# Patient Record
Sex: Female | Born: 1950
Health system: Southern US, Community
[De-identification: ages and names within clinical notes are randomized; demographics above are authoritative.]

## PROBLEM LIST (undated history)

## (undated) DIAGNOSIS — C73 Malignant neoplasm of thyroid gland: Secondary | ICD-10-CM

## (undated) DIAGNOSIS — I82409 Acute embolism and thrombosis of unspecified deep veins of unspecified lower extremity: Secondary | ICD-10-CM

## (undated) DIAGNOSIS — E890 Postprocedural hypothyroidism: Secondary | ICD-10-CM

## (undated) DIAGNOSIS — T4145XA Adverse effect of unspecified anesthetic, initial encounter: Secondary | ICD-10-CM

## (undated) DIAGNOSIS — I10 Essential (primary) hypertension: Secondary | ICD-10-CM

## (undated) DIAGNOSIS — M6281 Muscle weakness (generalized): Secondary | ICD-10-CM

## (undated) DIAGNOSIS — J1282 Pneumonia due to coronavirus disease 2019: Secondary | ICD-10-CM

## (undated) DIAGNOSIS — R001 Bradycardia, unspecified: Secondary | ICD-10-CM

## (undated) DIAGNOSIS — I2699 Other pulmonary embolism without acute cor pulmonale: Secondary | ICD-10-CM

## (undated) DIAGNOSIS — R609 Edema, unspecified: Secondary | ICD-10-CM

## (undated) DIAGNOSIS — R202 Paresthesia of skin: Secondary | ICD-10-CM

## (undated) DIAGNOSIS — M79609 Pain in unspecified limb: Secondary | ICD-10-CM

## (undated) DIAGNOSIS — G4733 Obstructive sleep apnea (adult) (pediatric): Principal | ICD-10-CM

## (undated) DIAGNOSIS — K5790 Diverticulosis of intestine, part unspecified, without perforation or abscess without bleeding: Secondary | ICD-10-CM

## (undated) DIAGNOSIS — U071 COVID-19: Secondary | ICD-10-CM

## (undated) DIAGNOSIS — K635 Polyp of colon: Secondary | ICD-10-CM

## (undated) DIAGNOSIS — T8859XA Other complications of anesthesia, initial encounter: Secondary | ICD-10-CM

## (undated) DIAGNOSIS — R200 Anesthesia of skin: Secondary | ICD-10-CM

## (undated) HISTORY — DX: Muscle weakness (generalized): M62.81

## (undated) HISTORY — DX: Postprocedural hypothyroidism: E89.0

## (undated) HISTORY — DX: Polyp of colon: K63.5

## (undated) HISTORY — PX: ANKLE SURGERY: SHX546

## (undated) HISTORY — DX: Pain in unspecified limb: M79.609

## (undated) HISTORY — DX: Acute embolism and thrombosis of unspecified deep veins of unspecified lower extremity: I82.409

## (undated) HISTORY — DX: Obstructive sleep apnea (adult) (pediatric): G47.33

## (undated) HISTORY — DX: Pneumonia due to coronavirus disease 2019: J12.82

## (undated) HISTORY — DX: COVID-19: U07.1

## (undated) HISTORY — DX: Anesthesia of skin: R20.0

## (undated) HISTORY — DX: Paresthesia of skin: R20.2

## (undated) HISTORY — DX: Malignant neoplasm of thyroid gland: C73

## (undated) HISTORY — PX: TOTAL THYROIDECTOMY: SHX2547

## (undated) HISTORY — DX: Essential (primary) hypertension: I10

## (undated) HISTORY — DX: Edema, unspecified: R60.9

## (undated) HISTORY — PX: KNEE SURGERY: SHX244

## (undated) HISTORY — DX: Other pulmonary embolism without acute cor pulmonale: I26.99

## (undated) HISTORY — PX: ABDOMINAL HYSTERECTOMY: SHX81

## (undated) HISTORY — PX: CERVICAL FUSION: SHX112

## (undated) HISTORY — DX: Diverticulosis of intestine, part unspecified, without perforation or abscess without bleeding: K57.90

---

## 1998-04-04 ENCOUNTER — Ambulatory Visit (HOSPITAL_COMMUNITY): Admission: RE | Admit: 1998-04-04 | Discharge: 1998-04-04 | Payer: Self-pay | Admitting: Family Medicine

## 1998-11-09 ENCOUNTER — Other Ambulatory Visit: Admission: RE | Admit: 1998-11-09 | Discharge: 1998-11-09 | Payer: Self-pay | Admitting: Family Medicine

## 1999-11-29 ENCOUNTER — Ambulatory Visit (HOSPITAL_COMMUNITY): Admission: RE | Admit: 1999-11-29 | Discharge: 1999-11-29 | Payer: Self-pay | Admitting: Family Medicine

## 1999-11-29 ENCOUNTER — Encounter: Payer: Self-pay | Admitting: Family Medicine

## 2000-06-29 ENCOUNTER — Ambulatory Visit (HOSPITAL_COMMUNITY): Admission: RE | Admit: 2000-06-29 | Discharge: 2000-06-29 | Payer: Self-pay | Admitting: Family Medicine

## 2000-06-29 ENCOUNTER — Encounter: Payer: Self-pay | Admitting: Family Medicine

## 2001-02-10 ENCOUNTER — Inpatient Hospital Stay (HOSPITAL_COMMUNITY): Admission: EM | Admit: 2001-02-10 | Discharge: 2001-02-11 | Payer: Self-pay | Admitting: Emergency Medicine

## 2001-02-10 ENCOUNTER — Encounter: Payer: Self-pay | Admitting: Emergency Medicine

## 2001-02-10 ENCOUNTER — Encounter: Payer: Self-pay | Admitting: Cardiology

## 2002-02-13 ENCOUNTER — Encounter: Payer: Self-pay | Admitting: Emergency Medicine

## 2002-02-13 ENCOUNTER — Emergency Department (HOSPITAL_COMMUNITY): Admission: EM | Admit: 2002-02-13 | Discharge: 2002-02-13 | Payer: Self-pay | Admitting: Emergency Medicine

## 2002-04-27 ENCOUNTER — Encounter (HOSPITAL_BASED_OUTPATIENT_CLINIC_OR_DEPARTMENT_OTHER): Payer: Self-pay | Admitting: General Surgery

## 2002-04-27 ENCOUNTER — Encounter: Admission: RE | Admit: 2002-04-27 | Discharge: 2002-04-27 | Payer: Self-pay | Admitting: General Surgery

## 2004-03-20 ENCOUNTER — Encounter: Admission: RE | Admit: 2004-03-20 | Discharge: 2004-03-20 | Payer: Self-pay | Admitting: Family Medicine

## 2004-05-21 ENCOUNTER — Encounter: Admission: RE | Admit: 2004-05-21 | Discharge: 2004-05-21 | Payer: Self-pay | Admitting: Orthopedic Surgery

## 2004-05-24 ENCOUNTER — Inpatient Hospital Stay (HOSPITAL_COMMUNITY): Admission: EM | Admit: 2004-05-24 | Discharge: 2004-05-25 | Payer: Self-pay | Admitting: Emergency Medicine

## 2004-07-06 ENCOUNTER — Ambulatory Visit: Payer: Self-pay

## 2004-12-31 LAB — HM COLONOSCOPY: HM Colonoscopy: NORMAL

## 2005-03-04 ENCOUNTER — Encounter: Admission: RE | Admit: 2005-03-04 | Discharge: 2005-03-04 | Payer: Self-pay | Admitting: Family Medicine

## 2005-04-23 ENCOUNTER — Encounter: Admission: RE | Admit: 2005-04-23 | Discharge: 2005-04-23 | Payer: Self-pay | Admitting: Neurosurgery

## 2005-05-08 ENCOUNTER — Encounter: Admission: RE | Admit: 2005-05-08 | Discharge: 2005-08-06 | Payer: Self-pay | Admitting: Neurosurgery

## 2005-08-13 ENCOUNTER — Emergency Department (HOSPITAL_COMMUNITY): Admission: EM | Admit: 2005-08-13 | Discharge: 2005-08-14 | Payer: Self-pay | Admitting: Emergency Medicine

## 2006-01-02 ENCOUNTER — Encounter: Admission: RE | Admit: 2006-01-02 | Discharge: 2006-01-02 | Payer: Self-pay | Admitting: Family Medicine

## 2006-04-10 ENCOUNTER — Encounter: Admission: RE | Admit: 2006-04-10 | Discharge: 2006-04-10 | Payer: Self-pay | Admitting: Orthopedic Surgery

## 2006-05-13 ENCOUNTER — Ambulatory Visit: Admission: RE | Admit: 2006-05-13 | Discharge: 2006-05-13 | Payer: Self-pay | Admitting: Orthopedic Surgery

## 2006-05-13 ENCOUNTER — Encounter: Payer: Self-pay | Admitting: Vascular Surgery

## 2007-01-19 ENCOUNTER — Encounter: Admission: RE | Admit: 2007-01-19 | Discharge: 2007-01-19 | Payer: Self-pay | Admitting: Family Medicine

## 2007-11-07 ENCOUNTER — Emergency Department (HOSPITAL_COMMUNITY): Admission: EM | Admit: 2007-11-07 | Discharge: 2007-11-08 | Payer: Self-pay | Admitting: Emergency Medicine

## 2007-11-12 ENCOUNTER — Encounter: Admission: RE | Admit: 2007-11-12 | Discharge: 2007-11-12 | Payer: Self-pay | Admitting: Family Medicine

## 2007-11-23 ENCOUNTER — Encounter: Payer: Self-pay | Admitting: Pulmonary Disease

## 2007-12-25 ENCOUNTER — Ambulatory Visit: Payer: Self-pay | Admitting: Pulmonary Disease

## 2007-12-25 DIAGNOSIS — R059 Cough, unspecified: Secondary | ICD-10-CM | POA: Insufficient documentation

## 2007-12-25 DIAGNOSIS — R05 Cough: Secondary | ICD-10-CM

## 2007-12-25 DIAGNOSIS — R0600 Dyspnea, unspecified: Secondary | ICD-10-CM | POA: Insufficient documentation

## 2007-12-25 DIAGNOSIS — R0602 Shortness of breath: Secondary | ICD-10-CM

## 2007-12-25 DIAGNOSIS — I10 Essential (primary) hypertension: Secondary | ICD-10-CM

## 2007-12-25 HISTORY — DX: Essential (primary) hypertension: I10

## 2007-12-25 HISTORY — DX: Cough, unspecified: R05.9

## 2007-12-28 ENCOUNTER — Ambulatory Visit: Payer: Self-pay | Admitting: Cardiovascular Disease

## 2007-12-28 ENCOUNTER — Ambulatory Visit: Payer: Self-pay | Admitting: Pulmonary Disease

## 2007-12-28 ENCOUNTER — Telehealth (INDEPENDENT_AMBULATORY_CARE_PROVIDER_SITE_OTHER): Payer: Self-pay | Admitting: *Deleted

## 2007-12-28 LAB — CONVERTED CEMR LAB
GFR calc Af Amer: 83 mL/min
GFR calc non Af Amer: 69 mL/min
Potassium: 4.2 meq/L (ref 3.5–5.1)
Sodium: 143 meq/L (ref 135–145)

## 2007-12-31 ENCOUNTER — Ambulatory Visit: Payer: Self-pay | Admitting: Pulmonary Disease

## 2008-01-01 ENCOUNTER — Ambulatory Visit (HOSPITAL_COMMUNITY): Admission: RE | Admit: 2008-01-01 | Discharge: 2008-01-01 | Payer: Self-pay | Admitting: Pulmonary Disease

## 2008-01-12 ENCOUNTER — Encounter: Payer: Self-pay | Admitting: Pulmonary Disease

## 2008-01-21 ENCOUNTER — Encounter: Payer: Self-pay | Admitting: Pulmonary Disease

## 2008-01-21 ENCOUNTER — Encounter: Admission: RE | Admit: 2008-01-21 | Discharge: 2008-01-21 | Payer: Self-pay | Admitting: Pulmonary Disease

## 2008-01-21 ENCOUNTER — Encounter (INDEPENDENT_AMBULATORY_CARE_PROVIDER_SITE_OTHER): Payer: Self-pay | Admitting: Interventional Radiology

## 2008-01-21 ENCOUNTER — Other Ambulatory Visit: Admission: RE | Admit: 2008-01-21 | Discharge: 2008-01-21 | Payer: Self-pay | Admitting: Interventional Radiology

## 2008-01-22 ENCOUNTER — Ambulatory Visit: Payer: Self-pay | Admitting: Cardiovascular Disease

## 2008-01-29 ENCOUNTER — Telehealth: Payer: Self-pay | Admitting: Pulmonary Disease

## 2008-02-02 ENCOUNTER — Ambulatory Visit: Payer: Self-pay

## 2008-02-02 ENCOUNTER — Encounter: Payer: Self-pay | Admitting: Cardiovascular Disease

## 2008-04-12 ENCOUNTER — Ambulatory Visit (HOSPITAL_COMMUNITY): Admission: RE | Admit: 2008-04-12 | Discharge: 2008-04-13 | Payer: Self-pay | Admitting: Surgery

## 2008-04-12 ENCOUNTER — Encounter (INDEPENDENT_AMBULATORY_CARE_PROVIDER_SITE_OTHER): Payer: Self-pay | Admitting: Surgery

## 2008-04-25 ENCOUNTER — Encounter: Payer: Self-pay | Admitting: Endocrinology

## 2008-05-04 ENCOUNTER — Ambulatory Visit: Payer: Self-pay | Admitting: Endocrinology

## 2008-05-04 DIAGNOSIS — C73 Malignant neoplasm of thyroid gland: Secondary | ICD-10-CM

## 2008-05-04 DIAGNOSIS — E890 Postprocedural hypothyroidism: Secondary | ICD-10-CM | POA: Insufficient documentation

## 2008-05-04 DIAGNOSIS — Z85850 Personal history of malignant neoplasm of thyroid: Secondary | ICD-10-CM | POA: Insufficient documentation

## 2008-05-04 HISTORY — DX: Postprocedural hypothyroidism: E89.0

## 2008-05-04 HISTORY — DX: Malignant neoplasm of thyroid gland: C73

## 2008-05-04 LAB — CONVERTED CEMR LAB: TSH: 35.38 microintl units/mL — ABNORMAL HIGH (ref 0.35–5.50)

## 2008-05-12 ENCOUNTER — Telehealth: Payer: Self-pay | Admitting: Endocrinology

## 2008-05-13 ENCOUNTER — Encounter (HOSPITAL_COMMUNITY): Admission: RE | Admit: 2008-05-13 | Discharge: 2008-05-13 | Payer: Self-pay | Admitting: Endocrinology

## 2008-05-16 ENCOUNTER — Telehealth: Payer: Self-pay | Admitting: Endocrinology

## 2008-05-23 ENCOUNTER — Ambulatory Visit: Payer: Self-pay | Admitting: Endocrinology

## 2008-05-23 ENCOUNTER — Telehealth (INDEPENDENT_AMBULATORY_CARE_PROVIDER_SITE_OTHER): Payer: Self-pay | Admitting: *Deleted

## 2008-05-23 DIAGNOSIS — R112 Nausea with vomiting, unspecified: Secondary | ICD-10-CM

## 2008-05-23 HISTORY — DX: Nausea with vomiting, unspecified: R11.2

## 2008-05-25 LAB — CONVERTED CEMR LAB
ALT: 25 units/L (ref 0–35)
Albumin: 4.1 g/dL (ref 3.5–5.2)
Amylase: 137 units/L — ABNORMAL HIGH (ref 27–131)
BUN: 15 mg/dL (ref 6–23)
Basophils Absolute: 0 10*3/uL (ref 0.0–0.1)
Calcium: 9.2 mg/dL (ref 8.4–10.5)
Chloride: 104 meq/L (ref 96–112)
Eosinophils Absolute: 0.2 10*3/uL (ref 0.0–0.7)
Eosinophils Relative: 3.1 % (ref 0.0–5.0)
HCT: 39.2 % (ref 36.0–46.0)
Lymphocytes Relative: 32.6 % (ref 12.0–46.0)
MCHC: 34.7 g/dL (ref 30.0–36.0)
MCV: 88.2 fL (ref 78.0–100.0)
Neutro Abs: 3.2 10*3/uL (ref 1.4–7.7)
Neutrophils Relative %: 54.9 % (ref 43.0–77.0)
Platelets: 237 10*3/uL (ref 150–400)
RDW: 14.6 % (ref 11.5–14.6)
Total Bilirubin: 0.8 mg/dL (ref 0.3–1.2)
Total Protein: 7.3 g/dL (ref 6.0–8.3)
WBC: 5.8 10*3/uL (ref 4.5–10.5)

## 2008-05-27 ENCOUNTER — Ambulatory Visit: Payer: Self-pay | Admitting: Pulmonary Disease

## 2008-06-07 ENCOUNTER — Telehealth (INDEPENDENT_AMBULATORY_CARE_PROVIDER_SITE_OTHER): Payer: Self-pay | Admitting: *Deleted

## 2008-06-15 ENCOUNTER — Ambulatory Visit: Payer: Self-pay | Admitting: Endocrinology

## 2008-07-15 ENCOUNTER — Encounter: Payer: Self-pay | Admitting: Endocrinology

## 2008-11-22 ENCOUNTER — Ambulatory Visit: Payer: Self-pay | Admitting: Endocrinology

## 2008-11-22 LAB — CONVERTED CEMR LAB: TSH: 0.05 microintl units/mL — ABNORMAL LOW (ref 0.35–5.50)

## 2008-11-25 ENCOUNTER — Telehealth: Payer: Self-pay | Admitting: Endocrinology

## 2008-12-05 DIAGNOSIS — R209 Unspecified disturbances of skin sensation: Secondary | ICD-10-CM | POA: Insufficient documentation

## 2008-12-05 HISTORY — DX: Unspecified disturbances of skin sensation: R20.9

## 2008-12-08 ENCOUNTER — Telehealth (INDEPENDENT_AMBULATORY_CARE_PROVIDER_SITE_OTHER): Payer: Self-pay | Admitting: *Deleted

## 2008-12-09 ENCOUNTER — Ambulatory Visit: Payer: Self-pay

## 2008-12-09 ENCOUNTER — Ambulatory Visit: Payer: Self-pay | Admitting: Internal Medicine

## 2008-12-09 DIAGNOSIS — M79609 Pain in unspecified limb: Secondary | ICD-10-CM

## 2008-12-09 HISTORY — DX: Pain in unspecified limb: M79.609

## 2008-12-14 ENCOUNTER — Ambulatory Visit: Payer: Self-pay | Admitting: Endocrinology

## 2008-12-22 DIAGNOSIS — R609 Edema, unspecified: Secondary | ICD-10-CM

## 2008-12-22 DIAGNOSIS — R600 Localized edema: Secondary | ICD-10-CM

## 2008-12-22 HISTORY — DX: Edema, unspecified: R60.9

## 2008-12-22 HISTORY — DX: Localized edema: R60.0

## 2009-01-06 ENCOUNTER — Ambulatory Visit: Payer: Self-pay | Admitting: Endocrinology

## 2009-01-07 LAB — CONVERTED CEMR LAB: TSH: 38.29 microintl units/mL — ABNORMAL HIGH (ref 0.35–5.50)

## 2009-01-09 ENCOUNTER — Encounter (HOSPITAL_COMMUNITY): Admission: RE | Admit: 2009-01-09 | Discharge: 2009-03-01 | Payer: Self-pay | Admitting: Endocrinology

## 2009-01-20 ENCOUNTER — Telehealth (INDEPENDENT_AMBULATORY_CARE_PROVIDER_SITE_OTHER): Payer: Self-pay | Admitting: *Deleted

## 2009-01-21 ENCOUNTER — Ambulatory Visit: Payer: Self-pay | Admitting: Family Medicine

## 2009-01-21 DIAGNOSIS — J309 Allergic rhinitis, unspecified: Secondary | ICD-10-CM | POA: Insufficient documentation

## 2009-02-07 ENCOUNTER — Telehealth (INDEPENDENT_AMBULATORY_CARE_PROVIDER_SITE_OTHER): Payer: Self-pay | Admitting: *Deleted

## 2009-02-07 ENCOUNTER — Ambulatory Visit: Payer: Self-pay | Admitting: Endocrinology

## 2009-03-16 ENCOUNTER — Telehealth: Payer: Self-pay | Admitting: Endocrinology

## 2009-12-14 ENCOUNTER — Ambulatory Visit: Payer: Self-pay | Admitting: Endocrinology

## 2009-12-14 LAB — CONVERTED CEMR LAB: TSH: 0.48 microintl units/mL (ref 0.35–5.50)

## 2010-01-17 ENCOUNTER — Encounter: Payer: Self-pay | Admitting: Endocrinology

## 2010-02-16 ENCOUNTER — Telehealth: Payer: Self-pay | Admitting: Endocrinology

## 2010-02-16 ENCOUNTER — Ambulatory Visit: Payer: Self-pay | Admitting: Endocrinology

## 2010-02-19 ENCOUNTER — Telehealth: Payer: Self-pay | Admitting: Endocrinology

## 2010-02-20 ENCOUNTER — Ambulatory Visit: Payer: Self-pay | Admitting: Endocrinology

## 2010-02-20 LAB — CONVERTED CEMR LAB: TSH: 0.13 microintl units/mL — ABNORMAL LOW (ref 0.35–5.50)

## 2010-05-18 ENCOUNTER — Encounter: Admission: RE | Admit: 2010-05-18 | Discharge: 2010-05-18 | Payer: Self-pay | Admitting: Internal Medicine

## 2010-09-23 ENCOUNTER — Encounter: Payer: Self-pay | Admitting: Orthopedic Surgery

## 2010-10-02 NOTE — Assessment & Plan Note (Signed)
Summary: FU D/T--- STC   Vital Signs:  Patient profile:   60 year old female Height:      69 inches (175.26 cm) Weight:      274.38 pounds (124.72 kg) BMI:     40.67 O2 Sat:      94 % on Room air Temp:     97.0 degrees F (36.11 degrees C) oral Pulse rate:   74 / minute BP sitting:   122 / 62  (left arm) Cuff size:   large  Vitals Entered By: Josph Macho RMA (December 14, 2009 11:36 AM)  O2 Flow:  Room air CC: follow-up visit/ CF   Referring Provider:  Thayer Headings Primary Provider:  Pecola Leisure  CC:  follow-up visit/ CF.  History of Present Illness: pt says she never misses her synthroid 137 micrograms/day.  she has fatigue. pt says she has few weeks of a slight "sensation" in the anterior neck.  no associated pain.  Current Medications (verified): 1)  Cartia Xt 180 Mg  Cp24 (Diltiazem Hcl Coated Beads) .... Take 1 Tablet By Mouth Once A Day 2)  Omeprazole 40 Mg  Cpdr (Omeprazole) .... One Am and Pm. 3)  Benazepril-Hydrochlorothiazide 20-25 Mg Tabs (Benazepril-Hydrochlorothiazide) .... Take 1 By Mouth Qd 4)  Menopause Trio 40-500-100 Mg Cr-Tabs (Black Cohosh-Flaxseed-Soy) .... Take 2 By Mouth Qd 5)  The Very Finest Fish Oil  Liqd (Omega-3 Fatty Acids) .... Take Qd 6)  Zofran Odt 4 Mg Tbdp (Ondansetron) .Marland Kitchen.. 1 Q4h As Needed Nausea 7)  Levothyroxine Sodium 137 Mcg Tabs (Levothyroxine Sodium) .Marland Kitchen.. 1 Qd  Allergies (verified): 1)  ! Codeine  Past History:  Past Medical History: Last updated: 02/07/2009 DISTURBANCE OF SKIN SENSATION (ICD-782.0) LEG PAIN, LEFT (ICD-729.5) NAUSEA AND VOMITING (ICD-787.01) HYPOTHYROIDISM, POSTSURGICAL (ICD-244.0) CARCINOMA, THYROID GLAND, PAPILLARY (ICD-193, stage 2) 8/09: thyroidectomy for 2.7 cm papillary adenocarcinoma (t2 n0 mo) 9/09: i-131 rx, [facility] 5/10: tg is neg (ab neg) 5/10: total body scan is neg DYSPNEA (ICD-786.05) COUGH (ICD-786.2) HYPERTENSION (ICD-401.9)  Review of Systems       denies dysphagia and sob  Physical  Exam  General:  morbidly obese.   Neck:  several healed surgical scars are present.  i do not appreciate a nodule in the thyroid or elsewhere in the neck  Additional Exam:  Thyroglobulin Antibody           36.1 U/mL                   0.0-60.0 Thyroglobulin             <0.2 ng/mL  FastTSH                   0.48 uIU/mL    Impression & Recommendations:  Problem # 1:  HYPOTHYROIDISM, POSTSURGICAL (ICD-244.0) needs increased rx, in view of #3  Problem # 2:  DISTURBANCE OF SKIN SENSATION (ICD-782.0) uncertain etiology.  it is probably mild residual numbness from her surgery  Problem # 3:  CARCINOMA, THYROID GLAND, PAPILLARY (ICD-193) stage 2, no evidence of recurrence  Medications Added to Medication List This Visit: 1)  Levothyroxine Sodium 150 Mcg Tabs (Levothyroxine sodium) .Marland Kitchen.. 1 once daily  Other Orders: T-Thyroglobulin Panel (09811, [phone]) TLB-TSH (Thyroid Stimulating Hormone) (84443-TSH) Est. Patient Level IV (13086)  Patient Instructions: 1)  blood tests today. 2)  tests are being ordered for you today.  a few days after the test(s), please call [phone] to hear your test results. 3)  pending the test  results, please continue the same medications for now. 4)  return 1 year if blood tests are good. 5)  (update: i left message on phone-tree:  increase synthroid to 150/day). Prescriptions: LEVOTHYROXINE SODIUM 150 MCG TABS (LEVOTHYROXINE SODIUM) 1 once daily  #90 x 3   Entered and Authorized by:   Minus Breeding MD   Signed by:   Minus Breeding MD on 12/15/2009   Method used:   Electronically to        [facility]  [address] [phone]* (retail)       [address]       [place], [place]  [id number]       Ph: [phone] or [phone]       Fax: [phone]   RxID:   [id number]

## 2010-10-02 NOTE — Progress Notes (Signed)
Summary: RESULTS  Phone Note Call from Patient   Summary of Call: Patient is requesting results of labs as soon as complete she feels that she needs change in dose.  Initial call taken by: Lamar Sprinkles, CMA,  February 16, 2010 3:08 PM  Follow-up for Phone Call        i'll leave result on phone-tree Follow-up by: Minus Breeding MD,  February 16, 2010 3:14 PM  Additional Follow-up for Phone Call Additional follow up Details #1::        Pt informed, she is very anxious to get results and new rx if indicated. Additional Follow-up by: Lamar Sprinkles, CMA,  February 16, 2010 5:18 PM    Additional Follow-up for Phone Call Additional follow up Details #2::    i left message on phone tree please continue same synthroid Follow-up by: Minus Breeding MD,  February 17, 2010 10:22 AM

## 2010-10-02 NOTE — Progress Notes (Signed)
Summary: Lab?  Phone Note Call from Patient Call back at Home Phone [phone]   Caller: Patient Summary of Call: pt informed to continue same thyroid medication but she states that she feels very tired and sluggish. Pt is requesting MD look recheck lab results for any possible indication that adjustment is needed. Initial call taken by: Margaret Pyle, CMA,  February 19, 2010 3:53 PM  Follow-up for Phone Call        ok tsh 244.0 Follow-up by: Minus Breeding MD,  February 19, 2010 3:54 PM  Additional Follow-up for Phone Call Additional follow up Details #1::        pt informed and labs scheduled for 06.21. Additional Follow-up by: Margaret Pyle, CMA,  February 19, 2010 4:34 PM

## 2010-10-02 NOTE — Progress Notes (Signed)
Summary: TSH?  Phone Note Call from Patient Call back at Home Phone [phone]   Caller: Patient Summary of Call: Pt called stating that she has been feeling tired and sluggish since thyroid medication dosage was changed. Pt is requesting another blood test if appropriate or an adjustment or medication? Please advise, OV, Labs? Initial call taken by: Margaret Pyle, CMA,  February 16, 2010 12:09 PM  Follow-up for Phone Call        ok 244.0 Follow-up by: Minus Breeding MD,  February 16, 2010 12:43 PM  Additional Follow-up for Phone Call Additional follow up Details #1::        Pt informed, please put lab in idx for today, thanks a bunch Additional Follow-up by: Lamar Sprinkles, CMA,  February 16, 2010 2:22 PM    Additional Follow-up for Phone Call Additional follow up Details #2::    I put order for TSH in ID[facility], is that all? Follow-up by: Verdell Face,  February 16, 2010 2:37 PM  Additional Follow-up for Phone Call Additional follow up Details #3:: Details for Additional Follow-up Action Taken: Yup thanks Additional Follow-up by: Lamar Sprinkles, CMA,  February 16, 2010 3:07 PM

## 2010-10-05 NOTE — Letter (Signed)
Summary: [facility]   Imported By: Lester Newville 01/31/2010 08:23:53  _____________________________________________________________________  External Attachment:    Type:   Image     Comment:   External Document

## 2010-12-26 ENCOUNTER — Other Ambulatory Visit: Payer: Self-pay | Admitting: Endocrinology

## 2011-01-02 DIAGNOSIS — R209 Unspecified disturbances of skin sensation: Secondary | ICD-10-CM

## 2011-01-03 ENCOUNTER — Ambulatory Visit (INDEPENDENT_AMBULATORY_CARE_PROVIDER_SITE_OTHER): Payer: BC Managed Care – PPO | Admitting: Endocrinology

## 2011-01-03 ENCOUNTER — Other Ambulatory Visit (INDEPENDENT_AMBULATORY_CARE_PROVIDER_SITE_OTHER): Payer: BC Managed Care – PPO

## 2011-01-03 ENCOUNTER — Encounter: Payer: Self-pay | Admitting: Endocrinology

## 2011-01-03 DIAGNOSIS — E890 Postprocedural hypothyroidism: Secondary | ICD-10-CM

## 2011-01-03 DIAGNOSIS — C73 Malignant neoplasm of thyroid gland: Secondary | ICD-10-CM

## 2011-01-03 MED ORDER — LEVOTHYROXINE SODIUM 137 MCG PO TABS: 137.0000 ug | ORAL_TABLET | Freq: Every day | ORAL | Status: DC

## 2011-01-03 NOTE — Progress Notes (Signed)
  Subjective:    Patient ID: Natasha [name], female    DOB: 02-09-1951, 60 y.o.   MRN: [id number]  HPI Pt is here to f/u stage-2 papillary adenocarcinoma of the thyroid. 8/09: thyroidectomy for 2.7 cm papillary adenocarcinoma (T2 N0 M0) 9/09: i-131 rx, [facility] 5/10: tg is neg (ab neg) 5/10: total body scan is neg 4/11: tg neg (ab nl) She takes synthroid as rx'ed.  pt states she feels well in general, except for weight gain.   Past Medical History  Diagnosis Date  . HYPOTHYROIDISM, POSTSURGICAL 05/04/2008  . 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 HYPERTENSION 12/25/2007  . Edema 12/22/2008  . LEG PAIN, LEFT 12/09/2008    Past Surgical History  Procedure Date  . Cervical fusion     x 2  . Abdominal hysterectomy   . Knee surgery   . Ankle surgery   . Total thyroidectomy     History   Social History  . Marital Status: Married    Spouse Name: N/A    Number of Children: N/A  . Years of Education: N/A   Occupational History  . Teacher    Social History Main Topics  . Smoking status: Never Smoker   . Smokeless tobacco: Not on file  . Alcohol Use:   . Drug Use:   . Sexually Active:    Other Topics Concern  . Not on file   Social History Narrative   Pt has children    Current Outpatient Prescriptions on File Prior to Visit  Medication Sig Dispense Refill  . benazepril-hydrochlorthiazide (LOTENSIN HCT) 20-25 MG per tablet Take 1 tablet by mouth daily.        Vedia Coffer Cohosh-Flaxseed-Soy (MENOPAUSE TRIO) 40-500-100 MG TBCR Take 2 tablets by mouth daily.        Marland Kitchen diltiazem (CARDIZEM CD) 180 MG 24 hr capsule Take 180 mg by mouth daily.        . Omega-3 Fatty Acids (THE VERY FINEST FISH OIL) LIQD Take by mouth daily.          Allergies  Allergen Reactions  . Codeine     No family history on file.  BP 108/80   Pulse 71  Temp(Src) 98.4 F (36.9 C) (Oral)  Ht 5\' 7"  (1.702 m)  Wt 288 lb 1.9 oz (130.69 kg)  BMI 45.13 kg/m2  SpO2 95%   Review of Systems Denies doe.      Objective:   Physical Exam GENERAL: no distress.  Obese. Neck: a healed scar is present.  i do not appreciate a nodule in the thyroid or elsewhere in the neck.    Lab Results  Component Value Date   TSH 0.88 01/03/2011   Tg: undetectable   Assessment & Plan:  Papillary adenocarcinoma of the thyroid, no evidence of recurrence.

## 2011-01-03 NOTE — Patient Instructions (Addendum)
Cc dr shelton blood tests are being ordered for you today.  please call [phone] to hear your test results.  You will be prompted to enter the 9-digit "MRN" number that appears at the top left of this page, followed by #.  Then you will hear the message. Please return in 1 year.   (update: i left message on phone-tree:  Increase synthroid to 137/d)

## 2011-01-04 LAB — THYROGLOBULIN LEVEL: Thyroglobulin Ab: <20 U/mL (ref <40.0)

## 2011-01-15 NOTE — Op Note (Signed)
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   POSTOPERATIVE DIAGNOSIS:  Right thyroid nodule with Hurthle cell  features and nuclear atypia.   PROCEDURE:  1. Total thyroidectomy.  2. Central compartment lymph node dissection (zone 6).   SURGEON:  Velora Heckler, MD, FACS   ASSISTANT:  Almond Lint, MD   ANESTHESIA:  General endotracheal per Dr. Sharee Holster.   ESTIMATED BLOOD LOSS:  Minimal.   PREPARATION:  Betadine.   COMPLICATIONS:  None.   INDICATIONS:  The patient is a 60 year old black female from [place],  [place].  She had undergone workup of chronic cough by Dr. Marcelyn Bruins.  This included a CT scan of the chest.  Chest CT demonstrated a  large mass occupying the right thyroid lobe.  Subsequent ultrasound-  guided fine needle aspiration biopsy of a 5-cm right thyroid mass was  performed.  This showed Hurthle cell lesion with nuclear grooves and an  oncocytic variant of papillary carcinoma was within the differential  diagnosis.  The patient now comes to Surgery for resection for  definitive diagnosis.   BODY OF REPORT:  Procedure was done in OR #16 at the [facility].  The patient was brought to the operating room and  placed in a supine position on the operating room table.  Following  administration of general anesthesia, the patient was positioned and  then prepped and draped in the usual strict aseptic fashion.  After  ascertaining that an adequate level of anesthesia had been achieved, a  Kocher incision was made with a #15 blade.   Dissection was carried  through subcutaneous tissues and hemostasis was obtained with  electrocautery.  Skin flaps were elevated cephalad and caudad from the  thyroid notch to the sternal notch.  A Mahorner self-retaining retractor  was placed for exposure.  Strap muscles were incised in the midline and  dissection was carried down to the thyroid.  Dissection was begun  initially on the left side.  Strap muscles were reflected laterally.  The left thyroid lobe was exposed.  It was gently dissected out.  Venous  tributaries were divided between medium Ligaclips with the harmonic  scalpel.  Superior pole vessels were dissected out, ligated in  continuity with 2-0 silk ties, and divided with the harmonic scalpel.  Superior parathyroid on the left was identified and preserved on its  vascular pedicle.  Gland was rolled anteriorly.  Branches of the  inferior thyroid artery were divided between small Ligaclips.  Gland was  rolled further anteriorly and the recurrent nerve was identified and  preserved.  Ligament of Allyson Sabal was transected with the  electrocautery and  the gland was rolled onto the anterior trachea.  There was a hard, firm,  fixed mass in the upper portion of the isthmus overlying the thyroid  cartilage.  This appears to be a papillary thyroid carcinoma.  It is  adherent to the underlying thyroid cartilage and to the cricothyroideus  muscles.  It is gently mobilized using the electrocautery for  hemostasis.  The medial portion of bilateral cricothyroideus muscles are  sacrificed in order to mobilize the tumor.  Tumor was mobilized off the  underlying trachea.   Next, we turned our attention to the right thyroid lobe.  Again, strap  muscles were reflected laterally.  Right lobe was markedly larger than  the left.  There was also extensive scarring between the carotid sheath  and the thyroid capsule consistent with prior surgery.  Gland was gently  mobilized and venous tributaries  were ligated in continuity with 2-0  silk ties and divided with the harmonic scalpel.  Superior pole vessels  were dissected out and divided between medium Ligaclips with the  harmonic scalpel.  Gland was rolled anteriorly.  Parathyroid tissue was  identified and preserved.  Inferior venous tributaries were ligated with  2-0 silk ties in continuity and divided with the harmonic scalpel.  Gland was rolled anteriorly and branches of the inferior thyroid artery  were divided between small and medium Ligaclips with the harmonic  scalpel.  Recurrent nerve was identified and preserved.  Ligament of  Allyson Sabal was transected with the electrocautery and the gland was mobilized  up and onto the trachea.  Remainder of the tumor in the isthmus was then  mobilized off the medial aspect of the right cricothyroideus muscle and  excised off the anterior trachea in its entirety.  The entire thyroid  specimen was then submitted to pathology with a suture marking the right  superior pole.   Good hemostasis was noted throughout the operative field.  Central  compartment zone 6 lymph nodes were then mobilized using the  electrocautery for dissection and hemostasis.  Venous tributaries were  divided between medium Ligaclips.  The entire central compartment lymph  node bearing tissue ranging from the right carotid to the left carotid  overlying the airway to the innominate inferiorly was excised.  It was  submitted as a separate specimen to pathology for review.  Neck was  irrigated with warm saline.  Good hemostasis was obtained with the  electrocautery.  Surgicel was placed in the operative field bilaterally  and in the anterior neck.  Strap muscles were closed in the midline with  interrupted 3-0 Vicryl sutures.  Platysma was closed with interrupted 3-  0 Vicryl sutures.  Skin was closed with running 4-0 Monocryl  subcuticular suture.  Wound was washed and dried and Benzoin and Steri-  Strips were applied.   Sterile dressings were applied.  The patient was  awakened from anesthesia and brought to the recovery room in stable  condition.  The patient tolerated the procedure well.      Velora Heckler, MD  Electronically Signed     TMG/MEDQ  D:  04/12/2008  T:  04/13/2008  Job:  [id number]   cc:   Barbaraann Share, MD,FCCP  Betti D. Pecola Leisure, M.D.  Noralyn Pick. Eden Emms, MD, [facility]  Suzanna Obey, M.D.

## 2011-01-15 NOTE — Assessment & Plan Note (Signed)
[facility]                                 ON-CALL NOTE   NAME:[name], [name]                         MRN:          [id number]  DATE:05/21/2008                            DOB:          09/09/1956    On May 21, 2008, at 8:10 p.m.   PHONE NUMBER:  [phone].   Dr. Everardo All is regular doctor and I am Dr. Milinda Antis on call.   CHIEF COMPLAINT:  Feeling bad.   The patient said she had a total thyroidectomy on April 12, 2008.  The  last Friday, she started radioactive iodine treatment.  She has had  nausea and weakness and general malaise ever since then.  Dr. Everardo All  had called her in on Phenergan for nausea which helps a little bit.  Now, she just feels like getting the shakes and the sweats.  She checked  her temperature.  She had no fever.  She denies cough, urinary symptoms,  abdominal pain or vomiting.  She said she is just feeling worse and  worse.  She notes that when she has her scan in the morning at [facility], that he will probably start her on thyroid medicine, and she  wanted to know if anything else could be done tonight.  I told her that  I was not sure if her symptoms were coming from the thyroid problem or  something else, but it is reassuring that she does not have a fever but  if her symptoms worsen today, she needs to go into the emergency room  for an evaluation, otherwise she will call the office first thing in the  morning to be evaluated.  I also urged her to call back if she has any  other questions or concerns.     Natasha A. Tower, MD  Electronically Signed    MAT/MedQ  DD: 05/22/2008  DT: 05/23/2008  Job #: [id number]

## 2011-01-15 NOTE — Assessment & Plan Note (Signed)
[facility]                            CARDIOLOGY OFFICE NOTE   NAME:Kofman, SARAJEAN DESSERT                       MRN:          [id number]  DATE:01/22/2008                            DOB:          May 13, 1951    PLAN:  Ms. Ange is a 60-year patient referred by Dr. Shelle Iron.  The  patient has been having an illness since February of 2009.  She has been  coughing.  She has had dyspnea.  She has been treated with 4 courses of  antibiotics.  There is apparently some thyromegaly with a recent thyroid  biopsy; the results were not available.   The patient had a CT scan which apparently showed dilated pulmonary  arteries.  She was supposed to have an echocardiogram before she saw me,  however, she did not.   In regard to the patient's dyspnea, I do not think it is cardiac  related.  She had a normal echo in 2005.  She had a heart cath by Dr.  Elsie Lincoln in 2002 which showed normal LV function with no elevation in  pressures and no coronary artery disease.  She has had minimal lower  extremity edema.  She does not any significant chest pain, palpitations  or syncope.   A lot of her shortness breath clearly is surrounding her neck area.  She  had a previous accident with 2 cervical spine procedures.  She has a  visible goiter and clearly has some issues with her vocal cords right  now.   As far as I can tell, she has not had any initiation of therapy with  steroids.   The patient has not had a history of murmur or congenital heart disease.  There has been no previous history of shunts.  Her echocardiogram back  in 2005 showed no evidence of an ASD, VSD or anomalous pulmonary veins.   PAST MEDICAL HISTORY:  Remarkable for dyspnea, coughing, hypertension, 2  previous cervical spine surgeries.  She has had previous ankle surgery  and previous hysterectomy.   The patient's review of systems is otherwise negative.   ALLERGIES:  SHE IS ALLERGIC TO CODEINE.   She is  on Micardis question dose, DynaCirc question previously  discontinued, omeprazole 40 a day, Cartia 180 a day.   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  She is married.  She has 3  children.  She is otherwise fairly sedentary and has not been able to do  very much lately due to her dyspnea.  She does not smoke and does not  drink.   FAMILY HISTORY:  Remarkable for kidney problems in the mother who died  at age 25.  Father is alive at age 49.   EXAM:  Remarkable for an overweight black female in no distress.  Weight  is 273, blood pressure is 140/92, pulse 70 and regular, afebrile,  respiratory rate 14.  She appears to have a bit of a goiter, right greater than left.  She has  had 2 previous anterior cervical surgeries.  No JVP elevation or carotid  bruits.  No lymphadenopathy.  Lungs are clear with good diaphragmatic motion.  No wheezing.  She was  coughing most of the time that I saw her.  Her voice is hoarse.  She  does appear to have possible silent aspiration or vocal cord problems.  There is an S1, S2.  Normal heart sounds.  PMI not palpable.  ABDOMEN:  Protuberant.  Bowel sounds positive.  No AAA.  No tenderness.  No hepatosplenomegaly or hepatojugular reflux.  No tenderness.  Distal pulses are intact, with trace edema.  NEURO:  Nonfocal.  SKIN:  Warm and dry.  No muscular weakness.   EKG has sinus rhythm with somewhat poor R-wave progression, no evidence  of cor pulmonale or LVH.   IMPRESSION:  1. The patient has dyspnea.  I doubt it is related to her heart.  I      will have to review her CT scan but I would find it hard to believe      that her pulmonary arteries represent pulmonary hypertension or an      atrial septal defect.  She clearly developed a viral illness in      February and has not recovered from this.  I will refer her to ENT      to Dr. Link Snuffer to further assess her airway and vocal      cords.  She will follow with Dr. Shelle Iron for further  workup.  2. Dyspnea.  Check 2D echocardiogram.  Rule out pulmonary      hypertension, atrial septal defect.  We will do this with a bubble      study, reassess left ventricular function.  3. Hypertension appears to be somewhat suboptimally controlled.  The      Micardis should not be causing a cough.  Consider adding      hydrochlorothiazide to the Micardis given her lower extremity      edema.  4. Lower extremity edema, dependent, secondary to central obesity.      Consider adding diuretic.   So long as the patient's echo with bubble study is normal, I do not  think she needs further workup.     Noralyn Pick. Eden Emms, MD, [facility]  Electronically Signed    PCN/MedQ  DD: 01/22/2008  DT: 01/22/2008  Job #: [id number]   cc:   Barbaraann Share, MD,FCCP

## 2011-01-18 NOTE — Cardiovascular Report (Signed)
[place]  Patient:    Natasha [name], Natasha [name]                       MRN: [id number] Proc. Date: 02/11/01 Adm. Date:  [id number] Attending:  Ophelia Shoulder CC:         Betti D. Pecola Leisure, M.D.             Cath Lab                        Cardiac Catheterization  PROCEDURE:  Selective coronary angiography by Judkins technique.  Retrograde left heart catheterization.  Left ventricular angiography.  COMPLICATIONS:  None.  ENTRY SITE:  Right femoral artery.  DYE USED:  Omnipaque.  INDICATIONS:  The patient is a 60 year old African-American married female with a history of chest pain for the last several months that was worse today. It is a pressure-like sensation that sounds anginal.  The patients risk factors include a history of hypertension and obesity.  Todays cardiac catheterization was performed on an inpatient basis after EKGs had failed to show any evidence of ischemia and cardiac enzymes were negative.  RESULTS:  The left ventricular pressure was 135/20, central aortic pressure 135/85, mean of 105.  No aortic valve gradient by pullback technique.  ANGIOGRAPHIC RESULTS:  The left main coronary artery was normal.  The left anterior descending coronary artery courses through the cardiac apex and gives rise to one diagonal branch, no lesions were seen.  The left circumflex is nondominant.  The distal runoff of the circumflex is very tortuous, but no lesions were seen.  The right coronary artery is large and dominant with no lesions.  The left ventricle shows normal contractility with no evidence of mitral regurgitation, mitral prolapse, or LV thrombus.  FINAL DIAGNOSES: 1. Angiographic patent coronary arteries. 2. Normal left ventricular systolic function.  PLAN:  Reassurance. DD:  02/11/01 TD:  02/11/01 Job: [id number]
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
## 2011-05-08 ENCOUNTER — Other Ambulatory Visit: Payer: Self-pay | Admitting: Internal Medicine

## 2011-05-08 DIAGNOSIS — R1310 Dysphagia, unspecified: Secondary | ICD-10-CM

## 2011-05-10 ENCOUNTER — Ambulatory Visit
Admission: RE | Admit: 2011-05-10 | Discharge: 2011-05-10 | Disposition: A | Discharge status: Home / Self Care | Payer: BC Managed Care – PPO | Source: Ambulatory Visit | Attending: Internal Medicine | Admitting: Internal Medicine

## 2011-05-10 DIAGNOSIS — R1310 Dysphagia, unspecified: Secondary | ICD-10-CM

## 2011-05-16 ENCOUNTER — Other Ambulatory Visit: Payer: Self-pay | Admitting: Internal Medicine

## 2011-05-16 DIAGNOSIS — R221 Localized swelling, mass and lump, neck: Secondary | ICD-10-CM

## 2011-05-31 LAB — CBC
Hemoglobin: 13.4
MCHC: 33.1
MCV: 89.1
RBC: 4.55

## 2011-05-31 LAB — COMPREHENSIVE METABOLIC PANEL
CO2: 27
Calcium: 9.7
Creatinine, Ser: 0.92
GFR calc Af Amer: >60
GFR calc non Af Amer: >60
Glucose, Bld: 92

## 2011-05-31 LAB — DIFFERENTIAL
Lymphocytes Relative: 31
Lymphs Abs: 2.1
Neutro Abs: 3.7
Neutrophils Relative %: 54

## 2011-05-31 LAB — URINE MICROSCOPIC-ADD ON

## 2011-05-31 LAB — URINALYSIS, ROUTINE W REFLEX MICROSCOPIC
Bilirubin Urine: NEGATIVE
Nitrite: NEGATIVE
Specific Gravity, Urine: 1.009
Urobilinogen, UA: 0.2

## 2011-05-31 LAB — PROTIME-INR
INR: 0.9
Prothrombin Time: 12.6

## 2011-11-19 DIAGNOSIS — G959 Disease of spinal cord, unspecified: Secondary | ICD-10-CM | POA: Insufficient documentation

## 2011-12-04 DIAGNOSIS — M5432 Sciatica, left side: Secondary | ICD-10-CM

## 2011-12-04 HISTORY — DX: Sciatica, left side: M54.32

## 2012-11-13 ENCOUNTER — Other Ambulatory Visit: Payer: Self-pay | Admitting: Internal Medicine

## 2013-02-01 ENCOUNTER — Encounter (INDEPENDENT_AMBULATORY_CARE_PROVIDER_SITE_OTHER): Payer: Self-pay

## 2013-02-17 ENCOUNTER — Ambulatory Visit (INDEPENDENT_AMBULATORY_CARE_PROVIDER_SITE_OTHER): Payer: BC Managed Care – PPO | Admitting: Surgery

## 2013-02-17 ENCOUNTER — Encounter (INDEPENDENT_AMBULATORY_CARE_PROVIDER_SITE_OTHER): Payer: Self-pay | Admitting: Surgery

## 2013-02-17 ENCOUNTER — Other Ambulatory Visit (INDEPENDENT_AMBULATORY_CARE_PROVIDER_SITE_OTHER): Payer: Self-pay | Admitting: Surgery

## 2013-02-17 VITALS — BP 144/88 | HR 68 | Temp 97.6°F | Resp 16 | Ht 68.5 in | Wt 289.2 lb

## 2013-02-17 DIAGNOSIS — C73 Malignant neoplasm of thyroid gland: Secondary | ICD-10-CM

## 2013-02-17 NOTE — Progress Notes (Signed)
General Surgery [facility]  Chief Complaint  Patient presents with  . New Evaluation    thyroidectomy 2009 for follicular variant of papillary thyroid carcinoma - referral from Dr. Andi Devon    HISTORY: Patient is a 62 year old female known to my practice from previous total thyroidectomy with limited lymph node dissection for follicular variant of papillary thyroid carcinoma. This was performed in August of 2009. She subsequently received adjuvant radioactive iodine treatment. She has been on Synthroid since surgery. She is currently taking Synthroid 125 mcg daily under the direction of her primary care physician. Patient was under the care of and endocrinologist until 2012. Follow-up studies included a total body iodine scan which showed no evidence of metastatic disease. Patient had an ultrasound of the neck and 2012 which showed no residual or recurrent disease. She was then lost to follow-up.  The patient has developed some minor soreness in the neck. She notes an occasional wheeze. She has noted some loss in her voice strength, particularly with prolonged speaking. She is referred at this time for evaluation.  Past Medical History  Diagnosis Date  . HYPOTHYROIDISM, POSTSURGICAL 05/04/2008  . 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 HYPERTENSION 12/25/2007  . Edema 12/22/2008  . LEG PAIN, LEFT 12/09/2008     Current Outpatient Prescriptions  Medication Sig Dispense Refill  . benazepril-hydrochlorthiazide (LOTENSIN HCT) 20-25 MG per tablet Take 1 tablet by mouth daily.        Marland Kitchen diltiazem (CARDIZEM CD) 180 MG 24 hr capsule Take 180 mg by mouth daily.        Marland Kitchen ibuprofen (ADVIL,MOTRIN) 800 MG tablet       . Omega-3 Fatty Acids (THE VERY FINEST FISH OIL) LIQD Take by mouth daily.        Marland Kitchen levothyroxine  (SYNTHROID, LEVOTHROID) 137 MCG tablet Take 137 mcg by mouth daily.  30 tablet  11   No current facility-administered medications for this visit.     Allergies  Allergen Reactions  . Codeine      No family history on file.   History   Social History  . Marital Status: Married    Spouse Name: N/A    Number of Children: N/A  . Years of Education: N/A   Occupational History  . Teacher    Social History Main Topics  . Smoking status: Never Smoker   . Smokeless tobacco: None  . Alcohol Use: No  . Drug Use: No  . Sexually Active: None   Other Topics Concern  . None   Social History Narrative   Pt has children     REVIEW OF SYSTEMS - PERTINENT POSITIVES ONLY: Voice fatigue, occasional soreness in the anterior neck. No tremor. No palpitation. No palpable masses. No compressive symptoms.  EXAM: Filed Vitals:   02/17/13 1002  BP: 144/88  Pulse: 68  Temp: 97.6 F (36.4 C)  Resp: 16    HEENT: normocephalic; pupils equal and reactive; sclerae clear; dentition good; mucous membranes moist NECK:  Well-healed surgical incisions in the anterior neck; palpation in the thyroid bed bilaterally shows no palpable mass or other abnormality; mild tenderness to palpation in the left neck; symmetric on extension; no palpable anterior or posterior cervical lymphadenopathy; no supraclavicular masses; no tenderness CHEST: clear to auscultation bilaterally without rales, rhonchi, or  wheezes CARDIAC: regular rate and rhythm without significant murmur; peripheral pulses are full EXT:  non-tender; no deformity; Moderate edema at the ankles bilaterally NEURO: no gross focal deficits; no sign of tremor   LABORATORY RESULTS: See [facility] (CHL-Epic) for most recent results   RADIOLOGY RESULTS: See [facility] (CHL-Epic) for most recent results   IMPRESSION: Personal history of follicular variant of papillary thyroid carcinoma, no clinical evidence of recurrent  disease  PLAN: Patient and I discussed the above findings. We discussed the need for ongoing followup. I am going to order a thyroglobulin level. We will contact her with this result. Hopefully it is undetectable. If there has been any rise in her thyroglobulin level, we schedule a ultrasound examination of the neck to see if there is any residual or recurrent tissue.  Patient's other somatic complaints should be addressed with her primary care physician.  The patient speaking voice during our interview today is normal. If she has other voice concerns or concerns about voice fatigue with prolonged speaking, she may need evaluated at a more specialized clinic. I would recommend the voice disorders clinic at [facility].  Velora Heckler, MD, FACS General & Endocrine Surgery [facility]   Visit Diagnoses: 1. CARCINOMA, THYROID GLAND, PAPILLARY     Primary Care Physician: Romero Belling, MD

## 2013-02-17 NOTE — Patient Instructions (Signed)
Thyroglobulin (Tg) This is a test used to monitor treatment of some types of thyroid cancer and to detect recurrence. Less commonly, may be used to help determine the cause of hyperthyroidism. It is used once treatment for thyroid cancer has been completed, before and after radioactive iodine therapy, and at regular intervals to monitor for recurrence.  PREPARATION FOR TEST A blood sample is obtained by inserting a needle into a vein in the arm. NORMAL FINDINGS  Age: 0-11 months  Female: 0.6-5.5 ng/mL  Female: 0.5-5.5 ng/mL  Age: 1-62 years  Female: 0.6-50.1 ng/mL  Female: 0.5-52.1 ng/mL  Age: 12 years and older  Female: 0.5-53.0 ng/mL  Female: 0.5-43.0 ng/mL Ranges for normal findings may vary among different laboratories and hospitals. You should always check with your doctor after having lab work or other tests done to discuss the meaning of your test results and whether your values are considered within normal limits. MEANING OF TEST  Your caregiver will go over the test results with you and discuss the importance and meaning of your results, as well as treatment options and the need for additional tests if necessary. OBTAINING THE TEST RESULTS  It is your responsibility to obtain your test results. Ask the lab or department performing the test when and how you will get your results. Document Released: 09/21/2004 Document Revised: 11/11/2011 Document Reviewed: 07/31/2008 ExitCare Patient Information 2014 [employer].  

## 2013-02-18 LAB — THYROGLOBULIN ANTIBODY PANEL: Thyroperoxidase Ab SerPl-aCnc: 10.7 IU/mL (ref <35.0)

## 2013-02-19 ENCOUNTER — Telehealth (INDEPENDENT_AMBULATORY_CARE_PROVIDER_SITE_OTHER): Payer: Self-pay

## 2013-02-19 NOTE — Telephone Encounter (Signed)
Thyroglobulin result in epic and msg to Dr Gerrit Friends to review and advise pt f/u.

## 2013-02-23 ENCOUNTER — Telehealth (INDEPENDENT_AMBULATORY_CARE_PROVIDER_SITE_OTHER): Payer: Self-pay

## 2013-02-23 NOTE — Telephone Encounter (Signed)
Pt notified of lab result and f/u prn.

## 2013-02-23 NOTE — Telephone Encounter (Signed)
Message copied by Joanette Gula on Tue Feb 23, 2013  8:59 AM ------      Message from: Velora Heckler      Created: Mon Feb 22, 2013  5:15 PM       Thyroglobulin level is undectable.  No clinical evidence of recurrence of her thyroid cancer.            Please notify patient of result.            Return for surgical care as needed.            Velora Heckler, MD, [facility]      Office: [phone]             ------

## 2013-06-15 ENCOUNTER — Encounter: Payer: Self-pay | Admitting: Pulmonary Disease

## 2013-06-15 ENCOUNTER — Ambulatory Visit (INDEPENDENT_AMBULATORY_CARE_PROVIDER_SITE_OTHER): Payer: BC Managed Care – PPO | Admitting: Pulmonary Disease

## 2013-06-15 VITALS — BP 126/86 | HR 72 | Ht 68.0 in | Wt 293.0 lb

## 2013-06-15 DIAGNOSIS — R0683 Snoring: Secondary | ICD-10-CM

## 2013-06-15 DIAGNOSIS — R0609 Other forms of dyspnea: Secondary | ICD-10-CM

## 2013-06-15 DIAGNOSIS — G4733 Obstructive sleep apnea (adult) (pediatric): Secondary | ICD-10-CM | POA: Insufficient documentation

## 2013-06-15 HISTORY — DX: Obstructive sleep apnea (adult) (pediatric): G47.33

## 2013-06-15 NOTE — Patient Instructions (Signed)
Will arrange for home sleep study Will call to arrange for follow up after sleep study reviewed  

## 2013-06-15 NOTE — Progress Notes (Signed)
Chief Complaint  Patient presents with  . Sleep Consult    referred by [facility] for OSA. Epworth Score: 5.    History of Present Illness: Natasha [name] is a 62 y.o. female for evaluation of sleep problems.  She is followed by Dr. Jacinto Halim with cardiology for chest pain.  There was concern she could have sleep apnea.  She was referred for further assessment.  She does snore occasionally.  She has been told she stops breathing, and wakes up feeling choked sometimes.  She has trouble sleeping on her back, and her mouth gets dry at night.  She feels tired during the day, but has trouble taking naps.  She goes to sleep at 2 am.  She falls asleep after about 30 minutes.  She sleeps through the night usually.  She gets out of bed at 8 am.  She feels okay in the morning.  She denies morning headache.  She does not use anything to help her fall sleep or stay awake.  She denies sleep walking, sleep talking, bruxism, or nightmares.  There is no history of restless legs.  She denies sleep hallucinations, sleep paralysis, or cataplexy.  The Epworth score is 5 out of 24.   Natasha [name]  has a past medical history of HYPOTHYROIDISM, POSTSURGICAL (05/04/2008); CARCINOMA, THYROID GLAND, PAPILLARY (05/04/2008); HYPERTENSION (12/25/2007); Edema (12/22/2008); and LEG PAIN, LEFT (12/09/2008).  Natasha [name]  has past surgical history that includes Cervical fusion; Abdominal hysterectomy; Knee surgery; Ankle surgery; and Total thyroidectomy.  Prior to Admission medications   Medication Sig Start Date End Date Taking? Authorizing Provider  benazepril-hydrochlorthiazide (LOTENSIN HCT) 20-25 MG per tablet Take 1 tablet by mouth daily.     Yes Historical Provider, MD  diltiazem (CARDIZEM CD) 180 MG 24 hr capsule Take 180 mg by mouth daily.     Yes Historical Provider, MD  ibuprofen (ADVIL,MOTRIN) 800 MG tablet  12/17/12  Yes Historical Provider, MD  levothyroxine (SYNTHROID, LEVOTHROID) 125 MCG tablet Take 125 mcg by mouth  daily before breakfast.   Yes Historical Provider, MD  Omega-3 Fatty Acids (THE VERY FINEST FISH OIL) LIQD Take by mouth daily.     Yes Historical Provider, MD    Allergies  Allergen Reactions  . Codeine     Her family history includes Heart disease in her mother; Kidney disease in her mother.  She  reports that she has never smoked. She does not have any smokeless tobacco history on file. She reports that she does not drink alcohol or use illicit drugs.  Review of Systems  Constitutional: Positive for activity change. Negative for fever, chills, diaphoresis, appetite change, fatigue and unexpected weight change.  HENT: Negative for congestion, dental problem, ear discharge, ear pain, facial swelling, hearing loss, mouth sores, nosebleeds, postnasal drip, rhinorrhea, sinus pressure, sneezing, sore throat, tinnitus, trouble swallowing and voice change.   Eyes: Negative for photophobia, discharge, itching and visual disturbance.  Respiratory: Positive for chest tightness and shortness of breath. Negative for apnea, cough, choking, wheezing and stridor.   Cardiovascular: Positive for chest pain and palpitations. Negative for leg swelling.  Gastrointestinal: Negative for nausea, vomiting, abdominal pain, constipation, blood in stool and abdominal distention.  Genitourinary: Negative for dysuria, urgency, frequency, hematuria, flank pain, decreased urine volume and difficulty urinating.  Musculoskeletal: Negative for arthralgias, back pain, gait problem, joint swelling, myalgias, neck pain and neck stiffness.  Skin: Negative for color change, pallor and rash.  Neurological: Negative for dizziness, tremors, seizures, syncope, speech difficulty,  weakness, light-headedness, numbness and headaches.  Hematological: Negative for adenopathy. Does not bruise/bleed easily.  Psychiatric/Behavioral: Positive for sleep disturbance. Negative for confusion and agitation. The patient is nervous/anxious.     Physical Exam:  General - No distress ENT - No sinus tenderness, no oral exudate, no LAN, no thyromegaly, TM clear, pupils equal/reactive, MP 3, enlarged tongue Cardiac - s1s2 regular, no murmur, pulses symmetric Chest - No wheeze/rales/dullness, good air entry, normal respiratory excursion Back - No focal tenderness Abd - Soft, non-tender, no organomegaly, + bowel sounds Ext - 1+ ankle edema Neuro - Normal strength, cranial nerves intact Skin - No rashes Psych - Normal mood, and behavior

## 2013-06-15 NOTE — Progress Notes (Deleted)
  Subjective:    Patient ID: Natasha [name], female    DOB: Oct 01, 1950, 62 y.o.   MRN: [id number]  HPI    Review of Systems  Constitutional: Positive for activity change. Negative for fever, chills, diaphoresis, appetite change, fatigue and unexpected weight change.  HENT: Negative for congestion, dental problem, ear discharge, ear pain, facial swelling, hearing loss, mouth sores, nosebleeds, postnasal drip, rhinorrhea, sinus pressure, sneezing, sore throat, tinnitus, trouble swallowing and voice change.   Eyes: Negative for photophobia, discharge, itching and visual disturbance.  Respiratory: Positive for chest tightness and shortness of breath. Negative for apnea, cough, choking, wheezing and stridor.   Cardiovascular: Positive for chest pain and palpitations. Negative for leg swelling.  Gastrointestinal: Negative for nausea, vomiting, abdominal pain, constipation, blood in stool and abdominal distention.  Genitourinary: Negative for dysuria, urgency, frequency, hematuria, flank pain, decreased urine volume and difficulty urinating.  Musculoskeletal: Negative for arthralgias, back pain, gait problem, joint swelling, myalgias, neck pain and neck stiffness.  Skin: Negative for color change, pallor and rash.  Neurological: Negative for dizziness, tremors, seizures, syncope, speech difficulty, weakness, light-headedness, numbness and headaches.  Hematological: Negative for adenopathy. Does not bruise/bleed easily.  Psychiatric/Behavioral: Positive for sleep disturbance. Negative for confusion and agitation. The patient is nervous/anxious.        Objective:   Physical Exam        Assessment & Plan:

## 2013-06-15 NOTE — Assessment & Plan Note (Signed)
She has snoring, witnessed apnea, sleep disruption and daytime sleepiness. She has history of refractory hypertension.  She has BMI > 35.  I am concerned she could have sleep apnea.  We discussed how sleep apnea can affect various health problems including risks for hypertension, cardiovascular disease, and diabetes.  We also discussed how sleep disruption can increase risks for accident, such as while driving.  Weight loss as a means of improving sleep apnea was also reviewed.  Additional treatment options discussed were CPAP therapy, oral appliance, and surgical intervention.  To further assess will arrange for home sleep study, pending insurance approval.

## 2013-06-24 ENCOUNTER — Telehealth: Payer: Self-pay | Admitting: Pulmonary Disease

## 2013-06-24 NOTE — Telephone Encounter (Signed)
I spoke with pt. She is wanting to know if her home sleep study has been approved yet. According to notes it states waiting on VS to fill out insurance form. She reports she was in [place] and wanted to make sure she did not miss our call. Will forward to PCC's to ensure this is correct. Please advise thanks

## 2013-06-25 NOTE — Telephone Encounter (Signed)
Authorization Request faxed to [facility] on 06/15/13. Still haven't received approval or denial. Called [employer] and spoke with a Violeta Gelinas J at [employer] who stated that home study had been approved. Ref # [id number].  Contacted patient and advised her that the study was approved and scheduled patient to p/u the device on Thurs. 07/01/13. Pt is aware. Nothing else needed at this time. Rhonda J Cobb

## 2013-07-02 DIAGNOSIS — R0989 Other specified symptoms and signs involving the circulatory and respiratory systems: Secondary | ICD-10-CM

## 2013-07-02 DIAGNOSIS — R0609 Other forms of dyspnea: Secondary | ICD-10-CM

## 2013-07-23 ENCOUNTER — Telehealth: Payer: Self-pay | Admitting: Pulmonary Disease

## 2013-07-23 ENCOUNTER — Encounter: Payer: Self-pay | Admitting: Pulmonary Disease

## 2013-07-23 DIAGNOSIS — G4733 Obstructive sleep apnea (adult) (pediatric): Secondary | ICD-10-CM

## 2013-07-23 NOTE — Telephone Encounter (Signed)
lmtcb x1 

## 2013-07-23 NOTE — Telephone Encounter (Signed)
Spoke with the pt and notified of recs per VS She verbalized understanding  She agrees to set up CPAP  Order was sent to [facility]

## 2013-07-23 NOTE — Telephone Encounter (Signed)
HST 07/02/13 >> AHI 14.6, SaO2 low 80%.  Will have my nurse inform her that she has mild to moderate sleep apnea.   Options at this time are: 1) proceed with auto CPAP set up at home, and then schedule follow up several weeks after this, or 2) arrange for follow up first to discuss in more details.  If she is agreeable to CPAP set up, then send order for auto CPAP range 5 to 15 cm H2O with heated humidity and mask of choice.  Have download sent after two weeks of use, and schedule ROV in 8 weeks after CPAP set up.

## 2013-07-27 ENCOUNTER — Encounter: Payer: Self-pay | Admitting: Pulmonary Disease

## 2013-08-05 ENCOUNTER — Other Ambulatory Visit: Payer: Self-pay | Admitting: Gastroenterology

## 2013-08-05 DIAGNOSIS — R1310 Dysphagia, unspecified: Secondary | ICD-10-CM

## 2013-08-09 ENCOUNTER — Telehealth: Payer: Self-pay | Admitting: Endocrinology

## 2013-08-09 ENCOUNTER — Ambulatory Visit
Admission: RE | Admit: 2013-08-09 | Discharge: 2013-08-09 | Disposition: A | Discharge status: Home / Self Care | Payer: BC Managed Care – PPO | Source: Ambulatory Visit | Attending: Gastroenterology | Admitting: Gastroenterology

## 2013-08-09 DIAGNOSIS — R1310 Dysphagia, unspecified: Secondary | ICD-10-CM

## 2013-08-09 NOTE — Telephone Encounter (Signed)
Rec'd from [facility] forward 5 pages to Dr. Everardo All

## 2013-08-12 ENCOUNTER — Telehealth: Payer: Self-pay | Admitting: Endocrinology

## 2013-08-12 NOTE — Telephone Encounter (Signed)
Rec'd from [facility] forward 3 pages to Dr.Ellison °

## 2013-09-08 ENCOUNTER — Ambulatory Visit: Payer: BC Managed Care – PPO | Admitting: Endocrinology

## 2013-09-08 DIAGNOSIS — Z0289 Encounter for other administrative examinations: Secondary | ICD-10-CM

## 2013-09-15 ENCOUNTER — Ambulatory Visit: Payer: BC Managed Care – PPO | Admitting: Pulmonary Disease

## 2013-09-16 ENCOUNTER — Telehealth: Payer: Self-pay | Admitting: Pulmonary Disease

## 2013-09-16 NOTE — Telephone Encounter (Signed)
Order was sent [facility] 07/23/13.  Spoke with pt and she reports no one ever contacted her regarding getting set up on CPAP. I advised will call [facility] and see what is going on.  I called and spoke with Kim-from [facility].  She reports they spoke with pt and wanted to wait until after the holidays to get set up and would call them back when she was ready.  Maudie Mercury will give pt a call  I called and made pt aware as well. Nothing further needed

## 2013-11-08 ENCOUNTER — Telehealth: Payer: Self-pay | Admitting: Pulmonary Disease

## 2013-11-08 NOTE — Telephone Encounter (Signed)
Spoke with Lelon Frohlich at [facility] and she took message for Natasha [name] to call us back .  Need to clarify that they still have order for CPAP from 07/1013 and they can go ahead and get pt started.

## 2013-11-09 NOTE — Telephone Encounter (Signed)
I spoke with Maudie Mercury at [facility]-they have already got information for CPAP order and have attempted to contact the patient. They were unable to reach the patient at the home number-I gave Kim at [place] the patients cell number. They will contact patient to get her set up today.

## 2014-03-09 ENCOUNTER — Other Ambulatory Visit: Payer: Self-pay | Admitting: Physical Medicine and Rehabilitation

## 2014-03-09 DIAGNOSIS — M545 Low back pain, unspecified: Secondary | ICD-10-CM

## 2014-03-18 ENCOUNTER — Ambulatory Visit
Admission: RE | Admit: 2014-03-18 | Discharge: 2014-03-18 | Disposition: A | Discharge status: Home / Self Care | Payer: BC Managed Care – PPO | Source: Ambulatory Visit | Attending: Physical Medicine and Rehabilitation | Admitting: Physical Medicine and Rehabilitation

## 2014-03-18 DIAGNOSIS — M545 Low back pain, unspecified: Secondary | ICD-10-CM

## 2014-05-25 ENCOUNTER — Other Ambulatory Visit (HOSPITAL_COMMUNITY)
Admission: RE | Admit: 2014-05-25 | Discharge: 2014-05-25 | Disposition: A | Discharge status: Home / Self Care | Payer: BC Managed Care – PPO | Source: Ambulatory Visit | Attending: Family Medicine | Admitting: Family Medicine

## 2014-05-25 ENCOUNTER — Other Ambulatory Visit: Payer: BC Managed Care – PPO | Admitting: Family Medicine

## 2014-05-25 DIAGNOSIS — Z113 Encounter for screening for infections with a predominantly sexual mode of transmission: Secondary | ICD-10-CM | POA: Diagnosis not present

## 2014-05-25 DIAGNOSIS — N760 Acute vaginitis: Secondary | ICD-10-CM | POA: Diagnosis present

## 2014-06-02 LAB — URINE CYTOLOGY ANCILLARY ONLY
Bacterial vaginitis: POSITIVE — AB
CHLAMYDIA, DNA PROBE: NEGATIVE
Candida vaginitis: NEGATIVE
NEISSERIA GONORRHEA: NEGATIVE
Trichomonas: NEGATIVE

## 2015-06-16 ENCOUNTER — Other Ambulatory Visit: Payer: Self-pay | Admitting: Gastroenterology

## 2015-08-03 NOTE — Anesthesia Preprocedure Evaluation (Addendum)
Anesthesia Evaluation  Patient identified by MRN, date of birth, ID band Patient awake    Reviewed: Allergy & Precautions, NPO status , Patient's Chart, lab work & pertinent test results  Airway Mallampati: II   Neck ROM: Full    Dental  (+) Dental Advisory Given, Teeth Intact   Pulmonary neg pulmonary ROS, shortness of breath, sleep apnea ,    breath sounds clear to auscultation       Cardiovascular hypertension, negative cardio ROS   Rhythm:Regular  ECHO 2009 EF 60%   Neuro/Psych negative neurological ROS  negative psych ROS   GI/Hepatic negative GI ROS, Neg liver ROS,   Endo/Other  negative endocrine ROSHypothyroidism Morbid obesity  Renal/GU negative Renal ROS  negative genitourinary   Musculoskeletal negative musculoskeletal ROS (+)   Abdominal (+)  Abdomen: soft.    Peds negative pediatric ROS (+)  Hematology negative hematology ROS (+)   Anesthesia Other Findings   Reproductive/Obstetrics negative OB ROS                            Anesthesia Physical Anesthesia Plan  ASA: III  Anesthesia Plan: MAC   Post-op Pain Management:    Induction:   Airway Management Planned: Nasal Cannula  Additional Equipment:   Intra-op Plan:   Post-operative Plan:   Informed Consent: I have reviewed the patients History and Physical, chart, labs and discussed the procedure including the risks, benefits and alternatives for the proposed anesthesia with the patient or authorized representative who has indicated his/her understanding and acceptance.     Plan Discussed with:   Anesthesia Plan Comments:         Anesthesia Quick Evaluation

## 2015-08-08 ENCOUNTER — Encounter (HOSPITAL_COMMUNITY)
Admission: RE | Disposition: A | Discharge status: Home / Self Care | Payer: Self-pay | Source: Ambulatory Visit | Attending: Gastroenterology

## 2015-08-08 ENCOUNTER — Encounter (HOSPITAL_COMMUNITY): Payer: Self-pay | Admitting: *Deleted

## 2015-08-08 ENCOUNTER — Ambulatory Visit (HOSPITAL_COMMUNITY): Payer: BC Managed Care – PPO | Admitting: Anesthesiology

## 2015-08-08 ENCOUNTER — Ambulatory Visit (HOSPITAL_COMMUNITY)
Admission: RE | Admit: 2015-08-08 | Discharge: 2015-08-08 | Disposition: A | Discharge status: Home / Self Care | Payer: BC Managed Care – PPO | Source: Ambulatory Visit | Attending: Gastroenterology | Admitting: Gastroenterology

## 2015-08-08 DIAGNOSIS — Z1211 Encounter for screening for malignant neoplasm of colon: Secondary | ICD-10-CM | POA: Insufficient documentation

## 2015-08-08 DIAGNOSIS — Z6841 Body Mass Index (BMI) 40.0 and over, adult: Secondary | ICD-10-CM | POA: Diagnosis not present

## 2015-08-08 DIAGNOSIS — K648 Other hemorrhoids: Secondary | ICD-10-CM | POA: Insufficient documentation

## 2015-08-08 DIAGNOSIS — Z791 Long term (current) use of non-steroidal anti-inflammatories (NSAID): Secondary | ICD-10-CM | POA: Insufficient documentation

## 2015-08-08 DIAGNOSIS — K5730 Diverticulosis of large intestine without perforation or abscess without bleeding: Secondary | ICD-10-CM | POA: Diagnosis not present

## 2015-08-08 DIAGNOSIS — G4733 Obstructive sleep apnea (adult) (pediatric): Secondary | ICD-10-CM | POA: Insufficient documentation

## 2015-08-08 DIAGNOSIS — Z85850 Personal history of malignant neoplasm of thyroid: Secondary | ICD-10-CM | POA: Insufficient documentation

## 2015-08-08 DIAGNOSIS — D120 Benign neoplasm of cecum: Secondary | ICD-10-CM | POA: Diagnosis not present

## 2015-08-08 DIAGNOSIS — Z79899 Other long term (current) drug therapy: Secondary | ICD-10-CM | POA: Insufficient documentation

## 2015-08-08 DIAGNOSIS — I10 Essential (primary) hypertension: Secondary | ICD-10-CM | POA: Insufficient documentation

## 2015-08-08 DIAGNOSIS — G4730 Sleep apnea, unspecified: Secondary | ICD-10-CM | POA: Diagnosis present

## 2015-08-08 DIAGNOSIS — E039 Hypothyroidism, unspecified: Secondary | ICD-10-CM | POA: Insufficient documentation

## 2015-08-08 HISTORY — PX: COLONOSCOPY WITH PROPOFOL: SHX5780

## 2015-08-08 HISTORY — DX: Other complications of anesthesia, initial encounter: T88.59XA

## 2015-08-08 HISTORY — DX: Adverse effect of unspecified anesthetic, initial encounter: T41.45XA

## 2015-08-08 SURGERY — COLONOSCOPY WITH PROPOFOL
Anesthesia: Monitor Anesthesia Care

## 2015-08-08 MED ORDER — PROPOFOL 10 MG/ML IV BOLUS
INTRAVENOUS | Status: AC
  Filled 2015-08-08: qty 60

## 2015-08-08 MED ORDER — LACTATED RINGERS IV SOLN
INTRAVENOUS | Status: DC
  Administered 2015-08-08: 1000 mL via INTRAVENOUS

## 2015-08-08 MED ORDER — PROPOFOL 500 MG/50ML IV EMUL
INTRAVENOUS | Status: DC | PRN
  Administered 2015-08-08: 125 ug/kg/min via INTRAVENOUS

## 2015-08-08 MED ORDER — LIDOCAINE HCL (CARDIAC) 20 MG/ML IV SOLN
INTRAVENOUS | Status: DC | PRN
  Administered 2015-08-08: 50 mg via INTRAVENOUS

## 2015-08-08 MED ORDER — SODIUM CHLORIDE 0.9 % IV SOLN: INTRAVENOUS | Status: DC

## 2015-08-08 MED ORDER — LIDOCAINE HCL 1 % IJ SOLN
INTRAMUSCULAR | Status: AC
  Filled 2015-08-08: qty 20

## 2015-08-08 SURGICAL SUPPLY — 22 items

## 2015-08-08 NOTE — Op Note (Signed)
[facility] [place], [id number]   OPERATIVE PROCEDURE REPORT  PATIENT: Natasha [name], Natasha [name]  MR#: [id number] BIRTHDATE: 1951-06-13 GENDER: female ENDOSCOPIST: Edmonia James, MD ASSISTANT:   Marta Lamas, technician & Tory Emerald, RN. PROCEDURE DATE: 2015-08-21 PRE-PROCEDURE PREPARATION: Patient fasted for 4 hours prior to procedure. The patient was prepped with a gallon of Golytely the night prior to the procedure. PRE-PROCEDURE PHYSICAL: Patient has stable vital signs.  Neck is supple.  There is no JVD, thyromegaly or LAD.  Chest clear to auscultation.  S1 and S2 regular.  Abdomen soft, morbidly obese, non-distended, non-tender with NABS. PROCEDURE:     Colonoscopy with biopsy ASA CLASS:     Class III INDICATIONS:     1.  Colorectal cancer screening-average risk patient for colon cancer. MEDICATIONS:     Monitored anesthesia care.  DESCRIPTION OF PROCEDURE: After the risks, benefits, and alternatives of the procedure were thoroughly explained [including a 10% missed rate of cancer and polyps], informed consent was obtained.  Digital rectal exam was performed.  The Pentax Colonoscope [id number]  was introduced through the anus  and advanced to the cecum, which was identified by both the appendix and ileocecal valve , limited by No adverse events experienced.  The quality of the prep was adequate . Multiple washes were done. Small lesions could be missed. The instrument was then slowly withdrawn as the colon was fully examined. Estimated blood loss is zero unless otherwise noted in this procedure report.     COLON FINDINGS: Two dimunitive polyps were found in the mid-ascending colon and these were removed by cold biopsies x 3. There was mild diverticulosis noted in the sigmoid colon.  The rest of the colonic mucosa appeared healthy with a normal vascular pattern. No masses or AVMs were noted.  The appendiceal orifice and the ICV were identified and  photographed. Retroflexed views revealed small internal hemorrhoids. The patient tolerated the procedure without immediate complications. The scope was then withdrawn from the patient and the procedure terminated.   TIME TO CECUM:    03 minutes 00 seconds WITHDRAW TIME:  18 minutes 00 seconds  IMPRESSION:     1) Two dimunitive polyps were found in the mid-ascending colon-these were removed by cold biopsies x 3. 2) Few scattered diverticula in the sigmoid colon. 3) Small internal hemorrhoids.  RECOMMENDATIONS:     1.  Hold Aspirin and all other NSAIDS for 2 weeks. 2.  Await pathology results. 3.  Continue current medications. 4.  Continue surveillance. 5.  High fiber diet with liberal fluid intake. 6.  Office will call with the results.  REPEAT EXAM:      In 5-10 years  for a repeat colonoscopy, depending on the pathology results.  If the patient has any abnormal GI symptoms in the interim, she have been advised to contact the office as soon as possible for further recommendations.   REFERRED [id number] Criss Rosales, MD eSigned:  Edmonia James, MD 08/21/2015 8:10 AM  CPT CODES:     [phone] Colonoscopy, flexible, proximal to splenic flexure; with biopsy, single or multiple ICD CODES:     Z12.11 Encounter for screening for malignant neoplasm of colon K63.5 Colonic polyp K57.30 Diverticulosis  The ICD and CPT codes recommended by this software are interpretations from the data that the clinical staff has captured with the software.  The verification of the translation of this report to the ICD and CPT codes and modifiers is the sole responsibility of the  health care institution and practicing physician where this report was generated.  [employer] will not be held responsible for the validity of the ICD and CPT codes included on this report.  AMA assumes no liability for data contained or not contained herein. CPT is a Designer, television/film set of the [employer].  PATIENT NAME:  Natasha [name], Natasha [name] MR#: [id number]

## 2015-08-08 NOTE — H&P (Addendum)
Natasha [name] is an 64 y.o. female.   Chief Complaint: Colorectal cancer screening.  HPI: Patient is here for a screening colonoscopy. See office notes for details. She has a history of being a difficult intubation and has sleep apnea.  Past Medical History  Diagnosis Date  . HYPOTHYROIDISM, POSTSURGICAL 05/04/2008  . 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 HYPERTENSION 12/25/2007  . Edema 12/22/2008  . LEG PAIN, LEFT 12/09/2008  . OSA (obstructive sleep apnea) 06/15/2013  . Complication of anesthesia     trouble with airway   Past Surgical History  Procedure Laterality Date  . Cervical fusion      x 2  . Abdominal hysterectomy    . Knee surgery    . Ankle surgery    . Total thyroidectomy     Family History  Problem Relation Age of Onset  . Heart disease Mother   . Kidney disease Mother    Social History:  reports that she has never smoked. She does not have any smokeless tobacco history on file. She reports that she does not drink alcohol or use illicit drugs.  Allergies:  Allergies  Allergen Reactions  . Codeine Shortness Of Breath and Palpitations    Medications Prior to Admission  Medication Sig Dispense Refill  . benazepril-hydrochlorthiazide (LOTENSIN HCT) 20-25 MG per tablet Take 1 tablet by mouth daily.      Marland Kitchen diltiazem (CARDIZEM CD) 180 MG 24 hr capsule Take 180 mg by mouth daily.      Marland Kitchen ibuprofen (ADVIL,MOTRIN) 200 MG tablet Take 800 mg by mouth daily as needed (pain).    . nitrofurantoin, macrocrystal-monohydrate, (MACROBID) 100 MG capsule Take 100 mg by mouth daily.    . Omega-3 Fatty Acids (FISH OIL PO) Take 1 capsule by mouth daily.    Marland Kitchen OVER THE COUNTER MEDICATION Take 1 tablet by mouth daily. [employer] Cal-Mag-Vit.D    . SYNTHROID 112 MCG tablet Take 112 mcg by mouth daily.  3  . Multiple Vitamin  (MULTIVITAMIN WITH MINERALS) TABS tablet Take 1 tablet by mouth daily.     Review of Systems  Constitutional: Negative.   HENT: Negative.   Eyes: Negative.   Respiratory: Negative.   Cardiovascular: Negative.   Gastrointestinal: Positive for constipation.  Skin: Negative.   Neurological: Negative.   Endo/Heme/Allergies: Negative.   Psychiatric/Behavioral: Negative.    Blood pressure 116/70, pulse 63, temperature 98.8 F (37.1 C), temperature source Oral, resp. rate 10, height 5\' 8"  (1.727 m), weight 126.1 kg (278 lb), SpO2 96 %. Physical Exam  Constitutional: She is oriented to person, place, and time. She appears well-developed and well-nourished.  HENT:  Head: Normocephalic and atraumatic.  Eyes: Conjunctivae are normal. Pupils are equal, round, and reactive to light.  Neck: Normal range of motion. Neck supple.  Cardiovascular: Normal rate and regular rhythm.   Respiratory: Effort normal and breath sounds normal.  GI: Soft. Bowel sounds are normal.  Neurological: She is alert and oriented to person, place, and time.  Skin: Skin is warm and dry.  Psychiatric: She has a normal mood and affect. Her behavior is normal. Judgment and thought content normal.    Assessment/Plan Colorectal cancer screening-history of difficult intubation and has sleep apnea  g: proceed with a colonoscopy at this time.   Monae Topping 08/08/2015, 7:03 AM

## 2015-08-08 NOTE — Anesthesia Postprocedure Evaluation (Signed)
Anesthesia Post Note  Patient: Natasha [name]  Procedure(s) Performed: Procedure(s) (LRB): COLONOSCOPY WITH PROPOFOL (N/A)  Patient location during evaluation: Endoscopy Anesthesia Type: MAC Level of consciousness: awake and alert Pain management: pain level controlled Vital Signs Assessment: post-procedure vital signs reviewed and stable Respiratory status: spontaneous breathing, nonlabored ventilation, respiratory function stable and patient connected to nasal cannula oxygen Cardiovascular status: blood pressure returned to baseline and stable Postop Assessment: no signs of nausea or vomiting Anesthetic complications: no    Last Vitals:  Filed Vitals:   08/08/15 0756 08/08/15 0800  BP:  109/60  Pulse: 57 53  Temp:    Resp: 20 26    Last Pain: There were no vitals filed for this visit.               Adel Burch

## 2015-08-08 NOTE — Discharge Instructions (Signed)

## 2015-08-08 NOTE — Transfer of Care (Signed)
Immediate Anesthesia Transfer of Care Note  Patient: Natasha [name]  Procedure(s) Performed: Procedure(s): COLONOSCOPY WITH PROPOFOL (N/A)  Patient Location: PACU  Anesthesia Type:MAC  Level of Consciousness: awake and oriented  Airway & Oxygen Therapy: Patient Spontanous Breathing and Patient connected to face mask oxygen  Post-op Assessment: Report given to RN and Post -op Vital signs reviewed and stable  Post vital signs: Reviewed and stable  Last Vitals:  Filed Vitals:   08/08/15 0634 08/08/15 0756  BP: 116/70   Pulse: 63 57  Temp: 37.1 C   Resp: 10 20    Complications: No apparent anesthesia complications

## 2015-08-09 ENCOUNTER — Encounter (HOSPITAL_COMMUNITY): Payer: Self-pay | Admitting: Gastroenterology

## 2015-10-13 ENCOUNTER — Emergency Department (HOSPITAL_COMMUNITY)
Admission: EM | Admit: 2015-10-13 | Discharge: 2015-10-13 | Disposition: A | Discharge status: Home / Self Care | Payer: Medicare Other | Attending: Emergency Medicine | Admitting: Emergency Medicine

## 2015-10-13 ENCOUNTER — Encounter (HOSPITAL_COMMUNITY): Payer: Self-pay | Admitting: Emergency Medicine

## 2015-10-13 DIAGNOSIS — R1031 Right lower quadrant pain: Secondary | ICD-10-CM | POA: Insufficient documentation

## 2015-10-13 DIAGNOSIS — M79606 Pain in leg, unspecified: Secondary | ICD-10-CM | POA: Diagnosis not present

## 2015-10-13 LAB — COMPREHENSIVE METABOLIC PANEL
ALK PHOS: 103 U/L (ref 38–126)
ALT: 17 U/L (ref 14–54)
AST: 18 U/L (ref 15–41)
Albumin: 4.2 g/dL (ref 3.5–5.0)
Anion gap: 9 (ref 5–15)
BILIRUBIN TOTAL: 0.4 mg/dL (ref 0.3–1.2)
BUN: 16 mg/dL (ref 6–20)
CALCIUM: 9.1 mg/dL (ref 8.9–10.3)
CO2: 24 mmol/L (ref 22–32)
Chloride: 107 mmol/L (ref 101–111)
Creatinine, Ser: 0.76 mg/dL (ref 0.44–1.00)
GFR calc Af Amer: >60 mL/min (ref >60)
GFR calc non Af Amer: >60 mL/min (ref >60)
GLUCOSE: 95 mg/dL (ref 65–99)
Potassium: 3.8 mmol/L (ref 3.5–5.1)
Sodium: 140 mmol/L (ref 135–145)
TOTAL PROTEIN: 7.4 g/dL (ref 6.5–8.1)

## 2015-10-13 LAB — URINALYSIS, ROUTINE W REFLEX MICROSCOPIC
BILIRUBIN URINE: NEGATIVE
Glucose, UA: NEGATIVE mg/dL
HGB URINE DIPSTICK: NEGATIVE
KETONES UR: NEGATIVE mg/dL
Leukocytes, UA: NEGATIVE
NITRITE: NEGATIVE
PH: 6.5 (ref 5.0–8.0)
Protein, ur: NEGATIVE mg/dL
Specific Gravity, Urine: 1.009 (ref 1.005–1.030)

## 2015-10-13 LAB — CBC
HEMATOCRIT: 40.1 % (ref 36.0–46.0)
Hemoglobin: 13.3 g/dL (ref 12.0–15.0)
MCH: 30.2 pg (ref 26.0–34.0)
MCHC: 33.2 g/dL (ref 30.0–36.0)
MCV: 90.9 fL (ref 78.0–100.0)
Platelets: 259 10*3/uL (ref 150–400)
RBC: 4.41 MIL/uL (ref 3.87–5.11)
RDW: 13.9 % (ref 11.5–15.5)
WBC: 7.6 10*3/uL (ref 4.0–10.5)

## 2015-10-13 LAB — LIPASE, BLOOD: LIPASE: 20 U/L (ref 11–51)

## 2015-10-13 NOTE — ED Notes (Addendum)
Pain to RLQ, on-going x 2 months. Had negative colonoscopy in January. Woke up this morning with worsening RLQ pain, denies fever/nausea/vomiting. Also c/o pain radiating down into right thigh. Pulse movement sensation intact in bilateral LE. Pt states she's "ruled out everything except something gynocological." Denies any new vaginal drainage, states last normal BM was this a.m.

## 2015-10-13 NOTE — ED Notes (Signed)
Registration reports pt left.

## 2015-10-16 ENCOUNTER — Other Ambulatory Visit: Payer: Self-pay | Admitting: Chiropractic Medicine

## 2015-10-16 DIAGNOSIS — R1031 Right lower quadrant pain: Secondary | ICD-10-CM

## 2015-10-17 ENCOUNTER — Other Ambulatory Visit: Payer: Self-pay | Admitting: Family Medicine

## 2015-10-17 DIAGNOSIS — N83202 Unspecified ovarian cyst, left side: Principal | ICD-10-CM

## 2015-10-17 DIAGNOSIS — N83201 Unspecified ovarian cyst, right side: Secondary | ICD-10-CM

## 2015-10-17 DIAGNOSIS — N8302 Follicular cyst of left ovary: Secondary | ICD-10-CM

## 2015-10-18 ENCOUNTER — Other Ambulatory Visit: Payer: BC Managed Care – PPO

## 2015-10-20 ENCOUNTER — Ambulatory Visit
Admission: RE | Admit: 2015-10-20 | Discharge: 2015-10-20 | Disposition: A | Discharge status: Home / Self Care | Payer: Medicare Other | Source: Ambulatory Visit | Attending: Chiropractic Medicine | Admitting: Chiropractic Medicine

## 2015-10-20 DIAGNOSIS — R1031 Right lower quadrant pain: Secondary | ICD-10-CM

## 2015-10-26 ENCOUNTER — Other Ambulatory Visit: Payer: Self-pay | Admitting: Obstetrics and Gynecology

## 2015-10-27 ENCOUNTER — Other Ambulatory Visit: Payer: Self-pay | Admitting: Obstetrics and Gynecology

## 2015-10-27 DIAGNOSIS — R1031 Right lower quadrant pain: Secondary | ICD-10-CM

## 2015-11-01 ENCOUNTER — Encounter: Payer: Self-pay | Admitting: Gynecologic Oncology

## 2015-11-01 ENCOUNTER — Ambulatory Visit: Payer: Medicare Other | Attending: Gynecologic Oncology | Admitting: Gynecologic Oncology

## 2015-11-01 VITALS — BP 123/70 | HR 79 | Temp 97.7°F | Resp 18 | Ht 68.0 in | Wt 286.8 lb

## 2015-11-01 DIAGNOSIS — Z90710 Acquired absence of both cervix and uterus: Secondary | ICD-10-CM | POA: Diagnosis not present

## 2015-11-01 DIAGNOSIS — E890 Postprocedural hypothyroidism: Secondary | ICD-10-CM | POA: Diagnosis not present

## 2015-11-01 DIAGNOSIS — N83201 Unspecified ovarian cyst, right side: Secondary | ICD-10-CM

## 2015-11-01 DIAGNOSIS — R102 Pelvic and perineal pain: Secondary | ICD-10-CM | POA: Diagnosis not present

## 2015-11-01 DIAGNOSIS — G4733 Obstructive sleep apnea (adult) (pediatric): Secondary | ICD-10-CM | POA: Diagnosis not present

## 2015-11-01 DIAGNOSIS — N83202 Unspecified ovarian cyst, left side: Secondary | ICD-10-CM | POA: Diagnosis not present

## 2015-11-01 DIAGNOSIS — I10 Essential (primary) hypertension: Secondary | ICD-10-CM | POA: Diagnosis not present

## 2015-11-01 DIAGNOSIS — N83209 Unspecified ovarian cyst, unspecified side: Secondary | ICD-10-CM

## 2015-11-01 DIAGNOSIS — Z85850 Personal history of malignant neoplasm of thyroid: Secondary | ICD-10-CM | POA: Diagnosis not present

## 2015-11-01 DIAGNOSIS — Z6841 Body Mass Index (BMI) 40.0 and over, adult: Secondary | ICD-10-CM | POA: Insufficient documentation

## 2015-11-01 MED ORDER — IBUPROFEN 600 MG PO TABS: 600.0000 mg | ORAL_TABLET | Freq: Four times a day (QID) | ORAL | Status: DC | PRN

## 2015-11-01 NOTE — Progress Notes (Signed)
Consult Note: Gyn-Onc  Consult was requested by Dr. Radene Knee for the evaluation of Natasha [name] 65 y.o. female  CC:  Chief Complaint  Patient presents with  . Ovarian Cyst    New Consultation    Assessment/Plan:  Natasha [name]  is a 65 y.o.  year old with bilateral 2+cm ovarian cysts and a normal CA 125 in the setting of 6 months of right pelvic pain.  I personally viewed the [place] images with the patient. Her MRI has not yet been peformed. I do not believe that these cysts are malignant. They appear very benign on imaging.  Given their small size and bilateral location, I do not believe that they are the cause of her pain symptoms. Therefore, I do not recommend surgery at this time to remove the ovaries. She is morbidly obese (BMI 44kg/m2) and surgical risk outweighs the benefits from oophorectomy for this indication.  I encourage ongoing workup of her pain symptoms and hopefully a source will be uncovered from her MRI.   I have prescribed her ibuprofen to take for her pain symptoms.   HPI: Natasha [name] is a very pleasant 65 year old morbidly obese woman who Is seen in consultation at the request of Dr. Radene Knee for chronic right lower quadrant pelvic pain and bilateral ovarian cysts.  Patient has a history of developing right lower quadrant and pelvic pain approximate 6 months ago. It is somewhat constant but intermittently worse. She takes Tylenol for the pain with limited improvement. It is not associated with any other features such as change in bowel habits. Is not associated with eating. She had a colonoscopy as part of the workup for this and it was negative with the exception of some benign polyps. She had workup with Dr. McDiarmid from urology that was unremarkable. She received injection of what sounds like it might have been an anti-inflammatory and a prescription for meloxicam. The injection did improve her pain until it wore off. She did not take the meloxicam because she is  worried about side effects of the medication with respect to stroke risk.  As part of the workup for her pelvic pain symptoms she underwent a transvaginal ultrasound on therapy 17th 2017. This revealed a surgically absent uterus. The right ovary contained a follicular cyst measuring 2.6 x 1.8 x 1.7 cm containing a avascular mural nodule. The left ovary contained follicular cyst measuring 2.1 x 1.3 x 1.4 cm. There were no other concerning findings. A CA-125 tumor marker was drawn on 10/24/2015 and was normal at 4 units per milliliter.  She is otherwise fairly healthy with morbid obesity (BMI 44 kg meters squared) and hypertension. She is a past surgical history significant for a total abdominal hysterectomy for symptomatic uterine fibroids. The ovaries remained at that surgery.   Current Meds:  Outpatient Encounter Prescriptions as of 11/01/2015  Medication Sig  . benazepril-hydrochlorthiazide (LOTENSIN HCT) 20-25 MG per tablet Take 1 tablet by mouth daily.    Marland Kitchen diltiazem (CARDIZEM CD) 180 MG 24 hr capsule Take 180 mg by mouth daily.    . Multiple Vitamin (MULTIVITAMIN WITH MINERALS) TABS tablet Take 1 tablet by mouth daily.  . Omega-3 Fatty Acids (FISH OIL PO) Take 1 capsule by mouth daily.  Marland Kitchen OVER THE COUNTER MEDICATION Take 1 tablet by mouth daily. [employer] Cal-Mag-Vit.D  . SYNTHROID 112 MCG tablet Take 112 mcg by mouth daily.  Marland Kitchen ibuprofen (ADVIL,MOTRIN) 600 MG tablet Take 1 tablet (600 mg total) by mouth  every 6 (six) hours as needed.  . [DISCONTINUED] nitrofurantoin, macrocrystal-monohydrate, (MACROBID) 100 MG capsule Take 100 mg by mouth daily.   No facility-administered encounter medications on file as of 11/01/2015.    Allergy:  Allergies  Allergen Reactions  . Codeine Shortness Of Breath and Palpitations    Social Hx:   Social History   Social History  . Marital Status: Married    Spouse Name: N/A  . Number of Children: N/A  . Years of Education: N/A   Occupational History   . Teacher    Social History Main Topics  . Smoking status: Never Smoker   . Smokeless tobacco: Not on file  . Alcohol Use: No  . Drug Use: No  . Sexual Activity: Not on file   Other Topics Concern  . Not on file   Social History Narrative   Pt has children    Past Surgical Hx:  Past Surgical History  Procedure Laterality Date  . Cervical fusion      x 2  . Abdominal hysterectomy    . Knee surgery    . Ankle surgery    . Total thyroidectomy    . Colonoscopy with propofol N/A 08/08/2015    Procedure: COLONOSCOPY WITH PROPOFOL;  Surgeon: Juanita Craver, MD;  Location: [facility];  Service: Endoscopy;  Laterality: N/A;    Past Medical Hx:  Past Medical History  Diagnosis Date  . HYPOTHYROIDISM, POSTSURGICAL 05/04/2008  . 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 HYPERTENSION 12/25/2007  . Edema 12/22/2008  . LEG PAIN, LEFT 12/09/2008  . OSA (obstructive sleep apnea) 06/15/2013  . Complication of anesthesia     trouble with airway    Past Gynecological History:   No LMP recorded. Patient has had a hysterectomy.  Family Hx:  Family History  Problem Relation Age of Onset  . Heart disease Mother   . Kidney disease Mother     Review of Systems:  Constitutional  Feels well,    ENT Normal appearing ears and nares bilaterally Skin/Breast  No rash, sores, jaundice, itching, dryness Cardiovascular  No chest pain, shortness of breath, or edema  Pulmonary  No cough or wheeze.  Gastro Intestinal  + abdominal pain Genito Urinary  No frequency, urgency, dysuria,  Musculo Skeletal  No myalgia, arthralgia, joint swelling or pain  Neurologic  No weakness, numbness, change in gait,  Psychology  No depression, anxiety, insomnia.   Vitals:  Blood pressure 123/70, pulse 79, temperature 97.7 F (36.5 C), temperature  source Oral, resp. rate 18, height 5\' 8"  (1.727 m), weight 286 lb 12.8 oz (130.092 kg), SpO2 99 %.  Physical Exam: WD in NAD Neck  Supple NROM, without any enlargements.  Lymph Node Survey No cervical supraclavicular or inguinal adenopathy Cardiovascular  Pulse normal rate, regularity and rhythm. S1 and S2 normal.  Lungs  Clear to auscultation bilateraly, without wheezes/crackles/rhonchi. Good air movement.  Skin  No rash/lesions/breakdown  Psychiatry  Alert and oriented to person, place, and time  Abdomen  Normoactive bowel sounds, abdomen soft, non-tender and obese without evidence of hernia.  Back No CVA tenderness Genito Urinary  Vulva/vagina: Normal external female genitalia.   No lesions. No discharge or bleeding.  Bladder/urethra:  No lesions or masses, well supported bladder  Vagina: normal  Cervix: surgically absent  Uterus: surgically  absent  Adnexa: no palpable masses. No tenderness on pelvic hand with palpation. Rectal  Good tone, no masses no cul de sac nodularity.  Extremities  No bilateral cyanosis, clubbing or edema.   Donaciano Eva, MD  11/01/2015, 11:02 AM

## 2015-11-01 NOTE — Patient Instructions (Signed)
Plan to follow up with Dr. McComb.  Please call for any questions or concerns. 

## 2015-11-07 ENCOUNTER — Ambulatory Visit
Admission: RE | Admit: 2015-11-07 | Discharge: 2015-11-07 | Disposition: A | Discharge status: Home / Self Care | Payer: Medicare Other | Source: Ambulatory Visit | Attending: Obstetrics and Gynecology | Admitting: Obstetrics and Gynecology

## 2015-11-07 ENCOUNTER — Ambulatory Visit
Admission: RE | Admit: 2015-11-07 | Discharge: 2015-11-07 | Disposition: A | Discharge status: Home / Self Care | Payer: Self-pay | Source: Ambulatory Visit | Attending: Obstetrics and Gynecology | Admitting: Obstetrics and Gynecology

## 2015-11-07 DIAGNOSIS — R1031 Right lower quadrant pain: Secondary | ICD-10-CM

## 2015-11-17 ENCOUNTER — Other Ambulatory Visit: Payer: Self-pay

## 2015-11-29 ENCOUNTER — Ambulatory Visit
Admission: RE | Admit: 2015-11-29 | Discharge: 2015-11-29 | Disposition: A | Discharge status: Home / Self Care | Payer: Medicare Other | Source: Ambulatory Visit | Attending: Obstetrics and Gynecology | Admitting: Obstetrics and Gynecology

## 2015-11-29 MED ORDER — GADOBENATE DIMEGLUMINE 529 MG/ML IV SOLN
20.0000 mL | Freq: Once | INTRAVENOUS | Status: AC | PRN
  Administered 2015-11-29: 20 mL via INTRAVENOUS

## 2015-12-04 DIAGNOSIS — B309 Viral conjunctivitis, unspecified: Secondary | ICD-10-CM | POA: Diagnosis not present

## 2015-12-15 ENCOUNTER — Ambulatory Visit (INDEPENDENT_AMBULATORY_CARE_PROVIDER_SITE_OTHER): Payer: Commercial Managed Care - HMO | Admitting: Family Medicine

## 2015-12-15 VITALS — BP 126/84 | HR 68 | Temp 98.0°F | Resp 18 | Wt 286.0 lb

## 2015-12-15 DIAGNOSIS — M5416 Radiculopathy, lumbar region: Secondary | ICD-10-CM | POA: Diagnosis not present

## 2015-12-15 DIAGNOSIS — B370 Candidal stomatitis: Secondary | ICD-10-CM

## 2015-12-15 DIAGNOSIS — R209 Unspecified disturbances of skin sensation: Secondary | ICD-10-CM

## 2015-12-15 DIAGNOSIS — H6502 Acute serous otitis media, left ear: Secondary | ICD-10-CM | POA: Diagnosis not present

## 2015-12-15 DIAGNOSIS — M79652 Pain in left thigh: Secondary | ICD-10-CM | POA: Diagnosis not present

## 2015-12-15 DIAGNOSIS — N390 Urinary tract infection, site not specified: Secondary | ICD-10-CM

## 2015-12-15 LAB — POC MICROSCOPIC URINALYSIS (UMFC): Mucus: ABSENT

## 2015-12-15 LAB — POCT URINALYSIS DIP (MANUAL ENTRY)
BILIRUBIN UA: NEGATIVE
BILIRUBIN UA: NEGATIVE
GLUCOSE UA: NEGATIVE
Leukocytes, UA: NEGATIVE
NITRITE UA: NEGATIVE
Protein Ur, POC: 30 — AB
SPEC GRAV UA: 1.015
Urobilinogen, UA: 0.2
pH, UA: 6.5

## 2015-12-15 MED ORDER — MUCINEX DM MAXIMUM STRENGTH 60-1200 MG PO TB12: 1.0000 | ORAL_TABLET | Freq: Two times a day (BID) | ORAL | Status: DC

## 2015-12-15 MED ORDER — NYSTATIN 100000 UNIT/ML MT SUSP
5.0000 mL | Freq: Four times a day (QID) | OROMUCOSAL | Status: DC
Start: 2015-12-15 — End: 2015-12-31

## 2015-12-15 MED ORDER — PREDNISONE 10 MG PO TABS: ORAL_TABLET | ORAL | Status: DC

## 2015-12-15 MED ORDER — AMOXICILLIN 875 MG PO TABS: 875.0000 mg | ORAL_TABLET | Freq: Two times a day (BID) | ORAL | Status: DC

## 2015-12-15 NOTE — Patient Instructions (Addendum)
IF you received an x-ray today, you will receive an invoice from [facility]. Please contact [facility] at [phone] with questions or concerns regarding your invoice.   IF you received labwork today, you will receive an invoice from [employer]. Please contact [facility] at [phone] with questions or concerns regarding your invoice.   Our billing staff will not be able to assist you with questions regarding bills from these companies.  You will be contacted with the lab results as soon as they are available. The fastest way to get your results is to activate your My Chart account. Instructions are located on the last page of this paperwork. If you have not heard from [place] regarding the results in 2 weeks, please contact this office.    Serous Otitis Media Serous otitis media is fluid in the middle ear space. This space contains the bones for hearing and air. Air in the middle ear space helps to transmit sound.  The air gets there through the eustachian tube. This tube goes from the back of the nose (nasopharynx) to the middle ear space. It keeps the pressure in the middle ear the same as the outside world. It also helps to drain fluid from the middle ear space. CAUSES  Serous otitis media occurs when the eustachian tube gets blocked. Blockage can come from:  Ear infections.  Colds and other upper respiratory infections.  Allergies.  Irritants such as cigarette smoke.  Sudden changes in air pressure (such as descending in an airplane).  Enlarged adenoids.  A mass in the nasopharynx. During colds and upper respiratory infections, the middle ear space can become temporarily filled with fluid. This can happen after an ear infection also. Once the infection clears, the fluid will generally drain out of the ear through the eustachian tube. If it does not, then serous otitis media occurs. SIGNS AND SYMPTOMS   Hearing loss.  A feeling  of fullness in the ear, without pain.  Young children may not show any symptoms but may show slight behavioral changes, such as agitation, ear pulling, or crying. DIAGNOSIS  Serous otitis media is diagnosed by an ear exam. Tests may be done to check on the movement of the eardrum. Hearing exams may also be done. TREATMENT  The fluid most often goes away without treatment. If allergy is the cause, allergy treatment may be helpful. Fluid that persists for several months may require minor surgery. A small tube is placed in the eardrum to:  Drain the fluid.  Restore the air in the middle ear space. In certain situations, antibiotic medicines are used to avoid surgery. Surgery may be done to remove enlarged adenoids (if this is the cause). HOME CARE INSTRUCTIONS   Keep children away from tobacco smoke.  Keep all follow-up visits as directed by your health care provider. SEEK MEDICAL CARE IF:   Your hearing is not better in 3 months.  Your hearing is worse.  You have ear pain.  You have drainage from the ear.  You have dizziness.  You have serous otitis media only in one ear or have any bleeding from your nose (epistaxis).  You notice a lump on your neck. MAKE SURE YOU:  Understand these instructions.   Will watch your condition.   Will get help right away if you are not doing well or get worse.    This information is not intended to replace advice given to you by your health care provider. Make sure  you discuss any questions you have with your health care provider.   Document Released: 11/09/2003 Document Revised: 09/09/2014 Document Reviewed: 03/16/2013 Elsevier Interactive Patient Education 2016 [place], Adult Ritta Slot, also called oral candidiasis, is a fungal infection that develops in the mouth and throat and on the tongue. It causes white patches to form on the mouth and tongue. Ritta Slot is most common in older adults, but it can occur at any age.  Many  cases of thrush are mild, but this infection can also be more serious. Ritta Slot can be a recurring problem for people who have chronic illnesses or who take medicines that limit the body's ability to fight infection. Because these people have difficulty fighting infections, the fungus that causes thrush can spread throughout the body. This can cause life-threatening blood or organ infections. CAUSES  Ritta Slot is usually caused by a yeast called Candida albicans. This fungus is normally present in small amounts in the mouth and on other mucous membranes. It usually causes no harm. However, when conditions are present that allow the fungus to grow uncontrolled, it invades surrounding tissues and becomes an infection. Less often, other Candida species can also lead to thrush.  RISK FACTORS Ritta Slot is more likely to develop in the following people:  People with an impaired ability to fight infection (weakened immune system).   Older adults.   People with HIV.   People with diabetes.   People with dry mouth (xerostomia).   Pregnant women.   People with poor dental care, especially those who have false teeth.   People who use antibiotic medicines.  SIGNS AND SYMPTOMS  Ritta Slot can be a mild infection that causes no symptoms. If symptoms develop, they may include:   A burning feeling in the mouth and throat. This can occur at the start of a thrush infection.   White patches that adhere to the mouth and tongue. The tissue around the patches may be red, raw, and painful. If rubbed (during tooth brushing, for example), the patches and the tissue of the mouth may bleed easily.   A bad taste in the mouth or difficulty tasting foods.   Cottony feeling in the mouth.   Pain during eating and swallowing. DIAGNOSIS  Your health care provider can usually diagnose thrush by looking in your mouth and asking you questions about your health.  TREATMENT  Medicines that help prevent the growth of  fungi (antifungals) are the standard treatment for thrush. These medicines are either applied directly to the affected area (topical) or swallowed (oral). The treatment will depend on the severity of the condition.  Mild Thrush Mild cases of thrush may clear up with the use of an antifungal mouth rinse or lozenges. Treatment usually lasts about 14 days.  Moderate to Severe Thrush  More severe thrush infections that have spread to the esophagus are treated with an oral antifungal medicine. A topical antifungal medicine may also be used.   For some severe infections, a treatment period longer than 14 days may be needed.   Oral antifungal medicines are almost never used during pregnancy because the fetus may be harmed. However, if a pregnant woman has a rare, severe thrush infection that has spread to her blood, oral antifungal medicines may be used. In this case, the risk of harm to the mother and fetus from the severe thrush infection may be greater than the risk posed by the use of antifungal medicines.  Persistent or Recurrent Thrush For cases of thrush that do  not go away or keep coming back, treatment may involve the following:   Treatment may be needed twice as long as the symptoms last.   Treatment will include both oral and topical antifungal medicines.   People with weakened immune systems can take an antifungal medicine on a continuous basis to prevent thrush infections.  It is important to treat conditions that make you more likely to get thrush, such as diabetes or HIV.  HOME CARE INSTRUCTIONS   Only take over-the-counter or prescription medicine as directed by your health care provider. Talk to your health care provider about an over-the-counter medicine called gentian violet, which kills bacteria and fungi.   Eat plain, unflavored yogurt as directed by your health care provider. Check the label to make sure the yogurt contains live cultures. This yogurt can help healthy  bacteria grow in the mouth that can stop the growth of the fungus that causes thrush.   Try these measures to help reduce the discomfort of thrush:   Drink cold liquids such as water or iced tea.   Try flavored ice treats or frozen juices.   Eat foods that are easy to swallow, such as gelatin, ice cream, or custard.   If the patches in your mouth are painful, try drinking from a straw.   Rinse your mouth several times a day with a warm saltwater rinse. You can make the saltwater mixture with 1 tsp (6 g) of salt in 8 fl oz (0.2 L) of warm water.   If you wear dentures, remove the dentures before going to bed, brush them vigorously, and soak them in a cleaning solution as directed by your health care provider.   Women who are breastfeeding should clean their nipples with an antifungal medicine as directed by their health care provider. Dry the nipples after breastfeeding. Applying lanolin-containing body lotion may help relieve nipple soreness.  SEEK MEDICAL CARE IF:  Your symptoms are getting worse or are not improving within 7 days of starting treatment.   You have symptoms of spreading infection, such as white patches on the skin outside of the mouth.   You are nursing and you have redness, burning, or pain in the nipples that is not relieved with treatment.  MAKE SURE YOU:  Understand these instructions.  Will watch your condition.  Will get help right away if you are not doing well or get worse.   This information is not intended to replace advice given to you by your health care provider. Make sure you discuss any questions you have with your health care provider.   Document Released: 05/14/2004 Document Revised: 09/09/2014 Document Reviewed: 03/22/2013 Elsevier Interactive Patient Education [employer].

## 2015-12-15 NOTE — Progress Notes (Signed)
By signing my name below I, [name], attest that this documentation has been prepared under the direction and in the presence of Delman Cheadle, MD. Electonically Signed. [name], Scribe 12/15/2015 at 1:56 PM  Subjective:    Patient ID: Natasha [name], female    DOB: 1950/12/03, 65 y.o.   MRN: [id number] Chief Complaint  Patient presents with  . URI  . Sore Throat  . Leg Pain    Left thigh    HPI Natasha [name] is a 65 y.o. female who presents to the [facility] complaining of sore throat. Pt states that she had a sick contact with the flu 3 weeks ago and then started having her sore throat which started to improve then got worse and has been constant for the past week.  Pt also reports having a cough with her sore throat.  Pt states that she has a breo inhaler that she uses once a day at night for the past month.  Pt also reports having swollen glands in her neck.  Pt also c/o chills and fevers.  Pt also reports having blisters on the roof of her mouth. Pt denies any rash.  Pt states that she has had a recurrent UTI for the past 6 months and has had a ovarian cyst that was causing her pain that resolved on its own. Pt states that her cyst was evaluated and it was found to be benign.  Pt also c/o left thigh pain and tingling. Pt reports that her tingling is improved when she stands.  Pt has history of knee and ankle surgery.  Pt had a L-spine MRI done 2 years ago that showed multi level severe impingement of L2-S1.    Past Medical History  Diagnosis Date  . HYPOTHYROIDISM, POSTSURGICAL 05/04/2008  . 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 HYPERTENSION 12/25/2007  . Edema 12/22/2008  . LEG PAIN, LEFT 12/09/2008  . OSA (obstructive sleep apnea) 06/15/2013  . Complication of anesthesia     trouble  with airway    Current outpatient prescriptions:  .  benazepril-hydrochlorthiazide (LOTENSIN HCT) 20-25 MG per tablet, Take 1 tablet by mouth daily.  , Disp: , Rfl:  .  diltiazem (CARDIZEM CD) 180 MG 24 hr capsule, Take 180 mg by mouth daily.  , Disp: , Rfl:  .  ibuprofen (ADVIL,MOTRIN) 600 MG tablet, Take 1 tablet (600 mg total) by mouth every 6 (six) hours as needed., Disp: 30 tablet, Rfl: 0 .  Multiple Vitamin (MULTIVITAMIN WITH MINERALS) TABS tablet, Take 1 tablet by mouth daily., Disp: , Rfl:  .  Omega-3 Fatty Acids (FISH OIL PO), Take 1 capsule by mouth daily., Disp: , Rfl:  .  OVER THE COUNTER MEDICATION, Take 1 tablet by mouth daily. [employer] Cal-Mag-Vit.D, Disp: , Rfl:  .  SYNTHROID 112 MCG tablet, Take 112 mcg by mouth daily., Disp: , Rfl: 3  Allergies  Allergen Reactions  . Codeine Shortness Of Breath and Palpitations   Depression screen [facility] 2/9 12/15/2015  Decreased Interest 0  Down, Depressed, Hopeless 0  PHQ - 2 Score 0      Review of Systems  Constitutional: Positive for fever and chills.  HENT: Positive for sore throat.   Eyes: Negative for pain.  Respiratory: Positive for cough.   Cardiovascular:  Negative for chest pain.  Gastrointestinal: Negative for abdominal pain.  Musculoskeletal:       Pt Positive for left thigh pain.  Skin: Negative for rash.  Neurological: Negative for headaches.  Hematological: Positive for adenopathy.  Psychiatric/Behavioral: Negative for agitation.       Objective:  BP 126/84 mmHg  Pulse 68  Temp(Src) 98 F (36.7 C) (Oral)  Resp 18  Wt 286 lb (129.729 kg)  SpO2 98%  Physical Exam  Constitutional: She is oriented to person, place, and time. She appears well-developed and well-nourished. No distress.  HENT:  Head: Normocephalic and atraumatic.  Right Ear: Tympanic membrane normal.  Left Ear: Tympanic membrane is injected (with thickened yellowish appearance.).  Nose: Nose normal.  Pinpoint vesicles over soft palate of  oropharynx   Eyes: Conjunctivae are normal. Pupils are equal, round, and reactive to light.  Neck: Neck supple.  Cardiovascular: Normal rate, regular rhythm and normal heart sounds.  Exam reveals no gallop.   No murmur heard. Pulmonary/Chest: Effort normal.  Pt has bronchial breath sounds throughout bilat lung fields.   Musculoskeletal: Normal range of motion.  Lymphadenopathy:    She has no cervical adenopathy.  Neurological: She is alert and oriented to person, place, and time. Gait normal.  Skin: Skin is warm and dry.  Psychiatric: She has a normal mood and affect. Her behavior is normal.  Nursing note and vitals reviewed.     Results for orders placed or performed in visit on 12/15/15  POCT urinalysis dipstick  Result Value Ref Range   Color, UA yellow yellow   Clarity, UA clear clear   Glucose, UA negative negative   Bilirubin, UA negative negative   Ketones, POC UA negative negative   Spec Grav, UA 1.015    Blood, UA trace-intact (A) negative   pH, UA 6.5    Protein Ur, POC =30 (A) negative   Urobilinogen, UA 0.2    Nitrite, UA Negative Negative   Leukocytes, UA Negative Negative  POCT Microscopic Urinalysis ([facility])  Result Value Ref Range   WBC,UR,HPF,POC Few (A) None WBC/hpf   RBC,UR,HPF,POC None None RBC/hpf   Bacteria Few (A) None, Too numerous to count   Mucus Absent Absent   Epithelial Cells, UR Per Microscopy Few (A) None, Too numerous to count cells/hpf    Assessment & Plan:   1. Recurrent UTI   2. Pain of left thigh   3. Disturbance of skin sensation   4. Thrush   5. Acute serous otitis media of left ear, recurrence not specified   6. Lumbar radiculopathy, acute     Orders Placed This Encounter  Procedures  . POCT urinalysis dipstick  . POCT Microscopic Urinalysis ([facility])    Meds ordered this encounter  Medications  . nystatin (MYCOSTATIN) 100000 UNIT/ML suspension    Sig: Take 5 mLs (500,000 Units total) by mouth 4 (four) times daily.     Dispense:  180 mL    Refill:  0  . predniSONE (DELTASONE) 10 MG tablet    Sig: 6-5-4-3-2-1 tabs po qd    Dispense:  21 tablet    Refill:  0  . Dextromethorphan-Guaifenesin (MUCINEX DM MAXIMUM STRENGTH) 60-1200 MG TB12    Sig: Take 1 tablet by mouth every 12 (twelve) hours.    Dispense:  14 each    Refill:  0  . amoxicillin (AMOXIL) 875 MG tablet    Sig: Take 1 tablet (875 mg total) by mouth 2 (two) times daily.  Dispense:  20 tablet    Refill:  0    I personally performed the services described in this documentation, which was scribed in my presence. The recorded information has been reviewed and considered, and addended by me as needed.  Delman Cheadle, MD MPH

## 2015-12-31 ENCOUNTER — Ambulatory Visit (INDEPENDENT_AMBULATORY_CARE_PROVIDER_SITE_OTHER): Payer: Commercial Managed Care - HMO

## 2015-12-31 ENCOUNTER — Ambulatory Visit (INDEPENDENT_AMBULATORY_CARE_PROVIDER_SITE_OTHER): Payer: Commercial Managed Care - HMO | Admitting: Family Medicine

## 2015-12-31 ENCOUNTER — Ambulatory Visit: Payer: Commercial Managed Care - HMO

## 2015-12-31 VITALS — BP 136/74 | HR 69 | Temp 98.2°F | Resp 14 | Ht 68.0 in | Wt 292.0 lb

## 2015-12-31 DIAGNOSIS — R509 Fever, unspecified: Secondary | ICD-10-CM | POA: Diagnosis not present

## 2015-12-31 DIAGNOSIS — R0602 Shortness of breath: Secondary | ICD-10-CM

## 2015-12-31 DIAGNOSIS — E890 Postprocedural hypothyroidism: Secondary | ICD-10-CM

## 2015-12-31 DIAGNOSIS — I10 Essential (primary) hypertension: Secondary | ICD-10-CM

## 2015-12-31 DIAGNOSIS — R6883 Chills (without fever): Secondary | ICD-10-CM | POA: Diagnosis not present

## 2015-12-31 LAB — POCT SEDIMENTATION RATE: POCT SED RATE: 21 mm/hr (ref 0–22)

## 2015-12-31 LAB — TSH: TSH: 3.27 mIU/L

## 2015-12-31 LAB — POC MICROSCOPIC URINALYSIS (UMFC): MUCUS RE: ABSENT

## 2015-12-31 LAB — POCT URINALYSIS DIP (MANUAL ENTRY)
BILIRUBIN UA: NEGATIVE
Bilirubin, UA: NEGATIVE
Blood, UA: NEGATIVE
GLUCOSE UA: NEGATIVE
Leukocytes, UA: NEGATIVE
Nitrite, UA: NEGATIVE
Protein Ur, POC: NEGATIVE
SPEC GRAV UA: 1.01
UROBILINOGEN UA: 0.2
pH, UA: 5.5

## 2015-12-31 LAB — POCT CBC
GRANULOCYTE PERCENT: 46.2 % (ref 37–80)
HCT, POC: 38.5 % (ref 37.7–47.9)
Hemoglobin: 13.6 g/dL (ref 12.2–16.2)
Lymph, poc: 2.7 (ref 0.6–3.4)
MCH: 30.6 pg (ref 27–31.2)
MCHC: 35.4 g/dL (ref 31.8–35.4)
MCV: 86.5 fL (ref 80–97)
MID (CBC): 0.6 (ref 0–0.9)
MPV: 7.2 fL (ref 0–99.8)
POC GRANULOCYTE: 2.9 (ref 2–6.9)
POC LYMPH PERCENT: 43.5 %L (ref 10–50)
POC MID %: 10.3 % (ref 0–12)
Platelet Count, POC: 233 10*3/uL (ref 142–424)
RBC: 4.44 M/uL (ref 4.04–5.48)
RDW, POC: 14.9 %
WBC: 6.3 10*3/uL (ref 4.6–10.2)

## 2015-12-31 LAB — COMPREHENSIVE METABOLIC PANEL
ALBUMIN: 4.2 g/dL (ref 3.6–5.1)
ALT: 15 U/L (ref 6–29)
AST: 14 U/L (ref 10–35)
Alkaline Phosphatase: 100 U/L (ref 33–130)
BILIRUBIN TOTAL: 0.5 mg/dL (ref 0.2–1.2)
BUN: 14 mg/dL (ref 7–25)
CALCIUM: 9.3 mg/dL (ref 8.6–10.4)
CO2: 28 mmol/L (ref 20–31)
CREATININE: 0.74 mg/dL (ref 0.50–0.99)
Chloride: 106 mmol/L (ref 98–110)
GLUCOSE: 88 mg/dL (ref 65–99)
Potassium: 4 mmol/L (ref 3.5–5.3)
Sodium: 141 mmol/L (ref 135–146)
Total Protein: 7.2 g/dL (ref 6.1–8.1)

## 2015-12-31 MED ORDER — IPRATROPIUM BROMIDE 0.03 % NA SOLN: 2.0000 | Freq: Four times a day (QID) | NASAL | Status: DC

## 2015-12-31 MED ORDER — MUCINEX DM MAXIMUM STRENGTH 60-1200 MG PO TB12: 1.0000 | ORAL_TABLET | Freq: Two times a day (BID) | ORAL | Status: DC

## 2015-12-31 MED ORDER — BENAZEPRIL-HYDROCHLOROTHIAZIDE 20-25 MG PO TABS: 1.0000 | ORAL_TABLET | Freq: Every day | ORAL | Status: DC

## 2015-12-31 NOTE — Patient Instructions (Addendum)
IF you received an x-ray today, you will receive an invoice from [facility]. Please contact [facility] at [phone] with questions or concerns regarding your invoice.   IF you received labwork today, you will receive an invoice from [employer]. Please contact [facility] at [phone] with questions or concerns regarding your invoice.   Our billing staff will not be able to assist you with questions regarding bills from these companies.  You will be contacted with the lab results as soon as they are available. The fastest way to get your results is to activate your My Chart account. Instructions are located on the last page of this paperwork. If you have not heard from [place] regarding the results in 2 weeks, please contact this office.     I do not know why you have been feeling hot.  I am hoping that this is a virus that passes.  You do have some congestion so start the atrovent nasal spray four times a day and keep on Mucinex DM for the next week or so as well.   Restart your benazepril-hctz - I sent a refill of this to your pharmacy.  If you keep on feeling fatigued and we can't find a cause in the blood work that was sent out, you may want to check with your PCP to see if you may need to decrease your diltiazem dose and see a cardiologist as a heart rate at 50 today may make you feel fatigued and dragging.  Keep on using tylenol as needed to keep the sweats away.  First-Degree Atrioventricular Block First-degree atrioventricular (AV) block is a type of heart block. About Heart Block Heart block is a problem with the system that controls how often the heart beats (heart rate). If you have heart block, the electrical signals that regulate your heart rate are slowed or interrupted. Normal Heart Action The heart has two upper chambers (atria) and two lower chambers (ventricles). They work together to pump blood to the body. The heartbeat  starts in an upper area of the right atrium (sinoatrial node, or SA node). This is the heart's natural pacemaker. The SA node sends electrical signals that pass through another node (atrioventricular node, or AV node), which is located between the atria and ventricles. Next, the signals travel through the ventricles on conduction pathways. As the signals pass down these pathways, the ventricles contract and send blood out to the body. About First-Degree AV Block First-degree heart block is the least serious type of AV block. If you have first-degree AV block, the signals travel more slowly through your heart. This can cause your heart to beat more slowly than normal, but your heart does not miss any beats. Treatment is usually not needed, but the condition may increase your risk of developing an irregular heart rhythm (atrial fibrillation). CAUSES  In some cases, a person is born with heart block. More often, the condition develops over time. First-degree heart block may be caused by:  Any condition that damages the heart's conducting system.  Overstimulation of a nerve that slows down the heart (vagus nerve). This is common in well-conditioned athletes.  Some medicines that slow down the heart rate. RISK FACTORS The risk for this condition increases with age. It is also more likely to develop in people who have any of the following conditions:  A heart attack in the past.  Heart failure.  Coronary heart disease.  Inflammation of heart muscle (myocarditis).  Disease  of heart muscle (cardiomyopathy).  Infection of the heart valves (endocarditis).  Infections or diseases that affect the heart. These include:  Lyme disease.  Sarcoidosis.  Hemochromatosis.  Rheumatic fever. SYMPTOMS This condition usually does not cause any symptoms. DIAGNOSIS This condition may be diagnosed during a routine physical exam. Your health care provider may suspect a heart block if you have a slow pulse  or heartbeat. You may have tests to confirm the diagnosis and to rule out other conditions. These tests may include:  An electrocardiogram (ECG) to check for problems with the electrical activity in your heart.  Wearing a portable ECG device for a few days (Holter monitor) or a few weeks (event monitor). This is done to monitor your heart rate over time. TREATMENT Usually, treatment is not needed for this condition. You may get treatment for another condition that is causing heart block. You may also need to change or stop taking any heart medicines that could cause heart block. HOME CARE INSTRUCTIONS  Take over-the-counter and prescription medicines only as told by your health care provider.  Work with your health care providers to control all of your risks for heart disease. This may include following these instructions:  Get regular exercise. Ask your health care provider what type of exercise is safe for you.  Eat a heart-healthy diet. Your health care provider or dietitian can help you make healthy choices.  Maintain a healthy weight.  Do not use any tobacco products, including cigarettes, chewing tobacco, or e-cigarettes. If you need help quitting, ask your health care provider.  Limit alcohol intake to no more than 1 drink per day for nonpregnant women and 2 drinks per day for men. One drink equals 12 oz of beer, 5 oz of wine, or 1 oz of hard liquor.  Keep all follow-up visits as told by your health care provider. This is important. SEEK MEDICAL CARE IF:  You feel as though your heart is skipping beats.  You feel more tired than normal. SEEK IMMEDIATE MEDICAL CARE IF:  You have chest pain, especially if the pain:  Feels like crushing or pressure.  Spreads to your arms, back, neck, or jaw.  You feel short of breath.  You feel light-headed or weak.  You faint.   This information is not intended to replace advice given to you by your health care provider. Make sure you  discuss any questions you have with your health care provider.   Document Released: 08/01/2008 Document Revised: 01/03/2015 Document Reviewed: 07/20/2014 Elsevier Interactive Patient Education 2016 Elsevier Inc. Bradycardia Bradycardia is a slower-than-normal heart rate. A normal resting heart rate for an adult ranges from 60 to 100 beats per minute. With bradycardia, the resting heart rate is less than 60 beats per minute. Bradycardia is a problem if your heart cannot pump enough oxygen-rich blood through your body. Bradycardia is not a problem for everyone. For some healthy adults, a slow resting heart rate is normal.  CAUSES  Bradycardia may be caused by:  A problem with the heart's electrical system, such as heart block.  A problem with the heart's natural pacemaker (sinus node).  Heart disease, damage, or infection.  Certain medicines that treat heart conditions.  Certain conditions, such as hypothyroidism and obstructive sleep apnea. RISK FACTORS  Risk factors include:  Being 33 or older.  Having high blood pressure (hypertension), high cholesterol (hyperlipidemia), or diabetes.  Drinking heavily, using tobacco products, or using drugs.  Being stressed. SIGNS AND SYMPTOMS  Signs and  symptoms include:  Light-headedness.  Faintingor near fainting.  Fatigue and weakness.  Shortness of breath.  Chest pain (angina).  Drowsiness.  Confusion.  Dizziness. DIAGNOSIS  Diagnosis of bradycardia may include:  A physical exam.  An electrocardiogram (ECG).  Blood tests. TREATMENT  Treatment for bradycardia may include:  Treatment of an underlying condition.  Pacemaker placement. A pacemaker is a small, battery-powered device that is placed under the skin and is programmed to sense your heartbeats. If your heart rate is lower than the programmed rate, the pacemaker will pace your heart.  Changing your medicines or dosages. HOME CARE INSTRUCTIONS  Take  medicines only as directed by your health care provider.  Manage any health conditions that contribute to bradycardia as directed by your health care provider.  Follow a heart-healthy diet. A dietitian can help educate you on healthy food options and changes.  Follow an exercise program approved by your health care provider.  Maintain a healthy weight. Lose weight as approved by your health care provider.  Do not use tobacco products, including cigarettes, chewing tobacco, or electronic cigarettes. If you need help quitting, ask your health care provider.  Do not use illegal drugs.  Limit alcohol intake to no more than 1 drink per day for nonpregnant women and 2 drinks per day for men. One drink equals 12 ounces of beer, 5 ounces of wine, or 1 ounces of hard liquor.  Keep all follow-up visits as directed by your health care provider. This is important. SEEK MEDICAL CARE IF:  You feel light-headed or dizzy.  You almost faint.  You feel weak or are easily fatigued during physical activity.  You experience confusion or have memory problems. SEEK IMMEDIATE MEDICAL CARE IF:   You faint.  You have an irregular heartbeat.  You have chest pain.  You have trouble breathing. MAKE SURE YOU:   Understand these instructions.  Will watch your condition.  Will get help right away if you are not doing well or get worse.   This information is not intended to replace advice given to you by your health care provider. Make sure you discuss any questions you have with your health care provider.   Document Released: 05/11/2002 Document Revised: 09/09/2014 Document Reviewed: 11/24/2013 Elsevier Interactive Patient Education [employer].

## 2015-12-31 NOTE — Progress Notes (Signed)
Subjective:    Patient ID: Natasha [name], female    DOB: [birth year], 65 y.o.   MRN: [id number] By signing my name below, I, [name], attest that this documentation has been prepared under the direction and in the presence of Delman Cheadle, MD. Electronically Signed: [name], ER Scribe. 12/31/2015. 11:02 AM.  Chief Complaint  Patient presents with  . Fever    4 days  . Sore Throat  . Ear Fullness    HPI HPI Comments: Natasha [name] is a 65 y.o. female who presents to [facility] complaining of continued illness sx. Her previous sickness sx resolved by the end of her course of abx, but since she stopped taking amoxicillin four days ago, and she has felt hot all the time. She started having chills yesterday. She has had two events of night sweats. She has been taking tylenol to reduce the fever-like symptom, which does reduce the feeling of heat temporarily. She endorses some mild SOB. She has gained 10 lbs in the last two months. She has had difficulty sleeping due to the overheating feeling. She is not using her inhaler at night anymore.   I saw Ms Eustache two weeks ago, and at that time she had sore throat for three weeks with cough, adenopathy, subjective fevers and chills. She was using a steroid inhaler at night. She was treated with amoxicillin BID ten days and prednisone six day taper.    Past Medical History  Diagnosis Date  . HYPOTHYROIDISM, POSTSURGICAL 05/04/2008  . 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 HYPERTENSION 12/25/2007  . Edema 12/22/2008  . LEG PAIN, LEFT 12/09/2008  . OSA (obstructive sleep apnea) 06/15/2013  . Complication of anesthesia     trouble with airway   Allergies  Allergen Reactions  . Codeine Shortness Of Breath and Palpitations   Current Outpatient Prescriptions on File Prior to Visit    Medication Sig Dispense Refill  . amoxicillin (AMOXIL) 875 MG tablet Take 1 tablet (875 mg total) by mouth 2 (two) times daily. 20 tablet 0  . benazepril-hydrochlorthiazide (LOTENSIN HCT) 20-25 MG per tablet Take 1 tablet by mouth daily.      Marland Kitchen Dextromethorphan-Guaifenesin (MUCINEX DM MAXIMUM STRENGTH) 60-1200 MG TB12 Take 1 tablet by mouth every 12 (twelve) hours. 14 each 0  . diltiazem (CARDIZEM CD) 180 MG 24 hr capsule Take 180 mg by mouth daily.      Marland Kitchen ibuprofen (ADVIL,MOTRIN) 600 MG tablet Take 1 tablet (600 mg total) by mouth every 6 (six) hours as needed. 30 tablet 0  . Multiple Vitamin (MULTIVITAMIN WITH MINERALS) TABS tablet Take 1 tablet by mouth daily.    Marland Kitchen nystatin (MYCOSTATIN) 100000 UNIT/ML suspension Take 5 mLs (500,000 Units total) by mouth 4 (four) times daily. 180 mL 0  . Omega-3 Fatty Acids (FISH OIL PO) Take 1 capsule by mouth daily.    Marland Kitchen OVER THE COUNTER MEDICATION Take 1 tablet by mouth daily. [employer] Cal-Mag-Vit.D    . SYNTHROID 112 MCG tablet Take 112 mcg by mouth daily.  3  . predniSONE (DELTASONE) 10 MG tablet 6-5-4-3-2-1 tabs po qd (Patient not taking: Reported on 12/31/2015) 21 tablet 0   No current facility-administered medications on file prior to visit.   Depression screen [facility] 2/9 12/31/2015 12/15/2015  Decreased Interest 0  0  Down, Depressed, Hopeless 0 0  PHQ - 2 Score 0 0     Review of Systems  Constitutional: Positive for fever, chills, diaphoresis, activity change, fatigue and unexpected weight change. Negative for appetite change.  HENT: Positive for congestion, postnasal drip and rhinorrhea.   Respiratory: Positive for cough and shortness of breath. Negative for chest tightness and wheezing.   Cardiovascular: Negative for chest pain and palpitations.  Endocrine: Positive for heat intolerance. Negative for cold intolerance.  Musculoskeletal: Positive for myalgias, back pain, joint swelling, arthralgias and gait problem.  Allergic/Immunologic:  Negative for environmental allergies and immunocompromised state.  Neurological: Positive for weakness and light-headedness. Negative for headaches.  Hematological: Negative for adenopathy.  Psychiatric/Behavioral: Positive for sleep disturbance. Negative for dysphoric mood.      Objective:  BP 136/74 mmHg  Pulse 69  Temp(Src) 98.2 F (36.8 C) (Oral)  Resp 14  Ht 5' 8"  (1.727 m)  Wt 292 lb (132.45 kg)  BMI 44.41 kg/m2  SpO2 96%  Physical Exam  Constitutional: She is oriented to person, place, and time. She appears well-developed and well-nourished. No distress.  HENT:  Head: Normocephalic and atraumatic.  TMs normal. Nares pale and boggy. oropharynx normal.   Eyes: Pupils are equal, round, and reactive to light.  Neck: Neck supple.  Thyroid normal, no lymphadenopathy.   Cardiovascular: Normal rate, regular rhythm and normal heart sounds.   Bowel sounds in chest.  Pulmonary/Chest: Effort normal. No respiratory distress.  Good air movement, lungs clear.   Musculoskeletal: Normal range of motion.  Neurological: She is alert and oriented to person, place, and time. Coordination normal.  Skin: Skin is warm and dry. She is not diaphoretic.  Psychiatric: She has a normal mood and affect. Her behavior is normal.  Nursing note and vitals reviewed.  Dg Chest 2 View  12/31/2015  CLINICAL DATA:  Chills. EXAM: CHEST  2 VIEW COMPARISON:  8/7/9 FINDINGS: The heart size and mediastinal contours are within normal limits. No airspace consolidation.Both lungs are clear. Spondylosis is noted within the thoracic spine. IMPRESSION: 1. Normal heart size. 2. No acute cardiopulmonary abnormalities. Electronically Signed   By: Kerby Moors M.D.   On: 12/31/2015 12:20    EKG read by Dr. Brigitte Pulse: normal sinus rhythm. Low voltage.  First deg av block with PR interval 222 ms.  Flipped Ts in lead 3 and in chest leads V1, V4-6     Results for orders placed or performed in visit on 12/31/15  POCT CBC    Result Value Ref Range   WBC 6.3 4.6 - 10.2 K/uL   Lymph, poc 2.7 0.6 - 3.4   POC LYMPH PERCENT 43.5 10 - 50 %L   MID (cbc) 0.6 0 - 0.9   POC MID % 10.3 0 - 12 %M   POC Granulocyte 2.9 2 - 6.9   Granulocyte percent 46.2 37 - 80 %G   RBC 4.44 4.04 - 5.48 M/uL   Hemoglobin 13.6 12.2 - 16.2 g/dL   HCT, POC 38.5 37.7 - 47.9 %   MCV 86.5 80 - 97 fL   MCH, POC 30.6 27 - 31.2 pg   MCHC 35.4 31.8 - 35.4 g/dL   RDW, POC 14.9 %   Platelet Count, POC 233 142 - 424 K/uL   MPV 7.2 0 - 99.8 fL  POCT urinalysis dipstick  Result Value Ref Range   Color, UA yellow yellow   Clarity, UA clear clear   Glucose, UA negative negative  Bilirubin, UA negative negative   Ketones, POC UA negative negative   Spec Grav, UA 1.010    Blood, UA negative negative   pH, UA 5.5    Protein Ur, POC negative negative   Urobilinogen, UA 0.2    Nitrite, UA Negative Negative   Leukocytes, UA Negative Negative  POCT Microscopic Urinalysis ([facility])  Result Value Ref Range   WBC,UR,HPF,POC None None WBC/hpf   RBC,UR,HPF,POC None None RBC/hpf   Bacteria None None, Too numerous to count   Mucus Absent Absent   Epithelial Cells, UR Per Microscopy Few (A) None, Too numerous to count cells/hpf    Assessment & Plan:   1. Fever, unspecified   2. Shortness of breath   3. HYPOTHYROIDISM, POSTSURGICAL   Unknown etiology of her heat intolerance developed over the last several days - workup here relatively benign.  May be viral URI so cont watchful waiting with symptomatic care of tylenol, atrovent, and mucinex.  TSH, CMP, ESR still P.  If she cont to feel fatigued, recheck w/ PCP as may need to decrease diltiazem due to bradycardia and 1st deg AV block.  Restart benazepril-hctz (was not intentionally stopped - just ran out). Copies and EKG, labs, CXR given to pt so she can follow-up with her PCP.  Orders Placed This Encounter  Procedures  . DG Chest 2 View    Standing Status: Future     Number of Occurrences: 1      Standing Expiration Date: 03/01/2017    Order Specific Question:  Reason for Exam (SYMPTOM  OR DIAGNOSIS REQUIRED)    Answer:  fever short of breath    Order Specific Question:  Preferred imaging location?    Answer:  External  . Comprehensive metabolic panel  . TSH  . POCT CBC  . POCT SEDIMENTATION RATE  . POCT urinalysis dipstick  . POCT Microscopic Urinalysis ([facility])  . EKG 12-Lead    Meds ordered this encounter  Medications  . Dextromethorphan-Guaifenesin (MUCINEX DM MAXIMUM STRENGTH) 60-1200 MG TB12    Sig: Take 1 tablet by mouth every 12 (twelve) hours.    Dispense:  14 each    Refill:  0  . ipratropium (ATROVENT) 0.03 % nasal spray    Sig: Place 2 sprays into the nose 4 (four) times daily.    Dispense:  30 mL    Refill:  0  . benazepril-hydrochlorthiazide (LOTENSIN HCT) 20-25 MG tablet    Sig: Take 1 tablet by mouth daily.    Dispense:  30 tablet    Refill:  1    I personally performed the services described in this documentation, which was scribed in my presence. The recorded information has been reviewed and considered, and addended by me as needed.  Delman Cheadle, MD MPH

## 2016-01-08 ENCOUNTER — Encounter: Payer: Self-pay | Admitting: Family Medicine

## 2016-01-25 DIAGNOSIS — M25562 Pain in left knee: Secondary | ICD-10-CM | POA: Diagnosis not present

## 2016-01-25 DIAGNOSIS — M170 Bilateral primary osteoarthritis of knee: Secondary | ICD-10-CM | POA: Diagnosis not present

## 2016-01-25 DIAGNOSIS — M25561 Pain in right knee: Secondary | ICD-10-CM | POA: Diagnosis not present

## 2016-02-06 DIAGNOSIS — M170 Bilateral primary osteoarthritis of knee: Secondary | ICD-10-CM | POA: Diagnosis not present

## 2016-02-06 DIAGNOSIS — R2689 Other abnormalities of gait and mobility: Secondary | ICD-10-CM | POA: Diagnosis not present

## 2016-02-06 DIAGNOSIS — R262 Difficulty in walking, not elsewhere classified: Secondary | ICD-10-CM | POA: Diagnosis not present

## 2016-02-06 DIAGNOSIS — M25562 Pain in left knee: Secondary | ICD-10-CM | POA: Diagnosis not present

## 2016-02-06 DIAGNOSIS — M25561 Pain in right knee: Secondary | ICD-10-CM | POA: Diagnosis not present

## 2016-02-13 DIAGNOSIS — M170 Bilateral primary osteoarthritis of knee: Secondary | ICD-10-CM | POA: Diagnosis not present

## 2016-02-13 DIAGNOSIS — M1712 Unilateral primary osteoarthritis, left knee: Secondary | ICD-10-CM | POA: Diagnosis not present

## 2016-02-13 DIAGNOSIS — M25561 Pain in right knee: Secondary | ICD-10-CM | POA: Diagnosis not present

## 2016-02-13 DIAGNOSIS — R2689 Other abnormalities of gait and mobility: Secondary | ICD-10-CM | POA: Diagnosis not present

## 2016-02-13 DIAGNOSIS — M25562 Pain in left knee: Secondary | ICD-10-CM | POA: Diagnosis not present

## 2016-02-23 ENCOUNTER — Other Ambulatory Visit: Payer: Self-pay | Admitting: Family Medicine

## 2016-02-26 DIAGNOSIS — R2689 Other abnormalities of gait and mobility: Secondary | ICD-10-CM | POA: Diagnosis not present

## 2016-02-26 DIAGNOSIS — M25561 Pain in right knee: Secondary | ICD-10-CM | POA: Diagnosis not present

## 2016-02-26 DIAGNOSIS — M170 Bilateral primary osteoarthritis of knee: Secondary | ICD-10-CM | POA: Diagnosis not present

## 2016-02-26 DIAGNOSIS — M25562 Pain in left knee: Secondary | ICD-10-CM | POA: Diagnosis not present

## 2016-02-26 DIAGNOSIS — M1711 Unilateral primary osteoarthritis, right knee: Secondary | ICD-10-CM | POA: Diagnosis not present

## 2016-03-12 DIAGNOSIS — M170 Bilateral primary osteoarthritis of knee: Secondary | ICD-10-CM | POA: Diagnosis not present

## 2016-03-12 DIAGNOSIS — M25562 Pain in left knee: Secondary | ICD-10-CM | POA: Diagnosis not present

## 2016-03-12 DIAGNOSIS — R2689 Other abnormalities of gait and mobility: Secondary | ICD-10-CM | POA: Diagnosis not present

## 2016-03-12 DIAGNOSIS — M1712 Unilateral primary osteoarthritis, left knee: Secondary | ICD-10-CM | POA: Diagnosis not present

## 2016-03-12 DIAGNOSIS — M25561 Pain in right knee: Secondary | ICD-10-CM | POA: Diagnosis not present

## 2016-05-10 DIAGNOSIS — Z6841 Body Mass Index (BMI) 40.0 and over, adult: Secondary | ICD-10-CM | POA: Diagnosis not present

## 2016-05-10 DIAGNOSIS — I10 Essential (primary) hypertension: Secondary | ICD-10-CM | POA: Diagnosis not present

## 2016-05-10 DIAGNOSIS — F330 Major depressive disorder, recurrent, mild: Secondary | ICD-10-CM | POA: Diagnosis not present

## 2016-05-10 DIAGNOSIS — E039 Hypothyroidism, unspecified: Secondary | ICD-10-CM | POA: Diagnosis not present

## 2016-06-27 DIAGNOSIS — I10 Essential (primary) hypertension: Secondary | ICD-10-CM | POA: Diagnosis not present

## 2016-06-27 DIAGNOSIS — N3281 Overactive bladder: Secondary | ICD-10-CM | POA: Diagnosis not present

## 2016-06-27 DIAGNOSIS — F330 Major depressive disorder, recurrent, mild: Secondary | ICD-10-CM | POA: Diagnosis not present

## 2016-06-27 DIAGNOSIS — E039 Hypothyroidism, unspecified: Secondary | ICD-10-CM | POA: Diagnosis not present

## 2016-07-31 ENCOUNTER — Other Ambulatory Visit: Payer: Self-pay | Admitting: Surgery

## 2016-07-31 DIAGNOSIS — Z85850 Personal history of malignant neoplasm of thyroid: Secondary | ICD-10-CM

## 2016-08-03 DIAGNOSIS — J029 Acute pharyngitis, unspecified: Secondary | ICD-10-CM | POA: Diagnosis not present

## 2016-08-06 ENCOUNTER — Other Ambulatory Visit: Payer: Medicare Other

## 2017-03-10 ENCOUNTER — Telehealth: Payer: Self-pay

## 2017-03-10 NOTE — Telephone Encounter (Signed)
Pt requesting a follow up visit with Dr. Denman George for new abdominal pain. Pt feels that she may have recurrence of her ovarian cysts. She saw Dr. Denman George a year ago for this problem. Told Ms Cisar that Dr. Denman George is a specialist in gyn oncology.  She would need to be evaluated by her PCP or ob/gyn and referred to Dr. Denman George if needed.

## 2017-04-30 ENCOUNTER — Other Ambulatory Visit: Payer: Self-pay | Admitting: Family Medicine

## 2017-04-30 DIAGNOSIS — R109 Unspecified abdominal pain: Secondary | ICD-10-CM

## 2017-05-02 ENCOUNTER — Ambulatory Visit
Admission: RE | Admit: 2017-05-02 | Discharge: 2017-05-02 | Disposition: A | Discharge status: Home / Self Care | Payer: Medicare Other | Source: Ambulatory Visit | Attending: Family Medicine | Admitting: Family Medicine

## 2017-05-02 DIAGNOSIS — R109 Unspecified abdominal pain: Secondary | ICD-10-CM

## 2017-05-02 MED ORDER — IOPAMIDOL (ISOVUE-300) INJECTION 61%
125.0000 mL | Freq: Once | INTRAVENOUS | Status: AC | PRN
  Administered 2017-05-02: 125 mL via INTRAVENOUS

## 2017-07-10 ENCOUNTER — Other Ambulatory Visit (HOSPITAL_BASED_OUTPATIENT_CLINIC_OR_DEPARTMENT_OTHER): Payer: Self-pay

## 2017-07-10 DIAGNOSIS — G4730 Sleep apnea, unspecified: Secondary | ICD-10-CM

## 2017-07-10 DIAGNOSIS — J441 Chronic obstructive pulmonary disease with (acute) exacerbation: Secondary | ICD-10-CM

## 2017-07-30 ENCOUNTER — Other Ambulatory Visit: Payer: Self-pay | Admitting: Sports Medicine

## 2017-07-30 DIAGNOSIS — M545 Low back pain: Secondary | ICD-10-CM

## 2017-08-08 ENCOUNTER — Ambulatory Visit
Admission: RE | Admit: 2017-08-08 | Discharge: 2017-08-08 | Disposition: A | Discharge status: Home / Self Care | Payer: Medicare Other | Source: Ambulatory Visit | Attending: Sports Medicine | Admitting: Sports Medicine

## 2017-08-08 DIAGNOSIS — M545 Low back pain: Secondary | ICD-10-CM

## 2017-08-21 ENCOUNTER — Encounter (HOSPITAL_BASED_OUTPATIENT_CLINIC_OR_DEPARTMENT_OTHER): Payer: Medicare Other

## 2017-09-24 ENCOUNTER — Ambulatory Visit (HOSPITAL_BASED_OUTPATIENT_CLINIC_OR_DEPARTMENT_OTHER): Payer: Medicare Other | Attending: Family Medicine | Admitting: Internal Medicine

## 2017-09-24 VITALS — Ht 67.0 in | Wt 290.0 lb

## 2017-09-24 DIAGNOSIS — G4730 Sleep apnea, unspecified: Secondary | ICD-10-CM

## 2017-09-24 DIAGNOSIS — J449 Chronic obstructive pulmonary disease, unspecified: Secondary | ICD-10-CM | POA: Insufficient documentation

## 2017-09-24 DIAGNOSIS — R0681 Apnea, not elsewhere classified: Secondary | ICD-10-CM | POA: Diagnosis present

## 2017-09-24 DIAGNOSIS — G4733 Obstructive sleep apnea (adult) (pediatric): Secondary | ICD-10-CM | POA: Diagnosis not present

## 2017-09-24 DIAGNOSIS — J441 Chronic obstructive pulmonary disease with (acute) exacerbation: Secondary | ICD-10-CM

## 2017-10-04 DIAGNOSIS — G4730 Sleep apnea, unspecified: Secondary | ICD-10-CM | POA: Diagnosis not present

## 2017-10-04 NOTE — Procedures (Signed)
Patient Name: Natasha [name], Natasha [name] Date: 09/24/2017 Gender: Female D.O.B: 08-Apr-1951 Age (years): 67 Referring Provider: Lucianne Lei Height (inches): 77 Interpreting Physician: Baird Lyons MD, ABSM Weight (lbs): 290 RPSGT: Jorge Ny BMI: 45 MRN: [id number] Neck Size: 16.50 <br> <br> CLINICAL INFORMATION Sleep Study Type: Split Night CPAP Indication for sleep study: Hypertension, Obesity, OSA  Epworth Sleepiness Score: 11  SLEEP STUDY TECHNIQUE As per the AASM Manual for the Scoring of Sleep and Associated Events v2.3 (April 2016) with a hypopnea requiring 4% desaturations.  The channels recorded and monitored were frontal, central and occipital EEG, electrooculogram (EOG), submentalis EMG (chin), nasal and oral airflow, thoracic and abdominal wall motion, anterior tibialis EMG, snore microphone, electrocardiogram, and pulse oximetry. Continuous positive airway pressure (CPAP) was initiated when the patient met split night criteria and was titrated according to treat sleep-disordered breathing.  MEDICATIONS Medications self-administered by patient taken the night of the study : VALSARTAN, MYRBETRIQ, NOREL PLUS, INSTAFLEX  RESPIRATORY PARAMETERS Diagnostic  Total AHI (/hr): 68.5 RDI (/hr): 77.7 OA Index (/hr): 20.1 CA Index (/hr): 0.0 REM AHI (/hr): N/A NREM AHI (/hr): 68.5 Supine AHI (/hr): 155.8 Non-supine AHI (/hr): 64.92 Min O2 Sat (%): 85.00 Mean O2 (%): 90.65 Time below 88% (min): 14.1   Titration  Optimal Pressure (cm): 15 AHI at Optimal Pressure (/hr): 4.6 Min O2 at Optimal Pressure (%): 89.0 Supine % at Optimal (%): 100 Sleep % at Optimal (%): 95   SLEEP ARCHITECTURE The recording time for the entire night was 415.5 minutes.  During a baseline period of 126.5 minutes, the patient slept for 110.4 minutes in REM and nonREM, yielding a sleep efficiency of 87.3%. Sleep onset after lights out was 6.1 minutes with a REM latency of N/A minutes. The patient  spent 3.62% of the night in stage N1 sleep, 96.38% in stage N2 sleep, 0.00% in stage N3 and 0.00% in REM.  During the titration period of 272.3 minutes, the patient slept for 243.5 minutes in REM and nonREM, yielding a sleep efficiency of 89.4%. Sleep onset after CPAP initiation was 11.4 minutes with a REM latency of 86.0 minutes. The patient spent 4.31% of the night in stage N1 sleep, 70.43% in stage N2 sleep, 0.00% in stage N3 and 25.26% in REM.  CARDIAC DATA The 2 lead EKG demonstrated sinus rhythm. The mean heart rate was 61.54 beats per minute. Other EKG findings include: PVCs.  LEG MOVEMENT DATA The total Periodic Limb Movements of Sleep (PLMS) were 44. The PLMS index was 7.46 .  IMPRESSIONS - Severe obstructive sleep apnea occurred during the diagnostic portion of the study (AHI = 68.5/hour). An optimal PAP pressure was selected for this patient ( 15 cm of water) - No significant central sleep apnea occurred during the diagnostic portion of the study (CAI = 0.0/hour). - Severe oxygen desaturation was noted during the diagnostic portion of the study (Min O2 = 85.00%). - The patient snored with moderate snoring volume during the diagnostic portion of the study. - EKG findings include PVCs. - Clinically significant periodic limb movements did not occur during sleep.  DIAGNOSIS - Obstructive Sleep Apnea (327.23 [G47.33 ICD-10])  RECOMMENDATIONS - Trial of CPAP therapy on 15 cm H2O with a Medium size Resmed Full Face Mask AirFit F20 mask and heated humidification. - Be careful with alcohol, sedatives and other CNS depressants that may worsen sleep apnea and disrupt normal sleep architecture. - Sleep hygiene should be reviewed to assess factors that may improve sleep quality. - Weight  management and regular exercise should be initiated or continued.  [Electronically signed] 10/04/2017 11:48 AM  Baird Lyons MD, ABSM Diplomate, American Board of Sleep Medicine   NPI: [id number]                           [place] [place] [facility]  ELECTRONICALLY SIGNED ON:  10/04/2017, 11:47 AM [place] PH: [phone]   FX: [phone] [place]

## 2018-01-28 ENCOUNTER — Other Ambulatory Visit: Payer: Self-pay | Admitting: Chiropractic Medicine

## 2018-01-28 DIAGNOSIS — M25569 Pain in unspecified knee: Secondary | ICD-10-CM

## 2018-02-03 ENCOUNTER — Emergency Department (HOSPITAL_COMMUNITY)
Admission: EM | Admit: 2018-02-03 | Discharge: 2018-02-04 | Disposition: A | Discharge status: Home / Self Care | Payer: Medicare Other | Attending: Emergency Medicine | Admitting: Emergency Medicine

## 2018-02-03 ENCOUNTER — Emergency Department (HOSPITAL_COMMUNITY): Payer: Medicare Other

## 2018-02-03 ENCOUNTER — Other Ambulatory Visit: Payer: Self-pay

## 2018-02-03 ENCOUNTER — Ambulatory Visit
Admission: RE | Admit: 2018-02-03 | Discharge: 2018-02-03 | Disposition: A | Discharge status: Home / Self Care | Payer: Medicare Other | Source: Ambulatory Visit | Attending: Chiropractic Medicine | Admitting: Chiropractic Medicine

## 2018-02-03 DIAGNOSIS — R600 Localized edema: Secondary | ICD-10-CM | POA: Insufficient documentation

## 2018-02-03 DIAGNOSIS — Z85850 Personal history of malignant neoplasm of thyroid: Secondary | ICD-10-CM | POA: Insufficient documentation

## 2018-02-03 DIAGNOSIS — M79605 Pain in left leg: Secondary | ICD-10-CM | POA: Insufficient documentation

## 2018-02-03 DIAGNOSIS — R0602 Shortness of breath: Secondary | ICD-10-CM | POA: Diagnosis not present

## 2018-02-03 DIAGNOSIS — E039 Hypothyroidism, unspecified: Secondary | ICD-10-CM | POA: Insufficient documentation

## 2018-02-03 DIAGNOSIS — I825Y2 Chronic embolism and thrombosis of unspecified deep veins of left proximal lower extremity: Secondary | ICD-10-CM | POA: Diagnosis not present

## 2018-02-03 DIAGNOSIS — I10 Essential (primary) hypertension: Secondary | ICD-10-CM | POA: Diagnosis not present

## 2018-02-03 DIAGNOSIS — Z79899 Other long term (current) drug therapy: Secondary | ICD-10-CM | POA: Diagnosis not present

## 2018-02-03 DIAGNOSIS — M25569 Pain in unspecified knee: Secondary | ICD-10-CM

## 2018-02-03 DIAGNOSIS — M79604 Pain in right leg: Secondary | ICD-10-CM | POA: Diagnosis present

## 2018-02-03 LAB — CBC WITH DIFFERENTIAL/PLATELET
ABS IMMATURE GRANULOCYTES: 0 10*3/uL (ref 0.0–0.1)
Basophils Absolute: 0.1 10*3/uL (ref 0.0–0.1)
Basophils Relative: 1 %
EOS PCT: 3 %
Eosinophils Absolute: 0.2 10*3/uL (ref 0.0–0.7)
HEMATOCRIT: 41.1 % (ref 36.0–46.0)
HEMOGLOBIN: 13.2 g/dL (ref 12.0–15.0)
Immature Granulocytes: 0 %
LYMPHS ABS: 2.6 10*3/uL (ref 0.7–4.0)
LYMPHS PCT: 36 %
MCH: 29.3 pg (ref 26.0–34.0)
MCHC: 32.1 g/dL (ref 30.0–36.0)
MCV: 91.3 fL (ref 78.0–100.0)
MONO ABS: 0.6 10*3/uL (ref 0.1–1.0)
Monocytes Relative: 9 %
Neutro Abs: 3.6 10*3/uL (ref 1.7–7.7)
Neutrophils Relative %: 51 %
Platelets: 277 10*3/uL (ref 150–400)
RBC: 4.5 MIL/uL (ref 3.87–5.11)
RDW: 14.3 % (ref 11.5–15.5)
WBC: 7.2 10*3/uL (ref 4.0–10.5)

## 2018-02-03 LAB — BASIC METABOLIC PANEL
Anion gap: 9 (ref 5–15)
BUN: 14 mg/dL (ref 6–20)
CHLORIDE: 104 mmol/L (ref 101–111)
CO2: 26 mmol/L (ref 22–32)
CREATININE: 0.91 mg/dL (ref 0.44–1.00)
Calcium: 9.6 mg/dL (ref 8.9–10.3)
GFR calc Af Amer: >60 mL/min (ref >60)
GFR calc non Af Amer: >60 mL/min (ref >60)
Glucose, Bld: 99 mg/dL (ref 65–99)
POTASSIUM: 3.5 mmol/L (ref 3.5–5.1)
Sodium: 139 mmol/L (ref 135–145)

## 2018-02-03 MED ORDER — IOPAMIDOL (ISOVUE-370) INJECTION 76%
INTRAVENOUS | Status: AC
  Filled 2018-02-03: qty 100

## 2018-02-03 MED ORDER — IOPAMIDOL (ISOVUE-370) INJECTION 76%
100.0000 mL | Freq: Once | INTRAVENOUS | Status: AC | PRN
  Administered 2018-02-03: 100 mL via INTRAVENOUS

## 2018-02-03 NOTE — ED Provider Notes (Addendum)
Patient placed in Quick Look pathway, seen and evaluated   Chief Complaint: left leg pain  HPI:   Natasha [name] is a 67 y.o. female who presents to the ED with left leg pain. Patient reports that she has been having pain for a week now but thought it was coming from her knee. The pain got worse and she went to her doctor who did a DVT study and called and told her to come to the ED.   ROS: M/S left leg pain  Physical Exam:  BP 137/87 (BP Location: Right Arm)   Pulse 77   Temp 97.9 F (36.6 C) (Oral)   Resp 16   SpO2 100%    Gen: No distress  Neuro: Awake and Alert  Skin: Warm and dry  M/S: left calf tenderness that radiates to the popliteal area. Pedal pulse palpated.    US Venous Img Lower Bilateral  Addendum Date: 02/03/2018   ADDENDUM REPORT: 02/03/2018 19:35 ADDENDUM: Initial attempts to contact the referring provider were unsuccessful. Therefore, the findings were discussed with the patient herself at 7:15 p.m. on 02/03/2018. The patient stated that her primary care physician was Dr. Lucianne Lei. The on-call physician for Dr. Fransico Setters office was contacted and the above findings discussed with Dr. Iona Beard at 7:25 p.m. on 02/03/2018. Shortly thereafter, the initial referring provider, Dr. Steffanie Dunn, called and the findings were subsequently discussed with him as well at 7:32 p.m. on 02/03/2018. Electronically Signed   By: Titus Dubin M.D.   On: 02/03/2018 19:35   Result Date: 02/03/2018 CLINICAL DATA:  67 year old female with bilateral lower extremity pain and swelling EXAM: BILATERAL LOWER EXTREMITY VENOUS DOPPLER ULTRASOUND TECHNIQUE: Gray-scale sonography with graded compression, as well as color Doppler and duplex ultrasound were performed to evaluate the lower extremity deep venous systems from the level of the common femoral vein and including the common femoral, femoral, profunda femoral, popliteal and calf veins including the posterior tibial, peroneal and gastrocnemius  veins when visible. The superficial great saphenous vein was also interrogated. Spectral Doppler was utilized to evaluate flow at rest and with distal augmentation maneuvers in the common femoral, femoral and popliteal veins. COMPARISON:  None. FINDINGS: RIGHT LOWER EXTREMITY Common Femoral Vein: No evidence of thrombus. Normal compressibility, respiratory phasicity and response to augmentation. Saphenofemoral Junction: No evidence of thrombus. Normal compressibility and flow on color Doppler imaging. Profunda Femoral Vein: No evidence of thrombus. Normal compressibility and flow on color Doppler imaging. Femoral Vein: No evidence of thrombus. Normal compressibility, respiratory phasicity and response to augmentation. Popliteal Vein: No evidence of thrombus. Normal compressibility, respiratory phasicity and response to augmentation. Calf Veins: No evidence of thrombus. Normal compressibility and flow on color Doppler imaging. Superficial Great Saphenous Vein: No evidence of thrombus. Normal compressibility. Venous Reflux:  None. Other Findings:  None. LEFT LOWER EXTREMITY Common Femoral Vein: No evidence of thrombus. Normal compressibility, respiratory phasicity and response to augmentation. Saphenofemoral Junction: No evidence of thrombus. Normal compressibility and flow on color Doppler imaging. Profunda Femoral Vein: No evidence of thrombus. Normal compressibility and flow on color Doppler imaging. Femoral Vein: No evidence of thrombus. Normal compressibility, respiratory phasicity and response to augmentation. Popliteal Vein: The popliteal vein is not compressible. The lumen is filled with heterogeneous echogenicity. There is evidence of color flow on color Doppler imaging. Calf Veins: No evidence of thrombus. Normal compressibility and flow on color Doppler imaging. Superficial Great Saphenous Vein: No evidence of thrombus. Normal compressibility. Venous Reflux:  None. Other  Findings:  None. IMPRESSION: 1.  Positive for either chronic and partially recanalized DVT, or acute nonocclusive DVT in the left popliteal vein. Given the sonographic appearance, chronic and partially recanalized DVT is favored. 2. No evidence of acute or chronic DVT on the right. These results will be called to the ordering clinician or representative by the Radiologist Assistant, and communication documented in the PACS or zVision Dashboard. Electronically Signed: By: Jacqulynn Cadet M.D. On: 02/03/2018 16:28      Initiation of care has begun. The patient has been counseled on the process, plan, and necessity for staying for the completion/evaluation, and the remainder of the medical screening examination    Ashley Murrain, NP 02/03/18 2013  Patient now reporting that since the DVT study she has started to feel short of breath.   Debroah Baller [place], [place] 02/03/18 2016    Margette Fast, MD 02/04/18 [phone]

## 2018-02-03 NOTE — ED Triage Notes (Signed)
Patient here for for bilateral leg pain; went to have a vascular study today and dx with a blood clot in left leg. Not placed on prophylactic anticoagulant yet. Also c/o SOB.

## 2018-02-04 MED ORDER — FUROSEMIDE 20 MG PO TABS: ORAL_TABLET | ORAL | 0 refills | Status: DC

## 2018-02-04 MED ORDER — RIVAROXABAN (XARELTO) VTE STARTER PACK (15 & 20 MG): ORAL_TABLET | ORAL | 0 refills | Status: DC

## 2018-02-04 MED ORDER — RIVAROXABAN 15 MG PO TABS
15.0000 mg | ORAL_TABLET | Freq: Once | ORAL | Status: AC
Start: 2018-02-04 — End: 2018-02-04
  Administered 2018-02-04: 15 mg via ORAL
  Filled 2018-02-04: qty 1

## 2018-02-04 NOTE — ED Provider Notes (Signed)
[facility] EMERGENCY DEPARTMENT Provider Note   CSN: [id number] Arrival date & time: 02/03/18  1943     History   Chief Complaint Chief Complaint  Patient presents with  . Leg Pain    bilateral    HPI Natasha [name] is a 67 y.o. female.  Patient presents to the emergency department with a chief complaint of chronic bilateral lower extremity pain.  She states that she was seen by her regular doctor and was referred for outpatient DVT study, which was positive.  She was encouraged to come to the emergency department because she has had intermittent episodes of chest pain and shortness of breath over the past couple weeks.  She is not anticoagulated.  She has never had a PE or DVT before.  She does travel frequently, and flies often.  She denies any fever, chills, or trauma.  She denies any other associated symptoms.  Her symptoms are worsened when she sits.  The history is provided by the patient. No language interpreter was used.    Past Medical History:  Diagnosis Date  . 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 Complication of anesthesia    trouble with airway  . Edema 12/22/2008  . HYPERTENSION 12/25/2007  . HYPOTHYROIDISM, POSTSURGICAL 05/04/2008  . LEG PAIN, LEFT 12/09/2008  . OSA (obstructive sleep apnea) 06/15/2013    Patient Active Problem List   Diagnosis Date Noted  . OSA (obstructive sleep apnea) 06/15/2013  . ALLERGIC RHINITIS 01/21/2009  . EDEMA 12/22/2008  . LEG PAIN, LEFT 12/09/2008  . DISTURBANCE OF SKIN SENSATION 12/05/2008  . NAUSEA AND VOMITING 05/23/2008  . CARCINOMA, THYROID GLAND, PAPILLARY 05/04/2008  . HYPOTHYROIDISM, POSTSURGICAL 05/04/2008  . HYPERTENSION 12/25/2007  . DYSPNEA 12/25/2007  . COUGH 12/25/2007    Past Surgical History:  Procedure Laterality Date  .  ABDOMINAL HYSTERECTOMY    . ANKLE SURGERY    . CERVICAL FUSION     x 2  . COLONOSCOPY WITH PROPOFOL N/A 08/08/2015   Procedure: COLONOSCOPY WITH PROPOFOL;  Surgeon: Juanita Craver, MD;  Location: [facility];  Service: Endoscopy;  Laterality: N/A;  . KNEE SURGERY    . TOTAL THYROIDECTOMY       OB History   None      Home Medications    Prior to Admission medications   Medication Sig Start Date End Date Taking? Authorizing Provider  Dextromethorphan-Guaifenesin (MUCINEX DM MAXIMUM STRENGTH) 60-1200 MG TB12 Take 1 tablet by mouth every 12 (twelve) hours. 12/31/15  Yes Shawnee Knapp, MD  diltiazem (CARDIZEM CD) 180 MG 24 hr capsule Take 180 mg by mouth daily.     Yes [provider]  ibuprofen (ADVIL,MOTRIN) 600 MG tablet Take 1 tablet (600 mg total) by mouth every 6 (six) hours as needed. 11/01/15  Yes Everitt Amber, MD  ipratropium (ATROVENT) 0.03 % nasal spray Place 2 sprays into the nose 4 (four) times daily. 12/31/15  Yes Shawnee Knapp, MD  Multiple Vitamin (MULTIVITAMIN WITH MINERALS) TABS tablet Take 1 tablet by mouth daily.   Yes [provider]  MYRBETRIQ 25 MG TB24 tablet Take 25 mg by mouth daily. 12/14/17  Yes [provider]  Omega-3 Fatty Acids (FISH OIL PO) Take 1 capsule by mouth daily.   Yes [provider]  OVER THE COUNTER MEDICATION  Take 1 tablet by mouth daily. [employer] Cal-Mag-Vit.D   Yes [provider]  SYNTHROID 112 MCG tablet Take 112 mcg by mouth daily. 06/27/15  Yes [provider]  valsartan-hydrochlorothiazide (DIOVAN-HCT) 160-12.5 MG tablet Take 1 tablet by mouth daily. 01/20/18  Yes [provider]  benazepril-hydrochlorthiazide (LOTENSIN HCT) 20-25 MG tablet TAKE 1 TABLET BY MOUTH DAILY. Patient not taking: Reported on 02/03/2018 02/25/16   Shawnee Knapp, MD    Family History Family History  Problem Relation Age of Onset  . Heart disease Mother   . Kidney disease Mother     Social History Social  History   Tobacco Use  . Smoking status: Never Smoker  Substance Use Topics  . Alcohol use: No  . Drug use: No     Allergies   Codeine   Review of Systems Review of Systems  All other systems reviewed and are negative.    Physical Exam Updated Vital Signs BP (!) 152/83 (BP Location: Left Arm)   Pulse (!) 55   Temp 98.1 F (36.7 C) (Oral)   Resp 20   Ht 5\' 7"  (1.702 m)   Wt 131.5 kg (290 lb)   SpO2 100%   BMI 45.42 kg/m   Physical Exam  Constitutional: She is oriented to person, place, and time. She appears well-developed and well-nourished.  HENT:  Head: Normocephalic and atraumatic.  Eyes: Pupils are equal, round, and reactive to light. Conjunctivae and EOM are normal.  Neck: Normal range of motion. Neck supple.  Cardiovascular: Normal rate and regular rhythm. Exam reveals no gallop and no friction rub.  No murmur heard. Pulmonary/Chest: Effort normal and breath sounds normal. No respiratory distress. She has no wheezes. She has no rales. She exhibits no tenderness.  Abdominal: Soft. Bowel sounds are normal. She exhibits no distension and no mass. There is no tenderness. There is no rebound and no guarding.  Musculoskeletal: Normal range of motion. She exhibits edema. She exhibits no tenderness.  Trace bilateral pitting edema.  Neurological: She is alert and oriented to person, place, and time.  Skin: Skin is warm and dry.  Psychiatric: She has a normal mood and affect. Her behavior is normal. Judgment and thought content normal.  Nursing note and vitals reviewed.    ED Treatments / Results  Labs (all labs ordered are listed, but only abnormal results are displayed) Labs Reviewed  CBC WITH DIFFERENTIAL/PLATELET  BASIC METABOLIC PANEL    EKG EKG Interpretation  Date/Time:  Tuesday February 03 2018 19:59:12 EDT Ventricular Rate:  77 PR Interval:  200 QRS Duration: 98 QT Interval:  362 QTC Calculation: 409 R Axis:   -20 Text Interpretation:  Normal  sinus rhythm Anterior infarct , age undetermined Abnormal ECG When compared with ECG of 04/08/2008, Fusion complexes are no longer present Confirmed by Delora Fuel ([id number]) on 02/03/2018 10:58:17 PM   Radiology Ct Angio Chest Pe W And/or Wo Contrast  Result Date: 02/03/2018 CLINICAL DATA:  Shortness of breath.  Left lower extremity DVT. EXAM: CT ANGIOGRAPHY CHEST WITH CONTRAST TECHNIQUE: Multidetector CT imaging of the chest was performed using the standard protocol during bolus administration of intravenous contrast. Multiplanar CT image reconstructions and MIPs were obtained to evaluate the vascular anatomy. CONTRAST:  148mL ISOVUE-370 IOPAMIDOL (ISOVUE-370) INJECTION 76% COMPARISON:  CTA chest dated December 28, 2007. FINDINGS: Cardiovascular: Satisfactory opacification of the pulmonary arteries to the segmental level. No evidence of pulmonary embolism. Mild cardiomegaly, unchanged. No pericardial effusion. Normal caliber thoracic aorta. Mediastinum/Nodes: No  enlarged mediastinal, hilar, or axillary lymph nodes. Prior total thyroidectomy. The trachea and esophagus demonstrate no significant findings. Lungs/Pleura: Mild bibasilar atelectasis. No focal consolidation, pleural effusion, or pneumothorax. New 9 x 8 mm pulmonary nodule in the right middle lobe along the minor fissure (series 7, image 30). New 4 mm pulmonary nodule in the left upper lobe (series 6, image 29). Upper Abdomen: No acute abnormality. Musculoskeletal: No chest wall abnormality. No acute or significant osseous findings. Degenerative changes of the thoracic spine. Review of the MIP images confirms the above findings. IMPRESSION: 1. No evidence of pulmonary embolism. 2. New 9 x 8 mm pulmonary nodule in the right middle lobe. Consider one of the following in 3 months for both low-risk and high-risk individuals: (a) repeat chest CT, (b) follow-up PET-CT, or (c) tissue sampling. This recommendation follows the consensus statement: Guidelines for  Management of Incidental Pulmonary Nodules Detected on CT Images: From the Fleischner Society 2017; Radiology 2017; 284:228-243. Electronically Signed   By: Titus Dubin M.D.   On: 02/03/2018 21:54   US Venous Img Lower Bilateral  Addendum Date: 02/03/2018   ADDENDUM REPORT: 02/03/2018 19:35 ADDENDUM: Initial attempts to contact the referring provider were unsuccessful. Therefore, the findings were discussed with the patient herself at 7:15 p.m. on 02/03/2018. The patient stated that her primary care physician was Dr. Lucianne Lei. The on-call physician for Dr. Fransico Setters office was contacted and the above findings discussed with Dr. Iona Beard at 7:25 p.m. on 02/03/2018. Shortly thereafter, the initial referring provider, Dr. Steffanie Dunn, called and the findings were subsequently discussed with him as well at 7:32 p.m. on 02/03/2018. Electronically Signed   By: Titus Dubin M.D.   On: 02/03/2018 19:35   Result Date: 02/03/2018 CLINICAL DATA:  67 year old female with bilateral lower extremity pain and swelling EXAM: BILATERAL LOWER EXTREMITY VENOUS DOPPLER ULTRASOUND TECHNIQUE: Gray-scale sonography with graded compression, as well as color Doppler and duplex ultrasound were performed to evaluate the lower extremity deep venous systems from the level of the common femoral vein and including the common femoral, femoral, profunda femoral, popliteal and calf veins including the posterior tibial, peroneal and gastrocnemius veins when visible. The superficial great saphenous vein was also interrogated. Spectral Doppler was utilized to evaluate flow at rest and with distal augmentation maneuvers in the common femoral, femoral and popliteal veins. COMPARISON:  None. FINDINGS: RIGHT LOWER EXTREMITY Common Femoral Vein: No evidence of thrombus. Normal compressibility, respiratory phasicity and response to augmentation. Saphenofemoral Junction: No evidence of thrombus. Normal compressibility and flow on color  Doppler imaging. Profunda Femoral Vein: No evidence of thrombus. Normal compressibility and flow on color Doppler imaging. Femoral Vein: No evidence of thrombus. Normal compressibility, respiratory phasicity and response to augmentation. Popliteal Vein: No evidence of thrombus. Normal compressibility, respiratory phasicity and response to augmentation. Calf Veins: No evidence of thrombus. Normal compressibility and flow on color Doppler imaging. Superficial Great Saphenous Vein: No evidence of thrombus. Normal compressibility. Venous Reflux:  None. Other Findings:  None. LEFT LOWER EXTREMITY Common Femoral Vein: No evidence of thrombus. Normal compressibility, respiratory phasicity and response to augmentation. Saphenofemoral Junction: No evidence of thrombus. Normal compressibility and flow on color Doppler imaging. Profunda Femoral Vein: No evidence of thrombus. Normal compressibility and flow on color Doppler imaging. Femoral Vein: No evidence of thrombus. Normal compressibility, respiratory phasicity and response to augmentation. Popliteal Vein: The popliteal vein is not compressible. The lumen is filled with heterogeneous echogenicity. There is evidence of color flow on color Doppler imaging.  Calf Veins: No evidence of thrombus. Normal compressibility and flow on color Doppler imaging. Superficial Great Saphenous Vein: No evidence of thrombus. Normal compressibility. Venous Reflux:  None. Other Findings:  None. IMPRESSION: 1. Positive for either chronic and partially recanalized DVT, or acute nonocclusive DVT in the left popliteal vein. Given the sonographic appearance, chronic and partially recanalized DVT is favored. 2. No evidence of acute or chronic DVT on the right. These results will be called to the ordering clinician or representative by the Radiologist Assistant, and communication documented in the PACS or zVision Dashboard. Electronically Signed: By: Jacqulynn Cadet M.D. On: 02/03/2018 16:28     Procedures Procedures (including critical care time)  Medications Ordered in ED Medications  iopamidol (ISOVUE-370) 76 % injection (has no administration in time range)  Rivaroxaban (XARELTO) tablet 15 mg (has no administration in time range)  iopamidol (ISOVUE-370) 76 % injection 100 mL (100 mLs Intravenous Contrast Given 02/03/18 2120)     Initial Impression / Assessment and Plan / ED Course  I have reviewed the triage vital signs and the nursing notes.  Pertinent labs & imaging results that were available during my care of the patient were reviewed by me and considered in my medical decision making (see chart for details).     Patient here after recent diagnosis of left lower extremity DVT.  DVT is chronic, and possibly has been recanalized.  She has had intermittent chest pains and shortness of breath, but has a negative PE study today.  She is not hypoxic nor tachycardic.  Plan to start patient on Xarelto.   Patient seen by discussed with Dr. Roxanne Mins, who agrees with treatment plan and discharge.  Final Clinical Impressions(s) / ED Diagnoses   Final diagnoses:  Chronic deep vein thrombosis (DVT) of proximal vein of left lower extremity ([place])  Lower extremity edema    ED Discharge Orders        Ordered    Rivaroxaban 15 & 20 MG TBPK     02/04/18 0110    furosemide (LASIX) 20 MG tablet     02/04/18 0110       Montine Circle, PA-C 73/42/87 [id number]    Delora Fuel, MD 57/26/20 [phone]

## 2018-02-14 ENCOUNTER — Emergency Department (HOSPITAL_COMMUNITY)
Admission: EM | Admit: 2018-02-14 | Discharge: 2018-02-14 | Disposition: A | Discharge status: Home / Self Care | Payer: Medicare Other | Attending: Emergency Medicine | Admitting: Emergency Medicine

## 2018-02-14 ENCOUNTER — Other Ambulatory Visit: Payer: Self-pay

## 2018-02-14 ENCOUNTER — Emergency Department (HOSPITAL_COMMUNITY): Payer: Medicare Other

## 2018-02-14 ENCOUNTER — Encounter (HOSPITAL_COMMUNITY): Payer: Self-pay | Admitting: Emergency Medicine

## 2018-02-14 DIAGNOSIS — R202 Paresthesia of skin: Secondary | ICD-10-CM

## 2018-02-14 DIAGNOSIS — R0602 Shortness of breath: Secondary | ICD-10-CM | POA: Diagnosis present

## 2018-02-14 DIAGNOSIS — Z79899 Other long term (current) drug therapy: Secondary | ICD-10-CM | POA: Insufficient documentation

## 2018-02-14 DIAGNOSIS — I10 Essential (primary) hypertension: Secondary | ICD-10-CM | POA: Insufficient documentation

## 2018-02-14 DIAGNOSIS — E039 Hypothyroidism, unspecified: Secondary | ICD-10-CM | POA: Diagnosis not present

## 2018-02-14 DIAGNOSIS — R0609 Other forms of dyspnea: Secondary | ICD-10-CM | POA: Insufficient documentation

## 2018-02-14 LAB — CBC
HEMATOCRIT: 38.7 % (ref 36.0–46.0)
Hemoglobin: 12.3 g/dL (ref 12.0–15.0)
MCH: 28.9 pg (ref 26.0–34.0)
MCHC: 31.8 g/dL (ref 30.0–36.0)
MCV: 91.1 fL (ref 78.0–100.0)
Platelets: 281 10*3/uL (ref 150–400)
RBC: 4.25 MIL/uL (ref 3.87–5.11)
RDW: 14.2 % (ref 11.5–15.5)
WBC: 6.9 10*3/uL (ref 4.0–10.5)

## 2018-02-14 LAB — BASIC METABOLIC PANEL
Anion gap: 9 (ref 5–15)
BUN: 9 mg/dL (ref 6–20)
CHLORIDE: 106 mmol/L (ref 101–111)
CO2: 26 mmol/L (ref 22–32)
Calcium: 9.2 mg/dL (ref 8.9–10.3)
Creatinine, Ser: 0.84 mg/dL (ref 0.44–1.00)
GFR calc Af Amer: >60 mL/min (ref >60)
GFR calc non Af Amer: >60 mL/min (ref >60)
GLUCOSE: 107 mg/dL — AB (ref 65–99)
POTASSIUM: 3.5 mmol/L (ref 3.5–5.1)
Sodium: 141 mmol/L (ref 135–145)

## 2018-02-14 LAB — I-STAT TROPONIN, ED: Troponin i, poc: 0 ng/mL (ref 0.00–0.08)

## 2018-02-14 MED ORDER — TRAMADOL HCL 50 MG PO TABS: 50.0000 mg | ORAL_TABLET | Freq: Four times a day (QID) | ORAL | 0 refills | Status: DC | PRN

## 2018-02-14 MED ORDER — IOPAMIDOL (ISOVUE-370) INJECTION 76%
100.0000 mL | Freq: Once | INTRAVENOUS | Status: AC | PRN
  Administered 2018-02-14: 100 mL via INTRAVENOUS

## 2018-02-14 MED ORDER — IOPAMIDOL (ISOVUE-370) INJECTION 76%
INTRAVENOUS | Status: AC
  Filled 2018-02-14: qty 100

## 2018-02-14 MED ORDER — SODIUM CHLORIDE 0.9 % IV BOLUS
1000.0000 mL | Freq: Once | INTRAVENOUS | Status: AC
  Administered 2018-02-14: 1000 mL via INTRAVENOUS

## 2018-02-14 MED ORDER — ALBUTEROL SULFATE HFA 108 (90 BASE) MCG/ACT IN AERS
2.0000 | INHALATION_SPRAY | RESPIRATORY_TRACT | Status: DC | PRN
  Administered 2018-02-14: 2 via RESPIRATORY_TRACT
  Filled 2018-02-14: qty 6.7

## 2018-02-14 NOTE — ED Notes (Signed)
Patient transported to X-ray 

## 2018-02-14 NOTE — ED Notes (Signed)
Patient transported to CT 

## 2018-02-14 NOTE — ED Notes (Signed)
Pt verbalizes understanding of d/c instructions. Pt received prescriptions. Pt taken to lobby in wheelchair at d/c with all belongings and with family.   

## 2018-02-14 NOTE — ED Provider Notes (Signed)
Patient signed out to me at shift change.  Patient pending CT PE study.  Patient has recent diagnosis of left lower extremity DVT and is complaining of some persistent left lower extremity pain.  She also reports having had some slight shortness of breath.  She is anticoagulated on Xarelto, and has been taking her medication as directed.  CT scan is negative for PE.  I again discussed the pulmonary nodule with the patient, and she agrees with the appropriate follow-up plan.  Her oxygen level has been doing 91 and 96% while speaking with her at the bedside.  She has no wheezing or other adventitious lung sounds.  Patient states that she does have sleep apnea, and has to sleep with a CPAP.  At signout, the plan was to discharge with an order for a lower extremity DVT study in the morning if the CT scan was negative.  I discussed this with the patient.  She is agreeable with this plan.  Additionally, some of the pain that she describes in her left lower extremity sounds to be more radicular in nature, she describes it as a burning and stinging sensation.  I did evaluate the skin, there is no evidence of shingles or other rash or infection.  She does have a history of sciatica, and this could be lumbar radiculopathy.  I have prescribed Ultram to see if this will help with the symptoms.  Primary care recommended for follow-up.     Montine Circle, PA-C 02/14/18 2328    Sherwood Gambler, MD 02/15/18 [phone]

## 2018-02-14 NOTE — ED Notes (Signed)
Pt returned from X Ray.

## 2018-02-14 NOTE — ED Provider Notes (Signed)
[facility] Provider Note   CSN: [id number] Arrival date & time: 02/14/18  1930     History   Chief Complaint Chief Complaint  Patient presents with  . Shortness of Breath    HPI Natasha [name] is a 67 y.o. female.  Patient with a history of thyroid CA, HTN, OSA presents with complaint of "pins and needles" sensation in her left LE. She was recently diagnosed with left DVT on 02/04/18, currently compliant with Xarelto regimen, and reports these are similar but worsening symptoms she had at the time of diagnosis. She states now the abnormal sensation is affecting her lateral thigh. No swelling or redness of the extremity. She is also concerned about worsening DOE. No chest pain, lightheadedness or dizziness. No fever or cough. She does feel that she has a postnasal drip but no there URI symptoms.   The history is provided by the patient. No language interpreter was used.    Past Medical History:  Diagnosis Date  . 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 Complication of anesthesia    trouble with airway  . Edema 12/22/2008  . HYPERTENSION 12/25/2007  . HYPOTHYROIDISM, POSTSURGICAL 05/04/2008  . LEG PAIN, LEFT 12/09/2008  . OSA (obstructive sleep apnea) 06/15/2013    Patient Active Problem List   Diagnosis Date Noted  . OSA (obstructive sleep apnea) 06/15/2013  . ALLERGIC RHINITIS 01/21/2009  . EDEMA 12/22/2008  . LEG PAIN, LEFT 12/09/2008  . DISTURBANCE OF SKIN SENSATION 12/05/2008  . NAUSEA AND VOMITING 05/23/2008  . CARCINOMA, THYROID GLAND, PAPILLARY 05/04/2008  . HYPOTHYROIDISM, POSTSURGICAL 05/04/2008  . HYPERTENSION 12/25/2007  . DYSPNEA 12/25/2007  . COUGH 12/25/2007    Past Surgical History:  Procedure Laterality Date  . ABDOMINAL HYSTERECTOMY    . ANKLE SURGERY    .  CERVICAL FUSION     x 2  . COLONOSCOPY WITH PROPOFOL N/A 08/08/2015   Procedure: COLONOSCOPY WITH PROPOFOL;  Surgeon: Juanita Craver, MD;  Location: [facility];  Service: Endoscopy;  Laterality: N/A;  . KNEE SURGERY    . TOTAL THYROIDECTOMY       OB History   None      Home Medications    Prior to Admission medications   Medication Sig Start Date End Date Taking? Authorizing Provider  benazepril-hydrochlorthiazide (LOTENSIN HCT) 20-25 MG tablet TAKE 1 TABLET BY MOUTH DAILY. Patient not taking: Reported on 02/03/2018 02/25/16   Shawnee Knapp, MD  Dextromethorphan-Guaifenesin [facility] DM MAXIMUM STRENGTH) 60-1200 MG TB12 Take 1 tablet by mouth every 12 (twelve) hours. 12/31/15   Shawnee Knapp, MD  diltiazem (CARDIZEM CD) 180 MG 24 hr capsule Take 180 mg by mouth daily.      [provider]  furosemide (LASIX) 20 MG tablet Start by taking 1 tablet every other day. 02/04/18   Montine Circle, PA-C  ibuprofen (ADVIL,MOTRIN) 600 MG tablet Take 1 tablet (600 mg total) by mouth every 6 (six) hours as needed. 11/01/15   Everitt Amber, MD  ipratropium (ATROVENT) 0.03 % nasal spray Place 2 sprays into the nose 4 (four) times daily. 12/31/15   Shawnee Knapp, MD  Multiple Vitamin (MULTIVITAMIN WITH MINERALS) TABS tablet Take 1 tablet by mouth daily.    [provider]  MYRBETRIQ 25 MG TB24 tablet Take 25 mg by  mouth daily. 12/14/17   [provider]  Omega-3 Fatty Acids (FISH OIL PO) Take 1 capsule by mouth daily.    [provider]  OVER THE COUNTER MEDICATION Take 1 tablet by mouth daily. [employer] Cal-Mag-Vit.D    [provider]  Rivaroxaban 15 & 20 MG TBPK Take as directed on package: Start with one 15mg  tablet by mouth twice a day with food. On Day 22, switch to one 20mg  tablet once a day with food. 02/04/18   Montine Circle, PA-C  SYNTHROID 112 MCG tablet Take 112 mcg by mouth daily. 06/27/15   [provider]  valsartan-hydrochlorothiazide (DIOVAN-HCT)  160-12.5 MG tablet Take 1 tablet by mouth daily. 01/20/18   [provider]    Family History Family History  Problem Relation Age of Onset  . Heart disease Mother   . Kidney disease Mother     Social History Social History   Tobacco Use  . Smoking status: Never Smoker  . Smokeless tobacco: Never Used  Substance Use Topics  . Alcohol use: No  . Drug use: No     Allergies   Codeine   Review of Systems Review of Systems  Constitutional: Negative for chills and fever.  HENT: Negative.   Respiratory: Positive for shortness of breath.   Cardiovascular: Negative.   Gastrointestinal: Negative.   Musculoskeletal:       See HPI.  Skin: Negative.   Neurological:       See HPI.     Physical Exam Updated Vital Signs BP (!) 141/79   Pulse 60   Temp 98.2 F (36.8 C) (Oral)   Resp 15   SpO2 96%   Physical Exam  Constitutional: She is oriented to person, place, and time. She appears well-developed and well-nourished.  HENT:  Head: Normocephalic.  Neck: Normal range of motion. Neck supple.  Cardiovascular: Normal rate and regular rhythm.  No tachycardia.  Pulmonary/Chest: Effort normal and breath sounds normal. She has no wheezes. She has no rhonchi. She has no rales. She exhibits no tenderness.  Speaking in full sentences with some evidence dyspnea. No distress.   Abdominal: Soft. Bowel sounds are normal. There is no tenderness. There is no rebound and no guarding.  Musculoskeletal: Normal range of motion.  LE's are unremarkable in appearance. No swelling or redness. No reproducible tenderness. Distal pulses present. FROM.  Neurological: She is alert and oriented to person, place, and time.  Skin: Skin is warm and dry. No rash noted. No erythema.  Psychiatric: She has a normal mood and affect.     ED Treatments / Results  Labs (all labs ordered are listed, but only abnormal results are displayed) Labs Reviewed  BASIC METABOLIC PANEL - Abnormal; Notable  for the following components:      Result Value   Glucose, Bld 107 (*)    All other components within normal limits  CBC  I-STAT TROPONIN, ED    EKG EKG Interpretation  Date/Time:  Saturday February 14 2018 19:35:29 EDT Ventricular Rate:  68 PR Interval:  198 QRS Duration: 102 QT Interval:  396 QTC Calculation: 421 R Axis:   -20 Text Interpretation:  Normal sinus rhythm no significant change compared to February 03 2018 Confirmed by Sherwood Gambler [phone]) on 02/14/2018 8:01:11 PM   Radiology Dg Chest 2 View  Result Date: 02/14/2018 CLINICAL DATA:  Dyspnea EXAM: CHEST - 2 VIEW COMPARISON:  Chest CT 01/31/2018 and CXR 12/31/2015 FINDINGS: The heart size and mediastinal contours are within  normal limits. Nodular density in the right middle lobe is redemonstrated measuring approximately 16 mm in diameter. Discrepancy in size likely related to the AP projection as this measured 9 x 8 mm on prior recent CT. Mild thoracic spondylosis is noted. IMPRESSION: No active cardiopulmonary disease. Redemonstration of right middle lobe nodule. Electronically Signed   By: Ashley Royalty M.D.   On: 02/14/2018 20:13    Procedures Procedures (including critical care time)  Medications Ordered in ED Medications  sodium chloride 0.9 % bolus 1,000 mL (1,000 mLs Intravenous New Bag/Given 02/14/18 2129)  iopamidol (ISOVUE-370) 76 % injection (has no administration in time range)  iopamidol (ISOVUE-370) 76 % injection 100 mL (100 mLs Intravenous Contrast Given 02/14/18 2157)     Initial Impression / Assessment and Plan / ED Course  I have reviewed the triage vital signs and the nursing notes.  Pertinent labs & imaging results that were available during my care of the patient were reviewed by me and considered in my medical decision making (see chart for details).     Patient with recent diagnosis DVT in left LE returns with concern for worsening clot in left leg based on radiation of the symptoms she had at the  time of diagnosis into the upper leg laterally. She is also have worsening SOB and DOE. No fever or URI symptoms.   She is seen by Dr. Regenia Skeeter who feels that a repeat CTA r/o PE is reasonable. The patient is given reassurance that her LE symptoms are not consistent with worsening DVT but an A.M. Doppler will be offered if she is discharged home tonight.   Patient care signed out to Lorre Munroe, PA-C, pending CTA results and recheck.   Final Clinical Impressions(s) / ED Diagnoses   Final diagnoses:  None   1. DOE 2. LE paresthesia  ED Discharge Orders    None       Charlann Lange, Hershal Coria 02/14/18 2223    Sherwood Gambler, MD 02/15/18 [phone]

## 2018-02-14 NOTE — ED Triage Notes (Signed)
Pt states she was treated recently for DVT and placed on xarelto.  In the past 2-3 days began having "pins and needles" and pain in her left hip and thigh and numbness in hands and feet.  Pt endorses shortness of breath.

## 2018-02-15 ENCOUNTER — Ambulatory Visit (HOSPITAL_COMMUNITY)
Admission: RE | Admit: 2018-02-15 | Discharge: 2018-02-15 | Disposition: A | Discharge status: Home / Self Care | Payer: Medicare Other | Source: Ambulatory Visit | Attending: Emergency Medicine | Admitting: Emergency Medicine

## 2018-02-15 DIAGNOSIS — Z86718 Personal history of other venous thrombosis and embolism: Secondary | ICD-10-CM | POA: Insufficient documentation

## 2018-02-15 NOTE — Progress Notes (Signed)
VASCULAR LAB PRELIMINARY  PRELIMINARY  PRELIMINARY  PRELIMINARY  Left lower extremity venous duplex completed.    Preliminary report:  There is no DVT or SVT noted in the left lower extremity. DVT found in the popliteal vein 02/03/18 appears to be resolved.  Cisco Kindt, RVT 02/15/2018, 8:56 AM

## 2018-02-27 ENCOUNTER — Ambulatory Visit: Payer: Medicare Other | Admitting: Vascular Surgery

## 2018-02-27 ENCOUNTER — Encounter: Payer: Self-pay | Admitting: Vascular Surgery

## 2018-02-27 ENCOUNTER — Encounter

## 2018-02-27 VITALS — BP 142/99 | HR 58 | Resp 18 | Ht 67.0 in | Wt 297.0 lb

## 2018-02-27 DIAGNOSIS — I739 Peripheral vascular disease, unspecified: Secondary | ICD-10-CM

## 2018-02-27 NOTE — Progress Notes (Signed)
Patient name: Natasha [name] MRN: [id number] DOB: November 14, 1950 Sex: female   REASON FOR CONSULT:    Leg pain.  The consult is requested by Dr. Lucianne Lei.  HPI:   Natasha [name] is a pleasant 67 y.o. female, who was referred with left leg pain.  The patient had a venous duplex scan which suggested most likely chronic clot in the left popliteal vein with some recanalization versus acute nonocclusive thrombus.  Patient was started on Xarelto.  She has been on this for 22 days.  The patient did a lot of traveling back in 2017 including many trips overseas.  It was around this time she began noticing some pain behind her left knee and also her medial left calf.  This went on for months.  Given that she had persistent pain she had a duplex scan in June which showed what was most likely chronic clot in the left popliteal vein with recanalization.  The patient is unaware of any previous history of DVT or phlebitis.  She does have a family history of vein issues.  Multiple aunts had vein issues and her paternal grandfather.  Also.  She was experiencing pain behind the left knee in the medial left calf.  The symptoms were aggravated by sitting and standing.  Her symptoms were alleviated with elevation.  She has been wearing knee-high compression stockings and currently is not having any pain.  She also noted some associated swelling when she was having pain.  Past Medical History:  Diagnosis Date  . 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 Complication of anesthesia    trouble with airway  . Edema 12/22/2008  . HYPERTENSION 12/25/2007  . HYPOTHYROIDISM, POSTSURGICAL 05/04/2008  . LEG PAIN, LEFT 12/09/2008  . OSA (obstructive sleep apnea) 06/15/2013    Family History  Problem Relation Age of Onset  . Heart disease Mother   . Kidney disease  Mother     SOCIAL HISTORY: Social History   Socioeconomic History  . Marital status: Widowed    Spouse name: Not on file  . Number of children: Not on file  . Years of education: Not on file  . Highest education level: Not on file  Occupational History  . Occupation: Pharmacist, hospital  Social Needs  . Financial resource strain: Not on file  . Food insecurity:    Worry: Not on file    Inability: Not on file  . Transportation needs:    Medical: Not on file    Non-medical: Not on file  Tobacco Use  . Smoking status: Never Smoker  . Smokeless tobacco: Never Used  Substance and Sexual Activity  . Alcohol use: No  . Drug use: No  . Sexual activity: Not on file  Lifestyle  . Physical activity:    Days per week: Not on file    Minutes per session: Not on file  . Stress: Not on file  Relationships  . Social connections:    Talks on phone: Not on file    Gets together: Not on file    Attends religious service: Not on file    Active member of club or organization: Not on file    Attends meetings of clubs or organizations: Not on file    Relationship status: Not on file  . Intimate partner violence:  Fear of current or ex partner: Not on file    Emotionally abused: Not on file    Physically abused: Not on file    Forced sexual activity: Not on file  Other Topics Concern  . Not on file  Social History Narrative   Pt has children    Allergies  Allergen Reactions  . Codeine Shortness Of Breath and Palpitations    Current Outpatient Medications  Medication Sig Dispense Refill  . furosemide (LASIX) 20 MG tablet Start by taking 1 tablet every other day. 7 tablet 0  . ibuprofen (ADVIL,MOTRIN) 600 MG tablet Take 1 tablet (600 mg total) by mouth every 6 (six) hours as needed. 30 tablet 0  . Multiple Vitamin (MULTIVITAMIN WITH MINERALS) TABS tablet Take 1 tablet by mouth daily.    Marland Kitchen MYRBETRIQ 25 MG TB24 tablet Take 25 mg by mouth daily.  4  . Rivaroxaban 15 & 20 MG TBPK Take as  directed on package: Start with one 15mg  tablet by mouth twice a day with food. On Day 22, switch to one 20mg  tablet once a day with food. 51 each 0  . SYNTHROID 112 MCG tablet Take 112 mcg by mouth daily.  3  . traMADol (ULTRAM) 50 MG tablet Take 1 tablet (50 mg total) by mouth every 6 (six) hours as needed. 10 tablet 0  . valsartan-hydrochlorothiazide (DIOVAN-HCT) 160-12.5 MG tablet Take 1 tablet by mouth daily.  0  . benazepril-hydrochlorthiazide (LOTENSIN HCT) 20-25 MG tablet TAKE 1 TABLET BY MOUTH DAILY. (Patient not taking: Reported on 02/03/2018) 30 tablet 0  . Dextromethorphan-Guaifenesin (MUCINEX DM MAXIMUM STRENGTH) 60-1200 MG TB12 Take 1 tablet by mouth every 12 (twelve) hours. (Patient not taking: Reported on 02/14/2018) 14 each 0  . ipratropium (ATROVENT) 0.03 % nasal spray Place 2 sprays into the nose 4 (four) times daily. (Patient not taking: Reported on 02/14/2018) 30 mL 0   No current facility-administered medications for this visit.     REVIEW OF SYSTEMS:  [X]  denotes positive finding, [ ]  denotes negative finding Cardiac  Comments:  Chest pain or chest pressure:    Shortness of breath upon exertion:    Short of breath when lying flat: x   Irregular heart rhythm:        Vascular    Pain in calf, thigh, or hip brought on by ambulation: x   Pain in feet at night that wakes you up from your sleep:     Blood clot in your veins: x   Leg swelling:  x       Pulmonary    Oxygen at home:    Productive cough:     Wheezing:         Neurologic    Sudden weakness in arms or legs:     Sudden numbness in arms or legs:     Sudden onset of difficulty speaking or slurred speech:    Temporary loss of vision in one eye:     Problems with dizziness:         Gastrointestinal    Blood in stool:     Vomited blood:         Genitourinary    Burning when urinating:     Blood in urine:        Psychiatric    Major depression:         Hematologic    Bleeding problems:    Problems  with blood clotting too easily:  Skin    Rashes or ulcers:        Constitutional    Fever or chills:     PHYSICAL EXAM:   Vitals:   02/27/18 0838  BP: (!) 144/104  Pulse: (!) 58  Resp: 18  SpO2: 93%  Weight: 297 lb (134.7 kg)  Height: 5\' 7"  (1.702 m)    GENERAL: The patient is a well-nourished female, in no acute distress. The vital signs are documented above. CARDIAC: There is a regular rate and rhythm.  VASCULAR: I do not detect carotid bruits. She has palpable dorsalis pedis pulses bilaterally. She has some reticular veins in both legs. She has mild bilateral lower extremity swelling. PULMONARY: There is good air exchange bilaterally without wheezing or rales. ABDOMEN: Soft and non-tender with normal pitched bowel sounds.  MUSCULOSKELETAL: There are no major deformities or cyanosis. NEUROLOGIC: No focal weakness or paresthesias are detected. SKIN: There are no ulcers or rashes noted. PSYCHIATRIC: The patient has a normal affect.  DATA:    VENOUS DUPLEX: I reviewed the venous duplex scan that was done on 02/03/2018.  This showed DVT in the left popliteal vein.  It was difficult to determine if this was   Chronic with partial recanalization versus an acute nonocclusive DVT.  Given the appearance by duplex it was felt that it was more likely chronic.  There was no evidence of DVT in the right lower extremity.  MEDICAL ISSUES:   LEFT POPLITEAL DVT: I think the most likely based on her history and duplex findings that the DVT in the left popliteal vein is chronic.  However, it is certainly possible that she had some acute clot on top of this and I think it would be perfectly reasonable, and probably the safest option to continue her Xarelto for a total of 3 months.  That she needs a little over 2 more months of therapy.  After that I think it would be safe to discontinue this.  We have discussed the importance of daily leg elevation and the proper positioning for this.  I  have encouraged her to wear her compression stockings when she is on her feet a lot.  We have discussed the importance of trying to avoid prolonged sitting and standing.  I have encouraged her to exercise.  We also discussed the importance of healthy weight management given that obesity can increase venous pressure.  I will be happy to see her back at any time if any new vascular issues arise.  Deitra Mayo [facility] [phone]

## 2018-04-22 ENCOUNTER — Ambulatory Visit
Admission: RE | Admit: 2018-04-22 | Discharge: 2018-04-22 | Disposition: A | Discharge status: Home / Self Care | Payer: Medicare Other | Source: Ambulatory Visit | Attending: Family Medicine | Admitting: Family Medicine

## 2018-04-22 ENCOUNTER — Other Ambulatory Visit: Payer: Self-pay | Admitting: Family Medicine

## 2018-04-22 DIAGNOSIS — R296 Repeated falls: Secondary | ICD-10-CM

## 2018-05-14 ENCOUNTER — Other Ambulatory Visit: Payer: Self-pay | Admitting: Orthopaedic Surgery

## 2018-05-14 DIAGNOSIS — M545 Low back pain: Secondary | ICD-10-CM

## 2018-05-14 DIAGNOSIS — M542 Cervicalgia: Secondary | ICD-10-CM

## 2018-05-28 ENCOUNTER — Ambulatory Visit
Admission: RE | Admit: 2018-05-28 | Discharge: 2018-05-28 | Disposition: A | Discharge status: Home / Self Care | Payer: Medicare Other | Source: Ambulatory Visit | Attending: Orthopaedic Surgery | Admitting: Orthopaedic Surgery

## 2018-05-28 DIAGNOSIS — M545 Low back pain: Secondary | ICD-10-CM

## 2018-05-28 DIAGNOSIS — M542 Cervicalgia: Secondary | ICD-10-CM

## 2018-07-13 ENCOUNTER — Other Ambulatory Visit (HOSPITAL_COMMUNITY): Payer: Self-pay | Admitting: Obstetrics and Gynecology

## 2018-07-13 DIAGNOSIS — R52 Pain, unspecified: Secondary | ICD-10-CM

## 2018-07-14 ENCOUNTER — Ambulatory Visit (HOSPITAL_COMMUNITY): Payer: Medicare Other

## 2018-07-15 ENCOUNTER — Ambulatory Visit (HOSPITAL_COMMUNITY)
Admission: RE | Admit: 2018-07-15 | Discharge: 2018-07-15 | Disposition: A | Discharge status: Home / Self Care | Payer: Medicare Other | Source: Ambulatory Visit | Attending: Obstetrics and Gynecology | Admitting: Obstetrics and Gynecology

## 2018-07-15 DIAGNOSIS — R52 Pain, unspecified: Secondary | ICD-10-CM | POA: Diagnosis not present

## 2018-07-15 NOTE — Progress Notes (Signed)
*  Preliminary Results* Bilateral lower extremity venous duplex completed. Bilateral lower extremities are negative for deep vein thrombosis. There is no evidence of Baker's cyst bilaterally.  07/15/2018 4:44 PM Deidre Carino Dawna Part

## 2018-07-28 ENCOUNTER — Other Ambulatory Visit: Payer: Self-pay

## 2018-07-28 NOTE — Patient Outreach (Signed)
[facility]) Care Management  07/28/2018  Natasha [name] May 22, 1951 [id number]   Medication Adherence call to Natasha [name] she ask if we can call back patient is due on Valsartan / HCTZ 160/12.5 mg patient is not feeling well at this time. Natasha [name] is showing past due under [place].   [facility] Management Direct Dial [phone]  Fax [phone] [email or web address]

## 2018-09-16 DIAGNOSIS — M2550 Pain in unspecified joint: Secondary | ICD-10-CM | POA: Diagnosis not present

## 2018-10-17 DIAGNOSIS — M2550 Pain in unspecified joint: Secondary | ICD-10-CM | POA: Diagnosis not present

## 2018-10-29 DIAGNOSIS — M542 Cervicalgia: Secondary | ICD-10-CM | POA: Diagnosis not present

## 2018-10-29 DIAGNOSIS — M5416 Radiculopathy, lumbar region: Secondary | ICD-10-CM | POA: Diagnosis not present

## 2018-10-29 DIAGNOSIS — M4322 Fusion of spine, cervical region: Secondary | ICD-10-CM | POA: Diagnosis not present

## 2018-10-29 DIAGNOSIS — Z6841 Body Mass Index (BMI) 40.0 and over, adult: Secondary | ICD-10-CM | POA: Diagnosis not present

## 2018-10-29 DIAGNOSIS — R1310 Dysphagia, unspecified: Secondary | ICD-10-CM | POA: Diagnosis not present

## 2018-10-31 DIAGNOSIS — H2513 Age-related nuclear cataract, bilateral: Secondary | ICD-10-CM | POA: Diagnosis not present

## 2018-10-31 DIAGNOSIS — H40033 Anatomical narrow angle, bilateral: Secondary | ICD-10-CM | POA: Diagnosis not present

## 2018-11-15 DIAGNOSIS — M2550 Pain in unspecified joint: Secondary | ICD-10-CM | POA: Diagnosis not present

## 2018-11-18 ENCOUNTER — Ambulatory Visit: Payer: Self-pay | Admitting: Physical Therapy

## 2018-12-16 DIAGNOSIS — M2550 Pain in unspecified joint: Secondary | ICD-10-CM | POA: Diagnosis not present

## 2018-12-17 DIAGNOSIS — R799 Abnormal finding of blood chemistry, unspecified: Secondary | ICD-10-CM | POA: Diagnosis not present

## 2018-12-17 DIAGNOSIS — R635 Abnormal weight gain: Secondary | ICD-10-CM | POA: Diagnosis not present

## 2018-12-17 DIAGNOSIS — E039 Hypothyroidism, unspecified: Secondary | ICD-10-CM | POA: Diagnosis not present

## 2018-12-17 DIAGNOSIS — R7309 Other abnormal glucose: Secondary | ICD-10-CM | POA: Diagnosis not present

## 2018-12-17 DIAGNOSIS — E782 Mixed hyperlipidemia: Secondary | ICD-10-CM | POA: Diagnosis not present

## 2018-12-17 DIAGNOSIS — I10 Essential (primary) hypertension: Secondary | ICD-10-CM | POA: Diagnosis not present

## 2018-12-17 DIAGNOSIS — E785 Hyperlipidemia, unspecified: Secondary | ICD-10-CM | POA: Diagnosis not present

## 2018-12-17 DIAGNOSIS — F330 Major depressive disorder, recurrent, mild: Secondary | ICD-10-CM | POA: Diagnosis not present

## 2019-01-15 DIAGNOSIS — M2550 Pain in unspecified joint: Secondary | ICD-10-CM | POA: Diagnosis not present

## 2019-02-15 DIAGNOSIS — M2550 Pain in unspecified joint: Secondary | ICD-10-CM | POA: Diagnosis not present

## 2019-03-17 ENCOUNTER — Other Ambulatory Visit: Payer: Self-pay | Admitting: Family Medicine

## 2019-03-17 DIAGNOSIS — E1169 Type 2 diabetes mellitus with other specified complication: Secondary | ICD-10-CM | POA: Diagnosis not present

## 2019-03-17 DIAGNOSIS — R109 Unspecified abdominal pain: Secondary | ICD-10-CM

## 2019-03-17 DIAGNOSIS — E031 Congenital hypothyroidism without goiter: Secondary | ICD-10-CM | POA: Diagnosis not present

## 2019-03-17 DIAGNOSIS — I10 Essential (primary) hypertension: Secondary | ICD-10-CM | POA: Diagnosis not present

## 2019-03-17 DIAGNOSIS — E039 Hypothyroidism, unspecified: Secondary | ICD-10-CM | POA: Diagnosis not present

## 2019-03-24 ENCOUNTER — Inpatient Hospital Stay: Admission: RE | Admit: 2019-03-24 | Payer: Self-pay | Source: Ambulatory Visit

## 2019-05-04 DIAGNOSIS — I10 Essential (primary) hypertension: Secondary | ICD-10-CM | POA: Diagnosis not present

## 2019-05-04 DIAGNOSIS — R635 Abnormal weight gain: Secondary | ICD-10-CM | POA: Diagnosis not present

## 2019-05-04 DIAGNOSIS — F330 Major depressive disorder, recurrent, mild: Secondary | ICD-10-CM | POA: Diagnosis not present

## 2019-05-06 DIAGNOSIS — M545 Low back pain: Secondary | ICD-10-CM | POA: Diagnosis not present

## 2019-05-06 DIAGNOSIS — Z803 Family history of malignant neoplasm of breast: Secondary | ICD-10-CM | POA: Diagnosis not present

## 2019-05-06 DIAGNOSIS — M25569 Pain in unspecified knee: Secondary | ICD-10-CM | POA: Diagnosis not present

## 2019-05-06 DIAGNOSIS — M170 Bilateral primary osteoarthritis of knee: Secondary | ICD-10-CM | POA: Diagnosis not present

## 2019-05-06 DIAGNOSIS — Z1231 Encounter for screening mammogram for malignant neoplasm of breast: Secondary | ICD-10-CM | POA: Diagnosis not present

## 2019-06-03 DIAGNOSIS — I10 Essential (primary) hypertension: Secondary | ICD-10-CM | POA: Diagnosis not present

## 2019-07-27 DIAGNOSIS — M25561 Pain in right knee: Secondary | ICD-10-CM | POA: Diagnosis not present

## 2019-07-27 DIAGNOSIS — E039 Hypothyroidism, unspecified: Secondary | ICD-10-CM | POA: Diagnosis not present

## 2019-07-27 DIAGNOSIS — N951 Menopausal and female climacteric states: Secondary | ICD-10-CM | POA: Diagnosis not present

## 2019-07-27 DIAGNOSIS — I10 Essential (primary) hypertension: Secondary | ICD-10-CM | POA: Diagnosis not present

## 2019-08-09 DIAGNOSIS — Z20828 Contact with and (suspected) exposure to other viral communicable diseases: Secondary | ICD-10-CM | POA: Diagnosis not present

## 2019-08-11 DIAGNOSIS — J09X2 Influenza due to identified novel influenza A virus with other respiratory manifestations: Secondary | ICD-10-CM | POA: Diagnosis not present

## 2019-08-18 ENCOUNTER — Encounter (HOSPITAL_COMMUNITY): Payer: Self-pay

## 2019-08-18 ENCOUNTER — Ambulatory Visit (INDEPENDENT_AMBULATORY_CARE_PROVIDER_SITE_OTHER): Payer: Medicare HMO

## 2019-08-18 ENCOUNTER — Ambulatory Visit (HOSPITAL_COMMUNITY)
Admission: EM | Admit: 2019-08-18 | Discharge: 2019-08-18 | Disposition: A | Discharge status: Home / Self Care | Payer: Medicare HMO | Attending: Family Medicine | Admitting: Family Medicine

## 2019-08-18 DIAGNOSIS — U071 COVID-19: Secondary | ICD-10-CM | POA: Diagnosis not present

## 2019-08-18 DIAGNOSIS — R300 Dysuria: Secondary | ICD-10-CM | POA: Diagnosis not present

## 2019-08-18 DIAGNOSIS — J399 Disease of upper respiratory tract, unspecified: Secondary | ICD-10-CM | POA: Diagnosis not present

## 2019-08-18 DIAGNOSIS — R0602 Shortness of breath: Secondary | ICD-10-CM | POA: Diagnosis not present

## 2019-08-18 DIAGNOSIS — N3942 Incontinence without sensory awareness: Secondary | ICD-10-CM | POA: Diagnosis not present

## 2019-08-18 DIAGNOSIS — R05 Cough: Secondary | ICD-10-CM | POA: Diagnosis not present

## 2019-08-18 DIAGNOSIS — R509 Fever, unspecified: Secondary | ICD-10-CM | POA: Diagnosis not present

## 2019-08-18 LAB — POCT URINALYSIS DIP (DEVICE)
Bilirubin Urine: NEGATIVE
Glucose, UA: NEGATIVE mg/dL
Ketones, ur: NEGATIVE mg/dL
Nitrite: NEGATIVE
Protein, ur: NEGATIVE mg/dL
Specific Gravity, Urine: 1.02 (ref 1.005–1.030)
Urobilinogen, UA: 0.2 mg/dL (ref 0.0–1.0)
pH: 7 (ref 5.0–8.0)

## 2019-08-18 LAB — POC SARS CORONAVIRUS 2 AG -  ED: SARS Coronavirus 2 Ag: POSITIVE — AB

## 2019-08-18 LAB — POC SARS CORONAVIRUS 2 AG: SARS Coronavirus 2 Ag: POSITIVE — AB

## 2019-08-18 MED ORDER — BENZONATATE 200 MG PO CAPS: 200.0000 mg | ORAL_CAPSULE | Freq: Three times a day (TID) | ORAL | 0 refills | Status: DC | PRN

## 2019-08-18 MED ORDER — DEXAMETHASONE SODIUM PHOSPHATE 10 MG/ML IJ SOLN
INTRAMUSCULAR | Status: AC
  Filled 2019-08-18: qty 1

## 2019-08-18 MED ORDER — ALBUTEROL SULFATE HFA 108 (90 BASE) MCG/ACT IN AERS: 1.0000 | INHALATION_SPRAY | Freq: Four times a day (QID) | RESPIRATORY_TRACT | 0 refills | Status: DC | PRN

## 2019-08-18 MED ORDER — DEXAMETHASONE SODIUM PHOSPHATE 10 MG/ML IJ SOLN: 10.0000 mg | Freq: Once | INTRAMUSCULAR | Status: DC

## 2019-08-18 MED ORDER — DOXYCYCLINE HYCLATE 100 MG PO CAPS: 100.0000 mg | ORAL_CAPSULE | Freq: Two times a day (BID) | ORAL | 0 refills | Status: DC

## 2019-08-18 NOTE — ED Triage Notes (Signed)
Pt presents to the UC with SOB started today; ear pain x 3 days ; chills, fever, body aches x 12 days. Pt states having burning sensation when urinating x 3 days.

## 2019-08-18 NOTE — Discharge Instructions (Addendum)
Urine did not show significant signs of UTI, I will send for a culture to confirm- this returns in 2 days  COVID positive- Xray without pneumonia We gave you a shot of Dexamethasone to help with breathing/lung inflammation Please use albuterol inhaler as needed Begin doxycycline twice daily for the next 10 days  If you develop increased shortness of breath, dizziness, lightheadedness, please follow-up in the emergency room

## 2019-08-19 DIAGNOSIS — U071 COVID-19: Secondary | ICD-10-CM

## 2019-08-19 HISTORY — DX: COVID-19: U07.1

## 2019-08-19 LAB — URINE CULTURE: Culture: <10000 — AB

## 2019-08-19 NOTE — ED Provider Notes (Signed)
[place]    CSN: [id number] Arrival date & time: 08/18/19  1558      History   Chief Complaint Chief Complaint  Patient presents with  . Shortness of Breath  . Fever  . Cough  . Otalgia  . Dysuria    HPI Natasha [name] is a 68 y.o. female history of hypertension, hypothyroidism, OSA, presenting today for evaluation of shortness of breath and dysuria.  Patient states that over the past 12 days she has had fevers chills and body aches.  In the past 24 hours she has developed increased shortness of breath.  She has had increased fatigue and shortness of breath with daily activities.  Denies chest pain.  Is also had some ear pain for the past 3 days.  She also has developed some dysuria for 3 days as well.  Denies burning or irritation at rest/without urination.  Denies any rashes or lesions.  She has had some urgency and some incontinence related to this.  She denies any known exposure to Covid.  Denies any known sick contacts.  Fevers have been up to 101.  This morning temperature was noted to be 99.5.  HPI  Past Medical History:  Diagnosis Date  . 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 Complication of anesthesia    trouble with airway  . Edema 12/22/2008  . HYPERTENSION 12/25/2007  . HYPOTHYROIDISM, POSTSURGICAL 05/04/2008  . LEG PAIN, LEFT 12/09/2008  . OSA (obstructive sleep apnea) 06/15/2013    Patient Active Problem List   Diagnosis Date Noted  . OSA (obstructive sleep apnea) 06/15/2013  . ALLERGIC RHINITIS 01/21/2009  . EDEMA 12/22/2008  . LEG PAIN, LEFT 12/09/2008  . DISTURBANCE OF SKIN SENSATION 12/05/2008  . NAUSEA AND VOMITING 05/23/2008  . CARCINOMA, THYROID GLAND, PAPILLARY 05/04/2008  . HYPOTHYROIDISM, POSTSURGICAL 05/04/2008  . HYPERTENSION 12/25/2007  . DYSPNEA 12/25/2007  . COUGH  12/25/2007    Past Surgical History:  Procedure Laterality Date  . ABDOMINAL HYSTERECTOMY    . ANKLE SURGERY    . CERVICAL FUSION     x 2  . COLONOSCOPY WITH PROPOFOL N/A 08/08/2015   Procedure: COLONOSCOPY WITH PROPOFOL;  Surgeon: Juanita Craver, MD;  Location: [facility];  Service: Endoscopy;  Laterality: N/A;  . KNEE SURGERY    . TOTAL THYROIDECTOMY      OB History   No obstetric history on file.      Home Medications    Prior to Admission medications   Medication Sig Start Date End Date Taking? Authorizing Provider  albuterol (VENTOLIN HFA) 108 (90 Base) MCG/ACT inhaler Inhale 1-2 puffs into the lungs every 6 (six) hours as needed for wheezing or shortness of breath. 08/18/19   Branda Chaudhary C, PA-C  benazepril-hydrochlorthiazide (LOTENSIN HCT) 20-25 MG tablet TAKE 1 TABLET BY MOUTH DAILY. Patient not taking: Reported on 02/03/2018 02/25/16   Shawnee Knapp, MD  benzonatate (TESSALON) 200 MG capsule Take 1 capsule (200 mg total) by mouth 3 (three) times daily as needed for up to 7 days for cough. 08/18/19 08/25/19  Cylee Dattilo C, PA-C  Dextromethorphan-Guaifenesin (MUCINEX DM MAXIMUM STRENGTH) 60-1200 MG TB12 Take 1 tablet by mouth every 12 (twelve) hours. Patient not taking: Reported on 02/14/2018 12/31/15   Shawnee Knapp, MD  doxycycline (VIBRAMYCIN) 100 MG capsule Take 1 capsule (  100 mg total) by mouth 2 (two) times daily for 10 days. 08/18/19 08/28/19  Hildegarde Dunaway C, PA-C  furosemide (LASIX) 20 MG tablet Start by taking 1 tablet every other day. 02/04/18   Montine Circle, PA-C  ibuprofen (ADVIL,MOTRIN) 600 MG tablet Take 1 tablet (600 mg total) by mouth every 6 (six) hours as needed. 11/01/15   Everitt Amber, MD  ipratropium (ATROVENT) 0.03 % nasal spray Place 2 sprays into the nose 4 (four) times daily. Patient not taking: Reported on 02/14/2018 12/31/15   Shawnee Knapp, MD  Multiple Vitamin (MULTIVITAMIN WITH MINERALS) TABS tablet Take 1 tablet by mouth daily.    [provider]  MYRBETRIQ 25 MG TB24 tablet Take 25 mg by mouth daily. 12/14/17   [provider]  Rivaroxaban 15 & 20 MG TBPK Take as directed on package: Start with one 15mg  tablet by mouth twice a day with food. On Day 22, switch to one 20mg  tablet once a day with food. 02/04/18   Montine Circle, PA-C  SYNTHROID 112 MCG tablet Take 112 mcg by mouth daily. 06/27/15   [provider]  traMADol (ULTRAM) 50 MG tablet Take 1 tablet (50 mg total) by mouth every 6 (six) hours as needed. 02/14/18   Montine Circle, PA-C  valsartan-hydrochlorothiazide (DIOVAN-HCT) 160-12.5 MG tablet Take 1 tablet by mouth daily. 01/20/18   [provider]    Family History Family History  Problem Relation Age of Onset  . Heart disease Mother   . Kidney disease Mother     Social History Social History   Tobacco Use  . Smoking status: Never Smoker  . Smokeless tobacco: Never Used  Substance Use Topics  . Alcohol use: No  . Drug use: No     Allergies   Codeine and Eggs or egg-derived products   Review of Systems Review of Systems  Constitutional: Positive for chills and fever. Negative for activity change, appetite change and fatigue.  HENT: Positive for rhinorrhea. Negative for congestion, ear pain, sinus pressure, sore throat and trouble swallowing.   Eyes: Negative for discharge and redness.  Respiratory: Positive for cough and shortness of breath. Negative for chest tightness.   Cardiovascular: Negative for chest pain.  Gastrointestinal: Negative for abdominal pain, diarrhea, nausea and vomiting.  Genitourinary: Positive for dysuria and urgency. Negative for decreased urine volume, difficulty urinating and genital sores.  Musculoskeletal: Negative for myalgias.  Skin: Negative for rash.  Neurological: Negative for dizziness, light-headedness and headaches.     Physical Exam Triage Vital Signs ED Triage Vitals  Enc Vitals Group     BP 08/18/19 1623 128/71      Pulse Rate 08/18/19 1623 77     Resp 08/18/19 1623 20     Temp 08/18/19 1623 99.1 F (37.3 C)     Temp Source 08/18/19 1623 Oral     SpO2 08/18/19 1623 94 %     Weight --      Height --      Head Circumference --      Peak Flow --      Pain Score 08/18/19 1620 0     Pain Loc --      Pain Edu? --      Excl. in [place]? --    No data found.  Updated Vital Signs BP 128/71 (BP Location: Right Arm)   Pulse 77   Temp 99.1 F (37.3 C) (Oral)   Resp 20   SpO2 94%  Oxygen rechecked and  was averaging 95/96% Visual Acuity Right Eye Distance:   Left Eye Distance:   Bilateral Distance:    Right Eye Near:   Left Eye Near:    Bilateral Near:     Physical Exam Vitals and nursing note reviewed.  Constitutional:      General: She is not in acute distress.    Appearance: She is well-developed.  HENT:     Head: Normocephalic and atraumatic.     Ears:     Comments: Bilateral ears without tenderness to palpation of external auricle, tragus and mastoid, EAC's without erythema or swelling, TM's with good bony landmarks and cone of light. Non erythematous.    Mouth/Throat:     Comments: Oral mucosa pink and moist, no tonsillar enlargement or exudate. Posterior pharynx patent and nonerythematous, no uvula deviation or swelling. Normal phonation. Eyes:     Conjunctiva/sclera: Conjunctivae normal.  Cardiovascular:     Rate and Rhythm: Normal rate and regular rhythm.     Heart sounds: No murmur.  Pulmonary:     Effort: Pulmonary effort is normal. No respiratory distress.     Breath sounds: Normal breath sounds.     Comments: Breathing comfortably at rest, CTABL, no wheezing, rales or other adventitious sounds auscultated Abdominal:     Palpations: Abdomen is soft.     Tenderness: There is no abdominal tenderness.  Musculoskeletal:     Cervical back: Neck supple.  Skin:    General: Skin is warm and dry.  Neurological:     Mental Status: She is alert.      UC Treatments / Results    Labs (all labs ordered are listed, but only abnormal results are displayed) Labs Reviewed  POCT URINALYSIS DIP (DEVICE) - Abnormal; Notable for the following components:      Result Value   Hgb urine dipstick TRACE (*)    Leukocytes,Ua TRACE (*)    All other components within normal limits  POC SARS CORONAVIRUS 2 AG -  ED - Abnormal; Notable for the following components:   SARS Coronavirus 2 Ag POSITIVE (*)    All other components within normal limits  POC SARS CORONAVIRUS 2 AG - Abnormal; Notable for the following components:   SARS Coronavirus 2 Ag POSITIVE (*)    All other components within normal limits  URINE CULTURE    EKG   Radiology DG Chest 2 View  Result Date: 08/18/2019 CLINICAL DATA:  Shortness of breath, wheezing, cough EXAM: CHEST - 2 VIEW COMPARISON:  02/14/2018 FINDINGS: Cardiomegaly. Tortuosity of the thoracic aorta. Increased markings in the lung bases. No effusions or overt edema. No acute bony abnormality. IMPRESSION: Cardiomegaly. Increased markings in the lung bases could reflect atelectasis. Electronically Signed   By: Rolm Baptise M.D.   On: 08/18/2019 17:51    Procedures Procedures (including critical care time)  Medications Ordered in UC Medications - No data to display  Initial Impression / Assessment and Plan / UC Course  I have reviewed the triage vital signs and the nursing notes.  Pertinent labs & imaging results that were available during my care of the patient were reviewed by me and considered in my medical decision making (see chart for details).     Trace leuks on UA, will send for culture, but will defer empiric treatment for UTI today.  Covid swab positive, x-ray does appear to have some haziness to bilateral bases, no pneumonia noted by radiologist, but given Covid positive and worsening shortness of breath, feel this is  concerning for early Covid pneumonia.  Providing Decadron IM prior to discharge, inhaler at home as well as  doxycycline given length of symptoms to cover atypicals.  O2 stable at this time, but discussed with patient my concern over symptoms to continue to decline, in this case patient needs to be seen and evaluated in the emergency room.  Advised to rest and drink plenty fluids in the meantime.  Discussed strict return precautions. Patient verbalized understanding and is agreeable with plan.  Final Clinical Impressions(s) / UC Diagnoses   Final diagnoses:  [id number] virus infection  Dysuria     Discharge Instructions     Urine did not show significant signs of UTI, I will send for a culture to confirm- this returns in 2 days  COVID positive- Xray without pneumonia We gave you a shot of Dexamethasone to help with breathing/lung inflammation Please use albuterol inhaler as needed Begin doxycycline twice daily for the next 10 days  If you develop increased shortness of breath, dizziness, lightheadedness, please follow-up in the emergency room    ED Prescriptions    Medication Sig Dispense Auth. Provider   albuterol (VENTOLIN HFA) 108 (90 Base) MCG/ACT inhaler Inhale 1-2 puffs into the lungs every 6 (six) hours as needed for wheezing or shortness of breath. 8 g Izaiha Lo C, PA-C   benzonatate (TESSALON) 200 MG capsule Take 1 capsule (200 mg total) by mouth 3 (three) times daily as needed for up to 7 days for cough. 28 capsule Mckena Chern C, PA-C   doxycycline (VIBRAMYCIN) 100 MG capsule Take 1 capsule (100 mg total) by mouth 2 (two) times daily for 10 days. 20 capsule Cloie Wooden, [place] C, PA-C     PDMP not reviewed this encounter.   Janith Lima, PA-C 08/19/19 0945

## 2019-08-21 ENCOUNTER — Inpatient Hospital Stay (HOSPITAL_COMMUNITY)
Admission: EM | Admit: 2019-08-21 | Discharge: 2019-09-06 | DRG: 177 | Disposition: A | Discharge status: Skilled Nursing Facility | Payer: Medicare HMO | Attending: Family Medicine | Admitting: Family Medicine

## 2019-08-21 ENCOUNTER — Emergency Department (HOSPITAL_COMMUNITY): Payer: Medicare HMO

## 2019-08-21 ENCOUNTER — Other Ambulatory Visit: Payer: Self-pay

## 2019-08-21 DIAGNOSIS — Z86718 Personal history of other venous thrombosis and embolism: Secondary | ICD-10-CM

## 2019-08-21 DIAGNOSIS — Z79899 Other long term (current) drug therapy: Secondary | ICD-10-CM

## 2019-08-21 DIAGNOSIS — U071 COVID-19: Secondary | ICD-10-CM | POA: Diagnosis not present

## 2019-08-21 DIAGNOSIS — I10 Essential (primary) hypertension: Secondary | ICD-10-CM | POA: Diagnosis present

## 2019-08-21 DIAGNOSIS — Z885 Allergy status to narcotic agent status: Secondary | ICD-10-CM

## 2019-08-21 DIAGNOSIS — R918 Other nonspecific abnormal finding of lung field: Secondary | ICD-10-CM | POA: Diagnosis not present

## 2019-08-21 DIAGNOSIS — G4733 Obstructive sleep apnea (adult) (pediatric): Secondary | ICD-10-CM | POA: Diagnosis present

## 2019-08-21 DIAGNOSIS — I2699 Other pulmonary embolism without acute cor pulmonale: Secondary | ICD-10-CM

## 2019-08-21 DIAGNOSIS — M6281 Muscle weakness (generalized): Secondary | ICD-10-CM | POA: Diagnosis not present

## 2019-08-21 DIAGNOSIS — Z9089 Acquired absence of other organs: Secondary | ICD-10-CM | POA: Diagnosis not present

## 2019-08-21 DIAGNOSIS — M47812 Spondylosis without myelopathy or radiculopathy, cervical region: Secondary | ICD-10-CM | POA: Diagnosis not present

## 2019-08-21 DIAGNOSIS — Z6841 Body Mass Index (BMI) 40.0 and over, adult: Secondary | ICD-10-CM

## 2019-08-21 DIAGNOSIS — M79662 Pain in left lower leg: Secondary | ICD-10-CM | POA: Diagnosis present

## 2019-08-21 DIAGNOSIS — Z90710 Acquired absence of both cervix and uterus: Secondary | ICD-10-CM | POA: Diagnosis not present

## 2019-08-21 DIAGNOSIS — R0602 Shortness of breath: Secondary | ICD-10-CM | POA: Diagnosis not present

## 2019-08-21 DIAGNOSIS — Z8249 Family history of ischemic heart disease and other diseases of the circulatory system: Secondary | ICD-10-CM

## 2019-08-21 DIAGNOSIS — Z85850 Personal history of malignant neoplasm of thyroid: Secondary | ICD-10-CM

## 2019-08-21 DIAGNOSIS — Z79890 Hormone replacement therapy (postmenopausal): Secondary | ICD-10-CM | POA: Diagnosis not present

## 2019-08-21 DIAGNOSIS — R001 Bradycardia, unspecified: Secondary | ICD-10-CM | POA: Diagnosis not present

## 2019-08-21 DIAGNOSIS — K219 Gastro-esophageal reflux disease without esophagitis: Secondary | ICD-10-CM | POA: Diagnosis not present

## 2019-08-21 DIAGNOSIS — E890 Postprocedural hypothyroidism: Secondary | ICD-10-CM | POA: Diagnosis present

## 2019-08-21 DIAGNOSIS — E039 Hypothyroidism, unspecified: Secondary | ICD-10-CM | POA: Diagnosis not present

## 2019-08-21 DIAGNOSIS — M2550 Pain in unspecified joint: Secondary | ICD-10-CM | POA: Diagnosis not present

## 2019-08-21 DIAGNOSIS — J1289 Other viral pneumonia: Secondary | ICD-10-CM

## 2019-08-21 DIAGNOSIS — R200 Anesthesia of skin: Secondary | ICD-10-CM | POA: Diagnosis not present

## 2019-08-21 DIAGNOSIS — J1282 Pneumonia due to coronavirus disease 2019: Secondary | ICD-10-CM | POA: Diagnosis present

## 2019-08-21 DIAGNOSIS — Z79891 Long term (current) use of opiate analgesic: Secondary | ICD-10-CM | POA: Diagnosis not present

## 2019-08-21 DIAGNOSIS — Z981 Arthrodesis status: Secondary | ICD-10-CM | POA: Diagnosis not present

## 2019-08-21 DIAGNOSIS — J9601 Acute respiratory failure with hypoxia: Secondary | ICD-10-CM | POA: Diagnosis not present

## 2019-08-21 DIAGNOSIS — R29818 Other symptoms and signs involving the nervous system: Secondary | ICD-10-CM | POA: Diagnosis not present

## 2019-08-21 DIAGNOSIS — Z7401 Bed confinement status: Secondary | ICD-10-CM | POA: Diagnosis not present

## 2019-08-21 DIAGNOSIS — R531 Weakness: Secondary | ICD-10-CM | POA: Diagnosis not present

## 2019-08-21 DIAGNOSIS — Z841 Family history of disorders of kidney and ureter: Secondary | ICD-10-CM | POA: Diagnosis not present

## 2019-08-21 DIAGNOSIS — I951 Orthostatic hypotension: Secondary | ICD-10-CM | POA: Diagnosis not present

## 2019-08-21 DIAGNOSIS — R5381 Other malaise: Secondary | ICD-10-CM | POA: Diagnosis not present

## 2019-08-21 DIAGNOSIS — R791 Abnormal coagulation profile: Secondary | ICD-10-CM | POA: Diagnosis not present

## 2019-08-21 DIAGNOSIS — G5621 Lesion of ulnar nerve, right upper limb: Secondary | ICD-10-CM | POA: Diagnosis not present

## 2019-08-21 HISTORY — DX: Other pulmonary embolism without acute cor pulmonale: I26.99

## 2019-08-21 LAB — CBC WITH DIFFERENTIAL/PLATELET
Abs Immature Granulocytes: 0.07 10*3/uL (ref 0.00–0.07)
Basophils Absolute: 0 10*3/uL (ref 0.0–0.1)
Basophils Relative: 1 %
Eosinophils Absolute: 0.1 10*3/uL (ref 0.0–0.5)
Eosinophils Relative: 1 %
HCT: 39.4 % (ref 36.0–46.0)
Hemoglobin: 12.4 g/dL (ref 12.0–15.0)
Immature Granulocytes: 1 %
Lymphocytes Relative: 25 %
Lymphs Abs: 1.9 10*3/uL (ref 0.7–4.0)
MCH: 28.4 pg (ref 26.0–34.0)
MCHC: 31.5 g/dL (ref 30.0–36.0)
MCV: 90.4 fL (ref 80.0–100.0)
Monocytes Absolute: 0.9 10*3/uL (ref 0.1–1.0)
Monocytes Relative: 13 %
Neutro Abs: 4.4 10*3/uL (ref 1.7–7.7)
Neutrophils Relative %: 59 %
Platelets: 223 10*3/uL (ref 150–400)
RBC: 4.36 MIL/uL (ref 3.87–5.11)
RDW: 14.6 % (ref 11.5–15.5)
WBC: 7.3 10*3/uL (ref 4.0–10.5)
nRBC: 0 % (ref 0.0–0.2)

## 2019-08-21 LAB — COMPREHENSIVE METABOLIC PANEL
ALT: 19 U/L (ref 0–44)
AST: 32 U/L (ref 15–41)
Albumin: 3.3 g/dL — ABNORMAL LOW (ref 3.5–5.0)
Alkaline Phosphatase: 86 U/L (ref 38–126)
Anion gap: 11 (ref 5–15)
BUN: 15 mg/dL (ref 8–23)
CO2: 20 mmol/L — ABNORMAL LOW (ref 22–32)
Calcium: 8.6 mg/dL — ABNORMAL LOW (ref 8.9–10.3)
Chloride: 109 mmol/L (ref 98–111)
Creatinine, Ser: 0.77 mg/dL (ref 0.44–1.00)
GFR calc Af Amer: >60 mL/min (ref >60)
GFR calc non Af Amer: >60 mL/min (ref >60)
Glucose, Bld: 98 mg/dL (ref 70–99)
Potassium: 4.1 mmol/L (ref 3.5–5.1)
Sodium: 140 mmol/L (ref 135–145)
Total Bilirubin: 1 mg/dL (ref 0.3–1.2)
Total Protein: 6.5 g/dL (ref 6.5–8.1)

## 2019-08-21 LAB — FIBRINOGEN: Fibrinogen: 388 mg/dL (ref 210–475)

## 2019-08-21 LAB — LACTATE DEHYDROGENASE: LDH: 373 U/L — ABNORMAL HIGH (ref 98–192)

## 2019-08-21 LAB — D-DIMER, QUANTITATIVE: D-Dimer, Quant: 12.54 ug/mL-FEU — ABNORMAL HIGH (ref 0.00–0.50)

## 2019-08-21 LAB — FERRITIN: Ferritin: 124 ng/mL (ref 11–307)

## 2019-08-21 LAB — LACTIC ACID, PLASMA
Lactic Acid, Venous: 1.4 mmol/L (ref 0.5–1.9)
Lactic Acid, Venous: 1.3 mmol/L (ref 0.5–1.9)

## 2019-08-21 LAB — PROCALCITONIN: Procalcitonin: <0.1 ng/mL

## 2019-08-21 LAB — TRIGLYCERIDES: Triglycerides: 231 mg/dL — ABNORMAL HIGH (ref <150)

## 2019-08-21 LAB — C-REACTIVE PROTEIN: CRP: 0.8 mg/dL (ref <1.0)

## 2019-08-21 MED ORDER — HEPARIN (PORCINE) 25000 UT/250ML-% IV SOLN
1400.0000 [IU]/h | INTRAVENOUS | Status: DC
  Administered 2019-08-21 – 2019-08-24 (×4): 1400 [IU]/h via INTRAVENOUS
  Filled 2019-08-21 (×6): qty 250

## 2019-08-21 MED ORDER — ALBUTEROL SULFATE HFA 108 (90 BASE) MCG/ACT IN AERS
4.0000 | INHALATION_SPRAY | RESPIRATORY_TRACT | Status: DC | PRN
  Filled 2019-08-21: qty 6.7

## 2019-08-21 MED ORDER — SODIUM CHLORIDE 0.9% FLUSH
3.0000 mL | Freq: Two times a day (BID) | INTRAVENOUS | Status: DC
  Administered 2019-08-21 – 2019-08-31 (×15): 3 mL via INTRAVENOUS

## 2019-08-21 MED ORDER — SODIUM CHLORIDE 0.9 % IV SOLN
200.0000 mg | Freq: Once | INTRAVENOUS | Status: AC
  Administered 2019-08-22: 200 mg via INTRAVENOUS
  Filled 2019-08-21: qty 40

## 2019-08-21 MED ORDER — DEXAMETHASONE SODIUM PHOSPHATE 10 MG/ML IJ SOLN
6.0000 mg | Freq: Every day | INTRAMUSCULAR | Status: DC
  Administered 2019-08-21 – 2019-08-25 (×5): 6 mg via INTRAVENOUS
  Filled 2019-08-21 (×5): qty 1

## 2019-08-21 MED ORDER — SODIUM CHLORIDE 0.9 % IV SOLN
100.0000 mg | Freq: Every day | INTRAVENOUS | Status: AC
  Administered 2019-08-22 – 2019-08-25 (×4): 100 mg via INTRAVENOUS
  Filled 2019-08-21 (×2): qty 20
  Filled 2019-08-21: qty 100
  Filled 2019-08-21: qty 20

## 2019-08-21 MED ORDER — ZOLPIDEM TARTRATE 5 MG PO TABS
5.0000 mg | ORAL_TABLET | Freq: Every evening | ORAL | Status: DC | PRN
  Administered 2019-09-05: 5 mg via ORAL
  Filled 2019-08-21: qty 1

## 2019-08-21 MED ORDER — HEPARIN BOLUS VIA INFUSION
6000.0000 [IU] | Freq: Once | INTRAVENOUS | Status: AC
  Administered 2019-08-21: 23:00:00 6000 [IU] via INTRAVENOUS
  Filled 2019-08-21: qty 6000

## 2019-08-21 MED ORDER — GUAIFENESIN-DM 100-10 MG/5ML PO SYRP
10.0000 mL | ORAL_SOLUTION | ORAL | Status: DC | PRN
  Administered 2019-08-30 – 2019-09-05 (×6): 10 mL via ORAL
  Filled 2019-08-21 (×8): qty 10

## 2019-08-21 MED ORDER — IOHEXOL 350 MG/ML SOLN
100.0000 mL | Freq: Once | INTRAVENOUS | Status: AC | PRN
  Administered 2019-08-21: 100 mL via INTRAVENOUS

## 2019-08-21 NOTE — ED Triage Notes (Signed)
Patient arrives via POV due to having worsening COVID symptoms. Per patient, she tested positive for COVID 4 days ago at urgent care and she's been monitoring her symptoms at home. Starting today she had worsening SOB, cough, and difficulty catching her breath. Patient has also has been monitoring her oxygen saturations were 89%-93% at home.

## 2019-08-21 NOTE — H&P (Signed)
History and Physical    Natasha [name] [id number] DOB: 1951/03/06 DOA: 08/21/2019  PCP: Lucianne Lei, MD   Patient coming from: Home   Chief Complaint: SOB, cough   HPI: Natasha [name] is a 68 y.o. female with medical history significant for hypertension, hypothyroidism, OSA, and history of left lower extremity DVT previously on Xarelto until 3-4 months ago per patient report, now presenting to the emergency department with fever, cough, and shortness of breath.  Patient reports that she developed fevers and malaise approximately 2 weeks ago, was also having some loose stools, and was seen in urgent care 3 days ago with new onset shortness of breath.  Since that time, she has also developed some central chest pain.  She was found to be positive for COVID-19 at the urgent care, was treated with Decadron IM and sent home with doxycycline.  She monitored her oxygen saturations at home, found them to be dipping into the 80s, shortness of breath was worsening, and so she comes into the ED.  She had been traveling a lot leading up to her prior DVT and clot was suspected secondary to immobilization from that.  She tolerated Xarelto well and denies any history of significant bleeding.  She reports no more fevers over the past couple days and the loose stools have slowed. She recently developed left calf pain similar to when she had a DVT there.   ED Course: Upon arrival to the ED, patient is found to be afebrile, saturating mid to upper 80s on room air while at rest, tachypneic, and with stable blood pressure.  EKG features a sinus rhythm.  CTA chest is concerning for acute pulmonary embolism with moderate clot burden primarily in the right lung and involving the distal main pulmonary artery with RV/LV elevation that is similar to a prior study before the PE, and also notable for patchy groundglass opacities bilaterally.  Chemistry panel and CBC are unremarkable, lactic acid is reassuringly normal,  procalcitonin is undetectable, and D-dimer was 12.54.  Patient was started on IV heparin infusion in the ED and hospitalist asked to admit.  Review of Systems:  All other systems reviewed and apart from HPI, are negative.  Past Medical History:  Diagnosis Date  . 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 Complication of anesthesia    trouble with airway  . Edema 12/22/2008  . HYPERTENSION 12/25/2007  . HYPOTHYROIDISM, POSTSURGICAL 05/04/2008  . LEG PAIN, LEFT 12/09/2008  . OSA (obstructive sleep apnea) 06/15/2013    Past Surgical History:  Procedure Laterality Date  . ABDOMINAL HYSTERECTOMY    . ANKLE SURGERY    . CERVICAL FUSION     x 2  . COLONOSCOPY WITH PROPOFOL N/A 08/08/2015   Procedure: COLONOSCOPY WITH PROPOFOL;  Surgeon: Juanita Craver, MD;  Location: [facility];  Service: Endoscopy;  Laterality: N/A;  . KNEE SURGERY    . TOTAL THYROIDECTOMY       reports that she has never smoked. She has never used smokeless tobacco. She reports that she does not drink alcohol or use drugs.  Allergies  Allergen Reactions  . Codeine Shortness Of Breath and Palpitations  . Eggs Or Egg-Derived Products Nausea And Vomiting    PROJECTILE VOMITING    Family History  Problem Relation Age of Onset  . Heart disease Mother   .  Kidney disease Mother      Prior to Admission medications   Medication Sig Start Date End Date Taking? Authorizing Provider  albuterol (VENTOLIN HFA) 108 (90 Base) MCG/ACT inhaler Inhale 1-2 puffs into the lungs every 6 (six) hours as needed for wheezing or shortness of breath. 08/18/19   Wieters, Hallie C, PA-C  benazepril-hydrochlorthiazide (LOTENSIN HCT) 20-25 MG tablet TAKE 1 TABLET BY MOUTH DAILY. Patient not taking: Reported on 02/03/2018 02/25/16   Shawnee Knapp, MD  benzonatate (TESSALON) 200 MG capsule Take  1 capsule (200 mg total) by mouth 3 (three) times daily as needed for up to 7 days for cough. 08/18/19 08/25/19  Wieters, Hallie C, PA-C  Dextromethorphan-Guaifenesin (MUCINEX DM MAXIMUM STRENGTH) 60-1200 MG TB12 Take 1 tablet by mouth every 12 (twelve) hours. Patient not taking: Reported on 02/14/2018 12/31/15   Shawnee Knapp, MD  doxycycline (VIBRAMYCIN) 100 MG capsule Take 1 capsule (100 mg total) by mouth 2 (two) times daily for 10 days. 08/18/19 08/28/19  Wieters, Hallie C, PA-C  furosemide (LASIX) 20 MG tablet Start by taking 1 tablet every other day. 02/04/18   Montine Circle, PA-C  ibuprofen (ADVIL,MOTRIN) 600 MG tablet Take 1 tablet (600 mg total) by mouth every 6 (six) hours as needed. 11/01/15   Everitt Amber, MD  ipratropium (ATROVENT) 0.03 % nasal spray Place 2 sprays into the nose 4 (four) times daily. Patient not taking: Reported on 02/14/2018 12/31/15   Shawnee Knapp, MD  Multiple Vitamin (MULTIVITAMIN WITH MINERALS) TABS tablet Take 1 tablet by mouth daily.    [provider]  MYRBETRIQ 25 MG TB24 tablet Take 25 mg by mouth daily. 12/14/17   [provider]  Rivaroxaban 15 & 20 MG TBPK Take as directed on package: Start with one 15mg  tablet by mouth twice a day with food. On Day 22, switch to one 20mg  tablet once a day with food. 02/04/18   Montine Circle, PA-C  SYNTHROID 112 MCG tablet Take 112 mcg by mouth daily. 06/27/15   [provider]  traMADol (ULTRAM) 50 MG tablet Take 1 tablet (50 mg total) by mouth every 6 (six) hours as needed. 02/14/18   Montine Circle, PA-C  valsartan-hydrochlorothiazide (DIOVAN-HCT) 160-12.5 MG tablet Take 1 tablet by mouth daily. 01/20/18   [provider]    Physical Exam: Vitals:   08/21/19 1815 08/21/19 1826 08/21/19 1827 08/21/19 2216  BP: (!) 145/89   (!) 154/104  Pulse: 66   60  Resp: (!) 30   15  Temp:      TempSrc:      SpO2: 93% 92%  93%  Weight:   136.1 kg   Height:   5\' 7"  (1.702 m)     Constitutional:  NAD, calm  Eyes: PERTLA, lids and conjunctivae normal ENMT: Mucous membranes are moist. Posterior pharynx clear of any exudate or lesions.   Neck: normal, supple, no masses, no thyromegaly Respiratory: no wheezing, no crackles. No accessory muscle use.  Cardiovascular: S1 & S2 heard, regular rate and rhythm. Left calf tender. Abdomen: No distension, soft, no rebound pain or guarding. Bowel sounds active.  Musculoskeletal: no clubbing / cyanosis. No joint deformity upper and lower extremities.   Skin: no significant rashes, lesions, ulcers. Warm, dry, well-perfused. Neurologic: No facial asymmetry. Sensation intact. Moving all extremities.  Psychiatric: Alert and oriented to person, place, and situation. Pleasant, cooperative.     Labs on Admission: I have personally reviewed following labs and imaging studies  CBC: Recent  Labs  Lab 08/21/19 1842  WBC 7.3  NEUTROABS 4.4  HGB 12.4  HCT 39.4  MCV 90.4  PLT [id number]   Basic Metabolic Panel: Recent Labs  Lab 08/21/19 1842  NA 140  K 4.1  CL 109  CO2 20*  GLUCOSE 98  BUN 15  CREATININE 0.77  CALCIUM 8.6*   GFR: Estimated Creatinine Clearance: 97.1 mL/min (by C-G formula based on SCr of 0.77 mg/dL). Liver Function Tests: Recent Labs  Lab 08/21/19 1842  AST 32  ALT 19  ALKPHOS 86  BILITOT 1.0  PROT 6.5  ALBUMIN 3.3*   No results for input(s): LIPASE, AMYLASE in the last 168 hours. No results for input(s): AMMONIA in the last 168 hours. Coagulation Profile: No results for input(s): INR, PROTIME in the last 168 hours. Cardiac Enzymes: No results for input(s): CKTOTAL, CKMB, CKMBINDEX, TROPONINI in the last 168 hours. BNP (last 3 results) No results for input(s): PROBNP in the last 8760 hours. HbA1C: No results for input(s): HGBA1C in the last 72 hours. CBG: No results for input(s): GLUCAP in the last 168 hours. Lipid Profile: Recent Labs    08/21/19 1842  TRIG 231*   Thyroid Function Tests: No results for  input(s): TSH, T4TOTAL, FREET4, T3FREE, THYROIDAB in the last 72 hours. Anemia Panel: Recent Labs    08/21/19 2002  FERRITIN 124   Urine analysis:    Component Value Date/Time   COLORURINE YELLOW 10/13/2015 2002   APPEARANCEUR CLEAR 10/13/2015 2002   LABSPEC 1.020 08/18/2019 1646   PHURINE 7.0 08/18/2019 1646   GLUCOSEU NEGATIVE 08/18/2019 1646   HGBUR TRACE (A) 08/18/2019 1646   BILIRUBINUR NEGATIVE 08/18/2019 1646   BILIRUBINUR negative 12/31/2015 1153   KETONESUR NEGATIVE 08/18/2019 1646   PROTEINUR NEGATIVE 08/18/2019 1646   UROBILINOGEN 0.2 08/18/2019 1646   NITRITE NEGATIVE 08/18/2019 1646   LEUKOCYTESUR TRACE (A) 08/18/2019 1646   Sepsis Labs: @LABRCNTIP (procalcitonin:4,lacticidven:4) ) Recent Results (from the past 240 hour(s))  Urine culture     Status: Abnormal   Collection Time: 08/18/19  4:53 PM   Specimen: Urine, Random  Result Value Ref Range Status   Specimen Description URINE, RANDOM  Final   Special Requests NONE  Final   Culture (A)  Final    <10,000 COLONIES/mL INSIGNIFICANT GROWTH Performed at [facility], [place] [address], [place], [place] [address]    Report Status 08/19/2019 FINAL  Final     Radiological Exams on Admission: CT Angio Chest PE W and/or Wo Contrast  Result Date: 08/21/2019 CLINICAL DATA:  PE suspected, low/intermediate prob, positive D-dimer covid+ COVID-19 positive 4 days ago, worsening shortness of breath, cough, and difficulty breathing. EXAM: CT ANGIOGRAPHY CHEST WITH CONTRAST TECHNIQUE: Multidetector CT imaging of the chest was performed using the standard protocol during bolus administration of intravenous contrast. Multiplanar CT image reconstructions and MIPs were obtained to evaluate the vascular anatomy. CONTRAST:  71mL OMNIPAQUE IOHEXOL 350 MG/ML SOLN COMPARISON:  Chest radiograph earlier this day. Chest CTA 02/15/2018, additional priors. FINDINGS: Cardiovascular: Positive for acute pulmonary embolus. There are filling  defects in the distal right main pulmonary artery extending into upper, middle, and lower lobar branches. Segmental involvement in the upper and lower lobes. Segmental pulmonary emboli in the left lower lobe. Thromboembolic burden is moderate. While the RV to LV ratio is 1.15, patient had a similar RV to LV ratio on prior imaging prior to pulmonary embolus on CT last year. No aortic dissection. Upper normal heart size. No pericardial effusion. Mediastinum/Nodes:  No enlarged mediastinal or hilar lymph thyroidectomy. No esophageal wall thickening. Lungs/Pleura: Breathing motion artifact partially obscures evaluation. There are patchy ground-glass opacities throughout both lungs, mild in degree. Perifissural nodule in the right lung anteriorly, series 7, image 48, has slightly decreased in size from prior exam currently 7 mm, previously 9 mm, this is consistent with benign etiology. No pleural fluid. Trachea and mainstem bronchi are grossly patent, not well assessed given expiratory phase. Upper Abdomen: No acute abnormality. Musculoskeletal: Multilevel degenerative change throughout the spine. There are no acute or suspicious osseous abnormalities. Review of the MIP images confirms the above findings. IMPRESSION: 1. Positive for acute pulmonary embolus with moderate clot burden, primarily in the right lung. Thrombus involves the distal main pulmonary artery and extends into the lobar and segmental branches. While the RV to LV ratio is elevated, similar RV to LV ratio is seen on CT 18 months ago prior to pulmonary embolus suggesting this is chronic. 2. Mild patchy ground-glass opacities in both lungs consistent with COVID-19, mild parenchymal involvement. 3. Right pulmonary nodule has diminished in size from CT 18 months ago, currently 7 mm, previously 9 mm, consistent with benign etiology. Critical Value/emergent results were called by telephone at the time of interpretation on 08/21/2019 at 10:17 pm to [place]  , who verbally acknowledged these results. Electronically Signed   By: Keith Rake M.D.   On: 08/21/2019 22:18   DG Chest Port 1 View  Result Date: 08/21/2019 CLINICAL DATA:  Shortness of breath. EXAM: PORTABLE CHEST 1 VIEW COMPARISON:  08/18/2019 FINDINGS: Mild bibasilar opacities are again noted and stable to minimally improved. The cardiomediastinal silhouette is unremarkable. No pleural effusion or pneumothorax. No new pulmonary opacities are present. IMPRESSION: Stable to slight improvement in bibasilar opacities. No other significant change. Electronically Signed   By: Margarette Canada M.D.   On: 08/21/2019 18:37    EKG: Independently reviewed. Sinus rhythm.   Assessment/Plan   1. Acute pulmonary embolism; acute hypoxic respiratory failure  - Presents with 3 days of SOB after 2 wks of fevers and malaise, tested positive for COVID-19 on 12/16, and while the fever and malaise has started to improve, the SOB is worsening and chest pain developed  - She is found to have d-dimer 12.5 and CTA chest with acute PE  - RV/LV is elevated but similar to a prior CTA before the PE  - She has left calf tenderness but no signs of phlegmasia  - She has been started on IV heparin in the ED  - Continue IV heparin, check troponin, BNP, and echocardiogram, continue supplemental O2 as needed   2. COVID-19 pneumonia  - Reports ~2 wks of fevers, but none since 12/17, and her other constitutional sxs had been improving, so acute SOB and hypoxia likely related to the PE and hopefully the viral pna is now resolving  - There is mild patchy ground-glass opacities bilaterally on CTA chest in ED, procalcitonin <0.10, CRP 0.8  - Started on Decadron and remdesivir in ED, will continue   - Continue isolation, supportive care, and trend inflammatory markers    3. Hypertension  - Continue valsartan-HCTZ    4. Hypothyroidism  - Continue Synthroid     DVT prophylaxis: IV heparin  Code Status: Full   Family  Communication: Multiple family members including daughter and son updated on video conference call from ED Consults called: None  Admission status: Inpatient    Vianne Bulls, MD [facility] Pager [phone]  If 7PM-7AM, please contact night-coverage [email or web address] Password [email or web address]  08/21/2019, 10:56 PM

## 2019-08-21 NOTE — ED Provider Notes (Signed)
[facility] EMERGENCY DEPARTMENT Provider Note   CSN: [id number] Arrival date & time: 08/21/19  1741     History Chief Complaint  Patient presents with  . COVID+  . Shortness of Breath    Natasha [name] is a 68 y.o. female.  The history is provided by the patient and medical records. No language interpreter was used.       68 year old female with history of hypertension, obstructive sleep apnea, thyroid cancer recently diagnosed with COVID-19 presenting complaint of shortness of breath.  Patient report for the past 15 days she has been feeling bad.  She endorsed fever, chills, body aches, generalized fatigue, nausea, occasional vomiting, decrease in appetite.  Fever as high as 101.  She was seen several times for her symptoms and most recently 3 days ago she test positive for COVID-19.  She was given steroid shot as well as antibiotic and breathing inhaler.  She mention her fever has since improved as well as body aches however today she noticed increased shortness of breath.  Her home O2 oximeter were noted to be in the 90s.  She decided to come to ER for further management.  She denies tobacco abuse.  She does not complain of any significant chest pain or abdominal pain.  No hemoptysis.    Past Medical History:  Diagnosis Date  . 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 Complication of anesthesia    trouble with airway  . Edema 12/22/2008  . HYPERTENSION 12/25/2007  . HYPOTHYROIDISM, POSTSURGICAL 05/04/2008  . LEG PAIN, LEFT 12/09/2008  . OSA (obstructive sleep apnea) 06/15/2013    Patient Active Problem List   Diagnosis Date Noted  . OSA (obstructive sleep apnea) 06/15/2013  . ALLERGIC RHINITIS 01/21/2009  . EDEMA 12/22/2008  . LEG PAIN, LEFT 12/09/2008  . DISTURBANCE OF SKIN SENSATION 12/05/2008  .  NAUSEA AND VOMITING 05/23/2008  . CARCINOMA, THYROID GLAND, PAPILLARY 05/04/2008  . HYPOTHYROIDISM, POSTSURGICAL 05/04/2008  . HYPERTENSION 12/25/2007  . DYSPNEA 12/25/2007  . COUGH 12/25/2007    Past Surgical History:  Procedure Laterality Date  . ABDOMINAL HYSTERECTOMY    . ANKLE SURGERY    . CERVICAL FUSION     x 2  . COLONOSCOPY WITH PROPOFOL N/A 08/08/2015   Procedure: COLONOSCOPY WITH PROPOFOL;  Surgeon: Juanita Craver, MD;  Location: [facility];  Service: Endoscopy;  Laterality: N/A;  . KNEE SURGERY    . TOTAL THYROIDECTOMY       OB History   No obstetric history on file.     Family History  Problem Relation Age of Onset  . Heart disease Mother   . Kidney disease Mother     Social History   Tobacco Use  . Smoking status: Never Smoker  . Smokeless tobacco: Never Used  Substance Use Topics  . Alcohol use: No  . Drug use: No    Home Medications Prior to Admission medications   Medication Sig Start Date End Date Taking? Authorizing Provider  albuterol (VENTOLIN HFA) 108 (90 Base) MCG/ACT inhaler Inhale 1-2 puffs into the lungs every 6 (six) hours as needed for wheezing or shortness of breath. 08/18/19   Wieters, Hallie C, PA-C  benazepril-hydrochlorthiazide (LOTENSIN HCT) 20-25 MG tablet TAKE 1 TABLET BY MOUTH DAILY. Patient not taking: Reported on 02/03/2018 02/25/16   Delman Cheadle  N, MD  benzonatate (TESSALON) 200 MG capsule Take 1 capsule (200 mg total) by mouth 3 (three) times daily as needed for up to 7 days for cough. 08/18/19 08/25/19  Wieters, Hallie C, PA-C  Dextromethorphan-Guaifenesin (MUCINEX DM MAXIMUM STRENGTH) 60-1200 MG TB12 Take 1 tablet by mouth every 12 (twelve) hours. Patient not taking: Reported on 02/14/2018 12/31/15   Shawnee Knapp, MD  doxycycline (VIBRAMYCIN) 100 MG capsule Take 1 capsule (100 mg total) by mouth 2 (two) times daily for 10 days. 08/18/19 08/28/19  Wieters, Hallie C, PA-C  furosemide (LASIX) 20 MG tablet Start by taking 1 tablet every  other day. 02/04/18   Montine Circle, PA-C  ibuprofen (ADVIL,MOTRIN) 600 MG tablet Take 1 tablet (600 mg total) by mouth every 6 (six) hours as needed. 11/01/15   Everitt Amber, MD  ipratropium (ATROVENT) 0.03 % nasal spray Place 2 sprays into the nose 4 (four) times daily. Patient not taking: Reported on 02/14/2018 12/31/15   Shawnee Knapp, MD  Multiple Vitamin (MULTIVITAMIN WITH MINERALS) TABS tablet Take 1 tablet by mouth daily.    [provider]  MYRBETRIQ 25 MG TB24 tablet Take 25 mg by mouth daily. 12/14/17   [provider]  Rivaroxaban 15 & 20 MG TBPK Take as directed on package: Start with one 15mg  tablet by mouth twice a day with food. On Day 22, switch to one 20mg  tablet once a day with food. 02/04/18   Montine Circle, PA-C  SYNTHROID 112 MCG tablet Take 112 mcg by mouth daily. 06/27/15   [provider]  traMADol (ULTRAM) 50 MG tablet Take 1 tablet (50 mg total) by mouth every 6 (six) hours as needed. 02/14/18   Montine Circle, PA-C  valsartan-hydrochlorothiazide (DIOVAN-HCT) 160-12.5 MG tablet Take 1 tablet by mouth daily. 01/20/18   [provider]    Allergies    Codeine and Eggs or egg-derived products  Review of Systems   Review of Systems  All other systems reviewed and are negative.   Physical Exam Updated Vital Signs BP (!) 153/103 (BP Location: Right Arm)   Pulse 72   Temp 98.7 F (37.1 C) (Oral)   Resp (!) 21   SpO2 95%   Physical Exam Vitals and nursing note reviewed.  Constitutional:      General: She is not in acute distress.    Appearance: She is well-developed. She is obese.  HENT:     Head: Atraumatic.  Eyes:     Conjunctiva/sclera: Conjunctivae normal.  Cardiovascular:     Rate and Rhythm: Tachycardia present.  Pulmonary:     Breath sounds: No wheezing, rhonchi or rales.     Comments: tachypneic Abdominal:     Palpations: Abdomen is soft.     Tenderness: There is no abdominal tenderness.  Musculoskeletal:         General: No swelling.     Cervical back: Neck supple.  Skin:    Findings: No rash.  Neurological:     Mental Status: She is alert and oriented to person, place, and time.  Psychiatric:        Mood and Affect: Mood normal.     ED Results / Procedures / Treatments   Labs (all labs ordered are listed, but only abnormal results are displayed) Labs Reviewed  COMPREHENSIVE METABOLIC PANEL - Abnormal; Notable for the following components:      Result Value   CO2 20 (*)    Calcium 8.6 (*)    Albumin 3.3 (*)  All other components within normal limits  D-DIMER, QUANTITATIVE (NOT AT [facility]) - Abnormal; Notable for the following components:   D-Dimer, Quant 12.54 (*)    All other components within normal limits  LACTATE DEHYDROGENASE - Abnormal; Notable for the following components:   LDH 373 (*)    All other components within normal limits  TRIGLYCERIDES - Abnormal; Notable for the following components:   Triglycerides 231 (*)    All other components within normal limits  CULTURE, BLOOD (ROUTINE X 2)  CULTURE, BLOOD (ROUTINE X 2)  LACTIC ACID, PLASMA  LACTIC ACID, PLASMA  CBC WITH DIFFERENTIAL/PLATELET  PROCALCITONIN  FIBRINOGEN  C-REACTIVE PROTEIN  FERRITIN  HEPARIN LEVEL (UNFRACTIONATED)    EKG EKG Interpretation  Date/Time:  Saturday August 21 2019 18:01:49 EST Ventricular Rate:  70 PR Interval:    QRS Duration: 103 QT Interval:  400 QTC Calculation: 432 R Axis:   -10 Text Interpretation: Sinus rhythm Confirmed by Lacretia Leigh ([id number]) on 08/21/2019 9:29:12 PM   Radiology CT Angio Chest PE W and/or Wo Contrast  Result Date: 08/21/2019 CLINICAL DATA:  PE suspected, low/intermediate prob, positive D-dimer covid+ COVID-19 positive 4 days ago, worsening shortness of breath, cough, and difficulty breathing. EXAM: CT ANGIOGRAPHY CHEST WITH CONTRAST TECHNIQUE: Multidetector CT imaging of the chest was performed using the standard protocol during bolus administration of  intravenous contrast. Multiplanar CT image reconstructions and MIPs were obtained to evaluate the vascular anatomy. CONTRAST:  7mL OMNIPAQUE IOHEXOL 350 MG/ML SOLN COMPARISON:  Chest radiograph earlier this day. Chest CTA 02/15/2018, additional priors. FINDINGS: Cardiovascular: Positive for acute pulmonary embolus. There are filling defects in the distal right main pulmonary artery extending into upper, middle, and lower lobar branches. Segmental involvement in the upper and lower lobes. Segmental pulmonary emboli in the left lower lobe. Thromboembolic burden is moderate. While the RV to LV ratio is 1.15, patient had a similar RV to LV ratio on prior imaging prior to pulmonary embolus on CT last year. No aortic dissection. Upper normal heart size. No pericardial effusion. Mediastinum/Nodes: No enlarged mediastinal or hilar lymph thyroidectomy. No esophageal wall thickening. Lungs/Pleura: Breathing motion artifact partially obscures evaluation. There are patchy ground-glass opacities throughout both lungs, mild in degree. Perifissural nodule in the right lung anteriorly, series 7, image 48, has slightly decreased in size from prior exam currently 7 mm, previously 9 mm, this is consistent with benign etiology. No pleural fluid. Trachea and mainstem bronchi are grossly patent, not well assessed given expiratory phase. Upper Abdomen: No acute abnormality. Musculoskeletal: Multilevel degenerative change throughout the spine. There are no acute or suspicious osseous abnormalities. Review of the MIP images confirms the above findings. IMPRESSION: 1. Positive for acute pulmonary embolus with moderate clot burden, primarily in the right lung. Thrombus involves the distal main pulmonary artery and extends into the lobar and segmental branches. While the RV to LV ratio is elevated, similar RV to LV ratio is seen on CT 18 months ago prior to pulmonary embolus suggesting this is chronic. 2. Mild patchy ground-glass opacities in  both lungs consistent with COVID-19, mild parenchymal involvement. 3. Right pulmonary nodule has diminished in size from CT 18 months ago, currently 7 mm, previously 9 mm, consistent with benign etiology. Critical Value/emergent results were called by telephone at the time of interpretation on 08/21/2019 at 10:17 pm to [place] , who verbally acknowledged these results. Electronically Signed   By: Keith Rake M.D.   On: 08/21/2019 22:18   DG Chest Port 1  View  Result Date: 08/21/2019 CLINICAL DATA:  Shortness of breath. EXAM: PORTABLE CHEST 1 VIEW COMPARISON:  08/18/2019 FINDINGS: Mild bibasilar opacities are again noted and stable to minimally improved. The cardiomediastinal silhouette is unremarkable. No pleural effusion or pneumothorax. No new pulmonary opacities are present. IMPRESSION: Stable to slight improvement in bibasilar opacities. No other significant change. Electronically Signed   By: Margarette Canada M.D.   On: 08/21/2019 18:37    Procedures .Critical Care Performed by: Domenic Moras, PA-C Authorized by: Domenic Moras, PA-C   Critical care provider statement:    Critical care time (minutes):  45   Critical care was time spent personally by me on the following activities:  Discussions with consultants, evaluation of patient's response to treatment, examination of patient, ordering and performing treatments and interventions, ordering and review of laboratory studies, ordering and review of radiographic studies, pulse oximetry, re-evaluation of patient's condition, obtaining history from patient or surrogate and review of old charts   (including critical care time)  Medications Ordered in ED Medications  albuterol (VENTOLIN HFA) 108 (90 Base) MCG/ACT inhaler 4 puff (has no administration in time range)  heparin bolus via infusion 6,000 Units (has no administration in time range)  heparin ADULT infusion 100 units/mL (25000 units/281mL sodium chloride 0.45%) (has no administration  in time range)  iohexol (OMNIPAQUE) 350 MG/ML injection 100 mL (100 mLs Intravenous Contrast Given 08/21/19 2145)    ED Course  I have reviewed the triage vital signs and the nursing notes.  Pertinent labs & imaging results that were available during my care of the patient were reviewed by me and considered in my medical decision making (see chart for details).    MDM Rules/Calculators/A&P                      BP (!) 154/104 (BP Location: Left Arm)   Pulse 60   Temp 98.7 F (37.1 C) (Oral)   Resp 15   Ht 5\' 7"  (1.702 m)   Wt 136.1 kg   SpO2 93%   BMI 46.99 kg/m   Final Clinical Impression(s) / ED Diagnoses Final diagnoses:  COVID-19 virus infection  Acute pulmonary embolism without acute cor pulmonale, unspecified pulmonary embolism type ([place])    Rx / DC Orders ED Discharge Orders    None     6:07 PM Patient with Covid symptoms for over 2 weeks here with worsening shortness of breath.  She sats at 88% on room air improved to 95% on 2 L of supplemental oxygen.  She feels she needs supplemental oxygen.  Anticipate hospitalization for further management of her worsening shortness of breath secondary to COVID-19 infection  Natasha [name] was evaluated in Emergency Department on 08/21/2019 for the symptoms described in the history of present illness. She was evaluated in the context of the global COVID-19 pandemic, which necessitated consideration that the patient might be at risk for infection with the SARS-CoV-2 virus that causes COVID-19. Institutional protocols and algorithms that pertain to the evaluation of patients at risk for COVID-19 are in a state of rapid change based on information released by regulatory bodies including the CDC and federal and state organizations. These policies and algorithms were followed during the patient's care in the ED.  10:49 PM Given new onset of shortness of breath, a chest CT angiogram was obtained to rule out PE.  CT angiogram shows no  evidence of acute PE with moderate clot burden primarily in the right lung.  Patient was heparinized.  Appreciate consultation from [facility] hospitalist, Dr. Oris Drone who agrees to see and admit patient.  Patient placed on supplemental oxygen as well.  Patient is aware of plan and agrees with plan.   Domenic Moras, PA-C 08/21/19 2253    Lacretia Leigh, MD 08/22/19 [phone]

## 2019-08-21 NOTE — ED Notes (Signed)
Pts O2 saturation was at 88%. Pt placed on 2L [place] and O2 saturation is now at 94-95%.

## 2019-08-21 NOTE — Progress Notes (Signed)
ANTICOAGULATION CONSULT NOTE - Initial Consult  Pharmacy Consult for Heparin  Indication: pulmonary embolus  Allergies  Allergen Reactions  . Codeine Shortness Of Breath and Palpitations  . Eggs Or Egg-Derived Products Nausea And Vomiting    PROJECTILE VOMITING    Patient Measurements: Height: 5\' 7"  (170.2 cm) Weight: 300 lb (136.1 kg) IBW/kg (Calculated) : 61.6 Heparin Dosing Weight: ~95 kg  Vital Signs: Temp: 98.7 F (37.1 C) (12/19 1759) Temp Source: Oral (12/19 1759) BP: 154/104 (12/19 2216) Pulse Rate: 60 (12/19 2216)  Labs: Recent Labs    08/21/19 1842  HGB 12.4  HCT 39.4  PLT 223  CREATININE 0.77    Estimated Creatinine Clearance: 97.1 mL/min (by C-G formula based on SCr of 0.77 mg/dL).   Medical History: Past Medical History:  Diagnosis Date  . 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 Complication of anesthesia    trouble with airway  . Edema 12/22/2008  . HYPERTENSION 12/25/2007  . HYPOTHYROIDISM, POSTSURGICAL 05/04/2008  . LEG PAIN, LEFT 12/09/2008  . OSA (obstructive sleep apnea) 06/15/2013   Assessment: 69 y/o COVID+ female here with worsening cough and shortness of breath. CT Angio is positive for acute PE. Pt completed Xarelto therapy a few months ago for DVT in the leg. CBC/renal function good.   Goal of Therapy:  Heparin level 0.3-0.7 units/ml Monitor platelets by anticoagulation protocol: Yes   Plan:  Heparin 6000 units BOLUS Start heparin drip at 1400 units/hr 0800 HL Daily CBC/HL Monitor for bleeding  Narda Bonds, PharmD, BCPS Clinical Pharmacist Phone: [phone]

## 2019-08-22 ENCOUNTER — Inpatient Hospital Stay (HOSPITAL_COMMUNITY): Payer: Medicare HMO

## 2019-08-22 DIAGNOSIS — J9601 Acute respiratory failure with hypoxia: Secondary | ICD-10-CM

## 2019-08-22 DIAGNOSIS — I2699 Other pulmonary embolism without acute cor pulmonale: Secondary | ICD-10-CM

## 2019-08-22 LAB — CBC WITH DIFFERENTIAL/PLATELET
Abs Immature Granulocytes: 0.15 10*3/uL — ABNORMAL HIGH (ref 0.00–0.07)
Basophils Absolute: 0 10*3/uL (ref 0.0–0.1)
Basophils Relative: 0 %
Eosinophils Absolute: 0 10*3/uL (ref 0.0–0.5)
Eosinophils Relative: 0 %
HCT: 39.6 % (ref 36.0–46.0)
Hemoglobin: 12.7 g/dL (ref 12.0–15.0)
Immature Granulocytes: 2 %
Lymphocytes Relative: 15 %
Lymphs Abs: 1 10*3/uL (ref 0.7–4.0)
MCH: 28.5 pg (ref 26.0–34.0)
MCHC: 32.1 g/dL (ref 30.0–36.0)
MCV: 88.8 fL (ref 80.0–100.0)
Monocytes Absolute: 0.4 10*3/uL (ref 0.1–1.0)
Monocytes Relative: 5 %
Neutro Abs: 5.1 10*3/uL (ref 1.7–7.7)
Neutrophils Relative %: 78 %
Platelets: 220 10*3/uL (ref 150–400)
RBC: 4.46 MIL/uL (ref 3.87–5.11)
RDW: 14.3 % (ref 11.5–15.5)
WBC: 6.7 10*3/uL (ref 4.0–10.5)
nRBC: 0 % (ref 0.0–0.2)

## 2019-08-22 LAB — COMPREHENSIVE METABOLIC PANEL
ALT: 17 U/L (ref 0–44)
AST: 20 U/L (ref 15–41)
Albumin: 3.2 g/dL — ABNORMAL LOW (ref 3.5–5.0)
Alkaline Phosphatase: 85 U/L (ref 38–126)
Anion gap: 8 (ref 5–15)
BUN: 11 mg/dL (ref 8–23)
CO2: 21 mmol/L — ABNORMAL LOW (ref 22–32)
Calcium: 8.7 mg/dL — ABNORMAL LOW (ref 8.9–10.3)
Chloride: 110 mmol/L (ref 98–111)
Creatinine, Ser: 0.75 mg/dL (ref 0.44–1.00)
GFR calc Af Amer: >60 mL/min (ref >60)
GFR calc non Af Amer: >60 mL/min (ref >60)
Glucose, Bld: 138 mg/dL — ABNORMAL HIGH (ref 70–99)
Potassium: 3.7 mmol/L (ref 3.5–5.1)
Sodium: 139 mmol/L (ref 135–145)
Total Bilirubin: 0.5 mg/dL (ref 0.3–1.2)
Total Protein: 7 g/dL (ref 6.5–8.1)

## 2019-08-22 LAB — TROPONIN I (HIGH SENSITIVITY): Troponin I (High Sensitivity): 4 ng/L (ref <18)

## 2019-08-22 LAB — ECHOCARDIOGRAM LIMITED
Height: 67 in
Weight: 4800 oz

## 2019-08-22 LAB — HEPARIN LEVEL (UNFRACTIONATED)
Heparin Unfractionated: 0.6 IU/mL (ref 0.30–0.70)
Heparin Unfractionated: 0.68 IU/mL (ref 0.30–0.70)

## 2019-08-22 LAB — D-DIMER, QUANTITATIVE: D-Dimer, Quant: 13.55 ug/mL-FEU — ABNORMAL HIGH (ref 0.00–0.50)

## 2019-08-22 LAB — HIV ANTIBODY (ROUTINE TESTING W REFLEX): HIV Screen 4th Generation wRfx: NONREACTIVE

## 2019-08-22 LAB — ABO/RH: ABO/RH(D): O POS

## 2019-08-22 LAB — FERRITIN: Ferritin: 152 ng/mL (ref 11–307)

## 2019-08-22 LAB — C-REACTIVE PROTEIN: CRP: 0.8 mg/dL (ref <1.0)

## 2019-08-22 LAB — MAGNESIUM: Magnesium: 1.9 mg/dL (ref 1.7–2.4)

## 2019-08-22 MED ORDER — ONDANSETRON HCL 4 MG PO TABS
4.0000 mg | ORAL_TABLET | Freq: Four times a day (QID) | ORAL | Status: DC | PRN
  Administered 2019-09-06: 4 mg via ORAL
  Filled 2019-08-22: qty 1

## 2019-08-22 MED ORDER — VALSARTAN-HYDROCHLOROTHIAZIDE 160-12.5 MG PO TABS: 1.0000 | ORAL_TABLET | Freq: Every day | ORAL | Status: DC

## 2019-08-22 MED ORDER — HYDROCHLOROTHIAZIDE 12.5 MG PO CAPS: 12.5000 mg | ORAL_CAPSULE | Freq: Every day | ORAL | Status: DC

## 2019-08-22 MED ORDER — ACETAMINOPHEN 325 MG PO TABS
650.0000 mg | ORAL_TABLET | Freq: Four times a day (QID) | ORAL | Status: DC | PRN
  Administered 2019-08-24 – 2019-09-03 (×3): 650 mg via ORAL
  Filled 2019-08-22 (×3): qty 2

## 2019-08-22 MED ORDER — SODIUM CHLORIDE 0.9 % IV SOLN: 250.0000 mL | INTRAVENOUS | Status: DC | PRN

## 2019-08-22 MED ORDER — IRBESARTAN 150 MG PO TABS
150.0000 mg | ORAL_TABLET | Freq: Every day | ORAL | Status: DC
  Administered 2019-08-22 – 2019-08-27 (×6): 150 mg via ORAL
  Filled 2019-08-22 (×7): qty 1

## 2019-08-22 MED ORDER — FENTANYL CITRATE (PF) 100 MCG/2ML IJ SOLN: 25.0000 ug | INTRAMUSCULAR | Status: DC | PRN

## 2019-08-22 MED ORDER — LEVOTHYROXINE SODIUM 112 MCG PO TABS
112.0000 ug | ORAL_TABLET | Freq: Every day | ORAL | Status: DC
  Administered 2019-08-22: 112 ug via ORAL
  Filled 2019-08-22: qty 1

## 2019-08-22 MED ORDER — SODIUM CHLORIDE 0.9% FLUSH
3.0000 mL | Freq: Two times a day (BID) | INTRAVENOUS | Status: DC
  Administered 2019-08-22 – 2019-09-06 (×26): 3 mL via INTRAVENOUS

## 2019-08-22 MED ORDER — ACETAMINOPHEN 650 MG RE SUPP
650.0000 mg | Freq: Four times a day (QID) | RECTAL | Status: DC | PRN
  Filled 2019-08-22: qty 1

## 2019-08-22 MED ORDER — LEVOTHYROXINE SODIUM 75 MCG PO TABS: 150.0000 ug | ORAL_TABLET | Freq: Every day | ORAL | Status: DC

## 2019-08-22 MED ORDER — ONDANSETRON HCL 4 MG/2ML IJ SOLN: 4.0000 mg | Freq: Four times a day (QID) | INTRAMUSCULAR | Status: DC | PRN

## 2019-08-22 MED ORDER — LEVOTHYROXINE SODIUM 75 MCG PO TABS
150.0000 ug | ORAL_TABLET | Freq: Every day | ORAL | Status: DC
  Administered 2019-08-23 – 2019-09-06 (×15): 150 ug via ORAL
  Filled 2019-08-22 (×15): qty 2

## 2019-08-22 MED ORDER — SENNOSIDES-DOCUSATE SODIUM 8.6-50 MG PO TABS: 1.0000 | ORAL_TABLET | Freq: Every evening | ORAL | Status: DC | PRN

## 2019-08-22 MED ORDER — SODIUM CHLORIDE 0.9% FLUSH: 3.0000 mL | INTRAVENOUS | Status: DC | PRN

## 2019-08-22 NOTE — ED Notes (Signed)
Hospital bed ordered for patient.

## 2019-08-22 NOTE — Progress Notes (Signed)
  Echocardiogram 2D Echocardiogram has been performed.  Johny Chess 08/22/2019, 10:25 AM

## 2019-08-22 NOTE — ED Notes (Signed)
Dinner Tray Ordered @ 1842. 

## 2019-08-22 NOTE — ED Notes (Signed)
Lunch Tray Ordered@ 1148. 

## 2019-08-22 NOTE — ED Notes (Signed)
Breakfast ordered 

## 2019-08-22 NOTE — Progress Notes (Signed)
PROGRESS NOTE    Natasha [name]  [id number] DOB: April 11, 1951 DOA: 08/21/2019 PCP: Lucianne Lei, MD   Brief Narrative: 68 year old with past medical history significant for hypertension, hypothyroidism, OSA, history of left lower extremity DVT treated with Xarelto completed treatment 3 to 4 months prior to admission who presents complaining of fever cough and shortness of breath.  Patient developed symptoms 2 weeks prior to admission.  Patient was evaluated at urgent care 3 days ago for shortness of breath, she was found to be positive for Covid treated with intramuscular Decadron and sent home on doxycycline.  Patient presents with worsening shortness of breath, oxygen saturation at home in the 80s.  Evaluation in the ED patient was found to be tachypneic, afebrile, oxygen saturation 80 on room air.  CT angio concerning with acute pulmonary embolism with moderate clot burden, groundglass opacity bilaterally.  D-dimer 12.  Patient was a started on IV heparin infusion, remdesivir and steroids.    Assessment & Plan:   Principal Problem:   Acute pulmonary embolism (HCC) Active Problems:   HYPOTHYROIDISM, POSTSURGICAL   Hypertension  1-Acute Pulmonary Embolism; acute hypoxic respiratory Failure; -Continue with Heparin Gtt for at least 48 hours.  -Follow ECHO.  -Blood pressure stable, oxygen saturation 91 on 2 L. -Check Doppler lower extremity  2-Covid 19 PNA;  Symptoms for 2 weeks.  Continue with Decadron and Remdesivir.  Follow inflammatory markers.  CRP not significantly elevated.  [place]    08/21/19 1842 08/21/19 2002 08/22/19 1054  DDIMER 12.54*  --  13.55*  FERRITIN  --  124 152  LDH 373*  --   --   CRP  --  0.8 0.8    No results found for: SARSCOV2NAA  3-HTN;  Continue with Valsartan.  Hold diuretics for now.   4-Hypothyroidism;  Continue with Synthroid.      Estimated body mass index is 46.99 kg/m as calculated from the following:  Height as of this encounter: 5\' 7"  (1.702 m).   Weight as of this encounter: 136.1 kg.   DVT prophylaxis: Heparin  Code Status: full code Family Communication: Care discussed with patient Disposition Plan: Remain in the hospital for treatment of covid PNA, PE. Needs atleast 48 hours of heparin Gtt prior transition to oral anticoagulation. Awaiting ECHO result. Consultants:   None  Procedures:   ECHO  Doppler   Antimicrobials/ Others:  Remdesivir  Subjective: Patient is alert and oriented, she is breathing okay, she reports intermittent or sporadic chest pain. Denies abdominal pain  Objective: Vitals:   08/22/19 0200 08/22/19 0400 08/22/19 0425 08/22/19 0515  BP: (!) 154/89 (!) 163/101    Pulse: 68 62 62 63  Resp: (!) 28 (!) 31 (!) 28 (!) 22  Temp:      TempSrc:      SpO2: 93% (!) 88% 91% 91%  Weight:      Height:       No intake or output data in the 24 hours ending 08/22/19 0728 Filed Weights   08/21/19 1827  Weight: 136.1 kg    Examination:  General exam: Appears calm and comfortable  Respiratory system: Clear to auscultation.  Mild tachypnea Cardiovascular system: S1 & S2 heard, RRR. No JVD, murmurs, rubs, gallops or clicks. No pedal edema. Gastrointestinal system: Abdomen is nondistended, soft and nontender. No organomegaly or masses felt. Normal bowel sounds heard. Central nervous system: Alert and oriented. No focal neurological deficits. Extremities: Symmetric 5 x 5 power. Skin: No rashes, lesions  or ulcers Psychiatry: Judgement and insight appear normal. Mood & affect appropriate.     Data Reviewed: I have personally reviewed following labs and imaging studies  CBC: Recent Labs  Lab 08/21/19 1842  WBC 7.3  NEUTROABS 4.4  HGB 12.4  HCT 39.4  MCV 90.4  PLT [id number]   Basic Metabolic Panel: Recent Labs  Lab 08/21/19 1842  NA 140  K 4.1  CL 109  CO2 20*  GLUCOSE 98  BUN 15  CREATININE 0.77  CALCIUM 8.6*   GFR: Estimated Creatinine  Clearance: 97.1 mL/min (by C-G formula based on SCr of 0.77 mg/dL). Liver Function Tests: Recent Labs  Lab 08/21/19 1842  AST 32  ALT 19  ALKPHOS 86  BILITOT 1.0  PROT 6.5  ALBUMIN 3.3*   No results for input(s): LIPASE, AMYLASE in the last 168 hours. No results for input(s): AMMONIA in the last 168 hours. Coagulation Profile: No results for input(s): INR, PROTIME in the last 168 hours. Cardiac Enzymes: No results for input(s): CKTOTAL, CKMB, CKMBINDEX, TROPONINI in the last 168 hours. BNP (last 3 results) No results for input(s): PROBNP in the last 8760 hours. HbA1C: No results for input(s): HGBA1C in the last 72 hours. CBG: No results for input(s): GLUCAP in the last 168 hours. Lipid Profile: Recent Labs    08/21/19 1842  TRIG 231*   Thyroid Function Tests: No results for input(s): TSH, T4TOTAL, FREET4, T3FREE, THYROIDAB in the last 72 hours. Anemia Panel: Recent Labs    08/21/19 2002  FERRITIN 124   Sepsis Labs: Recent Labs  Lab 08/21/19 1842 08/21/19 1936 08/21/19 2000  PROCALCITON <0.10  --   --   LATICACIDVEN  --  1.4 1.3    Recent Results (from the past 240 hour(s))  Urine culture     Status: Abnormal   Collection Time: 08/18/19  4:53 PM   Specimen: Urine, Random  Result Value Ref Range Status   Specimen Description URINE, RANDOM  Final   Special Requests NONE  Final   Culture (A)  Final    <10,000 COLONIES/mL INSIGNIFICANT GROWTH Performed at [facility], [place] [address], [place], [place] [address]    Report Status 08/19/2019 FINAL  Final         Radiology Studies: CT Angio Chest PE W and/or Wo Contrast  Result Date: 08/21/2019 CLINICAL DATA:  PE suspected, low/intermediate prob, positive D-dimer covid+ COVID-19 positive 4 days ago, worsening shortness of breath, cough, and difficulty breathing. EXAM: CT ANGIOGRAPHY CHEST WITH CONTRAST TECHNIQUE: Multidetector CT imaging of the chest was performed using the standard protocol during  bolus administration of intravenous contrast. Multiplanar CT image reconstructions and MIPs were obtained to evaluate the vascular anatomy. CONTRAST:  78mL OMNIPAQUE IOHEXOL 350 MG/ML SOLN COMPARISON:  Chest radiograph earlier this day. Chest CTA 02/15/2018, additional priors. FINDINGS: Cardiovascular: Positive for acute pulmonary embolus. There are filling defects in the distal right main pulmonary artery extending into upper, middle, and lower lobar branches. Segmental involvement in the upper and lower lobes. Segmental pulmonary emboli in the left lower lobe. Thromboembolic burden is moderate. While the RV to LV ratio is 1.15, patient had a similar RV to LV ratio on prior imaging prior to pulmonary embolus on CT last year. No aortic dissection. Upper normal heart size. No pericardial effusion. Mediastinum/Nodes: No enlarged mediastinal or hilar lymph thyroidectomy. No esophageal wall thickening. Lungs/Pleura: Breathing motion artifact partially obscures evaluation. There are patchy ground-glass opacities throughout both lungs, mild in degree. Perifissural nodule  in the right lung anteriorly, series 7, image 48, has slightly decreased in size from prior exam currently 7 mm, previously 9 mm, this is consistent with benign etiology. No pleural fluid. Trachea and mainstem bronchi are grossly patent, not well assessed given expiratory phase. Upper Abdomen: No acute abnormality. Musculoskeletal: Multilevel degenerative change throughout the spine. There are no acute or suspicious osseous abnormalities. Review of the MIP images confirms the above findings. IMPRESSION: 1. Positive for acute pulmonary embolus with moderate clot burden, primarily in the right lung. Thrombus involves the distal main pulmonary artery and extends into the lobar and segmental branches. While the RV to LV ratio is elevated, similar RV to LV ratio is seen on CT 18 months ago prior to pulmonary embolus suggesting this is chronic. 2. Mild patchy  ground-glass opacities in both lungs consistent with COVID-19, mild parenchymal involvement. 3. Right pulmonary nodule has diminished in size from CT 18 months ago, currently 7 mm, previously 9 mm, consistent with benign etiology. Critical Value/emergent results were called by telephone at the time of interpretation on 08/21/2019 at 10:17 pm to [place] , who verbally acknowledged these results. Electronically Signed   By: Keith Rake M.D.   On: 08/21/2019 22:18   DG Chest Port 1 View  Result Date: 08/21/2019 CLINICAL DATA:  Shortness of breath. EXAM: PORTABLE CHEST 1 VIEW COMPARISON:  08/18/2019 FINDINGS: Mild bibasilar opacities are again noted and stable to minimally improved. The cardiomediastinal silhouette is unremarkable. No pleural effusion or pneumothorax. No new pulmonary opacities are present. IMPRESSION: Stable to slight improvement in bibasilar opacities. No other significant change. Electronically Signed   By: Margarette Canada M.D.   On: 08/21/2019 18:37        Scheduled Meds: . dexamethasone (DECADRON) injection  6 mg Intravenous Daily  . irbesartan  150 mg Oral Daily  . levothyroxine  112 mcg Oral Daily  . sodium chloride flush  3 mL Intravenous Q12H  . sodium chloride flush  3 mL Intravenous Q12H   Continuous Infusions: . sodium chloride    . heparin 1,400 Units/hr (08/21/19 2326)  . remdesivir 100 mg in NS 100 mL       LOS: 1 day    Time spent: 35 minutes.     Elmarie Shiley, MD [facility]   If 7PM-7AM, please contact night-coverage [email or web address] Password [facility] 08/22/2019, 7:28 AM

## 2019-08-22 NOTE — Progress Notes (Signed)
[place] for Heparin  Indication: pulmonary embolus  Allergies  Allergen Reactions  . Codeine Shortness Of Breath and Palpitations  . Eggs Or Egg-Derived Products Nausea And Vomiting    PROJECTILE VOMITING    Patient Measurements: Height: 5\' 7"  (170.2 cm) Weight: 300 lb (136.1 kg) IBW/kg (Calculated) : 61.6 Heparin Dosing Weight: ~95 kg  Vital Signs: BP: 150/100 (12/20 1717) Pulse Rate: 61 (12/20 1717)  Labs: Recent Labs    08/21/19 1842 08/21/19 2327 08/22/19 1054 08/22/19 1715  HGB 12.4  --  12.7  --   HCT 39.4  --  39.6  --   PLT 223  --  220  --   HEPARINUNFRC  --   --  0.60 0.68  CREATININE 0.77  --  0.75  --   TROPONINIHS  --  4  --   --     Estimated Creatinine Clearance: 97.1 mL/min (by C-G formula based on SCr of 0.75 mg/dL).   Medical History: Past Medical History:  Diagnosis Date  . 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 Complication of anesthesia    trouble with airway  . Edema 12/22/2008  . HYPERTENSION 12/25/2007  . HYPOTHYROIDISM, POSTSURGICAL 05/04/2008  . LEG PAIN, LEFT 12/09/2008  . OSA (obstructive sleep apnea) 06/15/2013   Assessment: 68 y/o COVID+ female here with worsening cough and shortness of breath. CT Angio is positive for acute PE. Pt completed Xarelto therapy a few months ago for DVT in the leg. CBC/renal function WNL. Pharmacy consulted to dose heparin. Initial heparin level therapeutic. No active bleed or issues with the infusion reported.  Repeat heparin level remains at goal.  Goal of Therapy:  Heparin level 0.3-0.7 units/ml Monitor platelets by anticoagulation protocol: Yes   Plan:  Continue heparin drip at 1400 units/hr Daily heparin level and CBC   Arrie Senate, PharmD, BCPS Clinical Pharmacist [phone] Please check  AMION for all [place] numbers 08/22/2019

## 2019-08-22 NOTE — Progress Notes (Signed)
[place] for Heparin  Indication: pulmonary embolus  Allergies  Allergen Reactions  . Codeine Shortness Of Breath and Palpitations  . Eggs Or Egg-Derived Products Nausea And Vomiting    PROJECTILE VOMITING    Patient Measurements: Height: 5\' 7"  (170.2 cm) Weight: 300 lb (136.1 kg) IBW/kg (Calculated) : 61.6 Heparin Dosing Weight: ~95 kg  Vital Signs: BP: 163/101 (12/20 0400) Pulse Rate: 63 (12/20 0515)  Labs: Recent Labs    08/21/19 1842 08/21/19 2327 08/22/19 1054  HGB 12.4  --  12.7  HCT 39.4  --  39.6  PLT 223  --  220  HEPARINUNFRC  --   --  0.60  CREATININE 0.77  --  0.75  TROPONINIHS  --  4  --     Estimated Creatinine Clearance: 97.1 mL/min (by C-G formula based on SCr of 0.75 mg/dL).   Medical History: Past Medical History:  Diagnosis Date  . 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 Complication of anesthesia    trouble with airway  . Edema 12/22/2008  . HYPERTENSION 12/25/2007  . HYPOTHYROIDISM, POSTSURGICAL 05/04/2008  . LEG PAIN, LEFT 12/09/2008  . OSA (obstructive sleep apnea) 06/15/2013   Assessment: 68 y/o COVID+ female here with worsening cough and shortness of breath. CT Angio is positive for acute PE. Pt completed Xarelto therapy a few months ago for DVT in the leg. CBC/renal function WNL. Pharmacy consulted to dose heparin. Initial heparin level therapeutic. No active bleed or issues with the infusion reported.  Goal of Therapy:  Heparin level 0.3-0.7 units/ml Monitor platelets by anticoagulation protocol: Yes   Plan:  Continue heparin drip at 1400 units/hr 6h heparin level to confirm Monitor daily heparin level and CBC, s/sx bleeding   Elicia Lamp, PharmD, BCPS Please check AMION for all [place] contact numbers Clinical  Pharmacist 08/22/2019 11:42 AM

## 2019-08-22 NOTE — ED Notes (Signed)
Placed bsc in patient room for patient comfort and set up meal tray for pt.

## 2019-08-23 ENCOUNTER — Inpatient Hospital Stay (HOSPITAL_COMMUNITY): Payer: Medicare HMO

## 2019-08-23 DIAGNOSIS — Z6841 Body Mass Index (BMI) 40.0 and over, adult: Secondary | ICD-10-CM | POA: Diagnosis not present

## 2019-08-23 DIAGNOSIS — E890 Postprocedural hypothyroidism: Secondary | ICD-10-CM | POA: Diagnosis not present

## 2019-08-23 DIAGNOSIS — I2699 Other pulmonary embolism without acute cor pulmonale: Secondary | ICD-10-CM | POA: Diagnosis not present

## 2019-08-23 DIAGNOSIS — U071 COVID-19: Secondary | ICD-10-CM | POA: Diagnosis not present

## 2019-08-23 DIAGNOSIS — J9601 Acute respiratory failure with hypoxia: Secondary | ICD-10-CM | POA: Diagnosis not present

## 2019-08-23 DIAGNOSIS — K219 Gastro-esophageal reflux disease without esophagitis: Secondary | ICD-10-CM | POA: Diagnosis not present

## 2019-08-23 DIAGNOSIS — I10 Essential (primary) hypertension: Secondary | ICD-10-CM | POA: Diagnosis not present

## 2019-08-23 DIAGNOSIS — I951 Orthostatic hypotension: Secondary | ICD-10-CM | POA: Diagnosis not present

## 2019-08-23 DIAGNOSIS — J1282 Pneumonia due to Coronavirus disease 2019: Secondary | ICD-10-CM | POA: Diagnosis not present

## 2019-08-23 DIAGNOSIS — R791 Abnormal coagulation profile: Secondary | ICD-10-CM

## 2019-08-23 DIAGNOSIS — R001 Bradycardia, unspecified: Secondary | ICD-10-CM | POA: Insufficient documentation

## 2019-08-23 HISTORY — DX: Other pulmonary embolism without acute cor pulmonale: I26.99

## 2019-08-23 LAB — COMPREHENSIVE METABOLIC PANEL
ALT: 19 U/L (ref 0–44)
AST: 13 U/L — ABNORMAL LOW (ref 15–41)
Albumin: 3.1 g/dL — ABNORMAL LOW (ref 3.5–5.0)
Alkaline Phosphatase: 78 U/L (ref 38–126)
Anion gap: 10 (ref 5–15)
BUN: 16 mg/dL (ref 8–23)
CO2: 23 mmol/L (ref 22–32)
Calcium: 9 mg/dL (ref 8.9–10.3)
Chloride: 107 mmol/L (ref 98–111)
Creatinine, Ser: 0.99 mg/dL (ref 0.44–1.00)
GFR calc Af Amer: >60 mL/min (ref >60)
GFR calc non Af Amer: 59 mL/min — ABNORMAL LOW (ref >60)
Glucose, Bld: 129 mg/dL — ABNORMAL HIGH (ref 70–99)
Potassium: 3.4 mmol/L — ABNORMAL LOW (ref 3.5–5.1)
Sodium: 140 mmol/L (ref 135–145)
Total Bilirubin: 0.3 mg/dL (ref 0.3–1.2)
Total Protein: 6.3 g/dL — ABNORMAL LOW (ref 6.5–8.1)

## 2019-08-23 LAB — CBC WITH DIFFERENTIAL/PLATELET
Abs Immature Granulocytes: 0.25 10*3/uL — ABNORMAL HIGH (ref 0.00–0.07)
Basophils Absolute: 0 10*3/uL (ref 0.0–0.1)
Basophils Relative: 0 %
Eosinophils Absolute: 0 10*3/uL (ref 0.0–0.5)
Eosinophils Relative: 0 %
HCT: 37.9 % (ref 36.0–46.0)
Hemoglobin: 12.1 g/dL (ref 12.0–15.0)
Immature Granulocytes: 2 %
Lymphocytes Relative: 14 %
Lymphs Abs: 1.7 10*3/uL (ref 0.7–4.0)
MCH: 28.3 pg (ref 26.0–34.0)
MCHC: 31.9 g/dL (ref 30.0–36.0)
MCV: 88.6 fL (ref 80.0–100.0)
Monocytes Absolute: 1.2 10*3/uL — ABNORMAL HIGH (ref 0.1–1.0)
Monocytes Relative: 10 %
Neutro Abs: 9.3 10*3/uL — ABNORMAL HIGH (ref 1.7–7.7)
Neutrophils Relative %: 74 %
Platelets: 230 10*3/uL (ref 150–400)
RBC: 4.28 MIL/uL (ref 3.87–5.11)
RDW: 14.5 % (ref 11.5–15.5)
WBC: 12.5 10*3/uL — ABNORMAL HIGH (ref 4.0–10.5)
nRBC: 0 % (ref 0.0–0.2)

## 2019-08-23 LAB — TSH: TSH: 0.08 u[IU]/mL — ABNORMAL LOW (ref 0.350–4.500)

## 2019-08-23 LAB — HEPARIN LEVEL (UNFRACTIONATED): Heparin Unfractionated: 0.44 IU/mL (ref 0.30–0.70)

## 2019-08-23 LAB — TROPONIN I (HIGH SENSITIVITY)
Troponin I (High Sensitivity): 4 ng/L (ref <18)
Troponin I (High Sensitivity): 5 ng/L (ref <18)

## 2019-08-23 LAB — MAGNESIUM: Magnesium: 2.1 mg/dL (ref 1.7–2.4)

## 2019-08-23 LAB — D-DIMER, QUANTITATIVE: D-Dimer, Quant: 11.09 ug/mL-FEU — ABNORMAL HIGH (ref 0.00–0.50)

## 2019-08-23 LAB — C-REACTIVE PROTEIN: CRP: 0.6 mg/dL (ref <1.0)

## 2019-08-23 LAB — FERRITIN: Ferritin: 155 ng/mL (ref 11–307)

## 2019-08-23 MED ORDER — POTASSIUM CHLORIDE CRYS ER 20 MEQ PO TBCR
40.0000 meq | EXTENDED_RELEASE_TABLET | Freq: Once | ORAL | Status: AC
  Administered 2019-08-23: 40 meq via ORAL
  Filled 2019-08-23: qty 2

## 2019-08-23 NOTE — Progress Notes (Signed)
Cardiology Consultation:   Patient ID: SHAWNTAL CONGO MRN: [id number]; DOB: 12-13-1950  Admit date: 08/21/2019 Date of Consult: 08/23/2019  Primary Care Provider: Lucianne Lei, MD Primary Cardiologist: No primary care provider on file.  Primary Electrophysiologist:  None    Patient Profile:   Natasha [name] is a 68 y.o. female with a hx of HTN, OSA, thyroid cancer, h/o of DVT 2019 previously on Xarelto, and recently diagnosed with COVID who is being seen today for the evaluation of bradycardia at the request of Dr. Tyrell [name].  History of Present Illness:   Natasha [name] reported she saw a cardiologist over 10 years ago for chest pain. Because her mother had a heart attack at 29 yo a Myoview test was ordered and  it came back normal. Denies history of a heart attack, heart failure, HLD, or stroke. She did say this summer she had a sudden black out and fell right on her face. She had no pre-drome symptoms. She went to see her doctor the next day and nothing was found. Denies tobacco/drug use. She drinks about once a month. She works part-time as an Lobbyist. Patient has a history of HTN for which she takes Lotensin, Diovan, and lasix 20 mg daily for HTN.   Patient presented to the ED 08/21/19 for shortness of breath, cough and fever for the last 2 weeks. She said she had fevers for 15 days straight. She has felt general weakness of has felt very tired as well. The patient went to an Urgent care 5 days ago and tested positive for COVID. She was treated with a steroid and anitbiotics. Since then she experienced chest soreness and worsening shortness of breath. She was noting O2 levels into the 80s.    In the ED BP 153/103, pulse 72, RR 21, 95% O2. Labs showed CO2 20, calcium 8.6, albumin 3.3, D-dimer 12.54, LDH 373. Patient was noted to have oxygen levels into the 80s and was placed on supplemental O2.  EKG showed NSR with no acute changes.CXR showed stable to slight improvement in bibasilar  opacities. Blood cultures were drawn. She was given a steroid shot as well as antibiotic and breathing inhaler. CTA chest showed PE with moderate clot burden, primarily in the right lung, ground glass opacities consistent with COVID-19. Patient was started on IV heparin and admitted.  EKG today showed sinus bradycardia, 44 bpm and cardiology was consulted. Telemetry shows sinus brady with rates in the 40s to 50s. The patient says over the last 2 weeks she has been noting low heart rates. She does feel mildly dizzy walking around.   Heart Pathway Score:     Past Medical History:  Diagnosis Date  . 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 Complication of anesthesia    trouble with airway  . Edema 12/22/2008  . HYPERTENSION 12/25/2007  . HYPOTHYROIDISM, POSTSURGICAL 05/04/2008  . LEG PAIN, LEFT 12/09/2008  . OSA (obstructive sleep apnea) 06/15/2013    Past Surgical History:  Procedure Laterality Date  . ABDOMINAL HYSTERECTOMY    . ANKLE SURGERY    . CERVICAL FUSION     x 2  . COLONOSCOPY WITH PROPOFOL N/A 08/08/2015   Procedure: COLONOSCOPY WITH PROPOFOL;  Surgeon: Natasha Craver, MD;  Location: [facility];  Service: Endoscopy;  Laterality: N/A;  . KNEE SURGERY    .  TOTAL THYROIDECTOMY       Home Medications:  Prior to Admission medications   Medication Sig Start Date End Date Taking? Authorizing Provider  albuterol (VENTOLIN HFA) 108 (90 Base) MCG/ACT inhaler Inhale 1-2 puffs into the lungs every 6 (six) hours as needed for wheezing or shortness of breath. 08/18/19  Yes [name], Natasha C, PA-[name]  amLODipine (NORVASC) 5 MG tablet Take 5 mg by mouth at bedtime. 06/22/19  Yes [provider]  benzonatate (TESSALON) 200 MG capsule Take 1 capsule (200 mg total) by mouth 3 (three) times daily as needed for up to 7 days for cough.  08/18/19 08/25/19 Yes [name], Natasha C, PA-[name]  Dextromethorphan-Guaifenesin (MUCINEX DM MAXIMUM STRENGTH) 60-1200 MG TB12 Take 1 tablet by mouth every 12 (twelve) hours. 12/31/15  Yes Natasha Knapp, MD  doxycycline (VIBRAMYCIN) 100 MG capsule Take 1 capsule (100 mg total) by mouth 2 (two) times daily for 10 days. 08/18/19 08/28/19 Yes [name], Natasha C, PA-[name]  fexofenadine (ALLEGRA) 180 MG tablet Take 180 mg by mouth daily. 06/15/19  Yes [provider]  furosemide (LASIX) 20 MG tablet Start by taking 1 tablet every other day. Patient taking differently: Take 20 mg by mouth daily as needed for fluid.  02/04/18  Yes Natasha Circle, PA-[name]  levothyroxine (SYNTHROID) 150 MCG tablet Take 150 mcg by mouth daily. 06/08/19  Yes [provider]  meloxicam (MOBIC) 15 MG tablet Take 15 mg by mouth daily. 07/27/19  Yes [provider]  Multiple Vitamin (MULTIVITAMIN WITH MINERALS) TABS tablet Take 1 tablet by mouth daily.   Yes [provider]  MYRBETRIQ 25 MG TB24 tablet Take 25 mg by mouth daily. 12/14/17  Yes [provider]  valsartan-hydrochlorothiazide (DIOVAN-HCT) 160-12.5 MG tablet Take 1 tablet by mouth daily. 01/20/18  Yes [provider]  benazepril-hydrochlorthiazide (LOTENSIN HCT) 20-25 MG tablet TAKE 1 TABLET BY MOUTH DAILY. Patient not taking: No sig reported 02/25/16   Natasha Knapp, MD  ibuprofen (ADVIL,MOTRIN) 600 MG tablet Take 1 tablet (600 mg total) by mouth every 6 (six) hours as needed. Patient not taking: Reported on 08/22/2019 11/01/15   Natasha Amber, MD  ipratropium (ATROVENT) 0.03 % nasal spray Place 2 sprays into the nose 4 (four) times daily. Patient not taking: Reported on 08/22/2019 12/31/15   Natasha Knapp, MD  Rivaroxaban 15 & 20 MG TBPK Take as directed on package: Start with one 15mg  tablet by mouth twice a day with food. On Day 22, switch to one 20mg  tablet once a day with food. Patient not taking: Reported on 08/22/2019 02/04/18    Natasha Circle, PA-[name]  traMADol (ULTRAM) 50 MG tablet Take 1 tablet (50 mg total) by mouth every 6 (six) hours as needed. Patient not taking: Reported on 08/22/2019 02/14/18   Natasha Circle, PA-[name]    Inpatient Medications: Scheduled Meds: . dexamethasone (DECADRON) injection  6 mg Intravenous Daily  . irbesartan  150 mg Oral Daily  . levothyroxine  150 mcg Oral QAC breakfast  . potassium chloride  40 mEq Oral Once  . sodium chloride flush  3 mL Intravenous Q12H  . sodium chloride flush  3 mL Intravenous Q12H   Continuous Infusions: . sodium chloride    . heparin 1,400 Units/hr (08/23/19 1031)  . remdesivir 100 mg in NS 100 mL Stopped (08/23/19 1009)   PRN Meds: sodium chloride, acetaminophen **OR** acetaminophen, albuterol, fentaNYL (SUBLIMAZE) injection, guaiFENesin-dextromethorphan, ondansetron **OR** ondansetron (ZOFRAN) IV, senna-docusate, sodium chloride flush, zolpidem  Allergies:  Allergies  Allergen Reactions  . Codeine Shortness Of Breath and Palpitations  . Eggs Or Egg-Derived Products Nausea And Vomiting    PROJECTILE VOMITING    Social History:   Social History   Socioeconomic History  . Marital status: Widowed    Spouse name: Not on file  . Number of children: Not on file  . Years of education: Not on file  . Highest education level: Not on file  Occupational History  . Occupation: Pharmacist, hospital  Tobacco Use  . Smoking status: Never Smoker  . Smokeless tobacco: Never Used  Substance and Sexual Activity  . Alcohol use: No  . Drug use: No  . Sexual activity: Not on file  Other Topics Concern  . Not on file  Social History Narrative   Pt has children   Social Determinants of Health   Financial Resource Strain:   . Difficulty of Paying Living Expenses: Not on file  Food Insecurity:   . Worried About Charity fundraiser in the Last Year: Not on file  . Ran Out of Food in the Last Year: Not on file  Transportation Needs:   . Lack of Transportation  (Medical): Not on file  . Lack of Transportation (Non-Medical): Not on file  Physical Activity:   . Days of Exercise per Week: Not on file  . Minutes of Exercise per Session: Not on file  Stress:   . Feeling of Stress : Not on file  Social Connections:   . Frequency of Communication with Friends and Family: Not on file  . Frequency of Social Gatherings with Friends and Family: Not on file  . Attends Religious Services: Not on file  . Active Member of Clubs or Organizations: Not on file  . Attends Archivist Meetings: Not on file  . Marital Status: Not on file  Intimate Partner Violence:   . Fear of Current or Ex-Partner: Not on file  . Emotionally Abused: Not on file  . Physically Abused: Not on file  . Sexually Abused: Not on file    Family History:   Family History  Problem Relation Age of Onset  . Heart disease Mother   . Kidney disease Mother      ROS:  Please see the history of present illness.  All other ROS reviewed and negative.     Physical Exam/Data:   Vitals:   08/23/19 0800 08/23/19 0900 08/23/19 0930 08/23/19 0945  BP: 135/89 (!) 171/88    Pulse: (!) 44 64 (!) 48 (!) 51  Resp: 17 17 (!) 23 (!) 28  Temp:      TempSrc:      SpO2: 95% 91% 97% 96%  Weight:      Height:        Intake/Output Summary (Last 24 hours) at 08/23/2019 1036 Last data filed at 08/23/2019 1035 Gross per 24 hour  Intake 200 ml  Output 900 ml  Net -700 ml   Last 3 Weights 08/21/2019 02/27/2018 02/03/2018  Weight (lbs) 300 lb 297 lb 290 lb  Weight (kg) 136.079 kg 134.718 kg 131.543 kg     Body mass index is 46.99 kg/m.   Physical Exam not performed due to COVID positive status   EKG:  The EKG was personally reviewed and demonstrates:  Sinus bradycardia, 44 bpm Telemetry:  Telemetry was personally reviewed and demonstrates:  NSR   Relevant CV Studies:  Limited Echo 08/22/19 . Left ventricular ejection fraction, by visual estimation, is 65 to 70%. The  left ventricle  has hyperdynamic function. There is no left ventricular hypertrophy.  2. The left ventricle has no regional wall motion abnormalities.  3. Global right ventricle has low normal systolic function.The right ventricular size is mildly enlarged. No increase in right ventricular wall thickness.  4. Presence of pericardial fat pad.  5. The mitral valve is grossly normal. Trivial mitral valve regurgitation.  6. The tricuspid valve is grossly normal. Tricuspid valve regurgitation is not demonstrated.  7. The tricuspid valve was normal in structure. Tricuspid valve regurgitation is not demonstrated.  8. The aortic valve is grossly normal. Aortic valve regurgitation is not visualized. No evidence of aortic valve sclerosis or stenosis.  9. The inferior vena cava is dilated in size with <50% respiratory variability, suggesting right atrial pressure of 15 mmHg. 10. Technically challenging study. Normal LV function. RV appears to be mildly enlarged, with borderline normal function. Unable to visualize PA well. 11. The interatrial septum was not assessed.  Laboratory Data:  High Sensitivity Troponin:   Recent Labs  Lab 08/21/19 2327  TROPONINIHS 4     Chemistry Recent Labs  Lab 08/21/19 1842 08/22/19 1054 08/23/19 0428  NA 140 139 140  K 4.1 3.7 3.4*  CL 109 110 107  CO2 20* 21* 23  GLUCOSE 98 138* 129*  BUN 15 11 16   CREATININE 0.77 0.75 0.99  CALCIUM 8.6* 8.7* 9.0  GFRNONAA >60 >60 59*  GFRAA >60 >60 >60  ANIONGAP 11 8 10     Recent Labs  Lab 08/21/19 1842 08/22/19 1054 08/23/19 0428  PROT 6.5 7.0 6.3*  ALBUMIN 3.3* 3.2* 3.1*  AST 32 20 13*  ALT 19 17 19   ALKPHOS 86 85 78  BILITOT 1.0 0.5 0.3   Hematology Recent Labs  Lab 08/21/19 1842 08/22/19 1054 08/23/19 0428  WBC 7.3 6.7 12.5*  RBC 4.36 4.46 4.28  HGB 12.4 12.7 12.1  HCT 39.4 39.6 37.9  MCV 90.4 88.8 88.6  MCH 28.4 28.5 28.3  MCHC 31.5 32.1 31.9  RDW 14.6 14.3 14.5  PLT 223 220 230   BNPNo results for  input(s): BNP, PROBNP in the last 168 hours.  DDimer  Recent Labs  Lab 08/21/19 1842 08/22/19 1054 08/23/19 0428  DDIMER 12.54* 13.55* 11.09*     Radiology/Studies:  CT Angio Chest PE W and/or Wo Contrast  Result Date: 08/21/2019 CLINICAL DATA:  PE suspected, low/intermediate prob, positive D-dimer covid+ COVID-19 positive 4 days ago, worsening shortness of breath, cough, and difficulty breathing. EXAM: CT ANGIOGRAPHY CHEST WITH CONTRAST TECHNIQUE: Multidetector CT imaging of the chest was performed using the standard protocol during bolus administration of intravenous contrast. Multiplanar CT image reconstructions and MIPs were obtained to evaluate the vascular anatomy. CONTRAST:  90mL OMNIPAQUE IOHEXOL 350 MG/ML SOLN COMPARISON:  Chest radiograph earlier this day. Chest CTA 02/15/2018, additional priors. FINDINGS: Cardiovascular: Positive for acute pulmonary embolus. There are filling defects in the distal right main pulmonary artery extending into upper, middle, and lower lobar branches. Segmental involvement in the upper and lower lobes. Segmental pulmonary emboli in the left lower lobe. Thromboembolic burden is moderate. While the RV to LV ratio is 1.15, patient had a similar RV to LV ratio on prior imaging prior to pulmonary embolus on CT last year. No aortic dissection. Upper normal heart size. No pericardial effusion. Mediastinum/Nodes: No enlarged mediastinal or hilar lymph thyroidectomy. No esophageal wall thickening. Lungs/Pleura: Breathing motion artifact partially obscures evaluation. There are patchy ground-glass opacities throughout both lungs, mild in degree. Perifissural  nodule in the right lung anteriorly, series 7, image 48, has slightly decreased in size from prior exam currently 7 mm, previously 9 mm, this is consistent with benign etiology. No pleural fluid. Trachea and mainstem bronchi are grossly patent, not well assessed given expiratory phase. Upper Abdomen: No acute  abnormality. Musculoskeletal: Multilevel degenerative change throughout the spine. There are no acute or suspicious osseous abnormalities. Review of the MIP images confirms the above findings. IMPRESSION: 1. Positive for acute pulmonary embolus with moderate clot burden, primarily in the right lung. Thrombus involves the distal main pulmonary artery and extends into the lobar and segmental branches. While the RV to LV ratio is elevated, similar RV to LV ratio is seen on CT 18 months ago prior to pulmonary embolus suggesting this is chronic. 2. Mild patchy ground-glass opacities in both lungs consistent with COVID-19, mild parenchymal involvement. 3. Right pulmonary nodule has diminished in size from CT 18 months ago, currently 7 mm, previously 9 mm, consistent with benign etiology. Critical Value/emergent results were called by telephone at the time of interpretation on 08/21/2019 at 10:17 pm to [place] , who verbally acknowledged these results. Electronically Signed   By: Keith Rake M.D.   On: 08/21/2019 22:18   DG Chest Port 1 View  Result Date: 08/21/2019 CLINICAL DATA:  Shortness of breath. EXAM: PORTABLE CHEST 1 VIEW COMPARISON:  08/18/2019 FINDINGS: Mild bibasilar opacities are again noted and stable to minimally improved. The cardiomediastinal silhouette is unremarkable. No pleural effusion or pneumothorax. No new pulmonary opacities are present. IMPRESSION: Stable to slight improvement in bibasilar opacities. No other significant change. Electronically Signed   By: Margarette Canada M.D.   On: 08/21/2019 18:37   ECHOCARDIOGRAM LIMITED  Result Date: 08/22/2019   ECHOCARDIOGRAM LIMITED REPORT   Patient Name:   GARI VEITH Menn Date of Exam: 08/22/2019 Medical Rec #:  [id number]      Height:       67.0 in Accession #:    [id number]     Weight:       300.0 lb Date of Birth:  1950/09/27       BSA:          2.40 m Patient Age:    89 years       BP:           163/101 mmHg Patient Gender: F               HR:           70 bpm. Exam Location:  Inpatient  Procedure: Limited Echo, Limited Color Doppler and Cardiac Doppler Indications:    pulmonary embolus 415.19  History:        Patient has prior history of Echocardiogram examinations, most                 recent 02/02/2008.  Sonographer:    Johny Chess Referring PhysLW:[id number] TIMOTHY S OPYD  Sonographer Comments: Image acquisition challenging due to respiratory motion. IMPRESSIONS  1. Left ventricular ejection fraction, by visual estimation, is 65 to 70%. The left ventricle has hyperdynamic function. There is no left ventricular hypertrophy.  2. The left ventricle has no regional wall motion abnormalities.  3. Global right ventricle has low normal systolic function.The right ventricular size is mildly enlarged. No increase in right ventricular wall thickness.  4. Presence of pericardial fat pad.  5. The mitral valve is grossly normal. Trivial mitral valve regurgitation.  6. The tricuspid valve is grossly  normal. Tricuspid valve regurgitation is not demonstrated.  7. The tricuspid valve was normal in structure. Tricuspid valve regurgitation is not demonstrated.  8. The aortic valve is grossly normal. Aortic valve regurgitation is not visualized. No evidence of aortic valve sclerosis or stenosis.  9. The inferior vena cava is dilated in size with <50% respiratory variability, suggesting right atrial pressure of 15 mmHg. 10. Technically challenging study. Normal LV function. RV appears to be mildly enlarged, with borderline normal function. Unable to visualize PA well. 11. The interatrial septum was not assessed. FINDINGS  Left Ventricle: Left ventricular ejection fraction, by visual estimation, is 65 to 70%. The left ventricle has hyperdynamic function. The left ventricle has no regional wall motion abnormalities. There is no increased left ventricular wall thickness. Left ventricular diastolic parameters were normal. Right Ventricle: The right ventricular size is  mildly enlarged. Right vetricular wall thickness was not assessed. Global RV systolic function is has low normal systolic function. Left Atrium: Left atrial size was normal in size. Right Atrium: Right atrial size was normal in size. Right atrial pressure is estimated at 15 mmHg. Pericardium: Trivial pericardial effusion is present is seen. There is no evidence of pericardial effusion. Presence of pericardial fat pad. Mitral Valve: The mitral valve is grossly normal. MV Area by PHT, 2.69 cm. MV PHT, 81.78 msec. Trivial mitral valve regurgitation. Tricuspid Valve: The tricuspid valve is normal in structure. Tricuspid valve regurgitation is not demonstrated. Aortic Valve: The aortic valve is grossly normal. Aortic valve regurgitation is not visualized. The aortic valve is structurally normal, with no evidence of sclerosis or stenosis. Pulmonic Valve: The pulmonic valve was not well visualized. Pulmonic valve regurgitation is not visualized by color flow Doppler. Pulmonic regurgitation is not visualized by color flow Doppler. Aorta: The aortic root and ascending aorta are structurally normal, with no evidence of dilitation. Pulmonary Artery: The pulmonary artery is not well seen. Venous: The inferior vena cava is dilated in size with less than 50% respiratory variability, suggesting right atrial pressure of 15 mmHg. Shunts: The interatrial septum was not assessed.  LEFT VENTRICLE          Normals PLAX 2D LVIDd:         4.40 cm  3.6 cm   Diastology                  Normals LVIDs:         2.40 cm  1.7 cm   LV e' lateral:   13.30 cm/s 6.42 cm/s LV PW:         1.10 cm  1.4 cm   LV E/e' lateral: 3.6        15.4 LV IVS:        1.00 cm  1.3 cm   LV e' medial:    9.79 cm/s  6.96 cm/s LVOT diam:     2.20 cm  2.0 cm   LV E/e' medial:  4.9        6.96 LV SV:         68 ml    79 ml LV SV Index:   25.86    45 ml/m2 LVOT Area:     3.80 cm 3.14 cm2  RIGHT VENTRICLE RV S prime:     17.70 cm/s TAPSE (M-mode): 3.2 cm LEFT ATRIUM          Index LA diam:    4.40 cm 1.83 cm/m  AORTIC VALVE  Normals LVOT Vmax:   86.80 cm/s LVOT Vmean:  65.400 cm/s 75 cm/s LVOT VTI:    0.207 m     25.3 cm  AORTA                 Normals Ao Root diam: 3.40 cm 31 mm Ao Asc diam:  3.50 cm 31 mm MITRAL VALVE              Normals MV Area (PHT): 2.69 cm             SHUNTS MV PHT:        81.78 msec 55 ms     Systemic VTI:  0.21 m MV Decel Time: 282 msec   187 ms    Systemic Diam: 2.20 cm MV E velocity: 48.00 cm/s 103 cm/s MV A velocity: 69.00 cm/s 70.3 cm/s MV E/A ratio:  0.70       1.5  Buford Dresser MD Electronically signed by Buford Dresser MD Signature Date/Time: 08/22/2019/2:59:16 PMThe mitral valve is grossly normal.    Final        [id number]     Assessment and Plan:   Pulmonary Embolism Patient has h/o of DVT previously on Xarelto. She had a COVID positive test 4 days ago, presented to the ED with worsening shortness of breath. CXR was stable. D-dimer was elevated. CTA showed PE, mainly in the right lung with moderate clot burden. Started on IV heparin.  - Echo showed EF 65-70%, no LVH, mildly enlarged RV, trivial MR, low normal RV function - Bps reasonable.  - Continues to be on supplemental O2 - continue IV heparin - Plan for Lower extremity dopplers   COVID positive - Tested positive at an urgent care a couple days prior - Imaging significant for ground glass opacities consistent with COVID - WBC normal. Procal/CRP/Lactic acid wnl - Blood cultures negative so far - On remedisivir. management per IM - Plan [facility]  Bradycardia On admission rates were in the 70s. Today EKG showed sinus brady with rates in the 40s. Telemetry shows rates from 40-60. Patient reported her rates have been low over the last 2 weeks. Her baseline around 60-70s. In 2017 EKG reported HR on 50s with first degree AV block. Patient reported this summer she had a complete black out experience falling on her face with no  prodrome symptoms.  - Patient is not on a BB at baseline - Echo appears stable - TSH 0.80 - HS troponin 4 - potassium 3.4 . Goal > 4 - Bradycardia in the setting of PE and COVID+. Unsure if dizziness is from bradycardia vs COVID. However, given patient's story of sudden LOC would recommend a heart monitor to monitor arrhythmias. Continue telemetry for now. Further work-up would be outpatient once patient has recovered from acute illness.   Hypothyroidism - Synthroid  HTN - home meds Norvasc and diuretic held - Continue with Valsartan - pressures reasonable   For questions or updates, please contact [place] Please consult [email or web address] for contact info under     Signed, Yishai Rehfeld Ninfa Meeker, PA-[name]  08/23/2019 10:36 AM

## 2019-08-23 NOTE — Progress Notes (Addendum)
PROGRESS NOTE    Natasha [name]  [id number] DOB: 11-03-1950 DOA: 08/21/2019 PCP: Lucianne Lei, MD   Brief Narrative: 68 year old with past medical history significant for hypertension, hypothyroidism, OSA, history of left lower extremity DVT treated with Xarelto completed treatment 3 to 4 months prior to admission who presents complaining of fever cough and shortness of breath.  Patient developed symptoms 2 weeks prior to admission.  Patient was evaluated at urgent care 3 days ago for shortness of breath, she was found to be positive for Covid treated with intramuscular Decadron and sent home on doxycycline.  Patient presents with worsening shortness of breath, oxygen saturation at home in the 80s.  Evaluation in the ED patient was found to be tachypneic, afebrile, oxygen saturation 80 on room air.  CT angio concerning with acute pulmonary embolism with moderate clot burden, groundglass opacity bilaterally.  D-dimer 12.  Patient was a started on IV heparin infusion, remdesivir and steroids.    Assessment & Plan:   Principal Problem:   Acute pulmonary embolism (HCC) Active Problems:   HYPOTHYROIDISM, POSTSURGICAL   Hypertension  1-Acute Pulmonary Embolism; Acute Hypoxic Respiratory Failure; -Continue with Heparin Gtt for at least 48 hours.  -ECHO: low normal right ventricular function, no increase right ventricular wall thickening. Discussed with CCM , BP stable, plan to continue with heparin Gtt.  -Blood pressure stable, oxygen saturation 91 on 2 L. -Check Doppler lower extremity.   2-Covid 19 PNA;  Symptoms for 2 weeks.  Continue with Decadron and Remdesivir.  Follow inflammatory markers.  CRP not significantly elevated.  COVID-19 Labs  Recent Labs    08/21/19 1842 08/21/19 2002 08/22/19 1054 08/23/19 0428  DDIMER 12.54*  --  13.55* 11.09*  FERRITIN  --  124 152 155  LDH 373*  --   --   --   CRP  --  0.8 0.8 0.6    No results found for: SARSCOV2NAA    3-Bradycardia;  Will replete Potassium.  Will check TSH. She denies chest pain or dyspnea. She does report dizziness when she uses bedside commode.  Cardiology has been consulted.  Check troponin.  BP stable 140 range.  Discussed with cardiology, ok to transfer to [facility].   HTN;  Continue with Valsartan.  Hold diuretics and Norvasc for now.  4-Hypothyroidism;  Continue with Synthroid.   5-right thigh tingling and calf; replete potassium, awaiting doppler. She denies weakness.    Estimated body mass index is 46.99 kg/m as calculated from the following:   Height as of this encounter: 5\' 7"  (1.702 m).   Weight as of this encounter: 136.1 kg.   DVT prophylaxis: Heparin  Code Status: full code Family Communication: Care discussed with patient and multifactorial  Disposition Plan: Remain in the hospital for treatment of covid PNA, PE. Needs atleast 48 hours of heparin Gtt prior transition to oral anticoagulation. Awaiting ECHO result. Consultants:   None  Procedures:   ECHO normal Systolic function, low normal right ventricular function.   Doppler; pending  Antimicrobials/ Others:  Remdesivir  Subjective: Patient denies dyspnea or chest pain. She had chest pain yesterday when they were doing ECHO.  She report tingling in her right thigh and calf area only, localized. No LE weakness.  She report mild dizziness when she is up to use bedside commode and gets disoriented.   Objective: Vitals:   08/23/19 0400 08/23/19 0445 08/23/19 0500 08/23/19 0600  BP: (!) 149/96  139/86 (!) 134/91  Pulse: (!) 43 (!) 40  Marland Kitchen)  49  Resp: 20 20  (!) 24  Temp:      TempSrc:      SpO2: 96% 98%  94%  Weight:      Height:        Intake/Output Summary (Last 24 hours) at 08/23/2019 0751 Last data filed at 08/22/2019 1049 Gross per 24 hour  Intake 100 ml  Output --  Net 100 ml   Filed Weights   08/21/19 1827  Weight: 136.1 kg    Examination:  General exam: NAD Respiratory  system: Normal respiratory effort Cardiovascular system: S 1, S 2 RRR Gastrointestinal system: BS present, soft, nt Central nervous system: non focal.  Extremities: Symmetric powwer Skin: no rashes   Data Reviewed: I have personally reviewed following labs and imaging studies  CBC: Recent Labs  Lab 08/21/19 1842 08/22/19 1054 08/23/19 0428  WBC 7.3 6.7 12.5*  NEUTROABS 4.4 5.1 9.3*  HGB 12.4 12.7 12.1  HCT 39.4 39.6 37.9  MCV 90.4 88.8 88.6  PLT 223 220 [id number]   Basic Metabolic Panel: Recent Labs  Lab 08/21/19 1842 08/22/19 1054 08/23/19 0428  NA 140 139 140  K 4.1 3.7 3.4*  CL 109 110 107  CO2 20* 21* 23  GLUCOSE 98 138* 129*  BUN 15 11 16   CREATININE 0.77 0.75 0.99  CALCIUM 8.6* 8.7* 9.0  MG  --  1.9 2.1   GFR: Estimated Creatinine Clearance: 78.5 mL/min (by C-G formula based on SCr of 0.99 mg/dL). Liver Function Tests: Recent Labs  Lab 08/21/19 1842 08/22/19 1054 08/23/19 0428  AST 32 20 13*  ALT 19 17 19   ALKPHOS 86 85 78  BILITOT 1.0 0.5 0.3  PROT 6.5 7.0 6.3*  ALBUMIN 3.3* 3.2* 3.1*   No results for input(s): LIPASE, AMYLASE in the last 168 hours. No results for input(s): AMMONIA in the last 168 hours. Coagulation Profile: No results for input(s): INR, PROTIME in the last 168 hours. Cardiac Enzymes: No results for input(s): CKTOTAL, CKMB, CKMBINDEX, TROPONINI in the last 168 hours. BNP (last 3 results) No results for input(s): PROBNP in the last 8760 hours. HbA1C: No results for input(s): HGBA1C in the last 72 hours. CBG: No results for input(s): GLUCAP in the last 168 hours. Lipid Profile: Recent Labs    08/21/19 1842  TRIG 231*   Thyroid Function Tests: No results for input(s): TSH, T4TOTAL, FREET4, T3FREE, THYROIDAB in the last 72 hours. Anemia Panel: Recent Labs    08/22/19 1054 08/23/19 0428  FERRITIN 152 155   Sepsis Labs: Recent Labs  Lab 08/21/19 1842 08/21/19 1936 08/21/19 2000  PROCALCITON <0.10  --   --    LATICACIDVEN  --  1.4 1.3    Recent Results (from the past 240 hour(s))  Urine culture     Status: Abnormal   Collection Time: 08/18/19  4:53 PM   Specimen: Urine, Random  Result Value Ref Range Status   Specimen Description URINE, RANDOM  Final   Special Requests NONE  Final   Culture (A)  Final    <10,000 COLONIES/mL INSIGNIFICANT GROWTH Performed at [facility], [place] [address], [place], [place] [address]    Report Status 08/19/2019 FINAL  Final  Blood Culture (routine x 2)     Status: None (Preliminary result)   Collection Time: 08/21/19  8:00 PM   Specimen: BLOOD RIGHT HAND  Result Value Ref Range Status   Specimen Description BLOOD RIGHT HAND  Final   Special Requests   Final  BOTTLES DRAWN AEROBIC AND ANAEROBIC Blood Culture adequate volume   Culture   Final    NO GROWTH 2 DAYS Performed at [facility], [place] [address], [place], [place] [address]    Report Status PENDING  Incomplete  Blood Culture (routine x 2)     Status: None (Preliminary result)   Collection Time: 08/21/19  8:00 PM   Specimen: BLOOD LEFT HAND  Result Value Ref Range Status   Specimen Description BLOOD LEFT HAND  Final   Special Requests   Final    BOTTLES DRAWN AEROBIC AND ANAEROBIC Blood Culture results may not be optimal due to an inadequate volume of blood received in culture bottles   Culture   Final    NO GROWTH 2 DAYS Performed at [facility], [place] [address], [place],  [address]    Report Status PENDING  Incomplete         Radiology Studies: CT Angio Chest PE W and/or Wo Contrast  Result Date: 08/21/2019 CLINICAL DATA:  PE suspected, low/intermediate prob, positive D-dimer covid+ COVID-19 positive 4 days ago, worsening shortness of breath, cough, and difficulty breathing. EXAM: CT ANGIOGRAPHY CHEST WITH CONTRAST TECHNIQUE: Multidetector CT imaging of the chest was performed using the standard protocol during bolus administration of intravenous contrast.  Multiplanar CT image reconstructions and MIPs were obtained to evaluate the vascular anatomy. CONTRAST:  26mL OMNIPAQUE IOHEXOL 350 MG/ML SOLN COMPARISON:  Chest radiograph earlier this day. Chest CTA 02/15/2018, additional priors. FINDINGS: Cardiovascular: Positive for acute pulmonary embolus. There are filling defects in the distal right main pulmonary artery extending into upper, middle, and lower lobar branches. Segmental involvement in the upper and lower lobes. Segmental pulmonary emboli in the left lower lobe. Thromboembolic burden is moderate. While the RV to LV ratio is 1.15, patient had a similar RV to LV ratio on prior imaging prior to pulmonary embolus on CT last year. No aortic dissection. Upper normal heart size. No pericardial effusion. Mediastinum/Nodes: No enlarged mediastinal or hilar lymph thyroidectomy. No esophageal wall thickening. Lungs/Pleura: Breathing motion artifact partially obscures evaluation. There are patchy ground-glass opacities throughout both lungs, mild in degree. Perifissural nodule in the right lung anteriorly, series 7, image 48, has slightly decreased in size from prior exam currently 7 mm, previously 9 mm, this is consistent with benign etiology. No pleural fluid. Trachea and mainstem bronchi are grossly patent, not well assessed given expiratory phase. Upper Abdomen: No acute abnormality. Musculoskeletal: Multilevel degenerative change throughout the spine. There are no acute or suspicious osseous abnormalities. Review of the MIP images confirms the above findings. IMPRESSION: 1. Positive for acute pulmonary embolus with moderate clot burden, primarily in the right lung. Thrombus involves the distal main pulmonary artery and extends into the lobar and segmental branches. While the RV to LV ratio is elevated, similar RV to LV ratio is seen on CT 18 months ago prior to pulmonary embolus suggesting this is chronic. 2. Mild patchy ground-glass opacities in both lungs consistent  with COVID-19, mild parenchymal involvement. 3. Right pulmonary nodule has diminished in size from CT 18 months ago, currently 7 mm, previously 9 mm, consistent with benign etiology. Critical Value/emergent results were called by telephone at the time of interpretation on 08/21/2019 at 10:17 pm to [place] , who verbally acknowledged these results. Electronically Signed   By: Keith Rake M.D.   On: 08/21/2019 22:18   DG Chest Port 1 View  Result Date: 08/21/2019 CLINICAL DATA:  Shortness of  breath. EXAM: PORTABLE CHEST 1 VIEW COMPARISON:  08/18/2019 FINDINGS: Mild bibasilar opacities are again noted and stable to minimally improved. The cardiomediastinal silhouette is unremarkable. No pleural effusion or pneumothorax. No new pulmonary opacities are present. IMPRESSION: Stable to slight improvement in bibasilar opacities. No other significant change. Electronically Signed   By: Margarette Canada M.D.   On: 08/21/2019 18:37   ECHOCARDIOGRAM LIMITED  Result Date: 08/22/2019   ECHOCARDIOGRAM LIMITED REPORT   Patient Name:   Natasha [name] Date of Exam: 08/22/2019 Medical Rec #:  [id number]      Height:       67.0 in Accession #:    [id number]     Weight:       300.0 lb Date of Birth:  Apr 24, 1951       BSA:          2.40 m Patient Age:    79 years       BP:           163/101 mmHg Patient Gender: F              HR:           70 bpm. Exam Location:  Inpatient  Procedure: Limited Echo, Limited Color Doppler and Cardiac Doppler Indications:    pulmonary embolus 415.19  History:        Patient has prior history of Echocardiogram examinations, most                 recent 02/02/2008.  Sonographer:    Johny Chess Referring PhysLW:[id number] TIMOTHY S OPYD  Sonographer Comments: Image acquisition challenging due to respiratory motion. IMPRESSIONS  1. Left ventricular ejection fraction, by visual estimation, is 65 to 70%. The left ventricle has hyperdynamic function. There is no left ventricular hypertrophy.  2. The  left ventricle has no regional wall motion abnormalities.  3. Global right ventricle has low normal systolic function.The right ventricular size is mildly enlarged. No increase in right ventricular wall thickness.  4. Presence of pericardial fat pad.  5. The mitral valve is grossly normal. Trivial mitral valve regurgitation.  6. The tricuspid valve is grossly normal. Tricuspid valve regurgitation is not demonstrated.  7. The tricuspid valve was normal in structure. Tricuspid valve regurgitation is not demonstrated.  8. The aortic valve is grossly normal. Aortic valve regurgitation is not visualized. No evidence of aortic valve sclerosis or stenosis.  9. The inferior vena cava is dilated in size with <50% respiratory variability, suggesting right atrial pressure of 15 mmHg. 10. Technically challenging study. Normal LV function. RV appears to be mildly enlarged, with borderline normal function. Unable to visualize PA well. 11. The interatrial septum was not assessed. FINDINGS  Left Ventricle: Left ventricular ejection fraction, by visual estimation, is 65 to 70%. The left ventricle has hyperdynamic function. The left ventricle has no regional wall motion abnormalities. There is no increased left ventricular wall thickness. Left ventricular diastolic parameters were normal. Right Ventricle: The right ventricular size is mildly enlarged. Right vetricular wall thickness was not assessed. Global RV systolic function is has low normal systolic function. Left Atrium: Left atrial size was normal in size. Right Atrium: Right atrial size was normal in size. Right atrial pressure is estimated at 15 mmHg. Pericardium: Trivial pericardial effusion is present is seen. There is no evidence of pericardial effusion. Presence of pericardial fat pad. Mitral Valve: The mitral valve is grossly normal. MV Area by PHT, 2.69 cm. MV PHT, 81.78 msec. Trivial  mitral valve regurgitation. Tricuspid Valve: The tricuspid valve is normal in  structure. Tricuspid valve regurgitation is not demonstrated. Aortic Valve: The aortic valve is grossly normal. Aortic valve regurgitation is not visualized. The aortic valve is structurally normal, with no evidence of sclerosis or stenosis. Pulmonic Valve: The pulmonic valve was not well visualized. Pulmonic valve regurgitation is not visualized by color flow Doppler. Pulmonic regurgitation is not visualized by color flow Doppler. Aorta: The aortic root and ascending aorta are structurally normal, with no evidence of dilitation. Pulmonary Artery: The pulmonary artery is not well seen. Venous: The inferior vena cava is dilated in size with less than 50% respiratory variability, suggesting right atrial pressure of 15 mmHg. Shunts: The interatrial septum was not assessed.  LEFT VENTRICLE          Normals PLAX 2D LVIDd:         4.40 cm  3.6 cm   Diastology                  Normals LVIDs:         2.40 cm  1.7 cm   LV e' lateral:   13.30 cm/s 6.42 cm/s LV PW:         1.10 cm  1.4 cm   LV E/e' lateral: 3.6        15.4 LV IVS:        1.00 cm  1.3 cm   LV e' medial:    9.79 cm/s  6.96 cm/s LVOT diam:     2.20 cm  2.0 cm   LV E/e' medial:  4.9        6.96 LV SV:         68 ml    79 ml LV SV Index:   25.86    45 ml/m2 LVOT Area:     3.80 cm 3.14 cm2  RIGHT VENTRICLE RV S prime:     17.70 cm/s TAPSE (M-mode): 3.2 cm LEFT ATRIUM         Index LA diam:    4.40 cm 1.83 cm/m  AORTIC VALVE             Normals LVOT Vmax:   86.80 cm/s LVOT Vmean:  65.400 cm/s 75 cm/s LVOT VTI:    0.207 m     25.3 cm  AORTA                 Normals Ao Root diam: 3.40 cm 31 mm Ao Asc diam:  3.50 cm 31 mm MITRAL VALVE              Normals MV Area (PHT): 2.69 cm             SHUNTS MV PHT:        81.78 msec 55 ms     Systemic VTI:  0.21 m MV Decel Time: 282 msec   187 ms    Systemic Diam: 2.20 cm MV E velocity: 48.00 cm/s 103 cm/s MV A velocity: 69.00 cm/s 70.3 cm/s MV E/A ratio:  0.70       1.5  Buford Dresser MD Electronically signed by  Buford Dresser MD Signature Date/Time: 08/22/2019/2:59:16 PMThe mitral valve is grossly normal.    Final         Scheduled Meds: . dexamethasone (DECADRON) injection  6 mg Intravenous Daily  . irbesartan  150 mg Oral Daily  . levothyroxine  150 mcg Oral QAC breakfast  . potassium chloride  40 mEq Oral  Once  . potassium chloride  40 mEq Oral Once  . sodium chloride flush  3 mL Intravenous Q12H  . sodium chloride flush  3 mL Intravenous Q12H   Continuous Infusions: . sodium chloride    . heparin 1,400 Units/hr (08/21/19 2326)  . remdesivir 100 mg in NS 100 mL Stopped (08/22/19 1049)     LOS: 2 days    Time spent: 35 minutes.     Elmarie Shiley, MD [facility]   If 7PM-7AM, please contact night-coverage [email or web address] Password [facility] 08/23/2019, 7:51 AM

## 2019-08-23 NOTE — Progress Notes (Signed)
Lower extremity venous has been completed.   Preliminary results in CV Proc.   Abram Sander 08/23/2019 11:20 AM

## 2019-08-23 NOTE — Progress Notes (Signed)
[place] for Heparin  Indication: pulmonary embolus  Allergies  Allergen Reactions  . Codeine Shortness Of Breath and Palpitations  . Eggs Or Egg-Derived Products Nausea And Vomiting    PROJECTILE VOMITING    Patient Measurements: Height: 5\' 7"  (170.2 cm) Weight: 300 lb (136.1 kg) IBW/kg (Calculated) : 61.6 Heparin Dosing Weight: ~95 kg  Vital Signs: BP: 134/91 (12/21 0600) Pulse Rate: 49 (12/21 0600)  Labs: Recent Labs    08/21/19 1842 08/21/19 2327 08/22/19 1054 08/22/19 1715 08/23/19 0428  HGB 12.4  --  12.7  --  12.1  HCT 39.4  --  39.6  --  37.9  PLT 223  --  220  --  230  HEPARINUNFRC  --   --  0.60 0.68 0.44  CREATININE 0.77  --  0.75  --  0.99  TROPONINIHS  --  4  --   --   --     Estimated Creatinine Clearance: 78.5 mL/min (by C-G formula based on SCr of 0.99 mg/dL).   Medical History: Past Medical History:  Diagnosis Date  . 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 Complication of anesthesia    trouble with airway  . Edema 12/22/2008  . HYPERTENSION 12/25/2007  . HYPOTHYROIDISM, POSTSURGICAL 05/04/2008  . LEG PAIN, LEFT 12/09/2008  . OSA (obstructive sleep apnea) 06/15/2013   Assessment: 68 y/o COVID+ female here with worsening cough and shortness of breath. CT Angio is positive for acute pulmonary embolism. Patient completed Xarelto therapy a few months ago for DVT in the leg. Pharmacy consulted to dose IV heparin.   Heparin level remains at goal on 1400 units/hr.  CBC is stable.  No issues or bleeding reported.   Goal of Therapy:  Heparin level 0.3-0.7 units/ml Monitor platelets by anticoagulation protocol: Yes   Plan:  Continue heparin drip at 1400 units/hr Daily heparin level and CBC   Sloan Leiter, PharmD, BCPS, BCCCP Clinical  Pharmacist Please refer to [facility] for [facility] numbers 08/23/2019

## 2019-08-23 NOTE — Progress Notes (Signed)
Pt admitted from [facility]. Tele connected. Pt bradycardic, other V/S stable. Pt able to walk to bed from stretcher with cane. IV connected to hep gtt at 14 ml/hr. Pt A&Ox4. No skin breakdown. Oriented to room and staff. Educated on use of call bell.

## 2019-08-24 DIAGNOSIS — J9601 Acute respiratory failure with hypoxia: Secondary | ICD-10-CM

## 2019-08-24 HISTORY — DX: Morbid (severe) obesity due to excess calories: E66.01

## 2019-08-24 LAB — CBC WITH DIFFERENTIAL/PLATELET
Abs Immature Granulocytes: 0.48 10*3/uL — ABNORMAL HIGH (ref 0.00–0.07)
Basophils Absolute: 0 10*3/uL (ref 0.0–0.1)
Basophils Relative: 0 %
Eosinophils Absolute: 0 10*3/uL (ref 0.0–0.5)
Eosinophils Relative: 0 %
HCT: 37.4 % (ref 36.0–46.0)
Hemoglobin: 12.1 g/dL (ref 12.0–15.0)
Immature Granulocytes: 3 %
Lymphocytes Relative: 13 %
Lymphs Abs: 1.9 10*3/uL (ref 0.7–4.0)
MCH: 28.4 pg (ref 26.0–34.0)
MCHC: 32.4 g/dL (ref 30.0–36.0)
MCV: 87.8 fL (ref 80.0–100.0)
Monocytes Absolute: 1.4 10*3/uL — ABNORMAL HIGH (ref 0.1–1.0)
Monocytes Relative: 9 %
Neutro Abs: 10.9 10*3/uL — ABNORMAL HIGH (ref 1.7–7.7)
Neutrophils Relative %: 75 %
Platelets: 248 10*3/uL (ref 150–400)
RBC: 4.26 MIL/uL (ref 3.87–5.11)
RDW: 14.6 % (ref 11.5–15.5)
WBC: 14.6 10*3/uL — ABNORMAL HIGH (ref 4.0–10.5)
nRBC: 0.1 % (ref 0.0–0.2)

## 2019-08-24 LAB — D-DIMER, QUANTITATIVE: D-Dimer, Quant: 3.98 ug/mL-FEU — ABNORMAL HIGH (ref 0.00–0.50)

## 2019-08-24 LAB — HEPARIN LEVEL (UNFRACTIONATED): Heparin Unfractionated: 0.56 IU/mL (ref 0.30–0.70)

## 2019-08-24 LAB — SAMPLE TO BLOOD BANK

## 2019-08-24 LAB — C-REACTIVE PROTEIN: CRP: <0.5 mg/dL (ref <1.0)

## 2019-08-24 MED ORDER — TRAMADOL HCL 50 MG PO TABS
100.0000 mg | ORAL_TABLET | Freq: Four times a day (QID) | ORAL | Status: DC | PRN
  Administered 2019-08-24 – 2019-09-05 (×11): 100 mg via ORAL
  Filled 2019-08-24 (×12): qty 2

## 2019-08-24 NOTE — Progress Notes (Signed)
HRs remain in the 50's and sometimes in 40's during sleep.  No new recs at this time.  Please call us when patient is ready for discharge and we will set up outpt event monitor.

## 2019-08-24 NOTE — Progress Notes (Signed)
[place] for Heparin  Indication: pulmonary embolus  Allergies  Allergen Reactions  . Codeine Shortness Of Breath and Palpitations  . Eggs Or Egg-Derived Products Nausea And Vomiting    PROJECTILE VOMITING    Patient Measurements: Height: 5\' 7"  (170.2 cm) Weight: 300 lb (136.1 kg) IBW/kg (Calculated) : 61.6 Heparin Dosing Weight: ~95 kg  Vital Signs: Temp: 97.8 F (36.6 C) (12/22 1236) Temp Source: Oral (12/22 1236) BP: 130/70 (12/22 1236) Pulse Rate: 69 (12/22 1236)  Labs: Recent Labs    08/21/19 1842 08/21/19 1842 08/21/19 2327 08/22/19 1054 08/22/19 1715 08/23/19 0428 08/23/19 1015 08/23/19 1621 08/24/19 0800 08/24/19 1147  HGB 12.4  --   --  12.7  --  12.1  --   --  12.1  --   HCT 39.4  --   --  39.6  --  37.9  --   --  37.4  --   PLT 223  --   --  220  --  230  --   --  248  --   HEPARINUNFRC  --    < >  --  0.60 0.68 0.44  --   --   --  0.56  CREATININE 0.77  --   --  0.75  --  0.99  --   --   --   --   TROPONINIHS  --   --  4  --   --   --  4 5  --   --    < > = values in this interval not displayed.    Estimated Creatinine Clearance: 78.5 mL/min (by C-G formula based on SCr of 0.99 mg/dL).   Medical History: Past Medical History:  Diagnosis Date  . 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 Complication of anesthesia    trouble with airway  . Edema 12/22/2008  . HYPERTENSION 12/25/2007  . HYPOTHYROIDISM, POSTSURGICAL 05/04/2008  . LEG PAIN, LEFT 12/09/2008  . OSA (obstructive sleep apnea) 06/15/2013   Assessment: 68 y/o COVID+ female here with worsening cough and shortness of breath. CT Angio is positive for acute pulmonary embolism. Patient completed Xarelto therapy a few months ago for DVT in the leg. Pharmacy consulted to dose IV heparin.   Heparin level  remains at goal on 1400 units/hr. CBC is stable and WNL. No overt bleeding or infusion issues noted.  Goal of Therapy:  Heparin level 0.3-0.7 units/ml Monitor platelets by anticoagulation protocol: Yes   Plan:  Continue heparin drip at 1400 units/hr Daily heparin level and CBC Monitor for signs of bleeding  Richardine Service, PharmD PGY1 Pharmacy Resident 08/24/2019  1:04 PM

## 2019-08-24 NOTE — Progress Notes (Addendum)
[facility] PROGRESS NOTE    Progress Note  Natasha [name]  [id number] DOB: [birth year] DOA: 08/21/2019 PCP: Lucianne Lei, MD     Brief Narrative:   Natasha [name] is an 67 y.o. female past medical history significant for hypertension, hypothyroidism history of left lower extremity DVT for which she has completed 4 months course of Xarelto as an outpatient.  Presented complaining of fever cough and shortness of breath, this started 2 weeks prior to admission.  She tested positive for SARS-CoV-2 on 08/17/2019 at urgent care, she was treated empirically with Decadron as an outpatient as she was not hypoxic.  Comes back to the ED satting in the 80s, tachypneic afebrile, CT angio of the chest showed acute pulmonary embolism with moderate clot burden and bilateral multifocal infiltrates.  Started on IV heparin and transferred to [facility] on 08/23/2019  Assessment/Plan:   Acute respiratory failure with hypoxia multifactorial in the setting of pneumonia secondarily to COVID-19 and acute pulmonary embolism: CT angio of the chest on 08/21/2019 showed acute pulmonary embolism with moderate clot burden primarily in the right lung which extends to the distal segment branches with possible RV dilation by CT, mild multifocal infiltrates. 2D echo done on 08/22/2019 showed her systolic function on the left, with a low normal systolic function and mildly dilated right ventricle. Lower extremity Doppler on 08/23/2019 showed no evidence of DVT. She is requiring 2 to 3 L of oxygen nasal cannula to keep saturations greater than 95%. Her inflammatory markers are unremarkable, except for her D-dimer. Due to her significant clot burden and right ventricle showed mild dilation on echo, will continue IV heparin for at least 72 hours.  Then she can be changed to a NOAC, she has not failed anticoagulation as she was not on anticoagulation on admission.  COVID-19 pneumonia: She is symptomatic about  2 weeks prior to admission, her SARS-CoV-2 test tested positive on 08/17/2019. Continue IV remdesivir and steroids, her CRP is unremarkable. I think she is over her infection, still will finish her IV remdesivir and steroid treatment for 5 days.  Mild bradycardia: Cardiology was consulted cardiac markers were negative, episodes of bradycardia were mainly during the night, there is no evidence of AV block. They recommended an outpatient monitor event. She is on no AV nodal blocking agents.  Essential hypertension: Blood pressure borderline low she is continue on her ARB. Continue to hold diuretic therapy and calcium channel blockers.  Hypothyroidism: Continue Synthroid, her TSH was mildly low, will need to be rechecked in [redacted] weeks along with a T4 as an outpatient.  Morbid obesity with a BMI greater than 40: She has been counseled.   DVT prophylaxis: heparin Family Communication:son Dominiquie Disposition Plan/Barrier to D/C: unable to determine Code Status:     Code Status Orders  (From admission, onward)           Start     Ordered   08/22/19 0022  Full code  Continuous     08/22/19 0021           Code Status History     This patient has a current code status but no historical code status.   Advance Care Planning Activity         IV Access:   Peripheral IV   Procedures and diagnostic studies:   VAS [place] LOWER EXTREMITY VENOUS (DVT)  Result Date: 08/23/2019  Lower Venous Study Indications: D dimer.  Limitations: Body habitus, poor ultrasound/tissue interface and patient pain  tolerance. Comparison Study: 07/15/18 previous Performing Technologist: Abram Sander RVS  Examination Guidelines: A complete evaluation includes B-mode imaging, spectral Doppler, color Doppler, and power Doppler as needed of all accessible portions of each vessel. Bilateral testing is considered an integral part of a complete examination. Limited examinations for reoccurring indications  may be performed as noted.  +---------+---------------+---------+-----------+----------+--------------+ RIGHT    CompressibilityPhasicitySpontaneityPropertiesThrombus Aging +---------+---------------+---------+-----------+----------+--------------+ CFV      Full           Yes      Yes                                 +---------+---------------+---------+-----------+----------+--------------+ SFJ      Full                                                        +---------+---------------+---------+-----------+----------+--------------+ FV Prox  Full                                                        +---------+---------------+---------+-----------+----------+--------------+ FV Mid   Full                                                        +---------+---------------+---------+-----------+----------+--------------+ FV DistalFull                                                        +---------+---------------+---------+-----------+----------+--------------+ PFV      Full                                                        +---------+---------------+---------+-----------+----------+--------------+ POP      Full           Yes      Yes                                 +---------+---------------+---------+-----------+----------+--------------+ PTV      Full                                                        +---------+---------------+---------+-----------+----------+--------------+ PERO                                                  Not visualized +---------+---------------+---------+-----------+----------+--------------+   +---------+---------------+---------+-----------+----------+--------------+  LEFT     CompressibilityPhasicitySpontaneityPropertiesThrombus Aging +---------+---------------+---------+-----------+----------+--------------+ CFV      Full           Yes      Yes                                  +---------+---------------+---------+-----------+----------+--------------+ SFJ      Full                                                        +---------+---------------+---------+-----------+----------+--------------+ FV Prox  Full                                                        +---------+---------------+---------+-----------+----------+--------------+ FV Mid                  Yes      Yes                                 +---------+---------------+---------+-----------+----------+--------------+ FV Distal               Yes      Yes                                 +---------+---------------+---------+-----------+----------+--------------+ PFV      Full                                                        +---------+---------------+---------+-----------+----------+--------------+ POP      Full           Yes      Yes                                 +---------+---------------+---------+-----------+----------+--------------+ PTV      Full                                                        +---------+---------------+---------+-----------+----------+--------------+ PERO                                                  Not visualized +---------+---------------+---------+-----------+----------+--------------+     Summary: Right: There is no evidence of deep vein thrombosis in the lower extremity. No cystic structure found in the popliteal fossa. Left: There is no evidence of deep vein thrombosis in the lower extremity. No cystic structure found in the popliteal fossa.  *See table(s) above for measurements and observations. Electronically signed by Curt Jews  MD on 08/23/2019 at 5:38:22 PM.    Final    ECHOCARDIOGRAM LIMITED  Result Date: 08/22/2019   ECHOCARDIOGRAM LIMITED REPORT   Patient Name:   Natasha [name] Date of Exam: 08/22/2019 Medical Rec #:  [id number]      Height:       67.0 in Accession #:    [id number]     Weight:       300.0 lb Date of  Birth:  Nov 24, 1950       BSA:          2.40 m Patient Age:    50 years       BP:           163/101 mmHg Patient Gender: F              HR:           70 bpm. Exam Location:  Inpatient  Procedure: Limited Echo, Limited Color Doppler and Cardiac Doppler Indications:    pulmonary embolus 415.19  History:        Patient has prior history of Echocardiogram examinations, most                 recent 02/02/2008.  Sonographer:    Johny Chess Referring PhysIL:[id number] TIMOTHY S OPYD  Sonographer Comments: Image acquisition challenging due to respiratory motion. IMPRESSIONS  1. Left ventricular ejection fraction, by visual estimation, is 65 to 70%. The left ventricle has hyperdynamic function. There is no left ventricular hypertrophy.  2. The left ventricle has no regional wall motion abnormalities.  3. Global right ventricle has low normal systolic function.The right ventricular size is mildly enlarged. No increase in right ventricular wall thickness.  4. Presence of pericardial fat pad.  5. The mitral valve is grossly normal. Trivial mitral valve regurgitation.  6. The tricuspid valve is grossly normal. Tricuspid valve regurgitation is not demonstrated.  7. The tricuspid valve was normal in structure. Tricuspid valve regurgitation is not demonstrated.  8. The aortic valve is grossly normal. Aortic valve regurgitation is not visualized. No evidence of aortic valve sclerosis or stenosis.  9. The inferior vena cava is dilated in size with <50% respiratory variability, suggesting right atrial pressure of 15 mmHg. 10. Technically challenging study. Normal LV function. RV appears to be mildly enlarged, with borderline normal function. Unable to visualize PA well. 11. The interatrial septum was not assessed. FINDINGS  Left Ventricle: Left ventricular ejection fraction, by visual estimation, is 65 to 70%. The left ventricle has hyperdynamic function. The left ventricle has no regional wall motion abnormalities. There is no increased  left ventricular wall thickness. Left ventricular diastolic parameters were normal. Right Ventricle: The right ventricular size is mildly enlarged. Right vetricular wall thickness was not assessed. Global RV systolic function is has low normal systolic function. Left Atrium: Left atrial size was normal in size. Right Atrium: Right atrial size was normal in size. Right atrial pressure is estimated at 15 mmHg. Pericardium: Trivial pericardial effusion is present is seen. There is no evidence of pericardial effusion. Presence of pericardial fat pad. Mitral Valve: The mitral valve is grossly normal. MV Area by PHT, 2.69 cm. MV PHT, 81.78 msec. Trivial mitral valve regurgitation. Tricuspid Valve: The tricuspid valve is normal in structure. Tricuspid valve regurgitation is not demonstrated. Aortic Valve: The aortic valve is grossly normal. Aortic valve regurgitation is not visualized. The aortic valve is structurally normal, with no evidence of sclerosis or stenosis. Pulmonic Valve: The pulmonic valve was  not well visualized. Pulmonic valve regurgitation is not visualized by color flow Doppler. Pulmonic regurgitation is not visualized by color flow Doppler. Aorta: The aortic root and ascending aorta are structurally normal, with no evidence of dilitation. Pulmonary Artery: The pulmonary artery is not well seen. Venous: The inferior vena cava is dilated in size with less than 50% respiratory variability, suggesting right atrial pressure of 15 mmHg. Shunts: The interatrial septum was not assessed.  LEFT VENTRICLE          Normals PLAX 2D LVIDd:         4.40 cm  3.6 cm   Diastology                  Normals LVIDs:         2.40 cm  1.7 cm   LV e' lateral:   13.30 cm/s 6.42 cm/s LV PW:         1.10 cm  1.4 cm   LV E/e' lateral: 3.6        15.4 LV IVS:        1.00 cm  1.3 cm   LV e' medial:    9.79 cm/s  6.96 cm/s LVOT diam:     2.20 cm  2.0 cm   LV E/e' medial:  4.9        6.96 LV SV:         68 ml    79 ml LV SV Index:    25.86    45 ml/m2 LVOT Area:     3.80 cm 3.14 cm2  RIGHT VENTRICLE RV S prime:     17.70 cm/s TAPSE (M-mode): 3.2 cm LEFT ATRIUM         Index LA diam:    4.40 cm 1.83 cm/m  AORTIC VALVE             Normals LVOT Vmax:   86.80 cm/s LVOT Vmean:  65.400 cm/s 75 cm/s LVOT VTI:    0.207 m     25.3 cm  AORTA                 Normals Ao Root diam: 3.40 cm 31 mm Ao Asc diam:  3.50 cm 31 mm MITRAL VALVE              Normals MV Area (PHT): 2.69 cm             SHUNTS MV PHT:        81.78 msec 55 ms     Systemic VTI:  0.21 m MV Decel Time: 282 msec   187 ms    Systemic Diam: 2.20 cm MV E velocity: 48.00 cm/s 103 cm/s MV A velocity: 69.00 cm/s 70.3 cm/s MV E/A ratio:  0.70       1.5  Buford Dresser MD Electronically signed by Buford Dresser MD Signature Date/Time: 08/22/2019/2:59:16 PMThe mitral valve is grossly normal.    Final      Medical Consultants:   None.  Anti-Infectives:   IV remdesivir  Subjective:    Natasha [name] she relates her breathing is unchanged compared to yesterday.  Objective:    Vitals:   08/23/19 1851 08/23/19 2010 08/23/19 2338 08/24/19 0400  BP: (!) 123/93 (!) 135/91 136/77 (!) 131/95  Pulse: (!) 52 (!) 57 (!) 44 (!) 46  Resp: 19 (!) 23 18 19   Temp: 98.4 F (36.9 C) 98.3 F (36.8 C) 97.9 F (36.6 C) (!) 97.4 F (36.3 C)  TempSrc:  Oral Oral Oral Oral  SpO2: 96% 94% 97% 97%  Weight:      Height:       SpO2: 97 % O2 Flow Rate (L/min): 2.5 L/min   Intake/Output Summary (Last 24 hours) at 08/24/2019 0807 Last data filed at 08/24/2019 0400 Gross per 24 hour  Intake 580 ml  Output 900 ml  Net -320 ml   Filed Weights   08/21/19 1827  Weight: 136.1 kg    Exam: General exam: In no acute distress. Respiratory system: Good air movement and clear to auscultation. Cardiovascular system: S1 & S2 heard, RRR. No JVD. Gastrointestinal system: Abdomen is nondistended, soft and nontender.  Central nervous system: Alert and oriented. No focal  neurological deficits. Extremities: No pedal edema. Skin: No rashes, lesions or ulcers Psychiatry: Judgement and insight appear normal. Mood & affect appropriate.   Data Reviewed:    Labs: Basic Metabolic Panel: Recent Labs  Lab 08/21/19 1842 08/22/19 1054 08/23/19 0428  NA 140 139 140  K 4.1 3.7 3.4*  CL 109 110 107  CO2 20* 21* 23  GLUCOSE 98 138* 129*  BUN 15 11 16   CREATININE 0.77 0.75 0.99  CALCIUM 8.6* 8.7* 9.0  MG  --  1.9 2.1   GFR Estimated Creatinine Clearance: 78.5 mL/min (by C-G formula based on SCr of 0.99 mg/dL). Liver Function Tests: Recent Labs  Lab 08/21/19 1842 08/22/19 1054 08/23/19 0428  AST 32 20 13*  ALT 19 17 19   ALKPHOS 86 85 78  BILITOT 1.0 0.5 0.3  PROT 6.5 7.0 6.3*  ALBUMIN 3.3* 3.2* 3.1*   No results for input(s): LIPASE, AMYLASE in the last 168 hours. No results for input(s): AMMONIA in the last 168 hours. Coagulation profile No results for input(s): INR, PROTIME in the last 168 hours. COVID-19 Labs  Recent Labs    08/21/19 1842 08/21/19 2002 08/22/19 1054 08/23/19 0428  DDIMER 12.54*  --  13.55* 11.09*  FERRITIN  --  124 152 155  LDH 373*  --   --   --   CRP  --  0.8 0.8 0.6    No results found for: SARSCOV2NAA  CBC: Recent Labs  Lab 08/21/19 1842 08/22/19 1054 08/23/19 0428  WBC 7.3 6.7 12.5*  NEUTROABS 4.4 5.1 9.3*  HGB 12.4 12.7 12.1  HCT 39.4 39.6 37.9  MCV 90.4 88.8 88.6  PLT 223 220 230   Cardiac Enzymes: No results for input(s): CKTOTAL, CKMB, CKMBINDEX, TROPONINI in the last 168 hours. BNP (last 3 results) No results for input(s): PROBNP in the last 8760 hours. CBG: No results for input(s): GLUCAP in the last 168 hours. D-Dimer: Recent Labs    08/22/19 1054 08/23/19 0428  DDIMER 13.55* 11.09*   Hgb A1c: No results for input(s): HGBA1C in the last 72 hours. Lipid Profile: Recent Labs    08/21/19 1842  TRIG 231*   Thyroid function studies: Recent Labs    08/23/19 0904  TSH 0.080*    Anemia work up: Recent Labs    08/22/19 1054 08/23/19 0428  FERRITIN 152 155   Sepsis Labs: Recent Labs  Lab 08/21/19 1842 08/21/19 1936 08/21/19 2000 08/22/19 1054 08/23/19 0428  PROCALCITON <0.10  --   --   --   --   WBC 7.3  --   --  6.7 12.5*  LATICACIDVEN  --  1.4 1.3  --   --    Microbiology Recent Results (from the past 240 hour(s))  Urine culture  Status: Abnormal   Collection Time: 08/18/19  4:53 PM   Specimen: Urine, Random  Result Value Ref Range Status   Specimen Description URINE, RANDOM  Final   Special Requests NONE  Final   Culture (A)  Final    <10,000 COLONIES/mL INSIGNIFICANT GROWTH Performed at [facility], [address] [place], [place] [address]    Report Status 08/19/2019 FINAL  Final  Blood Culture (routine x 2)     Status: None (Preliminary result)   Collection Time: 08/21/19  8:00 PM   Specimen: BLOOD RIGHT HAND  Result Value Ref Range Status   Specimen Description BLOOD RIGHT HAND  Final   Special Requests   Final    BOTTLES DRAWN AEROBIC AND ANAEROBIC Blood Culture adequate volume   Culture   Final    NO GROWTH 3 DAYS Performed at [facility], [place] [address] [place], [place] [address]    Report Status PENDING  Incomplete  Blood Culture (routine x 2)     Status: None (Preliminary result)   Collection Time: 08/21/19  8:00 PM   Specimen: BLOOD LEFT HAND  Result Value Ref Range Status   Specimen Description BLOOD LEFT HAND  Final   Special Requests   Final    BOTTLES DRAWN AEROBIC AND ANAEROBIC Blood Culture results may not be optimal due to an inadequate volume of blood received in culture bottles   Culture   Final    NO GROWTH 3 DAYS Performed at [facility], [address] [place], [place] [address]    Report Status PENDING  Incomplete     Medications:    dexamethasone (DECADRON) injection  6 mg Intravenous Daily   irbesartan  150 mg Oral Daily   levothyroxine  150 mcg Oral QAC breakfast    sodium chloride flush  3 mL Intravenous Q12H   sodium chloride flush  3 mL Intravenous Q12H   Continuous Infusions:  sodium chloride     heparin 1,400 Units/hr (08/24/19 0355)   remdesivir 100 mg in NS 100 mL Stopped (08/23/19 1009)     LOS: 3 days   Charlynne Cousins  [facility]  08/24/2019, 8:07 AM

## 2019-08-24 NOTE — Plan of Care (Signed)
  Problem: Education: Goal: Knowledge of risk factors and measures for prevention of condition will improve Outcome: Progressing   Problem: Coping: Goal: Psychosocial and spiritual needs will be supported Outcome: Progressing   Problem: Respiratory: Goal: Will maintain a patent airway Outcome: Progressing Goal: Complications related to the disease process, condition or treatment will be avoided or minimized Outcome: Progressing   

## 2019-08-25 ENCOUNTER — Encounter (HOSPITAL_COMMUNITY): Payer: Self-pay | Admitting: Family Medicine

## 2019-08-25 DIAGNOSIS — J9601 Acute respiratory failure with hypoxia: Secondary | ICD-10-CM

## 2019-08-25 DIAGNOSIS — I10 Essential (primary) hypertension: Secondary | ICD-10-CM

## 2019-08-25 LAB — COMPREHENSIVE METABOLIC PANEL
ALT: 17 U/L (ref 0–44)
AST: 15 U/L (ref 15–41)
Albumin: 3.2 g/dL — ABNORMAL LOW (ref 3.5–5.0)
Alkaline Phosphatase: 80 U/L (ref 38–126)
Anion gap: 7 (ref 5–15)
BUN: 24 mg/dL — ABNORMAL HIGH (ref 8–23)
CO2: 23 mmol/L (ref 22–32)
Calcium: 9 mg/dL (ref 8.9–10.3)
Chloride: 108 mmol/L (ref 98–111)
Creatinine, Ser: 0.65 mg/dL (ref 0.44–1.00)
GFR calc Af Amer: >60 mL/min (ref >60)
GFR calc non Af Amer: >60 mL/min (ref >60)
Glucose, Bld: 110 mg/dL — ABNORMAL HIGH (ref 70–99)
Potassium: 4.2 mmol/L (ref 3.5–5.1)
Sodium: 138 mmol/L (ref 135–145)
Total Bilirubin: 0.6 mg/dL (ref 0.3–1.2)
Total Protein: 6.3 g/dL — ABNORMAL LOW (ref 6.5–8.1)

## 2019-08-25 LAB — CBC WITH DIFFERENTIAL/PLATELET
Abs Immature Granulocytes: 0.78 10*3/uL — ABNORMAL HIGH (ref 0.00–0.07)
Basophils Absolute: 0.1 10*3/uL (ref 0.0–0.1)
Basophils Relative: 0 %
Eosinophils Absolute: 0 10*3/uL (ref 0.0–0.5)
Eosinophils Relative: 0 %
HCT: 37.3 % (ref 36.0–46.0)
Hemoglobin: 12 g/dL (ref 12.0–15.0)
Immature Granulocytes: 5 %
Lymphocytes Relative: 13 %
Lymphs Abs: 2 10*3/uL (ref 0.7–4.0)
MCH: 28.5 pg (ref 26.0–34.0)
MCHC: 32.2 g/dL (ref 30.0–36.0)
MCV: 88.6 fL (ref 80.0–100.0)
Monocytes Absolute: 1.4 10*3/uL — ABNORMAL HIGH (ref 0.1–1.0)
Monocytes Relative: 9 %
Neutro Abs: 11.4 10*3/uL — ABNORMAL HIGH (ref 1.7–7.7)
Neutrophils Relative %: 73 %
Platelets: 280 10*3/uL (ref 150–400)
RBC: 4.21 MIL/uL (ref 3.87–5.11)
RDW: 14.7 % (ref 11.5–15.5)
WBC: 15.6 10*3/uL — ABNORMAL HIGH (ref 4.0–10.5)
nRBC: 0 % (ref 0.0–0.2)

## 2019-08-25 LAB — FERRITIN: Ferritin: 145 ng/mL (ref 11–307)

## 2019-08-25 LAB — D-DIMER, QUANTITATIVE: D-Dimer, Quant: 3.4 ug/mL-FEU — ABNORMAL HIGH (ref 0.00–0.50)

## 2019-08-25 LAB — C-REACTIVE PROTEIN: CRP: 0.6 mg/dL (ref <1.0)

## 2019-08-25 LAB — HEPARIN LEVEL (UNFRACTIONATED): Heparin Unfractionated: 0.41 IU/mL (ref 0.30–0.70)

## 2019-08-25 LAB — MAGNESIUM: Magnesium: 2.2 mg/dL (ref 1.7–2.4)

## 2019-08-25 MED ORDER — RIVAROXABAN 20 MG PO TABS: 20.0000 mg | ORAL_TABLET | Freq: Every day | ORAL | Status: DC

## 2019-08-25 MED ORDER — DEXAMETHASONE 6 MG PO TABS
6.0000 mg | ORAL_TABLET | Freq: Every day | ORAL | Status: AC
  Administered 2019-08-26 – 2019-08-30 (×5): 6 mg via ORAL
  Filled 2019-08-25 (×5): qty 1

## 2019-08-25 MED ORDER — RIVAROXABAN 15 MG PO TABS
15.0000 mg | ORAL_TABLET | Freq: Two times a day (BID) | ORAL | Status: DC
  Administered 2019-08-25 – 2019-09-06 (×26): 15 mg via ORAL
  Filled 2019-08-25 (×29): qty 1

## 2019-08-25 NOTE — Progress Notes (Signed)
[facility] for Heparin >> Xarelto Indication: pulmonary embolus  Allergies  Allergen Reactions  . Codeine Shortness Of Breath and Palpitations  . Eggs Or Egg-Derived Products Nausea And Vomiting    PROJECTILE VOMITING    Patient Measurements: Height: 5\' 7"  (170.2 cm) Weight: 300 lb (136.1 kg) IBW/kg (Calculated) : 61.6 Heparin Dosing Weight: ~95 kg  Vital Signs: Temp: 98 F (36.7 C) (12/23 0746) Temp Source: Oral (12/23 0746) BP: 150/95 (12/23 0746) Pulse Rate: 43 (12/23 0746)  Labs: Recent Labs    08/22/19 1054 08/23/19 0428 08/23/19 1015 08/23/19 1621 08/24/19 0800 08/24/19 1147 08/25/19 0410 08/25/19 0412  HGB 12.7 12.1  --   --  12.1  --   --  12.0  HCT 39.6 37.9  --   --  37.4  --   --  37.3  PLT 220 230  --   --  248  --   --  280  HEPARINUNFRC 0.60 0.44  --   --   --  0.56 0.41  --   CREATININE 0.75 0.99  --   --   --   --   --  0.65  TROPONINIHS  --   --  4 5  --   --   --   --     Estimated Creatinine Clearance: 97.1 mL/min (by C-G formula based on SCr of 0.65 mg/dL).   Medical History: Past Medical History:  Diagnosis Date  . 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 Complication of anesthesia    trouble with airway  . Edema 12/22/2008  . HYPERTENSION 12/25/2007  . HYPOTHYROIDISM, POSTSURGICAL 05/04/2008  . LEG PAIN, LEFT 12/09/2008  . OSA (obstructive sleep apnea) 06/15/2013   Assessment: 68 y/o COVID+ female here with worsening cough and shortness of breath. CT Angio is positive for acute pulmonary embolism. Patient completed Xarelto therapy a few months ago for DVT in the leg. Pharmacy consulted to transition IV Heparin to Xarelto today.  Heparin level this morning was therapeutic. Will plan to transition to Xarelto today. The patient was educated via phone  and discharge paperwork with instructions was placed in the AVS.   Goal of Therapy:  Appropriate anticoagulation for indication and hepatic/renal function  Monitor platelets by anticoagulation protocol: Yes   Plan:  - Discontinue Heparin - Start Xarelto 15 mg bid with food for 21 days (thru 1/12) followed by 20 mg once daily with supper (starting 1/13) - Pt educated, DC paperwork placed in AVS - Pharmacy will sign off and monitor peripherally  Thank you for allowing pharmacy to be a part of this patient's care.  Alycia Rossetti, PharmD, BCPS Clinical Pharmacist Clinical phone for 08/25/2019: [phone] 08/25/2019 9:20 AM   **Pharmacist phone directory can now be found on [email or web address] ([email or web address]).  Listed under [place].

## 2019-08-25 NOTE — Discharge Instructions (Addendum)
Your quarantine should continue for 21 days after your symptoms started.  Your quarantine can be discontinued on 09/08/19 as long as your symptoms have resolved without the use of fever reducing medications for 24 hours.      Person Under Monitoring Name: TIMOLIN LININGER  Location: [place] [address]   Infection Prevention Recommendations for Individuals Confirmed to have, or Being Evaluated for, 2019 Novel Coronavirus (COVID-19) Infection Who Receive Care at Home  Individuals who are confirmed to have, or are being evaluated for, COVID-19 should follow the prevention steps below until Robertson Colclough healthcare provider or local or state health department says they can return to normal activities.  Stay home except to get medical care You should restrict activities outside your home, except for getting medical care. Do not go to work, school, or public areas, and do not use public transportation or taxis.  Call ahead before visiting your doctor Before your medical appointment, call the healthcare provider and tell them that you have, or are being evaluated for, COVID-19 infection. This will help the healthcare provider's office take steps to keep other people from getting infected. Ask your healthcare provider to call the local or state health department.  Monitor your symptoms Seek prompt medical attention if your illness is worsening (e.g., difficulty breathing). Before going to your medical appointment, call the healthcare provider and tell them that you have, or are being evaluated for, COVID-19 infection. Ask your healthcare provider to call the local or state health department.  Wear Rhyan Wolters facemask You should wear Leander Tout facemask that covers your nose and mouth when you are in the same room with other people and when you visit Alben Jepsen healthcare provider. People who live with or visit you should also wear Granger Chui facemask while they are in the same room with you.  Separate yourself  from other people in your home As much as possible, you should stay in Kimberely Mccannon different room from other people in your home. Also, you should use Lajune Perine separate bathroom, if available.  Avoid sharing household items You should not share dishes, drinking glasses, cups, eating utensils, towels, bedding, or other items with other people in your home. After using these items, you should wash them thoroughly with soap and water.  Cover your coughs and sneezes Cover your mouth and nose with Donnamarie Shankles tissue when you cough or sneeze, or you can cough or sneeze into your sleeve. Throw used tissues in Vanita Cannell lined trash can, and immediately wash your hands with soap and water for at least 20 seconds or use an alcohol-based hand rub.  Wash your [facility] your hands often and thoroughly with soap and water for at least 20 seconds. You can use an alcohol-based hand sanitizer if soap and water are not available and if your hands are not visibly dirty. Avoid touching your eyes, nose, and mouth with unwashed hands.   Prevention Steps for Caregivers and Household Members of Individuals Confirmed to have, or Being Evaluated for, COVID-19 Infection Being Cared for in the Home  If you live with, or provide care at home for, Christophor Eick person confirmed to have, or being evaluated for, COVID-19 infection please follow these guidelines to prevent infection:  Follow healthcare provider's instructions Make sure that you understand and can help the patient follow any healthcare provider instructions for all care.  Provide for the patient's basic needs You should help the patient with basic needs in the home and provide support for getting groceries, prescriptions, and  other personal needs.  Monitor the patient's symptoms If they are getting sicker, call his or her medical provider and tell them that the patient has, or is being evaluated for, COVID-19 infection. This will help the healthcare provider's office take steps to keep other  people from getting infected. Ask the healthcare provider to call the local or state health department.  Limit the number of people who have contact with the patient  If possible, have only one caregiver for the patient.  Other household members should stay in another home or place of residence. If this is not possible, they should stay  in another room, or be separated from the patient as much as possible. Use Chavie Kolinski separate bathroom, if available.  Restrict visitors who do not have an essential need to be in the home.  Keep older adults, very young children, and other sick people away from the patient Keep older adults, very young children, and those who have compromised immune systems or chronic health conditions away from the patient. This includes people with chronic heart, lung, or kidney conditions, diabetes, and cancer.  Ensure good ventilation Make sure that shared spaces in the home have good air flow, such as from an air conditioner or an opened window, weather permitting.  Wash your hands often  Wash your hands often and thoroughly with soap and water for at least 20 seconds. You can use an alcohol based hand sanitizer if soap and water are not available and if your hands are not visibly dirty.  Avoid touching your eyes, nose, and mouth with unwashed hands.  Use disposable paper towels to dry your hands. If not available, use dedicated cloth towels and replace them when they become wet.  Wear Averlee Swartz facemask and gloves  Wear Laylamarie Meuser disposable facemask at all times in the room and gloves when you touch or have contact with the patient's blood, body fluids, and/or secretions or excretions, such as sweat, saliva, sputum, nasal mucus, vomit, urine, or feces.  Ensure the mask fits over your nose and mouth tightly, and do not touch it during use.  Throw out disposable facemasks and gloves after using them. Do not reuse.  Wash your hands immediately after removing your facemask and  gloves.  If your personal clothing becomes contaminated, carefully remove clothing and launder. Wash your hands after handling contaminated clothing.  Place all used disposable facemasks, gloves, and other waste in Briasia Flinders lined container before disposing them with other household waste.  Remove gloves and wash your hands immediately after handling these items.  Do not share dishes, glasses, or other household items with the patient  Avoid sharing household items. You should not share dishes, drinking glasses, cups, eating utensils, towels, bedding, or other items with Rehana Uncapher patient who is confirmed to have, or being evaluated for, COVID-19 infection.  After the person uses these items, you should wash them thoroughly with soap and water.  Wash laundry thoroughly  Immediately remove and wash clothes or bedding that have blood, body fluids, and/or secretions or excretions, such as sweat, saliva, sputum, nasal mucus, vomit, urine, or feces, on them.  Wear gloves when handling laundry from the patient.  Read and follow directions on labels of laundry or clothing items and detergent. In general, wash and dry with the warmest temperatures recommended on the label.  Clean all areas the individual has used often  Clean all touchable surfaces, such as counters, tabletops, doorknobs, bathroom fixtures, toilets, phones, keyboards, tablets, and bedside tables, every day. Also,  clean any surfaces that may have blood, body fluids, and/or secretions or excretions on them.  Wear gloves when cleaning surfaces the patient has come in contact with.  Use Braden Cimo diluted bleach solution (e.g., dilute bleach with 1 part bleach and 10 parts water) or Rey Dansby household disinfectant with Jomarion Mish label that says EPA-registered for coronaviruses. To make Meshia Rau bleach solution at home, add 1 tablespoon of bleach to 1 quart (4 cups) of water. For Nkechi Linehan larger supply, add  cup of bleach to 1 gallon (16 cups) of water.  Read labels of cleaning  products and follow recommendations provided on product labels. Labels contain instructions for safe and effective use of the cleaning product including precautions you should take when applying the product, such as wearing gloves or eye protection and making sure you have good ventilation during use of the product.  Remove gloves and wash hands immediately after cleaning.  Monitor yourself for signs and symptoms of illness Caregivers and household members are considered close contacts, should monitor their health, and will be asked to limit movement outside of the home to the extent possible. Follow the monitoring steps for close contacts listed on the symptom monitoring form.   ? If you have additional questions, contact your local health department or call the epidemiologist on call at [phone] (available 24/7). ? This guidance is subject to change. For the most up-to-date guidance from [facility], please refer to their website: YouBlogs.pl Information on my medicine - XARELTO (rivaroxaban)  This medication education was reviewed with me or my healthcare representative as part of my discharge preparation.  The pharmacist that spoke with me during my hospital stay was:  Alycia Rossetti, PharmD  WHY WAS [place]? Xarelto was prescribed to treat blood clots that may have been found in the veins of your legs (deep vein thrombosis) or in your lungs (pulmonary embolism) and to reduce the risk of them occurring again.  What do you need to know about Xarelto? The starting dose is one 15 mg tablet taken TWICE daily with food for the FIRST 21 DAYS then on January 13th, 2020 the dose is changed to one 20 mg tablet taken ONCE Evlyn Amason DAY with your evening meal.  DO NOT stop taking Xarelto without talking to the health care provider who prescribed the medication.  Refill your prescription for 20 mg tablets before you run  out.  After discharge, you should have regular check-up appointments with your healthcare provider that is prescribing your Xarelto.  In the future your dose may need to be changed if your kidney function changes by Thiago Ragsdale significant amount.  What do you do if you miss Staci Dack dose? If you are taking Xarelto TWICE DAILY and you miss Nayeliz Hipp dose, take it as soon as you remember. You may take two 15 mg tablets (total 30 mg) at the same time then resume your regularly scheduled 15 mg twice daily the next day.  If you are taking Xarelto ONCE DAILY and you miss Syana Degraffenreid dose, take it as soon as you remember on the same day then continue your regularly scheduled once daily regimen the next day. Do not take two doses of Xarelto at the same time.   Important Safety Information Xarelto is Merrell Rettinger blood thinner medicine that can cause bleeding. You should call your healthcare provider right away if you experience any of the following: ? Bleeding from an injury or your nose that does not stop. ? Unusual colored urine (red or dark brown) or  unusual colored stools (red or black). ? Unusual bruising for unknown reasons. ? Melessia Kaus serious fall or if you hit your head (even if there is no bleeding).  Some medicines may interact with Xarelto and might increase your risk of bleeding while on Xarelto. To help avoid this, consult your healthcare provider or pharmacist prior to using any new prescription or non-prescription medications, including herbals, vitamins, non-steroidal anti-inflammatory drugs (NSAIDs) and supplements.  This website has more information on Xarelto: [email or web address]

## 2019-08-25 NOTE — Progress Notes (Signed)
PROGRESS NOTE    Natasha [name]  [id number] DOB: [birth year] DOA: 08/21/2019 PCP: Lucianne Lei, MD    Brief Narrative:  Patient is 68 year old female with history of hypertension, hypothyroidism, history of left lower extremity DVT for which she was treated with Xarelto, decreased mobility with multiple back problems and a spinal disease who presented to the emergency room with fever, cough and shortness of breath for about 2 weeks.  She was originally diagnosed with COVID-19 on 08/17/2019 at urgent care and treated empirically with dexamethasone as outpatient as she was not hypoxic.  Patient came to the emergency room with saturation of 80%, tachypneic, afebrile.  CT angios showed acute pulmonary embolism with moderate clot burden and bilateral multifocal infiltrates.  Started on IV heparin and admitted to [place].   Assessment & Plan:   Principal Problem:   Acute pulmonary embolism (HCC) Active Problems:   HYPOTHYROIDISM, POSTSURGICAL   Hypertension   Pulmonary embolism (HCC)   Acute respiratory failure with hypoxia (HCC)   Obesity, Class III, BMI 40-49.9 (morbid obesity) ([place])  Acute respiratory failure with hypoxia, multifactorial in the setting of COVID-19 pneumonia and pulmonary embolism: Respiratory symptoms continue to improve.  Continue with chest physiotherapy, incentive spirometry, deep breathing exercises, sputum induction, mucolytic's and bronchodilators. Supplemental oxygen to keep saturations more than 90%.  Mobilize. Covid directed therapy with , steroids, dexamethasone day 5/10 remdesivir, day 5/5  Acute pulmonary embolism: With history of thromboembolism.  Probably aggravated by acute viral infection, however given second episode of thromboembolism and moderate clot burden, patient will likely benefit with lifelong anticoagulation.  She has been on heparin infusion for last 3 days and has remained fairly stable. Changed to Xarelto, 15 mg twice a day for  21 days and then 20 mg daily to continue.  Patient was given different choices and she agreed to stay on Xarelto.  Sinus bradycardia: Mostly at sleep.  She has sleep apnea.  Unable to use CPAP in the hospital.  Not on any AV nodal blockers.  Seen by cardiology.  They recommended outpatient follow-up and ambulatory monitor.  Cardiology to schedule a follow-up.  Hypertension: Blood pressures are stable on ARB.  Hypothyroidism: Continue Synthroid.  Recheck TSH as outpatient.  Sleep apnea: Obstructive sleep apnea.  Will use CPAP after discharge home.  Debility: Patient has multiple physical debility and aggravated by current infection.  She needs to work with PT OT today.  Will benefit with rehab depends upon PT OT evaluation.   DVT prophylaxis: Heparin, converting to Xarelto Code Status: Full code Family Communication: Patient's son, Dr. Steffanie Dunn on the phone Disposition Plan: Pending mobility with PT OT   Consultants:   None  Procedures:   None  Antimicrobials:  Antibiotics Given (last 72 hours)    Date/Time Action Medication Dose Rate   08/23/19 0858 New Bag/Given   remdesivir 100 mg in sodium chloride 0.9 % 100 mL IVPB 100 mg 200 mL/hr   08/24/19 1028 New Bag/Given   remdesivir 100 mg in sodium chloride 0.9 % 100 mL IVPB 100 mg 200 mL/hr   08/25/19 0935 New Bag/Given   remdesivir 100 mg in sodium chloride 0.9 % 100 mL IVPB 100 mg 200 mL/hr         Subjective: Patient seen and examined.  She denied any symptoms at rest.  She was able to ambulate minimum.  Denies any fever.  Trying to not use oxygen and mostly remaining more than 90%.  She has not work with  PT OT.  She is agreeable to start on oral anticoagulation. Patient and her son requesting to be seen for rehab assessment.  Objective: Vitals:   08/25/19 0400 08/25/19 0500 08/25/19 0746 08/25/19 1250  BP: (!) 149/90  (!) 150/95 128/79  Pulse: (!) 42 (!) 36 (!) 43 (!) 53  Resp: (!) 22 20 18 20   Temp: 98.2 F  (36.8 C)  98 F (36.7 C) 98 F (36.7 C)  TempSrc: Oral  Oral Oral  SpO2: 90% 93% 90% (!) 86%  Weight:      Height:        Intake/Output Summary (Last 24 hours) at 08/25/2019 1447 Last data filed at 08/25/2019 0900 Gross per 24 hour  Intake 2811.93 ml  Output 1350 ml  Net 1461.93 ml   Filed Weights   08/21/19 1827  Weight: 136.1 kg    Examination:  General exam: Appears calm and comfortable, on room air. Respiratory system: Clear to auscultation. Respiratory effort normal.  No added sounds. Cardiovascular system: S1 & S2 heard, RRR. No JVD, murmurs, rubs, gallops or clicks. No pedal edema. Gastrointestinal system: Abdomen is nondistended, soft and nontender. No organomegaly or masses felt. Normal bowel sounds heard.  Obese and pendulous. Central nervous system: Alert and oriented. No focal neurological deficits. Extremities: Symmetric 5 x 5 power. Skin: No rashes, lesions or ulcers Psychiatry: Judgement and insight appear normal. Mood & affect appropriate.     Data Reviewed: I have personally reviewed following labs and imaging studies  CBC: Recent Labs  Lab 08/21/19 1842 08/22/19 1054 08/23/19 0428 08/24/19 0800 08/25/19 0412  WBC 7.3 6.7 12.5* 14.6* 15.6*  NEUTROABS 4.4 5.1 9.3* 10.9* 11.4*  HGB 12.4 12.7 12.1 12.1 12.0  HCT 39.4 39.6 37.9 37.4 37.3  MCV 90.4 88.8 88.6 87.8 88.6  PLT 223 220 230 248 [id number]   Basic Metabolic Panel: Recent Labs  Lab 08/21/19 1842 08/22/19 1054 08/23/19 0428 08/25/19 0412  NA 140 139 140 138  K 4.1 3.7 3.4* 4.2  CL 109 110 107 108  CO2 20* 21* 23 23  GLUCOSE 98 138* 129* 110*  BUN 15 11 16  24*  CREATININE 0.77 0.75 0.99 0.65  CALCIUM 8.6* 8.7* 9.0 9.0  MG  --  1.9 2.1 2.2   GFR: Estimated Creatinine Clearance: 97.1 mL/min (by C-G formula based on SCr of 0.65 mg/dL). Liver Function Tests: Recent Labs  Lab 08/21/19 1842 08/22/19 1054 08/23/19 0428 08/25/19 0412  AST 32 20 13* 15  ALT 19 17 19 17   ALKPHOS 86 85  78 80  BILITOT 1.0 0.5 0.3 0.6  PROT 6.5 7.0 6.3* 6.3*  ALBUMIN 3.3* 3.2* 3.1* 3.2*   No results for input(s): LIPASE, AMYLASE in the last 168 hours. No results for input(s): AMMONIA in the last 168 hours. Coagulation Profile: No results for input(s): INR, PROTIME in the last 168 hours. Cardiac Enzymes: No results for input(s): CKTOTAL, CKMB, CKMBINDEX, TROPONINI in the last 168 hours. BNP (last 3 results) No results for input(s): PROBNP in the last 8760 hours. HbA1C: No results for input(s): HGBA1C in the last 72 hours. CBG: No results for input(s): GLUCAP in the last 168 hours. Lipid Profile: No results for input(s): CHOL, HDL, LDLCALC, TRIG, CHOLHDL, LDLDIRECT in the last 72 hours. Thyroid Function Tests: Recent Labs    08/23/19 0904  TSH 0.080*   Anemia Panel: Recent Labs    08/23/19 0428 08/25/19 0410  FERRITIN 155 145   Sepsis Labs: Recent Labs  Lab 08/21/19  1842 08/21/19 1936 08/21/19 2000  PROCALCITON <0.10  --   --   LATICACIDVEN  --  1.4 1.3    Recent Results (from the past 240 hour(s))  Urine culture     Status: Abnormal   Collection Time: 08/18/19  4:53 PM   Specimen: Urine, Random  Result Value Ref Range Status   Specimen Description URINE, RANDOM  Final   Special Requests NONE  Final   Culture (A)  Final    <10,000 COLONIES/mL INSIGNIFICANT GROWTH Performed at [facility] [address] [place], [place] [address]    Report Status 08/19/2019 FINAL  Final  Blood Culture (routine x 2)     Status: None (Preliminary result)   Collection Time: 08/21/19  8:00 PM   Specimen: BLOOD RIGHT HAND  Result Value Ref Range Status   Specimen Description BLOOD RIGHT HAND  Final   Special Requests   Final    BOTTLES DRAWN AEROBIC AND ANAEROBIC Blood Culture adequate volume   Culture   Final    NO GROWTH 4 DAYS Performed at [facility], [place] [address] [place], [place] [address]    Report Status PENDING  Incomplete  Blood Culture (routine x  2)     Status: None (Preliminary result)   Collection Time: 08/21/19  8:00 PM   Specimen: BLOOD LEFT HAND  Result Value Ref Range Status   Specimen Description BLOOD LEFT HAND  Final   Special Requests   Final    BOTTLES DRAWN AEROBIC AND ANAEROBIC Blood Culture results may not be optimal due to an inadequate volume of blood received in culture bottles   Culture   Final    NO GROWTH 4 DAYS Performed at [facility], [place] [address], [place], [place] [address]    Report Status PENDING  Incomplete         Radiology Studies: No results found.      Scheduled Meds: . dexamethasone (DECADRON) injection  6 mg Intravenous Daily  . irbesartan  150 mg Oral Daily  . levothyroxine  150 mcg Oral QAC breakfast  . rivaroxaban  15 mg Oral BID WC   Followed by  . [START ON 09/15/2019] rivaroxaban  20 mg Oral Q supper  . sodium chloride flush  3 mL Intravenous Q12H  . sodium chloride flush  3 mL Intravenous Q12H   Continuous Infusions: . sodium chloride       LOS: 4 days    Time spent: 25 minutes    Barb Merino, MD [facility] Pager [phone]

## 2019-08-25 NOTE — Plan of Care (Signed)
POC discussed with pt. A&O, c/o leg pain managed with prn meds. NSR/SB 40's-60's. Afebrile. On RA tolerating well. No BM this shift, Purewick in place, adequate output. Able to move self in bed. Remains on heparin gtt, therapeutic level with daily labs drawn. Will titrate per order. No other issues noted at this point.

## 2019-08-26 ENCOUNTER — Encounter (HOSPITAL_COMMUNITY): Payer: Self-pay | Admitting: Family Medicine

## 2019-08-26 LAB — CULTURE, BLOOD (ROUTINE X 2)
Culture: NO GROWTH
Culture: NO GROWTH
Special Requests: ADEQUATE

## 2019-08-26 MED ORDER — AMLODIPINE BESYLATE 5 MG PO TABS
5.0000 mg | ORAL_TABLET | Freq: Every day | ORAL | Status: DC
  Administered 2019-08-26 – 2019-08-27 (×2): 5 mg via ORAL
  Filled 2019-08-26 (×2): qty 1

## 2019-08-26 NOTE — Plan of Care (Signed)
  Problem: Education: Goal: Knowledge of risk factors and measures for prevention of condition will improve Outcome: Progressing   Problem: Coping: Goal: Psychosocial and spiritual needs will be supported Outcome: Progressing   Problem: Respiratory: Goal: Will maintain a patent airway Outcome: Progressing Goal: Complications related to the disease process, condition or treatment will be avoided or minimized Outcome: Progressing   

## 2019-08-26 NOTE — Progress Notes (Signed)
This RN and patient facetimed video-chatted with multiple family members, questions answered and updates provided. Family is pleasant and supportive. Pt and family prefer SNF upon discharege for continued PT/assitance with ADLs/safety concerns and pt currently resides alone.

## 2019-08-26 NOTE — Progress Notes (Signed)
PROGRESS NOTE    Natasha [name]  [id number] DOB: 11-30-1950 DOA: 08/21/2019 PCP: Lucianne Lei, MD    Brief Narrative:  Patient is 68 year old female with history of hypertension, hypothyroidism, history of left lower extremity DVT for which she was treated with Xarelto, decreased mobility with multiple back problems and a spinal disease who presented to the emergency room with fever, cough and shortness of breath for about 2 weeks.  She was originally diagnosed with COVID-19 on 08/17/2019 at urgent care and treated empirically with dexamethasone as outpatient as she was not hypoxic.  Patient came to the emergency room with saturation of 80%, tachypneic, afebrile.  CT angios showed acute pulmonary embolism with moderate clot burden and bilateral multifocal infiltrates.  Started on IV heparin and admitted to [facility].   Assessment & Plan:   Principal Problem:   Acute pulmonary embolism (HCC) Active Problems:   HYPOTHYROIDISM, POSTSURGICAL   Hypertension   Pulmonary embolism (HCC)   Acute respiratory failure with hypoxia (HCC)   Obesity, Class III, BMI 40-49.9 (morbid obesity) ([place])  Acute respiratory failure with hypoxia, multifactorial in the setting of COVID-19 pneumonia and pulmonary embolism: Respiratory symptoms continue to improve.  Continue with chest physiotherapy, incentive spirometry, deep breathing exercises, sputum induction, mucolytic's and bronchodilators. Supplemental oxygen to keep saturations more than 90%.  Mobilize. Covid directed therapy with , steroids, dexamethasone day 6/10 remdesivir, finished 5 days of therapy.  Acute pulmonary embolism: With history of thromboembolism.  Probably aggravated by acute viral infection, however given second episode of thromboembolism and moderate clot burden, patient will likely benefit with lifelong anticoagulation.  Initially treated with heparin infusion and now changed to Xarelto.  Tolerating.  Sinus bradycardia:  Mostly at sleep.  She has sleep apnea.  Unable to use CPAP in the hospital.  Not on any AV nodal blockers.  Seen by cardiology.  They recommended outpatient follow-up and ambulatory monitor.  Cardiology to schedule a follow-up.  Hypertension: Blood pressures are stable on ARB and amlodipine.  Hypothyroidism: Continue Synthroid.  Recheck TSH as outpatient.  Sleep apnea: Obstructive sleep apnea.  Will use CPAP after discharge home.  Debility: Patient has multiple physical debility and aggravated by current infection.  She needs to work with PT OT today.  Will benefit with rehab depends upon PT OT evaluation.   DVT prophylaxis: Xarelto Code Status: Full code Family Communication: Son. Disposition Plan: Pending mobility with PT OT   Consultants:   None  Procedures:   None  Antimicrobials:  Antibiotics Given (last 72 hours)    Date/Time Action Medication Dose Rate   08/24/19 1028 New Bag/Given   remdesivir 100 mg in sodium chloride 0.9 % 100 mL IVPB 100 mg 200 mL/hr   08/25/19 0935 New Bag/Given   remdesivir 100 mg in sodium chloride 0.9 % 100 mL IVPB 100 mg 200 mL/hr         Subjective: Seen and examined.  Mostly remained on room air.  Gets dizzy on getting up and sitting at the bedside.  Exploring rehab options.  Objective: Vitals:   08/25/19 1609 08/25/19 2015 08/26/19 0000 08/26/19 0400  BP: (!) 106/59 127/79 122/78 (!) 123/55  Pulse: (!) 58 89  (!) 50  Resp: (!) 21 20  19   Temp: 97.8 F (36.6 C) 97.8 F (36.6 C) 98.4 F (36.9 C) (!) 97.5 F (36.4 C)  TempSrc: Oral Oral Axillary Axillary  SpO2: 94% (!) 89%    Weight:      Height:  Intake/Output Summary (Last 24 hours) at 08/26/2019 1201 Last data filed at 08/25/2019 1300 Gross per 24 hour  Intake 400 ml  Output --  Net 400 ml   Filed Weights   08/21/19 1827  Weight: 136.1 kg    Examination:  General exam: Appears calm and comfortable, on room air.  Sitting at the side of the bed and eating  breakfast. Respiratory system: Clear to auscultation. Respiratory effort normal.  No added sounds. Cardiovascular system: S1 & S2 heard, RRR. No JVD, murmurs, rubs, gallops or clicks. No pedal edema. Gastrointestinal system: Abdomen is nondistended, soft and nontender. No organomegaly or masses felt. Normal bowel sounds heard.  Obese and pendulous. Central nervous system: Alert and oriented. No focal neurological deficits. Extremities: Symmetric 5 x 5 power. Skin: No rashes, lesions or ulcers Psychiatry: Judgement and insight appear normal. Mood & affect appropriate.     Data Reviewed: I have personally reviewed following labs and imaging studies  CBC: Recent Labs  Lab 08/21/19 1842 08/22/19 1054 08/23/19 0428 08/24/19 0800 08/25/19 0412  WBC 7.3 6.7 12.5* 14.6* 15.6*  NEUTROABS 4.4 5.1 9.3* 10.9* 11.4*  HGB 12.4 12.7 12.1 12.1 12.0  HCT 39.4 39.6 37.9 37.4 37.3  MCV 90.4 88.8 88.6 87.8 88.6  PLT 223 220 230 248 [id number]   Basic Metabolic Panel: Recent Labs  Lab 08/21/19 1842 08/22/19 1054 08/23/19 0428 08/25/19 0412  NA 140 139 140 138  K 4.1 3.7 3.4* 4.2  CL 109 110 107 108  CO2 20* 21* 23 23  GLUCOSE 98 138* 129* 110*  BUN 15 11 16  24*  CREATININE 0.77 0.75 0.99 0.65  CALCIUM 8.6* 8.7* 9.0 9.0  MG  --  1.9 2.1 2.2   GFR: Estimated Creatinine Clearance: 97.1 mL/min (by C-G formula based on SCr of 0.65 mg/dL). Liver Function Tests: Recent Labs  Lab 08/21/19 1842 08/22/19 1054 08/23/19 0428 08/25/19 0412  AST 32 20 13* 15  ALT 19 17 19 17   ALKPHOS 86 85 78 80  BILITOT 1.0 0.5 0.3 0.6  PROT 6.5 7.0 6.3* 6.3*  ALBUMIN 3.3* 3.2* 3.1* 3.2*   No results for input(s): LIPASE, AMYLASE in the last 168 hours. No results for input(s): AMMONIA in the last 168 hours. Coagulation Profile: No results for input(s): INR, PROTIME in the last 168 hours. Cardiac Enzymes: No results for input(s): CKTOTAL, CKMB, CKMBINDEX, TROPONINI in the last 168 hours. BNP (last 3  results) No results for input(s): PROBNP in the last 8760 hours. HbA1C: No results for input(s): HGBA1C in the last 72 hours. CBG: No results for input(s): GLUCAP in the last 168 hours. Lipid Profile: No results for input(s): CHOL, HDL, LDLCALC, TRIG, CHOLHDL, LDLDIRECT in the last 72 hours. Thyroid Function Tests: No results for input(s): TSH, T4TOTAL, FREET4, T3FREE, THYROIDAB in the last 72 hours. Anemia Panel: Recent Labs    08/25/19 0410  FERRITIN 145   Sepsis Labs: Recent Labs  Lab 08/21/19 1842 08/21/19 1936 08/21/19 2000  PROCALCITON <0.10  --   --   LATICACIDVEN  --  1.4 1.3    Recent Results (from the past 240 hour(s))  Urine culture     Status: Abnormal   Collection Time: 08/18/19  4:53 PM   Specimen: Urine, Random  Result Value Ref Range Status   Specimen Description URINE, RANDOM  Final   Special Requests NONE  Final   Culture (A)  Final    <10,000 COLONIES/mL INSIGNIFICANT GROWTH Performed at [facility], [address] [place], [place] [address]    Report Status 08/19/2019 FINAL  Final  Blood Culture (routine x 2)     Status: None   Collection Time: 08/21/19  8:00 PM   Specimen: BLOOD RIGHT HAND  Result Value Ref Range Status   Specimen Description BLOOD RIGHT HAND  Final   Special Requests   Final    BOTTLES DRAWN AEROBIC AND ANAEROBIC Blood Culture adequate volume   Culture   Final    NO GROWTH 5 DAYS Performed at [facility], [place] [address] [place], [place] [address]    Report Status 08/26/2019 FINAL  Final  Blood Culture (routine x 2)     Status: None   Collection Time: 08/21/19  8:00 PM   Specimen: BLOOD LEFT HAND  Result Value Ref Range Status   Specimen Description BLOOD LEFT HAND  Final   Special Requests   Final    BOTTLES DRAWN AEROBIC AND ANAEROBIC Blood Culture results may not be optimal due to an inadequate volume of blood received in culture bottles   Culture   Final    NO GROWTH 5 DAYS Performed at [facility], [place] [address] [place], [place] [address]    Report Status 08/26/2019 FINAL  Final         Radiology Studies: No results found.      Scheduled Meds: . amLODipine  5 mg Oral QHS  . dexamethasone  6 mg Oral Daily  . irbesartan  150 mg Oral Daily  . levothyroxine  150 mcg Oral QAC breakfast  . rivaroxaban  15 mg Oral BID WC   Followed by  . [START ON 09/15/2019] rivaroxaban  20 mg Oral Q supper  . sodium chloride flush  3 mL Intravenous Q12H  . sodium chloride flush  3 mL Intravenous Q12H   Continuous Infusions: . sodium chloride       LOS: 5 days    Time spent: 25 minutes    Barb Merino, MD [facility] Pager [phone]

## 2019-08-26 NOTE — Evaluation (Signed)
Physical Therapy Evaluation Patient Details Name: Natasha [name] MRN: [id number] DOB: 04-02-1951 Today's Date: 08/26/2019   History of Present Illness  Patient is 68 year old female with history of hypertension, hypothyroidism, history of left lower extremity DVT for which she was treated with Xarelto, decreased mobility with multiple back problems and a spinal disease who presented to the emergency room with fever, cough and shortness of breath for about 2 weeks.  She was originally diagnosed with COVID-19 on 08/17/2019 at urgent care and treated empirically with dexamethasone as outpatient as she was not hypoxic.  Patient came to the emergency room with saturation of 80%, tachypneic, afebrile.  CT angios showed acute pulmonary embolism with moderate clot burden and bilateral multifocal infiltrates  Clinical Impression  The patient  Expresses concern  About returning home alone. Patient agreeable to SNF. Patient ambulated x 40' on RA with SPO2 95%..Pt admitted with above diagnosis.  Pt currently with functional limitations due to the deficits listed below (see PT Problem List). Pt will benefit from skilled PT to increase their independence and safety with mobility to allow discharge to the venue listed below.       Follow Up Recommendations SNF;Supervision/Assistance - 24 hour    Equipment Recommendations    none   Recommendations for Other Services   OT    Precautions / Restrictions Precautions Precautions: Fall Precaution Comments: monitor Sats, adm. with PE      Mobility  Bed Mobility Overal bed mobility: Needs Assistance Bed Mobility: Rolling;Sidelying to Sit Rolling: Supervision Sidelying to sit: Min assist       General bed mobility comments: assist  with trunk, no assistance for legs to sit up on bed edge.  Transfers Overall transfer level: Needs assistance Equipment used: Rolling walker (2 wheeled) Transfers: Sit to/from [facility] Sit to Stand: Mod  assist Stand pivot transfers: Mod assist       General transfer comment: assist from bed, pivot to [facility], then  a few steps  using  SPC to recliner. Stood from recliner with min assist, pushing from recliner.  Ambulation/Gait Ambulation/Gait assistance: Min assist Gait Distance (Feet): 40 Feet Assistive device: Rolling walker (2 wheeled) Gait Pattern/deviations: Step-to pattern;Step-through pattern;Trunk flexed;Wide base of support     General Gait Details: gait steady with RW, SPO2 95% on RA.  Stairs            Wheelchair Mobility    Modified Rankin (Stroke Patients Only)       Balance Overall balance assessment: Needs assistance Sitting-balance support: No upper extremity supported;Feet supported Sitting balance-Leahy Scale: Good     Standing balance support: Single extremity supported;During functional activity Standing balance-Leahy Scale: Fair Standing balance comment: reliant  on at least 1 UE while  donning briefs and pad post voiding.                             Pertinent Vitals/Pain Pain Assessment: 0-10 Pain Score: 2  Pain Location: back and neck Pain Descriptors / Indicators: Discomfort Pain Intervention(s): Premedicated before session;Monitored during session    [place] expects to be discharged to:: Private residence Living Arrangements: Alone   Type of Home: House Home Access: Stairs to enter Entrance Stairs-Rails: Left Entrance Stairs-Number of Steps: 6 Home Layout: Able to live on main level with bedroom/bathroom Home Equipment: Walker - 4 wheels      Prior Function Level of Independence: Independent with assistive device(s)  Comments: professor of english     Hand Dominance        Extremity/Trunk Assessment   Upper Extremity Assessment Upper Extremity Assessment: Generalized weakness    Lower Extremity Assessment Lower Extremity Assessment: Generalized weakness;RLE deficits/detail;LLE  deficits/detail RLE Sensation: history of peripheral neuropathy LLE Sensation: history of peripheral neuropathy       Communication   Communication: No difficulties  Cognition Arousal/Alertness: Awake/alert Behavior During Therapy: WFL for tasks assessed/performed Overall Cognitive Status: Within Functional Limits for tasks assessed                                        General Comments      Exercises     Assessment/Plan    PT Assessment Patient needs continued PT services  PT Problem List Decreased strength;Decreased mobility;Decreased safety awareness;Decreased knowledge of precautions;Decreased activity tolerance;Decreased balance;Decreased knowledge of use of DME       PT Treatment Interventions DME instruction;Therapeutic activities;Gait training;Therapeutic exercise;Patient/family education;Functional mobility training;Stair training    PT Goals (Current goals can be found in the Care Plan section)  Acute Rehab PT Goals Patient Stated Goal: to go to rehab , PT Goal Formulation: With patient Time For Goal Achievement: 08/26/19 Potential to Achieve Goals: Good    Frequency Min 3X/week(change if going to SNF)   Barriers to discharge Decreased caregiver support      Co-evaluation               AM-PAC PT "6 Clicks" Mobility  Outcome Measure Help needed turning from your back to your side while in a flat bed without using bedrails?: A Little Help needed moving from lying on your back to sitting on the side of a flat bed without using bedrails?: A Little Help needed moving to and from a bed to a chair (including a wheelchair)?: A Lot Help needed standing up from a chair using your arms (e.g., wheelchair or bedside chair)?: A Lot Help needed to walk in hospital room?: A Lot Help needed climbing 3-5 steps with a railing? : Total 6 Click Score: 13    End of Session   Activity Tolerance: Patient tolerated treatment well Patient left: in  chair;with call bell/phone within reach Nurse Communication: Mobility status PT Visit Diagnosis: Unsteadiness on feet (R26.81);Difficulty in walking, not elsewhere classified (R26.2);Muscle weakness (generalized) (M62.81)    Time: [id number] PT Time Calculation (min) (ACUTE ONLY): 23 min   Charges:   PT Evaluation $PT Eval Moderate Complexity: 1 Mod PT Treatments $Gait Training: 8-22 mins        Tresa Endo PT Acute Rehabilitation Services Pager [phone] Office [phone]   Claretha Cooper 08/26/2019, 2:35 PM

## 2019-08-27 LAB — URINALYSIS, ROUTINE W REFLEX MICROSCOPIC
Bilirubin Urine: NEGATIVE
Glucose, UA: NEGATIVE mg/dL
Hgb urine dipstick: NEGATIVE
Ketones, ur: NEGATIVE mg/dL
Leukocytes,Ua: NEGATIVE
Nitrite: NEGATIVE
Protein, ur: NEGATIVE mg/dL
Specific Gravity, Urine: 1.019 (ref 1.005–1.030)
pH: 6 (ref 5.0–8.0)

## 2019-08-27 MED ORDER — MIRABEGRON ER 25 MG PO TB24
25.0000 mg | ORAL_TABLET | Freq: Every day | ORAL | Status: DC
  Administered 2019-08-27 – 2019-09-06 (×11): 25 mg via ORAL
  Filled 2019-08-27 (×11): qty 1

## 2019-08-27 NOTE — Progress Notes (Signed)
PROGRESS NOTE    Natasha [name]  [id number] DOB: [birth year] DOA: 08/21/2019 PCP: Lucianne Lei, MD    Brief Narrative:  Patient is 67 year old female with history of hypertension, hypothyroidism, history of left lower extremity DVT for which she was treated with Xarelto, decreased mobility with multiple back problems and a spinal disease who presented to the emergency room with fever, cough and shortness of breath for about 2 weeks.  She was originally diagnosed with COVID-19 on 08/17/2019 at urgent care and treated empirically with dexamethasone as outpatient as she was not hypoxic.  Patient came to the emergency room with saturation of 80%, tachypneic, afebrile.  CT angios showed acute pulmonary embolism with moderate clot burden and bilateral multifocal infiltrates.  Started on IV heparin and admitted to [facility].   Assessment & Plan:   Principal Problem:   Acute pulmonary embolism (HCC) Active Problems:   HYPOTHYROIDISM, POSTSURGICAL   Hypertension   Pulmonary embolism (HCC)   Acute respiratory failure with hypoxia (HCC)   Obesity, Class III, BMI 40-49.9 (morbid obesity) ([place])  Acute respiratory failure with hypoxia, multifactorial in the setting of COVID-19 pneumonia and pulmonary embolism: Respiratory symptoms improving.  Continue with chest physiotherapy, incentive spirometry, deep breathing exercises, sputum induction, mucolytic's and bronchodilators. Supplemental oxygen to keep saturations more than 90%.  Mobilize. Covid directed therapy with , steroids, dexamethasone day 7/10 remdesivir, finished 5 days of therapy.  Acute pulmonary embolism: With history of thromboembolism.  Probably aggravated by acute viral infection, however given second episode of thromboembolism and moderate clot burden, patient will likely benefit with lifelong anticoagulation.  Initially treated with heparin infusion and now changed to Xarelto.  Tolerating.  Sinus bradycardia: Mostly at  sleep.  She has sleep apnea.  Unable to use CPAP in the hospital.  Not on any AV nodal blockers.  Seen by cardiology.  They recommended outpatient follow-up and ambulatory monitor.  Cardiology to schedule a follow-up.  Hypertension: Blood pressures are stable on ARB and amlodipine.  Hypothyroidism: Continue Synthroid.  Recheck TSH as outpatient.  Sleep apnea: Obstructive sleep apnea.  Will use CPAP after discharge home.  Debility: Patient has significant physical debility and aggravated by current infection.  She will benefit with inpatient therapies at a skilled nursing rehab.   DVT prophylaxis: Xarelto Code Status: Full code Family Communication: None today. Disposition Plan: Skilled nursing rehab when bed available.   Consultants:   None  Procedures:   None  Antimicrobials:  Antibiotics Given (last 72 hours)    Date/Time Action Medication Dose Rate   08/25/19 0935 New Bag/Given   remdesivir 100 mg in sodium chloride 0.9 % 100 mL IVPB 100 mg 200 mL/hr         Subjective: Seen and examined.  Unfortunately, the thermostat not working and her room is very cold.  Mostly on room air.  No more dizziness. She is complaining of frequent urination, will check urine for analysis.  Objective: Vitals:   08/27/19 0500 08/27/19 0522 08/27/19 0605 08/27/19 0752  BP: (!) 197/113 (!) 177/92 (!) 143/76 (!) 172/82  Pulse: (!) 47 (!) 40  (!) 43  Resp: 20   18  Temp: 97.9 F (36.6 C)   97.9 F (36.6 C)  TempSrc: Oral   Oral  SpO2: 98%   99%  Weight:      Height:        Intake/Output Summary (Last 24 hours) at 08/27/2019 1329 Last data filed at 08/27/2019 0807 Gross per 24 hour  Intake  360 ml  Output 301 ml  Net 59 ml   Filed Weights   08/21/19 1827  Weight: 136.1 kg    Examination:  General exam: Appears calm and comfortable, on room air.  She was sitting at the side of the bed and eating breakfast. Respiratory system: Clear to auscultation. Respiratory effort normal.   No added sounds. Cardiovascular system: S1 & S2 heard, RRR. No JVD, murmurs, rubs, gallops or clicks. No pedal edema. Gastrointestinal system: Abdomen is nondistended, soft and nontender. No organomegaly or masses felt. Normal bowel sounds heard.  Obese and pendulous. Central nervous system: Alert and oriented. No focal neurological deficits. Extremities: Symmetric 5 x 5 power. Skin: No rashes, lesions or ulcers Psychiatry: Judgement and insight appear normal. Mood & affect appropriate.     Data Reviewed: I have personally reviewed following labs and imaging studies  CBC: Recent Labs  Lab 08/21/19 1842 08/22/19 1054 08/23/19 0428 08/24/19 0800 08/25/19 0412  WBC 7.3 6.7 12.5* 14.6* 15.6*  NEUTROABS 4.4 5.1 9.3* 10.9* 11.4*  HGB 12.4 12.7 12.1 12.1 12.0  HCT 39.4 39.6 37.9 37.4 37.3  MCV 90.4 88.8 88.6 87.8 88.6  PLT 223 220 230 248 [id number]   Basic Metabolic Panel: Recent Labs  Lab 08/21/19 1842 08/22/19 1054 08/23/19 0428 08/25/19 0412  NA 140 139 140 138  K 4.1 3.7 3.4* 4.2  CL 109 110 107 108  CO2 20* 21* 23 23  GLUCOSE 98 138* 129* 110*  BUN 15 11 16  24*  CREATININE 0.77 0.75 0.99 0.65  CALCIUM 8.6* 8.7* 9.0 9.0  MG  --  1.9 2.1 2.2   GFR: Estimated Creatinine Clearance: 97.1 mL/min (by C-G formula based on SCr of 0.65 mg/dL). Liver Function Tests: Recent Labs  Lab 08/21/19 1842 08/22/19 1054 08/23/19 0428 08/25/19 0412  AST 32 20 13* 15  ALT 19 17 19 17   ALKPHOS 86 85 78 80  BILITOT 1.0 0.5 0.3 0.6  PROT 6.5 7.0 6.3* 6.3*  ALBUMIN 3.3* 3.2* 3.1* 3.2*   No results for input(s): LIPASE, AMYLASE in the last 168 hours. No results for input(s): AMMONIA in the last 168 hours. Coagulation Profile: No results for input(s): INR, PROTIME in the last 168 hours. Cardiac Enzymes: No results for input(s): CKTOTAL, CKMB, CKMBINDEX, TROPONINI in the last 168 hours. BNP (last 3 results) No results for input(s): PROBNP in the last 8760 hours. HbA1C: No results for  input(s): HGBA1C in the last 72 hours. CBG: No results for input(s): GLUCAP in the last 168 hours. Lipid Profile: No results for input(s): CHOL, HDL, LDLCALC, TRIG, CHOLHDL, LDLDIRECT in the last 72 hours. Thyroid Function Tests: No results for input(s): TSH, T4TOTAL, FREET4, T3FREE, THYROIDAB in the last 72 hours. Anemia Panel: Recent Labs    08/25/19 0410  FERRITIN 145   Sepsis Labs: Recent Labs  Lab 08/21/19 1842 08/21/19 1936 08/21/19 2000  PROCALCITON <0.10  --   --   LATICACIDVEN  --  1.4 1.3    Recent Results (from the past 240 hour(s))  Urine culture     Status: Abnormal   Collection Time: 08/18/19  4:53 PM   Specimen: Urine, Random  Result Value Ref Range Status   Specimen Description URINE, RANDOM  Final   Special Requests NONE  Final   Culture (A)  Final    <10,000 COLONIES/mL INSIGNIFICANT GROWTH Performed at [facility], [place] [address] [place], [place] [address]    Report Status 08/19/2019 FINAL  Final  Blood  Culture (routine x 2)     Status: None   Collection Time: 08/21/19  8:00 PM   Specimen: BLOOD RIGHT HAND  Result Value Ref Range Status   Specimen Description BLOOD RIGHT HAND  Final   Special Requests   Final    BOTTLES DRAWN AEROBIC AND ANAEROBIC Blood Culture adequate volume   Culture   Final    NO GROWTH 5 DAYS Performed at [facility], [address], [place], [place] [address]    Report Status 08/26/2019 FINAL  Final  Blood Culture (routine x 2)     Status: None   Collection Time: 08/21/19  8:00 PM   Specimen: BLOOD LEFT HAND  Result Value Ref Range Status   Specimen Description BLOOD LEFT HAND  Final   Special Requests   Final    BOTTLES DRAWN AEROBIC AND ANAEROBIC Blood Culture results may not be optimal due to an inadequate volume of blood received in culture bottles   Culture   Final    NO GROWTH 5 DAYS Performed at [facility], [place] [address] [place],  [address]    Report Status 08/26/2019 FINAL   Final         Radiology Studies: No results found.      Scheduled Meds: . amLODipine  5 mg Oral QHS  . dexamethasone  6 mg Oral Daily  . irbesartan  150 mg Oral Daily  . levothyroxine  150 mcg Oral QAC breakfast  . rivaroxaban  15 mg Oral BID WC   Followed by  . [START ON 09/15/2019] rivaroxaban  20 mg Oral Q supper  . sodium chloride flush  3 mL Intravenous Q12H  . sodium chloride flush  3 mL Intravenous Q12H   Continuous Infusions: . sodium chloride       LOS: 6 days    Time spent: 25 minutes    Barb Merino, MD [facility] Pager [phone]

## 2019-08-27 NOTE — Plan of Care (Signed)
  Problem: Education: Goal: Knowledge of risk factors and measures for prevention of condition will improve Outcome: Progressing   Problem: Coping: Goal: Psychosocial and spiritual needs will be supported Outcome: Progressing   Problem: Respiratory: Goal: Will maintain a patent airway Outcome: Progressing Note: Room air Goal: Complications related to the disease process, condition or treatment will be avoided or minimized Outcome: Progressing

## 2019-08-27 NOTE — Plan of Care (Signed)
  Problem: Education: Goal: Knowledge of risk factors and measures for prevention of condition will improve Outcome: Progressing   Problem: Coping: Goal: Psychosocial and spiritual needs will be supported Outcome: Progressing   Problem: Respiratory: Goal: Will maintain a patent airway Outcome: Progressing Goal: Complications related to the disease process, condition or treatment will be avoided or minimized Outcome: Progressing   

## 2019-08-27 NOTE — Progress Notes (Signed)
   08/27/19 0542  Provider Notification  Provider Name/Title Durene Romans   Date Provider Notified 08/27/19  Time Provider Notified [phone]  Notification Type Page  Notification Reason Other (Comment) (bradycardia, hypertension)  ongoing bradycardia with HR 40s while awake this am. Pt is asymptomatic and cardiology previously consulted for bradycardia. Pt now with HTN, SBP 170s-190s.

## 2019-08-28 LAB — TROPONIN I (HIGH SENSITIVITY): Troponin I (High Sensitivity): <2 ng/L (ref <18)

## 2019-08-28 MED ORDER — AMLODIPINE BESYLATE 10 MG PO TABS
10.0000 mg | ORAL_TABLET | Freq: Every day | ORAL | Status: DC
  Administered 2019-08-28 – 2019-09-01 (×5): 10 mg via ORAL
  Filled 2019-08-28 (×5): qty 1

## 2019-08-28 MED ORDER — IRBESARTAN 300 MG PO TABS
300.0000 mg | ORAL_TABLET | Freq: Every day | ORAL | Status: DC
  Administered 2019-08-28 – 2019-09-01 (×5): 300 mg via ORAL
  Filled 2019-08-28 (×5): qty 1

## 2019-08-28 NOTE — [place] Notes (Addendum)
   08/28/19 2055  Provider Notification  Provider Name/Title Durene Romans   Date Provider Notified 08/28/19  Time Provider Notified 2055  Notification Type Page  Notification Reason Other (Comment) (chest pain)  Response See new orders  Date of Provider Response 08/28/19  Time of Provider Response 2055  pt c/o chest "soreness and pressure like someone is punching me", 4/10 mid chest while lying in bed. No acute distress noted. VS obtained. 2L supplemental 02 applied. MD notified of ongoing bradycardia, cardiology previously consulted. Pt declining prn pain medication at this time, will continue to monitor. Pt educated to notify staff if change/increase in pain/shortness of breath/dizziness. New orders received.

## 2019-08-28 NOTE — [place] Notes (Signed)
   08/28/19 2355  Provider Notification  Provider Name/Title Durene Romans   Date Provider Notified 08/28/19  Time Provider Notified 2356  Notification Type Page  Notification Reason Other (Comment) (trop <2.00, ekg with sinus bradycardia )  first troponin is negative at <2.00. EKG unable to scan into patient's electronic chart, sinus bradycardia noted. Pt no longer complaining of chest pain, sitting at bedside eating a snack at this time. MD updated, no new orders at this time. Will continue to monitor.

## 2019-08-28 NOTE — [place] Notes (Signed)
PROGRESS NOTE    Natasha [name]  [id number] DOB: 03/18/1951 DOA: 08/21/2019 PCP: Lucianne Lei, MD    Brief Narrative:  Patient is 68 year old female with history of hypertension, hypothyroidism, history of left lower extremity DVT for which she was treated with Xarelto, decreased mobility with multiple back problems and a spinal disease who presented to the emergency room with fever, cough and shortness of breath for about 2 weeks.  She was originally diagnosed with COVID-19 on 08/17/2019 at urgent care and treated empirically with dexamethasone as outpatient as she was not hypoxic.  Patient came to the emergency room with saturation of 80%, tachypneic, afebrile.  CT angios showed acute pulmonary embolism with moderate clot burden and bilateral multifocal infiltrates.  Started on IV heparin and admitted to [facility].   Assessment & Plan:   Principal Problem:   Acute pulmonary embolism (HCC) Active Problems:   HYPOTHYROIDISM, POSTSURGICAL   Hypertension   Pulmonary embolism (HCC)   Acute respiratory failure with hypoxia (HCC)   Obesity, Class III, BMI 40-49.9 (morbid obesity) ([place])  Acute respiratory failure with hypoxia, multifactorial in the setting of COVID-19 pneumonia and pulmonary embolism: Respiratory symptoms improving.  Continue with chest physiotherapy, incentive spirometry, deep breathing exercises, sputum induction, mucolytic's and bronchodilators. Supplemental oxygen to keep saturations more than 90%.  Mobilize. Covid directed therapy with , steroids, dexamethasone day 8/10 remdesivir, finished 5 days of therapy.  Acute pulmonary embolism: With history of thromboembolism.  Probably aggravated by acute viral infection, however given second episode of thromboembolism and moderate clot burden, patient will likely benefit with lifelong anticoagulation.  Initially treated with heparin infusion and now changed to Xarelto.  Tolerating.  Sinus bradycardia: Mostly at  sleep.  She has sleep apnea.  Unable to use CPAP in the hospital.  Not on any AV nodal blockers.  Seen by cardiology.  They recommended outpatient follow-up and ambulatory monitor.  Cardiology to schedule a follow-up.  Hypertension: Blood pressures are stable on ARB and amlodipine.  Hypothyroidism: Continue Synthroid.  Recheck TSH as outpatient.  Sleep apnea: Obstructive sleep apnea.  Will use CPAP after discharge home.  Debility: Patient has significant physical debility and aggravated by current infection.  She will benefit with inpatient therapies at a skilled nursing rehab.   DVT prophylaxis: Xarelto Code Status: Full code Family Communication: None today. Disposition Plan: Skilled nursing rehab when bed available. She will continue to work with nursing staff for ambulation and mobility.   Consultants:   None  Procedures:   None  Antimicrobials:  Antibiotics Given (last 72 hours)    None         Subjective: Seen and examined.  No overnight events.  She still has some dizziness when she is getting up.  No orthostatic changes.  Objective: Vitals:   08/27/19 1548 08/27/19 2028 08/28/19 0518 08/28/19 0751  BP: (!) 143/107 (!) 144/95 (!) 161/93 (!) 145/74  Pulse: (!) 58 (!) 55 (!) 50 (!) 41  Resp:  20 20   Temp: 97.6 F (36.4 C) 98.7 F (37.1 C) 97.7 F (36.5 C) 97.9 F (36.6 C)  TempSrc: Oral Oral Oral Oral  SpO2: 98% 96% 99% 100%  Weight:      Height:        Intake/Output Summary (Last 24 hours) at 08/28/2019 1404 Last data filed at 08/28/2019 0500 Gross per 24 hour  Intake 360 ml  Output 2 ml  Net 358 ml   Filed Weights   08/21/19 1827  Weight: 136.1  kg    Examination:  Physical Exam  Constitutional: She is oriented to person, place, and time.  Looks comfortable.  Sleeping in the bed.  HENT:  Head: Normocephalic.  Eyes: Pupils are equal, round, and reactive to light.  Cardiovascular: Normal rate and regular rhythm.  Pulmonary/Chest:  No  added sounds.  On room air.  Abdominal: Bowel sounds are normal.  Musculoskeletal:     Cervical back: Neck supple.  Neurological: She is alert and oriented to person, place, and time.       Data Reviewed: I have personally reviewed following labs and imaging studies  CBC: Recent Labs  Lab 08/21/19 1842 08/22/19 1054 08/23/19 0428 08/24/19 0800 08/25/19 0412  WBC 7.3 6.7 12.5* 14.6* 15.6*  NEUTROABS 4.4 5.1 9.3* 10.9* 11.4*  HGB 12.4 12.7 12.1 12.1 12.0  HCT 39.4 39.6 37.9 37.4 37.3  MCV 90.4 88.8 88.6 87.8 88.6  PLT 223 220 230 248 [id number]   Basic Metabolic Panel: Recent Labs  Lab 08/21/19 1842 08/22/19 1054 08/23/19 0428 08/25/19 0412  NA 140 139 140 138  K 4.1 3.7 3.4* 4.2  CL 109 110 107 108  CO2 20* 21* 23 23  GLUCOSE 98 138* 129* 110*  BUN 15 11 16  24*  CREATININE 0.77 0.75 0.99 0.65  CALCIUM 8.6* 8.7* 9.0 9.0  MG  --  1.9 2.1 2.2   GFR: Estimated Creatinine Clearance: 97.1 mL/min (by C-G formula based on SCr of 0.65 mg/dL). Liver Function Tests: Recent Labs  Lab 08/21/19 1842 08/22/19 1054 08/23/19 0428 08/25/19 0412  AST 32 20 13* 15  ALT 19 17 19 17   ALKPHOS 86 85 78 80  BILITOT 1.0 0.5 0.3 0.6  PROT 6.5 7.0 6.3* 6.3*  ALBUMIN 3.3* 3.2* 3.1* 3.2*   No results for input(s): LIPASE, AMYLASE in the last 168 hours. No results for input(s): AMMONIA in the last 168 hours. Coagulation Profile: No results for input(s): INR, PROTIME in the last 168 hours. Cardiac Enzymes: No results for input(s): CKTOTAL, CKMB, CKMBINDEX, TROPONINI in the last 168 hours. BNP (last 3 results) No results for input(s): PROBNP in the last 8760 hours. HbA1C: No results for input(s): HGBA1C in the last 72 hours. CBG: No results for input(s): GLUCAP in the last 168 hours. Lipid Profile: No results for input(s): CHOL, HDL, LDLCALC, TRIG, CHOLHDL, LDLDIRECT in the last 72 hours. Thyroid Function Tests: No results for input(s): TSH, T4TOTAL, FREET4, T3FREE, THYROIDAB in the  last 72 hours. Anemia Panel: No results for input(s): VITAMINB12, FOLATE, FERRITIN, TIBC, IRON, RETICCTPCT in the last 72 hours. Sepsis Labs: Recent Labs  Lab 08/21/19 1842 08/21/19 1936 08/21/19 2000  PROCALCITON <0.10  --   --   LATICACIDVEN  --  1.4 1.3    Recent Results (from the past 240 hour(s))  Urine culture     Status: Abnormal   Collection Time: 08/18/19  4:53 PM   Specimen: Urine, Random  Result Value Ref Range Status   Specimen Description URINE, RANDOM  Final   Special Requests NONE  Final   Culture (A)  Final    <10,000 COLONIES/mL INSIGNIFICANT GROWTH Performed at [facility], [place] [address], [place], [place] [address]    Report Status 08/19/2019 FINAL  Final  Blood Culture (routine x 2)     Status: None   Collection Time: 08/21/19  8:00 PM   Specimen: BLOOD RIGHT HAND  Result Value Ref Range Status   Specimen Description BLOOD RIGHT HAND  Final  Special Requests   Final    BOTTLES DRAWN AEROBIC AND ANAEROBIC Blood Culture adequate volume   Culture   Final    NO GROWTH 5 DAYS Performed at [facility], [place] [address] [place], [place] [address]    Report Status 08/26/2019 FINAL  Final  Blood Culture (routine x 2)     Status: None   Collection Time: 08/21/19  8:00 PM   Specimen: BLOOD LEFT HAND  Result Value Ref Range Status   Specimen Description BLOOD LEFT HAND  Final   Special Requests   Final    BOTTLES DRAWN AEROBIC AND ANAEROBIC Blood Culture results may not be optimal due to an inadequate volume of blood received in culture bottles   Culture   Final    NO GROWTH 5 DAYS Performed at [facility], [place] [address], [place], [place] [address]    Report Status 08/26/2019 FINAL  Final         Radiology Studies: No results found.      Scheduled Meds: . amLODipine  10 mg Oral Daily  . dexamethasone  6 mg Oral Daily  . irbesartan  300 mg Oral Daily  . levothyroxine  150 mcg Oral QAC breakfast  . mirabegron ER  25 mg  Oral Daily  . rivaroxaban  15 mg Oral BID WC   Followed by  . [START ON 09/15/2019] rivaroxaban  20 mg Oral Q supper  . sodium chloride flush  3 mL Intravenous Q12H  . sodium chloride flush  3 mL Intravenous Q12H   Continuous Infusions: . sodium chloride       LOS: 7 days    Time spent: 25 minutes    Barb Merino, MD [facility] Pager [phone]

## 2019-08-28 NOTE — TOC Initial Note (Signed)
Transition of Care [facility]) - Initial/Assessment Note    Patient Details  Name: Natasha [name] MRN: [id number] Date of Birth: July 28, 1951  Transition of Care [facility]) CM/SW Contact:    Boneta Lucks, RN Phone Number: 08/28/2019, 3:48 PM  Clinical Narrative:    CM consult and PT recommendation for SNF. Discussed with Patient and Daughter.  Choices given, they prefer [facility]. FL2 done and sent out, Faxed to [facility], sent to [facility] in [employer].  Family will continue to discuss her care and wait to see what bed offers she gets.  They may plan to take her home with Home Health.  TOC to follow. RN updated.               Expected Discharge Plan: Skilled Nursing Facility Barriers to Discharge: Continued Medical Work up   Patient Goals and CMS Choice Patient states their goals for this hospitalization and ongoing recovery are:: to go to SNF and return home. CMS Medicare.gov Compare Post Acute Care list provided to:: Patient Choice offered to / list presented to : Patient  Expected Discharge Plan and Services Expected Discharge Plan: [place]     Prior Living Arrangements/Services   Lives with:: Self Patient language and need for interpreter reviewed:: Yes        Need for Family Participation in Patient Care: Yes (Comment) Care giver support system in place?: Yes (comment) Current home services: DME Criminal Activity/Legal Involvement Pertinent to Current Situation/Hospitalization: No - Comment as needed  Activities of Daily Living Home Assistive Devices/Equipment: Other (Comment) ADL Screening (condition at time of admission) Patient's cognitive ability adequate to safely complete daily activities?: Yes Is the patient deaf or have difficulty hearing?: No Does the patient have difficulty seeing, even when wearing glasses/contacts?: No Does the patient have difficulty concentrating, remembering, or making decisions?: No Patient able to express  need for assistance with ADLs?: Yes Does the patient have difficulty dressing or bathing?: No Independently performs ADLs?: No Communication: Independent Dressing (OT): Needs assistance Is this a change from baseline?: Pre-admission baseline Grooming: Independent Feeding: Independent Bathing: Needs assistance Is this a change from baseline?: Pre-admission baseline Toileting: Independent In/Out Bed: Needs assistance Is this a change from baseline?: Pre-admission baseline Walks in Home: Independent Does the patient have difficulty walking or climbing stairs?: No Weakness of Legs: None Weakness of Arms/Hands: None  Permission Sought/Granted      Share Information with NAME: Smiley Houseman     Permission granted to share info w Relationship: Daughter     Emotional Assessment       Orientation: : Oriented to Self, Oriented to Place, Oriented to  Time, Oriented to Situation Alcohol / Substance Use: Not Applicable Psych Involvement: No (comment)  Admission diagnosis:  Pulmonary embolism (HCC) [I26.99] Acute pulmonary embolism ([place]) [I26.99] Acute pulmonary embolism without acute cor pulmonale, unspecified pulmonary embolism type ([place]) [I26.99] COVID-19 virus infection [U07.1] Patient Active Problem List   Diagnosis Date Noted  . Acute respiratory failure with hypoxia ([place]) 08/24/2019  . Obesity, Class III, BMI 40-49.9 (morbid obesity) ([place]) 08/24/2019  . Pulmonary embolism ([place]) 08/23/2019  . COVID-19 virus infection   . Bradycardia   . Acute pulmonary embolism ([place]) 08/21/2019  . Pneumonia due to COVID-19 virus   . OSA (obstructive sleep apnea) 06/15/2013  . ALLERGIC RHINITIS 01/21/2009  . EDEMA 12/22/2008  . LEG PAIN, LEFT 12/09/2008  . DISTURBANCE OF SKIN SENSATION 12/05/2008  . NAUSEA AND VOMITING 05/23/2008  . CARCINOMA, THYROID GLAND,  PAPILLARY 05/04/2008  . HYPOTHYROIDISM, POSTSURGICAL 05/04/2008  . Hypertension 12/25/2007  . DYSPNEA 12/25/2007  . COUGH  12/25/2007   PCP:  Lucianne Lei, MD Pharmacy:   [id number] - [place], [address] [place] [address] Phone: [phone] Fax: [phone]   Readmission Risk Interventions No flowsheet data found.

## 2019-08-28 NOTE — Plan of Care (Signed)
  Problem: Education: Goal: Knowledge of risk factors and measures for prevention of condition will improve Outcome: Progressing   Problem: Coping: Goal: Psychosocial and spiritual needs will be supported Outcome: Progressing   Problem: Respiratory: Goal: Will maintain a patent airway Outcome: Progressing Goal: Complications related to the disease process, condition or treatment will be avoided or minimized Outcome: Progressing   

## 2019-08-28 NOTE — [place] FL2 (Signed)
[place] LEVEL OF CARE SCREENING TOOL     IDENTIFICATION  Patient Name: Natasha [name] Birthdate: 07-12-1951 Sex: female Admission Date (Current Location): 08/21/2019  [facility] and [place] Number:  Herbalist and Address:  [facility], [place] [address] [place], [place] [address]([place])      Provider Number: [id number]  Attending Physician Name and Address:  Barb Merino, MD  Relative Name and Phone Number:  Daughter  Kayren Eaves [phone]    Current Level of Care: Hospital Recommended Level of Care: [place] Prior Approval Number:    Date Approved/Denied:   PASRR Number: [id number]  Discharge Plan: SNF    Current Diagnoses: Patient Active Problem List   Diagnosis Date Noted  . Acute respiratory failure with hypoxia ([place]) 08/24/2019  . Obesity, Class III, BMI 40-49.9 (morbid obesity) ([place]) 08/24/2019  . Pulmonary embolism ([place]) 08/23/2019  . COVID-19 virus infection   . Bradycardia   . Acute pulmonary embolism ([place]) 08/21/2019  . Pneumonia due to COVID-19 virus   . OSA (obstructive sleep apnea) 06/15/2013  . ALLERGIC RHINITIS 01/21/2009  . EDEMA 12/22/2008  . LEG PAIN, LEFT 12/09/2008  . DISTURBANCE OF SKIN SENSATION 12/05/2008  . NAUSEA AND VOMITING 05/23/2008  . CARCINOMA, THYROID GLAND, PAPILLARY 05/04/2008  . HYPOTHYROIDISM, POSTSURGICAL 05/04/2008  . Hypertension 12/25/2007  . DYSPNEA 12/25/2007  . COUGH 12/25/2007    Orientation RESPIRATION BLADDER Height & Weight     Self, Time, Situation, Place  Normal External catheter Weight: 136.1 kg Height:  5\' 7"  (170.2 cm)  BEHAVIORAL SYMPTOMS/MOOD NEUROLOGICAL BOWEL NUTRITION STATUS      Continent Diet(see Discharge Summary)  AMBULATORY STATUS COMMUNICATION OF NEEDS Skin   Limited Assist Verbally Normal                       Personal Care Assistance Level of Assistance  Bathing, Dressing, Feeding Bathing Assistance: Limited  assistance Feeding assistance: Independent Dressing Assistance: Limited assistance     Functional Limitations Info  Sight, Speech, Hearing Sight Info: Impaired Hearing Info: Adequate Speech Info: Adequate    SPECIAL CARE FACTORS FREQUENCY  PT (By licensed PT)     PT Frequency: 5 times a week              Contractures Contractures Info: Not present    Additional Factors Info  Code Status, Allergies Code Status Info: FULL Allergies Info: Codiene  eggs           Current Medications (08/28/2019):  This is the current hospital active medication list Current Facility-Administered Medications  Medication Dose Route Frequency Provider Last Rate Last Admin  . 0.9 %  sodium chloride infusion  250 mL Intravenous PRN Opyd, Ilene Qua, MD      . acetaminophen (TYLENOL) tablet 650 mg  650 mg Oral Q6H PRN Opyd, Ilene Qua, MD   650 mg at 08/26/19 1351   Or  . acetaminophen (TYLENOL) suppository 650 mg  650 mg Rectal Q6H PRN Opyd, Ilene Qua, MD      . albuterol (VENTOLIN HFA) 108 (90 Base) MCG/ACT inhaler 4 puff  4 puff Inhalation Q4H PRN Opyd, Ilene Qua, MD      . amLODipine (NORVASC) tablet 10 mg  10 mg Oral Daily Barb Merino, MD   10 mg at 08/28/19 0837  . dexamethasone (DECADRON) tablet 6 mg  6 mg Oral Daily Barb Merino, MD   6 mg at 08/28/19 0837  .  fentaNYL (SUBLIMAZE) injection 25-50 mcg  25-50 mcg Intravenous Q2H PRN Opyd, Ilene Qua, MD      . guaiFENesin-dextromethorphan (ROBITUSSIN DM) 100-10 MG/5ML syrup 10 mL  10 mL Oral Q4H PRN Opyd, Ilene Qua, MD      . irbesartan (AVAPRO) tablet 300 mg  300 mg Oral Daily Barb Merino, MD   300 mg at 08/28/19 0837  . levothyroxine (SYNTHROID) tablet 150 mcg  150 mcg Oral QAC breakfast Regalado, Belkys A, MD   150 mcg at 08/28/19 0521  . mirabegron ER (MYRBETRIQ) tablet 25 mg  25 mg Oral Daily Barb Merino, MD   25 mg at 08/28/19 0837  . ondansetron (ZOFRAN) tablet 4 mg  4 mg Oral Q6H PRN Opyd, Ilene Qua, MD       Or  .  ondansetron (ZOFRAN) injection 4 mg  4 mg Intravenous Q6H PRN Opyd, Ilene Qua, MD      . Rivaroxaban (XARELTO) tablet 15 mg  15 mg Oral BID WC Rolla Flatten, RPH   15 mg at 08/28/19 [id number]   Followed by  . [START ON 09/15/2019] rivaroxaban (XARELTO) tablet 20 mg  20 mg Oral Q supper Rolla Flatten, [facility]      . senna-docusate (Senokot-S) tablet 1 tablet  1 tablet Oral QHS PRN Opyd, Ilene Qua, MD      . sodium chloride flush (NS) 0.9 % injection 3 mL  3 mL Intravenous Q12H Opyd, Ilene Qua, MD   3 mL at 08/28/19 0838  . sodium chloride flush (NS) 0.9 % injection 3 mL  3 mL Intravenous Q12H Opyd, Ilene Qua, MD   3 mL at 08/28/19 0838  . sodium chloride flush (NS) 0.9 % injection 3 mL  3 mL Intravenous PRN Opyd, Ilene Qua, MD      . traMADol Veatrice Bourbon) tablet 100 mg  100 mg Oral Q6H PRN Charlynne Cousins, MD   100 mg at 08/27/19 2055  . zolpidem (AMBIEN) tablet 5 mg  5 mg Oral QHS PRN Opyd, Ilene Qua, MD         Discharge Medications: Please see discharge summary for a list of discharge medications.  Relevant Imaging Results:  Relevant Lab Results:   Additional Information SS# [id number]  Boneta Lucks, RN

## 2019-08-28 NOTE — [place] Notes (Signed)
  Nursing Home Choices -  Discussed with Patient and Daughter.   [place] [facility] [place], [place] [address] [phone] Overall rating Much below average 2. 1.4 mi [place] at the [place], [place] [address] [phone] Overall rating Below average 3. 2 mi [facility] [place], [place] [address] [phone] Overall rating Much above average 4. 2.3 mi [place], [place] [address] [phone] Overall rating Below average 5. 2.6 [facility] [address] [place], [place] [address] [phone] Overall rating Below average 6. 3.1 mi [place] at [place], [place] [address] [phone] Overall rating Below average 7. 3.2 mi [place] [facility] [place] [address] [place], [place] [address] [phone] Overall rating Much above average 8. 3.6 mi [place] [address] [place], [place] [address] [phone] Overall rating Much below average 9. 3.7 mi [facility] [place], [place] [address] [phone] Overall rating Much below average 10. 5.4 [place], [place] [address] [phone] Overall rating Below average 11. 5.6 mi [facility] [place], [place] [address] [phone] Overall rating Much above average 12. 6.1 mi [facility] [address] [place], [place] [address] [phone] Overall rating Much above average 13. 7.1 [place] and [place], [place] [address] [phone] Overall rating Much below average 14. 7.2 mi [facility] [address] [place], [place] [address] [phone] Overall rating Average 15. 7.9 [place], [place] [address] [phone] Overall rating Above average <Previous 1 2 3

## 2019-08-28 NOTE — [place] Notes (Signed)
Natasha [name], Natasha [name] [id number] (CSN: [id number]) (68 y.o. F) (Adm: 08/21/19) [id number]-[id number] PCP  BLAND, VEITA Demographics Comment   Last edited by  on at   Address: Home Phone: Work Phone: Mobile Phone:  [address] [place] [address]   [phone] -- [phone]  SSN: Insurance: Marital Status: Religion:  [id number] [place]  Patient Ethnicity & Race  Ethnic Group Patient Race  Not Hispanic or Latino Black or African American  Documents Filed to Patient  Power of Attorney Living Will Clinical Unknown Study Attachment Consent Form ABN Waiver After Visit Summary Lab Result Scan Code Status MyChart Status Advance Care Planning  Not on File Not on File Not on File Not on File Filed Not on [place] [Updated on 08/22/19 0021] Pending Jump to the Activity  Admission Information  Current Information  Attending Provider Admitting Provider Admission Type Admission Status  Barb Merino, MD Vianne Bulls, MD Emergency Admission (Confirmed)       Admission Date/Time Discharge Date Hospital Service Auth/Cert Status  [id number] 05:44 PM  [place] Unit Room/Bed   [place] CGV-THIRD FLOOR 9313/9313-01        Hospital Account  Name Acct ID Class Status Primary Coverage  Veva, Malek [id number] Inpatient Open [place] HMO      Guarantor Account (for Hospital Account [id number])  Name Relation to [place]? Acct Type  [name] Self CHSA Yes Personal/Family  Address Phone    [address]  [place], [place] [address] [phone])  [phone])        Coverage Information (for Hospital Account [id number])  1. [facility] MEDICARE HMO  F/O Payor/Plan Precert #  [facility] [place] MEDICARE HMO   Subscriber Subscriber #  Jannel, Remund [id number]  Address Phone  [address] [place]  [address] [phone]  2. BLUE CROSS BLUE SHIELD/BCBS STATE HEALTH PPO  F/O Payor/Plan Precert #  BLUE CROSS [facility] PPO   Subscriber Subscriber #  [place], Paluch [id number]  Address Phone  [address] [place],  [address] [phone]       Care Everywhere ID:  [id number]

## 2019-08-28 NOTE — Plan of Care (Signed)
  Problem: Education: Goal: Knowledge of risk factors and measures for prevention of condition will improve 08/28/2019 0339 by Ellison Hughs, RN Outcome: Adequate for Discharge 08/28/2019 [phone] by Ellison Hughs, RN Outcome: Progressing   Problem: Coping: Goal: Psychosocial and spiritual needs will be supported 08/28/2019 [id number] by Ellison Hughs, RN Outcome: Adequate for Discharge 08/28/2019 0339 by Ellison Hughs, RN Outcome: Progressing   Problem: Respiratory: Goal: Will maintain a patent airway 08/28/2019 0339 by Ellison Hughs, RN Outcome: Adequate for Discharge 08/28/2019 0339 by Ellison Hughs, RN Outcome: Progressing Goal: Complications related to the disease process, condition or treatment will be avoided or minimized Outcome: Adequate for Discharge

## 2019-08-28 NOTE — Evaluation (Addendum)
Occupational Therapy Evaluation Patient Details Name: Natasha [name] MRN: [id number] DOB: 1951-05-03 Today's Date: 08/28/2019    History of Present Illness Patient is 68 year old female with history of hypertension, hypothyroidism, history of left lower extremity DVT for which she was treated with Xarelto, decreased mobility with multiple back problems and a spinal disease who presented to the emergency room with fever, cough and shortness of breath for about 2 weeks.  She was originally diagnosed with COVID-19 on 08/17/2019 at urgent care and treated empirically with dexamethasone as outpatient as she was not hypoxic.  Patient came to the emergency room with saturation of 80%, tachypneic, afebrile.  CT angios showed acute pulmonary embolism with moderate clot burden and bilateral multifocal infiltrates   Clinical Impression   This 68 y/o female presents with the above. PTA pt reports mod independence with ADL and functional mobility using SPC/RW for mobility, lives alone. Pt presenting with weakness, decreased standing balance and endurance impacting her functional performance. Pt requiring minA for functional mobility within room using RW. Pt with one instance of foot catching/dragging during mobility with pt able to correct with minA provided. Pt overall requiring minA for toileting and LB ADL, setup/minguard assist for seated UB ADL. Pt with good motivation to return to her PLOF. She will benefit from continued acute OT services and recommend follow up therapy services in SNF setting after discharge to progress pt towards her PLOF. Pt in agreement for ST rehab. Will follow.     Follow Up Recommendations  SNF;Supervision/Assistance - 24 hour    Equipment Recommendations  Other (comment)(defer to next venue)           Precautions / Restrictions Precautions Precautions: Fall Precaution Comments: monitor Sats, adm. with PE Restrictions Weight Bearing Restrictions: No      Mobility Bed  Mobility               General bed mobility comments: seated EOB upon arrival  Transfers Overall transfer level: Needs assistance Equipment used: Rolling walker (2 wheeled) Transfers: Sit to/from Stand Sit to Stand: Min assist;Min guard         General transfer comment: able to perform sit<>stand from EOB with minguard assist, increased boosting assist from surface height of toilet; performed x3 sit<>stand during session    Balance Overall balance assessment: Needs assistance Sitting-balance support: No upper extremity supported;Feet supported Sitting balance-Leahy Scale: Good     Standing balance support: Single extremity supported;Bilateral upper extremity supported;During functional activity Standing balance-Leahy Scale: Fair Standing balance comment: reliant on at least 1 UE support for stability                            ADL either performed or assessed with clinical judgement   ADL Overall ADL's : Needs assistance/impaired Eating/Feeding: Modified independent;Sitting   Grooming: Set up;Sitting   Upper Body Bathing: Set up;Min guard;Sitting   Lower Body Bathing: Minimal assistance;Sit to/from stand   Upper Body Dressing : Set up;Min guard;Sitting   Lower Body Dressing: Minimal assistance;Sit to/from stand Lower Body Dressing Details (indicate cue type and reason): donning mesh underwear, assist for standing balance and to advance underwear over hips Toilet Transfer: Minimal assistance;Ambulation;RW;Grab bars;Regular Toilet   Toileting- Clothing Manipulation and Hygiene: Minimal assistance;Sit to/from stand;Sitting/lateral lean Toileting - Clothing Manipulation Details (indicate cue type and reason): assist for gown management, pt performing pericare via lateral leans after voiding bladder     Functional mobility during ADLs: Minimal assistance;Rolling walker  General ADL Comments: pt with weakness, decreased activity tolerance     Vision          Perception     Praxis      Pertinent Vitals/Pain Pain Assessment: Faces Faces Pain Scale: Hurts a little bit Pain Location: LEs Pain Descriptors / Indicators: Discomfort;Sore Pain Intervention(s): Limited activity within patient's tolerance;Monitored during session;Repositioned     Hand Dominance     Extremity/Trunk Assessment Upper Extremity Assessment Upper Extremity Assessment: Generalized weakness   Lower Extremity Assessment Lower Extremity Assessment: Defer to PT evaluation       Communication Communication Communication: No difficulties   Cognition Arousal/Alertness: Awake/alert Behavior During Therapy: WFL for tasks assessed/performed Overall Cognitive Status: Within Functional Limits for tasks assessed                                     General Comments       Exercises     Shoulder Instructions      Home Living Family/patient expects to be discharged to:: Private residence Living Arrangements: Alone   Type of Home: House Home Access: Stairs to enter Technical brewer of Steps: 6 Entrance Stairs-Rails: Left Home Layout: Able to live on main level with bedroom/bathroom     Bathroom Shower/Tub: Tub only;Walk-in shower   Bathroom Toilet: Standard     Home Equipment: Environmental consultant - 4 wheels;Shower seat;Bedside commode;Walker - 2 wheels          Prior Functioning/Environment Level of Independence: Independent with assistive device(s)        Comments: professor of english, mostly using SPC, occasionally RW for mobility        OT Problem List: Decreased strength;Decreased activity tolerance;Impaired balance (sitting and/or standing);Decreased knowledge of use of DME or AE;Obesity;Cardiopulmonary status limiting activity      OT Treatment/Interventions: Self-care/ADL training;Therapeutic exercise;Energy conservation;DME and/or AE instruction;Therapeutic activities;Patient/family education;Balance training    OT  Goals(Current goals can be found in the care plan section) Acute Rehab OT Goals Patient Stated Goal: pt agreeable to rehab  OT Goal Formulation: With patient Time For Goal Achievement: 09/11/19 Potential to Achieve Goals: Good  OT Frequency: Min 2X/week   Barriers to D/C:            Co-evaluation              AM-PAC OT "6 Clicks" Daily Activity     Outcome Measure Help from another person eating meals?: None Help from another person taking care of personal grooming?: None Help from another person toileting, which includes using toliet, bedpan, or urinal?: A Little Help from another person bathing (including washing, rinsing, drying)?: A Little Help from another person to put on and taking off regular upper body clothing?: None Help from another person to put on and taking off regular lower body clothing?: A Little 6 Click Score: 21   End of Session Equipment Utilized During Treatment: Rolling walker Nurse Communication: Mobility status  Activity Tolerance: Patient tolerated treatment well Patient left: in chair;with call bell/phone within reach  OT Visit Diagnosis: Unsteadiness on feet (R26.81);Muscle weakness (generalized) (M62.81)                Time: [id number] OT Time Calculation (min): 25 min Charges:  OT General Charges $OT Visit: 1 Visit OT Evaluation $OT Eval Moderate Complexity: 1 Mod OT Treatments $Self Care/Home Management : 8-22 mins  Lou Cal, OT [facility] Pager [phone] Office  [phone]  Raymondo Band 08/28/2019, 4:50 PM

## 2019-08-28 NOTE — Plan of Care (Signed)
  Problem: Education: Goal: Knowledge of risk factors and measures for prevention of condition will improve Outcome: Adequate for Discharge   Problem: Coping: Goal: Psychosocial and spiritual needs will be supported Outcome: Adequate for Discharge   Problem: Respiratory: Goal: Will maintain a patent airway Outcome: Adequate for Discharge

## 2019-08-29 LAB — TROPONIN I (HIGH SENSITIVITY): Troponin I (High Sensitivity): 2 ng/L

## 2019-08-29 NOTE — Progress Notes (Signed)
PROGRESS NOTE    Natasha [name]  [id number] DOB: 14-Dec-1950 DOA: 08/21/2019 PCP: Lucianne Lei, MD    Brief Narrative:  Patient is 68 year old female with history of hypertension, hypothyroidism, history of left lower extremity DVT for which she was treated with Xarelto, decreased mobility with multiple back problems and a spinal disease who presented to the emergency room with fever, cough and shortness of breath for about 2 weeks.  She was originally diagnosed with COVID-19 on 08/17/2019 at urgent care and treated empirically with dexamethasone as outpatient as she was not hypoxic.  Patient came to the emergency room with saturation of 80%, tachypneic, afebrile.  CT angios showed acute pulmonary embolism with moderate clot burden and bilateral multifocal infiltrates.  Started on IV heparin and admitted to [place].   Assessment & Plan:   Principal Problem:   Acute pulmonary embolism (HCC) Active Problems:   HYPOTHYROIDISM, POSTSURGICAL   Hypertension   Pulmonary embolism (HCC)   Acute respiratory failure with hypoxia (HCC)   Obesity, Class III, BMI 40-49.9 (morbid obesity) ([place])  Acute respiratory failure with hypoxia, multifactorial in the setting of COVID-19 pneumonia and pulmonary embolism: Respiratory symptoms improving.  Continue with chest physiotherapy, incentive spirometry, deep breathing exercises, sputum induction, mucolytic's and bronchodilators. Supplemental oxygen to keep saturations more than 90%.  Mobilize. Covid directed therapy with , steroids, dexamethasone day 9/10 remdesivir, finished 5 days of therapy.  Acute pulmonary embolism: With history of thromboembolism.  Probably aggravated by acute viral infection, however given second episode of thromboembolism and moderate clot burden, patient will likely benefit with lifelong anticoagulation.  Initially treated with heparin infusion and now changed to Xarelto.  Tolerating.  Sinus bradycardia: Mostly at  sleep.  She has sleep apnea.  Unable to use CPAP in the hospital.  Not on any AV nodal blockers.  Seen by cardiology.  They recommended outpatient follow-up and ambulatory monitor.  Cardiology to schedule a follow-up.  Hypertension: Blood pressures are stable on ARB and amlodipine.  Hypothyroidism: Continue Synthroid.  Recheck TSH as outpatient.  Sleep apnea: Obstructive sleep apnea.  Will use CPAP after discharge home.  Debility: Patient has significant physical debility and aggravated by current infection.  She will benefit with inpatient therapies at a skilled nursing rehab.   DVT prophylaxis: Xarelto Code Status: Full code Family Communication: None today. Disposition Plan: Skilled nursing rehab when bed available. She will continue to work with nursing staff for ambulation and mobility.   Consultants:   None  Procedures:   None  Antimicrobials:  Antibiotics Given (last 72 hours)    None         Subjective: Seen and examined.  Episodic dizziness still persist.  Overnight events noted, she had some chest pain prompted troponin check and EKG checks they were nonischemic.  She has costochondral palpable pain. Patient also complained of left thigh pain, normal on examination, improved with tramadol.  Objective: Vitals:   08/28/19 2035 08/28/19 2049 08/29/19 0451 08/29/19 0739  BP: 126/73  (!) 156/81 (!) 169/103  Pulse: (!) 55  (!) 51 (!) 53  Resp: 20  (!) 22   Temp: 97.6 F (36.4 C)  97.7 F (36.5 C) 98 F (36.7 C)  TempSrc: Oral  Oral Oral  SpO2: 97% 98% 100% 99%  Weight:      Height:        Intake/Output Summary (Last 24 hours) at 08/29/2019 1330 Last data filed at 08/29/2019 0540 Gross per 24 hour  Intake 240 ml  Output --  Net 240 ml   Filed Weights   08/21/19 1827  Weight: 136.1 kg    Examination:  Physical Exam  Constitutional: She is oriented to person, place, and time.  Sitting at the edge of the bed.  Not in any distress.  On room air.   HENT:  Head: Normocephalic.  Eyes: Pupils are equal, round, and reactive to light.  Cardiovascular: Normal rate and regular rhythm.  Pulmonary/Chest:  No added sounds.  On room air.  Abdominal: Bowel sounds are normal.  Musculoskeletal:     Cervical back: Neck supple.  Neurological: She is alert and oriented to person, place, and time.  Examination of the leg and thighs with no focal deficits, no swelling or edema.  No localized tenderness.    Data Reviewed: I have personally reviewed following labs and imaging studies  CBC: Recent Labs  Lab 08/23/19 0428 08/24/19 0800 08/25/19 0412  WBC 12.5* 14.6* 15.6*  NEUTROABS 9.3* 10.9* 11.4*  HGB 12.1 12.1 12.0  HCT 37.9 37.4 37.3  MCV 88.6 87.8 88.6  PLT 230 248 [id number]   Basic Metabolic Panel: Recent Labs  Lab 08/23/19 0428 08/25/19 0412  NA 140 138  K 3.4* 4.2  CL 107 108  CO2 23 23  GLUCOSE 129* 110*  BUN 16 24*  CREATININE 0.99 0.65  CALCIUM 9.0 9.0  MG 2.1 2.2   GFR: Estimated Creatinine Clearance: 97.1 mL/min (by C-G formula based on SCr of 0.65 mg/dL). Liver Function Tests: Recent Labs  Lab 08/23/19 0428 08/25/19 0412  AST 13* 15  ALT 19 17  ALKPHOS 78 80  BILITOT 0.3 0.6  PROT 6.3* 6.3*  ALBUMIN 3.1* 3.2*   No results for input(s): LIPASE, AMYLASE in the last 168 hours. No results for input(s): AMMONIA in the last 168 hours. Coagulation Profile: No results for input(s): INR, PROTIME in the last 168 hours. Cardiac Enzymes: No results for input(s): CKTOTAL, CKMB, CKMBINDEX, TROPONINI in the last 168 hours. BNP (last 3 results) No results for input(s): PROBNP in the last 8760 hours. HbA1C: No results for input(s): HGBA1C in the last 72 hours. CBG: No results for input(s): GLUCAP in the last 168 hours. Lipid Profile: No results for input(s): CHOL, HDL, LDLCALC, TRIG, CHOLHDL, LDLDIRECT in the last 72 hours. Thyroid Function Tests: No results for input(s): TSH, T4TOTAL, FREET4, T3FREE, THYROIDAB in  the last 72 hours. Anemia Panel: No results for input(s): VITAMINB12, FOLATE, FERRITIN, TIBC, IRON, RETICCTPCT in the last 72 hours. Sepsis Labs: No results for input(s): PROCALCITON, LATICACIDVEN in the last 168 hours.  Recent Results (from the past 240 hour(s))  Blood Culture (routine x 2)     Status: None   Collection Time: 08/21/19  8:00 PM   Specimen: BLOOD RIGHT HAND  Result Value Ref Range Status   Specimen Description BLOOD RIGHT HAND  Final   Special Requests   Final    BOTTLES DRAWN AEROBIC AND ANAEROBIC Blood Culture adequate volume   Culture   Final    NO GROWTH 5 DAYS Performed at [facility], [address] [place], [place] [address]    Report Status 08/26/2019 FINAL  Final  Blood Culture (routine x 2)     Status: None   Collection Time: 08/21/19  8:00 PM   Specimen: BLOOD LEFT HAND  Result Value Ref Range Status   Specimen Description BLOOD LEFT HAND  Final   Special Requests   Final    BOTTLES DRAWN AEROBIC AND ANAEROBIC  Blood Culture results may not be optimal due to an inadequate volume of blood received in culture bottles   Culture   Final    NO GROWTH 5 DAYS Performed at [facility] [address], [place], [place] [address]    Report Status 08/26/2019 FINAL  Final         Radiology Studies: No results found.      Scheduled Meds: . amLODipine  10 mg Oral Daily  . dexamethasone  6 mg Oral Daily  . irbesartan  300 mg Oral Daily  . levothyroxine  150 mcg Oral QAC breakfast  . mirabegron ER  25 mg Oral Daily  . rivaroxaban  15 mg Oral BID WC   Followed by  . [START ON 09/15/2019] rivaroxaban  20 mg Oral Q supper  . sodium chloride flush  3 mL Intravenous Q12H  . sodium chloride flush  3 mL Intravenous Q12H   Continuous Infusions: . sodium chloride       LOS: 8 days    Time spent: 25 minutes    Barb Merino, MD [facility] Pager [phone]

## 2019-08-29 NOTE — Plan of Care (Signed)
  Problem: Education: Goal: Knowledge of risk factors and measures for prevention of condition will improve Outcome: Progressing   Problem: Coping: Goal: Psychosocial and spiritual needs will be supported Outcome: Progressing   Problem: Respiratory: Goal: Will maintain a patent airway Outcome: Progressing Goal: Complications related to the disease process, condition or treatment will be avoided or minimized Outcome: Progressing   

## 2019-08-30 LAB — CBC
HCT: 38.8 % (ref 36.0–46.0)
Hemoglobin: 12.5 g/dL (ref 12.0–15.0)
MCH: 28.5 pg (ref 26.0–34.0)
MCHC: 32.2 g/dL (ref 30.0–36.0)
MCV: 88.4 fL (ref 80.0–100.0)
Platelets: 303 10*3/uL (ref 150–400)
RBC: 4.39 MIL/uL (ref 3.87–5.11)
RDW: 15.6 % — ABNORMAL HIGH (ref 11.5–15.5)
WBC: 17 10*3/uL — ABNORMAL HIGH (ref 4.0–10.5)
nRBC: 0 % (ref 0.0–0.2)

## 2019-08-30 NOTE — Progress Notes (Signed)
   08/30/19 1200  Family/Significant Other Communication  Family/Significant Other Update Other (Comment) (Patient declined. Patient updating family.)

## 2019-08-30 NOTE — Progress Notes (Signed)
PROGRESS NOTE    Natasha [name]  [id number] DOB: June 29, 1951 DOA: 08/21/2019 PCP: Lucianne Lei, MD    Brief Narrative:  Patient is 68 year old female with history of hypertension, hypothyroidism, history of left lower extremity DVT for which she was treated with Xarelto, decreased mobility with multiple back problems and spinal disease who presented to the emergency room with fever, cough and shortness of breath for about 2 weeks.  She was originally diagnosed with COVID-19 on 08/17/2019 at urgent care and treated empirically with dexamethasone as outpatient as she was not hypoxic.  Patient came to the emergency room with saturation of 80%, tachypneic, afebrile.  CT angios showed acute pulmonary embolism with moderate clot burden and bilateral multifocal infiltrates.  Started on IV heparin and admitted to [place].   Assessment & Plan:   Principal Problem:   Acute pulmonary embolism (HCC) Active Problems:   HYPOTHYROIDISM, POSTSURGICAL   Hypertension   Pulmonary embolism (HCC)   Acute respiratory failure with hypoxia (HCC)   Obesity, Class III, BMI 40-49.9 (morbid obesity) ([place])  Acute respiratory failure with hypoxia, multifactorial in the setting of COVID-19 pneumonia and pulmonary embolism: Respiratory symptoms improving.  Continue with chest physiotherapy, incentive spirometry, deep breathing exercises, sputum induction, mucolytic's and bronchodilators. Supplemental oxygen to keep saturations more than 90%.  Mobilize. Covid directed therapy with , steroids, dexamethasone day 10/10 remdesivir, finished 5 days of therapy.  Acute pulmonary embolism: With history of thromboembolism.  Probably aggravated by acute viral infection, however given second episode of thromboembolism and moderate clot burden, patient will likely benefit with lifelong anticoagulation. Initially treated with heparin infusion and now changed to Xarelto.   Sinus bradycardia: Mostly at sleep.  She has  sleep apnea.  Unable to use CPAP in the hospital.  Not on any AV nodal blockers.  Seen by cardiology.  They recommended outpatient follow-up and ambulatory monitor.  Cardiology to schedule a follow-up.  Hypertension: Blood pressures are stable on ARB and amlodipine. Dose increased.  Hypothyroidism: Continue Synthroid.  Recheck TSH as outpatient.  Sleep apnea: Obstructive sleep apnea.  Will use CPAP after discharge home.  Debility: Patient has significant physical debility and aggravated by current infection.  She will benefit with inpatient therapies at a skilled nursing rehab.   DVT prophylaxis: Xarelto Code Status: Full code Family Communication: None today. Disposition Plan: Skilled nursing rehab when bed available. She will continue to work with nursing staff for ambulation and mobility.   Consultants:   None  Procedures:   None  Antimicrobials:  Antibiotics Given (last 72 hours)    None         Subjective: No overnight events. Still has some anterior chest wall pain on deep breathing. Thigh pain improves with tramadol. Afebrile. On room air.   Objective: Vitals:   08/29/19 1609 08/29/19 2040 08/30/19 0415 08/30/19 0835  BP: 137/87 (!) 147/99 (!) 158/109 (!) 151/103  Pulse: (!) 52 60 (!) 51 60  Resp: 16 20 20 18   Temp: 98.1 F (36.7 C) 98.4 F (36.9 C) 98.1 F (36.7 C) 98.7 F (37.1 C)  TempSrc: Oral Oral Oral Oral  SpO2: 96% 97% 99% 97%  Weight:      Height:        Intake/Output Summary (Last 24 hours) at 08/30/2019 1203 Last data filed at 08/30/2019 0836 Gross per 24 hour  Intake 123 ml  Output 2 ml  Net 121 ml   Filed Weights   08/21/19 1827  Weight: 136.1 kg  Examination:  Physical Exam  Constitutional: She is oriented to person, place, and time.  Sitting at the edge of the bed.  Not in any distress.  On room air.  HENT:  Head: Normocephalic.  Eyes: Pupils are equal, round, and reactive to light.  Cardiovascular: Normal rate and  regular rhythm.  Pulmonary/Chest:  No added sounds.  On room air.  Abdominal: Bowel sounds are normal.  Musculoskeletal:     Cervical back: Neck supple.  Neurological: She is alert and oriented to person, place, and time.  Examination of the leg and thighs with no focal deficits, no swelling or edema.  No localized tenderness.    Data Reviewed: I have personally reviewed following labs and imaging studies  CBC: Recent Labs  Lab 08/24/19 0800 08/25/19 0412 08/30/19 0650  WBC 14.6* 15.6* 17.0*  NEUTROABS 10.9* 11.4*  --   HGB 12.1 12.0 12.5  HCT 37.4 37.3 38.8  MCV 87.8 88.6 88.4  PLT 248 280 [id number]   Basic Metabolic Panel: Recent Labs  Lab 08/25/19 0412  NA 138  K 4.2  CL 108  CO2 23  GLUCOSE 110*  BUN 24*  CREATININE 0.65  CALCIUM 9.0  MG 2.2   GFR: Estimated Creatinine Clearance: 97.1 mL/min (by C-G formula based on SCr of 0.65 mg/dL). Liver Function Tests: Recent Labs  Lab 08/25/19 0412  AST 15  ALT 17  ALKPHOS 80  BILITOT 0.6  PROT 6.3*  ALBUMIN 3.2*   No results for input(s): LIPASE, AMYLASE in the last 168 hours. No results for input(s): AMMONIA in the last 168 hours. Coagulation Profile: No results for input(s): INR, PROTIME in the last 168 hours. Cardiac Enzymes: No results for input(s): CKTOTAL, CKMB, CKMBINDEX, TROPONINI in the last 168 hours. BNP (last 3 results) No results for input(s): PROBNP in the last 8760 hours. HbA1C: No results for input(s): HGBA1C in the last 72 hours. CBG: No results for input(s): GLUCAP in the last 168 hours. Lipid Profile: No results for input(s): CHOL, HDL, LDLCALC, TRIG, CHOLHDL, LDLDIRECT in the last 72 hours. Thyroid Function Tests: No results for input(s): TSH, T4TOTAL, FREET4, T3FREE, THYROIDAB in the last 72 hours. Anemia Panel: No results for input(s): VITAMINB12, FOLATE, FERRITIN, TIBC, IRON, RETICCTPCT in the last 72 hours. Sepsis Labs: No results for input(s): PROCALCITON, LATICACIDVEN in the last  168 hours.  Recent Results (from the past 240 hour(s))  Blood Culture (routine x 2)     Status: None   Collection Time: 08/21/19  8:00 PM   Specimen: BLOOD RIGHT HAND  Result Value Ref Range Status   Specimen Description BLOOD RIGHT HAND  Final   Special Requests   Final    BOTTLES DRAWN AEROBIC AND ANAEROBIC Blood Culture adequate volume   Culture   Final    NO GROWTH 5 DAYS Performed at [facility], [address] [place], [place] [address]    Report Status 08/26/2019 FINAL  Final  Blood Culture (routine x 2)     Status: None   Collection Time: 08/21/19  8:00 PM   Specimen: BLOOD LEFT HAND  Result Value Ref Range Status   Specimen Description BLOOD LEFT HAND  Final   Special Requests   Final    BOTTLES DRAWN AEROBIC AND ANAEROBIC Blood Culture results may not be optimal due to an inadequate volume of blood received in culture bottles   Culture   Final    NO GROWTH 5 DAYS Performed at [facility], [address] [place], [place] [address]    Report Status 08/26/2019 FINAL  Final         Radiology Studies: No results found.      Scheduled Meds: . amLODipine  10 mg Oral Daily  . irbesartan  300 mg Oral Daily  . levothyroxine  150 mcg Oral QAC breakfast  . mirabegron ER  25 mg Oral Daily  . rivaroxaban  15 mg Oral BID WC   Followed by  . [START ON 09/15/2019] rivaroxaban  20 mg Oral Q supper  . sodium chloride flush  3 mL Intravenous Q12H  . sodium chloride flush  3 mL Intravenous Q12H   Continuous Infusions: . sodium chloride       LOS: 9 days    Time spent: 25 minutes    Barb Merino, MD [facility] Pager [phone]

## 2019-08-30 NOTE — Progress Notes (Signed)
Physical Therapy Treatment Patient Details Name: Natasha [name] MRN: [id number] DOB: 11-Mar-1951 Today's Date: 08/30/2019    History of Present Illness Patient is 68 year old female with history of hypertension, hypothyroidism, history of left lower extremity DVT for which she was treated with Xarelto, decreased mobility with multiple back problems and a spinal disease who presented to the emergency room with fever, cough and shortness of breath for about 2 weeks.  She was originally diagnosed with COVID-19 on 08/17/2019 at urgent care and treated empirically with dexamethasone as outpatient as she was not hypoxic.  Patient came to the emergency room with saturation of 80%, tachypneic, afebrile.  CT angios showed acute pulmonary embolism with moderate clot burden and bilateral multifocal infiltrates    PT Comments     The patient  Is ambulating on RA x 50' with  4 stop and prop rest breaks. SPO2 100% on RA. The patient is slowly progressing, continues to be deconditioned. Patient agreeable to post acute rehab.  Follow Up Recommendations  SNF;Supervision/Assistance - 24 hour     Equipment Recommendations  None recommended by PT    Recommendations for Other Services       Precautions / Restrictions Precautions Precautions: Fall Precaution Comments: monitor Sats, adm. with PE Restrictions Weight Bearing Restrictions: No    Mobility  Bed Mobility               General bed mobility comments: in recliner  Transfers Overall transfer level: Needs assistance Equipment used: Rolling walker (2 wheeled) Transfers: Sit to/from Stand Sit to Stand: Min assist         General transfer comment: able to perform sit<>stand from reclinerwith  min guard assist, performed 3 times.  Ambulation/Gait Ambulation/Gait assistance: Min assist Gait Distance (Feet): 50 Feet Assistive device: Rolling walker (2 wheeled) Gait Pattern/deviations: Step-to pattern;Step-through pattern;Trunk  flexed;Wide base of support Gait velocity: decr   General Gait Details: patient stopped to lean on RW x4 during the walking distance. Relates to back and fatigueSPO2 100% on RA   Stairs             Wheelchair Mobility    Modified Rankin (Stroke Patients Only)       Balance                                            Cognition Arousal/Alertness: Awake/alert                                            Exercises Other Exercises Other Exercises: TB for UE's Other Exercises: seated marching and LAQs x 5    General Comments        Pertinent Vitals/Pain Faces Pain Scale: Hurts even more Pain Location: back and shoulders Pain Descriptors / Indicators: Discomfort;Sore Pain Intervention(s): Monitored during session    Home Living                      Prior Function            PT Goals (current goals can now be found in the care plan section) Progress towards PT goals: Progressing toward goals    Frequency    Min 2X/week      PT Plan Current plan remains appropriate;Frequency needs to  be updated    Co-evaluation              AM-PAC PT "6 Clicks" Mobility   Outcome Measure  Help needed turning from your back to your side while in a flat bed without using bedrails?: A Little Help needed moving from lying on your back to sitting on the side of a flat bed without using bedrails?: A Little Help needed moving to and from a bed to a chair (including a wheelchair)?: A Little Help needed standing up from a chair using your arms (e.g., wheelchair or bedside chair)?: A Little Help needed to walk in hospital room?: A Lot Help needed climbing 3-5 steps with a railing? : Total 6 Click Score: 15    End of Session   Activity Tolerance: Patient limited by fatigue Patient left: in chair;with call bell/phone within reach Nurse Communication: Mobility status PT Visit Diagnosis: Unsteadiness on feet (R26.81);Difficulty  in walking, not elsewhere classified (R26.2);Muscle weakness (generalized) (M62.81)     Time: [id number] PT Time Calculation (min) (ACUTE ONLY): 31 min  Charges:  $Gait Training: 23-37 mins                     [place] Pager [phone] Office [phone]    Claretha Cooper 08/30/2019, 10:25 AM

## 2019-08-30 NOTE — TOC Progression Note (Signed)
Transition of Care [facility]) - Progression Note    Patient Details  Name: Natasha [name] MRN: [id number] Date of Birth: 1950-09-13  [facility]) CM/SW Contact  Joaquin Courts, RN Phone Number: 08/30/2019, 3:58 PM  Clinical Narrative:    CM spoke with patient over the telephone and provided bed offers. CM called and followed up with [facility], admissions rep reports facility is not taking any admissions at this time. CM informed patient of this. Patient was uncertain about accepting her bed offer at this time and wanted to see if other options were available. CM went over all the SNF that are accepting Covid patients at this time and provided CMS ratings. Patient expressed interest in [facility].  CM faxed out FL2 to [facility] and will await response.     Expected Discharge Plan: Skilled Nursing Facility Barriers to Discharge: Continued Medical Work up  Expected Discharge Plan and Services Expected Discharge Plan: [place]                                               Social Determinants of Health (SDOH) Interventions    Readmission Risk Interventions No flowsheet data found.

## 2019-08-30 NOTE — Plan of Care (Signed)
  Problem: Education: Goal: Knowledge of risk factors and measures for prevention of condition will improve Outcome: Adequate for Discharge   Problem: Coping: Goal: Psychosocial and spiritual needs will be supported Outcome: Adequate for Discharge   Problem: Respiratory: Goal: Will maintain a patent airway Outcome: Adequate for Discharge

## 2019-08-31 NOTE — Plan of Care (Signed)
  Problem: Education: Goal: Knowledge of risk factors and measures for prevention of condition will improve Outcome: Progressing   Problem: Coping: Goal: Psychosocial and spiritual needs will be supported Outcome: Progressing   Problem: Respiratory: Goal: Will maintain a patent airway Outcome: Progressing Goal: Complications related to the disease process, condition or treatment will be avoided or minimized Outcome: Progressing   

## 2019-08-31 NOTE — Progress Notes (Signed)
   08/31/19 1235  Family/Significant Other Communication  Family/Significant Other Update Other (Comment) (Patient declined)

## 2019-08-31 NOTE — TOC Progression Note (Addendum)
Transition of Care [facility]) - Progression Note    Patient Details  Name: Natasha [name] MRN: [id number] Date of Birth: November 15, 1950  Transition of Care [facility]) CM/SW Contact  Joaquin Courts, RN Phone Number: 08/31/2019, 12:12 PM  Clinical Narrative:    CM followed up with patient with additional bed offers. Patient accepts bed offer from [facility] in [place].  Rep notified of selection. Additionally, rep notified that patient's [facility] Medicare is not managed by [facility] and will need Auth, facility to initiate.  CM will continue to follow to coordinate dc.    Expected Discharge Plan: Skilled Nursing Facility Barriers to Discharge: Continued Medical Work up  Expected Discharge Plan and Services Expected Discharge Plan: [place]                                               Social Determinants of Health (SDOH) Interventions    Readmission Risk Interventions No flowsheet data found.

## 2019-08-31 NOTE — Progress Notes (Signed)
PROGRESS NOTE    Natasha [name]  [id number] DOB: Jul 28, 1951 DOA: 08/21/2019 PCP: Lucianne Lei, MD    Brief Narrative:  Patient is 68 year old female with history of hypertension, hypothyroidism, history of left lower extremity DVT for which she was treated with Xarelto, decreased mobility with multiple back problems and spinal disease who presented to the emergency room with fever, cough and shortness of breath for about 2 weeks.  She was originally diagnosed with COVID-19 on 08/17/2019 at urgent care and treated empirically with dexamethasone as outpatient as she was not hypoxic.  Patient came to the emergency room with saturation of 80%, tachypneic, afebrile.  CT angios showed acute pulmonary embolism with moderate clot burden and bilateral multifocal infiltrates.  Started on IV heparin and admitted to [facility]. Remains in the hospital waiting to go to SNF.  Assessment & Plan:   Principal Problem:   Acute pulmonary embolism (HCC) Active Problems:   HYPOTHYROIDISM, POSTSURGICAL   Hypertension   Pulmonary embolism (HCC)   Acute respiratory failure with hypoxia (HCC)   Obesity, Class III, BMI 40-49.9 (morbid obesity) ([place])  Acute respiratory failure with hypoxia, multifactorial in the setting of COVID-19 pneumonia and pulmonary embolism: Respiratory symptoms improving.  Continue chest physiotherapy.  Continue mobilization.  Mostly on room air.  Finished remdesivir therapy for 5 days.  Finished 10 days of steroid therapy.  Mostly improved.  Has some pleuritic pain, improved with tramadol.  Acute pulmonary embolism: With history of thromboembolism.  Probably aggravated by acute viral infection, however given second episode of thromboembolism and moderate clot burden, patient will likely benefit with lifelong anticoagulation. Initially treated with heparin infusion and now changed to Xarelto.   Sinus bradycardia: Mostly at sleep.  She has sleep apnea.  Unable to use CPAP in  the hospital.  Not on any AV nodal blockers.  Seen by cardiology.  They recommended outpatient follow-up and ambulatory monitor.  Cardiology to schedule a follow-up.  Hypertension: Blood pressures are stable on ARB and amlodipine. Dose increased.  Hypothyroidism: Continue Synthroid.  Recheck TSH as outpatient.  Sleep apnea: Obstructive sleep apnea.  Will use CPAP after discharge home.  Debility: Patient has significant physical debility and aggravated by current infection.  She will benefit with inpatient therapies at a skilled nursing rehab.   DVT prophylaxis: Xarelto Code Status: Full code Family Communication: None today. Disposition Plan: Skilled nursing rehab when bed available. She will continue to work with nursing staff for ambulation and mobility.   Consultants:   None  Procedures:   None  Antimicrobials:  Antibiotics Given (last 72 hours)    None         Subjective: No overnight events.  Still has some burning sensation of left thigh.  Dizziness is mostly improved.  Chest wall hurts when taking deep breath. Afebrile.  Mostly on room air.  Objective: Vitals:   08/30/19 1634 08/30/19 2001 08/31/19 0326 08/31/19 0813  BP: 124/65 (!) 145/82 (!) 145/79 125/69  Pulse: (!) 59 69 (!) 58 61  Resp: 20 19 19 19   Temp: 98 F (36.7 C) 98.4 F (36.9 C) (!) 97.4 F (36.3 C) 97.6 F (36.4 C)  TempSrc: Oral Oral Oral Oral  SpO2: 94% 100% 100% 98%  Weight:      Height:        Intake/Output Summary (Last 24 hours) at 08/31/2019 1425 Last data filed at 08/31/2019 0900 Gross per 24 hour  Intake 480 ml  Output -  Net 480 ml  Filed Weights   08/21/19 1827  Weight: 136.1 kg    Examination:  Physical Exam  Constitutional: She is oriented to person, place, and time.  Sitting at the edge of the bed.  Not in any distress.  On room air.  HENT:  Head: Normocephalic.  Eyes: Pupils are equal, round, and reactive to light.  Cardiovascular: Normal rate and regular  rhythm.  Pulmonary/Chest:  No added sounds.  On room air.  Abdominal: Bowel sounds are normal.  Musculoskeletal:     Cervical back: Neck supple.  Neurological: She is alert and oriented to person, place, and time.  Examination of the leg and thighs with no focal deficits, no swelling or edema.  No localized tenderness.    Data Reviewed: I have personally reviewed following labs and imaging studies  CBC: Recent Labs  Lab 08/25/19 0412 08/30/19 0650  WBC 15.6* 17.0*  NEUTROABS 11.4*  --   HGB 12.0 12.5  HCT 37.3 38.8  MCV 88.6 88.4  PLT 280 [id number]   Basic Metabolic Panel: Recent Labs  Lab 08/25/19 0412  NA 138  K 4.2  CL 108  CO2 23  GLUCOSE 110*  BUN 24*  CREATININE 0.65  CALCIUM 9.0  MG 2.2   GFR: Estimated Creatinine Clearance: 97.1 mL/min (by C-G formula based on SCr of 0.65 mg/dL). Liver Function Tests: Recent Labs  Lab 08/25/19 0412  AST 15  ALT 17  ALKPHOS 80  BILITOT 0.6  PROT 6.3*  ALBUMIN 3.2*   No results for input(s): LIPASE, AMYLASE in the last 168 hours. No results for input(s): AMMONIA in the last 168 hours. Coagulation Profile: No results for input(s): INR, PROTIME in the last 168 hours. Cardiac Enzymes: No results for input(s): CKTOTAL, CKMB, CKMBINDEX, TROPONINI in the last 168 hours. BNP (last 3 results) No results for input(s): PROBNP in the last 8760 hours. HbA1C: No results for input(s): HGBA1C in the last 72 hours. CBG: No results for input(s): GLUCAP in the last 168 hours. Lipid Profile: No results for input(s): CHOL, HDL, LDLCALC, TRIG, CHOLHDL, LDLDIRECT in the last 72 hours. Thyroid Function Tests: No results for input(s): TSH, T4TOTAL, FREET4, T3FREE, THYROIDAB in the last 72 hours. Anemia Panel: No results for input(s): VITAMINB12, FOLATE, FERRITIN, TIBC, IRON, RETICCTPCT in the last 72 hours. Sepsis Labs: No results for input(s): PROCALCITON, LATICACIDVEN in the last 168 hours.  Recent Results (from the past 240  hour(s))  Blood Culture (routine x 2)     Status: None   Collection Time: 08/21/19  8:00 PM   Specimen: BLOOD RIGHT HAND  Result Value Ref Range Status   Specimen Description BLOOD RIGHT HAND  Final   Special Requests   Final    BOTTLES DRAWN AEROBIC AND ANAEROBIC Blood Culture adequate volume   Culture   Final    NO GROWTH 5 DAYS Performed at [facility], [address], [place], [place] [address]    Report Status 08/26/2019 FINAL  Final  Blood Culture (routine x 2)     Status: None   Collection Time: 08/21/19  8:00 PM   Specimen: BLOOD LEFT HAND  Result Value Ref Range Status   Specimen Description BLOOD LEFT HAND  Final   Special Requests   Final    BOTTLES DRAWN AEROBIC AND ANAEROBIC Blood Culture results may not be optimal due to an inadequate volume of blood received in culture bottles   Culture   Final    NO GROWTH 5 DAYS Performed at  [facility], [place] [address] [place], [place] [address]    Report Status 08/26/2019 FINAL  Final         Radiology Studies: No results found.      Scheduled Meds: . amLODipine  10 mg Oral Daily  . irbesartan  300 mg Oral Daily  . levothyroxine  150 mcg Oral QAC breakfast  . mirabegron ER  25 mg Oral Daily  . rivaroxaban  15 mg Oral BID WC   Followed by  . [START ON 09/15/2019] rivaroxaban  20 mg Oral Q supper  . sodium chloride flush  3 mL Intravenous Q12H  . sodium chloride flush  3 mL Intravenous Q12H   Continuous Infusions: . sodium chloride       LOS: 10 days    Time spent: 25 minutes    Barb Merino, MD [facility] Pager [phone]

## 2019-09-01 LAB — CBC
HCT: 38.3 % (ref 36.0–46.0)
Hemoglobin: 12.4 g/dL (ref 12.0–15.0)
MCH: 29.3 pg (ref 26.0–34.0)
MCHC: 32.4 g/dL (ref 30.0–36.0)
MCV: 90.5 fL (ref 80.0–100.0)
Platelets: 250 10*3/uL (ref 150–400)
RBC: 4.23 MIL/uL (ref 3.87–5.11)
RDW: 16.2 % — ABNORMAL HIGH (ref 11.5–15.5)
WBC: 12.2 10*3/uL — ABNORMAL HIGH (ref 4.0–10.5)
nRBC: 0 % (ref 0.0–0.2)

## 2019-09-01 LAB — COMPREHENSIVE METABOLIC PANEL
ALT: 17 U/L (ref 0–44)
AST: 11 U/L — ABNORMAL LOW (ref 15–41)
Albumin: 2.9 g/dL — ABNORMAL LOW (ref 3.5–5.0)
Alkaline Phosphatase: 78 U/L (ref 38–126)
Anion gap: 8 (ref 5–15)
BUN: 31 mg/dL — ABNORMAL HIGH (ref 8–23)
CO2: 24 mmol/L (ref 22–32)
Calcium: 8.4 mg/dL — ABNORMAL LOW (ref 8.9–10.3)
Chloride: 104 mmol/L (ref 98–111)
Creatinine, Ser: 1.04 mg/dL — ABNORMAL HIGH (ref 0.44–1.00)
GFR calc Af Amer: >60 mL/min (ref >60)
GFR calc non Af Amer: 55 mL/min — ABNORMAL LOW (ref >60)
Glucose, Bld: 82 mg/dL (ref 70–99)
Potassium: 3.9 mmol/L (ref 3.5–5.1)
Sodium: 136 mmol/L (ref 135–145)
Total Bilirubin: 0.8 mg/dL (ref 0.3–1.2)
Total Protein: 5.5 g/dL — ABNORMAL LOW (ref 6.5–8.1)

## 2019-09-01 MED ORDER — LACTATED RINGERS IV SOLN: INTRAVENOUS | Status: AC

## 2019-09-01 MED ORDER — LACTATED RINGERS IV BOLUS
1000.0000 mL | Freq: Once | INTRAVENOUS | Status: AC
  Administered 2019-09-01: 1000 mL via INTRAVENOUS

## 2019-09-01 NOTE — Progress Notes (Signed)
OT Cancellation Note  Patient Details Name: Natasha [name] MRN: [id number] DOB: 03-03-1951   Cancelled Treatment:    Reason Eval/Treat Not Completed: Other (comment)(Pt declined due to dizziness and fatigue). Attempted to see pt this afternoon for OT, however pt declined due to feeling too dizzy. RN aware. Pt currently receiving IV fluids. OT will continue to follow.  Lyman Bishop 09/01/2019, 4:38 PM

## 2019-09-01 NOTE — Progress Notes (Signed)
Daughter brought large suitcase to patient. Daughter to pick up suitcase tomorrow.  09/01/19 1200  Patient Belongings  Patient/Family advised about valuables policy? Yes  Belongings at Bedside Other (Comment) (Very large suitcase)

## 2019-09-01 NOTE — Progress Notes (Signed)
PROGRESS NOTE    Natasha [name]  [id number] DOB: [birth year] DOA: 08/21/2019 PCP: Lucianne Lei, MD  Brief Narrative: Patient is 67 year old female with history of hypertension, hypothyroidism, history of left lower extremity DVT for which she was treated with Xarelto, decreased mobility with multiple back problems and spinal disease who presented to the emergency room with fever, cough and shortness of breath for about 2 weeks.  She was originally diagnosed with COVID-19 on 08/17/2019 at urgent care and treated empirically with dexamethasone as outpatient as she was not hypoxic.  Patient came to the emergency room with saturation of 80%, tachypneic, afebrile.  CT angios showed acute pulmonary embolism with moderate clot burden and bilateral multifocal infiltrates.  Started on IV heparin and admitted to [place]. Remains in the hospital waiting to go to SNF.   Assessment & Plan:   Principal Problem:   Acute pulmonary embolism (HCC) Active Problems:   HYPOTHYROIDISM, POSTSURGICAL   Hypertension   Pulmonary embolism (HCC)   Acute respiratory failure with hypoxia (HCC)   Obesity, Class III, BMI 40-49.9 (morbid obesity) ([place])   Acute respiratory failure with hypoxia, multifactorial in the setting of COVID-19 pneumonia and pulmonary embolism: Respiratory symptoms improving.  Continue chest physiotherapy.  Stable today on RA Finished remdesivir therapy for 5 days.  Finished 10 days of steroid therapy.  Mostly improved.  Has some pleuritic pain, improved with tramadol.  Acute pulmonary embolism: With history of thromboembolism.  Probably aggravated by acute viral infection, however given second episode of thromboembolism and moderate clot burden, patient will likely benefit with lifelong anticoagulation. Initially treated with heparin infusion and now changed to Xarelto.  Echo with low normal RV systolic function -> follow outpatient LE [place] without VTE  Hypotension:  lightheadedness today in setting of hypotension (relative), improved after IVF.  Continue to follow with IVF overnight.  Hold ARB and amlodipine.   Follow orthostatic BP's.  Sinus bradycardia: Mostly at sleep.  She has sleep apnea.  Unable to use CPAP in the hospital.  Not on any AV nodal blockers.  Seen by cardiology.  They recommended outpatient follow-up and ambulatory monitor.  Cardiology to schedule Karen Kinnard follow-up.  Hypertension: BP meds on hold due to hypotension, follow.  Hypothyroidism: Continue Synthroid.  Recheck TSH as outpatient.  Sleep apnea: Obstructive sleep apnea.  Will use CPAP after discharge home.  Debility: Patient has significant physical debility and aggravated by current infection.  She will benefit with inpatient therapies at [facility].   DVT prophylaxis: xarelto Code Status: full  Family Communication: none at bedside Disposition Plan: pending SNF placement   Consultants:   cardiology  Procedures:  LE [place] Summary: Right: There is no evidence of deep vein thrombosis in the lower extremity. No cystic structure found in the popliteal fossa. Left: There is no evidence of deep vein thrombosis in the lower extremity. No cystic structure found in the popliteal fossa.  Echo IMPRESSIONS    1. Left ventricular ejection fraction, by visual estimation, is 65 to 70%. The left ventricle has hyperdynamic function. There is no left ventricular hypertrophy.  2. The left ventricle has no regional wall motion abnormalities.  3. Global right ventricle has low normal systolic function.The right ventricular size is mildly enlarged. No increase in right ventricular wall thickness.  4. Presence of pericardial fat pad.  5. The mitral valve is grossly normal. Trivial mitral valve regurgitation.  6. The tricuspid valve is grossly normal. Tricuspid valve regurgitation is not demonstrated.  7.  The tricuspid valve was normal in structure. Tricuspid valve  regurgitation is not demonstrated.  8. The aortic valve is grossly normal. Aortic valve regurgitation is not visualized. No evidence of aortic valve sclerosis or stenosis.  9. The inferior vena cava is dilated in size with <50% respiratory variability, suggesting right atrial pressure of 15 mmHg. 10. Technically challenging study. Normal LV function. RV appears to be mildly enlarged, with borderline normal function. Unable to visualize PA well. 11. The interatrial septum was not assessed.  Antimicrobials:  Anti-infectives (From admission, onward)   Start     Dose/Rate Route Frequency Ordered Stop   08/22/19 1000  remdesivir 100 mg in sodium chloride 0.9 % 100 mL IVPB     100 mg 200 mL/hr over 30 Minutes Intravenous Daily 08/21/19 2255 08/25/19 1005   08/21/19 2300  remdesivir 200 mg in sodium chloride 0.9% 250 mL IVPB     200 mg 580 mL/hr over 30 Minutes Intravenous Once 08/21/19 2255 08/22/19 0311     Subjective: C/o lightheadedness, improving at this time  Objective: Vitals:   09/01/19 0944 09/01/19 0948 09/01/19 1408 09/01/19 1640  BP: (!) 90/54 101/63 111/65 112/69  Pulse: 61 64 64 (!) 52  Resp:    20  Temp:    97.8 F (36.6 C)  TempSrc:    Oral  SpO2: 96% 97%  97%  Weight:      Height:        Intake/Output Summary (Last 24 hours) at 09/01/2019 1824 Last data filed at 09/01/2019 1707 Gross per 24 hour  Intake 1166.37 ml  Output --  Net 1166.37 ml   Filed Weights   08/21/19 1827  Weight: 136.1 kg    Examination:  General exam: Appears calm and comfortable  Respiratory system: Clear to auscultation. Respiratory effort normal. Cardiovascular system: RRR Gastrointestinal system: Abdomen is nondistended, soft and nontender. Central nervous system: Alert and oriented. No focal neurological deficits. Extremities: 1+ LEE  Skin: No rashes, lesions or ulcers Psychiatry: Judgement and insight appear normal. Mood & affect appropriate.     Data Reviewed: I have  personally reviewed following labs and imaging studies  CBC: Recent Labs  Lab 08/30/19 0650 09/01/19 1135  WBC 17.0* 12.2*  HGB 12.5 12.4  HCT 38.8 38.3  MCV 88.4 90.5  PLT 303 [id number]   Basic Metabolic Panel: Recent Labs  Lab 09/01/19 1135  NA 136  K 3.9  CL 104  CO2 24  GLUCOSE 82  BUN 31*  CREATININE 1.04*  CALCIUM 8.4*   GFR: Estimated Creatinine Clearance: 74.7 mL/min (Jeovani Weisenburger) (by C-G formula based on SCr of 1.04 mg/dL (H)). Liver Function Tests: Recent Labs  Lab 09/01/19 1135  AST 11*  ALT 17  ALKPHOS 78  BILITOT 0.8  PROT 5.5*  ALBUMIN 2.9*   No results for input(s): LIPASE, AMYLASE in the last 168 hours. No results for input(s): AMMONIA in the last 168 hours. Coagulation Profile: No results for input(s): INR, PROTIME in the last 168 hours. Cardiac Enzymes: No results for input(s): CKTOTAL, CKMB, CKMBINDEX, TROPONINI in the last 168 hours. BNP (last 3 results) No results for input(s): PROBNP in the last 8760 hours. HbA1C: No results for input(s): HGBA1C in the last 72 hours. CBG: No results for input(s): GLUCAP in the last 168 hours. Lipid Profile: No results for input(s): CHOL, HDL, LDLCALC, TRIG, CHOLHDL, LDLDIRECT in the last 72 hours. Thyroid Function Tests: No results for input(s): TSH, T4TOTAL, FREET4, T3FREE, THYROIDAB in the last 72 hours.  Anemia Panel: No results for input(s): VITAMINB12, FOLATE, FERRITIN, TIBC, IRON, RETICCTPCT in the last 72 hours. Sepsis Labs: No results for input(s): PROCALCITON, LATICACIDVEN in the last 168 hours.  No results found for this or any previous visit (from the past 240 hour(s)).       Radiology Studies: No results found.      Scheduled Meds: . amLODipine  10 mg Oral Daily  . irbesartan  300 mg Oral Daily  . levothyroxine  150 mcg Oral QAC breakfast  . mirabegron ER  25 mg Oral Daily  . rivaroxaban  15 mg Oral BID WC   Followed by  . [START ON 09/15/2019] rivaroxaban  20 mg Oral Q supper  . sodium  chloride flush  3 mL Intravenous Q12H   Continuous Infusions: . sodium chloride    . lactated ringers 125 mL/hr at 09/01/19 1707     LOS: 11 days    Time spent: over 71 min    Fayrene Helper, MD [facility] Pager AMION  If 7PM-7AM, please contact night-coverage [email or web address] Password [facility] 09/01/2019, 6:24 PM

## 2019-09-01 NOTE — Progress Notes (Signed)
   09/01/19 [id number]  Provider Notification  Provider Name/Title Fayrene Helper, MD  Date Provider Notified 09/01/19  Time Provider Notified [phone]  Notification Type Page  Notification Reason Change in status (Feels dizzy, drop in BP)  Response Other (Comment) (Will be up to see patient)

## 2019-09-01 NOTE — Care Management Important Message (Signed)
Important Message  Patient Details  Name: Natasha [name] MRN: [id number] Date of Birth: 1951/05/07   Medicare Important Message Given:  Yes - Important Message mailed due to current National Emergency  Verbal consent obtained due to current National Emergency  Relationship to patient: Self Contact Name: Gyda Bricker Call Date: 09/01/19  Time: 1313 Phone: [phone] Outcome: Spoke with contact Important Message mailed to: Patient address on file    [place] 09/01/2019, 1:13 PM

## 2019-09-01 NOTE — Progress Notes (Signed)
   09/01/19 1200  Family/Significant Other Communication  Family/Significant Other Update Called;Updated (Daughter Arycka)

## 2019-09-02 ENCOUNTER — Inpatient Hospital Stay (HOSPITAL_COMMUNITY): Payer: Medicare HMO

## 2019-09-02 LAB — MAGNESIUM: Magnesium: 2.3 mg/dL (ref 1.7–2.4)

## 2019-09-02 LAB — CBC WITH DIFFERENTIAL/PLATELET
Abs Immature Granulocytes: 0.2 10*3/uL — ABNORMAL HIGH (ref 0.00–0.07)
Basophils Absolute: 0.1 10*3/uL (ref 0.0–0.1)
Basophils Relative: 0 %
Eosinophils Absolute: 0.3 10*3/uL (ref 0.0–0.5)
Eosinophils Relative: 2 %
HCT: 41.9 % (ref 36.0–46.0)
Hemoglobin: 13.3 g/dL (ref 12.0–15.0)
Immature Granulocytes: 2 %
Lymphocytes Relative: 27 %
Lymphs Abs: 3.1 10*3/uL (ref 0.7–4.0)
MCH: 28.9 pg (ref 26.0–34.0)
MCHC: 31.7 g/dL (ref 30.0–36.0)
MCV: 90.9 fL (ref 80.0–100.0)
Monocytes Absolute: 1.3 10*3/uL — ABNORMAL HIGH (ref 0.1–1.0)
Monocytes Relative: 11 %
Neutro Abs: 6.5 10*3/uL (ref 1.7–7.7)
Neutrophils Relative %: 58 %
Platelets: 268 10*3/uL (ref 150–400)
RBC: 4.61 MIL/uL (ref 3.87–5.11)
RDW: 16.4 % — ABNORMAL HIGH (ref 11.5–15.5)
WBC: 11.4 10*3/uL — ABNORMAL HIGH (ref 4.0–10.5)
nRBC: 0 % (ref 0.0–0.2)

## 2019-09-02 LAB — COMPREHENSIVE METABOLIC PANEL
ALT: 16 U/L (ref 0–44)
AST: 11 U/L — ABNORMAL LOW (ref 15–41)
Albumin: 3.2 g/dL — ABNORMAL LOW (ref 3.5–5.0)
Alkaline Phosphatase: 89 U/L (ref 38–126)
Anion gap: 12 (ref 5–15)
BUN: 23 mg/dL (ref 8–23)
CO2: 26 mmol/L (ref 22–32)
Calcium: 8.4 mg/dL — ABNORMAL LOW (ref 8.9–10.3)
Chloride: 101 mmol/L (ref 98–111)
Creatinine, Ser: 0.86 mg/dL (ref 0.44–1.00)
GFR calc Af Amer: >60 mL/min (ref >60)
GFR calc non Af Amer: >60 mL/min (ref >60)
Glucose, Bld: 75 mg/dL (ref 70–99)
Potassium: 4.3 mmol/L (ref 3.5–5.1)
Sodium: 139 mmol/L (ref 135–145)
Total Bilirubin: 0.9 mg/dL (ref 0.3–1.2)
Total Protein: 5.9 g/dL — ABNORMAL LOW (ref 6.5–8.1)

## 2019-09-02 LAB — TSH: TSH: 0.288 u[IU]/mL — ABNORMAL LOW (ref 0.350–4.500)

## 2019-09-02 LAB — LIPASE, BLOOD: Lipase: 27 U/L (ref 11–51)

## 2019-09-02 LAB — TROPONIN I (HIGH SENSITIVITY)
Troponin I (High Sensitivity): 4 ng/L (ref <18)
Troponin I (High Sensitivity): 3 ng/L (ref <18)

## 2019-09-02 LAB — PHOSPHORUS: Phosphorus: 3.7 mg/dL (ref 2.5–4.6)

## 2019-09-02 LAB — C-REACTIVE PROTEIN: CRP: 0.7 mg/dL (ref <1.0)

## 2019-09-02 LAB — D-DIMER, QUANTITATIVE: D-Dimer, Quant: 0.64 ug/mL-FEU — ABNORMAL HIGH (ref 0.00–0.50)

## 2019-09-02 LAB — FERRITIN: Ferritin: 168 ng/mL (ref 11–307)

## 2019-09-02 MED ORDER — LACTATED RINGERS IV BOLUS
500.0000 mL | Freq: Once | INTRAVENOUS | Status: AC
  Administered 2019-09-02: 500 mL via INTRAVENOUS

## 2019-09-02 MED ORDER — FAMOTIDINE IN NACL 20-0.9 MG/50ML-% IV SOLN
20.0000 mg | Freq: Two times a day (BID) | INTRAVENOUS | Status: DC
  Administered 2019-09-02 – 2019-09-04 (×4): 20 mg via INTRAVENOUS
  Filled 2019-09-02 (×6): qty 50

## 2019-09-02 NOTE — Progress Notes (Addendum)
Patient to go to MRI at [facility]. [facility] transportation in place. Family and patient updated. Patient to transfer to first floor on tele when able, patient and family aware. IVF paused for transportation. Spoke to MD, ok for patient to transfer to MRI without nurse at bedside.

## 2019-09-02 NOTE — Progress Notes (Signed)
PROGRESS NOTE    Natasha [name]  [id number] DOB: 09-Dec-1950 DOA: 08/21/2019 PCP: Lucianne Lei, MD  Brief Narrative: Patient is 68 year old female with history of hypertension, hypothyroidism, history of left lower extremity DVT for which she was treated with Xarelto, decreased mobility with multiple back problems and spinal disease who presented to the emergency room with fever, cough and shortness of breath for about 2 weeks.  She was originally diagnosed with COVID-19 on 08/17/2019 at urgent care and treated empirically with dexamethasone as outpatient as she was not hypoxic.  Patient came to the emergency room with saturation of 80%, tachypneic, afebrile.  CT angios showed acute pulmonary embolism with moderate clot burden and bilateral multifocal infiltrates.  Started on IV heparin and admitted to [facility]. Remains in the hospital waiting to go to SNF.   Assessment & Plan:   Principal Problem:   Acute pulmonary embolism (HCC) Active Problems:   HYPOTHYROIDISM, POSTSURGICAL   Hypertension   Pulmonary embolism (HCC)   Acute respiratory failure with hypoxia (HCC)   Obesity, Class III, BMI 40-49.9 (morbid obesity) ([place])   Acute respiratory failure with hypoxia, multifactorial in the setting of COVID-19 pneumonia and pulmonary embolism: Respiratory symptoms improving.  Continue chest physiotherapy.  Stable today on RA Finished remdesivir therapy for 5 days.  Finished 10 days of steroid therapy.  Mostly improved.  Has some pleuritic pain, improved with tramadol.  Acute pulmonary embolism: With history of thromboembolism.  Probably aggravated by acute viral infection, however given second episode of thromboembolism and moderate clot burden, patient will likely benefit with lifelong anticoagulation. Initially treated with heparin infusion and now changed to Xarelto.  Echo with low normal RV systolic function -> follow outpatient LE [place] without VTE  Chest Pain: pt  describes CP overnight that improved after she got up and walked.  Described as pressure.  Some CP with palpation. EKG appears similar to prior, isolated T wave inversion in lead III.  Troponin negative x2.  ACS unlikely.  Possible musculoskeletal or GI?  CXR with improved R base aeration, no acute superimposed process. Will continue to monitor Trial pepcid  Right arm and shoulder numbness  RUE weakness: present x ~3 days per patient.  Numbness to 4th and 5th finger and palm.  She also has weakness in her RUE as well.  Hx cervical fusion after accident years ago, though she notes that these symptoms are new. Follow MRI brain and C spine  Hypotension: lightheadedness improved today.  Not orthostatic today, but, still some residual lightheadedness.  She's hypotensive in 80's while lying, but BP improved when she's standing. Hold ARB and amlodipine.   Repeat orthostatic BP tomorrow Continue IVF  Sinus bradycardia: Mostly at sleep.  She has sleep apnea.  Unable to use CPAP in the hospital.  Not on any AV nodal blockers.  Seen by cardiology.  They recommended outpatient follow-up and ambulatory monitor.  Cardiology to schedule [facility] follow-up.  Hypertension: BP meds on hold due to hypotension, follow.  Hypothyroidism: Continue Synthroid.  Recheck TSH as outpatient.  Sleep apnea: Obstructive sleep apnea.  Will use CPAP after discharge home.  Debility: Patient has significant physical debility and aggravated by current infection.  She will benefit with inpatient therapies at [facility].   DVT prophylaxis: xarelto Code Status: full  Family Communication: none at bedside Disposition Plan: pending SNF placement   Consultants:   cardiology  Procedures:  LE [place] Summary: Right: There is no evidence of deep vein thrombosis in  the lower extremity. No cystic structure found in the popliteal fossa. Left: There is no evidence of deep vein thrombosis in the lower extremity. No cystic  structure found in the popliteal fossa.  Echo IMPRESSIONS    1. Left ventricular ejection fraction, by visual estimation, is 65 to 70%. The left ventricle has hyperdynamic function. There is no left ventricular hypertrophy.  2. The left ventricle has no regional wall motion abnormalities.  3. Global right ventricle has low normal systolic function.The right ventricular size is mildly enlarged. No increase in right ventricular wall thickness.  4. Presence of pericardial fat pad.  5. The mitral valve is grossly normal. Trivial mitral valve regurgitation.  6. The tricuspid valve is grossly normal. Tricuspid valve regurgitation is not demonstrated.  7. The tricuspid valve was normal in structure. Tricuspid valve regurgitation is not demonstrated.  8. The aortic valve is grossly normal. Aortic valve regurgitation is not visualized. No evidence of aortic valve sclerosis or stenosis.  9. The inferior vena cava is dilated in size with <50% respiratory variability, suggesting right atrial pressure of 15 mmHg. 10. Technically challenging study. Normal LV function. RV appears to be mildly enlarged, with borderline normal function. Unable to visualize PA well. 11. The interatrial septum was not assessed.  Antimicrobials:  Anti-infectives (From admission, onward)   Start     Dose/Rate Route Frequency Ordered Stop   08/22/19 1000  remdesivir 100 mg in sodium chloride 0.9 % 100 mL IVPB     100 mg 200 mL/hr over 30 Minutes Intravenous Daily 08/21/19 2255 08/25/19 1005   08/21/19 2300  remdesivir 200 mg in sodium chloride 0.9% 250 mL IVPB     200 mg 580 mL/hr over 30 Minutes Intravenous Once 08/21/19 2255 08/22/19 [id number]     Subjective: Feels generally poorly today Had chest discomfort last night - pressure - got better after getting up and walking LH better today than yesterday Describes about 3 days of R hand numbness to ring and little fingers as well as in shoulder and weakness to that side as  well.  She's had cervical fusion, but this is different that the symptoms she'd previously had related to that.  Objective: Vitals:   09/02/19 0700 09/02/19 0755 09/02/19 0809 09/02/19 1607  BP: 133/77 (!) 153/105 122/74 (!) 112/54  Pulse: 72 68 66 62  Resp:    18  Temp: (!) 97 F (36.1 C)   98.5 F (36.9 C)  TempSrc: Oral   Oral  SpO2: 94% 94%    Weight:      Height:        Intake/Output Summary (Last 24 hours) at 09/02/2019 1631 Last data filed at 09/02/2019 1411 Gross per 24 hour  Intake 1934.89 ml  Output 1 ml  Net 1933.89 ml   Filed Weights   08/21/19 1827  Weight: 136.1 kg    Examination:  General: No acute distress. Cardiovascular: RRR Lungs: Clear to auscultation bilaterally  Abdomen: Soft, nontender, nondistended  Neurological: Alert and oriented 3. CN 2-12 intact.  Decreased sensation to light touch of R hand 4th and 5th fingers.  4/5 strength to RUE.   Skin: Warm and dry. No rashes or lesions. Extremities: No clubbing or cyanosis. Trace edema.   Data Reviewed: I have personally reviewed following labs and imaging studies  CBC: Recent Labs  Lab 08/30/19 0650 09/01/19 1135 09/02/19 0451  WBC 17.0* 12.2* 11.4*  NEUTROABS  --   --  6.5  HGB 12.5 12.4 13.3  HCT  38.8 38.3 41.9  MCV 88.4 90.5 90.9  PLT 303 250 [id number]   Basic Metabolic Panel: Recent Labs  Lab 09/01/19 1135 09/02/19 0451  NA 136 139  K 3.9 4.3  CL 104 101  CO2 24 26  GLUCOSE 82 75  BUN 31* 23  CREATININE 1.04* 0.86  CALCIUM 8.4* 8.4*  MG  --  2.3  PHOS  --  3.7   GFR: Estimated Creatinine Clearance: 90.3 mL/min (by C-G formula based on SCr of 0.86 mg/dL). Liver Function Tests: Recent Labs  Lab 09/01/19 1135 09/02/19 0451  AST 11* 11*  ALT 17 16  ALKPHOS 78 89  BILITOT 0.8 0.9  PROT 5.5* 5.9*  ALBUMIN 2.9* 3.2*   Recent Labs  Lab 09/02/19 0451  LIPASE 27   No results for input(s): AMMONIA in the last 168 hours. Coagulation Profile: No results for input(s): INR,  PROTIME in the last 168 hours. Cardiac Enzymes: No results for input(s): CKTOTAL, CKMB, CKMBINDEX, TROPONINI in the last 168 hours. BNP (last 3 results) No results for input(s): PROBNP in the last 8760 hours. HbA1C: No results for input(s): HGBA1C in the last 72 hours. CBG: No results for input(s): GLUCAP in the last 168 hours. Lipid Profile: No results for input(s): CHOL, HDL, LDLCALC, TRIG, CHOLHDL, LDLDIRECT in the last 72 hours. Thyroid Function Tests: No results for input(s): TSH, T4TOTAL, FREET4, T3FREE, THYROIDAB in the last 72 hours. Anemia Panel: Recent Labs    09/02/19 0451  FERRITIN 168   Sepsis Labs: No results for input(s): PROCALCITON, LATICACIDVEN in the last 168 hours.  No results found for this or any previous visit (from the past 240 hour(s)).       Radiology Studies: DG CHEST PORT 1 VIEW  Result Date: 09/02/2019 CLINICAL DATA:  Shortness of breath EXAM: PORTABLE CHEST 1 VIEW COMPARISON:  08/21/2019 FINDINGS: Midline trachea. Mild cardiomegaly. Tortuous thoracic aorta. No pleural effusion or pneumothorax. Chronic interstitial thickening. Further improvement in right base aeration. No well-defined lobar consolidation. IMPRESSION: Improved right base aeration, without acute superimposed process. Cardiomegaly without congestive failure. Electronically Signed   By: Abigail Miyamoto M.D.   On: 09/02/2019 12:42        Scheduled Meds: . levothyroxine  150 mcg Oral QAC breakfast  . mirabegron ER  25 mg Oral Daily  . rivaroxaban  15 mg Oral BID WC   Followed by  . [START ON 09/15/2019] rivaroxaban  20 mg Oral Q supper  . sodium chloride flush  3 mL Intravenous Q12H   Continuous Infusions: . sodium chloride    . lactated ringers 125 mL/hr at 09/02/19 1411     LOS: 12 days    Time spent: over 30 min    Fayrene Helper, MD [facility] Pager AMION  If 7PM-7AM, please contact night-coverage [email or web address] Password [email or web address] 09/02/2019, 4:31 PM

## 2019-09-02 NOTE — Progress Notes (Signed)
Patient c/o of numbness and tingling in hand that radiates to shoulder. Patient has had X3 days, but "did not want to say anything." Patient educated on importance of alerting health team of new onset issues.   09/02/19 [id number]  Provider Notification  Provider Name/Title Hinton Dyer, MD  Date Provider Notified 09/02/19  Time Provider Notified [phone]  Notification Type Page  Notification Reason Change in status (C/O numbness and tingling in hand)  Response Other (Comment) (Will see patient)  Date of Provider Response 09/02/19  Time of Provider Response [phone]

## 2019-09-02 NOTE — Progress Notes (Signed)
MD aware of VS.   09/02/19 0851  Orthostatic Lying   BP- Lying (!) 89/54 (MAP 60)  Pulse- Lying 77  Orthostatic Sitting  BP- Sitting 95/63 (MAP 72)  Pulse- Sitting 80  Orthostatic Standing at 0 minutes  BP- Standing at 0 minutes 101/69 (MAP 78)  Pulse- Standing at 0 minutes 95  Orthostatic Standing at 3 minutes  BP- Standing at 3 minutes 115/83 (MAP 95)  Pulse- Standing at 3 minutes 96

## 2019-09-02 NOTE — Progress Notes (Signed)
Occupational Therapy Treatment Patient Details Name: Natasha [name] MRN: [id number] DOB: 1951/05/30 Today's Date: 09/02/2019    History of present illness Patient is 68 year old female with history of hypertension, hypothyroidism, history of left lower extremity DVT for which she was treated with Xarelto, decreased mobility with multiple back problems and a spinal disease who presented to the emergency room with fever, cough and shortness of breath for about 2 weeks.  She was originally diagnosed with COVID-19 on 08/17/2019 at urgent care and treated empirically with dexamethasone as outpatient as she was not hypoxic.  Patient came to the emergency room with saturation of 80%, tachypneic, afebrile.  CT angios showed acute pulmonary embolism with moderate clot burden and bilateral multifocal infiltrates   OT comments  Pt calling out for bathroom assistance. Attempted to help pt to [facility], however pt determined to walk into the bathroom. After toileting, pt complained of increased dizziness and nausea. BP 110/66 and SpO2 97 on RA. Pt able to walk a few steps to recliner outside of bathroom door then assisted to bed. Nsg in to assist and assess pt. Pt frustrated about her weakness and "slow progress". Continue to recommend post acute rehab.   Follow Up Recommendations  SNF;Supervision/Assistance - 24 hour    Equipment Recommendations  Other (comment)(TBA)    Recommendations for Other Services      Precautions / Restrictions Precautions Precautions: Fall       Mobility Bed Mobility Overal bed mobility: Modified Independent             General bed mobility comments: sitting ROB on entry to room  Transfers Overall transfer level: Needs assistance Equipment used: Rolling walker (2 wheeled) Transfers: Sit to/from Stand Sit to Stand: Min assist Stand pivot transfers: Min assist            Balance Overall balance assessment: Needs assistance   Sitting balance-Leahy Scale: Good        Standing balance-Leahy Scale: Fair                             ADL either performed or assessed with clinical judgement   ADL Overall ADL's : Needs assistance/impaired                         Toilet Transfer: Minimal assistance;Ambulation;RW;Grab bars   Toileting- Clothing Manipulation and Hygiene: Moderate assistance Toileting - Clothing Manipulation Details (indicate cue type and reason): difficulty reaching her bottom; may need toilet tong for pericare     Functional mobility during ADLs: Minimal assistance;Rolling walker;Cueing for safety General ADL Comments: Pt with difficulty with pericare; increased feeling of nausea/dizziness on toilet     Vision       Perception     Praxis      Cognition Arousal/Alertness: Awake/alert Behavior During Therapy: WFL for tasks assessed/performed Overall Cognitive Status: No family/caregiver present to determine baseline cognitive functioning                                 General Comments: decreased awareness of deficits/insight        Exercises     Shoulder Instructions       General Comments      Pertinent Vitals/ Pain       Pain Assessment: Faces Faces Pain Scale: Hurts little more Pain Location: stomach Pain Descriptors / Indicators: Discomfort Pain  Intervention(s): Limited activity within patient's tolerance  Home Living                                          Prior Functioning/Environment              Frequency  Min 2X/week        Progress Toward Goals  OT Goals(current goals can now be found in the care plan section)  Progress towards OT goals: Progressing toward goals  Acute Rehab OT Goals Patient Stated Goal: pt agreeable to rehab  OT Goal Formulation: With patient Time For Goal Achievement: 09/11/19 Potential to Achieve Goals: Good ADL Goals Pt Will Perform Grooming: with modified independence;standing;sitting Pt Will Perform  Lower Body Bathing: with modified independence;sit to/from stand Pt Will Perform Lower Body Dressing: with modified independence;sit to/from stand Pt Will Transfer to Toilet: with modified independence;ambulating Pt Will Perform Toileting - Clothing Manipulation and hygiene: with modified independence;sit to/from stand Additional ADL Goal #1: Pt will verbalize/demonstrate at least 3 energy conservation strategies during ADL task.  Plan Discharge plan remains appropriate    Co-evaluation                 AM-PAC OT "6 Clicks" Daily Activity     Outcome Measure   Help from another person eating meals?: None Help from another person taking care of personal grooming?: A Little Help from another person toileting, which includes using toliet, bedpan, or urinal?: A Lot Help from another person bathing (including washing, rinsing, drying)?: A Little Help from another person to put on and taking off regular upper body clothing?: A Little Help from another person to put on and taking off regular lower body clothing?: A Lot 6 Click Score: 17    End of Session Equipment Utilized During Treatment: Rolling walker  OT Visit Diagnosis: Unsteadiness on feet (R26.81);Muscle weakness (generalized) (M62.81)   Activity Tolerance Patient limited by fatigue   Patient Left in bed;with call bell/phone within reach;with bed alarm set   Nurse Communication Mobility status        Time: [id number] OT Time Calculation (min): 37 min  Charges: OT General Charges $OT Visit: 1 Visit OT Treatments $Self Care/Home Management : 23-37 mins  Maurie Boettcher, OT/L   Acute OT Clinical Specialist [place] Pager [phone] Office [phone]    [facility] 09/02/2019, 3:37 PM

## 2019-09-02 NOTE — Progress Notes (Signed)
PT Cancellation Note  Patient Details Name: ITHZEL ENGLEDOW MRN: [id number] DOB: 26-Dec-1950   Cancelled Treatment:    Reason Eval/Treat Not Completed: Medical issues which prohibited therapy,    Claretha Cooper 09/02/2019, 10:13 AM Tresa Endo PT Acute Rehabilitation Services Pager [phone] Office [phone]

## 2019-09-02 NOTE — Progress Notes (Signed)
Family updated on patient pending transfer to first floor. Patient has increased weakness, nausea, and presyncopal episode while using bathroom.   09/02/19 1500  Provider Notification  Provider Name/Title Fayrene Helper, MD  Date Provider Notified 09/02/19  Time Provider Notified 1500  Notification Type Page  Notification Reason Change in status (presyncopal episode, increased weakness)  Response See new orders  Date of Provider Response 09/02/19  Time of Provider Response 1501

## 2019-09-02 NOTE — Progress Notes (Signed)
   09/02/19 1506  Family/Significant Other Communication  Family/Significant Other Update Called;Updated (daughter Trecia Rogers)

## 2019-09-02 NOTE — Progress Notes (Signed)
   09/02/19 0908  Family/Significant Other Communication  Family/Significant Other Update Called;Updated (Daughter Arycka)

## 2019-09-02 NOTE — Progress Notes (Signed)
Patient refused to do exam.  Patient stated that she would not try exam even with medication.  Patient being sent back to [facility] @ 7:10 PM on 09/02/2019.

## 2019-09-03 ENCOUNTER — Inpatient Hospital Stay (HOSPITAL_COMMUNITY): Payer: Medicare HMO

## 2019-09-03 LAB — CBC WITH DIFFERENTIAL/PLATELET
Abs Immature Granulocytes: 0.09 10*3/uL — ABNORMAL HIGH (ref 0.00–0.07)
Basophils Absolute: 0 10*3/uL (ref 0.0–0.1)
Basophils Relative: 0 %
Eosinophils Absolute: 0.3 10*3/uL (ref 0.0–0.5)
Eosinophils Relative: 3 %
HCT: 37.4 % (ref 36.0–46.0)
Hemoglobin: 12.1 g/dL (ref 12.0–15.0)
Immature Granulocytes: 1 %
Lymphocytes Relative: 24 %
Lymphs Abs: 2.2 10*3/uL (ref 0.7–4.0)
MCH: 28.9 pg (ref 26.0–34.0)
MCHC: 32.4 g/dL (ref 30.0–36.0)
MCV: 89.5 fL (ref 80.0–100.0)
Monocytes Absolute: 1.2 10*3/uL — ABNORMAL HIGH (ref 0.1–1.0)
Monocytes Relative: 14 %
Neutro Abs: 5.3 10*3/uL (ref 1.7–7.7)
Neutrophils Relative %: 58 %
Platelets: 259 10*3/uL (ref 150–400)
RBC: 4.18 MIL/uL (ref 3.87–5.11)
RDW: 16.1 % — ABNORMAL HIGH (ref 11.5–15.5)
WBC: 9.1 10*3/uL (ref 4.0–10.5)
nRBC: 0 % (ref 0.0–0.2)

## 2019-09-03 LAB — COMPREHENSIVE METABOLIC PANEL
ALT: 18 U/L (ref 0–44)
AST: 11 U/L — ABNORMAL LOW (ref 15–41)
Albumin: 3.2 g/dL — ABNORMAL LOW (ref 3.5–5.0)
Alkaline Phosphatase: 81 U/L (ref 38–126)
Anion gap: 9 (ref 5–15)
BUN: 15 mg/dL (ref 8–23)
CO2: 27 mmol/L (ref 22–32)
Calcium: 8.6 mg/dL — ABNORMAL LOW (ref 8.9–10.3)
Chloride: 103 mmol/L (ref 98–111)
Creatinine, Ser: 0.73 mg/dL (ref 0.44–1.00)
GFR calc Af Amer: >60 mL/min (ref >60)
GFR calc non Af Amer: >60 mL/min (ref >60)
Glucose, Bld: 93 mg/dL (ref 70–99)
Potassium: 4 mmol/L (ref 3.5–5.1)
Sodium: 139 mmol/L (ref 135–145)
Total Bilirubin: 1.1 mg/dL (ref 0.3–1.2)
Total Protein: 5.9 g/dL — ABNORMAL LOW (ref 6.5–8.1)

## 2019-09-03 LAB — BRAIN NATRIURETIC PEPTIDE: B Natriuretic Peptide: 30.7 pg/mL (ref 0.0–100.0)

## 2019-09-03 LAB — FERRITIN: Ferritin: 167 ng/mL (ref 11–307)

## 2019-09-03 LAB — MAGNESIUM: Magnesium: 2.2 mg/dL (ref 1.7–2.4)

## 2019-09-03 LAB — C-REACTIVE PROTEIN: CRP: 1.3 mg/dL — ABNORMAL HIGH (ref <1.0)

## 2019-09-03 LAB — D-DIMER, QUANTITATIVE: D-Dimer, Quant: 0.58 ug/mL-FEU — ABNORMAL HIGH (ref 0.00–0.50)

## 2019-09-03 LAB — PHOSPHORUS: Phosphorus: 3.5 mg/dL (ref 2.5–4.6)

## 2019-09-03 NOTE — Plan of Care (Signed)
  Problem: Education: Goal: Knowledge of risk factors and measures for prevention of condition will improve Outcome: Progressing   Problem: Coping: Goal: Psychosocial and spiritual needs will be supported Outcome: Progressing   Problem: Respiratory: Goal: Will maintain a patent airway Outcome: Progressing Goal: Complications related to the disease process, condition or treatment will be avoided or minimized Outcome: Progressing   

## 2019-09-03 NOTE — Progress Notes (Signed)
PROGRESS NOTE    Natasha [name]  [id number] DOB: [birth year] DOA: 08/21/2019 PCP: Natasha Lei, MD  Brief Narrative: Patient is 69 year old female with history of hypertension, hypothyroidism, history of left lower extremity DVT for which she was treated with Xarelto, decreased mobility with multiple back problems and spinal disease who presented to the emergency room with fever, cough and shortness of breath for about 2 weeks.  She was originally diagnosed with COVID-19 on 08/17/2019 at urgent care and treated empirically with dexamethasone as outpatient as she was not hypoxic.  Patient came to the emergency room with saturation of 80%, tachypneic, afebrile.  CT angios showed acute pulmonary embolism with moderate clot burden and bilateral multifocal infiltrates.  Started on IV heparin and admitted to [facility]. Remains in the hospital waiting to go to SNF.   Assessment & Plan:   Principal Problem:   Acute pulmonary embolism (HCC) Active Problems:   HYPOTHYROIDISM, POSTSURGICAL   Hypertension   Pulmonary embolism (HCC)   Acute respiratory failure with hypoxia (HCC)   Obesity, Class III, BMI 40-49.9 (morbid obesity) ([place])   Acute respiratory failure with hypoxia, multifactorial in the setting of COVID-19 pneumonia and pulmonary embolism: Respiratory symptoms improving.  Continue chest physiotherapy.  Stable today on RA Finished remdesivir therapy for 5 days.  Finished 10 days of steroid therapy.  Mostly improved.  Has some pleuritic pain, improved with tramadol.  Acute pulmonary embolism: With history of thromboembolism.  Probably aggravated by acute viral infection, however given second episode of thromboembolism and moderate clot burden, patient will likely benefit with lifelong anticoagulation. Initially treated with heparin infusion and now changed to Xarelto.  Echo with low normal RV systolic function -> follow outpatient LE [place] without VTE  Chest Pain: pt  describes CP overnight that improved after she got up and walked.  Described as pressure.  Some CP with palpation. EKG appears similar to prior, isolated T wave inversion in lead III.  Troponin negative x2.  ACS unlikely.  Possible musculoskeletal or GI?  CXR with improved R base aeration, no acute superimposed process. Will continue to monitor Trial pepcid - symptoms improved with pepcid, sx likely 2/2 reflux  Right arm and shoulder numbness  RUE weakness: present x ~3 days per patient.  Numbness to 4th and 5th finger and palm.  She also has weakness in her RUE as well and some numbness to sole of R foot.  Hx cervical fusion after accident years ago, though she notes that these symptoms are new. Follow MRI brain and C spine - she declined this as is claustrophobic and only does open MRI's.  Discussed with neurology, will follow CT head and neck and follow images with neuro.  Hypotension: lightheadedness improved today.  Not orthostatic today, but, still some residual lightheadedness.  Orthostasis resolved.  BP ok today.  Continue to monitor.  Sinus bradycardia: Mostly at sleep.  She has sleep apnea.  Unable to use CPAP in the hospital.  Not on any AV nodal blockers.  Seen by cardiology.  They recommended outpatient follow-up and ambulatory monitor.  Cardiology to schedule Fouad Taul follow-up.  Hypertension: BP meds on hold due to hypotension, follow.  Hypothyroidism: Continue Synthroid.  Recheck TSH as outpatient.  Sleep apnea: Obstructive sleep apnea.  Will use CPAP after discharge home.  Debility: Patient has significant physical debility and aggravated by current infection.  She will benefit with inpatient therapies at [facility] re[facility].   DVT prophylaxis: xarelto Code Status: full  Family Communication:  none at bedside Disposition Plan: pending SNF placement   Consultants:   cardiology  Procedures:  LE [place] Summary: Right: There is no evidence of deep vein thrombosis in the  lower extremity. No cystic structure found in the popliteal fossa. Left: There is no evidence of deep vein thrombosis in the lower extremity. No cystic structure found in the popliteal fossa.  Echo IMPRESSIONS    1. Left ventricular ejection fraction, by visual estimation, is 65 to 70%. The left ventricle has hyperdynamic function. There is no left ventricular hypertrophy.  2. The left ventricle has no regional wall motion abnormalities.  3. Global right ventricle has low normal systolic function.The right ventricular size is mildly enlarged. No increase in right ventricular wall thickness.  4. Presence of pericardial fat pad.  5. The mitral valve is grossly normal. Trivial mitral valve regurgitation.  6. The tricuspid valve is grossly normal. Tricuspid valve regurgitation is not demonstrated.  7. The tricuspid valve was normal in structure. Tricuspid valve regurgitation is not demonstrated.  8. The aortic valve is grossly normal. Aortic valve regurgitation is not visualized. No evidence of aortic valve sclerosis or stenosis.  9. The inferior vena cava is dilated in size with <50% respiratory variability, suggesting right atrial pressure of 15 mmHg. 10. Technically challenging study. Normal LV function. RV appears to be mildly enlarged, with borderline normal function. Unable to visualize PA well. 11. The interatrial septum was not assessed.  Antimicrobials:  Anti-infectives (From admission, onward)   Start     Dose/Rate Route Frequency Ordered Stop   08/22/19 1000  remdesivir 100 mg in sodium chloride 0.9 % 100 mL IVPB     100 mg 200 mL/hr over 30 Minutes Intravenous Daily 08/21/19 2255 08/25/19 1005   08/21/19 2300  remdesivir 200 mg in sodium chloride 0.9% 250 mL IVPB     200 mg 580 mL/hr over 30 Minutes Intravenous Once 08/21/19 2255 08/22/19 0311     Subjective: Feels [name] bit better today pepcid helping Still has neuro symptoms, mentions foot today which she didn't mention  yesterday  Objective: Vitals:   09/03/19 0906 09/03/19 0910 09/03/19 0916 09/03/19 1619  BP: 112/73 (!) 110/54 (!) 119/96 113/63  Pulse: 80 87 95 70  Resp:    16  Temp:    97.8 F (36.6 C)  TempSrc:    Oral  SpO2:    100%  Weight:      Height:        Intake/Output Summary (Last 24 hours) at 09/03/2019 1624 Last data filed at 09/03/2019 1142 Gross per 24 hour  Intake 245 ml  Output 2 ml  Net 243 ml   Filed Weights   08/21/19 1827  Weight: 136.1 kg    Examination:  General: No acute distress. Cardiovascular: RRR Lungs: Clear to auscultation bilaterally Abdomen: Soft, nontender, nondistended Neurological: Alert and oriented 3. Cranial nerves II through XII intact. Skin: Warm and dry. No rashes or lesions. Extremities: No clubbing or cyanosis. No edema.   Data Reviewed: I have personally reviewed following labs and imaging studies  CBC: Recent Labs  Lab 08/30/19 0650 09/01/19 1135 09/02/19 0451 09/03/19 0520  WBC 17.0* 12.2* 11.4* 9.1  NEUTROABS  --   --  6.5 5.3  HGB 12.5 12.4 13.3 12.1  HCT 38.8 38.3 41.9 37.4  MCV 88.4 90.5 90.9 89.5  PLT 303 250 268 [id number]   Basic Metabolic Panel: Recent Labs  Lab 09/01/19 1135 09/02/19 0451 09/03/19 0520  NA 136 139 139  K 3.9 4.3 4.0  CL 104 101 103  CO2 24 26 27   GLUCOSE 82 75 93  BUN 31* 23 15  CREATININE 1.04* 0.86 0.73  CALCIUM 8.4* 8.4* 8.6*  MG  --  2.3 2.2  PHOS  --  3.7 3.5   GFR: Estimated Creatinine Clearance: 97.1 mL/min (by C-G formula based on SCr of 0.73 mg/dL). Liver Function Tests: Recent Labs  Lab 09/01/19 1135 09/02/19 0451 09/03/19 0520  AST 11* 11* 11*  ALT 17 16 18   ALKPHOS 78 89 81  BILITOT 0.8 0.9 1.1  PROT 5.5* 5.9* 5.9*  ALBUMIN 2.9* 3.2* 3.2*   Recent Labs  Lab 09/02/19 0451  LIPASE 27   No results for input(s): AMMONIA in the last 168 hours. Coagulation Profile: No results for input(s): INR, PROTIME in the last 168 hours. Cardiac Enzymes: No results for input(s):  CKTOTAL, CKMB, CKMBINDEX, TROPONINI in the last 168 hours. BNP (last 3 results) No results for input(s): PROBNP in the last 8760 hours. HbA1C: No results for input(s): HGBA1C in the last 72 hours. CBG: No results for input(s): GLUCAP in the last 168 hours. Lipid Profile: No results for input(s): CHOL, HDL, LDLCALC, TRIG, CHOLHDL, LDLDIRECT in the last 72 hours. Thyroid Function Tests: Recent Labs    09/02/19 0451  TSH 0.288*   Anemia Panel: Recent Labs    09/02/19 0451 09/03/19 0520  FERRITIN 168 167   Sepsis Labs: No results for input(s): PROCALCITON, LATICACIDVEN in the last 168 hours.  No results found for this or any previous visit (from the past 240 hour(s)).       Radiology Studies: DG CHEST PORT 1 VIEW  Result Date: 09/02/2019 CLINICAL DATA:  Shortness of breath EXAM: PORTABLE CHEST 1 VIEW COMPARISON:  08/21/2019 FINDINGS: Midline trachea. Mild cardiomegaly. Tortuous thoracic aorta. No pleural effusion or pneumothorax. Chronic interstitial thickening. Further improvement in right base aeration. No well-defined lobar consolidation. IMPRESSION: Improved right base aeration, without acute superimposed process. Cardiomegaly without congestive failure. Electronically Signed   By: Abigail Miyamoto M.D.   On: 09/02/2019 12:42        Scheduled Meds: . levothyroxine  150 mcg Oral QAC breakfast  . mirabegron ER  25 mg Oral Daily  . rivaroxaban  15 mg Oral BID WC   Followed by  . [START ON 09/15/2019] rivaroxaban  20 mg Oral Q supper  . sodium chloride flush  3 mL Intravenous Q12H   Continuous Infusions: . sodium chloride    . famotidine (PEPCID) IV Stopped (09/03/19 1142)     LOS: 13 days    Time spent: over 30 min    Fayrene Helper, MD [facility] Pager AMION  If 7PM-7AM, please contact night-coverage [email or web address] Password [facility] 09/03/2019, 4:24 PM

## 2019-09-04 LAB — MAGNESIUM: Magnesium: 2.2 mg/dL (ref 1.7–2.4)

## 2019-09-04 LAB — CBC WITH DIFFERENTIAL/PLATELET
Abs Immature Granulocytes: 0.06 10*3/uL (ref 0.00–0.07)
Basophils Absolute: 0 10*3/uL (ref 0.0–0.1)
Basophils Relative: 1 %
Eosinophils Absolute: 0.3 10*3/uL (ref 0.0–0.5)
Eosinophils Relative: 3 %
HCT: 37.7 % (ref 36.0–46.0)
Hemoglobin: 12.2 g/dL (ref 12.0–15.0)
Immature Granulocytes: 1 %
Lymphocytes Relative: 28 %
Lymphs Abs: 2.5 10*3/uL (ref 0.7–4.0)
MCH: 29.3 pg (ref 26.0–34.0)
MCHC: 32.4 g/dL (ref 30.0–36.0)
MCV: 90.4 fL (ref 80.0–100.0)
Monocytes Absolute: 1 10*3/uL (ref 0.1–1.0)
Monocytes Relative: 11 %
Neutro Abs: 5 10*3/uL (ref 1.7–7.7)
Neutrophils Relative %: 56 %
Platelets: 235 10*3/uL (ref 150–400)
RBC: 4.17 MIL/uL (ref 3.87–5.11)
RDW: 16.3 % — ABNORMAL HIGH (ref 11.5–15.5)
WBC: 8.8 10*3/uL (ref 4.0–10.5)
nRBC: 0 % (ref 0.0–0.2)

## 2019-09-04 LAB — FERRITIN: Ferritin: 183 ng/mL (ref 11–307)

## 2019-09-04 LAB — D-DIMER, QUANTITATIVE: D-Dimer, Quant: 0.41 ug/mL-FEU (ref 0.00–0.50)

## 2019-09-04 LAB — COMPREHENSIVE METABOLIC PANEL
ALT: 18 U/L (ref 0–44)
AST: 12 U/L — ABNORMAL LOW (ref 15–41)
Albumin: 3.1 g/dL — ABNORMAL LOW (ref 3.5–5.0)
Alkaline Phosphatase: 82 U/L (ref 38–126)
Anion gap: 10 (ref 5–15)
BUN: 16 mg/dL (ref 8–23)
CO2: 27 mmol/L (ref 22–32)
Calcium: 8.5 mg/dL — ABNORMAL LOW (ref 8.9–10.3)
Chloride: 100 mmol/L (ref 98–111)
Creatinine, Ser: 0.58 mg/dL (ref 0.44–1.00)
GFR calc Af Amer: >60 mL/min (ref >60)
GFR calc non Af Amer: >60 mL/min (ref >60)
Glucose, Bld: 85 mg/dL (ref 70–99)
Potassium: 4.1 mmol/L (ref 3.5–5.1)
Sodium: 137 mmol/L (ref 135–145)
Total Bilirubin: 1.2 mg/dL (ref 0.3–1.2)
Total Protein: 5.8 g/dL — ABNORMAL LOW (ref 6.5–8.1)

## 2019-09-04 LAB — LIPID PANEL
Cholesterol: 174 mg/dL (ref 0–200)
HDL: 76 mg/dL (ref >40)
LDL Cholesterol: 80 mg/dL (ref 0–99)
Total CHOL/HDL Ratio: 2.3 RATIO
Triglycerides: 89 mg/dL (ref <150)
VLDL: 18 mg/dL (ref 0–40)

## 2019-09-04 LAB — C-REACTIVE PROTEIN: CRP: 1.4 mg/dL — ABNORMAL HIGH (ref <1.0)

## 2019-09-04 LAB — PHOSPHORUS: Phosphorus: 3.3 mg/dL (ref 2.5–4.6)

## 2019-09-04 MED ORDER — FAMOTIDINE 20 MG PO TABS
20.0000 mg | ORAL_TABLET | Freq: Two times a day (BID) | ORAL | Status: DC
  Administered 2019-09-04 – 2019-09-06 (×4): 20 mg via ORAL
  Filled 2019-09-04 (×4): qty 1

## 2019-09-04 NOTE — TOC Progression Note (Addendum)
Transition of Care [facility]) - Progression Note    Patient Details  Name: MELITZA LISIECKI MRN: [id number] Date of Birth: May 16, 1951  Transition of Care [facility]) CM/SW [place],  Phone Number: [phone] 09/04/2019, 9:08 AM  Clinical Narrative:     Update: [facility] reports no beds available today.   CSW reached out to [place] with admissions at [facility] to identify is Josem Kaufmann was needed for patient or if waiver applies. Appears RNCM requested auth to be started by facility on 12/29. Pending response from [facility] at this time.   Expected Discharge Plan: Skilled Nursing Facility Barriers to Discharge: Continued Medical Work up  Expected Discharge Plan and Services Expected Discharge Plan: [place]                                               Social Determinants of Health (SDOH) Interventions    Readmission Risk Interventions No flowsheet data found.

## 2019-09-04 NOTE — Progress Notes (Signed)
PROGRESS NOTE    Natasha [name]  [id number] DOB: 1951-08-29 DOA: 08/21/2019 PCP: Lucianne Lei, MD  Brief Narrative: Patient is 69 year old female with history of hypertension, hypothyroidism, history of left lower extremity DVT for which she was treated with Xarelto, decreased mobility with multiple back problems and spinal disease who presented to the emergency room with fever, cough and shortness of breath for about 2 weeks.  She was originally diagnosed with COVID-19 on 08/17/2019 at urgent care and treated empirically with dexamethasone as outpatient as she was not hypoxic.  Patient came to the emergency room with saturation of 80%, tachypneic, afebrile.  CT angios showed acute pulmonary embolism with moderate clot burden and bilateral multifocal infiltrates.  Started on IV heparin and admitted to [facility]. Remains in the hospital waiting to go to SNF.   Assessment & Plan:   Principal Problem:   Acute pulmonary embolism (HCC) Active Problems:   HYPOTHYROIDISM, POSTSURGICAL   Hypertension   Pulmonary embolism (HCC)   Acute respiratory failure with hypoxia (HCC)   Obesity, Class III, BMI 40-49.9 (morbid obesity) ([place])   Acute respiratory failure with hypoxia, multifactorial in the setting of COVID-19 pneumonia and pulmonary embolism: Respiratory symptoms improving.  Continue chest physiotherapy.  Stable today on RA Finished remdesivir therapy for 5 days.  Finished 10 days of steroid therapy.  Mostly improved.  Has some pleuritic pain, improved with tramadol.  Acute pulmonary embolism: With history of thromboembolism.  Probably aggravated by acute viral infection, however given second episode of thromboembolism and moderate clot burden, patient will likely benefit with lifelong anticoagulation. Initially treated with heparin infusion and now changed to Xarelto.  Echo with low normal RV systolic function -> follow outpatient LE [place] without VTE  Chest Pain: pt  describes CP overnight that improved after she got up and walked.  Described as pressure.  Some CP with palpation. EKG appears similar to prior, isolated T wave inversion in lead III.  Troponin negative x2.  ACS unlikely.  Possible musculoskeletal or GI?  CXR with improved R base aeration, no acute superimposed process. Will continue to monitor Trial pepcid - symptoms improved with pepcid, sx likely 2/2 reflux  Right arm and shoulder numbness   RUE weakness: present x ~3 days per patient.  Numbness to 4th and 5th finger and palm.  She also has weakness in her RUE as well and some numbness to sole of R foot.  Hx cervical fusion after accident years ago, though she notes that these symptoms are new. Follow MRI brain and C spine - she declined this as is claustrophobic and only does open MRI's.   CT head and neck unrevealing Will repeat head CT Sunday after discussion with neurology  Hypotension: improved.  Continue to hold BP medications.  Sinus bradycardia: Mostly at sleep.  She has sleep apnea.  Unable to use CPAP in the hospital.  Not on any AV nodal blockers.  Seen by cardiology.  They recommended outpatient follow-up and ambulatory monitor.  Cardiology to schedule Phill Steck follow-up.  Hypertension: BP meds on hold due to hypotension, follow.  Hypothyroidism: Continue Synthroid.  Recheck TSH as outpatient.  Sleep apnea: Obstructive sleep apnea.  Will use CPAP after discharge home.  Debility: Patient has significant physical debility and aggravated by current infection.  She will benefit with inpatient therapies at [facility] skilled nursing re[facility].   DVT prophylaxis: xarelto Code Status: full  Family Communication: none at bedside Disposition Plan: pending SNF placement   Consultants:   cardiology  Procedures:  LE [place] Summary: Right: There is no evidence of deep vein thrombosis in the lower extremity. No cystic structure found in the popliteal fossa. Left: There is no evidence of deep vein  thrombosis in the lower extremity. No cystic structure found in the popliteal fossa.  Echo IMPRESSIONS    1. Left ventricular ejection fraction, by visual estimation, is 65 to 70%. The left ventricle has hyperdynamic function. There is no left ventricular hypertrophy.  2. The left ventricle has no regional wall motion abnormalities.  3. Global right ventricle has low normal systolic function.The right ventricular size is mildly enlarged. No increase in right ventricular wall thickness.  4. Presence of pericardial fat pad.  5. The mitral valve is grossly normal. Trivial mitral valve regurgitation.  6. The tricuspid valve is grossly normal. Tricuspid valve regurgitation is not demonstrated.  7. The tricuspid valve was normal in structure. Tricuspid valve regurgitation is not demonstrated.  8. The aortic valve is grossly normal. Aortic valve regurgitation is not visualized. No evidence of aortic valve sclerosis or stenosis.  9. The inferior vena cava is dilated in size with <50% respiratory variability, suggesting right atrial pressure of 15 mmHg. 10. Technically challenging study. Normal LV function. RV appears to be mildly enlarged, with borderline normal function. Unable to visualize PA well. 11. The interatrial septum was not assessed.  Antimicrobials:  Anti-infectives (From admission, onward)   Start     Dose/Rate Route Frequency Ordered Stop   08/22/19 1000  remdesivir 100 mg in sodium chloride 0.9 % 100 mL IVPB     100 mg 200 mL/hr over 30 Minutes Intravenous Daily 08/21/19 2255 08/25/19 1005   08/21/19 2300  remdesivir 200 mg in sodium chloride 0.9% 250 mL IVPB     200 mg 580 mL/hr over 30 Minutes Intravenous Once 08/21/19 2255 08/22/19 0311     Subjective: Feels better overall Tired Persistent numbness in RUE and R foot.   Objective: Vitals:   09/03/19 1939 09/04/19 0415 09/04/19 0800 09/04/19 1619  BP: 122/68 119/72 122/69 (!) 143/71  Pulse: 73 70 (!) 53 (!) 59  Resp:  18 18 18    Temp: 97.9 F (36.6 C) 98.1 F (36.7 C) 98.5 F (36.9 C) 99.1 F (37.3 C)  TempSrc: Oral Oral Oral Oral  SpO2: 100%  98% 98%  Weight:      Height:        Intake/Output Summary (Last 24 hours) at 09/04/2019 1631 Last data filed at 09/04/2019 1043 Gross per 24 hour  Intake 290 ml  Output 2 ml  Net 288 ml   Filed Weights   08/21/19 1827  Weight: 136.1 kg    Examination:  General: No acute distress. Cardiovascular: Heart sounds show Corda Shutt regular rate, and rhythm Lungs: Clear to auscultation bilaterally  Abdomen: Soft, nontender, nondistended  Neurological: Alert and oriented 3. Moves all extremities 4Cranial nerves II through XII intact.  4/5 strength to RUE.  Decreased sensation to 4th and 5th finger of RUE and to sole of foot. Skin: Warm and dry. No rashes or lesions. Extremities: No clubbing or cyanosis. No edema.    Data Reviewed: I have personally reviewed following labs and imaging studies  CBC: Recent Labs  Lab 08/30/19 0650 09/01/19 1135 09/02/19 0451 09/03/19 0520 09/04/19 0435  WBC 17.0* 12.2* 11.4* 9.1 8.8  NEUTROABS  --   --  6.5 5.3 5.0  HGB 12.5 12.4 13.3 12.1 12.2  HCT 38.8 38.3 41.9 37.4 37.7  MCV 88.4 90.5 90.9  89.5 90.4  PLT 303 250 268 259 [id number]   Basic Metabolic Panel: Recent Labs  Lab 09/01/19 1135 09/02/19 0451 09/03/19 0520 09/04/19 0435  NA 136 139 139 137  K 3.9 4.3 4.0 4.1  CL 104 101 103 100  CO2 24 26 27 27   GLUCOSE 82 75 93 85  BUN 31* 23 15 16   CREATININE 1.04* 0.86 0.73 0.58  CALCIUM 8.4* 8.4* 8.6* 8.5*  MG  --  2.3 2.2 2.2  PHOS  --  3.7 3.5 3.3   GFR: Estimated Creatinine Clearance: 97.1 mL/min (by C-G formula based on SCr of 0.58 mg/dL). Liver Function Tests: Recent Labs  Lab 09/01/19 1135 09/02/19 0451 09/03/19 0520 09/04/19 0435  AST 11* 11* 11* 12*  ALT 17 16 18 18   ALKPHOS 78 89 81 82  BILITOT 0.8 0.9 1.1 1.2  PROT 5.5* 5.9* 5.9* 5.8*  ALBUMIN 2.9* 3.2* 3.2* 3.1*   Recent Labs  Lab  09/02/19 0451  LIPASE 27   No results for input(s): AMMONIA in the last 168 hours. Coagulation Profile: No results for input(s): INR, PROTIME in the last 168 hours. Cardiac Enzymes: No results for input(s): CKTOTAL, CKMB, CKMBINDEX, TROPONINI in the last 168 hours. BNP (last 3 results) No results for input(s): PROBNP in the last 8760 hours. HbA1C: No results for input(s): HGBA1C in the last 72 hours. CBG: No results for input(s): GLUCAP in the last 168 hours. Lipid Profile: Recent Labs    09/04/19 1230  CHOL 174  HDL 76  LDLCALC 80  TRIG 89  CHOLHDL 2.3   Thyroid Function Tests: Recent Labs    09/02/19 0451  TSH 0.288*   Anemia Panel: Recent Labs    09/03/19 0520 09/04/19 0435  FERRITIN 167 183   Sepsis Labs: No results for input(s): PROCALCITON, LATICACIDVEN in the last 168 hours.  No results found for this or any previous visit (from the past 240 hour(s)).       Radiology Studies: CT HEAD WO CONTRAST  Result Date: 09/03/2019 CLINICAL DATA:  Stroke-like symptoms EXAM: CT HEAD WITHOUT CONTRAST CT CERVICAL SPINE WITHOUT CONTRAST TECHNIQUE: Multidetector CT imaging of the head and cervical spine was performed following the standard protocol without intravenous contrast. Multiplanar CT image reconstructions of the cervical spine were also generated. COMPARISON:  08/14/2005 FINDINGS: CT HEAD FINDINGS Brain: Mild atrophic changes are noted. No findings to suggest acute hemorrhage, acute infarction or space-occupying mass lesion are seen. Vascular: No hyperdense vessel or unexpected calcification. Skull: Normal. Negative for fracture or focal lesion. Sinuses/Orbits: No acute finding. Other: None. CT CERVICAL SPINE FINDINGS Alignment: Within normal limits. Skull base and vertebrae: 7 cervical segments are well visualized. There are interbody fusion changes at C3-4 with anterior fixation. Additionally changes of prior fusion at C4-5, C5-6 and C6-7 are noted. Mild facet  hypertrophic changes are noted. No acute fracture or acute facet abnormality is seen. Soft tissues and spinal canal: Surrounding soft tissue structures are within normal limits. Upper chest: Visualized lung apices are unremarkable. Other: None IMPRESSION: CT of the head: Chronic changes without acute abnormality. CT of cervical spine: Degenerative and postsurgical changes without acute abnormality. Electronically Signed   By: Inez Catalina M.D.   On: 09/03/2019 17:05   CT CERVICAL SPINE WO CONTRAST  Result Date: 09/03/2019 CLINICAL DATA:  Stroke-like symptoms EXAM: CT HEAD WITHOUT CONTRAST CT CERVICAL SPINE WITHOUT CONTRAST TECHNIQUE: Multidetector CT imaging of the head and cervical spine was performed following the standard protocol without intravenous contrast. Multiplanar  CT image reconstructions of the cervical spine were also generated. COMPARISON:  08/14/2005 FINDINGS: CT HEAD FINDINGS Brain: Mild atrophic changes are noted. No findings to suggest acute hemorrhage, acute infarction or space-occupying mass lesion are seen. Vascular: No hyperdense vessel or unexpected calcification. Skull: Normal. Negative for fracture or focal lesion. Sinuses/Orbits: No acute finding. Other: None. CT CERVICAL SPINE FINDINGS Alignment: Within normal limits. Skull base and vertebrae: 7 cervical segments are well visualized. There are interbody fusion changes at C3-4 with anterior fixation. Additionally changes of prior fusion at C4-5, C5-6 and C6-7 are noted. Mild facet hypertrophic changes are noted. No acute fracture or acute facet abnormality is seen. Soft tissues and spinal canal: Surrounding soft tissue structures are within normal limits. Upper chest: Visualized lung apices are unremarkable. Other: None IMPRESSION: CT of the head: Chronic changes without acute abnormality. CT of cervical spine: Degenerative and postsurgical changes without acute abnormality. Electronically Signed   By: Inez Catalina M.D.   On: 09/03/2019  17:05        Scheduled Meds:  levothyroxine  150 mcg Oral QAC breakfast   mirabegron ER  25 mg Oral Daily   rivaroxaban  15 mg Oral BID WC   Followed by   Derrill Memo ON 09/15/2019] rivaroxaban  20 mg Oral Q supper   sodium chloride flush  3 mL Intravenous Q12H   Continuous Infusions:  sodium chloride     famotidine (PEPCID) IV 20 mg (09/04/19 1043)     LOS: 14 days    Time spent: over 30 min    Fayrene Helper, MD [facility] Pager AMION  If 7PM-7AM, please contact night-coverage [email or web address] Password [facility] 09/04/2019, 4:31 PM

## 2019-09-04 NOTE — Plan of Care (Signed)
BP (!) 152/94 (BP Location: Left Arm)   Pulse 73   Temp 98.3 F (36.8 C) (Oral)   Resp 18   Ht 5\' 7"  (1.702 m)   Wt 136.1 kg   SpO2 100%   BMI 46.99 kg/m   POC reviewed with patient,able to self reposition in bed, vital signs closely monitored and any concerns addressed at this time. Ambulates with front will walker.     Vital Signs MEWS/VS Documentation       09/04/2019 0415 09/04/2019 0800 09/04/2019 1619 09/04/2019 2000   MEWS Score:  0  0  0  0   MEWS Score Color:  Green  Green  Green  Green   Resp:  18  18  --  18   Pulse:  70  (!) 53  (!) 59  73   BP:  119/72  122/69  (!) 143/71  (!) 152/94   Temp:  98.1 F (36.7 C)  98.5 F (36.9 C)  99.1 F (37.3 C)  98.3 F (36.8 C)   O2 Device:  --  Room Air  Room Air  Room Air   Level of Consciousness:  Alert  --  Alert  Alert         Intake/Output Summary (Last 24 hours) at 09/04/2019 2226 Last data filed at 09/04/2019 2000 Gross per 24 hour  Intake 410 ml  Output 2 ml  Net 408 ml       Natasha [name] Natasha [name] 09/04/2019,10:26 PM

## 2019-09-05 ENCOUNTER — Inpatient Hospital Stay (HOSPITAL_COMMUNITY): Payer: Medicare HMO

## 2019-09-05 LAB — CBC WITH DIFFERENTIAL/PLATELET
Abs Immature Granulocytes: 0.07 10*3/uL (ref 0.00–0.07)
Basophils Absolute: 0 10*3/uL (ref 0.0–0.1)
Basophils Relative: 0 %
Eosinophils Absolute: 0.3 10*3/uL (ref 0.0–0.5)
Eosinophils Relative: 3 %
HCT: 36.7 % (ref 36.0–46.0)
Hemoglobin: 11.7 g/dL — ABNORMAL LOW (ref 12.0–15.0)
Immature Granulocytes: 1 %
Lymphocytes Relative: 22 %
Lymphs Abs: 2.1 10*3/uL (ref 0.7–4.0)
MCH: 28.8 pg (ref 26.0–34.0)
MCHC: 31.9 g/dL (ref 30.0–36.0)
MCV: 90.4 fL (ref 80.0–100.0)
Monocytes Absolute: 1.1 10*3/uL — ABNORMAL HIGH (ref 0.1–1.0)
Monocytes Relative: 12 %
Neutro Abs: 5.8 10*3/uL (ref 1.7–7.7)
Neutrophils Relative %: 62 %
Platelets: 232 10*3/uL (ref 150–400)
RBC: 4.06 MIL/uL (ref 3.87–5.11)
RDW: 16.2 % — ABNORMAL HIGH (ref 11.5–15.5)
WBC: 9.4 10*3/uL (ref 4.0–10.5)
nRBC: 0 % (ref 0.0–0.2)

## 2019-09-05 LAB — COMPREHENSIVE METABOLIC PANEL
ALT: 16 U/L (ref 0–44)
AST: 12 U/L — ABNORMAL LOW (ref 15–41)
Albumin: 3 g/dL — ABNORMAL LOW (ref 3.5–5.0)
Alkaline Phosphatase: 75 U/L (ref 38–126)
Anion gap: 8 (ref 5–15)
BUN: 14 mg/dL (ref 8–23)
CO2: 26 mmol/L (ref 22–32)
Calcium: 8.6 mg/dL — ABNORMAL LOW (ref 8.9–10.3)
Chloride: 102 mmol/L (ref 98–111)
Creatinine, Ser: 0.75 mg/dL (ref 0.44–1.00)
GFR calc Af Amer: >60 mL/min (ref >60)
GFR calc non Af Amer: >60 mL/min (ref >60)
Glucose, Bld: 89 mg/dL (ref 70–99)
Potassium: 4 mmol/L (ref 3.5–5.1)
Sodium: 136 mmol/L (ref 135–145)
Total Bilirubin: 0.7 mg/dL (ref 0.3–1.2)
Total Protein: 6.1 g/dL — ABNORMAL LOW (ref 6.5–8.1)

## 2019-09-05 LAB — FERRITIN: Ferritin: 178 ng/mL (ref 11–307)

## 2019-09-05 LAB — D-DIMER, QUANTITATIVE: D-Dimer, Quant: 0.52 ug/mL-FEU — ABNORMAL HIGH (ref 0.00–0.50)

## 2019-09-05 LAB — MAGNESIUM: Magnesium: 2.2 mg/dL (ref 1.7–2.4)

## 2019-09-05 LAB — PHOSPHORUS: Phosphorus: 3.3 mg/dL (ref 2.5–4.6)

## 2019-09-05 LAB — C-REACTIVE PROTEIN: CRP: 1.6 mg/dL — ABNORMAL HIGH (ref <1.0)

## 2019-09-05 MED ORDER — AMLODIPINE BESYLATE 5 MG PO TABS
5.0000 mg | ORAL_TABLET | Freq: Every day | ORAL | Status: DC
  Administered 2019-09-05: 5 mg via ORAL
  Filled 2019-09-05: qty 1

## 2019-09-05 NOTE — Progress Notes (Signed)
PROGRESS NOTE    Natasha [name]  [id number] DOB: October 21, 1950 DOA: 08/21/2019 PCP: Lucianne Lei, MD  Brief Narrative: Patient is 69 year old female with history of hypertension, hypothyroidism, history of left lower extremity DVT for which she was treated with Xarelto, decreased mobility with multiple back problems and spinal disease who presented to the emergency room with fever, cough and shortness of breath for about 2 weeks.  She was originally diagnosed with COVID-19 on 08/17/2019 at urgent care and treated empirically with dexamethasone as outpatient as she was not hypoxic.  Patient came to the emergency room with saturation of 80%, tachypneic, afebrile.  CT angios showed acute pulmonary embolism with moderate clot burden and bilateral multifocal infiltrates.  Started on IV heparin and admitted to [facility]. Remains in the hospital waiting to go to SNF.   Assessment & Plan:   Principal Problem:   Acute pulmonary embolism (HCC) Active Problems:   HYPOTHYROIDISM, POSTSURGICAL   Hypertension   Pulmonary embolism (HCC)   Acute respiratory failure with hypoxia (HCC)   Obesity, Class III, BMI 40-49.9 (morbid obesity) ([place])   Acute respiratory failure with hypoxia, multifactorial in the setting of COVID-19 pneumonia and pulmonary embolism: Respiratory symptoms improving.  Continue chest physiotherapy.  Stable today on RA Finished remdesivir therapy for 5 days.  Finished 10 days of steroid therapy.  Mostly improved.  Has some pleuritic pain, improved with tramadol.  Acute pulmonary embolism: With history of thromboembolism.  Probably aggravated by acute viral infection, however given second episode of thromboembolism and moderate clot burden, patient will likely benefit with lifelong anticoagulation. Initially treated with heparin infusion and now changed to Xarelto.  Echo with low normal RV systolic function -> follow outpatient LE [place] without VTE  Chest Pain: pt  describes CP overnight that improved after she got up and walked.  Described as pressure.  Some CP with palpation. EKG appears similar to prior, isolated T wave inversion in lead III.  Troponin negative x2.  ACS unlikely.  Possible musculoskeletal or GI?  CXR with improved R base aeration, no acute superimposed process. Will continue to monitor Trial pepcid - symptoms improved with pepcid, sx likely 2/2 reflux  Right arm and shoulder numbness  RUE weakness: present x ~3 days per patient.  Numbness to 4th and 5th finger and palm.  She also has weakness in her RUE as well and some numbness to sole of R foot.  Hx cervical fusion after accident years ago, though she notes that these symptoms are new. Follow MRI brain and C spine - she declined this as is claustrophobic and only does open MRI's.   CT head and neck unrevealing Repeat CT negative - discussed with neurology - as pt declining MRI here, will need to follow up outpatient.  Possibly related to previous hx of cervical spine injury?  Will need outpatient follow up, possibly nerve conduction study and EMG.    Hypotension: improved.    Sinus bradycardia: Mostly at sleep.  She has sleep apnea.  Unable to use CPAP in the hospital.  Not on any AV nodal blockers.  Seen by cardiology.  They recommended outpatient follow-up and ambulatory monitor.  Cardiology to schedule Jennylee Uehara follow-up.  Hypertension: BP starting to rise, restart amlodipine.  Follow.  Hypothyroidism: Continue Synthroid.  Recheck TSH as outpatient.  Sleep apnea: Obstructive sleep apnea.  Will use CPAP after discharge home.  Debility: Patient has significant physical debility and aggravated by current infection.  She will benefit with inpatient therapies at  [facility].   DVT prophylaxis: xarelto Code Status: full  Family Communication: none at bedside Disposition Plan: pending SNF placement   Consultants:   cardiology  Procedures:  LE [place] Summary: Right:  There is no evidence of deep vein thrombosis in the lower extremity. No cystic structure found in the popliteal fossa. Left: There is no evidence of deep vein thrombosis in the lower extremity. No cystic structure found in the popliteal fossa.  Echo IMPRESSIONS    1. Left ventricular ejection fraction, by visual estimation, is 65 to 70%. The left ventricle has hyperdynamic function. There is no left ventricular hypertrophy.  2. The left ventricle has no regional wall motion abnormalities.  3. Global right ventricle has low normal systolic function.The right ventricular size is mildly enlarged. No increase in right ventricular wall thickness.  4. Presence of pericardial fat pad.  5. The mitral valve is grossly normal. Trivial mitral valve regurgitation.  6. The tricuspid valve is grossly normal. Tricuspid valve regurgitation is not demonstrated.  7. The tricuspid valve was normal in structure. Tricuspid valve regurgitation is not demonstrated.  8. The aortic valve is grossly normal. Aortic valve regurgitation is not visualized. No evidence of aortic valve sclerosis or stenosis.  9. The inferior vena cava is dilated in size with <50% respiratory variability, suggesting right atrial pressure of 15 mmHg. 10. Technically challenging study. Normal LV function. RV appears to be mildly enlarged, with borderline normal function. Unable to visualize PA well. 11. The interatrial septum was not assessed.  Antimicrobials:  Anti-infectives (From admission, onward)   Start     Dose/Rate Route Frequency Ordered Stop   08/22/19 1000  remdesivir 100 mg in sodium chloride 0.9 % 100 mL IVPB     100 mg 200 mL/hr over 30 Minutes Intravenous Daily 08/21/19 2255 08/25/19 1005   08/21/19 2300  remdesivir 200 mg in sodium chloride 0.9% 250 mL IVPB     200 mg 580 mL/hr over 30 Minutes Intravenous Once 08/21/19 2255 08/22/19 0311     Subjective: Overall better No new complaints  Objective: Vitals:    09/04/19 2000 09/05/19 0513 09/05/19 0800 09/05/19 1550  BP: (!) 152/94 135/86 111/75 (!) 146/83  Pulse: 73 (!) 58 64 62  Resp: 18 18 18 18   Temp: 98.3 F (36.8 C) 98.6 F (37 C) 97.8 F (36.6 C) 97.7 F (36.5 C)  TempSrc: Oral Oral Oral Oral  SpO2: 100% 97% 98% 97%  Weight:      Height:        Intake/Output Summary (Last 24 hours) at 09/05/2019 1905 Last data filed at 09/05/2019 1200 Gross per 24 hour  Intake 600 ml  Output 1 ml  Net 599 ml   Filed Weights   08/21/19 1827  Weight: 136.1 kg    Examination:  General: No acute distress. Cardiovascular: RRR Lungs: Clear to auscultation bilaterally  Abdomen: Soft, nontender, nondistended Neurological: Alert and oriented 3. Moves all extremities 4.  4/5 strength to RUE.  Decreased sensation to 4th and 5th finger on R hand, R sole. Cranial nerves II through XII grossly intact. Skin: Warm and dry. No rashes or lesions. Extremities: No clubbing or cyanosis. No edema.   Data Reviewed: I have personally reviewed following labs and imaging studies  CBC: Recent Labs  Lab 09/01/19 1135 09/02/19 0451 09/03/19 0520 09/04/19 0435 09/05/19 0500  WBC 12.2* 11.4* 9.1 8.8 9.4  NEUTROABS  --  6.5 5.3 5.0 5.8  HGB 12.4 13.3 12.1 12.2 11.7*  HCT 38.3 41.9 37.4 37.7 36.7  MCV 90.5 90.9 89.5 90.4 90.4  PLT 250 268 259 235 [id number]   Basic Metabolic Panel: Recent Labs  Lab 09/01/19 1135 09/02/19 0451 09/03/19 0520 09/04/19 0435 09/05/19 0500  NA 136 139 139 137 136  K 3.9 4.3 4.0 4.1 4.0  CL 104 101 103 100 102  CO2 24 26 27 27 26   GLUCOSE 82 75 93 85 89  BUN 31* 23 15 16 14   CREATININE 1.04* 0.86 0.73 0.58 0.75  CALCIUM 8.4* 8.4* 8.6* 8.5* 8.6*  MG  --  2.3 2.2 2.2 2.2  PHOS  --  3.7 3.5 3.3 3.3   GFR: Estimated Creatinine Clearance: 97.1 mL/min (by C-G formula based on SCr of 0.75 mg/dL). Liver Function Tests: Recent Labs  Lab 09/01/19 1135 09/02/19 0451 09/03/19 0520 09/04/19 0435 09/05/19 0500  AST 11* 11* 11*  12* 12*  ALT 17 16 18 18 16   ALKPHOS 78 89 81 82 75  BILITOT 0.8 0.9 1.1 1.2 0.7  PROT 5.5* 5.9* 5.9* 5.8* 6.1*  ALBUMIN 2.9* 3.2* 3.2* 3.1* 3.0*   Recent Labs  Lab 09/02/19 0451  LIPASE 27   No results for input(s): AMMONIA in the last 168 hours. Coagulation Profile: No results for input(s): INR, PROTIME in the last 168 hours. Cardiac Enzymes: No results for input(s): CKTOTAL, CKMB, CKMBINDEX, TROPONINI in the last 168 hours. BNP (last 3 results) No results for input(s): PROBNP in the last 8760 hours. HbA1C: No results for input(s): HGBA1C in the last 72 hours. CBG: No results for input(s): GLUCAP in the last 168 hours. Lipid Profile: Recent Labs    09/04/19 1230  CHOL 174  HDL 76  LDLCALC 80  TRIG 89  CHOLHDL 2.3   Thyroid Function Tests: No results for input(s): TSH, T4TOTAL, FREET4, T3FREE, THYROIDAB in the last 72 hours. Anemia Panel: Recent Labs    09/04/19 0435 09/05/19 0500  FERRITIN 183 178   Sepsis Labs: No results for input(s): PROCALCITON, LATICACIDVEN in the last 168 hours.  No results found for this or any previous visit (from the past 240 hour(s)).       Radiology Studies: CT HEAD WO CONTRAST  Result Date: 09/05/2019 CLINICAL DATA:  Focal neural deficit. EXAM: CT HEAD WITHOUT CONTRAST TECHNIQUE: Contiguous axial images were obtained from the base of the skull through the vertex without intravenous contrast. COMPARISON:  September 03, 2019. FINDINGS: Brain: Minimal diffuse cortical atrophy is noted. No mass effect or midline shift is noted. Ventricular size is within normal limits. There is no evidence of mass lesion, hemorrhage or acute infarction. Vascular: No hyperdense vessel or unexpected calcification. Skull: Normal. Negative for fracture or focal lesion. Sinuses/Orbits: No acute finding. Other: None. IMPRESSION: Minimal diffuse cortical atrophy. No acute intracranial abnormality seen. Electronically Signed   By: Marijo Conception M.D.   On:  09/05/2019 10:32        Scheduled Meds: . famotidine  20 mg Oral BID  . levothyroxine  150 mcg Oral QAC breakfast  . mirabegron ER  25 mg Oral Daily  . rivaroxaban  15 mg Oral BID WC   Followed by  . [START ON 09/15/2019] rivaroxaban  20 mg Oral Q supper  . sodium chloride flush  3 mL Intravenous Q12H   Continuous Infusions: . sodium chloride       LOS: 15 days    Time spent: over 30 min    Fayrene Helper, MD [facility] Pager AMION  If  7PM-7AM, please contact night-coverage [email or web address] Password [facility] 09/05/2019, 7:05 PM

## 2019-09-05 NOTE — Plan of Care (Signed)
BP (!) 146/83 (BP Location: Left Arm)   Pulse 62   Temp 97.7 F (36.5 C) (Oral)   Resp 18   Ht 5\' 7"  (1.702 m)   Wt 136.1 kg   SpO2 97%   BMI 46.99 kg/m     Vital Signs MEWS/VS Documentation       09/04/2019 2000 09/05/2019 0513 09/05/2019 0800 09/05/2019 1550   MEWS Score:  0  0  0  0   MEWS Score Color:  Green  Green  Green  Green   Resp:  18  18  18  18    Pulse:  73  (!) 58  64  62   BP:  (!) 152/94  135/86  111/75  (!) 146/83   Temp:  98.3 F (36.8 C)  98.6 F (37 C)  97.8 F (36.6 C)  97.7 F (36.5 C)   O2 Device:  Room Air  Room Biomedical scientist   Level of Consciousness:  Alert  Alert  Alert  --       POC reviewed with patient, walks with front wheel walker,  x1 assist.       Natasha [name] 09/05/2019,7:30 PM

## 2019-09-06 DIAGNOSIS — R5381 Other malaise: Secondary | ICD-10-CM | POA: Diagnosis not present

## 2019-09-06 DIAGNOSIS — I2699 Other pulmonary embolism without acute cor pulmonale: Secondary | ICD-10-CM | POA: Diagnosis not present

## 2019-09-06 DIAGNOSIS — G4733 Obstructive sleep apnea (adult) (pediatric): Secondary | ICD-10-CM | POA: Diagnosis not present

## 2019-09-06 DIAGNOSIS — M2550 Pain in unspecified joint: Secondary | ICD-10-CM | POA: Diagnosis not present

## 2019-09-06 DIAGNOSIS — I10 Essential (primary) hypertension: Secondary | ICD-10-CM | POA: Diagnosis not present

## 2019-09-06 DIAGNOSIS — Z6841 Body Mass Index (BMI) 40.0 and over, adult: Secondary | ICD-10-CM | POA: Diagnosis not present

## 2019-09-06 DIAGNOSIS — Z7401 Bed confinement status: Secondary | ICD-10-CM | POA: Diagnosis not present

## 2019-09-06 DIAGNOSIS — U071 COVID-19: Secondary | ICD-10-CM | POA: Diagnosis not present

## 2019-09-06 DIAGNOSIS — G5621 Lesion of ulnar nerve, right upper limb: Secondary | ICD-10-CM | POA: Diagnosis not present

## 2019-09-06 DIAGNOSIS — J9601 Acute respiratory failure with hypoxia: Secondary | ICD-10-CM | POA: Diagnosis not present

## 2019-09-06 DIAGNOSIS — E039 Hypothyroidism, unspecified: Secondary | ICD-10-CM | POA: Diagnosis not present

## 2019-09-06 DIAGNOSIS — M6281 Muscle weakness (generalized): Secondary | ICD-10-CM | POA: Diagnosis not present

## 2019-09-06 MED ORDER — RIVAROXABAN 20 MG PO TABS: 20.0000 mg | ORAL_TABLET | Freq: Every day | ORAL | 0 refills | Status: DC

## 2019-09-06 MED ORDER — FAMOTIDINE 20 MG PO TABS: 20.0000 mg | ORAL_TABLET | Freq: Two times a day (BID) | ORAL | 0 refills | Status: DC

## 2019-09-06 MED ORDER — RIVAROXABAN 15 MG PO TABS: 15.0000 mg | ORAL_TABLET | Freq: Two times a day (BID) | ORAL | 0 refills | Status: DC

## 2019-09-06 NOTE — Progress Notes (Signed)
Patient states she came with UGGs and they are now lost. Called to the 1st floor to see if they have found any shoes, they said no.  No documentation available regarding the patient arriving with UGGs.

## 2019-09-06 NOTE — Discharge Summary (Signed)
Physician Discharge Summary  Natasha [name] [id number] DOB: 04-05-1951 DOA: 08/21/2019  PCP: Lucianne Lei, MD  Admit date: 08/21/2019 Discharge date: 09/06/2019  Time spent: 45 minutes  Recommendations for Outpatient Follow-up:  1. Follow outpatient CBC/CMP 2. Continue quarantine until 09/08/19.  Needs 24 hours without symptoms without using fever reducing medications to d/c quarantine.  3. Continue xarelto 15 mg BID until 1/12, on 1/13 will start 20 mg daily - continue indefinitely given hx of VTE - needs at least 3 months uninterrupted, then likely indefinitely 4. Follow repeat echo outpatient for low normal RV systolic function 5. Follow with neurology outpatient, consider MRI and EMG/NCS outpatient 6. Follow blood pressure outpatient.  Holding valstartan-HCTZ.  Follow BP to see if this needs to be resumed.  7. Follow with cardiology outpatient for bradycardia 8. Follow TSH outpatient 9. Follow CXR outpatient 10. Follow pulm nodule outpatient   Discharge Diagnoses:  Principal Problem:   Acute pulmonary embolism (HCC) Active Problems:   HYPOTHYROIDISM, POSTSURGICAL   Hypertension   Pulmonary embolism (HCC)   Acute respiratory failure with hypoxia (HCC)   Obesity, Class III, BMI 40-49.9 (morbid obesity) ([place])   Discharge Condition: stable  Diet recommendation: heart healthy  Filed Weights   08/21/19 1827  Weight: 136.1 kg    History of present illness:  Patient is 69 year old female with history of hypertension, hypothyroidism, history of left lower extremity DVT for which she was treated with Xarelto, decreased mobility with multiple back problems and spinal disease who presented to the emergency room with fever, cough and shortness of breath for about 2 weeks. She was originally diagnosed with COVID-19 on 08/17/2019 at urgent care and treated empirically with dexamethasone as outpatient as she was not hypoxic. Patient came to the emergency room with saturation of 80%,  tachypneic, afebrile. CT angios showed acute pulmonary embolism with moderate clot burden and bilateral multifocal infiltrates. Started on IV heparin and admitted to [facility]. Remains in the hospital waiting to go to SNF.  She was admitted for COVID pneumonia.  She was treated with steroids and remdesivir.  She was also noted to have Byrdie Miyazaki submassive PE.  She has been started on xarelto.  She noted RUE weakness and numbness as well as RLE numbness.  She's had Zelig Gacek hx of c spine injury.  She had negative head CT x 2 for CVA.  Case was discussed with neurology recommending outpatient follow up.  She's improved and stable for discharge on 1/4.  See below for further details regarding hospitalization.  Hospital Course:  Acute respiratory failure with hypoxia, multifactorial in the setting of COVID-19 pneumonia and pulmonary embolism: Respiratory symptoms improving.Continue chest physiotherapy.  Stable today on RA Finished remdesivir therapy for 5 days.  Finished 10 days of steroid therapy.  Mostly improved. Has some pleuritic pain, improved with tramadol.  Acute pulmonary embolism:With history of thromboembolism. Probably aggravated by acute viral infection, however given second episode of thromboembolism and moderate clot burden, patient will likely benefit with lifelong anticoagulation. Initially treated with heparin infusion and now changed to Xarelto.  Echo with low normal RV systolic function -> follow outpatient LE [place] without VTE  Chest Pain: pt describes CP overnight that improved after she got up and walked.  Described as pressure.  Some CP with palpation. EKG appears similar to prior, isolated T wave inversion in lead III.  Troponin negative x2.  ACS unlikely.  Possible musculoskeletal or GI?  CXR with improved R base aeration, no acute superimposed process. Will  continue to monitor Trial pepcid - symptoms improved with pepcid, sx likely 2/2 reflux  Right arm and shoulder  numbness  RUE weakness: present x ~3 days per patient.  Numbness to 4th and 5th finger and palm.  She also has weakness in her RUE as well and some numbness to sole of R foot.  Hx cervical fusion after accident years ago, though she notes that these symptoms are new. Follow MRI brain and C spine - she declined this as is claustrophobic and only does open MRI's.   CT head and neck unrevealing Repeat CT negative - discussed with neurology - as pt declining MRI here, will need to follow up outpatient.  Possibly related to previous hx of cervical spine injury?  Will need outpatient follow up, possibly nerve conduction study and EMG.    Hypotension: improved.    Sinus bradycardia: Mostly at sleep. She has sleep apnea. Unable to use CPAP in the hospital. Not on any AV nodal blockers. Seen by cardiology. They recommended outpatient follow-up and ambulatory monitor. Cardiology to schedule Yoniel Arkwright follow-up.  Hypertension:BP starting to rise, restart amlodipine.  Follow.  Hypothyroidism:Continue Synthroid. Recheck TSH as outpatient.  Sleep apnea: Obstructive sleep apnea. Will use CPAP after discharge home.  Debility:Patient has significant physical debility and aggravated by current infection. She will benefit with inpatient therapies at [facility].  Procedures: LE [place] Summary: Right: There is no evidence of deep vein thrombosis in the lower extremity. No cystic structure found in the popliteal fossa. Left: There is no evidence of deep vein thrombosis in the lower extremity. No cystic structure found in the popliteal fossa.   Echo IMPRESSIONS    1. Left ventricular ejection fraction, by visual estimation, is 65 to 70%. The left ventricle has hyperdynamic function. There is no left ventricular hypertrophy.  2. The left ventricle has no regional wall motion abnormalities.  3. Global right ventricle has low normal systolic function.The right ventricular size is mildly  enlarged. No increase in right ventricular wall thickness.  4. Presence of pericardial fat pad.  5. The mitral valve is grossly normal. Trivial mitral valve regurgitation.  6. The tricuspid valve is grossly normal. Tricuspid valve regurgitation is not demonstrated.  7. The tricuspid valve was normal in structure. Tricuspid valve regurgitation is not demonstrated.  8. The aortic valve is grossly normal. Aortic valve regurgitation is not visualized. No evidence of aortic valve sclerosis or stenosis.  9. The inferior vena cava is dilated in size with <50% respiratory variability, suggesting right atrial pressure of 15 mmHg. 10. Technically challenging study. Normal LV function. RV appears to be mildly enlarged, with borderline normal function. Unable to visualize PA well. 11. The interatrial septum was not assessed.  Consultations:  none  Discharge Exam: Vitals:   09/06/19 0500 09/06/19 0906  BP: 111/76 120/74  Pulse: 73 69  Resp: 20 16  Temp: 97.8 F (36.6 C) 97.9 F (36.6 C)  SpO2: 93% 94%   Feels better Ready for discharge Appreciative   General: No acute distress. Cardiovascular: Heart sounds show Hamzah Savoca regular rate, and rhythm.  Lungs: Clear to auscultation bilaterally Abdomen: Soft, nontender, nondistended  Neurological: Alert and oriented 3. Moves all extremities 4. 4/5 strength to RUE, decreased sensation to 4th and 5th finger on R hand, to sole as well (improving per pt report).  Cranial nerves II through XII grossly intact. Skin: Warm and dry. No rashes or lesions. Extremities: No clubbing or cyanosis. No edema.  Discharge Instructions  Discharge Instructions    Diet - low sodium heart healthy   Complete by: As directed    Discharge instructions   Complete by: As directed    You were seen for COVID 19 pneumonia.  You've improved with steroids and remdesivir.    Your quarantine should continue for 21 days after your symptoms started.  Your quarantine can be  discontinued on 09/08/19 as long as your symptoms have resolved without the use of fever reducing medications for 24 hours.   You were found to have blood clots in your lung.  This is being treated with xarelto.  You should continue the 15 mg twice daily dosing for another 9 days and then on 1/13, you'll start the 20 mg daily dose.  Since this is the second time you've had Zeek Rostron blood clot, this should be continued indefinitely (or until the risk outweighs the benefit).  You will need Shantina Chronister repeat echocardiogram in [place] few months outpatient to follow your right ventricular systolic function.    You will need to follow with cardiology outpatient for your slow heart rate.  Your neurologic symptoms could be related to your previous cervical spine injury.  You should pursue an outpatient MRI.  Please follow up with your neurologist.  If you have new or more concerning symptoms, please call your doctor or come to the ED.  If your symptoms persist, please follow an outpatient EMG/NCS.  We decreased your blood pressure medications.  Please follow this outpatient with your PCP and adjust further as needed.  You had Yahsir Wickens pulmonary nodule that appears benign.  Follow this with your PCP outpatient.   Return for new, recurrent, or worsening symptoms.  Please ask your PCP to request records from this hospitalization so they know what was done and what the next steps will be.   Increase activity slowly   Complete by: As directed      Allergies as of 09/06/2019      Reactions   Codeine Shortness Of Breath, Palpitations   Eggs Or Egg-derived Products Nausea And Vomiting   PROJECTILE VOMITING      Medication List    STOP taking these medications   benzonatate 200 MG capsule Commonly known as: TESSALON   doxycycline 100 MG capsule Commonly known as: VIBRAMYCIN   valsartan-hydrochlorothiazide 160-12.5 MG tablet Commonly known as: DIOVAN-HCT     TAKE these medications   albuterol 108 (90 Base) MCG/ACT  inhaler Commonly known as: VENTOLIN HFA Inhale 1-2 puffs into the lungs every 6 (six) hours as needed for wheezing or shortness of breath.   amLODipine 5 MG tablet Commonly known as: NORVASC Take 5 mg by mouth at bedtime.   famotidine 20 MG tablet Commonly known as: PEPCID Take 1 tablet (20 mg total) by mouth 2 (two) times daily.   fexofenadine 180 MG tablet Commonly known as: ALLEGRA Take 180 mg by mouth daily.   furosemide 20 MG tablet Commonly known as: LASIX Start by taking 1 tablet every other day. What changed:   how much to take  how to take this  when to take this  reasons to take this  additional instructions   levothyroxine 150 MCG tablet Commonly known as: SYNTHROID Take 150 mcg by mouth daily.   Mucinex DM Maximum Strength 60-1200 MG Tb12 Take 1 tablet by mouth every 12 (twelve) hours.   multivitamin with minerals Tabs tablet Take 1 tablet by mouth daily.   Myrbetriq 25 MG Tb24 tablet Generic drug: mirabegron ER Take 25  mg by mouth daily.   Rivaroxaban 15 MG Tabs tablet Commonly known as: XARELTO Take 1 tablet (15 mg total) by mouth 2 (two) times daily with Naasia Weilbacher meal for 9 days.   rivaroxaban 20 MG Tabs tablet Commonly known as: XARELTO Take 1 tablet (20 mg total) by mouth daily with supper. Start taking on: September 15, 2019      Allergies  Allergen Reactions  . Codeine Shortness Of Breath and Palpitations  . Eggs Or Egg-Derived Products Nausea And Vomiting    PROJECTILE VOMITING   Contact information for after-discharge care    [facility] .   Service: Skilled Nursing Contact information: [address] [phone]               The results of significant diagnostics from this hospitalization (including imaging, microbiology, ancillary and laboratory) are listed below for reference.    Significant Diagnostic Studies: DG Chest 2 View  Result Date:  08/18/2019 CLINICAL DATA:  Shortness of breath, wheezing, cough EXAM: CHEST - 2 VIEW COMPARISON:  02/14/2018 FINDINGS: Cardiomegaly. Tortuosity of the thoracic aorta. Increased markings in the lung bases. No effusions or overt edema. No acute bony abnormality. IMPRESSION: Cardiomegaly. Increased markings in the lung bases could reflect atelectasis. Electronically Signed   By: Rolm Baptise M.D.   On: 08/18/2019 17:51   CT HEAD WO CONTRAST  Result Date: 09/05/2019 CLINICAL DATA:  Focal neural deficit. EXAM: CT HEAD WITHOUT CONTRAST TECHNIQUE: Contiguous axial images were obtained from the base of the skull through the vertex without intravenous contrast. COMPARISON:  September 03, 2019. FINDINGS: Brain: Minimal diffuse cortical atrophy is noted. No mass effect or midline shift is noted. Ventricular size is within normal limits. There is no evidence of mass lesion, hemorrhage or acute infarction. Vascular: No hyperdense vessel or unexpected calcification. Skull: Normal. Negative for fracture or focal lesion. Sinuses/Orbits: No acute finding. Other: None. IMPRESSION: Minimal diffuse cortical atrophy. No acute intracranial abnormality seen. Electronically Signed   By: Marijo Conception M.D.   On: 09/05/2019 10:32   CT HEAD WO CONTRAST  Result Date: 09/03/2019 CLINICAL DATA:  Stroke-like symptoms EXAM: CT HEAD WITHOUT CONTRAST CT CERVICAL SPINE WITHOUT CONTRAST TECHNIQUE: Multidetector CT imaging of the head and cervical spine was performed following the standard protocol without intravenous contrast. Multiplanar CT image reconstructions of the cervical spine were also generated. COMPARISON:  08/14/2005 FINDINGS: CT HEAD FINDINGS Brain: Mild atrophic changes are noted. No findings to suggest acute hemorrhage, acute infarction or space-occupying mass lesion are seen. Vascular: No hyperdense vessel or unexpected calcification. Skull: Normal. Negative for fracture or focal lesion. Sinuses/Orbits: No acute finding. Other:  None. CT CERVICAL SPINE FINDINGS Alignment: Within normal limits. Skull base and vertebrae: 7 cervical segments are well visualized. There are interbody fusion changes at C3-4 with anterior fixation. Additionally changes of prior fusion at C4-5, C5-6 and C6-7 are noted. Mild facet hypertrophic changes are noted. No acute fracture or acute facet abnormality is seen. Soft tissues and spinal canal: Surrounding soft tissue structures are within normal limits. Upper chest: Visualized lung apices are unremarkable. Other: None IMPRESSION: CT of the head: Chronic changes without acute abnormality. CT of cervical spine: Degenerative and postsurgical changes without acute abnormality. Electronically Signed   By: Inez Catalina M.D.   On: 09/03/2019 17:05   CT Angio Chest PE W and/or Wo Contrast  Result Date: 08/21/2019 CLINICAL DATA:  PE suspected, low/intermediate prob, positive  D-dimer covid+ COVID-19 positive 4 days ago, worsening shortness of breath, cough, and difficulty breathing. EXAM: CT ANGIOGRAPHY CHEST WITH CONTRAST TECHNIQUE: Multidetector CT imaging of the chest was performed using the standard protocol during bolus administration of intravenous contrast. Multiplanar CT image reconstructions and MIPs were obtained to evaluate the vascular anatomy. CONTRAST:  52mL OMNIPAQUE IOHEXOL 350 MG/ML SOLN COMPARISON:  Chest radiograph earlier this day. Chest CTA 02/15/2018, additional priors. FINDINGS: Cardiovascular: Positive for acute pulmonary embolus. There are filling defects in the distal right main pulmonary artery extending into upper, middle, and lower lobar branches. Segmental involvement in the upper and lower lobes. Segmental pulmonary emboli in the left lower lobe. Thromboembolic burden is moderate. While the RV to LV ratio is 1.15, patient had Shawnita Krizek similar RV to LV ratio on prior imaging prior to pulmonary embolus on CT last year. No aortic dissection. Upper normal heart size. No pericardial effusion.  Mediastinum/Nodes: No enlarged mediastinal or hilar lymph thyroidectomy. No esophageal wall thickening. Lungs/Pleura: Breathing motion artifact partially obscures evaluation. There are patchy ground-glass opacities throughout both lungs, mild in degree. Perifissural nodule in the right lung anteriorly, series 7, image 48, has slightly decreased in size from prior exam currently 7 mm, previously 9 mm, this is consistent with benign etiology. No pleural fluid. Trachea and mainstem bronchi are grossly patent, not well assessed given expiratory phase. Upper Abdomen: No acute abnormality. Musculoskeletal: Multilevel degenerative change throughout the spine. There are no acute or suspicious osseous abnormalities. Review of the MIP images confirms the above findings. IMPRESSION: 1. Positive for acute pulmonary embolus with moderate clot burden, primarily in the right lung. Thrombus involves the distal main pulmonary artery and extends into the lobar and segmental branches. While the RV to LV ratio is elevated, similar RV to LV ratio is seen on CT 18 months ago prior to pulmonary embolus suggesting this is chronic. 2. Mild patchy ground-glass opacities in both lungs consistent with COVID-19, mild parenchymal involvement. 3. Right pulmonary nodule has diminished in size from CT 18 months ago, currently 7 mm, previously 9 mm, consistent with benign etiology. Critical Value/emergent results were called by telephone at the time of interpretation on 08/21/2019 at 10:17 pm to Rake , who verbally acknowledged these results. Electronically Signed   By: Keith Rake M.D.   On: 08/21/2019 22:18   CT CERVICAL SPINE WO CONTRAST  Result Date: 09/03/2019 CLINICAL DATA:  Stroke-like symptoms EXAM: CT HEAD WITHOUT CONTRAST CT CERVICAL SPINE WITHOUT CONTRAST TECHNIQUE: Multidetector CT imaging of the head and cervical spine was performed following the standard protocol without intravenous contrast. Multiplanar CT image  reconstructions of the cervical spine were also generated. COMPARISON:  08/14/2005 FINDINGS: CT HEAD FINDINGS Brain: Mild atrophic changes are noted. No findings to suggest acute hemorrhage, acute infarction or space-occupying mass lesion are seen. Vascular: No hyperdense vessel or unexpected calcification. Skull: Normal. Negative for fracture or focal lesion. Sinuses/Orbits: No acute finding. Other: None. CT CERVICAL SPINE FINDINGS Alignment: Within normal limits. Skull base and vertebrae: 7 cervical segments are well visualized. There are interbody fusion changes at C3-4 with anterior fixation. Additionally changes of prior fusion at C4-5, C5-6 and C6-7 are noted. Mild facet hypertrophic changes are noted. No acute fracture or acute facet abnormality is seen. Soft tissues and spinal canal: Surrounding soft tissue structures are within normal limits. Upper chest: Visualized lung apices are unremarkable. Other: None IMPRESSION: CT of the head: Chronic changes without acute abnormality. CT of cervical spine: Degenerative and postsurgical changes  without acute abnormality. Electronically Signed   By: Inez Catalina M.D.   On: 09/03/2019 17:05   DG CHEST PORT 1 VIEW  Result Date: 09/02/2019 CLINICAL DATA:  Shortness of breath EXAM: PORTABLE CHEST 1 VIEW COMPARISON:  08/21/2019 FINDINGS: Midline trachea. Mild cardiomegaly. Tortuous thoracic aorta. No pleural effusion or pneumothorax. Chronic interstitial thickening. Further improvement in right base aeration. No well-defined lobar consolidation. IMPRESSION: Improved right base aeration, without acute superimposed process. Cardiomegaly without congestive failure. Electronically Signed   By: Abigail Miyamoto M.D.   On: 09/02/2019 12:42   DG Chest Port 1 View  Result Date: 08/21/2019 CLINICAL DATA:  Shortness of breath. EXAM: PORTABLE CHEST 1 VIEW COMPARISON:  08/18/2019 FINDINGS: Mild bibasilar opacities are again noted and stable to minimally improved. The  cardiomediastinal silhouette is unremarkable. No pleural effusion or pneumothorax. No new pulmonary opacities are present. IMPRESSION: Stable to slight improvement in bibasilar opacities. No other significant change. Electronically Signed   By: Margarette Canada M.D.   On: 08/21/2019 18:37   VAS [place] LOWER EXTREMITY VENOUS (DVT)  Result Date: 08/23/2019  Lower Venous Study Indications: D dimer.  Limitations: Body habitus, poor ultrasound/tissue interface and patient pain tolerance. Comparison Study: 07/15/18 previous Performing Technologist: Abram Sander RVS  Examination Guidelines: Nassir Neidert complete evaluation includes B-mode imaging, spectral Doppler, color Doppler, and power Doppler as needed of all accessible portions of each vessel. Bilateral testing is considered an integral part of Rowe Warman complete examination. Limited examinations for reoccurring indications may be performed as noted.  +---------+---------------+---------+-----------+----------+--------------+ RIGHT    CompressibilityPhasicitySpontaneityPropertiesThrombus Aging +---------+---------------+---------+-----------+----------+--------------+ CFV      Full           Yes      Yes                                 +---------+---------------+---------+-----------+----------+--------------+ SFJ      Full                                                        +---------+---------------+---------+-----------+----------+--------------+ FV Prox  Full                                                        +---------+---------------+---------+-----------+----------+--------------+ FV Mid   Full                                                        +---------+---------------+---------+-----------+----------+--------------+ FV DistalFull                                                        +---------+---------------+---------+-----------+----------+--------------+ PFV      Full                                                         +---------+---------------+---------+-----------+----------+--------------+  POP      Full           Yes      Yes                                 +---------+---------------+---------+-----------+----------+--------------+ PTV      Full                                                        +---------+---------------+---------+-----------+----------+--------------+ PERO                                                  Not visualized +---------+---------------+---------+-----------+----------+--------------+   +---------+---------------+---------+-----------+----------+--------------+ LEFT     CompressibilityPhasicitySpontaneityPropertiesThrombus Aging +---------+---------------+---------+-----------+----------+--------------+ CFV      Full           Yes      Yes                                 +---------+---------------+---------+-----------+----------+--------------+ SFJ      Full                                                        +---------+---------------+---------+-----------+----------+--------------+ FV Prox  Full                                                        +---------+---------------+---------+-----------+----------+--------------+ FV Mid                  Yes      Yes                                 +---------+---------------+---------+-----------+----------+--------------+ FV Distal               Yes      Yes                                 +---------+---------------+---------+-----------+----------+--------------+ PFV      Full                                                        +---------+---------------+---------+-----------+----------+--------------+ POP      Full           Yes      Yes                                 +---------+---------------+---------+-----------+----------+--------------+ PTV  Full                                                         +---------+---------------+---------+-----------+----------+--------------+ PERO                                                  Not visualized +---------+---------------+---------+-----------+----------+--------------+     Summary: Right: There is no evidence of deep vein thrombosis in the lower extremity. No cystic structure found in the popliteal fossa. Left: There is no evidence of deep vein thrombosis in the lower extremity. No cystic structure found in the popliteal fossa.  *See table(s) above for measurements and observations. Electronically signed by Curt Jews MD on 08/23/2019 at 5:38:22 PM.    Final    ECHOCARDIOGRAM LIMITED  Result Date: 08/22/2019   ECHOCARDIOGRAM LIMITED REPORT   Patient Name:   MEARLE AVILEZ Zywicki Date of Exam: 08/22/2019 Medical Rec #:  [id number]      Height:       67.0 in Accession #:    [id number]     Weight:       300.0 lb Date of Birth:  17-Jan-1951       BSA:          2.40 m Patient Age:    13 years       BP:           163/101 mmHg Patient Gender: F              HR:           70 bpm. Exam Location:  Inpatient  Procedure: Limited Echo, Limited Color Doppler and Cardiac Doppler Indications:    pulmonary embolus 415.19  History:        Patient has prior history of Echocardiogram examinations, most                 recent 02/02/2008.  Sonographer:    Johny Chess Referring PhysLW:[id number] TIMOTHY S OPYD  Sonographer Comments: Image acquisition challenging due to respiratory motion. IMPRESSIONS  1. Left ventricular ejection fraction, by visual estimation, is 65 to 70%. The left ventricle has hyperdynamic function. There is no left ventricular hypertrophy.  2. The left ventricle has no regional wall motion abnormalities.  3. Global right ventricle has low normal systolic function.The right ventricular size is mildly enlarged. No increase in right ventricular wall thickness.  4. Presence of pericardial fat pad.  5. The mitral valve is grossly normal. Trivial mitral valve  regurgitation.  6. The tricuspid valve is grossly normal. Tricuspid valve regurgitation is not demonstrated.  7. The tricuspid valve was normal in structure. Tricuspid valve regurgitation is not demonstrated.  8. The aortic valve is grossly normal. Aortic valve regurgitation is not visualized. No evidence of aortic valve sclerosis or stenosis.  9. The inferior vena cava is dilated in size with <50% respiratory variability, suggesting right atrial pressure of 15 mmHg. 10. Technically challenging study. Normal LV function. RV appears to be mildly enlarged, with borderline normal function. Unable to visualize PA well. 11. The interatrial septum was not assessed. FINDINGS  Left Ventricle: Left ventricular ejection fraction, by visual estimation, is 65 to 70%. The  left ventricle has hyperdynamic function. The left ventricle has no regional wall motion abnormalities. There is no increased left ventricular wall thickness. Left ventricular diastolic parameters were normal. Right Ventricle: The right ventricular size is mildly enlarged. Right vetricular wall thickness was not assessed. Global RV systolic function is has low normal systolic function. Left Atrium: Left atrial size was normal in size. Right Atrium: Right atrial size was normal in size. Right atrial pressure is estimated at 15 mmHg. Pericardium: Trivial pericardial effusion is present is seen. There is no evidence of pericardial effusion. Presence of pericardial fat pad. Mitral Valve: The mitral valve is grossly normal. MV Area by PHT, 2.69 cm. MV PHT, 81.78 msec. Trivial mitral valve regurgitation. Tricuspid Valve: The tricuspid valve is normal in structure. Tricuspid valve regurgitation is not demonstrated. Aortic Valve: The aortic valve is grossly normal. Aortic valve regurgitation is not visualized. The aortic valve is structurally normal, with no evidence of sclerosis or stenosis. Pulmonic Valve: The pulmonic valve was not well visualized. Pulmonic valve  regurgitation is not visualized by color flow Doppler. Pulmonic regurgitation is not visualized by color flow Doppler. Aorta: The aortic root and ascending aorta are structurally normal, with no evidence of dilitation. Pulmonary Artery: The pulmonary artery is not well seen. Venous: The inferior vena cava is dilated in size with less than 50% respiratory variability, suggesting right atrial pressure of 15 mmHg. Shunts: The interatrial septum was not assessed.  LEFT VENTRICLE          Normals PLAX 2D LVIDd:         4.40 cm  3.6 cm   Diastology                  Normals LVIDs:         2.40 cm  1.7 cm   LV e' lateral:   13.30 cm/s 6.42 cm/s LV PW:         1.10 cm  1.4 cm   LV E/e' lateral: 3.6        15.4 LV IVS:        1.00 cm  1.3 cm   LV e' medial:    9.79 cm/s  6.96 cm/s LVOT diam:     2.20 cm  2.0 cm   LV E/e' medial:  4.9        6.96 LV SV:         68 ml    79 ml LV SV Index:   25.86    45 ml/m2 LVOT Area:     3.80 cm 3.14 cm2  RIGHT VENTRICLE RV S prime:     17.70 cm/s TAPSE (M-mode): 3.2 cm LEFT ATRIUM         Index LA diam:    4.40 cm 1.83 cm/m  AORTIC VALVE             Normals LVOT Vmax:   86.80 cm/s LVOT Vmean:  65.400 cm/s 75 cm/s LVOT VTI:    0.207 m     25.3 cm  AORTA                 Normals Ao Root diam: 3.40 cm 31 mm Ao Asc diam:  3.50 cm 31 mm MITRAL VALVE              Normals MV Area (PHT): 2.69 cm             SHUNTS MV PHT:        81.78 msec 55 ms  Systemic VTI:  0.21 m MV Decel Time: 282 msec   187 ms    Systemic Diam: 2.20 cm MV E velocity: 48.00 cm/s 103 cm/s MV Keren Alverio velocity: 69.00 cm/s 70.3 cm/s MV E/Ariza Evans ratio:  0.70       1.5  Buford Dresser MD Electronically signed by Buford Dresser MD Signature Date/Time: 08/22/2019/2:59:16 PMThe mitral valve is grossly normal.    Final     Microbiology: No results found for this or any previous visit (from the past 240 hour(s)).   Labs: Basic Metabolic Panel: Recent Labs  Lab 09/01/19 1135 09/02/19 0451 09/03/19 0520 09/04/19 0435  09/05/19 0500  NA 136 139 139 137 136  K 3.9 4.3 4.0 4.1 4.0  CL 104 101 103 100 102  CO2 24 26 27 27 26   GLUCOSE 82 75 93 85 89  BUN 31* 23 15 16 14   CREATININE 1.04* 0.86 0.73 0.58 0.75  CALCIUM 8.4* 8.4* 8.6* 8.5* 8.6*  MG  --  2.3 2.2 2.2 2.2  PHOS  --  3.7 3.5 3.3 3.3   Liver Function Tests: Recent Labs  Lab 09/01/19 1135 09/02/19 0451 09/03/19 0520 09/04/19 0435 09/05/19 0500  AST 11* 11* 11* 12* 12*  ALT 17 16 18 18 16   ALKPHOS 78 89 81 82 75  BILITOT 0.8 0.9 1.1 1.2 0.7  PROT 5.5* 5.9* 5.9* 5.8* 6.1*  ALBUMIN 2.9* 3.2* 3.2* 3.1* 3.0*   Recent Labs  Lab 09/02/19 0451  LIPASE 27   No results for input(s): AMMONIA in the last 168 hours. CBC: Recent Labs  Lab 09/01/19 1135 09/02/19 0451 09/03/19 0520 09/04/19 0435 09/05/19 0500  WBC 12.2* 11.4* 9.1 8.8 9.4  NEUTROABS  --  6.5 5.3 5.0 5.8  HGB 12.4 13.3 12.1 12.2 11.7*  HCT 38.3 41.9 37.4 37.7 36.7  MCV 90.5 90.9 89.5 90.4 90.4  PLT 250 268 259 235 232   Cardiac Enzymes: No results for input(s): CKTOTAL, CKMB, CKMBINDEX, TROPONINI in the last 168 hours. BNP: BNP (last 3 results) Recent Labs    09/03/19 0500  BNP 30.7    ProBNP (last 3 results) No results for input(s): PROBNP in the last 8760 hours.  CBG: No results for input(s): GLUCAP in the last 168 hours.     Signed:  Fayrene Helper MD.  [facility] 09/06/2019, 2:13 PM

## 2019-09-06 NOTE — TOC Transition Note (Signed)
Transition of Care [facility]) - CM/SW Discharge Note   Patient Details  Name: Natasha [name] MRN: [id number] Date of Birth: 07/13/1951  Transition of Care [facility]) CM/SW Contact:  Weston Anna, LCSW Phone Number: 09/06/2019, 2:39 PM   Clinical Narrative:     Patient set to go to [facility] called for transportation. Patient notified of discharge via phone. No other concerns at this time.     Barriers to Discharge: Continued Medical Work up   Patient Goals and CMS Choice Patient states their goals for this hospitalization and ongoing recovery are:: to go to SNF and return home. CMS Medicare.gov Compare Post Acute Care list provided to:: Patient Choice offered to / list presented to : Patient  Discharge Placement                       Discharge Plan and Services                                     Social Determinants of Health (SDOH) Interventions     Readmission Risk Interventions No flowsheet data found.

## 2019-09-06 NOTE — Progress Notes (Addendum)
EMS here to pick patient up. Have attempted to call report to the [facility] 3 times with no success, case management notified.  Explained discharge instructions to patient, all questions answered.   Belongings that EMS could not bring (jacket, book, candy, some clothes) remain and will be at front desk until family can come pick them up.

## 2019-09-08 DIAGNOSIS — I10 Essential (primary) hypertension: Secondary | ICD-10-CM | POA: Diagnosis not present

## 2019-09-08 DIAGNOSIS — I2699 Other pulmonary embolism without acute cor pulmonale: Secondary | ICD-10-CM | POA: Diagnosis not present

## 2019-09-08 DIAGNOSIS — G4733 Obstructive sleep apnea (adult) (pediatric): Secondary | ICD-10-CM | POA: Diagnosis not present

## 2019-09-08 DIAGNOSIS — U071 COVID-19: Secondary | ICD-10-CM | POA: Diagnosis not present

## 2019-09-09 DIAGNOSIS — I10 Essential (primary) hypertension: Secondary | ICD-10-CM | POA: Diagnosis not present

## 2019-09-09 DIAGNOSIS — U071 COVID-19: Secondary | ICD-10-CM | POA: Diagnosis not present

## 2019-09-09 DIAGNOSIS — I2699 Other pulmonary embolism without acute cor pulmonale: Secondary | ICD-10-CM | POA: Diagnosis not present

## 2019-09-09 DIAGNOSIS — G4733 Obstructive sleep apnea (adult) (pediatric): Secondary | ICD-10-CM | POA: Diagnosis not present

## 2019-09-15 DIAGNOSIS — G5621 Lesion of ulnar nerve, right upper limb: Secondary | ICD-10-CM | POA: Diagnosis not present

## 2019-09-15 DIAGNOSIS — J1282 Pneumonia due to coronavirus disease 2019: Secondary | ICD-10-CM | POA: Diagnosis not present

## 2019-09-15 DIAGNOSIS — U071 COVID-19: Secondary | ICD-10-CM | POA: Diagnosis not present

## 2019-09-15 DIAGNOSIS — I2699 Other pulmonary embolism without acute cor pulmonale: Secondary | ICD-10-CM | POA: Diagnosis not present

## 2019-09-15 DIAGNOSIS — J9601 Acute respiratory failure with hypoxia: Secondary | ICD-10-CM | POA: Diagnosis not present

## 2019-09-15 DIAGNOSIS — G4733 Obstructive sleep apnea (adult) (pediatric): Secondary | ICD-10-CM | POA: Diagnosis not present

## 2019-09-15 DIAGNOSIS — I10 Essential (primary) hypertension: Secondary | ICD-10-CM | POA: Diagnosis not present

## 2019-09-15 DIAGNOSIS — E890 Postprocedural hypothyroidism: Secondary | ICD-10-CM | POA: Diagnosis not present

## 2019-09-17 DIAGNOSIS — U071 COVID-19: Secondary | ICD-10-CM | POA: Diagnosis not present

## 2019-09-17 DIAGNOSIS — J9601 Acute respiratory failure with hypoxia: Secondary | ICD-10-CM | POA: Diagnosis not present

## 2019-09-17 DIAGNOSIS — G5621 Lesion of ulnar nerve, right upper limb: Secondary | ICD-10-CM | POA: Diagnosis not present

## 2019-09-17 DIAGNOSIS — I2699 Other pulmonary embolism without acute cor pulmonale: Secondary | ICD-10-CM | POA: Diagnosis not present

## 2019-09-17 DIAGNOSIS — J1282 Pneumonia due to coronavirus disease 2019: Secondary | ICD-10-CM | POA: Diagnosis not present

## 2019-09-17 DIAGNOSIS — G4733 Obstructive sleep apnea (adult) (pediatric): Secondary | ICD-10-CM | POA: Diagnosis not present

## 2019-09-17 DIAGNOSIS — E890 Postprocedural hypothyroidism: Secondary | ICD-10-CM | POA: Diagnosis not present

## 2019-09-17 DIAGNOSIS — I10 Essential (primary) hypertension: Secondary | ICD-10-CM | POA: Diagnosis not present

## 2019-09-20 DIAGNOSIS — I2699 Other pulmonary embolism without acute cor pulmonale: Secondary | ICD-10-CM | POA: Diagnosis not present

## 2019-09-20 DIAGNOSIS — E890 Postprocedural hypothyroidism: Secondary | ICD-10-CM | POA: Diagnosis not present

## 2019-09-20 DIAGNOSIS — G5621 Lesion of ulnar nerve, right upper limb: Secondary | ICD-10-CM | POA: Diagnosis not present

## 2019-09-20 DIAGNOSIS — J1282 Pneumonia due to coronavirus disease 2019: Secondary | ICD-10-CM | POA: Diagnosis not present

## 2019-09-20 DIAGNOSIS — J9601 Acute respiratory failure with hypoxia: Secondary | ICD-10-CM | POA: Diagnosis not present

## 2019-09-20 DIAGNOSIS — G4733 Obstructive sleep apnea (adult) (pediatric): Secondary | ICD-10-CM | POA: Diagnosis not present

## 2019-09-20 DIAGNOSIS — U071 COVID-19: Secondary | ICD-10-CM | POA: Diagnosis not present

## 2019-09-20 DIAGNOSIS — I10 Essential (primary) hypertension: Secondary | ICD-10-CM | POA: Diagnosis not present

## 2019-09-22 DIAGNOSIS — U071 COVID-19: Secondary | ICD-10-CM | POA: Diagnosis not present

## 2019-09-22 DIAGNOSIS — N3942 Incontinence without sensory awareness: Secondary | ICD-10-CM | POA: Diagnosis not present

## 2019-09-22 DIAGNOSIS — I10 Essential (primary) hypertension: Secondary | ICD-10-CM | POA: Diagnosis not present

## 2019-09-22 DIAGNOSIS — E890 Postprocedural hypothyroidism: Secondary | ICD-10-CM | POA: Diagnosis not present

## 2019-09-22 DIAGNOSIS — J9601 Acute respiratory failure with hypoxia: Secondary | ICD-10-CM | POA: Diagnosis not present

## 2019-09-22 DIAGNOSIS — I2699 Other pulmonary embolism without acute cor pulmonale: Secondary | ICD-10-CM | POA: Diagnosis not present

## 2019-09-22 DIAGNOSIS — G4733 Obstructive sleep apnea (adult) (pediatric): Secondary | ICD-10-CM | POA: Diagnosis not present

## 2019-09-22 DIAGNOSIS — J399 Disease of upper respiratory tract, unspecified: Secondary | ICD-10-CM | POA: Diagnosis not present

## 2019-09-22 DIAGNOSIS — J1282 Pneumonia due to coronavirus disease 2019: Secondary | ICD-10-CM | POA: Diagnosis not present

## 2019-09-22 DIAGNOSIS — G5621 Lesion of ulnar nerve, right upper limb: Secondary | ICD-10-CM | POA: Diagnosis not present

## 2019-09-23 DIAGNOSIS — E890 Postprocedural hypothyroidism: Secondary | ICD-10-CM | POA: Diagnosis not present

## 2019-09-23 DIAGNOSIS — J1282 Pneumonia due to coronavirus disease 2019: Secondary | ICD-10-CM | POA: Diagnosis not present

## 2019-09-23 DIAGNOSIS — I10 Essential (primary) hypertension: Secondary | ICD-10-CM | POA: Diagnosis not present

## 2019-09-23 DIAGNOSIS — U071 COVID-19: Secondary | ICD-10-CM | POA: Diagnosis not present

## 2019-09-23 DIAGNOSIS — J9601 Acute respiratory failure with hypoxia: Secondary | ICD-10-CM | POA: Diagnosis not present

## 2019-09-23 DIAGNOSIS — G4733 Obstructive sleep apnea (adult) (pediatric): Secondary | ICD-10-CM | POA: Diagnosis not present

## 2019-09-23 DIAGNOSIS — G5621 Lesion of ulnar nerve, right upper limb: Secondary | ICD-10-CM | POA: Diagnosis not present

## 2019-09-23 DIAGNOSIS — I2699 Other pulmonary embolism without acute cor pulmonale: Secondary | ICD-10-CM | POA: Diagnosis not present

## 2019-09-27 DIAGNOSIS — G5621 Lesion of ulnar nerve, right upper limb: Secondary | ICD-10-CM | POA: Diagnosis not present

## 2019-09-27 DIAGNOSIS — J9601 Acute respiratory failure with hypoxia: Secondary | ICD-10-CM | POA: Diagnosis not present

## 2019-09-27 DIAGNOSIS — J1282 Pneumonia due to coronavirus disease 2019: Secondary | ICD-10-CM | POA: Diagnosis not present

## 2019-09-27 DIAGNOSIS — G4733 Obstructive sleep apnea (adult) (pediatric): Secondary | ICD-10-CM | POA: Diagnosis not present

## 2019-09-27 DIAGNOSIS — U071 COVID-19: Secondary | ICD-10-CM | POA: Diagnosis not present

## 2019-09-27 DIAGNOSIS — I2699 Other pulmonary embolism without acute cor pulmonale: Secondary | ICD-10-CM | POA: Diagnosis not present

## 2019-09-27 DIAGNOSIS — E890 Postprocedural hypothyroidism: Secondary | ICD-10-CM | POA: Diagnosis not present

## 2019-09-27 DIAGNOSIS — I10 Essential (primary) hypertension: Secondary | ICD-10-CM | POA: Diagnosis not present

## 2019-09-28 DIAGNOSIS — J1282 Pneumonia due to coronavirus disease 2019: Secondary | ICD-10-CM | POA: Diagnosis not present

## 2019-09-28 DIAGNOSIS — I10 Essential (primary) hypertension: Secondary | ICD-10-CM | POA: Diagnosis not present

## 2019-09-28 DIAGNOSIS — N3942 Incontinence without sensory awareness: Secondary | ICD-10-CM | POA: Diagnosis not present

## 2019-09-28 DIAGNOSIS — U071 COVID-19: Secondary | ICD-10-CM | POA: Diagnosis not present

## 2019-09-29 DIAGNOSIS — G4733 Obstructive sleep apnea (adult) (pediatric): Secondary | ICD-10-CM | POA: Diagnosis not present

## 2019-09-29 DIAGNOSIS — J9601 Acute respiratory failure with hypoxia: Secondary | ICD-10-CM | POA: Diagnosis not present

## 2019-09-29 DIAGNOSIS — G5621 Lesion of ulnar nerve, right upper limb: Secondary | ICD-10-CM | POA: Diagnosis not present

## 2019-09-29 DIAGNOSIS — I10 Essential (primary) hypertension: Secondary | ICD-10-CM | POA: Diagnosis not present

## 2019-09-29 DIAGNOSIS — E890 Postprocedural hypothyroidism: Secondary | ICD-10-CM | POA: Diagnosis not present

## 2019-09-29 DIAGNOSIS — U071 COVID-19: Secondary | ICD-10-CM | POA: Diagnosis not present

## 2019-09-29 DIAGNOSIS — I2699 Other pulmonary embolism without acute cor pulmonale: Secondary | ICD-10-CM | POA: Diagnosis not present

## 2019-09-29 DIAGNOSIS — J1282 Pneumonia due to coronavirus disease 2019: Secondary | ICD-10-CM | POA: Diagnosis not present

## 2019-09-30 DIAGNOSIS — J1282 Pneumonia due to coronavirus disease 2019: Secondary | ICD-10-CM | POA: Diagnosis not present

## 2019-09-30 DIAGNOSIS — G5621 Lesion of ulnar nerve, right upper limb: Secondary | ICD-10-CM | POA: Diagnosis not present

## 2019-09-30 DIAGNOSIS — J9601 Acute respiratory failure with hypoxia: Secondary | ICD-10-CM | POA: Diagnosis not present

## 2019-09-30 DIAGNOSIS — G4733 Obstructive sleep apnea (adult) (pediatric): Secondary | ICD-10-CM | POA: Diagnosis not present

## 2019-09-30 DIAGNOSIS — I10 Essential (primary) hypertension: Secondary | ICD-10-CM | POA: Diagnosis not present

## 2019-09-30 DIAGNOSIS — I2699 Other pulmonary embolism without acute cor pulmonale: Secondary | ICD-10-CM | POA: Diagnosis not present

## 2019-09-30 DIAGNOSIS — E890 Postprocedural hypothyroidism: Secondary | ICD-10-CM | POA: Diagnosis not present

## 2019-09-30 DIAGNOSIS — U071 COVID-19: Secondary | ICD-10-CM | POA: Diagnosis not present

## 2019-10-04 DIAGNOSIS — G4733 Obstructive sleep apnea (adult) (pediatric): Secondary | ICD-10-CM | POA: Diagnosis not present

## 2019-10-04 DIAGNOSIS — E890 Postprocedural hypothyroidism: Secondary | ICD-10-CM | POA: Diagnosis not present

## 2019-10-04 DIAGNOSIS — I10 Essential (primary) hypertension: Secondary | ICD-10-CM | POA: Diagnosis not present

## 2019-10-04 DIAGNOSIS — U071 COVID-19: Secondary | ICD-10-CM | POA: Diagnosis not present

## 2019-10-04 DIAGNOSIS — J1282 Pneumonia due to coronavirus disease 2019: Secondary | ICD-10-CM | POA: Diagnosis not present

## 2019-10-04 DIAGNOSIS — G5621 Lesion of ulnar nerve, right upper limb: Secondary | ICD-10-CM | POA: Diagnosis not present

## 2019-10-04 DIAGNOSIS — I2699 Other pulmonary embolism without acute cor pulmonale: Secondary | ICD-10-CM | POA: Diagnosis not present

## 2019-10-04 DIAGNOSIS — J9601 Acute respiratory failure with hypoxia: Secondary | ICD-10-CM | POA: Diagnosis not present

## 2019-10-06 DIAGNOSIS — J1282 Pneumonia due to coronavirus disease 2019: Secondary | ICD-10-CM | POA: Diagnosis not present

## 2019-10-06 DIAGNOSIS — G5621 Lesion of ulnar nerve, right upper limb: Secondary | ICD-10-CM | POA: Diagnosis not present

## 2019-10-06 DIAGNOSIS — I2699 Other pulmonary embolism without acute cor pulmonale: Secondary | ICD-10-CM | POA: Diagnosis not present

## 2019-10-06 DIAGNOSIS — J9601 Acute respiratory failure with hypoxia: Secondary | ICD-10-CM | POA: Diagnosis not present

## 2019-10-06 DIAGNOSIS — E890 Postprocedural hypothyroidism: Secondary | ICD-10-CM | POA: Diagnosis not present

## 2019-10-06 DIAGNOSIS — G4733 Obstructive sleep apnea (adult) (pediatric): Secondary | ICD-10-CM | POA: Diagnosis not present

## 2019-10-06 DIAGNOSIS — U071 COVID-19: Secondary | ICD-10-CM | POA: Diagnosis not present

## 2019-10-06 DIAGNOSIS — I10 Essential (primary) hypertension: Secondary | ICD-10-CM | POA: Diagnosis not present

## 2019-10-07 DIAGNOSIS — J1282 Pneumonia due to coronavirus disease 2019: Secondary | ICD-10-CM | POA: Diagnosis not present

## 2019-10-07 DIAGNOSIS — I2699 Other pulmonary embolism without acute cor pulmonale: Secondary | ICD-10-CM | POA: Diagnosis not present

## 2019-10-07 DIAGNOSIS — I10 Essential (primary) hypertension: Secondary | ICD-10-CM | POA: Diagnosis not present

## 2019-10-07 DIAGNOSIS — G5621 Lesion of ulnar nerve, right upper limb: Secondary | ICD-10-CM | POA: Diagnosis not present

## 2019-10-07 DIAGNOSIS — J9601 Acute respiratory failure with hypoxia: Secondary | ICD-10-CM | POA: Diagnosis not present

## 2019-10-07 DIAGNOSIS — U071 COVID-19: Secondary | ICD-10-CM | POA: Diagnosis not present

## 2019-10-07 DIAGNOSIS — G4733 Obstructive sleep apnea (adult) (pediatric): Secondary | ICD-10-CM | POA: Diagnosis not present

## 2019-10-07 DIAGNOSIS — E890 Postprocedural hypothyroidism: Secondary | ICD-10-CM | POA: Diagnosis not present

## 2019-10-11 DIAGNOSIS — G5621 Lesion of ulnar nerve, right upper limb: Secondary | ICD-10-CM | POA: Diagnosis not present

## 2019-10-11 DIAGNOSIS — J9601 Acute respiratory failure with hypoxia: Secondary | ICD-10-CM | POA: Diagnosis not present

## 2019-10-11 DIAGNOSIS — E890 Postprocedural hypothyroidism: Secondary | ICD-10-CM | POA: Diagnosis not present

## 2019-10-11 DIAGNOSIS — U071 COVID-19: Secondary | ICD-10-CM | POA: Diagnosis not present

## 2019-10-11 DIAGNOSIS — I2699 Other pulmonary embolism without acute cor pulmonale: Secondary | ICD-10-CM | POA: Diagnosis not present

## 2019-10-11 DIAGNOSIS — G4733 Obstructive sleep apnea (adult) (pediatric): Secondary | ICD-10-CM | POA: Diagnosis not present

## 2019-10-11 DIAGNOSIS — J1282 Pneumonia due to coronavirus disease 2019: Secondary | ICD-10-CM | POA: Diagnosis not present

## 2019-10-11 DIAGNOSIS — I10 Essential (primary) hypertension: Secondary | ICD-10-CM | POA: Diagnosis not present

## 2019-10-12 DIAGNOSIS — J9601 Acute respiratory failure with hypoxia: Secondary | ICD-10-CM | POA: Diagnosis not present

## 2019-10-12 DIAGNOSIS — G5621 Lesion of ulnar nerve, right upper limb: Secondary | ICD-10-CM | POA: Diagnosis not present

## 2019-10-12 DIAGNOSIS — I2699 Other pulmonary embolism without acute cor pulmonale: Secondary | ICD-10-CM | POA: Diagnosis not present

## 2019-10-12 DIAGNOSIS — I10 Essential (primary) hypertension: Secondary | ICD-10-CM | POA: Diagnosis not present

## 2019-10-12 DIAGNOSIS — J1282 Pneumonia due to coronavirus disease 2019: Secondary | ICD-10-CM | POA: Diagnosis not present

## 2019-10-12 DIAGNOSIS — U071 COVID-19: Secondary | ICD-10-CM | POA: Diagnosis not present

## 2019-10-12 DIAGNOSIS — G4733 Obstructive sleep apnea (adult) (pediatric): Secondary | ICD-10-CM | POA: Diagnosis not present

## 2019-10-12 DIAGNOSIS — E890 Postprocedural hypothyroidism: Secondary | ICD-10-CM | POA: Diagnosis not present

## 2019-10-13 DIAGNOSIS — U071 COVID-19: Secondary | ICD-10-CM | POA: Diagnosis not present

## 2019-10-13 DIAGNOSIS — J1282 Pneumonia due to coronavirus disease 2019: Secondary | ICD-10-CM | POA: Diagnosis not present

## 2019-10-13 DIAGNOSIS — I2699 Other pulmonary embolism without acute cor pulmonale: Secondary | ICD-10-CM | POA: Diagnosis not present

## 2019-10-13 DIAGNOSIS — G5621 Lesion of ulnar nerve, right upper limb: Secondary | ICD-10-CM | POA: Diagnosis not present

## 2019-10-13 DIAGNOSIS — G4733 Obstructive sleep apnea (adult) (pediatric): Secondary | ICD-10-CM | POA: Diagnosis not present

## 2019-10-13 DIAGNOSIS — J9601 Acute respiratory failure with hypoxia: Secondary | ICD-10-CM | POA: Diagnosis not present

## 2019-10-13 DIAGNOSIS — I10 Essential (primary) hypertension: Secondary | ICD-10-CM | POA: Diagnosis not present

## 2019-10-13 DIAGNOSIS — E890 Postprocedural hypothyroidism: Secondary | ICD-10-CM | POA: Diagnosis not present

## 2019-10-15 DIAGNOSIS — G4733 Obstructive sleep apnea (adult) (pediatric): Secondary | ICD-10-CM | POA: Diagnosis not present

## 2019-10-15 DIAGNOSIS — U071 COVID-19: Secondary | ICD-10-CM | POA: Diagnosis not present

## 2019-10-15 DIAGNOSIS — I2699 Other pulmonary embolism without acute cor pulmonale: Secondary | ICD-10-CM | POA: Diagnosis not present

## 2019-10-15 DIAGNOSIS — I10 Essential (primary) hypertension: Secondary | ICD-10-CM | POA: Diagnosis not present

## 2019-10-15 DIAGNOSIS — E890 Postprocedural hypothyroidism: Secondary | ICD-10-CM | POA: Diagnosis not present

## 2019-10-15 DIAGNOSIS — J1282 Pneumonia due to coronavirus disease 2019: Secondary | ICD-10-CM | POA: Diagnosis not present

## 2019-10-15 DIAGNOSIS — J9601 Acute respiratory failure with hypoxia: Secondary | ICD-10-CM | POA: Diagnosis not present

## 2019-10-15 DIAGNOSIS — G5621 Lesion of ulnar nerve, right upper limb: Secondary | ICD-10-CM | POA: Diagnosis not present

## 2019-10-18 DIAGNOSIS — U071 COVID-19: Secondary | ICD-10-CM | POA: Diagnosis not present

## 2019-10-18 DIAGNOSIS — G5621 Lesion of ulnar nerve, right upper limb: Secondary | ICD-10-CM | POA: Diagnosis not present

## 2019-10-18 DIAGNOSIS — G4733 Obstructive sleep apnea (adult) (pediatric): Secondary | ICD-10-CM | POA: Diagnosis not present

## 2019-10-18 DIAGNOSIS — E890 Postprocedural hypothyroidism: Secondary | ICD-10-CM | POA: Diagnosis not present

## 2019-10-18 DIAGNOSIS — I2699 Other pulmonary embolism without acute cor pulmonale: Secondary | ICD-10-CM | POA: Diagnosis not present

## 2019-10-18 DIAGNOSIS — J1282 Pneumonia due to coronavirus disease 2019: Secondary | ICD-10-CM | POA: Diagnosis not present

## 2019-10-18 DIAGNOSIS — J9601 Acute respiratory failure with hypoxia: Secondary | ICD-10-CM | POA: Diagnosis not present

## 2019-10-18 DIAGNOSIS — I10 Essential (primary) hypertension: Secondary | ICD-10-CM | POA: Diagnosis not present

## 2019-10-19 DIAGNOSIS — G4733 Obstructive sleep apnea (adult) (pediatric): Secondary | ICD-10-CM | POA: Diagnosis not present

## 2019-10-19 DIAGNOSIS — J1282 Pneumonia due to coronavirus disease 2019: Secondary | ICD-10-CM | POA: Diagnosis not present

## 2019-10-19 DIAGNOSIS — I10 Essential (primary) hypertension: Secondary | ICD-10-CM | POA: Diagnosis not present

## 2019-10-19 DIAGNOSIS — U071 COVID-19: Secondary | ICD-10-CM | POA: Diagnosis not present

## 2019-10-19 DIAGNOSIS — E890 Postprocedural hypothyroidism: Secondary | ICD-10-CM | POA: Diagnosis not present

## 2019-10-19 DIAGNOSIS — J9601 Acute respiratory failure with hypoxia: Secondary | ICD-10-CM | POA: Diagnosis not present

## 2019-10-19 DIAGNOSIS — I2699 Other pulmonary embolism without acute cor pulmonale: Secondary | ICD-10-CM | POA: Diagnosis not present

## 2019-10-19 DIAGNOSIS — G5621 Lesion of ulnar nerve, right upper limb: Secondary | ICD-10-CM | POA: Diagnosis not present

## 2019-10-20 DIAGNOSIS — E890 Postprocedural hypothyroidism: Secondary | ICD-10-CM | POA: Diagnosis not present

## 2019-10-20 DIAGNOSIS — I2699 Other pulmonary embolism without acute cor pulmonale: Secondary | ICD-10-CM | POA: Diagnosis not present

## 2019-10-20 DIAGNOSIS — U071 COVID-19: Secondary | ICD-10-CM | POA: Diagnosis not present

## 2019-10-20 DIAGNOSIS — J1282 Pneumonia due to coronavirus disease 2019: Secondary | ICD-10-CM | POA: Diagnosis not present

## 2019-10-20 DIAGNOSIS — I10 Essential (primary) hypertension: Secondary | ICD-10-CM | POA: Diagnosis not present

## 2019-10-20 DIAGNOSIS — G5621 Lesion of ulnar nerve, right upper limb: Secondary | ICD-10-CM | POA: Diagnosis not present

## 2019-10-20 DIAGNOSIS — G4733 Obstructive sleep apnea (adult) (pediatric): Secondary | ICD-10-CM | POA: Diagnosis not present

## 2019-10-20 DIAGNOSIS — J9601 Acute respiratory failure with hypoxia: Secondary | ICD-10-CM | POA: Diagnosis not present

## 2019-10-25 DIAGNOSIS — J1282 Pneumonia due to coronavirus disease 2019: Secondary | ICD-10-CM | POA: Diagnosis not present

## 2019-10-25 DIAGNOSIS — E890 Postprocedural hypothyroidism: Secondary | ICD-10-CM | POA: Diagnosis not present

## 2019-10-25 DIAGNOSIS — G4733 Obstructive sleep apnea (adult) (pediatric): Secondary | ICD-10-CM | POA: Diagnosis not present

## 2019-10-25 DIAGNOSIS — I2699 Other pulmonary embolism without acute cor pulmonale: Secondary | ICD-10-CM | POA: Diagnosis not present

## 2019-10-25 DIAGNOSIS — J9601 Acute respiratory failure with hypoxia: Secondary | ICD-10-CM | POA: Diagnosis not present

## 2019-10-25 DIAGNOSIS — G5621 Lesion of ulnar nerve, right upper limb: Secondary | ICD-10-CM | POA: Diagnosis not present

## 2019-10-25 DIAGNOSIS — I10 Essential (primary) hypertension: Secondary | ICD-10-CM | POA: Diagnosis not present

## 2019-10-25 DIAGNOSIS — U071 COVID-19: Secondary | ICD-10-CM | POA: Diagnosis not present

## 2019-10-26 DIAGNOSIS — U071 COVID-19: Secondary | ICD-10-CM | POA: Diagnosis not present

## 2019-10-26 DIAGNOSIS — J1282 Pneumonia due to coronavirus disease 2019: Secondary | ICD-10-CM | POA: Diagnosis not present

## 2019-10-26 DIAGNOSIS — I10 Essential (primary) hypertension: Secondary | ICD-10-CM | POA: Diagnosis not present

## 2019-10-26 DIAGNOSIS — J9601 Acute respiratory failure with hypoxia: Secondary | ICD-10-CM | POA: Diagnosis not present

## 2019-10-26 DIAGNOSIS — I2699 Other pulmonary embolism without acute cor pulmonale: Secondary | ICD-10-CM | POA: Diagnosis not present

## 2019-10-26 DIAGNOSIS — E890 Postprocedural hypothyroidism: Secondary | ICD-10-CM | POA: Diagnosis not present

## 2019-10-26 DIAGNOSIS — G5621 Lesion of ulnar nerve, right upper limb: Secondary | ICD-10-CM | POA: Diagnosis not present

## 2019-10-26 DIAGNOSIS — G4733 Obstructive sleep apnea (adult) (pediatric): Secondary | ICD-10-CM | POA: Diagnosis not present

## 2019-10-27 DIAGNOSIS — J1282 Pneumonia due to coronavirus disease 2019: Secondary | ICD-10-CM | POA: Diagnosis not present

## 2019-10-27 DIAGNOSIS — E890 Postprocedural hypothyroidism: Secondary | ICD-10-CM | POA: Diagnosis not present

## 2019-10-27 DIAGNOSIS — G5621 Lesion of ulnar nerve, right upper limb: Secondary | ICD-10-CM | POA: Diagnosis not present

## 2019-10-27 DIAGNOSIS — I10 Essential (primary) hypertension: Secondary | ICD-10-CM | POA: Diagnosis not present

## 2019-10-27 DIAGNOSIS — I2699 Other pulmonary embolism without acute cor pulmonale: Secondary | ICD-10-CM | POA: Diagnosis not present

## 2019-10-27 DIAGNOSIS — U071 COVID-19: Secondary | ICD-10-CM | POA: Diagnosis not present

## 2019-10-27 DIAGNOSIS — G4733 Obstructive sleep apnea (adult) (pediatric): Secondary | ICD-10-CM | POA: Diagnosis not present

## 2019-10-27 DIAGNOSIS — J9601 Acute respiratory failure with hypoxia: Secondary | ICD-10-CM | POA: Diagnosis not present

## 2019-11-01 DIAGNOSIS — U071 COVID-19: Secondary | ICD-10-CM | POA: Diagnosis not present

## 2019-11-01 DIAGNOSIS — I10 Essential (primary) hypertension: Secondary | ICD-10-CM | POA: Diagnosis not present

## 2019-11-01 DIAGNOSIS — J9601 Acute respiratory failure with hypoxia: Secondary | ICD-10-CM | POA: Diagnosis not present

## 2019-11-01 DIAGNOSIS — E890 Postprocedural hypothyroidism: Secondary | ICD-10-CM | POA: Diagnosis not present

## 2019-11-01 DIAGNOSIS — G4733 Obstructive sleep apnea (adult) (pediatric): Secondary | ICD-10-CM | POA: Diagnosis not present

## 2019-11-01 DIAGNOSIS — G5621 Lesion of ulnar nerve, right upper limb: Secondary | ICD-10-CM | POA: Diagnosis not present

## 2019-11-01 DIAGNOSIS — J1282 Pneumonia due to coronavirus disease 2019: Secondary | ICD-10-CM | POA: Diagnosis not present

## 2019-11-01 DIAGNOSIS — I2699 Other pulmonary embolism without acute cor pulmonale: Secondary | ICD-10-CM | POA: Diagnosis not present

## 2019-11-02 DIAGNOSIS — I10 Essential (primary) hypertension: Secondary | ICD-10-CM | POA: Diagnosis not present

## 2019-11-02 DIAGNOSIS — E039 Hypothyroidism, unspecified: Secondary | ICD-10-CM | POA: Diagnosis not present

## 2019-11-02 DIAGNOSIS — E785 Hyperlipidemia, unspecified: Secondary | ICD-10-CM | POA: Diagnosis not present

## 2019-11-03 DIAGNOSIS — M3589 Other specified systemic involvement of connective tissue: Secondary | ICD-10-CM | POA: Diagnosis not present

## 2019-11-03 DIAGNOSIS — M3581 Multisystem inflammatory syndrome: Secondary | ICD-10-CM | POA: Diagnosis not present

## 2019-11-03 DIAGNOSIS — Z8616 Personal history of COVID-19: Secondary | ICD-10-CM | POA: Diagnosis not present

## 2019-11-03 DIAGNOSIS — R601 Generalized edema: Secondary | ICD-10-CM | POA: Diagnosis not present

## 2019-11-03 DIAGNOSIS — E7800 Pure hypercholesterolemia, unspecified: Secondary | ICD-10-CM | POA: Diagnosis not present

## 2019-11-03 DIAGNOSIS — E038 Other specified hypothyroidism: Secondary | ICD-10-CM | POA: Diagnosis not present

## 2019-11-03 DIAGNOSIS — E785 Hyperlipidemia, unspecified: Secondary | ICD-10-CM | POA: Diagnosis not present

## 2019-11-03 DIAGNOSIS — J1282 Pneumonia due to coronavirus disease 2019: Secondary | ICD-10-CM | POA: Diagnosis not present

## 2019-11-04 DIAGNOSIS — J1282 Pneumonia due to coronavirus disease 2019: Secondary | ICD-10-CM | POA: Diagnosis not present

## 2019-11-04 DIAGNOSIS — G4733 Obstructive sleep apnea (adult) (pediatric): Secondary | ICD-10-CM | POA: Diagnosis not present

## 2019-11-04 DIAGNOSIS — G5621 Lesion of ulnar nerve, right upper limb: Secondary | ICD-10-CM | POA: Diagnosis not present

## 2019-11-04 DIAGNOSIS — U071 COVID-19: Secondary | ICD-10-CM | POA: Diagnosis not present

## 2019-11-04 DIAGNOSIS — J9601 Acute respiratory failure with hypoxia: Secondary | ICD-10-CM | POA: Diagnosis not present

## 2019-11-04 DIAGNOSIS — E890 Postprocedural hypothyroidism: Secondary | ICD-10-CM | POA: Diagnosis not present

## 2019-11-04 DIAGNOSIS — I2699 Other pulmonary embolism without acute cor pulmonale: Secondary | ICD-10-CM | POA: Diagnosis not present

## 2019-11-04 DIAGNOSIS — I10 Essential (primary) hypertension: Secondary | ICD-10-CM | POA: Diagnosis not present

## 2019-11-08 ENCOUNTER — Ambulatory Visit (INDEPENDENT_AMBULATORY_CARE_PROVIDER_SITE_OTHER): Payer: Medicare HMO | Admitting: Nurse Practitioner

## 2019-11-08 ENCOUNTER — Other Ambulatory Visit: Payer: Self-pay

## 2019-11-08 ENCOUNTER — Ambulatory Visit (HOSPITAL_COMMUNITY)
Admission: RE | Admit: 2019-11-08 | Discharge: 2019-11-08 | Disposition: A | Discharge status: Home / Self Care | Payer: Medicare HMO | Source: Ambulatory Visit | Attending: Nurse Practitioner | Admitting: Nurse Practitioner

## 2019-11-08 VITALS — BP 118/78 | HR 65 | Temp 96.0°F | Ht 67.0 in | Wt 319.0 lb

## 2019-11-08 DIAGNOSIS — R05 Cough: Secondary | ICD-10-CM | POA: Diagnosis not present

## 2019-11-08 DIAGNOSIS — E890 Postprocedural hypothyroidism: Secondary | ICD-10-CM | POA: Diagnosis not present

## 2019-11-08 DIAGNOSIS — R202 Paresthesia of skin: Secondary | ICD-10-CM | POA: Diagnosis not present

## 2019-11-08 DIAGNOSIS — Z8616 Personal history of COVID-19: Secondary | ICD-10-CM | POA: Diagnosis not present

## 2019-11-08 DIAGNOSIS — R059 Cough, unspecified: Secondary | ICD-10-CM

## 2019-11-08 DIAGNOSIS — R200 Anesthesia of skin: Secondary | ICD-10-CM

## 2019-11-08 DIAGNOSIS — U071 COVID-19: Secondary | ICD-10-CM | POA: Insufficient documentation

## 2019-11-08 DIAGNOSIS — I2699 Other pulmonary embolism without acute cor pulmonale: Secondary | ICD-10-CM | POA: Diagnosis not present

## 2019-11-08 DIAGNOSIS — J9601 Acute respiratory failure with hypoxia: Secondary | ICD-10-CM | POA: Diagnosis not present

## 2019-11-08 DIAGNOSIS — G5621 Lesion of ulnar nerve, right upper limb: Secondary | ICD-10-CM | POA: Diagnosis not present

## 2019-11-08 DIAGNOSIS — R911 Solitary pulmonary nodule: Secondary | ICD-10-CM

## 2019-11-08 DIAGNOSIS — R413 Other amnesia: Secondary | ICD-10-CM

## 2019-11-08 DIAGNOSIS — M6281 Muscle weakness (generalized): Secondary | ICD-10-CM

## 2019-11-08 DIAGNOSIS — I10 Essential (primary) hypertension: Secondary | ICD-10-CM | POA: Diagnosis not present

## 2019-11-08 DIAGNOSIS — R0602 Shortness of breath: Secondary | ICD-10-CM | POA: Diagnosis not present

## 2019-11-08 DIAGNOSIS — J1282 Pneumonia due to coronavirus disease 2019: Secondary | ICD-10-CM | POA: Insufficient documentation

## 2019-11-08 DIAGNOSIS — R609 Edema, unspecified: Secondary | ICD-10-CM | POA: Diagnosis not present

## 2019-11-08 DIAGNOSIS — G4733 Obstructive sleep apnea (adult) (pediatric): Secondary | ICD-10-CM | POA: Diagnosis not present

## 2019-11-08 NOTE — Patient Instructions (Addendum)
Pulmonary embolism: Continue Xarelto Referral placed to pulmonary Referral placed to Cardiology - follow up echo needed  Covid pneumonia: Referral placed to pulmonary Will order chest x ray  Pulmonary nodule: Referral placed to pulmonary   Edema: followed by PCP Low sodium diet Wear compression hose Keep legs elevated Continue increased dose of lasix daily  Memory loss and numbness to upper and lower extremities: Referral placed to Neurology  Keep follow up appointment with PCP  Follow up: 2 months or sooner if needed   Edema  Edema is when you have too much fluid in your body or under your skin. Edema may make your legs, feet, and ankles swell up. Swelling is also common in looser tissues, like around your eyes. This is a common condition. It gets more common as you get older. There are many possible causes of edema. Eating too much salt (sodium) and being on your feet or sitting for a long time can cause edema in your legs, feet, and ankles. Hot weather may make edema worse. Edema is usually painless. Your skin may look swollen or shiny. Follow these instructions at home:  Keep the swollen body part raised (elevated) above the level of your heart when you are sitting or lying down.  Do not sit still or stand for a long time.  Do not wear tight clothes. Do not wear garters on your upper legs.  Exercise your legs. This can help the swelling go down.  Wear elastic bandages or support stockings as told by your doctor.  Eat a low-salt (low-sodium) diet to reduce fluid as told by your doctor.  Depending on the cause of your swelling, you may need to limit how much fluid you drink (fluid restriction).  Take over-the-counter and prescription medicines only as told by your doctor. Contact a doctor if:  Treatment is not working.  You have heart, liver, or kidney disease and have symptoms of edema.  You have sudden and unexplained weight gain. Get help right away if:   You have shortness of breath or chest pain.  You cannot breathe when you lie down.  You have pain, redness, or warmth in the swollen areas.  You have heart, liver, or kidney disease and get edema all of a sudden.  You have a fever and your symptoms get worse all of a sudden. Summary  Edema is when you have too much fluid in your body or under your skin.  Edema may make your legs, feet, and ankles swell up. Swelling is also common in looser tissues, like around your eyes.  Raise (elevate) the swollen body part above the level of your heart when you are sitting or lying down.  Follow your doctor's instructions about diet and how much fluid you can drink (fluid restriction). This information is not intended to replace advice given to you by your health care provider. Make sure you discuss any questions you have with your health care provider. Document Revised: 08/22/2017 Document Reviewed: 09/06/2016 Elsevier Patient Education  2020 [employer].

## 2019-11-08 NOTE — Progress Notes (Signed)
@Patient  ID: Natasha [name], female    DOB: Sep 27, 1950, 69 y.o.   MRN: [id number]  Chief Complaint  Patient presents with  . Post COVD    Postive: 12/16 Sx: Cough, chills, Leg swelling, Fatigue    Referring provider: Lucianne Lei, MD   69 year old female with history of hypertension, hypothyroidism, left lower DVT, spinal disease, Diagnosed with Covid on 08/17/19.   Recent significant Encounters:   Hospital admission 08/21/19 - 09/06/19: Patient was admitted for COVID pneumonia.  She was treated with steroids and remdesivir.  She was also noted to have a submassive PE.  She was started on xarelto.  She noted RUE weakness and numbness as well as RLE numbness.  She's had a hx of c spine injury.  She had negative head CT x 2 for CVA.  Case was discussed with neurology recommending outpatient follow up.  Patient was discharge to SNF for rehab.   Recommendations for Outpatient Follow-up:  1. Follow outpatient CBC/CMP (Patient saw PCP on 11/03/19 - labs normal) 2. Continue quarantine until 09/08/19.  Needs 24 hours without symptoms without using fever reducing medications to d/c quarantine. (complete) 3. Continue xarelto 15 mg BID until 1/12, on 1/13 will start 20 mg daily - continue indefinitely given hx of VTE - needs at least 3 months uninterrupted, then likely indefinitely (compliant - taking as directed) 4. Follow repeat echo outpatient for low normal RV systolic function (Patient was not aware of this and has not followed up) 5. Follow with neurology outpatient, consider MRI and EMG/NCS outpatient (patient was not aware of this - did not follow up) 6. Follow blood pressure outpatient.  Holding valstartan-HCTZ.  Follow BP to see if this needs to be resumed. (Saw PCP 11/03/19 - following BP) 7. Follow with cardiology outpatient for bradycardia (Patient was not aware of this - did not follow up) 8. Follow TSH outpatient (Rechecked 11/03/19 by PCP) 9. Follow CXR outpatient (Has not done this  yet) 10. Follow pulm nodule outpatient (Was not aware of this - did not follow up yet)    HPI  Patient presents today for a post covid care visit. Patient states that she has been having ongoing issues since hospital/SNF discharge. She complains today of ongoing brain fog and memory loss. She also complains of ongoing cough. She states that it is a dry cough. She has not had a follow up chest x ray since hospital discharge. She denies shortness of breath. She also complains of peripheral edema - she saw PCP on 11/03/19 for this. Her lasix was increased from taking it every other day to now taking it daily. She states that she has been compliant with this and takes lasix daily. She has not been wearing compression hose, not following a low sodium diet, or elevating legs. She has not followed up with pulmonary, neurology, or cardiology as advised at hospital discharge (see above note). She has been compliant with Xarelto since discharge. She is actively working with PT, OT, and home health nursing. Denies f/c/s, n/v/d, hemoptysis, or chest pain. Patient has also been experiencing hand and feet numbness and tingling (see note above - history of spinal surgery) and was advised to follow with neurology at hospital discharge, but has not done this yet.         Allergies  Allergen Reactions  . Codeine Shortness Of Breath and Palpitations  . Eggs Or Egg-Derived Products Nausea And Vomiting    PROJECTILE VOMITING     There  is no immunization history on file for this patient.  Past Medical History:  Diagnosis Date  . 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 Complication of anesthesia    trouble with airway  . Edema 12/22/2008  . HYPERTENSION 12/25/2007  . HYPOTHYROIDISM, POSTSURGICAL 05/04/2008  . LEG PAIN, LEFT 12/09/2008  . OSA (obstructive  sleep apnea) 06/15/2013    Tobacco History: Social History   Tobacco Use  Smoking Status Never Smoker  Smokeless Tobacco Never Used   Counseling given: Not Answered   Outpatient Encounter Medications as of 11/08/2019  Medication Sig  . albuterol (VENTOLIN HFA) 108 (90 Base) MCG/ACT inhaler Inhale 1-2 puffs into the lungs every 6 (six) hours as needed for wheezing or shortness of breath.  Marland Kitchen amLODipine (NORVASC) 5 MG tablet Take 5 mg by mouth at bedtime.  Marland Kitchen Dextromethorphan-Guaifenesin (MUCINEX DM MAXIMUM STRENGTH) 60-1200 MG TB12 Take 1 tablet by mouth every 12 (twelve) hours.  . fexofenadine (ALLEGRA) 180 MG tablet Take 180 mg by mouth daily.  . furosemide (LASIX) 20 MG tablet Start by taking 1 tablet every other day. (Patient taking differently: Take 20 mg by mouth daily as needed for fluid. )  . levothyroxine (SYNTHROID) 150 MCG tablet Take 150 mcg by mouth daily.  . Multiple Vitamin (MULTIVITAMIN WITH MINERALS) TABS tablet Take 1 tablet by mouth daily.  . famotidine (PEPCID) 20 MG tablet Take 1 tablet (20 mg total) by mouth 2 (two) times daily.  Marland Kitchen MYRBETRIQ 25 MG TB24 tablet Take 25 mg by mouth daily.  . Rivaroxaban (XARELTO) 15 MG TABS tablet Take 1 tablet (15 mg total) by mouth 2 (two) times daily with a meal for 9 days.  . rivaroxaban (XARELTO) 20 MG TABS tablet Take 1 tablet (20 mg total) by mouth daily with supper.   No facility-administered encounter medications on file as of 11/08/2019.     Review of Systems  Review of Systems  Constitutional: Positive for activity change (decreased), chills and fatigue. Negative for appetite change, diaphoresis, fever and unexpected weight change.  HENT: Negative.   Respiratory: Positive for cough. Negative for shortness of breath and wheezing.   Cardiovascular: Positive for leg swelling.  Gastrointestinal: Negative.   Musculoskeletal: Negative for arthralgias and myalgias.  Allergic/Immunologic: Negative.   Neurological: Positive for  numbness (numbness and tingling to bilateral hands and feet).  Psychiatric/Behavioral: Positive for decreased concentration.       Physical Exam  BP 118/78 (BP Location: Left Arm, Patient Position: Sitting)   Pulse 65   Temp (!) 96 F (35.6 C)   Ht 5\' 7"  (1.702 m)   Wt (!) 319 lb (144.7 kg)   SpO2 98%   BMI 49.96 kg/m   Wt Readings from Last 5 Encounters:  11/08/19 (!) 319 lb (144.7 kg)  08/21/19 300 lb (136.1 kg)  02/27/18 297 lb (134.7 kg)  02/03/18 290 lb (131.5 kg)  09/24/17 290 lb (131.5 kg)     Physical Exam Vitals and nursing note reviewed.  Constitutional:      General: She is not in acute distress.    Appearance: Normal appearance. She is well-developed. She is obese.  HENT:     Head: Normocephalic and atraumatic.  Cardiovascular:     Rate and Rhythm: Normal rate and regular rhythm.  Pulmonary:     Effort: Pulmonary effort  is normal. No respiratory distress.     Breath sounds: Normal breath sounds. No wheezing or rhonchi.  Musculoskeletal:     Right lower leg: Edema (3+ pitting edema) present.     Left lower leg: Edema (3+ pitting edema) present.  Neurological:     Mental Status: She is alert and oriented to person, place, and time.  Psychiatric:        Mood and Affect: Mood normal.       Imaging: DG Chest 2 View  Result Date: 11/09/2019 CLINICAL DATA:  Recent COVID-19 positive.  Shortness of breath EXAM: CHEST - 2 VIEW COMPARISON:  September 02, 2019 FINDINGS: Lungs are clear. Heart size and pulmonary vascularity are normal. No adenopathy. Aorta is mildly tortuous. There is degenerative change in the thoracic spine. No evident adenopathy. There are surgical clips in the region of the thyroid as well as in the lower cervical spine. IMPRESSION: Lungs clear. Heart size within normal limits. No evident adenopathy. Electronically Signed   By: Lowella Grip III M.D.   On: 11/09/2019 08:00     Assessment & Plan:   Acute pulmonary embolism  ([place]) Assessment: Patient denies shortness of breath. O2 sats have been stable since discharge. She is compliant with Xarelto as prescribed. States that this does make her have chills.   Plan:   Will refer to pulmonary Will refer to cardiology -- needs repeat  echo  Pulmonary nodule Assessment: Nodule found on imaging in hospital  Plan:  Will refer to pulmoanary  Numbness and tingling of both feet Assessment: New onset in hospital. Negative CT x2 for CVA. Case was discussed with neurology in hospital and outpatient follow up was recommended at that time. Patient states that numbness and tingling persists in hands and feet.  Plan:  Will refer to neurology  Memory loss due to medical condition Assessment: ongoing memory loss and poor concentration since hospital discharge.   Plan:  Referral to neurology   COUGH Patient complains of ongoing cough. Lungs sound clear on exam.  Plan: Will check chest x ray  Pneumonia due to COVID-19 virus Plan: will check chest x ray today and call with results.    Patient Instructions  Pulmonary embolism: Continue Xarelto Referral placed to pulmonary Referral placed to Cardiology - follow up echo needed  Covid pneumonia: Referral placed to pulmonary Will order chest x ray  Pulmonary nodule: Referral placed to pulmonary   Edema: followed by PCP Low sodium diet Wear compression hose Keep legs elevated Continue increased dose of lasix daily  Memory loss and numbness to upper and lower extremities: Referral placed to Neurology  Keep follow up appointment with PCP  Follow up: 2 months or sooner if needed   Edema  Edema is when you have too much fluid in your body or under your skin. Edema may make your legs, feet, and ankles swell up. Swelling is also common in looser tissues, like around your eyes. This is a common condition. It gets more common as you get older. There are many possible causes of edema. Eating too much salt  (sodium) and being on your feet or sitting for a long time can cause edema in your legs, feet, and ankles. Hot weather may make edema worse. Edema is usually painless. Your skin may look swollen or shiny. Follow these instructions at home:  Keep the swollen body part raised (elevated) above the level of your heart when you are sitting or lying down.  Do not sit still or  stand for a long time.  Do not wear tight clothes. Do not wear garters on your upper legs.  Exercise your legs. This can help the swelling go down.  Wear elastic bandages or support stockings as told by your doctor.  Eat a low-salt (low-sodium) diet to reduce fluid as told by your doctor.  Depending on the cause of your swelling, you may need to limit how much fluid you drink (fluid restriction).  Take over-the-counter and prescription medicines only as told by your doctor. Contact a doctor if:  Treatment is not working.  You have heart, liver, or kidney disease and have symptoms of edema.  You have sudden and unexplained weight gain. Get help right away if:  You have shortness of breath or chest pain.  You cannot breathe when you lie down.  You have pain, redness, or warmth in the swollen areas.  You have heart, liver, or kidney disease and get edema all of a sudden.  You have a fever and your symptoms get worse all of a sudden. Summary  Edema is when you have too much fluid in your body or under your skin.  Edema may make your legs, feet, and ankles swell up. Swelling is also common in looser tissues, like around your eyes.  Raise (elevate) the swollen body part above the level of your heart when you are sitting or lying down.  Follow your doctor's instructions about diet and how much fluid you can drink (fluid restriction). This information is not intended to replace advice given to you by your health care provider. Make sure you discuss any questions you have with your health care provider. Document  Revised: 08/22/2017 Document Reviewed: 09/06/2016 Elsevier Patient Education  2020 [place], [place] 11/09/2019

## 2019-11-09 DIAGNOSIS — R200 Anesthesia of skin: Secondary | ICD-10-CM | POA: Insufficient documentation

## 2019-11-09 DIAGNOSIS — R413 Other amnesia: Secondary | ICD-10-CM | POA: Insufficient documentation

## 2019-11-09 DIAGNOSIS — R911 Solitary pulmonary nodule: Secondary | ICD-10-CM | POA: Insufficient documentation

## 2019-11-09 DIAGNOSIS — R202 Paresthesia of skin: Secondary | ICD-10-CM | POA: Insufficient documentation

## 2019-11-09 DIAGNOSIS — M6281 Muscle weakness (generalized): Secondary | ICD-10-CM | POA: Insufficient documentation

## 2019-11-09 DIAGNOSIS — Z7409 Other reduced mobility: Secondary | ICD-10-CM | POA: Insufficient documentation

## 2019-11-09 DIAGNOSIS — Z8616 Personal history of COVID-19: Secondary | ICD-10-CM

## 2019-11-09 HISTORY — DX: Personal history of COVID-19: Z86.16

## 2019-11-09 HISTORY — DX: Other amnesia: R41.3

## 2019-11-09 NOTE — Assessment & Plan Note (Signed)
Assessment: ongoing memory loss and poor concentration since hospital discharge.   Plan:  Referral to neurology

## 2019-11-09 NOTE — Assessment & Plan Note (Signed)
Plan: will check chest x ray today and call with results.

## 2019-11-09 NOTE — Assessment & Plan Note (Signed)
Assessment: Nodule found on imaging in hospital  Plan:  Will refer to [facility]

## 2019-11-09 NOTE — Assessment & Plan Note (Signed)
Patient complains of ongoing cough. Lungs sound clear on exam.  Plan: Will check chest x ray

## 2019-11-09 NOTE — Assessment & Plan Note (Signed)
Assessment: Patient denies shortness of breath. O2 sats have been stable since discharge. She is compliant with Xarelto as prescribed. States that this does make her have chills.   Plan:   Will refer to pulmonary Will refer to cardiology -- needs repeat  echo

## 2019-11-09 NOTE — Assessment & Plan Note (Signed)
Assessment: New onset in hospital. Negative CT x2 for CVA. Case was discussed with neurology in hospital and outpatient follow up was recommended at that time. Patient states that numbness and tingling persists in hands and feet.  Plan:  Will refer to neurology

## 2019-11-12 DIAGNOSIS — E890 Postprocedural hypothyroidism: Secondary | ICD-10-CM | POA: Diagnosis not present

## 2019-11-12 DIAGNOSIS — I10 Essential (primary) hypertension: Secondary | ICD-10-CM | POA: Diagnosis not present

## 2019-11-12 DIAGNOSIS — G5621 Lesion of ulnar nerve, right upper limb: Secondary | ICD-10-CM | POA: Diagnosis not present

## 2019-11-12 DIAGNOSIS — U071 COVID-19: Secondary | ICD-10-CM | POA: Diagnosis not present

## 2019-11-12 DIAGNOSIS — G4733 Obstructive sleep apnea (adult) (pediatric): Secondary | ICD-10-CM | POA: Diagnosis not present

## 2019-11-12 DIAGNOSIS — J9601 Acute respiratory failure with hypoxia: Secondary | ICD-10-CM | POA: Diagnosis not present

## 2019-11-12 DIAGNOSIS — I2699 Other pulmonary embolism without acute cor pulmonale: Secondary | ICD-10-CM | POA: Diagnosis not present

## 2019-11-12 DIAGNOSIS — J1282 Pneumonia due to coronavirus disease 2019: Secondary | ICD-10-CM | POA: Diagnosis not present

## 2019-11-14 DIAGNOSIS — E890 Postprocedural hypothyroidism: Secondary | ICD-10-CM | POA: Diagnosis not present

## 2019-11-14 DIAGNOSIS — J9601 Acute respiratory failure with hypoxia: Secondary | ICD-10-CM | POA: Diagnosis not present

## 2019-11-14 DIAGNOSIS — G5621 Lesion of ulnar nerve, right upper limb: Secondary | ICD-10-CM | POA: Diagnosis not present

## 2019-11-14 DIAGNOSIS — U071 COVID-19: Secondary | ICD-10-CM | POA: Diagnosis not present

## 2019-11-14 DIAGNOSIS — I10 Essential (primary) hypertension: Secondary | ICD-10-CM | POA: Diagnosis not present

## 2019-11-14 DIAGNOSIS — J1282 Pneumonia due to coronavirus disease 2019: Secondary | ICD-10-CM | POA: Diagnosis not present

## 2019-11-14 DIAGNOSIS — G4733 Obstructive sleep apnea (adult) (pediatric): Secondary | ICD-10-CM | POA: Diagnosis not present

## 2019-11-14 DIAGNOSIS — I2699 Other pulmonary embolism without acute cor pulmonale: Secondary | ICD-10-CM | POA: Diagnosis not present

## 2019-11-16 DIAGNOSIS — G5621 Lesion of ulnar nerve, right upper limb: Secondary | ICD-10-CM | POA: Diagnosis not present

## 2019-11-16 DIAGNOSIS — E890 Postprocedural hypothyroidism: Secondary | ICD-10-CM | POA: Diagnosis not present

## 2019-11-16 DIAGNOSIS — I2699 Other pulmonary embolism without acute cor pulmonale: Secondary | ICD-10-CM | POA: Diagnosis not present

## 2019-11-16 DIAGNOSIS — G4733 Obstructive sleep apnea (adult) (pediatric): Secondary | ICD-10-CM | POA: Diagnosis not present

## 2019-11-16 DIAGNOSIS — U071 COVID-19: Secondary | ICD-10-CM | POA: Diagnosis not present

## 2019-11-16 DIAGNOSIS — J1282 Pneumonia due to coronavirus disease 2019: Secondary | ICD-10-CM | POA: Diagnosis not present

## 2019-11-16 DIAGNOSIS — I10 Essential (primary) hypertension: Secondary | ICD-10-CM | POA: Diagnosis not present

## 2019-11-16 DIAGNOSIS — J9601 Acute respiratory failure with hypoxia: Secondary | ICD-10-CM | POA: Diagnosis not present

## 2019-11-17 ENCOUNTER — Telehealth: Payer: Self-pay | Admitting: Radiology

## 2019-11-17 ENCOUNTER — Other Ambulatory Visit: Payer: Self-pay

## 2019-11-17 ENCOUNTER — Ambulatory Visit (INDEPENDENT_AMBULATORY_CARE_PROVIDER_SITE_OTHER): Payer: Medicare HMO | Admitting: Cardiology

## 2019-11-17 ENCOUNTER — Encounter: Payer: Self-pay | Admitting: Cardiology

## 2019-11-17 VITALS — BP 125/71 | HR 72 | Temp 97.4°F | Wt 311.0 lb

## 2019-11-17 DIAGNOSIS — Z9989 Dependence on other enabling machines and devices: Secondary | ICD-10-CM

## 2019-11-17 DIAGNOSIS — Z8616 Personal history of COVID-19: Secondary | ICD-10-CM

## 2019-11-17 DIAGNOSIS — R001 Bradycardia, unspecified: Secondary | ICD-10-CM

## 2019-11-17 DIAGNOSIS — Z86711 Personal history of pulmonary embolism: Secondary | ICD-10-CM | POA: Diagnosis not present

## 2019-11-17 DIAGNOSIS — G4733 Obstructive sleep apnea (adult) (pediatric): Secondary | ICD-10-CM

## 2019-11-17 DIAGNOSIS — I10 Essential (primary) hypertension: Secondary | ICD-10-CM

## 2019-11-17 DIAGNOSIS — Z7189 Other specified counseling: Secondary | ICD-10-CM

## 2019-11-17 DIAGNOSIS — I50810 Right heart failure, unspecified: Secondary | ICD-10-CM | POA: Diagnosis not present

## 2019-11-17 NOTE — Progress Notes (Signed)
Cardiology Office Note:    Date:  11/17/2019   ID:  Natasha [name], DOB 1950-11-06, MRN [id number]  PCP:  Lucianne Lei, MD  Cardiologist:  Buford Dresser, MD  Referring MD: Fenton Foy, NP   CC: new patient evaluation for post-Covid/post-PE management and bradycardia  History of Present Illness:    Natasha [name] is a 69 y.o. female with a hx of Covid-19 infection in 08/2019, pulmonary embolism, hypertension, hyperthyroidism who is seen as a new consult at the request of Fenton Foy, NP for the evaluation and management of post-Covid/post-PE management and bradycardia.  Note from Lazaro Arms, NP reviewed from 11/08/19. She required hospitalization for Covid pneumonia during her admission from 08/21/19-09/06/19. She received steroids and remdesivir. During her admission, she was diagnosed with submassive PE and started on rivaroxaban. She was recommended for cardiology post discharge follow up for bradycardia and monitoring of RV function.  She was seen by Dr. Radford Pax during her admission, note dated 08/23/19. Noted to have sinus bradycardia in the 40s and 50s.   Today: Reviewed her presentation and recent hospitalization. Noted during her admission to have sinus bradycardia, predominantly during sleep. Recommended for outpatient follow up. She has sleep apnea but was not able to use her CPAP while admitted. Now using CPAP at home.   Pulse went to 39, blood oxygen was in the upper 80s at home prior to her presenting to the ER for Covid. She moved around and got her pulse to come up. Prior to covid, had never had low pulse rate before.  Weight is up 20 lbs, was 292 lbs when she went to rehab. Recently started on lasix daily instead of every other day. She feels this may have made a small improvement. We discussed compression stockings and leg elevation at length today, she will work on this at home. Discussed sodium in the diet, ans she is avoiding this greatly.  Continues to  have central and left breast pain, not related to exertion. Has had costochondritis before, though this feels somewhat different. Also has shortness of breath at rest and with minimal exertion. No PND/orthopnea. Does have chronic LE edema, both pitting and nonpitting.  Has physical therapy coming twice/week and occupational therapy once/week. Has been a long road for her, and we discussed the Covid recovery timeline at length today.  Past Medical History:  Diagnosis Date  . 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 Complication of anesthesia    trouble with airway  . Edema 12/22/2008  . HYPERTENSION 12/25/2007  . HYPOTHYROIDISM, POSTSURGICAL 05/04/2008  . LEG PAIN, LEFT 12/09/2008  . OSA (obstructive sleep apnea) 06/15/2013    Past Surgical History:  Procedure Laterality Date  . ABDOMINAL HYSTERECTOMY    . ANKLE SURGERY    . CERVICAL FUSION     x 2  . COLONOSCOPY WITH PROPOFOL N/A 08/08/2015   Procedure: COLONOSCOPY WITH PROPOFOL;  Surgeon: Juanita Craver, MD;  Location: [facility];  Service: Endoscopy;  Laterality: N/A;  . KNEE SURGERY    . TOTAL THYROIDECTOMY      Current Medications: Current Outpatient Medications on File Prior to Visit  Medication Sig  . albuterol (VENTOLIN HFA) 108 (90 Base) MCG/ACT inhaler Inhale 1-2 puffs into the lungs every 6 (six) hours as needed for wheezing or shortness of breath.  Marland Kitchen  amLODipine (NORVASC) 5 MG tablet Take 5 mg by mouth at bedtime.  Marland Kitchen Dextromethorphan-Guaifenesin (MUCINEX DM MAXIMUM STRENGTH) 60-1200 MG TB12 Take 1 tablet by mouth every 12 (twelve) hours.  . fexofenadine (ALLEGRA) 180 MG tablet Take 180 mg by mouth daily.  . furosemide (LASIX) 20 MG tablet Start by taking 1 tablet every other day. (Patient taking differently: Take by mouth daily. Start by taking 1 tablet every other day.)    . levothyroxine (SYNTHROID) 150 MCG tablet Take 150 mcg by mouth daily.  . Multiple Vitamin (MULTIVITAMIN WITH MINERALS) TABS tablet Take 1 tablet by mouth daily.  Marland Kitchen MYRBETRIQ 25 MG TB24 tablet Take 25 mg by mouth daily.  . rivaroxaban (XARELTO) 20 MG TABS tablet Take 1 tablet (20 mg total) by mouth daily with supper.  . famotidine (PEPCID) 20 MG tablet Take 1 tablet (20 mg total) by mouth 2 (two) times daily.   No current facility-administered medications on file prior to visit.     Allergies:   Codeine and Eggs or egg-derived products   Social History   Tobacco Use  . Smoking status: Never Smoker  . Smokeless tobacco: Never Used  Substance Use Topics  . Alcohol use: Yes    Comment: 5 drinks/year  . Drug use: No    Family History: family history includes Heart disease in her mother; Kidney disease in her mother.  ROS:   Please see the history of present illness.  Additional pertinent ROS: Constitutional: Negative for chills, fever, night sweats, unintentional weight loss. Positive for constantly feeling cold. HENT: Negative for ear pain and hearing loss.   Eyes: Negative for loss of vision and eye pain.  Respiratory: Positive for continued dry cough  Cardiovascular: See HPI. Gastrointestinal: Negative for abdominal pain, melena, and hematochezia.  Genitourinary: Negative for dysuria and hematuria. Positive for frequency/incontinence Musculoskeletal: Negative for falls and myalgias.  Skin: Negative for itching and rash.  Neurological: Negative for focal weakness, focal sensory changes and loss of consciousness.  Endo/Heme/Allergies: Does not bruise/bleed easily.     EKGs/Labs/Other Studies Reviewed:    The following studies were reviewed today: Echo 08/22/19 1. Left ventricular ejection fraction, by visual estimation, is 65 to  70%. The left ventricle has hyperdynamic function. There is no left  ventricular hypertrophy.  2. The left ventricle has no regional wall  motion abnormalities.  3. Global right ventricle has low normal systolic function.The right  ventricular size is mildly enlarged. No increase in right ventricular wall  thickness.  4. Presence of pericardial fat pad.  5. The mitral valve is grossly normal. Trivial mitral valve  regurgitation.  6. The tricuspid valve is grossly normal. Tricuspid valve regurgitation  is not demonstrated.  7. The tricuspid valve was normal in structure. Tricuspid valve  regurgitation is not demonstrated.  8. The aortic valve is grossly normal. Aortic valve regurgitation is not  visualized. No evidence of aortic valve sclerosis or stenosis.  9. The inferior vena cava is dilated in size with <50% respiratory  variability, suggesting right atrial pressure of 15 mmHg.  10. Technically challenging study. Normal LV function. RV appears to be  mildly enlarged, with borderline normal function. Unable to visualize PA  well.  11. The interatrial septum was not assessed.   EKG:  EKG is personally reviewed.  The ekg ordered today demonstrates NSR  Recent Labs: 09/02/2019: TSH 0.288 09/03/2019: B Natriuretic Peptide 30.7 09/05/2019: ALT 16; BUN 14; Creatinine, Ser 0.75; Hemoglobin 11.7; Magnesium 2.2; Platelets 232; Potassium 4.0; Sodium  136  Recent Lipid Panel    Component Value Date/Time   CHOL 174 09/04/2019 1230   TRIG 89 09/04/2019 1230   HDL 76 09/04/2019 1230   CHOLHDL 2.3 09/04/2019 1230   VLDL 18 09/04/2019 1230   LDLCALC 80 09/04/2019 1230    Physical Exam:    VS:  BP 125/71   Pulse 72   Temp (!) 97.4 F (36.3 C)   Wt (!) 311 lb (141.1 kg)   SpO2 96%   BMI 48.71 kg/m     Wt Readings from Last 3 Encounters:  11/17/19 (!) 311 lb (141.1 kg)  11/08/19 (!) 319 lb (144.7 kg)  08/21/19 300 lb (136.1 kg)    GEN: Well nourished, well developed in no acute distress HEENT: Normal, moist mucous membranes NECK: No JVD visible CARDIAC: regular rhythm, normal S1 and S2, no rubs or gallops. No  murmurs. VASCULAR: Radial and DP pulses 2+ bilaterally. No carotid bruits RESPIRATORY:  Clear to auscultation without rales, wheezing or rhonchi  ABDOMEN: Soft, non-tender, non-distended MUSCULOSKELETAL:  Ambulates independently SKIN: Warm and dry, bilateral trace pitting with nonpitting edema to knees bilaterally NEUROLOGIC:  Alert and oriented x 3. No focal neuro deficits noted. PSYCHIATRIC:  Normal affect    ASSESSMENT:    1. History of 2019 novel coronavirus disease (COVID-19)   2. History of pulmonary embolism   3. Essential hypertension   4. Bradycardia   5. Right-sided heart failure, unspecified HF chronicity ([place])   6. OSA on CPAP   7. Cardiac risk counseling   8. Counseling on health promotion and disease prevention    PLAN:    History of Covid pneumonia and pulmonary embolism, with evidence of right sided heart failure: -continues to have cough, shortness of breath, and LE edema -will repeat echocardiogram as right ventricle was abnormal during admission. Need to exclude right heart failure and pulmonary hypertension -counseled on need to continue using CPAP for OSA -counseled on daily weights, salt avoidance -if elevated right sided pressures, may need to increase lasix -counseled on compression stockings and leg elevation -remains on rivaroxaban, likely lifelong based on notes  Hypertension: well controlled today -continued amlodipine. Do not suspect that 5 mg dose is contributing significantly to LE edema  Bradycardia: in the setting of respiratory illness and OSA -will get 7 day event monitor to exclude high degree block or pauses  Cardiac risk counseling and prevention recommendations: -recommend heart healthy/Mediterranean diet, with whole grains, fruits, vegetable, fish, lean meats, nuts, and olive oil. Limit salt. -recommend moderate walking, 3-5 times/week for 30-50 minutes each session. Aim for at least 150 minutes.week. Goal should be pace of 3 miles/hours,  or walking 1.5 miles in 30 minutes. She is very deconditioned and limited in her ability to exercise currently. -recommend avoidance of tobacco products. Avoid excess alcohol. -ASCVD risk score: The 10-year ASCVD risk score Mikey Bussing DC Brooke Bonito., et al., 2013) is: 21.6%   Values used to calculate the score:     Age: 75 years     Sex: Female     Is Non-Hispanic African American: Yes     Diabetic: Yes     Tobacco smoker: No     Systolic Blood Pressure: 0000000 mmHg     Is BP treated: Yes     HDL Cholesterol: 76 mg/dL     Total Cholesterol: 174 mg/dL    Plan for follow up: 3 months or sooner as needed  Total time of encounter: 57 minutes total time of encounter, including  52 minutes spent in face-to-face patient care. This time includes coordination of care and counseling regarding bradycardia, covid, right heart failure. Remainder of non-face-to-face time involved reviewing chart documents/testing relevant to the patient encounter and documentation in the medical record.  Buford Dresser, MD, PhD [facility]   She has requested referral to a PCP in the [facility] EMR; this referral was made today.  Medication Adjustments/Labs and Tests Ordered: Current medicines are reviewed at length with the patient today.  Concerns regarding medicines are outlined above.  Orders Placed This Encounter  Procedures  . Ambulatory referral to Internal Medicine  . LONG TERM MONITOR (3-14 DAYS)  . EKG 12-Lead  . ECHOCARDIOGRAM COMPLETE   No orders of the defined types were placed in this encounter.   Patient Instructions  Medication Instructions:  Your Physician recommend you continue on your current medication as directed.    *If you need a refill on your cardiac medications before your next appointment, please call your pharmacy*   Lab Work: None   Testing/Procedures: Your physician has requested that you have an echocardiogram. Echocardiography is a painless test that uses sound  waves to create images of your heart. It provides your doctor with information about the size and shape of your heart and how well your heart's chambers and valves are working. This procedure takes approximately one hour. There are no restrictions for this procedure. [place] [address]  Our physician has recommended that you wear an  7 DAY ZIO-PATCH monitor. The Zio patch cardiac monitor continuously records heart rhythm data for up to 14 days, this is for patients being evaluated for multiple types heart rhythms. For the first 24 hours post application, please avoid getting the Zio monitor wet in the shower or by excessive sweating during exercise. After that, feel free to carry on with regular activities. Keep soaps and lotions away from the ZIO XT Patch.   Someone will call to mail monitor.      Follow-Up: At [facility] you and your health needs are our priority.  As part of our continuing mission to provide you with exceptional heart care, we have created designated Provider Care Teams.  These Care Teams include your primary Cardiologist (physician) and Advanced Practice Providers (APPs -  Physician Assistants and Nurse Practitioners) who all work together to provide you with the care you need, when you need it.  We recommend signing up for the patient portal called "MyChart".  Sign up information is provided on this After Visit Summary.  MyChart is used to connect with patients for Virtual Visits (Telemedicine).  Patients are able to view lab/test results, encounter notes, upcoming appointments, etc.  Non-urgent messages can be sent to your provider as well.   To learn more about what you can do with MyChart, go to [email or web address].    Your next appointment:   3 month(s)  The format for your next appointment:   In Person  Provider:   Buford Dresser, MD  [place] Instructions   Your physician has requested you wear your ZIO patch  monitor___7____days.   This is a single patch monitor.  [employer] supplies one patch monitor per enrollment.  Additional stickers are not available.   Please do not apply patch if you will be having a Nuclear Stress Test, Echocardiogram, Cardiac CT, MRI, or Chest Xray during the time frame you would be wearing the monitor. The patch cannot be worn during these tests.  You  cannot remove and re-apply the ZIO XT patch monitor.   Your ZIO patch monitor will be sent USPS Priority mail from [facility] directly to your home address. The monitor may also be mailed to a PO BOX if home delivery is not available.   It may take 3-5 days to receive your monitor after you have been enrolled.   Once you have received you monitor, please review enclosed instructions.  Your monitor has already been registered assigning a specific monitor serial # to you.   Applying the monitor   Shave hair from upper left chest.   Hold abrader disc by orange tab.  Rub abrader in 40 strokes over left upper chest as indicated in your monitor instructions.   Clean area with 4 enclosed alcohol pads .  Use all pads to assure are is cleaned thoroughly.  Let dry.   Apply patch as indicated in monitor instructions.  Patch will be place under collarbone on left side of chest with arrow pointing upward.   Rub patch adhesive wings for 2 minutes.Remove white label marked "1".  Remove white label marked "2".  Rub patch adhesive wings for 2 additional minutes.   While looking in a mirror, press and release button in center of patch.  A small green light will flash 3-4 times .  This will be your only indicator the monitor has been turned on.     Do not shower for the first 24 hours.  You may shower after the first 24 hours.   Press button if you feel a symptom. You will hear a small click.  Record Date, Time and Symptom in the Patient Log Book.   When you are ready to remove patch, follow instructions on last 2 pages of Patient  Log Book.  Stick patch monitor onto last page of Patient Log Book.   Place Patient Log Book in [place] box.  Use locking tab on box and tape box closed securely.  The [employer] and [employer] has [employer] on it.  Please place in mailbox as soon as possible.  Your physician should have your test results approximately 7 days after the monitor has been mailed back to [facility].   Call [place] at [phone] if you have questions regarding your ZIO XT patch monitor.  Call them immediately if you see an orange light blinking on your monitor.   If your monitor falls off in less than 4 days contact our Monitor department at [phone].  If your monitor becomes loose or falls off after 4 days call [employer] at [phone] for suggestions on securing your monitor.     Signed, Buford Dresser, MD PhD 11/17/2019 3:42 PM    [place]

## 2019-11-17 NOTE — Patient Instructions (Signed)
Medication Instructions:  Your Physician recommend you continue on your current medication as directed.    *If you need a refill on your cardiac medications before your next appointment, please call your pharmacy*   Lab Work: None   Testing/Procedures: Your physician has requested that you have an echocardiogram. Echocardiography is a painless test that uses sound waves to create images of your heart. It provides your doctor with information about the size and shape of your heart and how well your heart's chambers and valves are working. This procedure takes approximately one hour. There are no restrictions for this procedure. [place] [address]  Our physician has recommended that you wear an  7 DAY ZIO-PATCH monitor. The Zio patch cardiac monitor continuously records heart rhythm data for up to 14 days, this is for patients being evaluated for multiple types heart rhythms. For the first 24 hours post application, please avoid getting the Zio monitor wet in the shower or by excessive sweating during exercise. After that, feel free to carry on with regular activities. Keep soaps and lotions away from the ZIO XT Patch.   Someone will call to mail monitor.      Follow-Up: At [facility], you and your health needs are our priority.  As part of our continuing mission to provide you with exceptional heart care, we have created designated Provider Care Teams.  These Care Teams include your primary Cardiologist (physician) and Advanced Practice Providers (APPs -  Physician Assistants and Nurse Practitioners) who all work together to provide you with the care you need, when you need it.  We recommend signing up for the patient portal called "MyChart".  Sign up information is provided on this After Visit Summary.  MyChart is used to connect with patients for Virtual Visits (Telemedicine).  Patients are able to view lab/test results, encounter notes, upcoming appointments, etc.   Non-urgent messages can be sent to your provider as well.   To learn more about what you can do with MyChart, go to [email or web address].    Your next appointment:   3 month(s)  The format for your next appointment:   In Person  Provider:   Buford Dresser, MD  [place] Instructions   Your physician has requested you wear your ZIO patch monitor___7____days.   This is a single patch monitor.  [employer] supplies one patch monitor per enrollment.  Additional stickers are not available.   Please do not apply patch if you will be having a Nuclear Stress Test, Echocardiogram, Cardiac CT, MRI, or Chest Xray during the time frame you would be wearing the monitor. The patch cannot be worn during these tests.  You cannot remove and re-apply the ZIO XT patch monitor.   Your ZIO patch monitor will be sent USPS Priority mail from [facility] directly to your home address. The monitor may also be mailed to a PO BOX if home delivery is not available.   It may take 3-5 days to receive your monitor after you have been enrolled.   Once you have received you monitor, please review enclosed instructions.  Your monitor has already been registered assigning a specific monitor serial # to you.   Applying the monitor   Shave hair from upper left chest.   Hold abrader disc by orange tab.  Rub abrader in 40 strokes over left upper chest as indicated in your monitor instructions.   Clean area with 4 enclosed alcohol pads .  Use all pads  to assure are is cleaned thoroughly.  Let dry.   Apply patch as indicated in monitor instructions.  Patch will be place under collarbone on left side of chest with arrow pointing upward.   Rub patch adhesive wings for 2 minutes.Remove white label marked "1".  Remove white label marked "2".  Rub patch adhesive wings for 2 additional minutes.   While looking in a mirror, press and release button in center of patch.  A small green light will  flash 3-4 times .  This will be your only indicator the monitor has been turned on.     Do not shower for the first 24 hours.  You may shower after the first 24 hours.   Press button if you feel a symptom. You will hear a small click.  Record Date, Time and Symptom in the Patient Log Book.   When you are ready to remove patch, follow instructions on last 2 pages of Patient Log Book.  Stick patch monitor onto last page of Patient Log Book.   Place Patient Log Book in [place] box.  Use locking tab on box and tape box closed securely.  The Orange and [employer] has [employer] on it.  Please place in mailbox as soon as possible.  Your physician should have your test results approximately 7 days after the monitor has been mailed back to [facility].   Call [place] at [phone] if you have questions regarding your ZIO XT patch monitor.  Call them immediately if you see an orange light blinking on your monitor.   If your monitor falls off in less than 4 days contact our Monitor department at [phone].  If your monitor becomes loose or falls off after 4 days call [employer] at [phone] for suggestions on securing your monitor.

## 2019-11-17 NOTE — Telephone Encounter (Signed)
Enrolled patient for a 7 day Zio monitor to be mailed to patients home.  

## 2019-11-18 ENCOUNTER — Encounter: Payer: Self-pay | Admitting: Cardiology

## 2019-11-18 DIAGNOSIS — J9601 Acute respiratory failure with hypoxia: Secondary | ICD-10-CM | POA: Diagnosis not present

## 2019-11-18 DIAGNOSIS — G4733 Obstructive sleep apnea (adult) (pediatric): Secondary | ICD-10-CM | POA: Diagnosis not present

## 2019-11-18 DIAGNOSIS — U071 COVID-19: Secondary | ICD-10-CM | POA: Diagnosis not present

## 2019-11-18 DIAGNOSIS — G5621 Lesion of ulnar nerve, right upper limb: Secondary | ICD-10-CM | POA: Diagnosis not present

## 2019-11-18 DIAGNOSIS — I10 Essential (primary) hypertension: Secondary | ICD-10-CM | POA: Diagnosis not present

## 2019-11-18 DIAGNOSIS — J1282 Pneumonia due to coronavirus disease 2019: Secondary | ICD-10-CM | POA: Diagnosis not present

## 2019-11-18 DIAGNOSIS — I2699 Other pulmonary embolism without acute cor pulmonale: Secondary | ICD-10-CM | POA: Diagnosis not present

## 2019-11-18 DIAGNOSIS — E890 Postprocedural hypothyroidism: Secondary | ICD-10-CM | POA: Diagnosis not present

## 2019-11-19 DIAGNOSIS — E890 Postprocedural hypothyroidism: Secondary | ICD-10-CM | POA: Diagnosis not present

## 2019-11-19 DIAGNOSIS — G4733 Obstructive sleep apnea (adult) (pediatric): Secondary | ICD-10-CM | POA: Diagnosis not present

## 2019-11-19 DIAGNOSIS — J9601 Acute respiratory failure with hypoxia: Secondary | ICD-10-CM | POA: Diagnosis not present

## 2019-11-19 DIAGNOSIS — I2699 Other pulmonary embolism without acute cor pulmonale: Secondary | ICD-10-CM | POA: Diagnosis not present

## 2019-11-19 DIAGNOSIS — I10 Essential (primary) hypertension: Secondary | ICD-10-CM | POA: Diagnosis not present

## 2019-11-19 DIAGNOSIS — J1282 Pneumonia due to coronavirus disease 2019: Secondary | ICD-10-CM | POA: Diagnosis not present

## 2019-11-19 DIAGNOSIS — G5621 Lesion of ulnar nerve, right upper limb: Secondary | ICD-10-CM | POA: Diagnosis not present

## 2019-11-19 DIAGNOSIS — U071 COVID-19: Secondary | ICD-10-CM | POA: Diagnosis not present

## 2019-11-22 DIAGNOSIS — G4733 Obstructive sleep apnea (adult) (pediatric): Secondary | ICD-10-CM | POA: Diagnosis not present

## 2019-11-22 DIAGNOSIS — I2699 Other pulmonary embolism without acute cor pulmonale: Secondary | ICD-10-CM | POA: Diagnosis not present

## 2019-11-22 DIAGNOSIS — E890 Postprocedural hypothyroidism: Secondary | ICD-10-CM | POA: Diagnosis not present

## 2019-11-22 DIAGNOSIS — G5621 Lesion of ulnar nerve, right upper limb: Secondary | ICD-10-CM | POA: Diagnosis not present

## 2019-11-22 DIAGNOSIS — J1282 Pneumonia due to coronavirus disease 2019: Secondary | ICD-10-CM | POA: Diagnosis not present

## 2019-11-22 DIAGNOSIS — J9601 Acute respiratory failure with hypoxia: Secondary | ICD-10-CM | POA: Diagnosis not present

## 2019-11-22 DIAGNOSIS — I10 Essential (primary) hypertension: Secondary | ICD-10-CM | POA: Diagnosis not present

## 2019-11-22 DIAGNOSIS — U071 COVID-19: Secondary | ICD-10-CM | POA: Diagnosis not present

## 2019-11-24 ENCOUNTER — Other Ambulatory Visit (INDEPENDENT_AMBULATORY_CARE_PROVIDER_SITE_OTHER): Payer: Medicare HMO

## 2019-11-24 DIAGNOSIS — R001 Bradycardia, unspecified: Secondary | ICD-10-CM | POA: Diagnosis not present

## 2019-11-24 DIAGNOSIS — G4733 Obstructive sleep apnea (adult) (pediatric): Secondary | ICD-10-CM | POA: Diagnosis not present

## 2019-11-24 DIAGNOSIS — U071 COVID-19: Secondary | ICD-10-CM | POA: Diagnosis not present

## 2019-11-24 DIAGNOSIS — I2699 Other pulmonary embolism without acute cor pulmonale: Secondary | ICD-10-CM | POA: Diagnosis not present

## 2019-11-24 DIAGNOSIS — G5621 Lesion of ulnar nerve, right upper limb: Secondary | ICD-10-CM | POA: Diagnosis not present

## 2019-11-24 DIAGNOSIS — J1282 Pneumonia due to coronavirus disease 2019: Secondary | ICD-10-CM | POA: Diagnosis not present

## 2019-11-24 DIAGNOSIS — J9601 Acute respiratory failure with hypoxia: Secondary | ICD-10-CM | POA: Diagnosis not present

## 2019-11-24 DIAGNOSIS — E890 Postprocedural hypothyroidism: Secondary | ICD-10-CM | POA: Diagnosis not present

## 2019-11-24 DIAGNOSIS — I10 Essential (primary) hypertension: Secondary | ICD-10-CM | POA: Diagnosis not present

## 2019-11-29 DIAGNOSIS — I2699 Other pulmonary embolism without acute cor pulmonale: Secondary | ICD-10-CM | POA: Diagnosis not present

## 2019-11-29 DIAGNOSIS — I10 Essential (primary) hypertension: Secondary | ICD-10-CM | POA: Diagnosis not present

## 2019-11-29 DIAGNOSIS — E890 Postprocedural hypothyroidism: Secondary | ICD-10-CM | POA: Diagnosis not present

## 2019-11-29 DIAGNOSIS — G4733 Obstructive sleep apnea (adult) (pediatric): Secondary | ICD-10-CM | POA: Diagnosis not present

## 2019-11-29 DIAGNOSIS — U071 COVID-19: Secondary | ICD-10-CM | POA: Diagnosis not present

## 2019-11-29 DIAGNOSIS — J9601 Acute respiratory failure with hypoxia: Secondary | ICD-10-CM | POA: Diagnosis not present

## 2019-11-29 DIAGNOSIS — J1282 Pneumonia due to coronavirus disease 2019: Secondary | ICD-10-CM | POA: Diagnosis not present

## 2019-11-29 DIAGNOSIS — G5621 Lesion of ulnar nerve, right upper limb: Secondary | ICD-10-CM | POA: Diagnosis not present

## 2019-12-01 DIAGNOSIS — G5621 Lesion of ulnar nerve, right upper limb: Secondary | ICD-10-CM | POA: Diagnosis not present

## 2019-12-01 DIAGNOSIS — J9601 Acute respiratory failure with hypoxia: Secondary | ICD-10-CM | POA: Diagnosis not present

## 2019-12-01 DIAGNOSIS — G4733 Obstructive sleep apnea (adult) (pediatric): Secondary | ICD-10-CM | POA: Diagnosis not present

## 2019-12-01 DIAGNOSIS — U071 COVID-19: Secondary | ICD-10-CM | POA: Diagnosis not present

## 2019-12-01 DIAGNOSIS — I10 Essential (primary) hypertension: Secondary | ICD-10-CM | POA: Diagnosis not present

## 2019-12-01 DIAGNOSIS — J1282 Pneumonia due to coronavirus disease 2019: Secondary | ICD-10-CM | POA: Diagnosis not present

## 2019-12-01 DIAGNOSIS — E890 Postprocedural hypothyroidism: Secondary | ICD-10-CM | POA: Diagnosis not present

## 2019-12-01 DIAGNOSIS — I2699 Other pulmonary embolism without acute cor pulmonale: Secondary | ICD-10-CM | POA: Diagnosis not present

## 2019-12-02 DIAGNOSIS — U071 COVID-19: Secondary | ICD-10-CM | POA: Diagnosis not present

## 2019-12-02 DIAGNOSIS — G4733 Obstructive sleep apnea (adult) (pediatric): Secondary | ICD-10-CM | POA: Diagnosis not present

## 2019-12-02 DIAGNOSIS — I2699 Other pulmonary embolism without acute cor pulmonale: Secondary | ICD-10-CM | POA: Diagnosis not present

## 2019-12-02 DIAGNOSIS — E890 Postprocedural hypothyroidism: Secondary | ICD-10-CM | POA: Diagnosis not present

## 2019-12-02 DIAGNOSIS — J9601 Acute respiratory failure with hypoxia: Secondary | ICD-10-CM | POA: Diagnosis not present

## 2019-12-02 DIAGNOSIS — I10 Essential (primary) hypertension: Secondary | ICD-10-CM | POA: Diagnosis not present

## 2019-12-02 DIAGNOSIS — J1282 Pneumonia due to coronavirus disease 2019: Secondary | ICD-10-CM | POA: Diagnosis not present

## 2019-12-02 DIAGNOSIS — G5621 Lesion of ulnar nerve, right upper limb: Secondary | ICD-10-CM | POA: Diagnosis not present

## 2019-12-07 ENCOUNTER — Other Ambulatory Visit (HOSPITAL_COMMUNITY): Payer: Medicare HMO

## 2019-12-07 DIAGNOSIS — G4733 Obstructive sleep apnea (adult) (pediatric): Secondary | ICD-10-CM | POA: Diagnosis not present

## 2019-12-07 DIAGNOSIS — G5621 Lesion of ulnar nerve, right upper limb: Secondary | ICD-10-CM | POA: Diagnosis not present

## 2019-12-07 DIAGNOSIS — E890 Postprocedural hypothyroidism: Secondary | ICD-10-CM | POA: Diagnosis not present

## 2019-12-07 DIAGNOSIS — J9601 Acute respiratory failure with hypoxia: Secondary | ICD-10-CM | POA: Diagnosis not present

## 2019-12-07 DIAGNOSIS — I10 Essential (primary) hypertension: Secondary | ICD-10-CM | POA: Diagnosis not present

## 2019-12-07 DIAGNOSIS — I2699 Other pulmonary embolism without acute cor pulmonale: Secondary | ICD-10-CM | POA: Diagnosis not present

## 2019-12-07 DIAGNOSIS — J1282 Pneumonia due to coronavirus disease 2019: Secondary | ICD-10-CM | POA: Diagnosis not present

## 2019-12-07 DIAGNOSIS — U071 COVID-19: Secondary | ICD-10-CM | POA: Diagnosis not present

## 2019-12-08 DIAGNOSIS — G4733 Obstructive sleep apnea (adult) (pediatric): Secondary | ICD-10-CM | POA: Diagnosis not present

## 2019-12-08 DIAGNOSIS — U071 COVID-19: Secondary | ICD-10-CM | POA: Diagnosis not present

## 2019-12-08 DIAGNOSIS — G5621 Lesion of ulnar nerve, right upper limb: Secondary | ICD-10-CM | POA: Diagnosis not present

## 2019-12-08 DIAGNOSIS — E890 Postprocedural hypothyroidism: Secondary | ICD-10-CM | POA: Diagnosis not present

## 2019-12-08 DIAGNOSIS — I2699 Other pulmonary embolism without acute cor pulmonale: Secondary | ICD-10-CM | POA: Diagnosis not present

## 2019-12-08 DIAGNOSIS — J1282 Pneumonia due to coronavirus disease 2019: Secondary | ICD-10-CM | POA: Diagnosis not present

## 2019-12-08 DIAGNOSIS — I10 Essential (primary) hypertension: Secondary | ICD-10-CM | POA: Diagnosis not present

## 2019-12-08 DIAGNOSIS — J9601 Acute respiratory failure with hypoxia: Secondary | ICD-10-CM | POA: Diagnosis not present

## 2019-12-09 DIAGNOSIS — J9601 Acute respiratory failure with hypoxia: Secondary | ICD-10-CM | POA: Diagnosis not present

## 2019-12-09 DIAGNOSIS — E890 Postprocedural hypothyroidism: Secondary | ICD-10-CM | POA: Diagnosis not present

## 2019-12-09 DIAGNOSIS — J1282 Pneumonia due to coronavirus disease 2019: Secondary | ICD-10-CM | POA: Diagnosis not present

## 2019-12-09 DIAGNOSIS — U071 COVID-19: Secondary | ICD-10-CM | POA: Diagnosis not present

## 2019-12-09 DIAGNOSIS — I10 Essential (primary) hypertension: Secondary | ICD-10-CM | POA: Diagnosis not present

## 2019-12-09 DIAGNOSIS — G4733 Obstructive sleep apnea (adult) (pediatric): Secondary | ICD-10-CM | POA: Diagnosis not present

## 2019-12-09 DIAGNOSIS — I2699 Other pulmonary embolism without acute cor pulmonale: Secondary | ICD-10-CM | POA: Diagnosis not present

## 2019-12-09 DIAGNOSIS — G5621 Lesion of ulnar nerve, right upper limb: Secondary | ICD-10-CM | POA: Diagnosis not present

## 2019-12-10 ENCOUNTER — Telehealth: Payer: Self-pay | Admitting: Cardiology

## 2019-12-10 DIAGNOSIS — Z79899 Other long term (current) drug therapy: Secondary | ICD-10-CM

## 2019-12-10 NOTE — Telephone Encounter (Signed)
w Message    Pt c/o swelling: STAT is pt has developed SOB within 24 hours  1) How much weight have you gained and in what time span? Gained about 12 pounds   2) If swelling, where is the swelling located? Feet and Legs   3) Are you currently taking a fluid pill? Lasix   4) Are you currently SOB? No   5) Do you have a log of your daily weights (if so, list)? Yes pts nurse advised her to start keeping track so she is going too   6) Have you gained 3 pounds in a day or 5 pounds in a week? 5 pounds in a week    Last week she gained 7 pounds in a week   7) Have you traveled recently? No     Pt says her home nurse came to see her yesterday and says she has Edema in the legs. Her home nurse recommended her calling to let her cardiologist know and is also recommending she has her Lasix increased. She is currently taking 20 mg daily    Please call back

## 2019-12-10 NOTE — Telephone Encounter (Signed)
Pt states she has gained 7lbs this week. She denies SOB, CP or palpitations. Her main complaint is LE edema. She has been taking 20mg  lasix qd and has not missed a dose. Advised pt to take an additional dose today, reduce her sodium and fluid intake, and elevate legs while sitting.   Forwarding to Dr. Harrell Gave for additional recommendation. Pt understands we will contact her back with any additional recommendation from Dr. Harrell Gave.   3 mo follow up appointment was made per patients request as well.

## 2019-12-13 DIAGNOSIS — J1282 Pneumonia due to coronavirus disease 2019: Secondary | ICD-10-CM | POA: Diagnosis not present

## 2019-12-13 DIAGNOSIS — E890 Postprocedural hypothyroidism: Secondary | ICD-10-CM | POA: Diagnosis not present

## 2019-12-13 DIAGNOSIS — I2699 Other pulmonary embolism without acute cor pulmonale: Secondary | ICD-10-CM | POA: Diagnosis not present

## 2019-12-13 DIAGNOSIS — J9601 Acute respiratory failure with hypoxia: Secondary | ICD-10-CM | POA: Diagnosis not present

## 2019-12-13 DIAGNOSIS — G4733 Obstructive sleep apnea (adult) (pediatric): Secondary | ICD-10-CM | POA: Diagnosis not present

## 2019-12-13 DIAGNOSIS — G5621 Lesion of ulnar nerve, right upper limb: Secondary | ICD-10-CM | POA: Diagnosis not present

## 2019-12-13 DIAGNOSIS — I10 Essential (primary) hypertension: Secondary | ICD-10-CM | POA: Diagnosis not present

## 2019-12-13 DIAGNOSIS — U071 COVID-19: Secondary | ICD-10-CM | POA: Diagnosis not present

## 2019-12-13 NOTE — Telephone Encounter (Signed)
Patient following up.

## 2019-12-13 NOTE — Telephone Encounter (Signed)
Patient reports burning & soreness in upper back that comes and goes - off to right side, in between spire & shoulder blade. Occurs with rest and activity   Patient reports she will come in for labs on Thurs or Fri and take additional 20mg  lasix (for BID dosing) for 3 days  MD updated

## 2019-12-13 NOTE — Telephone Encounter (Signed)
Left message to call back  

## 2019-12-13 NOTE — Telephone Encounter (Signed)
Patient called back returning Alisha's call  

## 2019-12-13 NOTE — Telephone Encounter (Signed)
Has the swelling improved? She can continue to take an extra 20 mg daily (for 20 mg BID) until swelling improves, but if she has to take this for more than 3 days in a row, we need her to come in to check a BMET. She should also avoid salt, limit fluids to less than 2 L/day, and elevate her feet to see if this helps.

## 2019-12-14 DIAGNOSIS — U071 COVID-19: Secondary | ICD-10-CM | POA: Diagnosis not present

## 2019-12-14 DIAGNOSIS — G4733 Obstructive sleep apnea (adult) (pediatric): Secondary | ICD-10-CM | POA: Diagnosis not present

## 2019-12-14 DIAGNOSIS — I2699 Other pulmonary embolism without acute cor pulmonale: Secondary | ICD-10-CM | POA: Diagnosis not present

## 2019-12-14 DIAGNOSIS — E890 Postprocedural hypothyroidism: Secondary | ICD-10-CM | POA: Diagnosis not present

## 2019-12-14 DIAGNOSIS — G5621 Lesion of ulnar nerve, right upper limb: Secondary | ICD-10-CM | POA: Diagnosis not present

## 2019-12-14 DIAGNOSIS — J1282 Pneumonia due to coronavirus disease 2019: Secondary | ICD-10-CM | POA: Diagnosis not present

## 2019-12-14 DIAGNOSIS — J9601 Acute respiratory failure with hypoxia: Secondary | ICD-10-CM | POA: Diagnosis not present

## 2019-12-14 DIAGNOSIS — I10 Essential (primary) hypertension: Secondary | ICD-10-CM | POA: Diagnosis not present

## 2019-12-15 ENCOUNTER — Ambulatory Visit: Payer: Self-pay | Admitting: Diagnostic Neuroimaging

## 2019-12-15 ENCOUNTER — Telehealth: Payer: Self-pay | Admitting: *Deleted

## 2019-12-15 NOTE — Telephone Encounter (Signed)
Patient was no show for new patient appointment today. 

## 2019-12-16 DIAGNOSIS — Z79899 Other long term (current) drug therapy: Secondary | ICD-10-CM | POA: Diagnosis not present

## 2019-12-16 DIAGNOSIS — R001 Bradycardia, unspecified: Secondary | ICD-10-CM | POA: Diagnosis not present

## 2019-12-16 LAB — BASIC METABOLIC PANEL
BUN/Creatinine Ratio: 22 (ref 12–28)
BUN: 20 mg/dL (ref 8–27)
CO2: 20 mmol/L (ref 20–29)
Calcium: 9.9 mg/dL (ref 8.7–10.3)
Chloride: 103 mmol/L (ref 96–106)
Creatinine, Ser: 0.89 mg/dL (ref 0.57–1.00)
GFR calc Af Amer: 76 mL/min/{1.73_m2} (ref >59)
GFR calc non Af Amer: 66 mL/min/{1.73_m2} (ref >59)
Glucose: 83 mg/dL (ref 65–99)
Potassium: 4.1 mmol/L (ref 3.5–5.2)
Sodium: 141 mmol/L (ref 134–144)

## 2019-12-17 DIAGNOSIS — J9601 Acute respiratory failure with hypoxia: Secondary | ICD-10-CM | POA: Diagnosis not present

## 2019-12-17 DIAGNOSIS — E890 Postprocedural hypothyroidism: Secondary | ICD-10-CM | POA: Diagnosis not present

## 2019-12-17 DIAGNOSIS — G5621 Lesion of ulnar nerve, right upper limb: Secondary | ICD-10-CM | POA: Diagnosis not present

## 2019-12-17 DIAGNOSIS — I2699 Other pulmonary embolism without acute cor pulmonale: Secondary | ICD-10-CM | POA: Diagnosis not present

## 2019-12-17 DIAGNOSIS — J1282 Pneumonia due to coronavirus disease 2019: Secondary | ICD-10-CM | POA: Diagnosis not present

## 2019-12-17 DIAGNOSIS — G4733 Obstructive sleep apnea (adult) (pediatric): Secondary | ICD-10-CM | POA: Diagnosis not present

## 2019-12-17 DIAGNOSIS — I10 Essential (primary) hypertension: Secondary | ICD-10-CM | POA: Diagnosis not present

## 2019-12-17 DIAGNOSIS — U071 COVID-19: Secondary | ICD-10-CM | POA: Diagnosis not present

## 2019-12-20 ENCOUNTER — Encounter: Payer: Self-pay | Admitting: Diagnostic Neuroimaging

## 2019-12-20 DIAGNOSIS — U071 COVID-19: Secondary | ICD-10-CM | POA: Diagnosis not present

## 2019-12-20 DIAGNOSIS — G4733 Obstructive sleep apnea (adult) (pediatric): Secondary | ICD-10-CM | POA: Diagnosis not present

## 2019-12-20 DIAGNOSIS — G5621 Lesion of ulnar nerve, right upper limb: Secondary | ICD-10-CM | POA: Diagnosis not present

## 2019-12-20 DIAGNOSIS — E890 Postprocedural hypothyroidism: Secondary | ICD-10-CM | POA: Diagnosis not present

## 2019-12-20 DIAGNOSIS — I10 Essential (primary) hypertension: Secondary | ICD-10-CM | POA: Diagnosis not present

## 2019-12-20 DIAGNOSIS — J1282 Pneumonia due to coronavirus disease 2019: Secondary | ICD-10-CM | POA: Diagnosis not present

## 2019-12-20 DIAGNOSIS — J9601 Acute respiratory failure with hypoxia: Secondary | ICD-10-CM | POA: Diagnosis not present

## 2019-12-20 DIAGNOSIS — I2699 Other pulmonary embolism without acute cor pulmonale: Secondary | ICD-10-CM | POA: Diagnosis not present

## 2019-12-22 ENCOUNTER — Ambulatory Visit (HOSPITAL_COMMUNITY): Payer: Medicare HMO | Attending: Cardiology

## 2019-12-22 ENCOUNTER — Other Ambulatory Visit: Payer: Self-pay

## 2019-12-22 DIAGNOSIS — I50810 Right heart failure, unspecified: Secondary | ICD-10-CM | POA: Diagnosis not present

## 2019-12-22 DIAGNOSIS — R001 Bradycardia, unspecified: Secondary | ICD-10-CM | POA: Diagnosis not present

## 2019-12-23 DIAGNOSIS — J9601 Acute respiratory failure with hypoxia: Secondary | ICD-10-CM | POA: Diagnosis not present

## 2019-12-23 DIAGNOSIS — I2699 Other pulmonary embolism without acute cor pulmonale: Secondary | ICD-10-CM | POA: Diagnosis not present

## 2019-12-23 DIAGNOSIS — G4733 Obstructive sleep apnea (adult) (pediatric): Secondary | ICD-10-CM | POA: Diagnosis not present

## 2019-12-23 DIAGNOSIS — J1282 Pneumonia due to coronavirus disease 2019: Secondary | ICD-10-CM | POA: Diagnosis not present

## 2019-12-23 DIAGNOSIS — U071 COVID-19: Secondary | ICD-10-CM | POA: Diagnosis not present

## 2019-12-23 DIAGNOSIS — G5621 Lesion of ulnar nerve, right upper limb: Secondary | ICD-10-CM | POA: Diagnosis not present

## 2019-12-23 DIAGNOSIS — E890 Postprocedural hypothyroidism: Secondary | ICD-10-CM | POA: Diagnosis not present

## 2019-12-23 DIAGNOSIS — I10 Essential (primary) hypertension: Secondary | ICD-10-CM | POA: Diagnosis not present

## 2019-12-27 ENCOUNTER — Ambulatory Visit (INDEPENDENT_AMBULATORY_CARE_PROVIDER_SITE_OTHER): Payer: Medicare HMO | Admitting: Internal Medicine

## 2019-12-27 ENCOUNTER — Encounter: Payer: Self-pay | Admitting: Internal Medicine

## 2019-12-27 ENCOUNTER — Other Ambulatory Visit: Payer: Self-pay

## 2019-12-27 ENCOUNTER — Ambulatory Visit (INDEPENDENT_AMBULATORY_CARE_PROVIDER_SITE_OTHER): Payer: Medicare HMO

## 2019-12-27 DIAGNOSIS — I10 Essential (primary) hypertension: Secondary | ICD-10-CM | POA: Diagnosis not present

## 2019-12-27 DIAGNOSIS — R0602 Shortness of breath: Secondary | ICD-10-CM | POA: Diagnosis not present

## 2019-12-27 DIAGNOSIS — R0609 Other forms of dyspnea: Secondary | ICD-10-CM

## 2019-12-27 DIAGNOSIS — U071 COVID-19: Secondary | ICD-10-CM | POA: Diagnosis not present

## 2019-12-27 DIAGNOSIS — R0600 Dyspnea, unspecified: Secondary | ICD-10-CM | POA: Diagnosis not present

## 2019-12-27 DIAGNOSIS — J1282 Pneumonia due to coronavirus disease 2019: Secondary | ICD-10-CM | POA: Diagnosis not present

## 2019-12-27 DIAGNOSIS — G5621 Lesion of ulnar nerve, right upper limb: Secondary | ICD-10-CM | POA: Diagnosis not present

## 2019-12-27 DIAGNOSIS — G4733 Obstructive sleep apnea (adult) (pediatric): Secondary | ICD-10-CM | POA: Diagnosis not present

## 2019-12-27 DIAGNOSIS — I2699 Other pulmonary embolism without acute cor pulmonale: Secondary | ICD-10-CM | POA: Diagnosis not present

## 2019-12-27 DIAGNOSIS — E890 Postprocedural hypothyroidism: Secondary | ICD-10-CM | POA: Diagnosis not present

## 2019-12-27 DIAGNOSIS — J9601 Acute respiratory failure with hypoxia: Secondary | ICD-10-CM | POA: Diagnosis not present

## 2019-12-27 MED ORDER — FAMOTIDINE 20 MG PO TABS: ORAL_TABLET | ORAL | 11 refills | Status: DC

## 2019-12-27 MED ORDER — PANTOPRAZOLE SODIUM 40 MG PO TBEC: 40.0000 mg | DELAYED_RELEASE_TABLET | Freq: Every day | ORAL | 2 refills | Status: DC

## 2019-12-27 NOTE — Progress Notes (Signed)
Spoke with pt and notified of results per Dr. Wert. Pt verbalized understanding and denied any questions. 

## 2019-12-27 NOTE — Assessment & Plan Note (Addendum)
Eval by Gwenette Greet 2009 with findings of obesity/ UACS  - worse than baseline since covid 19  / PE moderate clot burden 08/2019 with neg venous dopplers bilaterally  Echo 12/22/19  1. Left ventricular ejection fraction, by estimation, is 60 to 65%. The  left ventricle has normal function. The left ventricle has no regional  wall motion abnormalities. LV  diastolic parameters are indeterminate.  2. Right ventricular systolic function is normal. The right ventricular  size is not well visualized. Tricuspid regurgitation signal is inadequate  for assessing PA pressure.  3. The mitral valve is grossly normal. Trivial mitral valve  regurgitation. No evidence of mitral stenosis.  4. The aortic valve was not well visualized. Aortic valve regurgitation  is not visualized. No aortic stenosis is present.  5. The inferior vena cava is normal in size with greater than 50%  respiratory variability, suggesting right atrial pressure of 3 mmHg. - 12/27/2019   Walked RA x one lap =  approx 250 ft -@ slow/rollator  pace stopped due to fatigue >> sob with sats 98% RA     Symptoms are  disproportionate to objective findings and not clear to what extent this is actually a pulmonary  problem but pt does appear to have difficult to sort out respiratory symptoms of unknown origin for which  DDX  = almost all start with A and  include Adherence, Ace Inhibitors, Acid Reflux, Active Sinus Disease, Alpha 1 Antitripsin deficiency, Anxiety masquerading as Airways dz,  ABPA,  Allergy(esp in young), Aspiration (esp in elderly), Adverse effects of meds,  Active smoking or Vaping, A bunch of PE's/clot burden (a few small clots can't cause this syndrome unless there is already severe underlying pulm or vascular dz with poor reserve),  Anemia or thyroid disorder, plus two Bs  = Bronchiectasis and Beta blocker use..and one C= CHF   Adherence is always the initial "prime suspect" and is a multilayered concern that requires a "trust but  verify" approach in every patient - starting with knowing how to use medications, especially inhalers, correctly, keeping up with refills and understanding the fundamental difference between maintenance and prns vs those medications only taken for a very short course and then stopped and not refilled.   ? Acid (or non-acid) GERD > always difficult to exclude as up to 75% of pts in some series of pts with cough /wheeze  report no assoc GI/ Heartburn symptoms> rec max (24h)  acid suppression and diet restrictions/ reviewed and instructions given in writing.  ? Adverse drug effects > none of the usual suspects listed   ? Allergy/ asthma > nothing to suggest this at present / no response to saba noted > no w/u for now  ? Anxiety/depression/ deconditioning  > usually at the bottom of this list of usual suspects but   may interfere with adherence and also interpretation of response or lack thereof to symptom management which can be quite subjective.   ? Anemia/ thryoid dz   Lab Results  Component Value Date   HGB 11.7 (L) 09/05/2019   HGB 12.2 09/04/2019   HGB 12.1 09/03/2019     Lab Results  Component Value Date   TSH 0.288 (L) 09/02/2019     ? A bunch of PE's > unlikely on DOAC now   ? CHF > echo encouraging but clearly vol overloaded at present and this does correlate historically with sob > ? Consider adding aldactone next ? > defer to cards    >>>  f/u q 4 weeks with serial walks / pfts if not improving    s

## 2019-12-27 NOTE — Patient Instructions (Addendum)
Pantoprazole (protonix) 40 mg   Take  30-60 min before first meal of the day and Pepcid (famotidine)  20 mg one @  bedtime until return to office - this is the best way to tell whether stomach acid is contributing to your problem.    GERD (REFLUX)  is an extremely common cause of respiratory symptoms just like yours , many times with no obvious heartburn at all.    It can be treated with medication, but also with lifestyle changes including elevation of the head of your bed (ideally with 6 -8inch blocks under the headboard of your bed),  Smoking cessation, avoidance of late meals, excessive alcohol, and avoid fatty foods, chocolate, peppermint, colas, red wine, and acidic juices such as orange juice.  NO MINT OR MENTHOL PRODUCTS SO NO COUGH DROPS  USE SUGARLESS CANDY INSTEAD (Jolley ranchers or Stover's or Life Savers) or even ice chips will also do - the key is to swallow to prevent all throat clearing. NO OIL BASED VITAMINS - use powdered substitutes.  Avoid fish oil when coughing.  Call your cardiologist with your fluid issues  - needs to be much more aggressively managed as may be the cause of your breathing problems if there is a correlation between your severity of  leg swelling and breathing difficulty   Please remember to go to the  x-ray department  for your tests - we will call you with the results when they are available    Please schedule a follow up office visit in 4 weeks, sooner if needed

## 2019-12-27 NOTE — Assessment & Plan Note (Signed)
 >>ASSESSMENT AND PLAN FOR DYSPNEA WRITTEN ON 12/28/2019  8:28 AM BY DARLEAN OZELL NOVAK, MD  Eval by Corrie MAIN with findings of obesity/ UACS  - worse than baseline since covid 19  / PE moderate clot burden 08/2019 with neg venous dopplers bilaterally  Echo 12/22/19  1. Left ventricular ejection fraction, by estimation, is 60 to 65%. The  left ventricle has normal function. The left ventricle has no regional  wall motion abnormalities. LV  diastolic parameters are indeterminate.  2. Right ventricular systolic function is normal. The right ventricular  size is not well visualized. Tricuspid regurgitation signal is inadequate  for assessing PA pressure.  3. The mitral valve is grossly normal. Trivial mitral valve  regurgitation. No evidence of mitral stenosis.  4. The aortic valve was not well visualized. Aortic valve regurgitation  is not visualized. No aortic stenosis is present.  5. The inferior vena cava is normal in size with greater than 50%  respiratory variability, suggesting right atrial pressure of 3 mmHg. - 12/27/2019   Walked RA x one lap =  approx 250 ft -@ slow/rollator  pace stopped due to fatigue >> sob with sats 98% RA     Symptoms are  disproportionate to objective findings and not clear to what extent this is actually a pulmonary  problem but pt does appear to have difficult to sort out respiratory symptoms of unknown origin for which  DDX  = almost all start with A and  include Adherence, Ace Inhibitors, Acid Reflux, Active Sinus Disease, Alpha 1 Antitripsin deficiency, Anxiety masquerading as Airways dz,  ABPA,  Allergy(esp in young), Aspiration (esp in elderly), Adverse effects of meds,  Active smoking or Vaping, A bunch of PE's/clot burden (a few small clots can't cause this syndrome unless there is already severe underlying pulm or vascular dz with poor reserve),  Anemia or thyroid  disorder, plus two Bs  = Bronchiectasis and Beta blocker use..and one C= CHF   Adherence is  always the initial prime suspect and is a multilayered concern that requires a trust but verify approach in every patient - starting with knowing how to use medications, especially inhalers, correctly, keeping up with refills and understanding the fundamental difference between maintenance and prns vs those medications only taken for a very short course and then stopped and not refilled.   ? Acid (or non-acid) GERD > always difficult to exclude as up to 75% of pts in some series of pts with cough /wheeze  report no assoc GI/ Heartburn symptoms> rec max (24h)  acid suppression and diet restrictions/ reviewed and instructions given in writing.  ? Adverse drug effects > none of the usual suspects listed   ? Allergy/ asthma > nothing to suggest this at present / no response to saba noted > no w/u for now  ? Anxiety/depression/ deconditioning  > usually at the bottom of this list of usual suspects but   may interfere with adherence and also interpretation of response or lack thereof to symptom management which can be quite subjective.   ? Anemia/ thryoid dz   Lab Results  Component Value Date   HGB 11.7 (L) 09/05/2019   HGB 12.2 09/04/2019   HGB 12.1 09/03/2019     Lab Results  Component Value Date   TSH 0.288 (L) 09/02/2019     ? A bunch of PE's > unlikely on DOAC now   ? CHF > echo encouraging but clearly vol overloaded at present and this does correlate historically  with sob > ? Consider adding aldactone next ? > defer to cards    >>> f/u q 4 weeks with serial walks / pfts if not improving    s

## 2019-12-27 NOTE — Progress Notes (Signed)
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 Follow outpatient CBC/CMP 2. Continue quarantine until 09/08/19.  Needs 24 hours without symptoms without using fever reducing medications to d/c quarantine.  3. Continue xarelto 15 mg BID until 1/12, on 1/13 will start 20 mg daily - continue indefinitely given hx of VTE - needs at least 3 months uninterrupted, then likely indefinitely 4. Follow repeat echo outpatient for low normal RV systolic function 5. Follow with neurology outpatient, consider MRI and EMG/NCS outpatient 6. Follow blood pressure outpatient.  Holding valstartan-HCTZ.  Follow BP to see if this needs to be resumed.  7. Follow with cardiology outpatient for bradycardia 8. Follow TSH outpatient 9. Follow CXR outpatient 10. Follow pulm nodule outpatient   Discharge Diagnoses:  Principal Problem:   Acute pulmonary embolism (HCC)   HYPOTHYROIDISM, POSTSURGICAL   Hypertension   Acute respiratory failure with hypoxia (HCC)   Obesity, Class III, BMI 40-49.9 (morbid obesity) ([place])   History of present illness:  Patient is 69 year old female with history of hypertension, hypothyroidism, history of left lower extremity DVT for which she was treated with Xarelto, decreased mobility with multiple back problems and spinal disease who presented to the emergency room with fever, cough and shortness of breath for about 2 weeks. She was originally diagnosed with COVID-19 on 08/17/2019 at urgent care and treated empirically with  dexamethasone as outpatient as she was not hypoxic. Patient came to the emergency room with saturation of 80%, tachypneic, afebrile. CT angios showed acute pulmonary embolism with moderate clot burden and bilateral multifocal infiltrates. Started on IV heparin and admitted to [place]. Remains in the hospital waiting to go to SNF.  She was admitted for COVID pneumonia.  She was treated with steroids and remdesivir.  She was also noted to have a submassive PE.  She has been started on xarelto.  She noted RUE weakness and numbness as well as RLE numbness.  She's had a hx of c spine injury.  She had negative head CT x 2 for CVA.  Case was discussed with neurology recommending outpatient follow up.  She's improved and stable for discharge on 1/4.  See below for further details regarding hospitalization.  Hospital Course:  Acute respiratory failure with hypoxia, multifactorial in the setting of COVID-19 pneumonia and pulmonary embolism: Respiratory symptoms improving.Continue chest physiotherapy.  Stable today on RA Finished remdesivir therapy for 5 days.  Finished 10 days of steroid therapy.  Mostly improved. Has some pleuritic pain, improved with tramadol.  Acute pulmonary embolism:With history of thromboembolism. Probably aggravated by acute viral infection, however given second episode of thromboembolism and moderate clot burden, patient will likely benefit with lifelong anticoagulation. Initially treated with heparin infusion and now changed to Xarelto.  Echo with low normal RV systolic function -> follow outpatient LE [place] without VTE  Chest Pain: pt describes CP overnight that improved after she got up and walked. Described as pressure. Some CP with palpation. EKG appears similar to prior, isolated T wave  inversion in lead III. Troponin negative x2. ACS unlikely. Possible musculoskeletal or GI? CXR with improved R base aeration, no acute superimposed process. Will  continue to monitor Trial pepcid - symptoms improved with pepcid, sx likely 2/2 reflux  Right arm and shoulder numbness  RUE weakness: present x ~3 days per patient. Numbness to 4th and 5th finger and palm. She also has weakness in her RUE as well and some numbness to sole of R foot. Hx cervical fusion after accident years ago, though she notes that these symptoms are new. Follow MRI brain and C spine - she declined this as is claustrophobic and only does open MRI's.  CT head and neck unrevealing Repeat CT negative - discussed with neurology - as pt declining MRI here, will need to follow up outpatient. Possibly related to previous hx of cervical spine injury? Will need outpatient follow up, possibly nerve conduction study and EMG.   Hypotension: improved.   Sinus bradycardia: Mostly at sleep. She has sleep apnea. Unable to use CPAP in the hospital. Not on any AV nodal blockers. Seen by cardiology. They recommended outpatient follow-up and ambulatory monitor. Cardiology to schedule a follow-up.  Hypertension:BP starting to rise, restart amlodipine. Follow.  Hypothyroidism:Continue Synthroid. Recheck TSH as outpatient.  Sleep apnea: Obstructive sleep apnea. Will use CPAP after discharge home.  Debility:Patient has significant physical debility and aggravated by current infection. She will benefit with inpatient therapies at a skilled nursing rehab.  Procedures: LE [place] Summary: Right: There is no evidence of deep vein thrombosis in the lower extremity. No cystic structure found in the popliteal fossa. Left: There is no evidence of deep vein thrombosis in the lower extremity. No cystic structure found in the popliteal fossa.  Echo IMPRESSIONS   1. Left ventricular ejection fraction, by visual estimation, is 65 to 70%. The left ventricle has hyperdynamic function. There is no left ventricular hypertrophy. 2. The left ventricle has no regional wall motion  abnormalities. 3. Global right ventricle has low normal systolic function.The right ventricular size is mildly enlarged. No increase in right ventricular wall thickness. 4. Presence of pericardial fat pad. 5. The mitral valve is grossly normal. Trivial mitral valve regurgitation. 6. The tricuspid valve is grossly normal. Tricuspid valve regurgitation is not demonstrated. 7. The tricuspid valve was normal in structure. Tricuspid valve regurgitation is not demonstrated. 8. The aortic valve is grossly normal. Aortic valve regurgitation is not visualized. No evidence of aortic valve sclerosis or stenosis. 9. The inferior vena cava is dilated in size with <50% respiratory variability, suggesting right atrial pressure of 15 mmHg. 10. Technically challenging study. Normal LV function. RV appears to be mildly enlarged, with borderline normal function. Unable to visualize PA well. 11. The interatrial septum was not assessed.   Rehab at [facility] in [place] no 02 but not ambulatory >  walking with cane up and down hallways at [facility] which was about 100 feet s 02 left Jan 10 and able to walk driveway with rollator but 4/22 but only one lap where as 3 days prior was able to do 2 laps     History of Present Illness  12/27/2019  Pulmonary/ 1st office eval/Elizaveta Mattice sob worse than prior to [place] 08/2019 Chief Complaint  Patient presents with  . Pulmonary Consult    Referred by Lazaro Arms, NP.  Pt with hx of covid 19 and PE--on Xarelto. Her breathing had been imrproving but has been worse over the past few days. She states she gets SOB walking  approx 20 ft. She is not using her albuterol inhaler.   Dyspnea:  50 ft  Cough: new dry cough pattern over last week prior to OV  Day > noct  Sleep: on R side/ on cpap x 6 months/ 45 degrees hob SABA use: not using / didn't help CP ever since covid anterior well localized midline worse with coughing   No obvious patterns in  day to day or daytime variability or  assoc excess/ purulent sputum or mucus plugs or hemoptysis or   chest tightness, subjective wheeze or overt sinus or hb symptoms.   Sleeping as above  without nocturnal  or early am exacerbation  of respiratory  c/o's or need for noct saba. Also denies any obvious fluctuation of symptoms with weather or environmental changes or other aggravating or alleviating factors except as outlined above   No unusual exposure hx or h/o childhood pna/ asthma or knowledge of premature birth.  Current Allergies, Complete Past Medical History, Past Surgical History, Family History, and Social History were reviewed in [facility] record.  ROS  The following are not active complaints unless bolded Hoarseness, sore throat, dysphagia, dental problems, itching, sneezing,  nasal congestion or discharge of excess mucus or purulent secretions, ear ache,   fever, chills, sweats, unintended wt loss or wt gain, classically pleuritic or exertional cp,  orthopnea pnd or arm/hand swelling  or leg swelling, presyncope, palpitations, abdominal pain, anorexia, nausea, vomiting, diarrhea  or change in bowel habits or change in bladder habits, change in stools or change in urine, dysuria, hematuria,  rash, arthralgias, visual complaints, headache, numbness, weakness or ataxia or problems with walking or coordination,  change in mood or  memory.              Past Medical History:  Diagnosis Date  . 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 Complication of anesthesia    trouble with airway  . Edema 12/22/2008  . HYPERTENSION 12/25/2007  . HYPOTHYROIDISM, POSTSURGICAL 05/04/2008  . LEG PAIN, LEFT 12/09/2008  . OSA (obstructive sleep apnea) 06/15/2013    Outpatient Medications Prior to Visit  Medication Sig Dispense Refill  . amLODipine (NORVASC) 5 MG  tablet Take 5 mg by mouth at bedtime.    Marland Kitchen Dextromethorphan-Guaifenesin (MUCINEX DM MAXIMUM STRENGTH) 60-1200 MG TB12 Take 1 tablet by mouth every 12 (twelve) hours. 14 each 0  . furosemide (LASIX) 20 MG tablet Start by taking 1 tablet every other day. (Patient taking differently: Take by mouth daily. 2 x daily 3 days per wk then off 3 days) 7 tablet 0  . levothyroxine (SYNTHROID) 150 MCG tablet Take 150 mcg by mouth daily.    . Multiple Vitamin (MULTIVITAMIN WITH MINERALS) TABS tablet Take 1 tablet by mouth daily.    Marland Kitchen MYRBETRIQ 25 MG TB24 tablet Take 25 mg by mouth daily.  4  . rivaroxaban (XARELTO) 20 MG TABS tablet Take 1 tablet (20 mg total) by mouth daily with supper. 30 tablet 0  . albuterol (VENTOLIN HFA) 108 (90 Base) MCG/ACT inhaler Inhale 1-2 puffs into the lungs every 6 (six) hours as needed for wheezing or shortness of breath. (Patient not taking: Reported on 12/27/2019) 8 g 0  . fexofenadine (ALLEGRA) 180 MG tablet Take 180 mg by mouth daily.    . famotidine (PEPCID) 20  MG tablet Take 1 tablet (20 mg total) by mouth 2 (two) times daily. 60 tablet 0     Objective:     BP 132/82 (BP Location: Left Wrist, Cuff Size: Large)   Pulse 89   Temp (!) 97 F (36.1 C) (Temporal)   Ht 5\' 7"  (1.702 m)   Wt (!) 313 lb 6.4 oz (142.2 kg)   SpO2 96% Comment: on RA  BMI 49.09 kg/m   SpO2: 96 %(on RA)   Wt Readings from Last 3 Encounters:  12/27/19 (!) 313 lb 6.4 oz (142.2 kg)  11/17/19 (!) 311 lb (141.1 kg)  11/08/19 (!) 319 lb (144.7 kg)        amb obese bf with dry cough early on insp maneuvers    HEENT : pt wearing mask not removed for exam due to covid -19 concerns.    NECK :  without JVD/Nodes/TM/ nl carotid upstrokes bilaterally   LUNGS: no acc muscle use,  Nl contour chest which is clear to A and P bilaterally   CV:  RRR  no s3 or murmur or increase in P2, and  1-2+ pitting both LE's  ABD: Quite obese/  soft and nontender with nl inspiratory excursion in the supine  position. No bruits or organomegaly appreciated, bowel sounds nl  MS:  Nl gait/ ext warm without deformities, calf tenderness, cyanosis or clubbing No obvious joint restrictions   SKIN: warm and dry without lesions    NEURO:  alert, approp, nl sensorium with  no motor or cerebellar deficits apparent.     CXR PA and Lateral:   12/27/2019 :    I personally reviewed images and agree with radiology impression as follows:    No active cardiopulmonary disease.     Assessment   DOE (dyspnea on exertion) Eval by Clance 2009 with findings of obesity/ UACS  - worse than baseline since covid 19  / PE moderate clot burden 08/2019 with neg venous dopplers bilaterally  Echo 12/22/19  1. Left ventricular ejection fraction, by estimation, is 60 to 65%. The  left ventricle has normal function. The left ventricle has no regional  wall motion abnormalities. LV  diastolic parameters are indeterminate.  2. Right ventricular systolic function is normal. The right ventricular  size is not well visualized. Tricuspid regurgitation signal is inadequate  for assessing PA pressure.  3. The mitral valve is grossly normal. Trivial mitral valve  regurgitation. No evidence of mitral stenosis.  4. The aortic valve was not well visualized. Aortic valve regurgitation  is not visualized. No aortic stenosis is present.  5. The inferior vena cava is normal in size with greater than 50%  respiratory variability, suggesting right atrial pressure of 3 mmHg. - 12/27/2019   Walked RA x one lap =  approx 250 ft -@ slow/rollator  pace stopped due to fatigue >> sob with sats 98% RA     Symptoms are  disproportionate to objective findings and not clear to what extent this is actually a pulmonary  problem but pt does appear to have difficult to sort out respiratory symptoms of unknown origin for which  DDX  = almost all start with A and  include Adherence, Ace Inhibitors, Acid Reflux, Active Sinus Disease, Alpha 1 Antitripsin  deficiency, Anxiety masquerading as Airways dz,  ABPA,  Allergy(esp in young), Aspiration (esp in elderly), Adverse effects of meds,  Active smoking or Vaping, A bunch of PE's/clot burden (a few small clots can't cause this syndrome unless there  is already severe underlying pulm or vascular dz with poor reserve),  Anemia or thyroid disorder, plus two Bs  = Bronchiectasis and Beta blocker use..and one C= CHF   Adherence is always the initial "prime suspect" and is a multilayered concern that requires a "trust but verify" approach in every patient - starting with knowing how to use medications, especially inhalers, correctly, keeping up with refills and understanding the fundamental difference between maintenance and prns vs those medications only taken for a very short course and then stopped and not refilled.   ? Acid (or non-acid) GERD > always difficult to exclude as up to 75% of pts in some series of pts with cough /wheeze  report no assoc GI/ Heartburn symptoms> rec max (24h)  acid suppression and diet restrictions/ reviewed and instructions given in writing.  ? Adverse drug effects > none of the usual suspects listed   ? Allergy/ asthma > nothing to suggest this at present / no response to saba noted > no w/u for now  ? Anxiety/depression/ deconditioning  > usually at the bottom of this list of usual suspects but   may interfere with adherence and also interpretation of response or lack thereof to symptom management which can be quite subjective.   ? Anemia/ thryoid dz   Lab Results  Component Value Date   HGB 11.7 (L) 09/05/2019   HGB 12.2 09/04/2019   HGB 12.1 09/03/2019     Lab Results  Component Value Date   TSH 0.288 (L) 09/02/2019     ? A bunch of PE's > unlikely on DOAC now   ? CHF > echo encouraging but clearly vol overloaded at present and this does correlate historically with sob > ? Consider adding aldactone next ? > defer to cards    >>> f/u q 4 weeks with serial walks  / pfts if not improving        Obesity, Class III, BMI 40-49.9 (morbid obesity) (HCC) Body mass index is 49.09 kg/m.  -  Lab Results  Component Value Date   TSH 0.288 (L) 09/02/2019     Contributing to gerd risk/ doe/reviewed the need and the process to achieve and maintain neg calorie balance > defer f/u primary care including intermittently monitoring thyroid status            Each maintenance medication was reviewed in detail including emphasizing most importantly the difference between maintenance and prns and under what circumstances the prns are to be triggered using an action plan format where appropriate.  Total time for H and P, chart review, counseling,  directly observing portions of ambulatory 02 saturation study/  and generating customized AVS unique to this office visit / charting = 60 min           Christinia Gully, MD 12/27/2019

## 2019-12-28 ENCOUNTER — Encounter: Payer: Self-pay | Admitting: Internal Medicine

## 2019-12-28 NOTE — Assessment & Plan Note (Signed)
Body mass index is 49.09 kg/m.  -  Lab Results  Component Value Date   TSH 0.288 (L) 09/02/2019     Contributing to gerd risk/ doe/reviewed the need and the process to achieve and maintain neg calorie balance > defer f/u primary care including intermittently monitoring thyroid status            Each maintenance medication was reviewed in detail including emphasizing most importantly the difference between maintenance and prns and under what circumstances the prns are to be triggered using an action plan format where appropriate.  Total time for H and P, chart review, counseling,  directly observing portions of ambulatory 02 saturation study/  and generating customized AVS unique to this office visit / charting = 60 min

## 2019-12-29 DIAGNOSIS — G4733 Obstructive sleep apnea (adult) (pediatric): Secondary | ICD-10-CM | POA: Diagnosis not present

## 2019-12-29 DIAGNOSIS — I2699 Other pulmonary embolism without acute cor pulmonale: Secondary | ICD-10-CM | POA: Diagnosis not present

## 2019-12-29 DIAGNOSIS — J9601 Acute respiratory failure with hypoxia: Secondary | ICD-10-CM | POA: Diagnosis not present

## 2019-12-29 DIAGNOSIS — E890 Postprocedural hypothyroidism: Secondary | ICD-10-CM | POA: Diagnosis not present

## 2019-12-29 DIAGNOSIS — I10 Essential (primary) hypertension: Secondary | ICD-10-CM | POA: Diagnosis not present

## 2019-12-29 DIAGNOSIS — U071 COVID-19: Secondary | ICD-10-CM | POA: Diagnosis not present

## 2019-12-29 DIAGNOSIS — G5621 Lesion of ulnar nerve, right upper limb: Secondary | ICD-10-CM | POA: Diagnosis not present

## 2019-12-29 DIAGNOSIS — J1282 Pneumonia due to coronavirus disease 2019: Secondary | ICD-10-CM | POA: Diagnosis not present

## 2019-12-31 ENCOUNTER — Ambulatory Visit (INDEPENDENT_AMBULATORY_CARE_PROVIDER_SITE_OTHER): Payer: Medicare HMO | Admitting: Cardiology

## 2019-12-31 ENCOUNTER — Other Ambulatory Visit: Payer: Self-pay

## 2019-12-31 ENCOUNTER — Encounter: Payer: Self-pay | Admitting: Cardiology

## 2019-12-31 VITALS — BP 124/74 | HR 74 | Temp 97.0°F | Ht 67.0 in | Wt 315.0 lb

## 2019-12-31 DIAGNOSIS — Z712 Person consulting for explanation of examination or test findings: Secondary | ICD-10-CM

## 2019-12-31 DIAGNOSIS — Z86711 Personal history of pulmonary embolism: Secondary | ICD-10-CM | POA: Diagnosis not present

## 2019-12-31 DIAGNOSIS — R600 Localized edema: Secondary | ICD-10-CM

## 2019-12-31 DIAGNOSIS — Z8616 Personal history of COVID-19: Secondary | ICD-10-CM

## 2019-12-31 DIAGNOSIS — I471 Supraventricular tachycardia: Secondary | ICD-10-CM | POA: Diagnosis not present

## 2019-12-31 DIAGNOSIS — I10 Essential (primary) hypertension: Secondary | ICD-10-CM | POA: Diagnosis not present

## 2019-12-31 MED ORDER — FUROSEMIDE 20 MG PO TABS: 20.0000 mg | ORAL_TABLET | Freq: Two times a day (BID) | ORAL | 11 refills | Status: DC

## 2019-12-31 NOTE — Progress Notes (Signed)
Cardiology Office Note:    Date:  12/31/2019   ID:  [name], DOB [birth year], MRN [id number]  PCP:  Lucianne Lei, MD  Cardiologist:  Buford Dresser, MD  Referring MD: Lucianne Lei, MD   CC: follow up  History of Present Illness:    Natasha [name] is a 69 y.o. female with a hx of Covid-19 infection in 08/2019, pulmonary embolism, hypertension, hyperthyroidism who is seen for follow up. I initially saw her 11/17/19 as a new consult at the request of Lucianne Lei, MD for the evaluation and management of post-Covid/post-PE management and bradycardia.  Today: Saw Dr. Melvyn Novas several days ago, wondering if diuretic needs increased. Cough improving with PPI for reflux. Taking 20 mg BID for 3 days, then has a day of one 20 mg pill, then back to BID.  Working with therapy at home, walks the driveway three times, which is a big improvement for her.   Weight up to 315 lbs, was 311 lbs at last visit. Taking furosemide currently every other day. Eating well, trying to be active. Eating Factor 75 meals, 400-500 mg/sodium per meal. No carbs, eating lots of fruits/vegetables. Doing Noom as well.  Has bladder issues/incontinence still, has improved slightly on myrbetriq.  Reviewed echo and monitor results today. Echo showed RAP 3, no significant TR.  Has compression stockings at home, recommended using these. Also discussed elevation.   Denies chest pain, shortness of breath at rest. No PND, orthopnea. No syncope or palpitations.  Past Medical History:  Diagnosis Date  . 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 Complication of anesthesia    trouble with airway  . Edema 12/22/2008  . HYPERTENSION 12/25/2007  . HYPOTHYROIDISM, POSTSURGICAL 05/04/2008  . LEG PAIN, LEFT 12/09/2008  . OSA (obstructive sleep apnea) 06/15/2013    Past  Surgical History:  Procedure Laterality Date  . ABDOMINAL HYSTERECTOMY    . ANKLE SURGERY    . CERVICAL FUSION     x 2  . COLONOSCOPY WITH PROPOFOL N/A 08/08/2015   Procedure: COLONOSCOPY WITH PROPOFOL;  Surgeon: Juanita Craver, MD;  Location: [facility];  Service: Endoscopy;  Laterality: N/A;  . KNEE SURGERY    . TOTAL THYROIDECTOMY      Current Medications: Current Outpatient Medications on File Prior to Visit  Medication Sig  . albuterol (VENTOLIN HFA) 108 (90 Base) MCG/ACT inhaler Inhale 1-2 puffs into the lungs every 6 (six) hours as needed for wheezing or shortness of breath.  Marland Kitchen amLODipine (NORVASC) 5 MG tablet Take 5 mg by mouth at bedtime.  Marland Kitchen Dextromethorphan-Guaifenesin (MUCINEX DM MAXIMUM STRENGTH) 60-1200 MG TB12 Take 1 tablet by mouth every 12 (twelve) hours.  . famotidine (PEPCID) 20 MG tablet One after supper  . fexofenadine (ALLEGRA) 180 MG tablet Take 180 mg by mouth daily.  . furosemide (LASIX) 20 MG tablet Start by taking 1 tablet every other day. (Patient taking differently: Take by mouth daily. 2 x daily 3 days per wk then off 3 days)  . levothyroxine (SYNTHROID) 150 MCG tablet Take 150 mcg by mouth daily.  . Multiple Vitamin (MULTIVITAMIN WITH MINERALS) TABS tablet Take 1 tablet by mouth daily.  Marland Kitchen MYRBETRIQ 25 MG TB24 tablet Take 25 mg by mouth daily.  . pantoprazole (PROTONIX) 40 MG tablet Take 1 tablet (40 mg total) by mouth daily.  Take 30-60 min before first meal of the day  . rivaroxaban (XARELTO) 20 MG TABS tablet Take 1 tablet (20 mg total) by mouth daily with supper.   No current facility-administered medications on file prior to visit.     Allergies:   Codeine and Eggs or egg-derived products   Social History   Tobacco Use  . Smoking status: Never Smoker  . Smokeless tobacco: Never Used  Substance Use Topics  . Alcohol use: Yes    Comment: 5 drinks/year  . Drug use: No    Family History: family history includes Heart disease in her mother; Kidney  disease in her mother.  ROS:   Please see the history of present illness.  Additional pertinent ROS otherwise unremarkable.  EKGs/Labs/Other Studies Reviewed:    The following studies were reviewed today: Echo 12/22/19 1. Left ventricular ejection fraction, by estimation, is 60 to 65%. The  left ventricle has normal function. The left ventricle has no regional  wall motion abnormalities. Left ventricular diastolic parameters are  indeterminate.  2. Right ventricular systolic function is normal. The right ventricular  size is not well visualized. Tricuspid regurgitation signal is inadequate  for assessing PA pressure.  3. The mitral valve is grossly normal. Trivial mitral valve  regurgitation. No evidence of mitral stenosis.  4. The aortic valve was not well visualized. Aortic valve regurgitation  is not visualized. No aortic stenosis is present.  5. The inferior vena cava is normal in size with greater than 50%  respiratory variability, suggesting right atrial pressure of 3 mmHg.   Monitor 12/16/19 ~7 days of data recorded on Zio monitor. Patient had a min HR of 44 bpm, max HR of 174 bpm, and avg HR of 67 bpm. Predominant underlying rhythm was Sinus Rhythm. No VT, atrial fibrillation, high degree block, or pauses noted. Isolated atrial and ventricular ectopy was rare (<1%). There were 5 triggered events. These were sinus rhythm with/without PVC. There were 10 SVT events, the fastest interval lasting 7 beats with a max rate of 174 bpm, the longest 21 beats/9.3 secs with an avg rate of 131 bpm.   Echo 08/22/19 1. Left ventricular ejection fraction, by visual estimation, is 65 to  70%. The left ventricle has hyperdynamic function. There is no left  ventricular hypertrophy.  2. The left ventricle has no regional wall motion abnormalities.  3. Global right ventricle has low normal systolic function.The right  ventricular size is mildly enlarged. No increase in right ventricular wall    thickness.  4. Presence of pericardial fat pad.  5. The mitral valve is grossly normal. Trivial mitral valve  regurgitation.  6. The tricuspid valve is grossly normal. Tricuspid valve regurgitation  is not demonstrated.  7. The tricuspid valve was normal in structure. Tricuspid valve  regurgitation is not demonstrated.  8. The aortic valve is grossly normal. Aortic valve regurgitation is not  visualized. No evidence of aortic valve sclerosis or stenosis.  9. The inferior vena cava is dilated in size with <50% respiratory  variability, suggesting right atrial pressure of 15 mmHg.  10. Technically challenging study. Normal LV function. RV appears to be  mildly enlarged, with borderline normal function. Unable to visualize PA  well.  11. The interatrial septum was not assessed.   EKG:  EKG is personally reviewed.  The ekg ordered 11/17/19 demonstrates NSR  Recent Labs: 09/02/2019: TSH 0.288 09/03/2019: B Natriuretic Peptide 30.7 09/05/2019: ALT 16; Hemoglobin 11.7; Magnesium 2.2; Platelets 232 12/16/2019: BUN 20; Creatinine,  Ser 0.89; Potassium 4.1; Sodium 141  Recent Lipid Panel    Component Value Date/Time   CHOL 174 09/04/2019 1230   TRIG 89 09/04/2019 1230   HDL 76 09/04/2019 1230   CHOLHDL 2.3 09/04/2019 1230   VLDL 18 09/04/2019 1230   LDLCALC 80 09/04/2019 1230    Physical Exam:    VS:  BP 124/74   Pulse 74   Temp (!) 97 F (36.1 C)   Ht 5\' 7"  (1.702 m)   Wt (!) 315 lb (142.9 kg)   SpO2 94%   BMI 49.34 kg/m     Wt Readings from Last 3 Encounters:  12/31/19 (!) 315 lb (142.9 kg)  12/27/19 (!) 313 lb 6.4 oz (142.2 kg)  11/17/19 (!) 311 lb (141.1 kg)    GEN: Well nourished, well developed in no acute distress HEENT: Normal, moist mucous membranes NECK: No JVD CARDIAC: regular rhythm, normal S1 and S2, no rubs or gallops. No murmur. VASCULAR: Radial and DP pulses 2+ bilaterally. No carotid bruits RESPIRATORY:  Clear to auscultation without rales, wheezing or  rhonchi  ABDOMEN: Soft, non-tender, non-distended MUSCULOSKELETAL:  Ambulates independently SKIN: Warm and dry, 2+ bilateral LE edema to thighs NEUROLOGIC:  Alert and oriented x 3. No focal neuro deficits noted. PSYCHIATRIC:  Normal affect   ASSESSMENT:    1. Bilateral leg edema   2. Essential hypertension   3. History of 2019 novel coronavirus disease (COVID-19)   4. Encounter to discuss test results   5. History of pulmonary embolism   6. Paroxysmal SVT (supraventricular tachycardia) (HCC)    PLAN:    Lower extremity edema: -on echo, right atrial pressure normal, not enough TR to estimate RVSP. Suggests this is unlikely all cardiac, though with recent covid and prior PE, would not be uncommon to have right sided elevated pressures -will use Lasix bid, follow up 3-4 weeks.  -bmet today -counseled on salt avoidance, daily weights, fluid guidelines  History of Covid pneumonia and pulmonary embolism, with evidence of right sided heart failure: -reviewed echocardiogram results -counseled on need to continue using CPAP for OSA -counseled on daily weights, salt avoidance -counseled on compression stockings and leg elevation -remains on rivaroxaban, likely lifelong based on notes  Hypertension: well controlled today -continued amlodipine. Do not suspect that 5 mg dose is contributing significantly to LE edema, but if no other etiology found may want to trial a different medication  Bradycardia: in the setting of respiratory illness and OSA -monitor did not show high grade conduction disease or pauses, but did show brief episodes of paroxysmal SVT. These were not linked to symptoms.  Cardiac risk counseling and prevention recommendations: -recommend heart healthy/Mediterranean diet, with whole grains, fruits, vegetable, fish, lean meats, nuts, and olive oil. Limit salt. -recommend moderate walking, 3-5 times/week for 30-50 minutes each session. Aim for at least 150 minutes.week. Goal  should be pace of 3 miles/hours, or walking 1.5 miles in 30 minutes. She is very deconditioned and limited in her ability to exercise currently. -recommend avoidance of tobacco products. Avoid excess alcohol. -ASCVD risk score: The 10-year ASCVD risk score Mikey Bussing DC Brooke Bonito., et al., 2013) is: 21.3%   Values used to calculate the score:     Age: 80 years     Sex: Female     Is Non-Hispanic African American: Yes     Diabetic: Yes     Tobacco smoker: No     Systolic Blood Pressure: [id number] mmHg     Is BP treated:  Yes     HDL Cholesterol: 76 mg/dL     Total Cholesterol: 174 mg/dL    Plan for follow up: 3-4 weeks  Buford Dresser, MD, PhD [facility]   She has requested referral to a PCP in the [facility] EMR; this referral was made today.  Medication Adjustments/Labs and Tests Ordered: Current medicines are reviewed at length with the patient today.  Concerns regarding medicines are outlined above.  No orders of the defined types were placed in this encounter.  Meds ordered this encounter  Medications  . furosemide (LASIX) 20 MG tablet    Sig: Take 1 tablet (20 mg total) by mouth 2 (two) times daily.    Dispense:  60 tablet    Refill:  11    Patient Instructions  Medication Instructions:  INCREASE- Furosemide(Lasix) 20 mg by mouth twice a day  *If you need a refill on your cardiac medications before your next appointment, please call your pharmacy*   Lab Work: None ordered  Testing/Procedures: None Ordered   Follow-Up: At [facility], you and your health needs are our priority.  As part of our continuing mission to provide you with exceptional heart care, we have created designated Provider Care Teams.  These Care Teams include your primary Cardiologist (physician) and Advanced Practice Providers (APPs -  Physician Assistants and Nurse Practitioners) who all work together to provide you with the care you need, when you need it.  We recommend signing up for  the patient portal called "MyChart".  Sign up information is provided on this After Visit Summary.  MyChart is used to connect with patients for Virtual Visits (Telemedicine).  Patients are able to view lab/test results, encounter notes, upcoming appointments, etc.  Non-urgent messages can be sent to your provider as well.   To learn more about what you can do with MyChart, go to [email or web address].    Your next appointment:   3-4  week(s)  The format for your next appointment:   In Person  Provider:   Buford Dresser, MD      Signed, Buford Dresser, MD PhD 12/31/2019  [place]

## 2019-12-31 NOTE — Patient Instructions (Signed)
Medication Instructions:  INCREASE- Furosemide(Lasix) 20 mg by mouth twice a day  *If you need a refill on your cardiac medications before your next appointment, please call your pharmacy*   Lab Work: None ordered  Testing/Procedures: None Ordered   Follow-Up: At [facility], you and your health needs are our priority.  As part of our continuing mission to provide you with exceptional heart care, we have created designated Provider Care Teams.  These Care Teams include your primary Cardiologist (physician) and Advanced Practice Providers (APPs -  Physician Assistants and Nurse Practitioners) who all work together to provide you with the care you need, when you need it.  We recommend signing up for the patient portal called "MyChart".  Sign up information is provided on this After Visit Summary.  MyChart is used to connect with patients for Virtual Visits (Telemedicine).  Patients are able to view lab/test results, encounter notes, upcoming appointments, etc.  Non-urgent messages can be sent to your provider as well.   To learn more about what you can do with MyChart, go to [email or web address].    Your next appointment:   3-4  week(s)  The format for your next appointment:   In Person  Provider:   Buford Dresser, MD

## 2020-01-03 DIAGNOSIS — U071 COVID-19: Secondary | ICD-10-CM | POA: Diagnosis not present

## 2020-01-03 DIAGNOSIS — J1282 Pneumonia due to coronavirus disease 2019: Secondary | ICD-10-CM | POA: Diagnosis not present

## 2020-01-03 DIAGNOSIS — I10 Essential (primary) hypertension: Secondary | ICD-10-CM | POA: Diagnosis not present

## 2020-01-03 DIAGNOSIS — E890 Postprocedural hypothyroidism: Secondary | ICD-10-CM | POA: Diagnosis not present

## 2020-01-03 DIAGNOSIS — I2699 Other pulmonary embolism without acute cor pulmonale: Secondary | ICD-10-CM | POA: Diagnosis not present

## 2020-01-03 DIAGNOSIS — G4733 Obstructive sleep apnea (adult) (pediatric): Secondary | ICD-10-CM | POA: Diagnosis not present

## 2020-01-03 DIAGNOSIS — J9601 Acute respiratory failure with hypoxia: Secondary | ICD-10-CM | POA: Diagnosis not present

## 2020-01-03 DIAGNOSIS — G5621 Lesion of ulnar nerve, right upper limb: Secondary | ICD-10-CM | POA: Diagnosis not present

## 2020-01-04 ENCOUNTER — Ambulatory Visit (INDEPENDENT_AMBULATORY_CARE_PROVIDER_SITE_OTHER): Payer: Medicare HMO | Admitting: Neurology

## 2020-01-04 ENCOUNTER — Other Ambulatory Visit: Payer: Self-pay

## 2020-01-04 ENCOUNTER — Encounter: Payer: Self-pay | Admitting: Neurology

## 2020-01-04 VITALS — BP 123/79 | HR 73 | Temp 96.8°F | Ht 67.0 in | Wt 313.5 lb

## 2020-01-04 DIAGNOSIS — R269 Unspecified abnormalities of gait and mobility: Secondary | ICD-10-CM | POA: Diagnosis not present

## 2020-01-04 DIAGNOSIS — R202 Paresthesia of skin: Secondary | ICD-10-CM | POA: Diagnosis not present

## 2020-01-04 DIAGNOSIS — R531 Weakness: Secondary | ICD-10-CM

## 2020-01-04 DIAGNOSIS — Z7409 Other reduced mobility: Secondary | ICD-10-CM | POA: Insufficient documentation

## 2020-01-04 HISTORY — DX: Paresthesia of skin: R20.2

## 2020-01-04 HISTORY — DX: Weakness: R53.1
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
## 2020-01-05 DIAGNOSIS — I10 Essential (primary) hypertension: Secondary | ICD-10-CM | POA: Diagnosis not present

## 2020-01-05 DIAGNOSIS — G4733 Obstructive sleep apnea (adult) (pediatric): Secondary | ICD-10-CM | POA: Diagnosis not present

## 2020-01-05 DIAGNOSIS — E890 Postprocedural hypothyroidism: Secondary | ICD-10-CM | POA: Diagnosis not present

## 2020-01-05 DIAGNOSIS — J9601 Acute respiratory failure with hypoxia: Secondary | ICD-10-CM | POA: Diagnosis not present

## 2020-01-05 DIAGNOSIS — G5621 Lesion of ulnar nerve, right upper limb: Secondary | ICD-10-CM | POA: Diagnosis not present

## 2020-01-05 DIAGNOSIS — U071 COVID-19: Secondary | ICD-10-CM | POA: Diagnosis not present

## 2020-01-05 DIAGNOSIS — J1282 Pneumonia due to coronavirus disease 2019: Secondary | ICD-10-CM | POA: Diagnosis not present

## 2020-01-05 DIAGNOSIS — I2699 Other pulmonary embolism without acute cor pulmonale: Secondary | ICD-10-CM | POA: Diagnosis not present

## 2020-01-06 ENCOUNTER — Telehealth: Payer: Self-pay | Admitting: Neurology

## 2020-01-06 NOTE — Telephone Encounter (Signed)
[employer] pending uploaded notes.  [employer] Auth: [id number] (exp. 01/06/20 to 07/03/20)

## 2020-01-07 DIAGNOSIS — U071 COVID-19: Secondary | ICD-10-CM | POA: Diagnosis not present

## 2020-01-07 DIAGNOSIS — E890 Postprocedural hypothyroidism: Secondary | ICD-10-CM | POA: Diagnosis not present

## 2020-01-07 DIAGNOSIS — G5621 Lesion of ulnar nerve, right upper limb: Secondary | ICD-10-CM | POA: Diagnosis not present

## 2020-01-07 DIAGNOSIS — J1282 Pneumonia due to coronavirus disease 2019: Secondary | ICD-10-CM | POA: Diagnosis not present

## 2020-01-07 DIAGNOSIS — G4733 Obstructive sleep apnea (adult) (pediatric): Secondary | ICD-10-CM | POA: Diagnosis not present

## 2020-01-07 DIAGNOSIS — I2699 Other pulmonary embolism without acute cor pulmonale: Secondary | ICD-10-CM | POA: Diagnosis not present

## 2020-01-07 DIAGNOSIS — J9601 Acute respiratory failure with hypoxia: Secondary | ICD-10-CM | POA: Diagnosis not present

## 2020-01-07 DIAGNOSIS — I10 Essential (primary) hypertension: Secondary | ICD-10-CM | POA: Diagnosis not present

## 2020-01-10 ENCOUNTER — Ambulatory Visit: Payer: Medicare HMO

## 2020-01-10 NOTE — Telephone Encounter (Signed)
Mcarthur Rossetti Josem Kaufmann: [id number] (exp. 01/06/20 to 02/05/20) order faxed to [facility].  Imaging they will reach out to the patient to schedule.

## 2020-01-11 ENCOUNTER — Encounter: Payer: Self-pay | Admitting: Counselor

## 2020-01-11 ENCOUNTER — Ambulatory Visit (INDEPENDENT_AMBULATORY_CARE_PROVIDER_SITE_OTHER): Payer: Medicare HMO | Admitting: Nurse Practitioner

## 2020-01-11 ENCOUNTER — Other Ambulatory Visit: Payer: Self-pay

## 2020-01-11 VITALS — BP 110/70 | HR 75 | Temp 97.0°F | Ht 67.0 in | Wt 315.5 lb

## 2020-01-11 DIAGNOSIS — Z8616 Personal history of COVID-19: Secondary | ICD-10-CM

## 2020-01-11 DIAGNOSIS — F488 Other specified nonpsychotic mental disorders: Secondary | ICD-10-CM | POA: Diagnosis not present

## 2020-01-11 DIAGNOSIS — R413 Other amnesia: Secondary | ICD-10-CM

## 2020-01-11 DIAGNOSIS — R4189 Other symptoms and signs involving cognitive functions and awareness: Secondary | ICD-10-CM

## 2020-01-11 MED ORDER — LEVOTHYROXINE SODIUM 150 MCG PO TABS: 150.0000 ug | ORAL_TABLET | Freq: Every day | ORAL | 0 refills | Status: DC

## 2020-01-11 MED ORDER — AMLODIPINE BESYLATE 5 MG PO TABS: 5.0000 mg | ORAL_TABLET | Freq: Every day | ORAL | 0 refills | Status: DC

## 2020-01-11 NOTE — Progress Notes (Signed)
@Patient  ID: Natasha [name], female    DOB: October 28, 1950, 69 y.o.   MRN: [id number]  Chief Complaint  Patient presents with  . Chills  . Generalized Body Aches  . swelling in legs and feet  . Fatigue    Referring provider: Lucianne Lei, MD   69 year old female with history of hypertension, hypothyroidism, left lower DVT, spinal disease, Diagnosed with Covid on 08/17/19.   Recent significant Encounters:   Hospital admission 08/21/19 - 09/06/19: Patient was admitted for COVID pneumonia. She was treated with steroids and remdesivir. She was also noted to have a submassive PE. She was started on xarelto. She noted RUE weakness and numbness as well as RLE numbness. She's had a hx of c spine injury. She had negative head CT x 2 for CVA. Case was discussed with neurology recommending outpatient follow up. Patient was discharge to SNF for rehab.   12/27/19 Pulmonary: Started on reflux medication for cough, chest x ray improved, will follow up in 4 weeks for PFT and walk.   12/31/19 Cardiology: 7 day monitor, scheduled for repeat echo, encouraged daily weights and low sodium diet, continued on anticoagulant.   01/04/20 neurology: MRI ordered, follow up in 4 months   HPI   Patient presents today for post Covid care clinic follow-up visit.  Patient states that she has much improved since her last visit here.  She has been working with physical therapy at home.  She has followed up with pulmonary and is scheduled for a follow-up appointment in 4 weeks for a PFT and walk.  She was started on reflux medication for cough and states that this has helped.  Her chest x-ray did look improved at that visit.  Patient has followed up with neurology and is scheduled to have an MRI done we will follow-up in 4 months.  She states that the numbness and tingling in her hands and feet are improved.  She does still complain of ongoing memory loss and brain fog.  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  Patient was also seen by cardiology and is scheduled for a repeat echo.  She was advised to keep daily weights and continue low-sodium diet and anticoagulation.  Overall patient continues to improve.  Patient states that she would like to start a support group for people in the community have had Covid.Denies f/c/s, n/v/d, hemoptysis, PND, chest pain or edema.         Allergies  Allergen Reactions  . Codeine Shortness Of Breath and Palpitations  . Eggs Or Egg-Derived Products Nausea And Vomiting    PROJECTILE VOMITING    Immunization History  Administered Date(s) Administered  . PFIZER SARS-COV-2 Vaccination 11/14/2019, 12/06/2019    Past Medical History:  Diagnosis Date  . 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 Complication of anesthesia    trouble with airway  . Edema 12/22/2008  . History of COVID-19 08/2019  . HYPERTENSION 12/25/2007  . HYPOTHYROIDISM, POSTSURGICAL 05/04/2008  . LEG PAIN, LEFT 12/09/2008  . Muscle weakness   . Numbness and tingling   . OSA (obstructive sleep apnea) 06/15/2013    Tobacco History: Social History   Tobacco Use  Smoking Status Never Smoker  Smokeless Tobacco Never Used   Counseling given: Yes   Outpatient  Encounter Medications as of 01/11/2020  Medication Sig  . albuterol (VENTOLIN HFA) 108 (90 Base) MCG/ACT inhaler Inhale 1-2 puffs into the lungs every 6 (six) hours as needed for wheezing or shortness of breath.  Marland Kitchen amLODipine (NORVASC) 5 MG tablet Take 1 tablet (5 mg total) by mouth at bedtime.  Marland Kitchen Dextromethorphan-Guaifenesin (MUCINEX DM MAXIMUM STRENGTH) 60-1200 MG TB12 Take 1 tablet by mouth every 12 (twelve) hours.  . famotidine (PEPCID) 20 MG tablet One after supper  . fexofenadine (ALLEGRA) 180 MG tablet Take 180 mg by mouth daily.  .  furosemide (LASIX) 20 MG tablet Take 1 tablet (20 mg total) by mouth 2 (two) times daily.  Marland Kitchen levothyroxine (SYNTHROID) 150 MCG tablet Take 1 tablet (150 mcg total) by mouth daily.  . Multiple Vitamin (MULTIVITAMIN WITH MINERALS) TABS tablet Take 1 tablet by mouth daily.  . pantoprazole (PROTONIX) 40 MG tablet Take 1 tablet (40 mg total) by mouth daily. Take 30-60 min before first meal of the day  . [DISCONTINUED] amLODipine (NORVASC) 5 MG tablet Take 5 mg by mouth at bedtime.  . [DISCONTINUED] levothyroxine (SYNTHROID) 150 MCG tablet Take 150 mcg by mouth daily.  Marland Kitchen MYRBETRIQ 25 MG TB24 tablet Take 25 mg by mouth daily.  . rivaroxaban (XARELTO) 20 MG TABS tablet Take 1 tablet (20 mg total) by mouth daily with supper.   No facility-administered encounter medications on file as of 01/11/2020.     Review of Systems  Review of Systems  Constitutional: Negative.  Negative for chills and fever.  HENT: Negative.   Respiratory: Negative for cough and shortness of breath.   Cardiovascular: Positive for leg swelling (chronic).  Gastrointestinal: Negative.   Allergic/Immunologic: Negative.   Neurological: Negative.   Psychiatric/Behavioral: Positive for decreased concentration.       Physical Exam  BP 110/70 (BP Location: Left Arm, Patient Position: Sitting, Cuff Size: Large)   Pulse 75   Temp (!) 97 F (36.1 C)   Ht 5\' 7"  (1.702 m)   Wt (!) 315 lb 8 oz (143.1 kg)   SpO2 97%   BMI 49.41 kg/m   Wt Readings from Last 5 Encounters:  01/11/20 (!) 315 lb 8 oz (143.1 kg)  01/04/20 (!) 313 lb 8 oz (142.2 kg)  12/31/19 (!) 315 lb (142.9 kg)  12/27/19 (!) 313 lb 6.4 oz (142.2 kg)  11/17/19 (!) 311 lb (141.1 kg)     Physical Exam Vitals and nursing note reviewed.  Constitutional:      General: She is not in acute distress.    Appearance: She is well-developed.  Cardiovascular:     Rate and Rhythm: Normal rate and regular rhythm.  Pulmonary:     Effort: Pulmonary effort is normal.      Breath sounds: Normal breath sounds.  Musculoskeletal:     Right lower leg: Edema present.     Left lower leg: Edema present.  Neurological:     Mental Status: She is alert and oriented to person, place, and time.       Imaging: DG Chest 2 View  Result Date: 12/27/2019 CLINICAL DATA:  Shortness of breath EXAM: CHEST - 2 VIEW COMPARISON:  11/08/2019 FINDINGS: Normal heart size and stable aortic tortuosity. There is no edema, consolidation, effusion, or pneumothorax. Generalized degenerative endplate spurring. IMPRESSION: No active cardiopulmonary disease. Electronically Signed   By: Monte Fantasia M.D.   On: 12/27/2019 11:29   ECHOCARDIOGRAM COMPLETE  Result Date: 12/22/2019    ECHOCARDIOGRAM REPORT   Patient Name:  Juanita Craver Zimmermann Date of Exam: 12/22/2019 Medical Rec #:  [id number]      Height:       67.0 in Accession #:    [id number]     Weight:       311.0 lb Date of Birth:  May 19, 1951       BSA:          2.440 m Patient Age:    56 years       BP:           125/71 mmHg Patient Gender: F              HR:           69 bpm. Exam Location:  [facility] Procedure: 2D Echo, Cardiac Doppler and Color Doppler Indications:    I50.810 Right-sided heart failure; R00.1 Bradycardia,                 unspecified  History:        Patient has prior history of Echocardiogram examinations, most                 recent 08/22/2019. Risk Factors:Hypertension. COVID-19.                 Pulmonary embolism. Hyperthyroidism.  Sonographer:    Diamond Nickel RCS Referring Phys: [phone] [place]  1. Left ventricular ejection fraction, by estimation, is 60 to 65%. The left ventricle has normal function. The left ventricle has no regional wall motion abnormalities. Left ventricular diastolic parameters are indeterminate.  2. Right ventricular systolic function is normal. The right ventricular size is not well visualized. Tricuspid regurgitation signal is inadequate for assessing PA pressure.  3. The  mitral valve is grossly normal. Trivial mitral valve regurgitation. No evidence of mitral stenosis.  4. The aortic valve was not well visualized. Aortic valve regurgitation is not visualized. No aortic stenosis is present.  5. The inferior vena cava is normal in size with greater than 50% respiratory variability, suggesting right atrial pressure of 3 mmHg. Comparison(s): No significant change from prior study. Conclusion(s)/Recommendation(s): Technically difficult study. Normal LVEF without apparent wall motion abnormalities. No hemodynamically significant valve disease by doppler. Indeterminate diastolic function. Right atrial pressure appears normal. FINDINGS  Left Ventricle: Left ventricular ejection fraction, by estimation, is 60 to 65%. The left ventricle has normal function. The left ventricle has no regional wall motion abnormalities. The left ventricular internal cavity size was normal in size. There is  no left ventricular hypertrophy. Left ventricular diastolic parameters are indeterminate. Right Ventricle: The right ventricular size is not well visualized. Right vetricular wall thickness was not assessed. Right ventricular systolic function is normal. Tricuspid regurgitation signal is inadequate for assessing PA pressure. Left Atrium: Left atrial size was not well visualized. Right Atrium: Right atrial size was not well visualized. Pericardium: There is no evidence of pericardial effusion. Presence of pericardial fat pad. Mitral Valve: The mitral valve is grossly normal. Trivial mitral valve regurgitation. No evidence of mitral valve stenosis. Tricuspid Valve: The tricuspid valve is normal in structure. Tricuspid valve regurgitation is not demonstrated. No evidence of tricuspid stenosis. Aortic Valve: The aortic valve was not well visualized. Aortic valve regurgitation is not visualized. No aortic stenosis is present. Pulmonic Valve: The pulmonic valve was not well visualized. Pulmonic valve regurgitation  is not visualized. No evidence of pulmonic stenosis. Aorta: The aortic root and ascending aorta are structurally normal, with no evidence of dilitation. Pulmonary Artery: The pulmonary artery  is not well seen. Venous: The inferior vena cava is normal in size with greater than 50% respiratory variability, suggesting right atrial pressure of 3 mmHg. IAS/Shunts: The atrial septum is grossly normal.  LEFT VENTRICLE PLAX 2D LVIDd:         4.36 cm  Diastology LVIDs:         2.96 cm  LV e' lateral:   8.70 cm/s LV PW:         1.14 cm  LV E/e' lateral: 5.5 LV IVS:        1.52 cm  LV e' medial:    5.22 cm/s LVOT diam:     2.45 cm  LV E/e' medial:  9.2 LV SV:         92 LV SV Index:   38 LVOT Area:     4.71 cm  RIGHT VENTRICLE RV S prime:     8.92 cm/s LEFT ATRIUM         Index LA diam:    3.00 cm 1.23 cm/m  AORTIC VALVE LVOT Vmax:   84.20 cm/s LVOT Vmean:  56.800 cm/s LVOT VTI:    0.195 m  AORTA Ao Root diam: 3.30 cm MITRAL VALVE MV Area (PHT): 1.94 cm    SHUNTS MV Decel Time: 391 msec    Systemic VTI:  0.20 m MV E velocity: 48.00 cm/s  Systemic Diam: 2.45 cm MV A velocity: 62.10 cm/s MV E/A ratio:  0.77 Buford Dresser MD Electronically signed by Buford Dresser MD Signature Date/Time: 12/22/2019/8:17:47 PM    Final    LONG TERM MONITOR (3-14 DAYS)  Result Date: 12/31/2019 ~7 days of data recorded on Zio monitor. Patient had a min HR of 44 bpm, max HR of 174 bpm, and avg HR of 67 bpm. Predominant underlying rhythm was Sinus Rhythm. No VT, atrial fibrillation, high degree block, or pauses noted. Isolated atrial and ventricular ectopy was rare (<1%). There were 5 triggered events. These were sinus rhythm with/without PVC. There were 10 SVT events, the fastest interval lasting 7 beats with a max rate of 174 bpm, the longest 21 beats/9.3 secs with an avg rate of 131 bpm.    Assessment & Plan:   History of COVID-19 Pulmonary embolism: Continue Xarelto Please keep follow up with pulmonary Please keep  follow up with Cardiology   Covid pneumonia: Recent chest x ray was clear  Pulmonary nodule: Continue to follow up with pulmonary   Edema: followed by PCP/ cardiology Low sodium diet Wear compression hose Keep legs elevated Continue diuretics as prescribed by PCP/cardiology  Memory loss and numbness to upper and lower extremities: Continue follow up with neurology Neurology  Will place referral to neuropsychology  Keep follow up appointment with PCP   Covid Support Group:  [employer]  Follow up: As needed      Fenton Foy, NP 01/12/2020

## 2020-01-11 NOTE — Patient Instructions (Addendum)
Pulmonary embolism: Continue Xarelto Please keep follow up with pulmonary Please keep follow up with Cardiology   Covid pneumonia: Recent chest x ray was clear  Pulmonary nodule: Continue to follow up with pulmonary   Edema: followed by PCP/ cardiology Low sodium diet Wear compression hose Keep legs elevated Continue diuretics as prescribed by PCP/cardiology  Memory loss and numbness to upper and lower extremities: Continue follow up with neurology Neurology  Will place referral to neuropsychology  Keep follow up appointment with PCP   Covid Support Group:  [employer]  Follow up: As needed

## 2020-01-12 DIAGNOSIS — E890 Postprocedural hypothyroidism: Secondary | ICD-10-CM | POA: Diagnosis not present

## 2020-01-12 DIAGNOSIS — J1282 Pneumonia due to coronavirus disease 2019: Secondary | ICD-10-CM | POA: Diagnosis not present

## 2020-01-12 DIAGNOSIS — U071 COVID-19: Secondary | ICD-10-CM | POA: Diagnosis not present

## 2020-01-12 DIAGNOSIS — I2699 Other pulmonary embolism without acute cor pulmonale: Secondary | ICD-10-CM | POA: Diagnosis not present

## 2020-01-12 DIAGNOSIS — J9601 Acute respiratory failure with hypoxia: Secondary | ICD-10-CM | POA: Diagnosis not present

## 2020-01-12 DIAGNOSIS — I10 Essential (primary) hypertension: Secondary | ICD-10-CM | POA: Diagnosis not present

## 2020-01-12 DIAGNOSIS — G5621 Lesion of ulnar nerve, right upper limb: Secondary | ICD-10-CM | POA: Diagnosis not present

## 2020-01-12 DIAGNOSIS — G4733 Obstructive sleep apnea (adult) (pediatric): Secondary | ICD-10-CM | POA: Diagnosis not present

## 2020-01-12 NOTE — Assessment & Plan Note (Signed)
Pulmonary embolism: Continue Xarelto Please keep follow up with pulmonary Please keep follow up with Cardiology   Covid pneumonia: Recent chest x ray was clear  Pulmonary nodule: Continue to follow up with pulmonary   Edema: followed by PCP/ cardiology Low sodium diet Wear compression hose Keep legs elevated Continue diuretics as prescribed by PCP/cardiology  Memory loss and numbness to upper and lower extremities: Continue follow up with neurology Neurology  Will place referral to neuropsychology  Keep follow up appointment with PCP   Covid Support Group:  [employer]  Follow up: As needed

## 2020-01-12 NOTE — Telephone Encounter (Signed)
scheduled for 01/20/20

## 2020-01-13 DIAGNOSIS — J1282 Pneumonia due to coronavirus disease 2019: Secondary | ICD-10-CM | POA: Diagnosis not present

## 2020-01-13 DIAGNOSIS — E890 Postprocedural hypothyroidism: Secondary | ICD-10-CM | POA: Diagnosis not present

## 2020-01-13 DIAGNOSIS — I2699 Other pulmonary embolism without acute cor pulmonale: Secondary | ICD-10-CM | POA: Diagnosis not present

## 2020-01-13 DIAGNOSIS — J9601 Acute respiratory failure with hypoxia: Secondary | ICD-10-CM | POA: Diagnosis not present

## 2020-01-13 DIAGNOSIS — I10 Essential (primary) hypertension: Secondary | ICD-10-CM | POA: Diagnosis not present

## 2020-01-13 DIAGNOSIS — G4733 Obstructive sleep apnea (adult) (pediatric): Secondary | ICD-10-CM | POA: Diagnosis not present

## 2020-01-13 DIAGNOSIS — U071 COVID-19: Secondary | ICD-10-CM | POA: Diagnosis not present

## 2020-01-13 DIAGNOSIS — G5621 Lesion of ulnar nerve, right upper limb: Secondary | ICD-10-CM | POA: Diagnosis not present

## 2020-01-17 DIAGNOSIS — U071 COVID-19: Secondary | ICD-10-CM | POA: Diagnosis not present

## 2020-01-17 DIAGNOSIS — E890 Postprocedural hypothyroidism: Secondary | ICD-10-CM | POA: Diagnosis not present

## 2020-01-17 DIAGNOSIS — I10 Essential (primary) hypertension: Secondary | ICD-10-CM | POA: Diagnosis not present

## 2020-01-17 DIAGNOSIS — G5621 Lesion of ulnar nerve, right upper limb: Secondary | ICD-10-CM | POA: Diagnosis not present

## 2020-01-17 DIAGNOSIS — J1282 Pneumonia due to coronavirus disease 2019: Secondary | ICD-10-CM | POA: Diagnosis not present

## 2020-01-17 DIAGNOSIS — I2699 Other pulmonary embolism without acute cor pulmonale: Secondary | ICD-10-CM | POA: Diagnosis not present

## 2020-01-17 DIAGNOSIS — J9601 Acute respiratory failure with hypoxia: Secondary | ICD-10-CM | POA: Diagnosis not present

## 2020-01-17 DIAGNOSIS — G4733 Obstructive sleep apnea (adult) (pediatric): Secondary | ICD-10-CM | POA: Diagnosis not present

## 2020-01-20 DIAGNOSIS — M5001 Cervical disc disorder with myelopathy,  high cervical region: Secondary | ICD-10-CM | POA: Diagnosis not present

## 2020-01-20 DIAGNOSIS — G9589 Other specified diseases of spinal cord: Secondary | ICD-10-CM | POA: Diagnosis not present

## 2020-01-20 DIAGNOSIS — M4712 Other spondylosis with myelopathy, cervical region: Secondary | ICD-10-CM | POA: Diagnosis not present

## 2020-01-20 DIAGNOSIS — R202 Paresthesia of skin: Secondary | ICD-10-CM | POA: Diagnosis not present

## 2020-01-21 ENCOUNTER — Telehealth: Payer: Self-pay | Admitting: Cardiology

## 2020-01-21 DIAGNOSIS — U071 COVID-19: Secondary | ICD-10-CM | POA: Diagnosis not present

## 2020-01-21 DIAGNOSIS — G5621 Lesion of ulnar nerve, right upper limb: Secondary | ICD-10-CM | POA: Diagnosis not present

## 2020-01-21 DIAGNOSIS — I2699 Other pulmonary embolism without acute cor pulmonale: Secondary | ICD-10-CM | POA: Diagnosis not present

## 2020-01-21 DIAGNOSIS — J9601 Acute respiratory failure with hypoxia: Secondary | ICD-10-CM | POA: Diagnosis not present

## 2020-01-21 DIAGNOSIS — G4733 Obstructive sleep apnea (adult) (pediatric): Secondary | ICD-10-CM | POA: Diagnosis not present

## 2020-01-21 DIAGNOSIS — J1282 Pneumonia due to coronavirus disease 2019: Secondary | ICD-10-CM | POA: Diagnosis not present

## 2020-01-21 DIAGNOSIS — I10 Essential (primary) hypertension: Secondary | ICD-10-CM | POA: Diagnosis not present

## 2020-01-21 DIAGNOSIS — E890 Postprocedural hypothyroidism: Secondary | ICD-10-CM | POA: Diagnosis not present

## 2020-01-21 NOTE — Telephone Encounter (Signed)
New Message    Natasha [name] is calling and is requesting the cardiac diagnosis codes for the pt     Please call

## 2020-01-24 DIAGNOSIS — I10 Essential (primary) hypertension: Secondary | ICD-10-CM | POA: Diagnosis not present

## 2020-01-24 DIAGNOSIS — U071 COVID-19: Secondary | ICD-10-CM | POA: Diagnosis not present

## 2020-01-24 DIAGNOSIS — G5621 Lesion of ulnar nerve, right upper limb: Secondary | ICD-10-CM | POA: Diagnosis not present

## 2020-01-24 DIAGNOSIS — J1282 Pneumonia due to coronavirus disease 2019: Secondary | ICD-10-CM | POA: Diagnosis not present

## 2020-01-24 DIAGNOSIS — I2699 Other pulmonary embolism without acute cor pulmonale: Secondary | ICD-10-CM | POA: Diagnosis not present

## 2020-01-24 DIAGNOSIS — G4733 Obstructive sleep apnea (adult) (pediatric): Secondary | ICD-10-CM | POA: Diagnosis not present

## 2020-01-24 DIAGNOSIS — E890 Postprocedural hypothyroidism: Secondary | ICD-10-CM | POA: Diagnosis not present

## 2020-01-24 DIAGNOSIS — J9601 Acute respiratory failure with hypoxia: Secondary | ICD-10-CM | POA: Diagnosis not present

## 2020-01-25 DIAGNOSIS — G4733 Obstructive sleep apnea (adult) (pediatric): Secondary | ICD-10-CM | POA: Diagnosis not present

## 2020-01-25 DIAGNOSIS — U071 COVID-19: Secondary | ICD-10-CM | POA: Diagnosis not present

## 2020-01-25 DIAGNOSIS — E890 Postprocedural hypothyroidism: Secondary | ICD-10-CM | POA: Diagnosis not present

## 2020-01-25 DIAGNOSIS — I2699 Other pulmonary embolism without acute cor pulmonale: Secondary | ICD-10-CM | POA: Diagnosis not present

## 2020-01-25 DIAGNOSIS — J1282 Pneumonia due to coronavirus disease 2019: Secondary | ICD-10-CM | POA: Diagnosis not present

## 2020-01-25 DIAGNOSIS — I10 Essential (primary) hypertension: Secondary | ICD-10-CM | POA: Diagnosis not present

## 2020-01-25 DIAGNOSIS — G5621 Lesion of ulnar nerve, right upper limb: Secondary | ICD-10-CM | POA: Diagnosis not present

## 2020-01-25 DIAGNOSIS — J9601 Acute respiratory failure with hypoxia: Secondary | ICD-10-CM | POA: Diagnosis not present

## 2020-01-27 ENCOUNTER — Telehealth: Payer: Self-pay | Admitting: Neurology

## 2020-01-27 DIAGNOSIS — R202 Paresthesia of skin: Secondary | ICD-10-CM

## 2020-01-27 NOTE — Telephone Encounter (Signed)
I spoke to the patient and she is aware of the results below. She is agreeable to NCV/EMG and has been scheduled on 02/28/20.

## 2020-01-27 NOTE — Telephone Encounter (Signed)
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 No current cord compression at this level  Contact and ventral buckling of the spinal cord at C3-4 in the setting of ACDF related to a focal central disc osteophyte complex  Severe bilateral foraminal stenosis at C7-T1, severe left foraminal stenosis at T1-T2, and severe right foraminal stenosis at T2-3.  Moderate bilateral foraminal stenosis at C1-C2.  Left facet arthrosis an bony foraminal stenosis at C2-3

## 2020-01-28 ENCOUNTER — Ambulatory Visit: Payer: Medicare HMO | Admitting: Internal Medicine

## 2020-02-02 DIAGNOSIS — G4733 Obstructive sleep apnea (adult) (pediatric): Secondary | ICD-10-CM | POA: Diagnosis not present

## 2020-02-02 DIAGNOSIS — E890 Postprocedural hypothyroidism: Secondary | ICD-10-CM | POA: Diagnosis not present

## 2020-02-02 DIAGNOSIS — I10 Essential (primary) hypertension: Secondary | ICD-10-CM | POA: Diagnosis not present

## 2020-02-02 DIAGNOSIS — J1282 Pneumonia due to coronavirus disease 2019: Secondary | ICD-10-CM | POA: Diagnosis not present

## 2020-02-02 DIAGNOSIS — G5621 Lesion of ulnar nerve, right upper limb: Secondary | ICD-10-CM | POA: Diagnosis not present

## 2020-02-02 DIAGNOSIS — J9601 Acute respiratory failure with hypoxia: Secondary | ICD-10-CM | POA: Diagnosis not present

## 2020-02-02 DIAGNOSIS — I2699 Other pulmonary embolism without acute cor pulmonale: Secondary | ICD-10-CM | POA: Diagnosis not present

## 2020-02-02 DIAGNOSIS — U071 COVID-19: Secondary | ICD-10-CM | POA: Diagnosis not present

## 2020-02-03 ENCOUNTER — Encounter: Payer: Self-pay | Admitting: Cardiology

## 2020-02-03 ENCOUNTER — Other Ambulatory Visit: Payer: Self-pay

## 2020-02-03 ENCOUNTER — Ambulatory Visit (INDEPENDENT_AMBULATORY_CARE_PROVIDER_SITE_OTHER): Payer: Medicare HMO | Admitting: Cardiology

## 2020-02-03 VITALS — BP 125/80 | HR 57 | Ht 67.0 in | Wt 307.6 lb

## 2020-02-03 DIAGNOSIS — Z8616 Personal history of COVID-19: Secondary | ICD-10-CM | POA: Diagnosis not present

## 2020-02-03 DIAGNOSIS — Z7189 Other specified counseling: Secondary | ICD-10-CM

## 2020-02-03 DIAGNOSIS — Z86711 Personal history of pulmonary embolism: Secondary | ICD-10-CM | POA: Diagnosis not present

## 2020-02-03 DIAGNOSIS — U071 COVID-19: Secondary | ICD-10-CM | POA: Diagnosis not present

## 2020-02-03 DIAGNOSIS — J9601 Acute respiratory failure with hypoxia: Secondary | ICD-10-CM | POA: Diagnosis not present

## 2020-02-03 DIAGNOSIS — I2699 Other pulmonary embolism without acute cor pulmonale: Secondary | ICD-10-CM | POA: Diagnosis not present

## 2020-02-03 DIAGNOSIS — R600 Localized edema: Secondary | ICD-10-CM | POA: Diagnosis not present

## 2020-02-03 DIAGNOSIS — I10 Essential (primary) hypertension: Secondary | ICD-10-CM | POA: Diagnosis not present

## 2020-02-03 DIAGNOSIS — G4733 Obstructive sleep apnea (adult) (pediatric): Secondary | ICD-10-CM | POA: Diagnosis not present

## 2020-02-03 DIAGNOSIS — E890 Postprocedural hypothyroidism: Secondary | ICD-10-CM | POA: Diagnosis not present

## 2020-02-03 DIAGNOSIS — J1282 Pneumonia due to coronavirus disease 2019: Secondary | ICD-10-CM | POA: Diagnosis not present

## 2020-02-03 DIAGNOSIS — G5621 Lesion of ulnar nerve, right upper limb: Secondary | ICD-10-CM | POA: Diagnosis not present

## 2020-02-03 NOTE — Patient Instructions (Signed)

## 2020-02-03 NOTE — Progress Notes (Signed)
Cardiology Office Note:    Date:  02/03/2020   ID:  Natasha [name], DOB 25-Jan-1951, MRN [id number]  PCP:  Lucianne Lei, MD  Cardiologist:  Buford Dresser, MD  Referring MD: Lucianne Lei, MD   CC: follow up  History of Present Illness:    Natasha [name] is a 69 y.o. female with a hx of Covid-19 infection in 08/2019, pulmonary embolism, hypertension, hyperthyroidism who is seen for follow up. I initially saw her 11/17/19 as a new consult at the request of Lucianne Lei, MD for the evaluation and management of post-Covid/post-PE management and bradycardia.  History: She required hospitalization for Covid pneumonia from 08/21/19-09/06/19. She received steroids and remdesivir. During her admission, she was diagnosed with submassive PE and started on rivaroxaban. She was recommended for cardiology post discharge follow up for bradycardia and monitoring of RV function.  She was seen by Dr. Radford Pax during her admission, note dated 08/23/19. Noted to have sinus bradycardia in the 40s and 50s.   Today: Doing better. Trying to start post Covid resource group, starting 6/8.   Breathing is slowly improving. Swelling also improved. On home scale, weight is 301 lbs, down from 315 lbs. Watching diet, eating fewer carbs. Increasing walking, up to 6 labs in her driveway.   This is her second blood clot, has been told she will be on rivaroxaban for life at this point.  Denies chest pain, shortness of breath at rest. No PND, orthopnea, worsening LE edema or unexpected weight gain. No syncope or palpitations.  Past Medical History:  Diagnosis Date  . 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 Complication of anesthesia    trouble with airway  . Edema 12/22/2008  . History of COVID-19 08/2019  . HYPERTENSION 12/25/2007  . HYPOTHYROIDISM,  POSTSURGICAL 05/04/2008  . LEG PAIN, LEFT 12/09/2008  . Muscle weakness   . Numbness and tingling   . OSA (obstructive sleep apnea) 06/15/2013    Past Surgical History:  Procedure Laterality Date  . ABDOMINAL HYSTERECTOMY    . ANKLE SURGERY    . CERVICAL FUSION     x 2  . COLONOSCOPY WITH PROPOFOL N/A 08/08/2015   Procedure: COLONOSCOPY WITH PROPOFOL;  Surgeon: Juanita Craver, MD;  Location: [facility];  Service: Endoscopy;  Laterality: N/A;  . KNEE SURGERY    . TOTAL THYROIDECTOMY      Current Medications: Current Outpatient Medications on File Prior to Visit  Medication Sig  . albuterol (VENTOLIN HFA) 108 (90 Base) MCG/ACT inhaler Inhale 1-2 puffs into the lungs every 6 (six) hours as needed for wheezing or shortness of breath.  Marland Kitchen amLODipine (NORVASC) 5 MG tablet Take 1 tablet (5 mg total) by mouth at bedtime.  Marland Kitchen Dextromethorphan-Guaifenesin (MUCINEX DM MAXIMUM STRENGTH) 60-1200 MG TB12 Take 1 tablet by mouth every 12 (twelve) hours.  . famotidine (PEPCID) 20 MG tablet One after supper  . fexofenadine (ALLEGRA) 180 MG tablet Take 180 mg by mouth daily.  . furosemide (LASIX) 20 MG tablet Take 1 tablet (20 mg total) by mouth 2 (two) times daily.  Marland Kitchen levothyroxine (SYNTHROID) 150 MCG tablet Take 1 tablet (150 mcg total) by mouth daily.  . Multiple Vitamin (MULTIVITAMIN WITH MINERALS) TABS tablet Take 1 tablet by mouth daily.  Marland Kitchen MYRBETRIQ 25 MG TB24 tablet Take 25 mg by mouth daily.  Marland Kitchen  pantoprazole (PROTONIX) 40 MG tablet Take 1 tablet (40 mg total) by mouth daily. Take 30-60 min before first meal of the day  . rivaroxaban (XARELTO) 20 MG TABS tablet Take 1 tablet (20 mg total) by mouth daily with supper.   No current facility-administered medications on file prior to visit.     Allergies:   Codeine and Eggs or egg-derived products   Social History   Tobacco Use  . Smoking status: Never Smoker  . Smokeless tobacco: Never Used  Substance Use Topics  . Alcohol use: Yes    Comment:  5 drinks/year  . Drug use: No    Family History: family history includes Heart disease in her mother; Kidney disease in her mother; Other in her father.  ROS:   Please see the history of present illness.  Additional pertinent ROS otherwise unremarkable.  EKGs/Labs/Other Studies Reviewed:    The following studies were reviewed today: Echo 12/22/19 1. Left ventricular ejection fraction, by estimation, is 60 to 65%. The  left ventricle has normal function. The left ventricle has no regional  wall motion abnormalities. Left ventricular diastolic parameters are  indeterminate.  2. Right ventricular systolic function is normal. The right ventricular  size is not well visualized. Tricuspid regurgitation signal is inadequate  for assessing PA pressure.  3. The mitral valve is grossly normal. Trivial mitral valve  regurgitation. No evidence of mitral stenosis.  4. The aortic valve was not well visualized. Aortic valve regurgitation  is not visualized. No aortic stenosis is present.  5. The inferior vena cava is normal in size with greater than 50%  respiratory variability, suggesting right atrial pressure of 3 mmHg.   Comparison(s): No significant change from prior study.   Monitor 12/31/19 ~7 days of data recorded on Zio monitor. Patient had a min HR of 44 bpm, max HR of 174 bpm, and avg HR of 67 bpm. Predominant underlying rhythm was Sinus Rhythm. No VT, atrial fibrillation, high degree block, or pauses noted. Isolated atrial and ventricular ectopy was rare (<1%). There were 5 triggered events. These were sinus rhythm with/without PVC. There were 10 SVT events, the fastest interval lasting 7 beats with a max rate of 174 bpm, the longest 21 beats/9.3 secs with an avg rate of 131 bpm.   Echo 08/22/19 1. Left ventricular ejection fraction, by visual estimation, is 65 to  70%. The left ventricle has hyperdynamic function. There is no left  ventricular hypertrophy.  2. The left  ventricle has no regional wall motion abnormalities.  3. Global right ventricle has low normal systolic function.The right  ventricular size is mildly enlarged. No increase in right ventricular wall  thickness.  4. Presence of pericardial fat pad.  5. The mitral valve is grossly normal. Trivial mitral valve  regurgitation.  6. The tricuspid valve is grossly normal. Tricuspid valve regurgitation  is not demonstrated.  7. The tricuspid valve was normal in structure. Tricuspid valve  regurgitation is not demonstrated.  8. The aortic valve is grossly normal. Aortic valve regurgitation is not  visualized. No evidence of aortic valve sclerosis or stenosis.  9. The inferior vena cava is dilated in size with <50% respiratory  variability, suggesting right atrial pressure of 15 mmHg.  10. Technically challenging study. Normal LV function. RV appears to be  mildly enlarged, with borderline normal function. Unable to visualize PA  well.  11. The interatrial septum was not assessed.   EKG:  EKG is personally reviewed.  The ekg ordered 11/17/19  demonstrates NSR  Recent Labs: 09/02/2019: TSH 0.288 09/03/2019: B Natriuretic Peptide 30.7 09/05/2019: ALT 16; Hemoglobin 11.7; Magnesium 2.2; Platelets 232 12/16/2019: BUN 20; Creatinine, Ser 0.89; Potassium 4.1; Sodium 141  Recent Lipid Panel    Component Value Date/Time   CHOL 174 09/04/2019 1230   TRIG 89 09/04/2019 1230   HDL 76 09/04/2019 1230   CHOLHDL 2.3 09/04/2019 1230   VLDL 18 09/04/2019 1230   LDLCALC 80 09/04/2019 1230    Physical Exam:    VS:  BP 125/80   Pulse (!) 57   Ht 5\' 7"  (1.702 m)   Wt (!) 307 lb 9.6 oz (139.5 kg)   SpO2 99%   BMI 48.18 kg/m     Wt Readings from Last 3 Encounters:  02/03/20 (!) 307 lb 9.6 oz (139.5 kg)  01/11/20 (!) 315 lb 8 oz (143.1 kg)  01/04/20 (!) 313 lb 8 oz (142.2 kg)    GEN: Well nourished, well developed in no acute distress HEENT: Normal, moist mucous membranes NECK: No JVD CARDIAC:  regular rhythm, normal S1 and S2, no rubs or gallops. No murmur. VASCULAR: Radial and DP pulses 2+ bilaterally. No carotid bruits RESPIRATORY:  Clear to auscultation without rales, wheezing or rhonchi  ABDOMEN: Soft, non-tender, non-distended MUSCULOSKELETAL:  Ambulates independently SKIN: Warm and dry, bilateral trace mixed pitting and nonpitting edema NEUROLOGIC:  Alert and oriented x 3. No focal neuro deficits noted. PSYCHIATRIC:  Normal affect   ASSESSMENT:    1. History of 2019 novel coronavirus disease (COVID-19)   2. History of pulmonary embolism   3. Bilateral leg edema   4. Essential hypertension   5. Counseling on health promotion and disease prevention   6. Cardiac risk counseling    PLAN:    History of Covid pneumonia and pulmonary embolism, with evidence of right sided heart failure at diagnosis: very mild LE edema persists, shortness of breath improving. -gradually improving. On echo 12/2019, right atrial pressure normal, TR inadequate for RVSP, RV normal -counseled on need to continue using CPAP for OSA -counseled on daily weights, salt avoidance -counseled on compression stockings and leg elevation. With normal right atrial pressure, suspect component of chronic venous insufficiency -remains on rivaroxaban, likely lifelong  Hypertension: well controlled today -continued amlodipine. Do not suspect that 5 mg dose is contributing significantly to LE edema -continue furosemide 20 mg BID  Cardiac risk counseling and prevention recommendations: -recommend heart healthy/Mediterranean diet, with whole grains, fruits, vegetable, fish, lean meats, nuts, and olive oil. Limit salt. -recommend moderate walking, 3-5 times/week for 30-50 minutes each session. Aim for at least 150 minutes.week. Goal should be pace of 3 miles/hours, or walking 1.5 miles in 30 minutes. This is a long term goal. -recommend avoidance of tobacco products. Avoid excess alcohol. -ASCVD risk score: she  denies diabetes (A1c 5.8), so this is an overestimate The 10-year ASCVD risk score Mikey Bussing DC Jr., et al., 2013) is: 21.6%   Values used to calculate the score:     Age: 89 years     Sex: Female     Is Non-Hispanic African American: Yes     Diabetic: Yes     Tobacco smoker: No     Systolic Blood Pressure: 0000000 mmHg     Is BP treated: Yes     HDL Cholesterol: 76 mg/dL     Total Cholesterol: 174 mg/dL    Plan for follow up: 6 months or sooner as needed  Buford Dresser, MD, PhD [facility]  [facility]  [facility]   Medication Adjustments/Labs and Tests Ordered: Current medicines are reviewed at length with the patient today.  Concerns regarding medicines are outlined above.  No orders of the defined types were placed in this encounter.  No orders of the defined types were placed in this encounter.   Patient Instructions  Medication Instructions:  Your Physician recommend you continue on your current medication as directed.    *If you need a refill on your cardiac medications before your next appointment, please call your pharmacy*   Lab Work: None   Testing/Procedures: None   Follow-Up: At [facility], you and your health needs are our priority.  As part of our continuing mission to provide you with exceptional heart care, we have created designated Provider Care Teams.  These Care Teams include your primary Cardiologist (physician) and Advanced Practice Providers (APPs -  Physician Assistants and Nurse Practitioners) who all work together to provide you with the care you need, when you need it.  We recommend signing up for the patient portal called "MyChart".  Sign up information is provided on this After Visit Summary.  MyChart is used to connect with patients for Virtual Visits (Telemedicine).  Patients are able to view lab/test results, encounter notes, upcoming appointments, etc.  Non-urgent messages can be sent to your provider as well.   To learn more about what you  can do with MyChart, go to [email or web address].    Your next appointment:   6 month(s)  The format for your next appointment:   In Person  Provider:   Buford Dresser, MD      Signed, Buford Dresser, MD PhD 02/03/2020  [place]

## 2020-02-04 ENCOUNTER — Encounter: Payer: Medicare HMO | Admitting: Counselor

## 2020-02-07 ENCOUNTER — Ambulatory Visit: Payer: Medicare HMO | Admitting: Cardiology

## 2020-02-07 DIAGNOSIS — U071 COVID-19: Secondary | ICD-10-CM | POA: Diagnosis not present

## 2020-02-07 DIAGNOSIS — I10 Essential (primary) hypertension: Secondary | ICD-10-CM | POA: Diagnosis not present

## 2020-02-07 DIAGNOSIS — G5621 Lesion of ulnar nerve, right upper limb: Secondary | ICD-10-CM | POA: Diagnosis not present

## 2020-02-07 DIAGNOSIS — J9601 Acute respiratory failure with hypoxia: Secondary | ICD-10-CM | POA: Diagnosis not present

## 2020-02-07 DIAGNOSIS — I2699 Other pulmonary embolism without acute cor pulmonale: Secondary | ICD-10-CM | POA: Diagnosis not present

## 2020-02-07 DIAGNOSIS — J1282 Pneumonia due to coronavirus disease 2019: Secondary | ICD-10-CM | POA: Diagnosis not present

## 2020-02-07 DIAGNOSIS — G4733 Obstructive sleep apnea (adult) (pediatric): Secondary | ICD-10-CM | POA: Diagnosis not present

## 2020-02-07 DIAGNOSIS — E890 Postprocedural hypothyroidism: Secondary | ICD-10-CM | POA: Diagnosis not present

## 2020-02-09 DIAGNOSIS — J9601 Acute respiratory failure with hypoxia: Secondary | ICD-10-CM | POA: Diagnosis not present

## 2020-02-09 DIAGNOSIS — U071 COVID-19: Secondary | ICD-10-CM | POA: Diagnosis not present

## 2020-02-09 DIAGNOSIS — E890 Postprocedural hypothyroidism: Secondary | ICD-10-CM | POA: Diagnosis not present

## 2020-02-09 DIAGNOSIS — G4733 Obstructive sleep apnea (adult) (pediatric): Secondary | ICD-10-CM | POA: Diagnosis not present

## 2020-02-09 DIAGNOSIS — G5621 Lesion of ulnar nerve, right upper limb: Secondary | ICD-10-CM | POA: Diagnosis not present

## 2020-02-09 DIAGNOSIS — I10 Essential (primary) hypertension: Secondary | ICD-10-CM | POA: Diagnosis not present

## 2020-02-09 DIAGNOSIS — I2699 Other pulmonary embolism without acute cor pulmonale: Secondary | ICD-10-CM | POA: Diagnosis not present

## 2020-02-09 DIAGNOSIS — J1282 Pneumonia due to coronavirus disease 2019: Secondary | ICD-10-CM | POA: Diagnosis not present

## 2020-02-10 ENCOUNTER — Encounter: Payer: Self-pay | Admitting: Cardiology

## 2020-02-10 DIAGNOSIS — I471 Supraventricular tachycardia: Secondary | ICD-10-CM | POA: Insufficient documentation

## 2020-02-10 DIAGNOSIS — I890 Lymphedema, not elsewhere classified: Secondary | ICD-10-CM | POA: Insufficient documentation

## 2020-02-10 DIAGNOSIS — R600 Localized edema: Secondary | ICD-10-CM

## 2020-02-10 DIAGNOSIS — Z86711 Personal history of pulmonary embolism: Secondary | ICD-10-CM | POA: Insufficient documentation

## 2020-02-10 HISTORY — DX: Localized edema: R60.0

## 2020-02-11 ENCOUNTER — Encounter: Payer: Medicare HMO | Admitting: Counselor

## 2020-02-12 DIAGNOSIS — U071 COVID-19: Secondary | ICD-10-CM | POA: Diagnosis not present

## 2020-02-12 DIAGNOSIS — G5621 Lesion of ulnar nerve, right upper limb: Secondary | ICD-10-CM | POA: Diagnosis not present

## 2020-02-12 DIAGNOSIS — G4733 Obstructive sleep apnea (adult) (pediatric): Secondary | ICD-10-CM | POA: Diagnosis not present

## 2020-02-12 DIAGNOSIS — J9601 Acute respiratory failure with hypoxia: Secondary | ICD-10-CM | POA: Diagnosis not present

## 2020-02-12 DIAGNOSIS — E890 Postprocedural hypothyroidism: Secondary | ICD-10-CM | POA: Diagnosis not present

## 2020-02-12 DIAGNOSIS — J1282 Pneumonia due to coronavirus disease 2019: Secondary | ICD-10-CM | POA: Diagnosis not present

## 2020-02-12 DIAGNOSIS — I10 Essential (primary) hypertension: Secondary | ICD-10-CM | POA: Diagnosis not present

## 2020-02-12 DIAGNOSIS — I2699 Other pulmonary embolism without acute cor pulmonale: Secondary | ICD-10-CM | POA: Diagnosis not present

## 2020-02-18 DIAGNOSIS — G5621 Lesion of ulnar nerve, right upper limb: Secondary | ICD-10-CM | POA: Diagnosis not present

## 2020-02-18 DIAGNOSIS — G4733 Obstructive sleep apnea (adult) (pediatric): Secondary | ICD-10-CM | POA: Diagnosis not present

## 2020-02-18 DIAGNOSIS — I10 Essential (primary) hypertension: Secondary | ICD-10-CM | POA: Diagnosis not present

## 2020-02-18 DIAGNOSIS — U071 COVID-19: Secondary | ICD-10-CM | POA: Diagnosis not present

## 2020-02-18 DIAGNOSIS — E890 Postprocedural hypothyroidism: Secondary | ICD-10-CM | POA: Diagnosis not present

## 2020-02-18 DIAGNOSIS — I2699 Other pulmonary embolism without acute cor pulmonale: Secondary | ICD-10-CM | POA: Diagnosis not present

## 2020-02-18 DIAGNOSIS — J9601 Acute respiratory failure with hypoxia: Secondary | ICD-10-CM | POA: Diagnosis not present

## 2020-02-18 DIAGNOSIS — J1282 Pneumonia due to coronavirus disease 2019: Secondary | ICD-10-CM | POA: Diagnosis not present

## 2020-02-21 DIAGNOSIS — E890 Postprocedural hypothyroidism: Secondary | ICD-10-CM | POA: Diagnosis not present

## 2020-02-21 DIAGNOSIS — G4733 Obstructive sleep apnea (adult) (pediatric): Secondary | ICD-10-CM | POA: Diagnosis not present

## 2020-02-21 DIAGNOSIS — I2699 Other pulmonary embolism without acute cor pulmonale: Secondary | ICD-10-CM | POA: Diagnosis not present

## 2020-02-21 DIAGNOSIS — U071 COVID-19: Secondary | ICD-10-CM | POA: Diagnosis not present

## 2020-02-21 DIAGNOSIS — G5621 Lesion of ulnar nerve, right upper limb: Secondary | ICD-10-CM | POA: Diagnosis not present

## 2020-02-21 DIAGNOSIS — J1282 Pneumonia due to coronavirus disease 2019: Secondary | ICD-10-CM | POA: Diagnosis not present

## 2020-02-21 DIAGNOSIS — J9601 Acute respiratory failure with hypoxia: Secondary | ICD-10-CM | POA: Diagnosis not present

## 2020-02-21 DIAGNOSIS — I10 Essential (primary) hypertension: Secondary | ICD-10-CM | POA: Diagnosis not present

## 2020-02-22 DIAGNOSIS — E785 Hyperlipidemia, unspecified: Secondary | ICD-10-CM | POA: Diagnosis not present

## 2020-02-22 DIAGNOSIS — I10 Essential (primary) hypertension: Secondary | ICD-10-CM | POA: Diagnosis not present

## 2020-02-22 DIAGNOSIS — E039 Hypothyroidism, unspecified: Secondary | ICD-10-CM | POA: Diagnosis not present

## 2020-02-23 ENCOUNTER — Telehealth: Payer: Self-pay | Admitting: Cardiology

## 2020-02-23 NOTE — Telephone Encounter (Signed)
Returned call to [employer] with [facility].Stated they are seeing patient now and wanted to know her cardiac diagnosis.Advised listed in her problem list is paroxymal SVT and hypertension.

## 2020-02-23 NOTE — Telephone Encounter (Signed)
Darlene, the Nurse Manager from [place] at [facility] called asking for the patient's Cardiac Diagnosis. They treat the patient as well and need a Cardiac Diagnosis for their records. The information can be faxed to [place] at [phone]

## 2020-02-24 DIAGNOSIS — E039 Hypothyroidism, unspecified: Secondary | ICD-10-CM | POA: Diagnosis not present

## 2020-02-24 DIAGNOSIS — I10 Essential (primary) hypertension: Secondary | ICD-10-CM | POA: Diagnosis not present

## 2020-02-24 DIAGNOSIS — J449 Chronic obstructive pulmonary disease, unspecified: Secondary | ICD-10-CM | POA: Diagnosis not present

## 2020-02-24 DIAGNOSIS — E785 Hyperlipidemia, unspecified: Secondary | ICD-10-CM | POA: Diagnosis not present

## 2020-02-28 ENCOUNTER — Encounter: Payer: Self-pay | Admitting: Neurology

## 2020-02-28 DIAGNOSIS — E890 Postprocedural hypothyroidism: Secondary | ICD-10-CM | POA: Diagnosis not present

## 2020-02-28 DIAGNOSIS — G4733 Obstructive sleep apnea (adult) (pediatric): Secondary | ICD-10-CM | POA: Diagnosis not present

## 2020-02-28 DIAGNOSIS — U071 COVID-19: Secondary | ICD-10-CM | POA: Diagnosis not present

## 2020-02-28 DIAGNOSIS — G5621 Lesion of ulnar nerve, right upper limb: Secondary | ICD-10-CM | POA: Diagnosis not present

## 2020-02-28 DIAGNOSIS — I2699 Other pulmonary embolism without acute cor pulmonale: Secondary | ICD-10-CM | POA: Diagnosis not present

## 2020-02-28 DIAGNOSIS — J1282 Pneumonia due to coronavirus disease 2019: Secondary | ICD-10-CM | POA: Diagnosis not present

## 2020-02-28 DIAGNOSIS — I10 Essential (primary) hypertension: Secondary | ICD-10-CM | POA: Diagnosis not present

## 2020-02-28 DIAGNOSIS — J9601 Acute respiratory failure with hypoxia: Secondary | ICD-10-CM | POA: Diagnosis not present

## 2020-02-29 DIAGNOSIS — J9601 Acute respiratory failure with hypoxia: Secondary | ICD-10-CM | POA: Diagnosis not present

## 2020-02-29 DIAGNOSIS — J1282 Pneumonia due to coronavirus disease 2019: Secondary | ICD-10-CM | POA: Diagnosis not present

## 2020-02-29 DIAGNOSIS — E890 Postprocedural hypothyroidism: Secondary | ICD-10-CM | POA: Diagnosis not present

## 2020-02-29 DIAGNOSIS — I2699 Other pulmonary embolism without acute cor pulmonale: Secondary | ICD-10-CM | POA: Diagnosis not present

## 2020-02-29 DIAGNOSIS — G5621 Lesion of ulnar nerve, right upper limb: Secondary | ICD-10-CM | POA: Diagnosis not present

## 2020-02-29 DIAGNOSIS — G4733 Obstructive sleep apnea (adult) (pediatric): Secondary | ICD-10-CM | POA: Diagnosis not present

## 2020-02-29 DIAGNOSIS — I10 Essential (primary) hypertension: Secondary | ICD-10-CM | POA: Diagnosis not present

## 2020-02-29 DIAGNOSIS — U071 COVID-19: Secondary | ICD-10-CM | POA: Diagnosis not present

## 2020-03-08 DIAGNOSIS — E890 Postprocedural hypothyroidism: Secondary | ICD-10-CM | POA: Diagnosis not present

## 2020-03-08 DIAGNOSIS — J9601 Acute respiratory failure with hypoxia: Secondary | ICD-10-CM | POA: Diagnosis not present

## 2020-03-08 DIAGNOSIS — J1282 Pneumonia due to coronavirus disease 2019: Secondary | ICD-10-CM | POA: Diagnosis not present

## 2020-03-08 DIAGNOSIS — U071 COVID-19: Secondary | ICD-10-CM | POA: Diagnosis not present

## 2020-03-08 DIAGNOSIS — G4733 Obstructive sleep apnea (adult) (pediatric): Secondary | ICD-10-CM | POA: Diagnosis not present

## 2020-03-08 DIAGNOSIS — I2699 Other pulmonary embolism without acute cor pulmonale: Secondary | ICD-10-CM | POA: Diagnosis not present

## 2020-03-08 DIAGNOSIS — G5621 Lesion of ulnar nerve, right upper limb: Secondary | ICD-10-CM | POA: Diagnosis not present

## 2020-03-08 DIAGNOSIS — I10 Essential (primary) hypertension: Secondary | ICD-10-CM | POA: Diagnosis not present

## 2020-03-10 ENCOUNTER — Other Ambulatory Visit: Payer: Self-pay

## 2020-03-10 ENCOUNTER — Ambulatory Visit: Payer: Medicare HMO

## 2020-03-10 ENCOUNTER — Encounter: Payer: Self-pay | Admitting: Counselor

## 2020-03-10 ENCOUNTER — Ambulatory Visit (INDEPENDENT_AMBULATORY_CARE_PROVIDER_SITE_OTHER): Payer: Medicare HMO | Admitting: Counselor

## 2020-03-10 DIAGNOSIS — Z8616 Personal history of COVID-19: Secondary | ICD-10-CM

## 2020-03-10 DIAGNOSIS — R4189 Other symptoms and signs involving cognitive functions and awareness: Secondary | ICD-10-CM

## 2020-03-10 DIAGNOSIS — F09 Unspecified mental disorder due to known physiological condition: Secondary | ICD-10-CM

## 2020-03-10 NOTE — Progress Notes (Signed)
[facility]  Patient Name: Natasha [name] MRN: [id number] Date of Birth: Jan 21, 1951 Age: 69 y.o. Education: 18 years  Referral Circumstances and Background Information  Ms. Nichelson is a 69 y.o., right-hand dominant, widowed female with a history of HTN, obesity, hypothyroidism (postsurgical), LLE DVT, and bilateral LE edema who had COVID in December, 2020. She presented to the hospital with fever, cough, SOB, with 02 sats in the 80s. CT angiogram showed acute pulmonary emboli, bilateral multifocal infiltration and she was diagnosed with COVID pneumonia and treated with steroids and anticoagulation. She was discharged to a SNF (08/21/2019 - 09/06/2019) for rehab in January and is now back home. She has been doing PT, and is also following with neurology, cardiology, and the postcovid care clinic who referred her here for evaluation. 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    On interview, the patient reported that she is doing somewhat better and continues to slowly improve. She recalls having marked cognitive difficulties when she had COVID, she would sit on the side of her bed, "languishing" and felt like she could not "process" anything. She felt like that continued until March or early April. It sounds like this was a combination of different things including extreme weakness, lack of motivation, she felt like she wanted "someone to slap me" back into reality. She also felt like she was having gaps in her memory in terms of recalling things that she knows. Since then, she is feeling somewhat better, and has been doing more activities but she remains sluggish. I had a hard time getting a clear sense of what exactly the cognitive difficulties she is experiencing are. It seems that in general, she has a sense that doing cognitively demanding things is extremely challenging, like an uphill  battle, similar to how she has felt physically. On detailed review of symptoms, she continues to have some difficulties with memory (her children point it out), although she is beginning to think that some of the conversations she forgets are the ones she doesn't want to hear. She acknowledged feeling slowed down, as though she isn't as quick as in the past. The patient presents as though even walking short differences is extremely challenging for her, she is out of breathe, and in general does not present as physically robust. She has a fair amount of fatigue but despite that, she isn't sleeping well. She was having a hard time going to sleep, then she would have nocturia and not be able to go to sleep. She estimated that she is getting on average 4-5 hours of sleep a night. Sometimes she naps through the day. She is trying to lose weight and is doing noom and has lost about 18lbs. She was vague about her mood and stated that she can't feel sad because she is too blessed, although it is clear that her recent health issues have been a huge challenge for her. She has some lingering physical symptoms including significant body aches and particularly aching in her left leg, numbness in her fingers and toes, bladder incontinence, hair thinning.   With respect to functioning, the patient has a diminished activity level although she is essentially independent and denied there is anything that she used to do that she cannot do now as a result of her COVID. She specifically stated that she is able to remember her medications, make medical appointments, she has started a support  group for people with COVID in the community and is able to participate in that, she is able to cook but doesn't because she is getting premade meals for weight management purposes. She isn't reading much. She is a Probation officer and writes plays and stated that she is able to do that, although it's harder than in the past. She dictates and her writing  partner writes things down. 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    Past Medical History and Review of Relevant Studies   Patient Active Problem List   Diagnosis Date Noted  . Paroxysmal SVT (supraventricular tachycardia) ([place]) 02/10/2020  . History of pulmonary embolism 02/10/2020  . Bilateral leg edema 02/10/2020  . Paresthesia 01/04/2020  . Gait abnormality 01/04/2020  . Weakness 01/04/2020  . History of 2019 novel coronavirus disease (COVID-19) 11/09/2019  . Muscle weakness (generalized) 11/09/2019  . Numbness and tingling of both feet 11/09/2019  . Memory loss due to medical condition 11/09/2019  . Pulmonary nodule 11/09/2019  . Acute respiratory failure with hypoxia ([place]) 08/24/2019  . Obesity, Class III, BMI 40-49.9 (morbid obesity) ([place]) 08/24/2019  . Pulmonary embolism ([place]) 08/23/2019  . COVID-19 virus infection   . Bradycardia   . Acute pulmonary embolism ([place]) 08/21/2019  . Pneumonia due to COVID-19 virus   . OSA (obstructive sleep apnea) 06/15/2013  . ALLERGIC RHINITIS 01/21/2009  . Peripheral edema 12/22/2008  . LEG PAIN, LEFT 12/09/2008  . DISTURBANCE OF SKIN SENSATION 12/05/2008  . NAUSEA AND VOMITING 05/23/2008  . CARCINOMA, THYROID GLAND, PAPILLARY 05/04/2008  . HYPOTHYROIDISM, POSTSURGICAL 05/04/2008  . Hypertension 12/25/2007  . DOE (dyspnea on exertion) 12/25/2007  . COUGH 12/25/2007   Review of Neuroimaging and Relevant Medical History: The patient has an MRI of the brain that was recently obtained at [facility]. Per Dr. Rhea Belton notes, it showed no acute abnormalities, mild small vessel disease, although I was unable to find the images in our PACS or the report in the media section of the chart so I could not review it myself.  She has a  CT head from  09/05/2019 that shows mild involutional changes and no significant areas of leukoaraiosis. Overall, the brain and morphology are normal for her age.   Current Outpatient Medications  Medication Sig Dispense Refill  . albuterol (VENTOLIN HFA) 108 (90 Base) MCG/ACT inhaler Inhale 1-2 puffs into the lungs every 6 (six) hours as needed for wheezing or shortness of breath. 8 g 0  . amLODipine (NORVASC) 5 MG tablet Take 1 tablet (5 mg total) by mouth at bedtime. 30 tablet 0  . Dextromethorphan-Guaifenesin (MUCINEX DM MAXIMUM STRENGTH) 60-1200 MG TB12 Take 1 tablet by mouth every 12 (twelve) hours. 14 each 0  . famotidine (PEPCID) 20 MG tablet One after supper 30 tablet 11  . fexofenadine (ALLEGRA) 180 MG tablet Take 180 mg by mouth daily.    . furosemide (LASIX) 20 MG tablet Take 1 tablet (20 mg total) by mouth 2 (two) times daily. 60 tablet 11  . levothyroxine (SYNTHROID) 150 MCG tablet Take 1 tablet (150 mcg total) by mouth daily. 30 tablet 0  . Multiple Vitamin (MULTIVITAMIN WITH MINERALS) TABS tablet Take 1 tablet by mouth daily.    Marland Kitchen MYRBETRIQ 25 MG TB24 tablet  Take 25 mg by mouth daily.  4  . pantoprazole (PROTONIX) 40 MG tablet Take 1 tablet (40 mg total) by mouth daily. Take 30-60 min before first meal of the day 30 tablet 2  . rivaroxaban (XARELTO) 20 MG TABS tablet Take 1 tablet (20 mg total) by mouth daily with supper. 30 tablet 0   No current facility-administered medications for this visit.   Family History  Problem Relation Age of Onset  . Heart disease Mother   . Kidney disease Mother   . Other Father        unsure of medical history   There is no  family history of dementia. She didn't know her father but her mother lived to 67. She has a younger brother in his 62s who is doing well. There is no  family history of psychiatric illness.  Psychosocial History  Developmental, Educational and Employment History: The patient is a native of [place]. She did well in  school and denied ever being held back or having any learning problems. 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 She earned the masters in Child psychotherapist. 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 She was teaching at [employer] most recently but she has been out the past semester. She teaches [place].   Psychiatric History: The patient has a limited history of counseling, she was involved 2 years ago, it sounds like it was focused on interpersonal relationships and her marriage (her husband passed in 2017).   Substance Use History: The patient doesn't consume alcohol regularly, use any drugs, and has never been a smoker.   Relationship History and Living Cimcumstances: The patient and her husband were married for 40 years. She has three children, one of whom lives in the area and is a Oncologist. Her one son is a Restaurant manager, fast food and the other runs a Printmaker.   Mental Status and Behavioral Observations  Sensorium/Arousal: The patient's level of arousal was awake and alert. Hearing and vision were adequate with correction (used glasses) for testing purposes. Orientation: The patient was fully oriented to person, place, time, and situation.  Appearance: Appeared fatigued and out of breath Behavior: Pleasant, appropriate, able to provide a detailed history Speech/language: Normal in rate, rhythm, volume, with a bit of a breathy quality Gait/Posture: It appeared as though walking and arising from a seated position were an extreme effort for the patient Movement: No overt signs/symptoms of movement disorder noted on observation Social Comportment: Pleasant, appropriate Mood: Not clearly described by the patient, it sounds as though she is ok Affect: Neutral to  euthymic Thought process/content: Thought process was logical, linear, and goal-directed. She had a good grasp of her history and was able to provide a fairly detailed personal timeline.  Safety: No thoughts of harming self or others identified on direct questioning.  Insight: [facility] Cognitive Assessment  03/10/2020  Visuospatial/ Executive (0/5) 5  Naming (0/3) 3  Attention: Read list of digits (0/2) 1  Attention: Read list of letters (0/1) 1  Attention: Serial 7 subtraction starting at 100 (0/3) 3  Language: Repeat phrase (0/2) 2  Language : Fluency (0/1) 1  Abstraction (0/2) 2  Delayed Recall (0/5) 4  Orientation (0/6) 6  Total 28  Adjusted Score (based on education) 28  Test Procedures  Wide Range Achievement Test - 4   Word Reading Wechsler Adult Intelligence Scale - IV  Digit Span  Arithmetic  Symbol Search  Coding Repeatable Battery for the Assessment of Neuropsychological Status (Form A) ACS Word Choice The Dot Counting Test Controlled Oral Word Association (F-A-S) Semantic Fluency (Animals) Trail Making Test A & B Wisconsin Card Sorting Test [id number] Patient Health Questionnaire - 9  GAD-7  Plan  MAKINZIE CONSIDINE was seen for a psychiatric diagnostic evaluation and neuropsychological testing. She had a moderate to severe course of COVID requiring several weeks of hospital admission and developed pneumonia and a large PE. She has since gone back home and has felt a bit in a funk. Physically, she remains with some weakness, it is difficult for her to ambulate very far, and she presents as tired. Her cognitive symptoms are similar and she is also having some difficulty remembering things. She is doing very well on cognitive screening with a MoCA score in the unimpaired range and neuropsychological testing will be helpful to further ascertain any subtle issues. Full and complete note with impressions, recommendations, and interpretation of test data to follow.   Viviano Simas Nicole Kindred, PsyD, [place] Clinical Neuropsychologist  Informed Consent and Coding/Compliance  Risks and benefits of the evaluation were discussed with the patient prior to all testing procedures. I conducted a clinical interview and neuropsychological testing (at least two tests) with [name] and Lamar Benes, B.S. (Technician) assisted me in administering additional test procedures. The patient was able to tolerate the testing procedures and the patient (and/or family if applicable) is likely to benefit from further follow up to receive the diagnosis and treatment recommendations, which will be rendered at the next encounter. Billing below reflects technician time, my direct face-to-face time with the patient, time spent in test administration, and time spent in professional activities including but not limited to: neuropsychological test interpretation, integration of neuropsychological test data with clinical history, report preparation, treatment planning, care coordination, and review of diagnostically pertinent medical history or studies.   Services associated with this encounter: Clinical Interview [phone]) plus 60 minutes (77824; Neuropsychological Evaluation by Professional)  105 minutes (23536; Neuropsychological Evaluation by Professional, Adl.) 22 minutes (14431; Test Administration by Professional) 30 minutes (54008; Neuropsychological Testing by Technician) 90 minutes (67619; Neuropsychological Testing by Technician, Adl.)

## 2020-03-10 NOTE — Progress Notes (Signed)
   Psychometrist Note   Cognitive testing was administered to Natasha [name] by Lamar Benes, B.S. (Technician) under the supervision of Alphonzo Severance, Psy.D., ABN. [name] was able to tolerate all test procedures. Dr. Nicole Kindred met with the patient as needed to manage any emotional reactions to the testing procedures. Rest breaks were offered.    The battery of tests administered was selected by Dr. Nicole Kindred with consideration to the patient's current level of functioning, the nature of her symptoms, emotional and behavioral responses during the interview, level of literacy, observed level of motivation/effort, and the nature of the referral question. This battery was communicated to the psychometrist. Communication between Dr. Nicole Kindred and the psychometrist was ongoing throughout the evaluation and Dr. Nicole Kindred was immediately accessible at all times. Dr. Nicole Kindred provided supervision to the technician on the date of this service, to the extent necessary to assure the quality of all services provided.    [name] will return in approximately one week for an interactive feedback session with Dr. Nicole Kindred, at which time female test performance, clinical impressions, and treatment recommendations will be reviewed in detail. The patient understands she can contact our office should she require our assistance before this time.   A total of 120 minutes of billable time were spent with Natasha [name] by the technician, including test administration and scoring time. Billing for these services is reflected in Dr. Les Pou note.   This note reflects time spent with the psychometrician and does not include test scores, clinical history, or any interpretations made by Dr. Nicole Kindred. The full report will follow in a separate note.

## 2020-03-13 ENCOUNTER — Ambulatory Visit: Payer: Medicare HMO

## 2020-03-13 DIAGNOSIS — I10 Essential (primary) hypertension: Secondary | ICD-10-CM | POA: Diagnosis not present

## 2020-03-13 DIAGNOSIS — G4733 Obstructive sleep apnea (adult) (pediatric): Secondary | ICD-10-CM | POA: Diagnosis not present

## 2020-03-13 DIAGNOSIS — E890 Postprocedural hypothyroidism: Secondary | ICD-10-CM | POA: Diagnosis not present

## 2020-03-13 DIAGNOSIS — J9601 Acute respiratory failure with hypoxia: Secondary | ICD-10-CM | POA: Diagnosis not present

## 2020-03-13 DIAGNOSIS — U071 COVID-19: Secondary | ICD-10-CM | POA: Diagnosis not present

## 2020-03-13 DIAGNOSIS — G5621 Lesion of ulnar nerve, right upper limb: Secondary | ICD-10-CM | POA: Diagnosis not present

## 2020-03-13 DIAGNOSIS — R911 Solitary pulmonary nodule: Secondary | ICD-10-CM | POA: Diagnosis not present

## 2020-03-13 DIAGNOSIS — I2699 Other pulmonary embolism without acute cor pulmonale: Secondary | ICD-10-CM | POA: Diagnosis not present

## 2020-03-14 NOTE — Progress Notes (Signed)
[facility]  Patient Name: Natasha [name] MRN: [id number] Date of Birth: 03/23/1951 Age: 69 y.o. Education: 18 years  Measurement properties of test scores: IQ, Index, and Standard Scores (SS): Mean = 100; Standard Deviation = 15 Scaled Scores (Ss): Mean = 10; Standard Deviation = 3 Z scores (Z): Mean = 0; Standard Deviation = 1 T scores (T); Mean = 50; Standard Deviation = 10  TEST SCORES:    Note: This summary of test scores accompanies the interpretive report and should not be interpreted by unqualified individuals or in isolation without reference to the report. Test scores are relative to age, gender, and educational history as available and appropriate.   Performance Validity        The Dot Counting Test: Raw Descriptor      E-Score 15 Below Expectation  "A" Random Letter Test Errors 0 Within Expectation      Embedded Measures: Raw Descriptor      RBANS Effort Index: 0 Within Expectation      WAIS-IV Reliable Digit Span 9 Within Expectation      WAIS-IV Reliable Digit Span Revised 13 Within Expectation      Expected Functioning        Wide Range Achievement Test (Word Reading): Standard/Scaled Score Percentile       Word Reading 120 91      Reynolds Intellectual Screening Test Standard/T-score Percentile      Guess What 56 73      Odd Item Out 68 68  RIST Index 123 <1      Cognitive Testing        RBANS, Form : Standard/Scaled Score Percentile  Total Score 81 10  Immediate Memory 76 5      List Learning 5 5      Story Memory 6 9  Visuospatial/Constructional 84 14      Figure Copy   (19) 11 63         Judgment of Line Orientation   (9) --- <2  Language 92 30      Picture Naming --- 51-75      Semantic Fluency 7 16  Attention 91 27      Digit Span 11 63      Coding 6 9  Delayed Memory 84 14      List Recall   (4) --- 26-50      List Recognition   (18) --- 10-16      Story Recall   (9) 10 50      Figure Recall    (12) 9 37      Wechsler Adult Intelligence Scale - IV: Standard/Scaled Score Percentile  Working Memory Index 102 55      Digit Span 9 37          Digit Span Forward 9 37          Digit Span Backward 10 50          Digit Span Sequencing 9 37      Arithmetic 12 75  Processing Speed Index 94 34      Symbol Search 9 37      Coding 9 37      Neuropsychological Assessment Battery (Language Module): T-score Percentile      Naming   (30) 55 69      Verbal Fluency: T-score Percentile      Controlled Oral Word Association (F-A-S) 56 73      Semantic Fluency (  Animals) 54 66      Trail Making Test: T-Score Percentile      Part A 48 42      Part B 47 38      Modified Wisconsin Card Sorting Test (MWCST): Standard/T-Score Percentile      Number of Categories Correct 35 <1      Number of Perseverative Errors 43 25      Number of Total Errors 38 12      Percent Perseverative Errors 51 54  Executive Function Composite 82 12      Boston Diagnostic Aphasia Exam: Raw Score Scaled Score      Complex Ideational Material 12 12      Clock Drawing Raw Score Descriptor      Command 10 WNL      Rating Scales         Raw Score Descriptor  Patient Health Questionnaire - 9 6 Mild  GAD-7 9 Mild    Anthony Roland V. Nicole Kindred PsyD, [place] Clinical Neuropsychologist

## 2020-03-16 DIAGNOSIS — E890 Postprocedural hypothyroidism: Secondary | ICD-10-CM | POA: Diagnosis not present

## 2020-03-16 DIAGNOSIS — I2699 Other pulmonary embolism without acute cor pulmonale: Secondary | ICD-10-CM | POA: Diagnosis not present

## 2020-03-16 DIAGNOSIS — J9601 Acute respiratory failure with hypoxia: Secondary | ICD-10-CM | POA: Diagnosis not present

## 2020-03-16 DIAGNOSIS — R911 Solitary pulmonary nodule: Secondary | ICD-10-CM | POA: Diagnosis not present

## 2020-03-16 DIAGNOSIS — G4733 Obstructive sleep apnea (adult) (pediatric): Secondary | ICD-10-CM | POA: Diagnosis not present

## 2020-03-16 DIAGNOSIS — I10 Essential (primary) hypertension: Secondary | ICD-10-CM | POA: Diagnosis not present

## 2020-03-16 DIAGNOSIS — U071 COVID-19: Secondary | ICD-10-CM | POA: Diagnosis not present

## 2020-03-16 DIAGNOSIS — G5621 Lesion of ulnar nerve, right upper limb: Secondary | ICD-10-CM | POA: Diagnosis not present

## 2020-03-16 NOTE — Progress Notes (Signed)
[facility]  Patient Name: Natasha [name] MRN: [id number] Date of Birth: 1951/08/01 Age: 69 y.o. Education: 34 years  Clinical Impressions  Natasha [name] is a 69 y.o., right-hand dominant, widowed woman with a history of COVID requiring hospitalization (02 sats in the 62s) in December, 2020. Since then she has had a slow but steady recovery and continues to have significant fatigue, shortness of breath when ambulating, and she is deconditioned. She has also been having brain fog and cognitive difficulties including problems with mental efficiency, memory, and feeling like it is a struggle to do cognitively complicated things. 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 As per Dr. Rhea Belton notes, her MRI showed mild small vessel disease. I was unable to locate any images for personal review.   Neuropsychological testing shows less than expected performance on measures of verbal memory encoding and there are additional scattered low scores on measures of processing speed and executive function. Despite these low scores, her overall performance is generally within expectation at a domain level (with the exception of immediate recall, which is a bit low). She retained information well across time and is not demonstrating a true memory storage problem. She reported mild levels of anxiety and depressive symptoms.   The findings are nonspecific, but do point to some subtle difficulties with performance consistency, cognitive efficiency, and executive control affecting memory encoding. Her low test scores and subjective sense of cognitive diminishment seem likely related to post-COVID cognitive dysfunction, and they may improve as she continues to convalesce. There may also be a vascular component.   Diagnostic Impressions: Mild Cognitive  Disorder History of COVID-19  Recommendations to be discussed with patient  Your performance and presentation on neuropsychological assessment was suggestive of some mild cognitive difficulties. You didn't demonstrate what I would classify as "impaired" performance in any area, but you did have scattered low scores on measures of processing speed, executive function, and also on measures of verbal memory encoding. You were, nevertheless, able to retain information across time well, which is positive.   The best term for your level of problem is mild cognitive disorder. I use this term in place of the more common "mild cognitive impairment," because mild cognitive impairment typically connotes that one has a prodromal degenerative condition like Alzheimer's. In your case, I do not think you have a prodromal dementia condition but rather, that you have some cognitive interference from your course of COVID-19.   People frequently report cognitive problems following COVID-19 including "brain fog" and difficulties with attention and executive control. There are some studies suggesting that there are measurable deficits in abilities like attention and executive function in individuals post-COVID. In some cases, this may be directly attributable to COVID-19 and it's effects on the body, such as in the setting of encephalitis, cerebrovascular consequences from blood clotting disorders, prolonged hypoxia, or ICU acquired delirium. A high rate of individuals also report such symptoms following COVID-19 even when the disease itself is "mild" and does not result in such consequences. It remains unclear to what extent these difficulties are directly attributable to COVID-19 and it's physiological effects.   In your case, I think it is likely that your cognitive difficulties will continue to improve slowly with improvements in your underlying health status. The body is the temple of your brain, and it is clear  that your  body is still recovering. Do what you can to regain your strength, take care of yourself by getting recommended amounts of sleep, eating healthily, managing underlying conditions like high blood pressure and the like.   There are few things as disruptive to brain functioning as not getting a good night's sleep. For sleep, I recommend against using medications, which can have lingering sedating effects on the brain and rob your brain of restful REM sleep. Instead, consider trying some of the following sleep hygiene recommendations. They may not work at once and may take effort, but the effort you spend is likely to be rewarded with better sleep eventually:  . Stick to a sleep schedule of the same bedtime and wake time even on the weekends, which can help to regulate your body's internal clock so that you fall asleep and stay asleep.  . Practice a relaxing bedtime ritual (conducted away from bright lights) which will help separate your sleep from stimulating activities and prepare your body to fall asleep when you go to bed.  . Avoid naps, especially in the afternoon.  . Evaluate your room and create conditions that will promote sleep such as keeping it cool (between 60 - 67 degrees), quiet, and free from any lights. Consider using blackout curtains, a "white noise" generator, or fan that will help mask any noises that might prevent you from going to sleep or awaken you during the night.  . Sleep on a comfortable mattress and pillows.  . Avoid bright light in the evening and excessive use of portable electronic devices right before bed that may contain light frequencies that can contribute to sleep problems.  . Avoid alcohol, cigarettes, or heavy meals in the evening. If you must eat, consume a light snack 45 minutes before bed.  . Use your bed only for sleep to strengthen the association between your bed and sleep.  . If you can't go to sleep within 30 minutes, go into another room and do something  relaxing until you feel tired. Then, come back and try to go to sleep again for 30 minutes and repeat until sleep is achieved.  . Some people find over the counter melatonin to be helpful for sleep, which you could discuss with a pharmacist or prescribing provider.   Of course, if you feel that your cognition is worsening or does not improve, it would be reasonable for you to undergo reevaluation in one year.   Test Findings  Test scores are summarized in additional documentation associated with this encounter. Test scores are relative to age, gender, and educational history as available and appropriate. There were no concerns about performance validity as all findings fell within normal expectations.   General Intellectual Functioning/Achievement:  Performance on single word reading and on the RIST index both fell at a superior level, which presents as a reasonable standard of comparison for Ms. Ostermiller's cognitive test scores. There was a relevant pattern of intrasubtest variability on the RIST with average performance on the more verbally mediated indicator and superior performance on the visually oriented task.   Attention and Processing Efficiency: Performance on indicators of attention was within normal limits, with an average score on the Working Memory Index of the WAIS-IV. Adequate average range scores were obtained on measures of digit repetition forward, backward, and digit resequencing in ascending order. Mental solving of arithmetical word problems without paper and pencil was also average.   With respect to processing efficiency, Ms. Heyne's performance was variable, but  she did achieve an average score on the overall index from the WAIS-IV. She demonstrated weak unusually low performance on one measure of timed number-symbol coding whereas her performance on another analogous measure was average. Performance was average on a symbol matching to sample test emphasizing efficient visual  matching and efficient visual scanning.   Language: Performance on language testing was within normal limits with average visual object confrontation naming. Generation of words in response to the letters F-A-S was average. Semantic fluency was average in response to the category prompt "animals" and low average in response to the prompt "fruits and vegetables."  Visuospatial Function: Performance on visuospatial and constructional measures was low average, overall, however Ms. Mcmiller did demonstrate an extremely low score on judgment of angular line orientations, the significance of which is not entirely clear given her overall test performance profile and presentation. She performed well on figure copy with a near errorless average range score.   Learning and Memory: Learning and memory measures showed reduced unusually low levels of immediate encoding of verbal information with good retention and recall across time. The findings may be on the basis of diminished cognitive efficiency.   In the verbal realm, Ms. Hermida's immediate recall for a 10-item word list and brief short story was unusually low. Her delayed memory index score was better, in the low average range,due to her good retention of information across time, with average delayed recall of both the word list and short story. Delayed yes/no recognition for items from the list versus false choices was low average.   In the visual realm, delayed recall of a modestly complex geometric figure was average.   Executive Functions: Performance on executive measures was mixed with mainly adequate scores. By contrast, she achieved a low average score on the Executive Function Composite of the [employer], which may be just a bit weak for her. Number of solution categories was unusually low but her perseverative errors score was low average. Clock drawing was within normal limits.   Rating Scale(s): Ms. Servidio reported mild yet  potentially clinically significant levels of depression and anxiety on self-rating scales.   Viviano Simas Nicole Kindred PsyD, [place] Clinical Neuropsychologist

## 2020-03-17 ENCOUNTER — Ambulatory Visit (INDEPENDENT_AMBULATORY_CARE_PROVIDER_SITE_OTHER): Payer: Medicare HMO | Admitting: Internal Medicine

## 2020-03-17 ENCOUNTER — Ambulatory Visit (INDEPENDENT_AMBULATORY_CARE_PROVIDER_SITE_OTHER): Payer: Medicare HMO | Admitting: Counselor

## 2020-03-17 ENCOUNTER — Other Ambulatory Visit: Payer: Self-pay

## 2020-03-17 ENCOUNTER — Encounter: Payer: Self-pay | Admitting: Counselor

## 2020-03-17 ENCOUNTER — Encounter: Payer: Self-pay | Admitting: Internal Medicine

## 2020-03-17 DIAGNOSIS — F09 Unspecified mental disorder due to known physiological condition: Secondary | ICD-10-CM

## 2020-03-17 DIAGNOSIS — R0609 Other forms of dyspnea: Secondary | ICD-10-CM

## 2020-03-17 DIAGNOSIS — G3184 Mild cognitive impairment, so stated: Secondary | ICD-10-CM | POA: Diagnosis not present

## 2020-03-17 DIAGNOSIS — R0600 Dyspnea, unspecified: Secondary | ICD-10-CM | POA: Diagnosis not present

## 2020-03-17 NOTE — Progress Notes (Signed)
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 Follow outpatient CBC/CMP 2. Continue quarantine until 09/08/19.  Needs 24 hours without symptoms without using fever reducing medications to d/c quarantine.  3. Continue xarelto 15 mg BID until 1/12, on 1/13 will start 20 mg daily - continue indefinitely given hx of VTE - needs at least 3 months uninterrupted, then likely indefinitely 4. Follow repeat echo outpatient for low normal RV systolic function 5. Follow with neurology outpatient, consider MRI and EMG/NCS outpatient 6. Follow blood pressure outpatient.  Holding valstartan-HCTZ.  Follow BP to see if this needs to be resumed.  7. Follow with cardiology outpatient for bradycardia 8. Follow TSH outpatient 9. Follow CXR outpatient 10. Follow pulm nodule outpatient   Discharge Diagnoses:  Principal Problem:   Acute pulmonary embolism (HCC)   HYPOTHYROIDISM, POSTSURGICAL   Hypertension   Acute respiratory failure with hypoxia (HCC)   Obesity, Class III, BMI 40-49.9 (morbid obesity) ([place])   History of present illness:  Patient is 69 year old female with history of hypertension, hypothyroidism, history of left lower extremity DVT for which she was treated with Xarelto, decreased mobility with multiple back problems and spinal disease who presented to the emergency room with fever, cough and shortness of breath for about 2 weeks. She was originally diagnosed with COVID-19 on 08/17/2019 at urgent care and treated empirically with  dexamethasone as outpatient as she was not hypoxic. Patient came to the emergency room with saturation of 80%, tachypneic, afebrile. CT angios showed acute pulmonary embolism with moderate clot burden and bilateral multifocal infiltrates. Started on IV heparin and admitted to [place]. Remains in the hospital waiting to go to SNF.  She was admitted for COVID pneumonia.  She was treated with steroids and remdesivir.  She was also noted to have a submassive PE.  She has been started on xarelto.  She noted RUE weakness and numbness as well as RLE numbness.  She's had a hx of c spine injury.  She had negative head CT x 2 for CVA.  Case was discussed with neurology recommending outpatient follow up.  She's improved and stable for discharge on 1/4.  See below for further details regarding hospitalization.  Hospital Course:  Acute respiratory failure with hypoxia, multifactorial in the setting of COVID-19 pneumonia and pulmonary embolism: Respiratory symptoms improving.Continue chest physiotherapy.  Stable today on RA Finished remdesivir therapy for 5 days.  Finished 10 days of steroid therapy.  Mostly improved. Has some pleuritic pain, improved with tramadol.  Acute pulmonary embolism:With history of thromboembolism. Probably aggravated by acute viral infection, however given second episode of thromboembolism and moderate clot burden, patient will likely benefit with lifelong anticoagulation. Initially treated with heparin infusion and now changed to Xarelto.  Echo with low normal RV systolic function -> follow outpatient LE [place] without VTE  Chest Pain: pt describes CP overnight that improved after she got up and walked. Described as pressure. Some CP with palpation. EKG appears similar to prior, isolated T wave  inversion in lead III. Troponin negative x2. ACS unlikely. Possible musculoskeletal or GI? CXR with improved R base aeration, no acute superimposed process. Will  continue to monitor Trial pepcid - symptoms improved with pepcid, sx likely 2/2 reflux  Right arm and shoulder numbness  RUE weakness: present x ~3 days per patient. Numbness to 4th and 5th finger and palm. She also has weakness in her RUE as well and some numbness to sole of R foot. Hx cervical fusion after accident years ago, though she notes that these symptoms are new. Follow MRI brain and C spine - she declined this as is claustrophobic and only does open MRI's.  CT head and neck unrevealing Repeat CT negative - discussed with neurology - as pt declining MRI here, will need to follow up outpatient. Possibly related to previous hx of cervical spine injury? Will need outpatient follow up, possibly nerve conduction study and EMG.   Hypotension: improved.   Sinus bradycardia: Mostly at sleep. She has sleep apnea. Unable to use CPAP in the hospital. Not on any AV nodal blockers. Seen by cardiology. They recommended outpatient follow-up and ambulatory monitor. Cardiology to schedule a follow-up.  Hypertension:BP starting to rise, restart amlodipine. Follow.  Hypothyroidism:Continue Synthroid. Recheck TSH as outpatient.  Sleep apnea: Obstructive sleep apnea. Will use CPAP after discharge home.  Debility:Patient has significant physical debility and aggravated by current infection. She will benefit with inpatient therapies at a skilled nursing rehab.  Procedures: LE [place] Summary: Right: There is no evidence of deep vein thrombosis in the lower extremity. No cystic structure found in the popliteal fossa. Left: There is no evidence of deep vein thrombosis in the lower extremity. No cystic structure found in the popliteal fossa.  Echo IMPRESSIONS   1. Left ventricular ejection fraction, by visual estimation, is 65 to 70%. The left ventricle has hyperdynamic function. There is no left ventricular hypertrophy. 2. The left ventricle has no regional wall motion  abnormalities. 3. Global right ventricle has low normal systolic function.The right ventricular size is mildly enlarged. No increase in right ventricular wall thickness. 4. Presence of pericardial fat pad. 5. The mitral valve is grossly normal. Trivial mitral valve regurgitation. 6. The tricuspid valve is grossly normal. Tricuspid valve regurgitation is not demonstrated. 7. The tricuspid valve was normal in structure. Tricuspid valve regurgitation is not demonstrated. 8. The aortic valve is grossly normal. Aortic valve regurgitation is not visualized. No evidence of aortic valve sclerosis or stenosis. 9. The inferior vena cava is dilated in size with <50% respiratory variability, suggesting right atrial pressure of 15 mmHg. 10. Technically challenging study. Normal LV function. RV appears to be mildly enlarged, with borderline normal function. Unable to visualize PA well. 11. The interatrial septum was not assessed.   Rehab at [facility] in [place] no 02 but not ambulatory >  walking with cane up and down hallways at [facility] which was about 100 feet s 02 left Jan 10 and able to walk driveway with rollator but 4/22 but only one lap where as 3 days prior was able to do 2 laps     History of Present Illness  12/27/2019  Pulmonary/ 1st office eval/Natasha [name] sob worse than prior to [place] 08/2019 Chief Complaint  Patient presents with  . Pulmonary Consult    Referred by Lazaro Arms, NP.  Pt with hx of covid 19 and PE--on Xarelto. Her breathing had been imrproving but has been worse over the past few days. She states she gets SOB walking  approx 20 ft. She is not using her albuterol inhaler.   Dyspnea:  50 ft  Cough: new dry cough pattern over last week prior to OV  Day > noct  Sleep: on R side/ on cpap x 6 months/ 45 degrees hob SABA use: not using / didn't help CP ever since covid anterior well localized midline worse with coughing  rec Pantoprazole (protonix) 40 mg   Take  30-60 min before  first meal of the day and Pepcid (famotidine)  20 mg one @  bedtime until return to office - this is the best way to tell whether stomach acid is contributing to your problem.  GERD diet  Call your cardiologist with your fluid issues  - needs to be much more aggressively managed as may be the cause of your breathing problems if there is a correlation between your severity of  leg swelling and breathing difficulty  Please remember to go to the  x-ray department  for your tests - we will call you with the results when they are available   Please schedule a follow up office visit in 4 weeks, sooner if needed    03/17/2020  f/u ov/Tabithia Stroder re:  Post covid pna But dates Sob since spinal surgery 2019 / s/p second covid shot pfizer 12/06/19   - wt preop 272  Chief Complaint  Patient presents with  . Follow-up  Dyspnea:  Doing therapy doing laps by herself up to 20 min with walker and not using 02  Cough: gone  Sleeping: 45 degrees on side / started cpap back x 5 days and helping  SABA use: not using  02: none but also not tracking sats as rec  Cp gone    No obvious day to day or daytime variability or assoc excess/ purulent sputum or mucus plugs or hemoptysis or cp or chest tightness, subjective wheeze or overt sinus or hb symptoms.   Sob  without nocturnal  or early am exacerbation  of respiratory  c/o's or need for noct saba. Also denies any obvious fluctuation of symptoms with weather or environmental changes or other aggravating or alleviating factors except as outlined above   No unusual exposure hx or h/o childhood pna/ asthma or knowledge of premature birth.  Current Allergies, Complete Past Medical History, Past Surgical History, Family History, and Social History were reviewed in [employer] record.  ROS  The following are not active complaints unless bolded Hoarseness, sore throat, dysphagia, dental problems, itching, sneezing,  nasal congestion or discharge of excess  mucus or purulent secretions, ear ache,   fever, chills, sweats, unintended wt loss or wt gain, classically pleuritic or exertional cp,  orthopnea pnd or arm/hand swelling  or leg swelling, presyncope, palpitations, abdominal pain, anorexia, nausea, vomiting, diarrhea  or change in bowel habits or change in bladder habits, change in stools or change in urine, dysuria, hematuria,  rash, arthralgias, visual complaints, headache, numbness, weakness or ataxia or problems with walking or coordination,  change in mood or  memory.         Current Meds  Medication Sig  . albuterol (VENTOLIN HFA) 108 (90 Base) MCG/ACT inhaler Inhale 1-2 puffs into the lungs every 6 (six) hours as needed for wheezing or shortness of breath.  Marland Kitchen amLODipine (NORVASC) 5 MG tablet Take 1 tablet (5 mg total) by mouth at bedtime.  Marland Kitchen Dextromethorphan-Guaifenesin (MUCINEX DM MAXIMUM STRENGTH) 60-1200 MG TB12 Take 1 tablet by mouth every 12 (twelve) hours.  . famotidine (  PEPCID) 20 MG tablet One after supper  . fexofenadine (ALLEGRA) 180 MG tablet Take 180 mg by mouth daily.  . furosemide (LASIX) 20 MG tablet Take 1 tablet (20 mg total) by mouth 2 (two) times daily.  . Multiple Vitamin (MULTIVITAMIN WITH MINERALS) TABS tablet Take 1 tablet by mouth daily.  Marland Kitchen MYRBETRIQ 25 MG TB24 tablet Take 25 mg by mouth daily.  . pantoprazole (PROTONIX) 40 MG tablet Take 1 tablet (40 mg total) by mouth daily. Take 30-60 min before first meal of the day              Past Medical History:  Diagnosis Date  . 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 Complication of anesthesia    trouble with airway  . Edema 12/22/2008  . HYPERTENSION 12/25/2007  . HYPOTHYROIDISM, POSTSURGICAL 05/04/2008  . LEG PAIN, LEFT 12/09/2008  . OSA (obstructive sleep apnea) 06/15/2013      Objective:     amb  bf nad   03/17/2020            307  12/27/19 (!) 313 lb 6.4 oz (142.2 kg)  11/17/19 (!) 311 lb (141.1 kg)  11/08/19 (!) 319 lb (144.7 kg)    Vital signs reviewed  03/17/2020  - Note at rest 02 sats  100% on RA         HEENT : pt wearing mask not removed for exam due to covid -19 concerns.    NECK :  without JVD/Nodes/TM/ nl carotid upstrokes bilaterally   LUNGS: no acc muscle use,  Nl contour chest which is clear to A and P bilaterally without cough on insp or exp maneuvers   CV:  RRR  no s3 or murmur or increase in P2, and 2+ pitting both LE's  ABD: obese  soft and nontender with nl inspiratory excursion in the supine position. No bruits or organomegaly appreciated, bowel sounds nl  MS:  Nl gait/ ext warm without deformities, calf tenderness, cyanosis or clubbing No obvious joint restrictions   SKIN: warm and dry without lesions    NEURO:  alert, approp, nl sensorium with  no motor or cerebellar deficits apparent.        CXR PA and Lateral:   12/27/2019 :    I personally reviewed images and agree with radiology impression as follows:    No active cardiopulmonary disease.     Assessment

## 2020-03-17 NOTE — Patient Instructions (Addendum)
Keep up the walking daily   Discuss your leg swelling and medications with your heart care provider   Make sure you check your oxygen saturations at highest level of activity to be sure it stays over 90% and call if losing ground with your exercise tolerance or oxygen level    If you are satisfied with your treatment plan,  let your doctor know and he/she can either refill your medications or you can return here when your prescription runs out.     If in any way you are not 100% satisfied,  please tell us.  If 100% better, tell your friends!  Pulmonary follow up is as needed

## 2020-03-17 NOTE — Patient Instructions (Signed)
Your performance and presentation on neuropsychological assessment was suggestive of some mild cognitive difficulties. You didn't demonstrate what I would classify as "impaired" performance in any area, but you did have scattered low scores on measures of processing speed, executive function, and also on measures of verbal memory encoding. You were, nevertheless, able to retain information across time well, which is positive.   The best term for your level of problem is mild cognitive disorder. I use this term in place of the more common "mild cognitive impairment," because mild cognitive impairment typically connotes that one has a prodromal degenerative condition like Alzheimer's. In your case, I do not think you have a prodromal dementia condition but rather, that you have some cognitive interference from your course of COVID-19.   People frequently report cognitive problems following COVID-19 including "brain fog" and difficulties with attention and executive control. There are some studies suggesting that there are measurable deficits in abilities like attention and executive function in individuals post-COVID. In some cases, this may be directly attributable to COVID-19 and it's effects on the body, such as in the setting of encephalitis, cerebrovascular consequences from blood clotting disorders, prolonged hypoxia, or ICU acquired delirium. A high rate of individuals also report such symptoms following COVID-19 even when the disease itself is "mild" and does not result in such consequences. It remains unclear to what extent these difficulties are directly attributable to COVID-19 and it's physiological effects.   In your case, I think it is likely that your cognitive difficulties will continue to improve slowly with improvements in your underlying health status. The body is the temple of your brain, and it is clear that your body is still recovering. Do what you can to regain your strength, take care  of yourself by getting recommended amounts of sleep, eating healthily, managing underlying conditions like high blood pressure and the like.   There are few things as disruptive to brain functioning as not getting a good night's sleep. For sleep, I recommend against using medications, which can have lingering sedating effects on the brain and rob your brain of restful REM sleep. Instead, consider trying some of the following sleep hygiene recommendations. They may not work at once and may take effort, but the effort you spend is likely to be rewarded with better sleep eventually:   Stick to a sleep schedule of the same bedtime and wake time even on the weekends, which can help to regulate your body's internal clock so that you fall asleep and stay asleep.   Practice a relaxing bedtime ritual (conducted away from bright lights) which will help separate your sleep from stimulating activities and prepare your body to fall asleep when you go to bed.   Avoid naps, especially in the afternoon.   Evaluate your room and create conditions that will promote sleep such as keeping it cool (between 60 - 67 degrees), quiet, and free from any lights. Consider using blackout curtains, a "white noise" generator, or fan that will help mask any noises that might prevent you from going to sleep or awaken you during the night.   Sleep on a comfortable mattress and pillows.   Avoid bright light in the evening and excessive use of portable electronic devices right before bed that may contain light frequencies that can contribute to sleep problems.   Avoid alcohol, cigarettes, or heavy meals in the evening. If you must eat, consume a light snack 45 minutes before bed.   Use your bed only for sleep  to strengthen the association between your bed and sleep.   If you can't go to sleep within 30 minutes, go into another room and do something relaxing until you feel tired. Then, come back and try to go to sleep again for  30 minutes and repeat until sleep is achieved.   Some people find over the counter melatonin to be helpful for sleep, which you could discuss with a pharmacist or prescribing provider.   Of course, if you feel that your cognition is worsening or does not improve, it would be reasonable for you to undergo reevaluation in one year.   Follow up with your PCP regarding urinary symptoms.   We discussed the importance of using your CPAP regularly, ideally so it becomes part of your nightly routine. Often times comfort of the device improves over time.

## 2020-03-17 NOTE — Progress Notes (Signed)
[facility]  Telemedicine statement:  I discussed the limitations of neuropsychological care via telemedicine and the availability of in person appointments. The patient expressed understanding and agreed to proceed. The patient was verified with two identifiers.  The visit modality was: telephonic The patient location was: home The provider location was: office  The following individuals participated: Natasha [name]  Feedback Note: I met with Natasha [name] to review the findings resulting from her neuropsychological evaluation. Since the last appointment, she has been having some urinary issues. Given her symptoms (burning, frequent urination, etc.) I suggested she contact her PCP to make sure she does not have a UTI. She has had them in the past. Time was spent reviewing the impressions and recommendations that are detailed in the evaluation report. We discussed impression of some mild cognitive diminishment s/p COVID, that can potentially improve. I reinforced the importance of using her CPAP, getting adequate sleep, and getting more exercise to rebuild her strength. She admits that is a challenge for her. She presented as reassured by the findings and there is no concern for neurodegeneration or a progressive cause of cognitive decline in her case.  Current Medications and Medical History   Current Outpatient Medications  Medication Sig Dispense Refill  . albuterol (VENTOLIN HFA) 108 (90 Base) MCG/ACT inhaler Inhale 1-2 puffs into the lungs every 6 (six) hours as needed for wheezing or shortness of breath. 8 g 0  . amLODipine (NORVASC) 5 MG tablet Take 1 tablet (5 mg total) by mouth at bedtime. 30 tablet 0  . Dextromethorphan-Guaifenesin (MUCINEX DM MAXIMUM STRENGTH) 60-1200 MG TB12 Take 1 tablet by mouth every 12 (twelve) hours. 14 each 0  . famotidine (PEPCID) 20 MG tablet One after supper 30 tablet 11  . fexofenadine (ALLEGRA) 180 MG tablet Take  180 mg by mouth daily.    . furosemide (LASIX) 20 MG tablet Take 1 tablet (20 mg total) by mouth 2 (two) times daily. 60 tablet 11  . levothyroxine (SYNTHROID) 150 MCG tablet Take 1 tablet (150 mcg total) by mouth daily. 30 tablet 0  . Multiple Vitamin (MULTIVITAMIN WITH MINERALS) TABS tablet Take 1 tablet by mouth daily.    Marland Kitchen MYRBETRIQ 25 MG TB24 tablet Take 25 mg by mouth daily.  4  . pantoprazole (PROTONIX) 40 MG tablet Take 1 tablet (40 mg total) by mouth daily. Take 30-60 min before first meal of the day 30 tablet 2  . rivaroxaban (XARELTO) 20 MG TABS tablet Take 1 tablet (20 mg total) by mouth daily with supper. 30 tablet 0   No current facility-administered medications for this visit.    Patient Active Problem List   Diagnosis Date Noted  . Paroxysmal SVT (supraventricular tachycardia) ([place]) 02/10/2020  . History of pulmonary embolism 02/10/2020  . Bilateral leg edema 02/10/2020  . Paresthesia 01/04/2020  . Gait abnormality 01/04/2020  . Weakness 01/04/2020  . History of 2019 novel coronavirus disease (COVID-19) 11/09/2019  . Muscle weakness (generalized) 11/09/2019  . Numbness and tingling of both feet 11/09/2019  . Memory loss due to medical condition 11/09/2019  . Pulmonary nodule 11/09/2019  . Acute respiratory failure with hypoxia ([place]) 08/24/2019  . Obesity, Class III, BMI 40-49.9 (morbid obesity) ([place]) 08/24/2019  . Pulmonary embolism ([place]) 08/23/2019  . COVID-19 virus infection   . Bradycardia   . Acute pulmonary embolism ([place]) 08/21/2019  . Pneumonia due to COVID-19 virus   . OSA (obstructive sleep apnea) 06/15/2013  . ALLERGIC RHINITIS  01/21/2009  . Peripheral edema 12/22/2008  . LEG PAIN, LEFT 12/09/2008  . DISTURBANCE OF SKIN SENSATION 12/05/2008  . NAUSEA AND VOMITING 05/23/2008  . CARCINOMA, THYROID GLAND, PAPILLARY 05/04/2008  . HYPOTHYROIDISM, POSTSURGICAL 05/04/2008  . Hypertension 12/25/2007  . DOE (dyspnea on exertion) 12/25/2007  . COUGH 12/25/2007      Mental Status and Behavioral Observations  Natasha [name] was available at the prespecified time for this telephonic appointment and was alert and generally oriented (orientation not formally assessed). Speech was normal in rate, rhythm, volume, and prosody. Self-reported mood was "allright" and affect as assessed by vocal quality was neutral. Thought process was logical and thought content was appropriate. There were no safety concerns identified at today's encounter, such as thoughts of harming self or others.   Plan  Feedback provided regarding the patient's neuropsychological evaluation. She has fairly minimal cognitive difficulties, presenting with mainly memory encoding issues. Natasha [name] was encouraged to contact me if any questions arise or if further follow up is desired.   Viviano Simas Nicole Kindred, PsyD, ABN Clinical Neuropsychologist  Service(s) Provided at This Encounter: 35 minutes [phone]; Therapy with patient)

## 2020-03-18 ENCOUNTER — Encounter: Payer: Self-pay | Admitting: Internal Medicine

## 2020-03-18 NOTE — Assessment & Plan Note (Addendum)
Body mass index is 48.18 kg/m.  -  trending  Down/ encourage  Lab Results  Component Value Date   TSH 0.288 (L) 09/02/2019     Contributing to gerd/ PE  risk/ doe/reviewed the need and the process to achieve and maintain neg calorie balance > defer f/u primary care including intermittently monitoring thyroid status    >>> pulmonary f/u is prn          Each maintenance medication was reviewed in detail including emphasizing most importantly the difference between maintenance and prns and under what circumstances the prns are to be triggered using an action plan format where appropriate.  Total time for H and P, chart review, counseling,   and generating customized AVS unique to this office visit / charting = 20 min

## 2020-03-18 NOTE — Assessment & Plan Note (Signed)
Eval by Gwenette Greet 2009 with findings of obesity/ UACS  - worse than baseline since covid 19 / PE moderate clot burden  08/2019 with neg venous dopplers bilaterally  Echo 12/22/19  1. Left ventricular ejection fraction, by estimation, is 60 to 65%. The  left ventricle has normal function. The left ventricle has no regional  wall motion abnormalities. LV  diastolic parameters are indeterminate.  2. Right ventricular systolic function is normal. The right ventricular  size is not well visualized. Tricuspid regurgitation signal is inadequate  for assessing PA pressure.  3. The mitral valve is grossly normal. Trivial mitral valve  regurgitation. No evidence of mitral stenosis.  4. The aortic valve was not well visualized. Aortic valve regurgitation  is not visualized. No aortic stenosis is present.  5. The inferior vena cava is normal in size with greater than 50%  respiratory variability, suggesting right atrial pressure of 3 mmHg. - 12/27/2019   Walked RA x one lap =  approx 250 ft -@ slow/rollator  pace stopped due to fatigue >> sob with sats 98% RA   Gradually improving on no pulmonary meds/ still issues with retains fluid ? Partly related to use of amlodipine > advised to discuss with cards

## 2020-03-18 NOTE — Assessment & Plan Note (Signed)
>>  ASSESSMENT AND PLAN FOR DYSPNEA WRITTEN ON 03/18/2020  4:10 PM BY DARLEAN OZELL NOVAK, MD  Eval by Corrie MAIN with findings of obesity/ UACS  - worse than baseline since covid 19 / PE moderate clot burden  08/2019 with neg venous dopplers bilaterally  Echo 12/22/19  1. Left ventricular ejection fraction, by estimation, is 60 to 65%. The  left ventricle has normal function. The left ventricle has no regional  wall motion abnormalities. LV  diastolic parameters are indeterminate.  2. Right ventricular systolic function is normal. The right ventricular  size is not well visualized. Tricuspid regurgitation signal is inadequate  for assessing PA pressure.  3. The mitral valve is grossly normal. Trivial mitral valve  regurgitation. No evidence of mitral stenosis.  4. The aortic valve was not well visualized. Aortic valve regurgitation  is not visualized. No aortic stenosis is present.  5. The inferior vena cava is normal in size with greater than 50%  respiratory variability, suggesting right atrial pressure of 3 mmHg. - 12/27/2019   Walked RA x one lap =  approx 250 ft -@ slow/rollator  pace stopped due to fatigue >> sob with sats 98% RA   Gradually improving on no pulmonary meds/ still issues with retains fluid ? Partly related to use of amlodipine  > advised to discuss with cards

## 2020-03-19 ENCOUNTER — Other Ambulatory Visit: Payer: Self-pay | Admitting: Internal Medicine

## 2020-03-22 DIAGNOSIS — G5621 Lesion of ulnar nerve, right upper limb: Secondary | ICD-10-CM | POA: Diagnosis not present

## 2020-03-22 DIAGNOSIS — R911 Solitary pulmonary nodule: Secondary | ICD-10-CM | POA: Diagnosis not present

## 2020-03-22 DIAGNOSIS — E890 Postprocedural hypothyroidism: Secondary | ICD-10-CM | POA: Diagnosis not present

## 2020-03-22 DIAGNOSIS — U071 COVID-19: Secondary | ICD-10-CM | POA: Diagnosis not present

## 2020-03-22 DIAGNOSIS — J9601 Acute respiratory failure with hypoxia: Secondary | ICD-10-CM | POA: Diagnosis not present

## 2020-03-22 DIAGNOSIS — I2699 Other pulmonary embolism without acute cor pulmonale: Secondary | ICD-10-CM | POA: Diagnosis not present

## 2020-03-22 DIAGNOSIS — G4733 Obstructive sleep apnea (adult) (pediatric): Secondary | ICD-10-CM | POA: Diagnosis not present

## 2020-03-22 DIAGNOSIS — I10 Essential (primary) hypertension: Secondary | ICD-10-CM | POA: Diagnosis not present

## 2020-03-31 DIAGNOSIS — G5621 Lesion of ulnar nerve, right upper limb: Secondary | ICD-10-CM | POA: Diagnosis not present

## 2020-03-31 DIAGNOSIS — I10 Essential (primary) hypertension: Secondary | ICD-10-CM | POA: Diagnosis not present

## 2020-03-31 DIAGNOSIS — E890 Postprocedural hypothyroidism: Secondary | ICD-10-CM | POA: Diagnosis not present

## 2020-03-31 DIAGNOSIS — U071 COVID-19: Secondary | ICD-10-CM | POA: Diagnosis not present

## 2020-03-31 DIAGNOSIS — J9601 Acute respiratory failure with hypoxia: Secondary | ICD-10-CM | POA: Diagnosis not present

## 2020-03-31 DIAGNOSIS — G4733 Obstructive sleep apnea (adult) (pediatric): Secondary | ICD-10-CM | POA: Diagnosis not present

## 2020-03-31 DIAGNOSIS — R911 Solitary pulmonary nodule: Secondary | ICD-10-CM | POA: Diagnosis not present

## 2020-03-31 DIAGNOSIS — I2699 Other pulmonary embolism without acute cor pulmonale: Secondary | ICD-10-CM | POA: Diagnosis not present

## 2020-04-10 ENCOUNTER — Encounter: Payer: Self-pay | Admitting: Cardiology

## 2020-04-12 DIAGNOSIS — I10 Essential (primary) hypertension: Secondary | ICD-10-CM | POA: Diagnosis not present

## 2020-04-12 DIAGNOSIS — I2699 Other pulmonary embolism without acute cor pulmonale: Secondary | ICD-10-CM | POA: Diagnosis not present

## 2020-04-12 DIAGNOSIS — G4733 Obstructive sleep apnea (adult) (pediatric): Secondary | ICD-10-CM | POA: Diagnosis not present

## 2020-04-12 DIAGNOSIS — R911 Solitary pulmonary nodule: Secondary | ICD-10-CM | POA: Diagnosis not present

## 2020-04-12 DIAGNOSIS — G5621 Lesion of ulnar nerve, right upper limb: Secondary | ICD-10-CM | POA: Diagnosis not present

## 2020-04-12 DIAGNOSIS — U071 COVID-19: Secondary | ICD-10-CM | POA: Diagnosis not present

## 2020-04-12 DIAGNOSIS — J9601 Acute respiratory failure with hypoxia: Secondary | ICD-10-CM | POA: Diagnosis not present

## 2020-04-12 DIAGNOSIS — E890 Postprocedural hypothyroidism: Secondary | ICD-10-CM | POA: Diagnosis not present

## 2020-04-14 DIAGNOSIS — G4733 Obstructive sleep apnea (adult) (pediatric): Secondary | ICD-10-CM | POA: Diagnosis not present

## 2020-04-14 DIAGNOSIS — J9601 Acute respiratory failure with hypoxia: Secondary | ICD-10-CM | POA: Diagnosis not present

## 2020-04-14 DIAGNOSIS — G5621 Lesion of ulnar nerve, right upper limb: Secondary | ICD-10-CM | POA: Diagnosis not present

## 2020-04-14 DIAGNOSIS — R911 Solitary pulmonary nodule: Secondary | ICD-10-CM | POA: Diagnosis not present

## 2020-04-14 DIAGNOSIS — E890 Postprocedural hypothyroidism: Secondary | ICD-10-CM | POA: Diagnosis not present

## 2020-04-14 DIAGNOSIS — U071 COVID-19: Secondary | ICD-10-CM | POA: Diagnosis not present

## 2020-04-14 DIAGNOSIS — I2699 Other pulmonary embolism without acute cor pulmonale: Secondary | ICD-10-CM | POA: Diagnosis not present

## 2020-04-14 DIAGNOSIS — I10 Essential (primary) hypertension: Secondary | ICD-10-CM | POA: Diagnosis not present

## 2020-04-19 DIAGNOSIS — N3941 Urge incontinence: Secondary | ICD-10-CM | POA: Diagnosis not present

## 2020-04-19 DIAGNOSIS — N3000 Acute cystitis without hematuria: Secondary | ICD-10-CM | POA: Diagnosis not present

## 2020-04-21 DIAGNOSIS — G5621 Lesion of ulnar nerve, right upper limb: Secondary | ICD-10-CM | POA: Diagnosis not present

## 2020-04-21 DIAGNOSIS — R911 Solitary pulmonary nodule: Secondary | ICD-10-CM | POA: Diagnosis not present

## 2020-04-21 DIAGNOSIS — G4733 Obstructive sleep apnea (adult) (pediatric): Secondary | ICD-10-CM | POA: Diagnosis not present

## 2020-04-21 DIAGNOSIS — I10 Essential (primary) hypertension: Secondary | ICD-10-CM | POA: Diagnosis not present

## 2020-04-21 DIAGNOSIS — J9601 Acute respiratory failure with hypoxia: Secondary | ICD-10-CM | POA: Diagnosis not present

## 2020-04-21 DIAGNOSIS — U071 COVID-19: Secondary | ICD-10-CM | POA: Diagnosis not present

## 2020-04-21 DIAGNOSIS — I2699 Other pulmonary embolism without acute cor pulmonale: Secondary | ICD-10-CM | POA: Diagnosis not present

## 2020-04-21 DIAGNOSIS — E890 Postprocedural hypothyroidism: Secondary | ICD-10-CM | POA: Diagnosis not present

## 2020-05-02 DIAGNOSIS — R911 Solitary pulmonary nodule: Secondary | ICD-10-CM | POA: Diagnosis not present

## 2020-05-02 DIAGNOSIS — E890 Postprocedural hypothyroidism: Secondary | ICD-10-CM | POA: Diagnosis not present

## 2020-05-02 DIAGNOSIS — I10 Essential (primary) hypertension: Secondary | ICD-10-CM | POA: Diagnosis not present

## 2020-05-02 DIAGNOSIS — I2699 Other pulmonary embolism without acute cor pulmonale: Secondary | ICD-10-CM | POA: Diagnosis not present

## 2020-05-02 DIAGNOSIS — G4733 Obstructive sleep apnea (adult) (pediatric): Secondary | ICD-10-CM | POA: Diagnosis not present

## 2020-05-02 DIAGNOSIS — U071 COVID-19: Secondary | ICD-10-CM | POA: Diagnosis not present

## 2020-05-02 DIAGNOSIS — G5621 Lesion of ulnar nerve, right upper limb: Secondary | ICD-10-CM | POA: Diagnosis not present

## 2020-05-02 DIAGNOSIS — J9601 Acute respiratory failure with hypoxia: Secondary | ICD-10-CM | POA: Diagnosis not present

## 2020-05-10 DIAGNOSIS — I10 Essential (primary) hypertension: Secondary | ICD-10-CM | POA: Diagnosis not present

## 2020-05-10 DIAGNOSIS — R911 Solitary pulmonary nodule: Secondary | ICD-10-CM | POA: Diagnosis not present

## 2020-05-10 DIAGNOSIS — G4733 Obstructive sleep apnea (adult) (pediatric): Secondary | ICD-10-CM | POA: Diagnosis not present

## 2020-05-10 DIAGNOSIS — I2699 Other pulmonary embolism without acute cor pulmonale: Secondary | ICD-10-CM | POA: Diagnosis not present

## 2020-05-10 DIAGNOSIS — G5621 Lesion of ulnar nerve, right upper limb: Secondary | ICD-10-CM | POA: Diagnosis not present

## 2020-05-10 DIAGNOSIS — E890 Postprocedural hypothyroidism: Secondary | ICD-10-CM | POA: Diagnosis not present

## 2020-05-10 DIAGNOSIS — J9601 Acute respiratory failure with hypoxia: Secondary | ICD-10-CM | POA: Diagnosis not present

## 2020-05-10 DIAGNOSIS — U071 COVID-19: Secondary | ICD-10-CM | POA: Diagnosis not present

## 2020-05-16 DIAGNOSIS — N3941 Urge incontinence: Secondary | ICD-10-CM | POA: Diagnosis not present

## 2020-05-16 DIAGNOSIS — N3000 Acute cystitis without hematuria: Secondary | ICD-10-CM | POA: Diagnosis not present

## 2020-05-16 DIAGNOSIS — R300 Dysuria: Secondary | ICD-10-CM | POA: Diagnosis not present

## 2020-05-16 DIAGNOSIS — M545 Low back pain: Secondary | ICD-10-CM | POA: Diagnosis not present

## 2020-05-16 DIAGNOSIS — R1030 Lower abdominal pain, unspecified: Secondary | ICD-10-CM | POA: Diagnosis not present

## 2020-05-24 DIAGNOSIS — R1030 Lower abdominal pain, unspecified: Secondary | ICD-10-CM | POA: Diagnosis not present

## 2020-05-26 DIAGNOSIS — E038 Other specified hypothyroidism: Secondary | ICD-10-CM | POA: Diagnosis not present

## 2020-05-26 DIAGNOSIS — E782 Mixed hyperlipidemia: Secondary | ICD-10-CM | POA: Diagnosis not present

## 2020-05-26 DIAGNOSIS — E1169 Type 2 diabetes mellitus with other specified complication: Secondary | ICD-10-CM | POA: Diagnosis not present

## 2020-05-26 DIAGNOSIS — K5710 Diverticulosis of small intestine without perforation or abscess without bleeding: Secondary | ICD-10-CM | POA: Diagnosis not present

## 2020-05-26 DIAGNOSIS — M3581 Multisystem inflammatory syndrome: Secondary | ICD-10-CM | POA: Diagnosis not present

## 2020-06-01 DIAGNOSIS — I10 Essential (primary) hypertension: Secondary | ICD-10-CM | POA: Diagnosis not present

## 2020-06-01 DIAGNOSIS — E7849 Other hyperlipidemia: Secondary | ICD-10-CM | POA: Diagnosis not present

## 2020-06-01 DIAGNOSIS — E039 Hypothyroidism, unspecified: Secondary | ICD-10-CM | POA: Diagnosis not present

## 2020-06-09 DIAGNOSIS — I10 Essential (primary) hypertension: Secondary | ICD-10-CM | POA: Diagnosis not present

## 2020-06-09 DIAGNOSIS — E7849 Other hyperlipidemia: Secondary | ICD-10-CM | POA: Diagnosis not present

## 2020-06-09 DIAGNOSIS — E039 Hypothyroidism, unspecified: Secondary | ICD-10-CM | POA: Diagnosis not present

## 2020-06-16 DIAGNOSIS — R531 Weakness: Secondary | ICD-10-CM | POA: Diagnosis not present

## 2020-06-16 DIAGNOSIS — M3581 Multisystem inflammatory syndrome: Secondary | ICD-10-CM | POA: Diagnosis not present

## 2020-06-16 DIAGNOSIS — M545 Low back pain, unspecified: Secondary | ICD-10-CM | POA: Diagnosis not present

## 2020-06-23 DIAGNOSIS — Z1211 Encounter for screening for malignant neoplasm of colon: Secondary | ICD-10-CM | POA: Diagnosis not present

## 2020-06-23 DIAGNOSIS — R109 Unspecified abdominal pain: Secondary | ICD-10-CM | POA: Diagnosis not present

## 2020-06-23 DIAGNOSIS — G8929 Other chronic pain: Secondary | ICD-10-CM | POA: Diagnosis not present

## 2020-06-26 DIAGNOSIS — M545 Low back pain, unspecified: Secondary | ICD-10-CM | POA: Diagnosis not present

## 2020-06-26 DIAGNOSIS — R531 Weakness: Secondary | ICD-10-CM | POA: Diagnosis not present

## 2020-06-26 DIAGNOSIS — M3581 Multisystem inflammatory syndrome: Secondary | ICD-10-CM | POA: Diagnosis not present

## 2020-06-28 DIAGNOSIS — M3581 Multisystem inflammatory syndrome: Secondary | ICD-10-CM | POA: Diagnosis not present

## 2020-06-28 DIAGNOSIS — R531 Weakness: Secondary | ICD-10-CM | POA: Diagnosis not present

## 2020-06-28 DIAGNOSIS — M545 Low back pain, unspecified: Secondary | ICD-10-CM | POA: Diagnosis not present

## 2020-07-01 DIAGNOSIS — E039 Hypothyroidism, unspecified: Secondary | ICD-10-CM | POA: Diagnosis not present

## 2020-07-01 DIAGNOSIS — E7849 Other hyperlipidemia: Secondary | ICD-10-CM | POA: Diagnosis not present

## 2020-07-01 DIAGNOSIS — H43393 Other vitreous opacities, bilateral: Secondary | ICD-10-CM | POA: Diagnosis not present

## 2020-07-01 DIAGNOSIS — Z0100 Encounter for examination of eyes and vision without abnormal findings: Secondary | ICD-10-CM | POA: Diagnosis not present

## 2020-07-01 DIAGNOSIS — I10 Essential (primary) hypertension: Secondary | ICD-10-CM | POA: Diagnosis not present

## 2020-07-03 DIAGNOSIS — M545 Low back pain, unspecified: Secondary | ICD-10-CM | POA: Diagnosis not present

## 2020-07-03 DIAGNOSIS — M3581 Multisystem inflammatory syndrome: Secondary | ICD-10-CM | POA: Diagnosis not present

## 2020-07-03 DIAGNOSIS — R531 Weakness: Secondary | ICD-10-CM | POA: Diagnosis not present

## 2020-07-12 DIAGNOSIS — R531 Weakness: Secondary | ICD-10-CM | POA: Diagnosis not present

## 2020-07-12 DIAGNOSIS — M545 Low back pain, unspecified: Secondary | ICD-10-CM | POA: Diagnosis not present

## 2020-07-12 DIAGNOSIS — M3581 Multisystem inflammatory syndrome: Secondary | ICD-10-CM | POA: Diagnosis not present

## 2020-07-17 DIAGNOSIS — M545 Low back pain, unspecified: Secondary | ICD-10-CM | POA: Diagnosis not present

## 2020-07-17 DIAGNOSIS — R531 Weakness: Secondary | ICD-10-CM | POA: Diagnosis not present

## 2020-07-17 DIAGNOSIS — M3581 Multisystem inflammatory syndrome: Secondary | ICD-10-CM | POA: Diagnosis not present

## 2020-07-18 ENCOUNTER — Other Ambulatory Visit: Payer: Self-pay | Admitting: Internal Medicine

## 2020-07-19 DIAGNOSIS — M545 Low back pain, unspecified: Secondary | ICD-10-CM | POA: Diagnosis not present

## 2020-07-19 DIAGNOSIS — M3581 Multisystem inflammatory syndrome: Secondary | ICD-10-CM | POA: Diagnosis not present

## 2020-07-19 DIAGNOSIS — R531 Weakness: Secondary | ICD-10-CM | POA: Diagnosis not present

## 2020-08-01 DIAGNOSIS — E7849 Other hyperlipidemia: Secondary | ICD-10-CM | POA: Diagnosis not present

## 2020-08-01 DIAGNOSIS — E1169 Type 2 diabetes mellitus with other specified complication: Secondary | ICD-10-CM | POA: Diagnosis not present

## 2020-08-01 DIAGNOSIS — I10 Essential (primary) hypertension: Secondary | ICD-10-CM | POA: Diagnosis not present

## 2020-08-01 DIAGNOSIS — E039 Hypothyroidism, unspecified: Secondary | ICD-10-CM | POA: Diagnosis not present

## 2020-08-03 DIAGNOSIS — M545 Low back pain, unspecified: Secondary | ICD-10-CM | POA: Diagnosis not present

## 2020-08-03 DIAGNOSIS — M3581 Multisystem inflammatory syndrome: Secondary | ICD-10-CM | POA: Diagnosis not present

## 2020-08-03 DIAGNOSIS — R531 Weakness: Secondary | ICD-10-CM | POA: Diagnosis not present

## 2020-08-08 ENCOUNTER — Ambulatory Visit: Payer: Medicare HMO | Admitting: Cardiology

## 2020-08-08 DIAGNOSIS — R531 Weakness: Secondary | ICD-10-CM | POA: Diagnosis not present

## 2020-08-08 DIAGNOSIS — M3581 Multisystem inflammatory syndrome: Secondary | ICD-10-CM | POA: Diagnosis not present

## 2020-08-08 DIAGNOSIS — M545 Low back pain, unspecified: Secondary | ICD-10-CM | POA: Diagnosis not present

## 2020-08-10 DIAGNOSIS — M3581 Multisystem inflammatory syndrome: Secondary | ICD-10-CM | POA: Diagnosis not present

## 2020-08-10 DIAGNOSIS — R531 Weakness: Secondary | ICD-10-CM | POA: Diagnosis not present

## 2020-08-10 DIAGNOSIS — M545 Low back pain, unspecified: Secondary | ICD-10-CM | POA: Diagnosis not present

## 2020-08-19 DIAGNOSIS — Z03818 Encounter for observation for suspected exposure to other biological agents ruled out: Secondary | ICD-10-CM | POA: Diagnosis not present

## 2020-08-19 DIAGNOSIS — Z20822 Contact with and (suspected) exposure to covid-19: Secondary | ICD-10-CM | POA: Diagnosis not present

## 2020-08-24 DIAGNOSIS — Z20828 Contact with and (suspected) exposure to other viral communicable diseases: Secondary | ICD-10-CM | POA: Diagnosis not present

## 2020-09-07 DIAGNOSIS — G4734 Idiopathic sleep related nonobstructive alveolar hypoventilation: Secondary | ICD-10-CM | POA: Diagnosis not present

## 2020-09-07 DIAGNOSIS — R601 Generalized edema: Secondary | ICD-10-CM | POA: Diagnosis not present

## 2020-09-07 DIAGNOSIS — E782 Mixed hyperlipidemia: Secondary | ICD-10-CM | POA: Diagnosis not present

## 2020-09-07 DIAGNOSIS — M3581 Multisystem inflammatory syndrome: Secondary | ICD-10-CM | POA: Diagnosis not present

## 2020-09-07 DIAGNOSIS — E038 Other specified hypothyroidism: Secondary | ICD-10-CM | POA: Diagnosis not present

## 2020-09-07 DIAGNOSIS — G4739 Other sleep apnea: Secondary | ICD-10-CM | POA: Diagnosis not present

## 2020-09-07 DIAGNOSIS — F330 Major depressive disorder, recurrent, mild: Secondary | ICD-10-CM | POA: Diagnosis not present

## 2020-09-07 DIAGNOSIS — E1169 Type 2 diabetes mellitus with other specified complication: Secondary | ICD-10-CM | POA: Diagnosis not present

## 2020-09-07 DIAGNOSIS — I10 Essential (primary) hypertension: Secondary | ICD-10-CM | POA: Diagnosis not present

## 2020-09-07 DIAGNOSIS — N3281 Overactive bladder: Secondary | ICD-10-CM | POA: Diagnosis not present

## 2020-10-27 ENCOUNTER — Emergency Department (HOSPITAL_COMMUNITY): Payer: Medicare HMO

## 2020-10-27 ENCOUNTER — Other Ambulatory Visit: Payer: Self-pay

## 2020-10-27 ENCOUNTER — Ambulatory Visit (HOSPITAL_COMMUNITY)
Admission: RE | Admit: 2020-10-27 | Discharge: 2020-10-27 | Disposition: A | Discharge status: Home / Self Care | Payer: Medicare HMO | Source: Ambulatory Visit | Attending: Family Medicine | Admitting: Family Medicine

## 2020-10-27 ENCOUNTER — Emergency Department (HOSPITAL_COMMUNITY)
Admission: EM | Admit: 2020-10-27 | Discharge: 2020-10-27 | Disposition: A | Discharge status: Home / Self Care | Payer: Medicare HMO | Attending: Emergency Medicine | Admitting: Emergency Medicine

## 2020-10-27 ENCOUNTER — Encounter (HOSPITAL_COMMUNITY): Payer: Self-pay | Admitting: *Deleted

## 2020-10-27 DIAGNOSIS — S0990XA Unspecified injury of head, initial encounter: Secondary | ICD-10-CM | POA: Insufficient documentation

## 2020-10-27 DIAGNOSIS — W06XXXA Fall from bed, initial encounter: Secondary | ICD-10-CM | POA: Insufficient documentation

## 2020-10-27 DIAGNOSIS — Z5321 Procedure and treatment not carried out due to patient leaving prior to being seen by health care provider: Secondary | ICD-10-CM | POA: Insufficient documentation

## 2020-10-27 DIAGNOSIS — Z7901 Long term (current) use of anticoagulants: Secondary | ICD-10-CM | POA: Insufficient documentation

## 2020-10-27 DIAGNOSIS — M19012 Primary osteoarthritis, left shoulder: Secondary | ICD-10-CM | POA: Diagnosis not present

## 2020-10-27 NOTE — ED Triage Notes (Signed)
PT states she was sitting on the side of her bed after she came back from the bathroom, fell asleep, and fell out of bed.  She hit the R side of her head on the side table and her L shoulder on her walker.  She did not go back to sleep b/c she had a "head injury".  She had missed her Thursday night xarelto, so she took her xarelto at 9 am this morning.  Denies nausea, dizziness.  States R forehead and L shoulder discomfort from impact.  No bruising/ lacs or discoloration.

## 2020-10-27 NOTE — ED Notes (Signed)
Pt LWBS. Pt was advised to stay but said she already saw her results on MyChart and wants to leave.

## 2020-10-27 NOTE — ED Notes (Signed)
Dr Roslynn Amble notified of pt.  He does not feel pt needs to be a leveled trauma.  Stated to order CT head and L shoulder x-ray.  Pt denies neck pain.

## 2020-11-10 ENCOUNTER — Emergency Department (HOSPITAL_BASED_OUTPATIENT_CLINIC_OR_DEPARTMENT_OTHER): Payer: Medicare HMO

## 2020-11-10 ENCOUNTER — Emergency Department (HOSPITAL_BASED_OUTPATIENT_CLINIC_OR_DEPARTMENT_OTHER)
Admission: EM | Admit: 2020-11-10 | Discharge: 2020-11-10 | Disposition: A | Discharge status: Home / Self Care | Payer: Medicare HMO | Attending: Emergency Medicine | Admitting: Emergency Medicine

## 2020-11-10 ENCOUNTER — Encounter (HOSPITAL_BASED_OUTPATIENT_CLINIC_OR_DEPARTMENT_OTHER): Payer: Self-pay | Admitting: Obstetrics and Gynecology

## 2020-11-10 ENCOUNTER — Other Ambulatory Visit: Payer: Self-pay

## 2020-11-10 DIAGNOSIS — I10 Essential (primary) hypertension: Secondary | ICD-10-CM | POA: Diagnosis not present

## 2020-11-10 DIAGNOSIS — Y929 Unspecified place or not applicable: Secondary | ICD-10-CM | POA: Insufficient documentation

## 2020-11-10 DIAGNOSIS — M5136 Other intervertebral disc degeneration, lumbar region: Secondary | ICD-10-CM | POA: Diagnosis not present

## 2020-11-10 DIAGNOSIS — Z86711 Personal history of pulmonary embolism: Secondary | ICD-10-CM | POA: Insufficient documentation

## 2020-11-10 DIAGNOSIS — Y999 Unspecified external cause status: Secondary | ICD-10-CM | POA: Diagnosis not present

## 2020-11-10 DIAGNOSIS — X58XXXA Exposure to other specified factors, initial encounter: Secondary | ICD-10-CM | POA: Insufficient documentation

## 2020-11-10 DIAGNOSIS — Z79899 Other long term (current) drug therapy: Secondary | ICD-10-CM | POA: Insufficient documentation

## 2020-11-10 DIAGNOSIS — S335XXA Sprain of ligaments of lumbar spine, initial encounter: Secondary | ICD-10-CM | POA: Diagnosis not present

## 2020-11-10 DIAGNOSIS — R109 Unspecified abdominal pain: Secondary | ICD-10-CM | POA: Diagnosis not present

## 2020-11-10 DIAGNOSIS — Z7901 Long term (current) use of anticoagulants: Secondary | ICD-10-CM | POA: Insufficient documentation

## 2020-11-10 DIAGNOSIS — S39012A Strain of muscle, fascia and tendon of lower back, initial encounter: Secondary | ICD-10-CM | POA: Diagnosis not present

## 2020-11-10 DIAGNOSIS — M48061 Spinal stenosis, lumbar region without neurogenic claudication: Secondary | ICD-10-CM | POA: Diagnosis not present

## 2020-11-10 DIAGNOSIS — M5441 Lumbago with sciatica, right side: Secondary | ICD-10-CM | POA: Insufficient documentation

## 2020-11-10 DIAGNOSIS — N390 Urinary tract infection, site not specified: Secondary | ICD-10-CM | POA: Diagnosis not present

## 2020-11-10 DIAGNOSIS — S3992XA Unspecified injury of lower back, initial encounter: Secondary | ICD-10-CM | POA: Diagnosis present

## 2020-11-10 DIAGNOSIS — G8929 Other chronic pain: Secondary | ICD-10-CM | POA: Insufficient documentation

## 2020-11-10 DIAGNOSIS — I700 Atherosclerosis of aorta: Secondary | ICD-10-CM | POA: Diagnosis not present

## 2020-11-10 DIAGNOSIS — K5730 Diverticulosis of large intestine without perforation or abscess without bleeding: Secondary | ICD-10-CM | POA: Diagnosis not present

## 2020-11-10 DIAGNOSIS — Y939 Activity, unspecified: Secondary | ICD-10-CM | POA: Insufficient documentation

## 2020-11-10 HISTORY — DX: Bradycardia, unspecified: R00.1

## 2020-11-10 LAB — CBC WITH DIFFERENTIAL/PLATELET
Abs Immature Granulocytes: 0.02 10*3/uL (ref 0.00–0.07)
Basophils Absolute: 0.1 10*3/uL (ref 0.0–0.1)
Basophils Relative: 1 %
Eosinophils Absolute: 0.2 10*3/uL (ref 0.0–0.5)
Eosinophils Relative: 2 %
HCT: 38.6 % (ref 36.0–46.0)
Hemoglobin: 12.5 g/dL (ref 12.0–15.0)
Immature Granulocytes: 0 %
Lymphocytes Relative: 28 %
Lymphs Abs: 2.2 10*3/uL (ref 0.7–4.0)
MCH: 29.4 pg (ref 26.0–34.0)
MCHC: 32.4 g/dL (ref 30.0–36.0)
MCV: 90.8 fL (ref 80.0–100.0)
Monocytes Absolute: 0.8 10*3/uL (ref 0.1–1.0)
Monocytes Relative: 10 %
Neutro Abs: 4.7 10*3/uL (ref 1.7–7.7)
Neutrophils Relative %: 59 %
Platelets: 268 10*3/uL (ref 150–400)
RBC: 4.25 MIL/uL (ref 3.87–5.11)
RDW: 14.1 % (ref 11.5–15.5)
WBC: 7.9 10*3/uL (ref 4.0–10.5)
nRBC: 0 % (ref 0.0–0.2)

## 2020-11-10 LAB — COMPREHENSIVE METABOLIC PANEL
ALT: 10 U/L (ref 0–44)
AST: 17 U/L (ref 15–41)
Albumin: 3.9 g/dL (ref 3.5–5.0)
Alkaline Phosphatase: 74 U/L (ref 38–126)
Anion gap: 9 (ref 5–15)
BUN: 16 mg/dL (ref 8–23)
CO2: 24 mmol/L (ref 22–32)
Calcium: 9 mg/dL (ref 8.9–10.3)
Chloride: 107 mmol/L (ref 98–111)
Creatinine, Ser: 1.19 mg/dL — ABNORMAL HIGH (ref 0.44–1.00)
GFR, Estimated: 49 mL/min — ABNORMAL LOW (ref >60)
Glucose, Bld: 96 mg/dL (ref 70–99)
Potassium: 4.4 mmol/L (ref 3.5–5.1)
Sodium: 140 mmol/L (ref 135–145)
Total Bilirubin: 0.5 mg/dL (ref 0.3–1.2)
Total Protein: 7.3 g/dL (ref 6.5–8.1)

## 2020-11-10 LAB — URINALYSIS, ROUTINE W REFLEX MICROSCOPIC
Bilirubin Urine: NEGATIVE
Glucose, UA: NEGATIVE mg/dL
Hgb urine dipstick: NEGATIVE
Ketones, ur: NEGATIVE mg/dL
Nitrite: NEGATIVE
Protein, ur: 30 mg/dL — AB
Specific Gravity, Urine: 1.027 (ref 1.005–1.030)
WBC, UA: >50 WBC/hpf — ABNORMAL HIGH (ref 0–5)
pH: 7 (ref 5.0–8.0)

## 2020-11-10 MED ORDER — PREDNISONE 10 MG (21) PO TBPK: ORAL_TABLET | Freq: Every day | ORAL | 0 refills | Status: DC

## 2020-11-10 MED ORDER — CEPHALEXIN 500 MG PO CAPS: 500.0000 mg | ORAL_CAPSULE | Freq: Four times a day (QID) | ORAL | 0 refills | Status: DC

## 2020-11-10 NOTE — ED Provider Notes (Addendum)
[facility] EMERGENCY DEPT Provider Note   CSN: [id number] Arrival date & time: 11/10/20  1510     History Chief Complaint  Patient presents with  . Flank Pain    Natasha [name] is a 70 y.o. female.  Presents to ER with concern for back pain.  Reports that she has been having worsening back pain, radiates down right side.  Worsened over the last couple weeks.  No alleviating or aggravating factors.  Has seen chiropractor previously and told likely sciatic nerve.  She also over the last couple days has noted some burning with urination, foul-smelling urine.  Going more frequently.  She denies any frank urinary incontinence.  No bowel incontinence.  No numbness or weakness in her extremities.  No fevers.    HPI     Past Medical History:  Diagnosis Date  . Bradycardia   . 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 Complication of anesthesia    trouble with airway  . Edema 12/22/2008  . History of COVID-19 08/2019  . HYPERTENSION 12/25/2007  . HYPOTHYROIDISM, POSTSURGICAL 05/04/2008  . LEG PAIN, LEFT 12/09/2008  . Muscle weakness   . Numbness and tingling   . OSA (obstructive sleep apnea) 06/15/2013    Patient Active Problem List   Diagnosis Date Noted  . Paroxysmal SVT (supraventricular tachycardia) ([place]) 02/10/2020  . History of pulmonary embolism 02/10/2020  . Bilateral leg edema 02/10/2020  . Paresthesia 01/04/2020  . Gait abnormality 01/04/2020  . Weakness 01/04/2020  . History of 2019 novel coronavirus disease (COVID-19) 11/09/2019  . Muscle weakness (generalized) 11/09/2019  . Numbness and tingling of both feet 11/09/2019  . Memory loss due to medical condition 11/09/2019  . Pulmonary nodule 11/09/2019  . Acute respiratory failure with hypoxia ([place]) 08/24/2019  . Obesity, Class III, BMI 40-49.9  (morbid obesity) ([place]) 08/24/2019  . Pulmonary embolism ([place]) 08/23/2019  . COVID-19 virus infection   . Bradycardia   . Acute pulmonary embolism ([place]) 08/21/2019  . Pneumonia due to COVID-19 virus   . OSA (obstructive sleep apnea) 06/15/2013  . ALLERGIC RHINITIS 01/21/2009  . Peripheral edema 12/22/2008  . LEG PAIN, LEFT 12/09/2008  . DISTURBANCE OF SKIN SENSATION 12/05/2008  . NAUSEA AND VOMITING 05/23/2008  . CARCINOMA, THYROID GLAND, PAPILLARY 05/04/2008  . HYPOTHYROIDISM, POSTSURGICAL 05/04/2008  . Hypertension 12/25/2007  . DOE (dyspnea on exertion) 12/25/2007  . COUGH 12/25/2007    Past Surgical History:  Procedure Laterality Date  . ABDOMINAL HYSTERECTOMY    . ANKLE SURGERY    . CERVICAL FUSION     x 2  . COLONOSCOPY WITH PROPOFOL N/A 08/08/2015   Procedure: COLONOSCOPY WITH PROPOFOL;  Surgeon: Juanita Craver, MD;  Location: [facility];  Service: Endoscopy;  Laterality: N/A;  . KNEE SURGERY    . TOTAL THYROIDECTOMY       OB History    Gravida  3   Para      Term      Preterm      AB      Living  3     SAB      IAB      Ectopic      Multiple      Live Births              Family History  Problem  Relation Age of Onset  . Heart disease Mother   . Kidney disease Mother   . Other Father        unsure of medical history    Social History   Tobacco Use  . Smoking status: Never Smoker  . Smokeless tobacco: Never Used  Vaping Use  . Vaping Use: Never used  Substance Use Topics  . Alcohol use: Yes    Comment: 5 drinks/year  . Drug use: No    Home Medications Prior to Admission medications   Medication Sig Start Date End Date Taking? Authorizing Provider  cephALEXin (KEFLEX) 500 MG capsule Take 1 capsule (500 mg total) by mouth 4 (four) times daily. 11/10/20  Yes Addis Tuohy, Ellwood Dense, MD  predniSONE (STERAPRED UNI-PAK 21 TAB) 10 MG (21) TBPK tablet Take by mouth daily. Take 6 tabs by mouth daily  for 1 day, then 5 tabs for 1 day, then 4 tabs  for 1 day, then 3 tabs for 1 day, 2 tabs for 1 day, then 1 tab by mouth daily for 1 day 11/10/20  Yes Kevork Joyce, Ellwood Dense, MD  albuterol (VENTOLIN HFA) 108 (90 Base) MCG/ACT inhaler Inhale 1-2 puffs into the lungs every 6 (six) hours as needed for wheezing or shortness of breath. 08/18/19   Wieters, Hallie C, PA-C  amLODipine (NORVASC) 5 MG tablet Take 1 tablet (5 mg total) by mouth at bedtime. 01/11/20   Fenton Foy, NP  Dextromethorphan-Guaifenesin (MUCINEX DM MAXIMUM STRENGTH) 60-1200 MG TB12 Take 1 tablet by mouth every 12 (twelve) hours. 12/31/15   Shawnee Knapp, MD  famotidine (PEPCID) 20 MG tablet One after supper 12/27/19   Tanda Rockers, MD  fexofenadine (ALLEGRA) 180 MG tablet Take 180 mg by mouth daily. 06/15/19   [provider]  furosemide (LASIX) 20 MG tablet Take 1 tablet (20 mg total) by mouth 2 (two) times daily. 12/31/19   Buford Dresser, MD  levothyroxine (SYNTHROID) 150 MCG tablet Take 1 tablet (150 mcg total) by mouth daily. 01/11/20 02/10/20  Fenton Foy, NP  Multiple Vitamin (MULTIVITAMIN WITH MINERALS) TABS tablet Take 1 tablet by mouth daily.    [provider]  MYRBETRIQ 25 MG TB24 tablet Take 25 mg by mouth daily. 12/14/17   [provider]  pantoprazole (PROTONIX) 40 MG tablet TAKE 1 TABLET (40 MG TOTAL) BY MOUTH DAILY. TAKE 30-60 MIN BEFORE FIRST MEAL OF THE DAY 07/18/20   Tanda Rockers, MD  rivaroxaban (XARELTO) 20 MG TABS tablet Take 1 tablet (20 mg total) by mouth daily with supper. 09/15/19 12/27/19  Elodia Florence., MD    Allergies    Codeine and Eggs or egg-derived products  Review of Systems   Review of Systems  Constitutional: Negative for chills and fever.  HENT: Negative for ear pain and sore throat.   Eyes: Negative for pain and visual disturbance.  Respiratory: Negative for cough and shortness of breath.   Cardiovascular: Negative for chest pain and palpitations.  Gastrointestinal: Negative for abdominal pain  and vomiting.  Genitourinary: Positive for dysuria. Negative for hematuria.  Musculoskeletal: Positive for back pain. Negative for arthralgias.  Skin: Negative for color change and rash.  Neurological: Negative for seizures and syncope.  All other systems reviewed and are negative.   Physical Exam Updated Vital Signs BP (!) 142/94 (BP Location: Left Arm)   Pulse (!) 57   Temp 97.9 F (36.6 C) (Oral)   Resp 16   Ht 5\' 7"  (1.702 m)  Wt 131 kg   SpO2 100%   BMI 45.23 kg/m   Physical Exam Vitals and nursing note reviewed.  Constitutional:      General: She is not in acute distress.    Appearance: She is well-developed.  HENT:     Head: Normocephalic and atraumatic.  Eyes:     Conjunctiva/sclera: Conjunctivae normal.  Cardiovascular:     Rate and Rhythm: Normal rate and regular rhythm.     Heart sounds: No murmur heard.   Pulmonary:     Effort: Pulmonary effort is normal. No respiratory distress.     Breath sounds: Normal breath sounds.  Abdominal:     Palpations: Abdomen is soft.     Tenderness: There is no abdominal tenderness.     Comments: No rebound or guarding  Musculoskeletal:     Cervical back: Neck supple.     Comments: Some tenderness in the lumbar region, right flank, no rebound or guarding  Skin:    General: Skin is warm and dry.  Neurological:     Mental Status: She is alert.     Comments: 5/5 strength in LE, sensation to light touch intact in LE     ED Results / Procedures / Treatments   Labs (all labs ordered are listed, but only abnormal results are displayed) Labs Reviewed  COMPREHENSIVE METABOLIC PANEL - Abnormal; Notable for the following components:      Result Value   Creatinine, Ser 1.19 (*)    GFR, Estimated 49 (*)    All other components within normal limits  URINALYSIS, ROUTINE W REFLEX MICROSCOPIC - Abnormal; Notable for the following components:   APPearance CLOUDY (*)    Protein, ur 30 (*)    Leukocytes,Ua LARGE (*)    WBC, UA  >50 (*)    Bacteria, UA RARE (*)    All other components within normal limits  CBC WITH DIFFERENTIAL/PLATELET    EKG None  Radiology CT L-SPINE NO CHARGE  Result Date: 11/10/2020 CLINICAL DATA:  Right flank, groin, and abdominal pain. EXAM: CT LUMBAR SPINE WITHOUT CONTRAST TECHNIQUE: Multidetector CT imaging of the lumbar spine was performed without intravenous contrast administration. Multiplanar CT image reconstructions were also generated. COMPARISON:  Lumbar spine MRI 05/28/2018 FINDINGS: Segmentation: 5 lumbar type vertebrae. Alignment: Mild lumbar levoscoliosis.  No significant listhesis. Vertebrae: No acute fracture or suspicious osseous lesion. Widespread degenerative endplate changes and vacuum disc throughout the lumbar and lower thoracic spine. Paraspinal and other soft tissues: Mild abdominal aortic atherosclerosis without aneurysm. Disc levels: Severe disc space narrowing at T12-L1 and from L2-3 to L5-S1 with milder narrowing at L1-2. T12-L1: Disc bulging, endplate spurring, and mild-to-moderate facet hypertrophy result in mild spinal stenosis and mild bilateral neural foraminal stenosis, similar to the prior MRI. L1-2: Disc bulging, endplate spurring, and severe facet hypertrophy result in mild-to-moderate spinal stenosis and mild to moderate right and mild left neural foraminal stenosis, stable to mildly progressed. L2-3: Degenerative fusion across the right aspect of the disc space. Disc bulging, endplate spurring, and mild facet hypertrophy result in moderate right neural foraminal stenosis, mildly progressed. No evidence of significant spinal stenosis. L3-4: Disc bulging, endplate spurring, and right greater than left facet hypertrophy with bulky right-sided spurring extending into the neural foramen result in mild spinal stenosis, bilateral lateral recess stenosis, and severe right and mild left neural foraminal stenosis, similar to the prior MRI. Right L3 nerve root impingement is  likely in the neural foramen. L4-5: Disc bulging and endplate spurring eccentric  to the left and moderate facet hypertrophy result in mild-to-moderate spinal stenosis, left greater than right lateral recess stenosis, and mild right and moderate to severe left neural foraminal stenosis. There is a 6 mm focus of gas anteriorly in the left neural foramen likely arising from the disc space and potentially associated with a disc herniation, and there is potential left L4 nerve root impingement. Findings overall at this level have likely slightly progressed. L5-S1: Disc bulging, endplate spurring, and severe facet hypertrophy result in bilateral lateral recess stenosis and mild-to-moderate right and severe left neural foraminal stenosis without significant generalized spinal stenosis, similar to the prior MRI. Likely left L5 nerve root compression in the neural foramen. IMPRESSION: 1. No acute osseous abnormality identified. 2. Advanced diffuse lumbar disc and facet degeneration, overall mildly progressed from 2019. 3. Widespread neural foraminal stenosis, severe on the right at L3-4 and on the left at L4-5 and L5-S1. 4.  Aortic Atherosclerosis (ICD10-I70.0). Electronically Signed   By: Logan Bores M.D.   On: 11/10/2020 16:48   CT Renal Stone Study  Result Date: 11/10/2020 CLINICAL DATA:  Right flank pain. EXAM: CT ABDOMEN AND PELVIS WITHOUT CONTRAST TECHNIQUE: Multidetector CT imaging of the abdomen and pelvis was performed following the standard protocol without IV contrast. COMPARISON:  May 24, 2020 FINDINGS: Lower chest: No acute abnormality. Hepatobiliary: No focal liver abnormality is seen. No gallstones, gallbladder wall thickening, or biliary dilatation. Pancreas: Unremarkable. No pancreatic ductal dilatation or surrounding inflammatory changes. Spleen: Normal in size without focal abnormality. Adrenals/Urinary Tract: Adrenal glands are unremarkable. Kidneys are normal, without renal calculi, focal  lesion, or hydronephrosis. The urinary bladder is poorly distended and subsequently limited in evaluation. Stomach/Bowel: Stomach is within normal limits. Appendix appears normal. No evidence of bowel wall thickening, distention, or inflammatory changes. Noninflamed diverticula are seen throughout the sigmoid colon. Vascular/Lymphatic: Mild aortic atherosclerosis. No enlarged abdominal or pelvic lymph nodes. Reproductive: Status post hysterectomy. No adnexal masses. Other: No abdominal wall hernia or abnormality. No abdominopelvic ascites. Musculoskeletal: Multilevel degenerative changes seen throughout the lumbar spine. IMPRESSION: 1. Sigmoid diverticulosis. 2. Mild aortic atherosclerosis. Aortic Atherosclerosis (ICD10-I70.0). Electronically Signed   By: Virgina Norfolk M.D.   On: 11/10/2020 16:54    Procedures Procedures   Medications Ordered in ED Medications - No data to display  ED Course  I have reviewed the triage vital signs and the nursing notes.  Pertinent labs & imaging results that were available during my care of the patient were reviewed by me and considered in my medical decision making (see chart for details).    MDM Rules/Calculators/A&P                          70 year old lady presents to ER with concern for right sided pain, low back pain radiating to right side as well as dysuria and foul-smelling urine.  UA concerning for UTI.  CT scan negative for acute abdominal pelvic pathology.  Demonstrated advanced disc degeneration, foraminal stenosis.  At present, patient is neuro intact.  Good strength and sensation, no complaints of numbness or weakness.  Believe she is appropriate for outpatient management at present.  Recommended antibiotics for UTI and trial of steroids for back pain and follow-up with her primary doctor and her spine surgeon.    After the discussed management above, the patient was determined to be safe for discharge.  The patient was in agreement with this  plan and all questions regarding their care were  answered.  ED return precautions were discussed and the patient will return to the ED with any significant worsening of condition.   Final Clinical Impression(s) / ED Diagnoses Final diagnoses:  Lumbar back sprain  Chronic low back pain with right-sided sciatica, unspecified back pain laterality  Urinary tract infection without hematuria, site unspecified    Rx / DC Orders ED Discharge Orders         Ordered    predniSONE (STERAPRED UNI-PAK 21 TAB) 10 MG (21) TBPK tablet  Daily        11/10/20 1717    cephALEXin (KEFLEX) 500 MG capsule  4 times daily        11/10/20 1717           Lucrezia [facility], MD 11/10/20 2143    Lucrezia [facility], MD 11/10/20 2144

## 2020-11-10 NOTE — ED Triage Notes (Signed)
Patient reports to the ER for pain in her right flank, groin, and abdominal pain. 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 Patient reports she had COVID in November and has since also developed incontinence

## 2020-11-10 NOTE — Discharge Instructions (Addendum)
Follow-up with your neurosurgeon and your primary doctor.  Take antibiotic as prescribed.  Return to ER if you develop fever, worsening, uncontrolled pain, numbness, weakness, bladder or bowel incontinence or other new concerning symptoms.

## 2020-11-23 DIAGNOSIS — N3941 Urge incontinence: Secondary | ICD-10-CM | POA: Diagnosis not present

## 2020-11-23 DIAGNOSIS — R8279 Other abnormal findings on microbiological examination of urine: Secondary | ICD-10-CM | POA: Diagnosis not present

## 2020-11-23 DIAGNOSIS — R1030 Lower abdominal pain, unspecified: Secondary | ICD-10-CM | POA: Diagnosis not present

## 2020-11-24 ENCOUNTER — Other Ambulatory Visit: Payer: Self-pay

## 2020-11-24 ENCOUNTER — Ambulatory Visit (INDEPENDENT_AMBULATORY_CARE_PROVIDER_SITE_OTHER): Payer: Medicare HMO | Admitting: Nurse Practitioner

## 2020-11-24 ENCOUNTER — Telehealth (HOSPITAL_BASED_OUTPATIENT_CLINIC_OR_DEPARTMENT_OTHER): Payer: Self-pay

## 2020-11-24 ENCOUNTER — Encounter (HOSPITAL_BASED_OUTPATIENT_CLINIC_OR_DEPARTMENT_OTHER): Payer: Self-pay | Admitting: Nurse Practitioner

## 2020-11-24 VITALS — BP 145/74 | HR 70 | Ht 66.0 in | Wt 291.0 lb

## 2020-11-24 DIAGNOSIS — E890 Postprocedural hypothyroidism: Secondary | ICD-10-CM | POA: Diagnosis not present

## 2020-11-24 DIAGNOSIS — Z1211 Encounter for screening for malignant neoplasm of colon: Secondary | ICD-10-CM

## 2020-11-24 DIAGNOSIS — R1031 Right lower quadrant pain: Secondary | ICD-10-CM

## 2020-11-24 DIAGNOSIS — I50810 Right heart failure, unspecified: Secondary | ICD-10-CM

## 2020-11-24 DIAGNOSIS — Z6841 Body Mass Index (BMI) 40.0 and over, adult: Secondary | ICD-10-CM

## 2020-11-24 DIAGNOSIS — K219 Gastro-esophageal reflux disease without esophagitis: Secondary | ICD-10-CM | POA: Insufficient documentation

## 2020-11-24 DIAGNOSIS — I700 Atherosclerosis of aorta: Secondary | ICD-10-CM

## 2020-11-24 DIAGNOSIS — Z1231 Encounter for screening mammogram for malignant neoplasm of breast: Secondary | ICD-10-CM

## 2020-11-24 DIAGNOSIS — R531 Weakness: Secondary | ICD-10-CM

## 2020-11-24 DIAGNOSIS — I10 Essential (primary) hypertension: Secondary | ICD-10-CM | POA: Diagnosis not present

## 2020-11-24 DIAGNOSIS — M5440 Lumbago with sciatica, unspecified side: Secondary | ICD-10-CM | POA: Diagnosis not present

## 2020-11-24 DIAGNOSIS — R600 Localized edema: Secondary | ICD-10-CM

## 2020-11-24 DIAGNOSIS — G8929 Other chronic pain: Secondary | ICD-10-CM

## 2020-11-24 DIAGNOSIS — Z7689 Persons encountering health services in other specified circumstances: Secondary | ICD-10-CM

## 2020-11-24 DIAGNOSIS — I2699 Other pulmonary embolism without acute cor pulmonale: Secondary | ICD-10-CM

## 2020-11-24 DIAGNOSIS — N3281 Overactive bladder: Secondary | ICD-10-CM | POA: Diagnosis not present

## 2020-11-24 DIAGNOSIS — R269 Unspecified abnormalities of gait and mobility: Secondary | ICD-10-CM | POA: Diagnosis not present

## 2020-11-24 DIAGNOSIS — R32 Unspecified urinary incontinence: Secondary | ICD-10-CM | POA: Insufficient documentation

## 2020-11-24 DIAGNOSIS — E66813 Obesity, class 3: Secondary | ICD-10-CM

## 2020-11-24 DIAGNOSIS — K5900 Constipation, unspecified: Secondary | ICD-10-CM | POA: Insufficient documentation

## 2020-11-24 DIAGNOSIS — C73 Malignant neoplasm of thyroid gland: Secondary | ICD-10-CM

## 2020-11-24 HISTORY — DX: Encounter for screening for malignant neoplasm of colon: Z12.11

## 2020-11-24 HISTORY — DX: Encounter for screening mammogram for malignant neoplasm of breast: Z12.31

## 2020-11-24 HISTORY — DX: Right lower quadrant pain: R10.31

## 2020-11-24 HISTORY — DX: Other chronic pain: G89.29

## 2020-11-24 HISTORY — DX: Persons encountering health services in other specified circumstances: Z76.89

## 2020-11-24 MED ORDER — SYNTHROID 112 MCG PO TABS: 112.0000 ug | ORAL_TABLET | Freq: Every morning | ORAL | 3 refills | Status: DC

## 2020-11-24 MED ORDER — AMLODIPINE BESYLATE 5 MG PO TABS: 5.0000 mg | ORAL_TABLET | Freq: Every day | ORAL | 1 refills | Status: DC

## 2020-11-24 MED ORDER — VALSARTAN-HYDROCHLOROTHIAZIDE 160-12.5 MG PO TABS: 1.0000 | ORAL_TABLET | Freq: Every day | ORAL | 1 refills | Status: DC

## 2020-11-24 MED ORDER — PREDNISONE 50 MG PO TABS
50.0000 mg | ORAL_TABLET | Freq: Every day | ORAL | 0 refills | Status: DC
Start: 2020-11-24 — End: 2020-12-12

## 2020-11-24 MED ORDER — DEXAMETHASONE SODIUM PHOSPHATE 10 MG/ML IJ SOLN
10.0000 mg | Freq: Once | INTRAMUSCULAR | Status: AC
  Administered 2020-11-24: 10 mg via INTRAMUSCULAR

## 2020-11-24 MED ORDER — MYRBETRIQ 25 MG PO TB24: 25.0000 mg | ORAL_TABLET | Freq: Every day | ORAL | 4 refills | Status: DC

## 2020-11-24 NOTE — Assessment & Plan Note (Addendum)
Weakness with need for assistive device for walking.  I am hopeful that PT will help her regain some of her strength and improve her pain symptoms. She is eager to make changes and regain her health.  Order for PT placed.  Plan to work on diet and exercise to reduce excess weight to help with mobility.  Will follow closely to help address needs.

## 2020-11-24 NOTE — Assessment & Plan Note (Addendum)
Significant issues with OAB and incontinent episodes worse after COVID last year.  Unfortunately, this has become nearly debilitating for her and is causing her a great amount of grief.  She was recently started on Gemtesa. Discussed with patient that this medication can take several weeks to reach full effect, but she did see promising results after one dose.  Encouraged patient to continue therapy and we will plan to follow-up in 4 weeks to see how this is working for her.  She may need evaluation for pessary if this is not effective. I do not think she would be a candidate for surgical intervention at this time.  Will work on weight loss and medication management to help improve quality of life and decrease symptoms. Discussed the option of pelvic PT again if she wishes.  Given the severity of her symptoms, we may not be able to accomplish complete resolution, but I am hopeful that we can get her enough relief that she has improved quality of life and is able to travel again.

## 2020-11-24 NOTE — Patient Instructions (Addendum)
Recommendations from today's visit:  I have sent the referral for Ultrasound- they will call you to schedule I have sent the referral for Colonoscopy- they will call you to schedule I have sent the referral for Mammogram- they will call you to schedule I have sent the referral for Physical Therapy- they will call you to schedule  I sent refills on your Myrbetriq, Levothyroxine, Valsartan-HCTZ, and amlodipine to the pharmacy.  I also sent a prescription for 5 days of prednisone to start tomorrow or Sunday (the shot you were given today is a steroid that should last for 1-2 days).   Lets plan to follow-up in about a month to do a physical and we can see how you are progressing with PT or if we need to make any changes.   Do not get discouraged- we will work on this one thing at a time and get you back on your feet. :-)  Thank you for choosing [facility] for your Primary Care needs. I am excited for the opportunity to partner with you to meet your health care goals. It was a pleasure meeting you today!  I am an Adult-Geriatric Nurse Practitioner with a background in caring for patients for more than 20 years. I received my Paediatric nurse in Nursing and my Doctor of Nursing Practice degrees at [employer]. I received additional fellowship training in primary care and sports medicine after receiving my doctorate degree. I provide primary care and sports medicine services to patients age 75 and older within this office. I am also a provider with the [facility] and the director of the APP Fellowship with [facility]  I am a [place] native, but have called the [place] area home for nearly 20 years and am proud to be a member of this community.   I am passionate about providing the best service to you through preventive medicine and supportive care. I consider you a part of the medical team and value your input. I work  diligently to ensure that you are heard and your needs are met in a safe and effective manner. I want you to feel comfortable with me as your provider and want you to know that your health concerns are important to me.   For your information, our office hours are Monday- Friday 8:00 AM - 5:00 PM At this time I am not in the office on Wednesdays.  If you have questions or concerns, please call our office at [phone] or send [place] a MyChart message and we will respond as quickly as possible.   For all urgent or time sensitive needs we ask that you please call the office to avoid delays. MyChart is not constantly monitored and replies may take up to 72 business hours.  MyChart Policy: . MyChart allows for you to see your visit notes, after visit summary, provider recommendations, lab and tests results, make an appointment, request refills, and contact your provider or the office for non-urgent questions or concerns.  . Providers are seeing patients during normal business hours and do not have built in time to review MyChart messages. We ask that you allow a minimum of 72 business hours for MyChart message responses.  . Complex MyChart concerns may require a visit. Your provider may request you schedule a virtual or in person visit to ensure we are providing the best care possible. . MyChart messages sent after 4:00 PM on Friday will not be  received by the provider until Monday morning.    Lab and Test Results: . You will receive your lab and test results on MyChart as soon as they are completed and results have been sent by the lab or testing facility. Due to this service, you will receive your results BEFORE your provider.  . Please allow a minimum of 72 business hours for your provider to receive and review lab and test results and contact you about.   . Most lab and test result comments from the provider will be sent through [place]. Your provider may recommend changes to the plan of care,  follow-up visits, repeat testing, ask questions, or request an office visit to discuss these results. You may reply directly to this message or call the office at [phone] to provide information for the provider or set up an appointment. . In some instances, you will be called with test results and recommendations. Please let us know if this is preferred and we will make note of this in your chart to provide this for you.    . If you have not heard a response to your lab or test results in 72 business hours, please call the office to let us know.   After Hours: . For all non-emergency after hours needs, please call the office at [phone] and select the option to reach the on-call provider service. On-call services are shared between multiple [facility] offices and therefore it will not be possible to speak directly with your provider. On-call providers may provide medical advice and recommendations, but are unable to provide refills for maintenance medications.  . For all emergency or urgent medical needs after normal business hours, we recommend that you seek care at the closest Urgent Care or Emergency Department to ensure appropriate treatment in a timely manner.  [facility] has a 24 hour emergency room located on the ground floor for your convenience.    Please do not hesitate to reach out to [place] with concerns.   Thank you, again, for choosing me as your health care partner. I appreciate your trust and look forward to learning more about you.   Worthy Keeler, DNP, AGNP-c _____________________________________________________________________________________

## 2020-11-24 NOTE — Telephone Encounter (Signed)
Received medical records from [facility] and gave to [facility], AGNP.

## 2020-11-24 NOTE — Assessment & Plan Note (Signed)
CT reveals significant changes within the spin that are likely resulting in the subsequent pain.  Discussed with patient that we may not be able to completely rid her pain, but we will attempt options to relieve some of her discomfort with medications and therapy and try to avoid surgery. She is likely not a surgical candidate given co-morbidities and age, but we will address this if necessary. Plan for now is to begin PT. Steroid injection provided today with low dose steroid burst x 5 days to start over the weekend to see if this helps with any of her symptoms.  Follow-up in 4 weeks.

## 2020-11-24 NOTE — Assessment & Plan Note (Signed)
Aortic atherosclerosis detected on CT scan.  She will likely be in need of statin therapy for optimal management and preventative measures.  She has not seen a PCP in quite some time and will need multiple health concerns addressed over the coming year.  Plan to obtain labs and reassess in 4 weeks with CPE.

## 2020-11-24 NOTE — Assessment & Plan Note (Addendum)
RLQ pain in the setting of severe degenerative back changes and obesity.  CT has been unrevealing of possible abdominal cause, but will order ultrasound to specifically look in this location to see if there are any changes since CT was done in September of last year.  Highly suspicious that her back and added abdominal weight are causing nerve impingement and symptoms present.  PT ordered to help with mobility and movement. Steroid injection and steroid burst provided today to help with inflammation. If this is effective, highly likely the process is inflammatory and likely from listed causes above.  Will work with patient to help achieve weight loss goals and pain management to decrease her pain and help improve mobility and quality of life.

## 2020-11-24 NOTE — Assessment & Plan Note (Signed)
Increased body mass in the setting of chronic pain, chronic illness, and mobility challenges. Weight loss goal of 50 pounds has been made by patient.  Plan to start with PT to help with pain and mobility and will work to find interventions that may be helpful in her weight loss journey.  Will monitor TSH levels at next visit in 4 weeks to ensure thyroid is not contributing.

## 2020-11-24 NOTE — Assessment & Plan Note (Signed)
Large PE discovered while hospitalized with COVID. She is regularly taking xarelto as prescribed. No O2 deficits or shortness of breath. She does endorse cold intolerance due to medication  It appears that cardiology referral was placed previously, she will likely need an echocardiogram for evaluation.  Will obtain records for evaluation and repeat referral if necessary.

## 2020-11-24 NOTE — Assessment & Plan Note (Addendum)
New patient in office today with multiple co-morbidities and concerns affecting her overall health and wellbeing.  Discussed slowly working to help catch patient up to health care needs and meet goals with interventions along the way.  This journey will not be a quick fix, but will work with patient to meet her needs and address her issues while restoring health. Significant amount of time spent discussing patients health concerns and conditions. I have the sense that she is overwhelmed with all that is going on.   Plan to meet in 4 weeks for CPE with labs and will discuss plan further.

## 2020-11-24 NOTE — Assessment & Plan Note (Signed)
History of right sided heart failure. Patient has not seen PCP in quite some time. Will obtain records for evaluation.  She is intermittently taking lasix, but due to incontinence issues she is not consistent with this.  Due to limitations of visit will address at upcoming appointments.

## 2020-11-24 NOTE — Assessment & Plan Note (Signed)
>>  ASSESSMENT AND PLAN FOR OAB (OVERACTIVE BLADDER) WRITTEN ON 11/27/2020  1:01 PM BY Tayah Idrovo E, NP  Significant issues with OAB and incontinent episodes worse after COVID last year.  Unfortunately, this has become nearly debilitating for her and is causing her a great amount of grief.  She was recently started on Gemtesa . Discussed with patient that this medication can take several weeks to reach full effect, but she did see promising results after one dose.  Encouraged patient to continue therapy and we will plan to follow-up in 4 weeks to see how this is working for her.  She may need evaluation for pessary if this is not effective. I do not think she would be a candidate for surgical intervention at this time.  Will work on weight loss and medication management to help improve quality of life and decrease symptoms. Discussed the option of pelvic PT again if she wishes.  Given the severity of her symptoms, we may not be able to accomplish complete resolution, but I am hopeful that we can get her enough relief that she has improved quality of life and is able to travel again.

## 2020-11-24 NOTE — Assessment & Plan Note (Signed)
Hypothyroid due to thyroid carcinoma and subsequent removal. Taking levothyroxine.  It is unclear when her thyroid levels were last assessed. Will refill her medication today but plan for thyroid panel at next visit. Discussed estrogen negative effects of estrogen replacement and discouraged patient from this due to DVT and PE history. She expressed understanding.  Will evaluate records to determine if need for thyroid ultrasound or other examination is warranted given voice changes. This is likely due to increased mucous production, but do not want to ignore concerning symptoms.

## 2020-11-24 NOTE — Progress Notes (Signed)
New Patient Office Visit  Subjective:  Patient ID: Natasha [name], female    DOB: May 06, 1951  Age: 70 y.o. MRN: [id number]  CC: No chief complaint on file.   HPI Natasha [name] is a pleasant 70 year old female who presents today to establish care and discuss several ongoing health issues which are concerning to her.   She reports need for colonoscopy and mammogram.  She would like to discuss her thyroid.  She endorses significant issue with urinary incontinence. She endorses bilateral low back pain. She endorses RLQ abdominal pain with radiation into right groin and leg.  She is interested in weight loss.  Thyroid She reports that she underwent a thyroidectomy due to cancer many years ago. She tells me at the time of the surgery the physician informed her that he "scraped away all he could from the throat", but was not sure that he had removed all of the thyroid or possible cancer. She tells me that he told her she would have to wait and see if there would be residual issues in the future. She would like to know what she can do to determine if there are issues with possible cancer in her throat or larynx. She has been experiencing a change in her voice over the past several months. She also endorses weakness in her voice and increased mucous build up in her throat. She is unsure what is causing this. She is also interested in if she needs hormone testing to determine if she could use replacement hormones for testosterone or estrogen given the removal of the thyroid.   Incontinence She tells me that one of her main concerns today is her urinary incontinence symptoms which started in about 2010, but have become nearly debilitating since she had COVID in November 2020. She endorses urge incontinence with large volume voiding throughout the day and night. She endorses an average of 4 night time awakenings with need to urinate. She tells me that she is most often unable to make it to the bathroom  and has an accident on herself requiring a complete clothing change and sometimes bed change despite wearing incontinence underwear and pad together. She also reports daytime incontinence with similar situations. She reports that she has had to stop traveling and socializing due to this issue and it is negatively affecting her quality of life. She has been evaluated by urology and they have started her on Myrbetriq and [place] She just started [facility] yesterday and reports that she did notice only needing to get up twice in the middle of the night to urinate. She reports that she has lasix, but does not take it on a regular basis. She endorses lower extremity edema during the day which is improved by morning. She has tried pelvic PT in the past, which was not helpful. She expresses frustration over her symptoms and strongly desires to remedy this issue.   Bilateral Low Back Pain She endorses bilateral low back pain with difficulty sitting, standing, or walking for any period of time. She tells me that this has been ongoing for at least the past 2 years. It has worsening in the last few months. She recently had a CT performed which showed significant degenerative changes and neural foraminal stenosis as well as bulging discs and spurs in the thoracic and lumbar areas. She tells me that the ED physician told her she will need spinal surgery to help with this issues. She has had cervical spine surgery three times  and does not wish to proceed with surgery at this time. She expresses concern that she would not be a candidate for surgical intervention. She tells me the pain has led her to need to walk with a rolling walker and she finds it difficult to stand up straight. She has difficulty getting in and out of chairs, as well. She reports that her son, who is a Restaurant manager, fast food, had her stand against a wall for increasing periods of time to help stretch the back. She tells me initially this was painful but after a few  minutes, the pain improved and she was able to maintain the stance. She wants to know what could be done to help with this issue besides surgery.   RLQ Pain She endorses RLQ pain that has been ongoing for about 2 years, but is worsening to a constant state. She was found to have a UTI earlier this year and was sent to urology for evaluation. She reports that while taking the antibiotic and steroid her pain was relieved and she has concerns of possible infectious source. She has had a CT abdominal and pelvis which revealed abnormalities limited to mild sigmoid diverticulosis and aortic atherosclerosis only. No evidence of hydronephrosis, abnormality in organ structures, enlarged lymph nodes. She has had a hysterectomy, but does have her ovaries in place. She describes the pain as radiating into the groin crease between the right thigh and abdomen. She tells me that sometimes the pain radiates. She reports her son told her he believes the pain could be coming from possible piriformis syndrome, but she is unsure. She is requesting an abdominal ultrasound to look into the area to ensure that there is no abnormality present. She is also unsure the pain is coming from her spine.   Weight loss  She reports that she feels that many of her issues could be improved with weight loss and she has a desire to loss approximately 150 pounds total. She is frustrated because her pain limits her from physical activity that is necessary to achieve this. She has set a short term goal of 50 pound weight loss and is hoping that with decreased pain she will be able to achieve this. She reports she eats very little and what she does eat is healthy, but she is able to get no regular physical activity due to her limitations.   Past Medical History:  Diagnosis Date  . Bradycardia   . CARCINOMA, THYROID GLAND, PAPILLARY 05/04/2008   Stage 2, 8/09: thyroidectomy for 2.7cm papillary adenocarcinoma (t2 n0 mo) 9/09: I-131 rx, 108 [facility]  05/10: tg is neg (ab neg) , total body scan is neg  . Complication of anesthesia    trouble with airway  . COVID-19 08/19/2019  . Edema 12/22/2008  . History of 2019 novel coronavirus disease (COVID-19) 11/09/2019  . History of COVID-19 08/2019  . HYPERTENSION 12/25/2007  . HYPOTHYROIDISM, POSTSURGICAL 05/04/2008  . LEG PAIN, LEFT 12/09/2008  . Muscle weakness   . Numbness and tingling   . OSA (obstructive sleep apnea) 06/15/2013    Past Surgical History:  Procedure Laterality Date  . ABDOMINAL HYSTERECTOMY    . ANKLE SURGERY    . CERVICAL FUSION     x 2  . COLONOSCOPY WITH PROPOFOL N/A 08/08/2015   Procedure: COLONOSCOPY WITH PROPOFOL;  Surgeon: Juanita Craver, MD;  Location: [facility];  Service: Endoscopy;  Laterality: N/A;  . KNEE SURGERY    . TOTAL THYROIDECTOMY      Family History  Problem Relation Age of Onset  . Heart disease Mother   . Kidney disease Mother   . Other Father        unsure of medical history    Social History   Socioeconomic History  . Marital status: Widowed    Spouse name: Not on file  . Number of children: 3  . Years of education: college  . Highest education level: Master's degree (e.g., MA, MS, MEng, MEd, MSW, MBA)  Occupational History  . Occupation: Teacher - [employer]  Tobacco Use  . Smoking status: Never Smoker  . Smokeless tobacco: Never Used  Vaping Use  . Vaping Use: Never used  Substance and Sexual Activity  . Alcohol use: Yes    Comment: 5 drinks/year  . Drug use: No  . Sexual activity: Not Currently  Other Topics Concern  . Not on file  Social History Narrative   Lives alone.   Right-handed.   No daily caffeine use.   Social Determinants of Health   Financial Resource Strain: Not on file  Food Insecurity: Not on file  Transportation Needs: Not on file  Physical Activity: Not on file  Stress: Not on file  Social Connections: Not on file  Intimate Partner Violence: Not on file    ROS Review of Systems   Constitutional: Positive for activity change and fatigue.  HENT: Positive for postnasal drip, rhinorrhea and voice change. Negative for sore throat.   Respiratory: Positive for shortness of breath. Negative for cough and chest tightness.   Cardiovascular: Positive for leg swelling. Negative for chest pain and palpitations.  Gastrointestinal: Positive for abdominal pain. Negative for anal bleeding, blood in stool, constipation, diarrhea, nausea, rectal pain and vomiting.  Endocrine: Positive for cold intolerance.  Genitourinary: Positive for frequency and urgency.  Musculoskeletal: Positive for arthralgias, back pain, gait problem, joint swelling and myalgias. Negative for neck pain and neck stiffness.  Skin: Negative for color change, pallor, rash and wound.  Neurological: Positive for weakness and numbness. Negative for dizziness, facial asymmetry and headaches.  Psychiatric/Behavioral: Positive for dysphoric mood and sleep disturbance. The patient is not nervous/anxious.     Objective:   Today's Vitals: There were no vitals taken for this visit.  Physical Exam Vitals and nursing note reviewed.  Constitutional:      Appearance: Normal appearance. She is well-groomed. She is obese. She is not ill-appearing.  HENT:     Head: Normocephalic.  Eyes:     Extraocular Movements: Extraocular movements intact.     Conjunctiva/sclera: Conjunctivae normal.     Pupils: Pupils are equal, round, and reactive to light.  Neck:     Thyroid: No thyroid mass, thyromegaly or thyroid tenderness.     Vascular: No carotid bruit.  Cardiovascular:     Rate and Rhythm: Normal rate and regular rhythm.     Pulses: Normal pulses.     Heart sounds: Normal heart sounds.  Pulmonary:     Effort: Pulmonary effort is normal.     Breath sounds: Normal breath sounds. No wheezing.  Abdominal:     General: Bowel sounds are normal.     Tenderness: There is abdominal tenderness in the right lower quadrant. There is  no right CVA tenderness, left CVA tenderness, guarding or rebound.     Hernia: No hernia is present.    Musculoskeletal:     Cervical back: Normal range of motion.     Right lower leg: Edema present.     Left lower leg:  Edema present.  Lymphadenopathy:     Cervical: No cervical adenopathy.  Skin:    General: Skin is warm and dry.     Capillary Refill: Capillary refill takes less than 2 seconds.  Neurological:     General: No focal deficit present.     Mental Status: She is alert and oriented to person, place, and time.     Sensory: No sensory deficit.     Motor: Weakness present.     Gait: Gait abnormal.  Psychiatric:        Attention and Perception: Attention normal.        Mood and Affect: Mood normal.        Speech: Speech normal.        Behavior: Behavior normal. Behavior is cooperative.        Thought Content: Thought content normal.        Judgment: Judgment normal.     Assessment & Plan:   Problem List Items Addressed This Visit   None     Outpatient Encounter Medications as of 11/24/2020  Medication Sig  . albuterol (VENTOLIN HFA) 108 (90 Base) MCG/ACT inhaler Inhale 1-2 puffs into the lungs every 6 (six) hours as needed for wheezing or shortness of breath.  Marland Kitchen amLODipine (NORVASC) 5 MG tablet Take 1 tablet (5 mg total) by mouth at bedtime.  . cephALEXin (KEFLEX) 500 MG capsule Take 1 capsule (500 mg total) by mouth 4 (four) times daily.  Marland Kitchen Dextromethorphan-Guaifenesin (MUCINEX DM MAXIMUM STRENGTH) 60-1200 MG TB12 Take 1 tablet by mouth every 12 (twelve) hours.  Marland Kitchen dicyclomine (BENTYL) 10 MG capsule   . famotidine (PEPCID) 20 MG tablet One after supper  . fexofenadine (ALLEGRA) 180 MG tablet Take 180 mg by mouth daily.  . furosemide (LASIX) 20 MG tablet Take 1 tablet (20 mg total) by mouth 2 (two) times daily.  Marland Kitchen levothyroxine (SYNTHROID) 150 MCG tablet Take 1 tablet (150 mcg total) by mouth daily.  . Multiple Vitamin (MULTIVITAMIN WITH MINERALS) TABS tablet Take 1  tablet by mouth daily.  Marland Kitchen MYRBETRIQ 25 MG TB24 tablet Take 25 mg by mouth daily.  Renda Rolls AD 4-10-325 MG TABS Take 1 tablet by mouth 4 (four) times daily.  Marland Kitchen oxybutynin (DITROPAN-XL) 10 MG 24 hr tablet   . pantoprazole (PROTONIX) 40 MG tablet TAKE 1 TABLET (40 MG TOTAL) BY MOUTH DAILY. TAKE 30-60 MIN BEFORE FIRST MEAL OF THE DAY  . predniSONE (STERAPRED UNI-PAK 21 TAB) 10 MG (21) TBPK tablet Take by mouth daily. Take 6 tabs by mouth daily  for 1 day, then 5 tabs for 1 day, then 4 tabs for 1 day, then 3 tabs for 1 day, 2 tabs for 1 day, then 1 tab by mouth daily for 1 day  . rivaroxaban (XARELTO) 20 MG TABS tablet Take 1 tablet (20 mg total) by mouth daily with supper.  Marland Kitchen SYNTHROID 112 MCG tablet Take 112 mcg by mouth every morning.  . valsartan-hydrochlorothiazide (DIOVAN-HCT) 160-12.5 MG tablet Take 1 tablet by mouth daily.   No facility-administered encounter medications on file as of 11/24/2020.   Time: 90 minutes, >50% spent counseling, care coordination, chart review, and documentation.    Follow-up: No follow-ups on file.   Orma Render, NP

## 2020-11-24 NOTE — Assessment & Plan Note (Signed)
Blood pressure 145/74 in office today.  She reports that she is typically well controlled, but does endorse anxiety over the visit today and her health.  Discussed that her goal BP would be 140/80 or less given her age and conditions.  We will continue to monitor and address at upcoming visits.

## 2020-11-27 NOTE — Assessment & Plan Note (Signed)
>>  ASSESSMENT AND PLAN FOR BILATERAL LOWER EXTREMITY EDEMA WRITTEN ON 11/27/2020  1:11 PM BY Salisa Broz E, NP  Bilateral LE edema in the setting of right sided heart failure.  At this time she has multiple issues working against her to best manage her condition. We will likely need a multifactorial approach to management of her chronic health conditions to get her back to a more functional baseline and prevent progression of her conditions.  Intermittent lasix  use recommended- will obtain labs on kidney function today. Given her incontinence concerns, this is not ideal, but our only option at this point. Her legs are significantly edematous and she would not be able to get compression stockings on with her back issues. I am hopeful that PT will be helpful in progressing her physical activity and improving some of her symptoms so we can focus on her other conditions. She will likely need cardiology referral for work-up with ECHO in the near future.  Plan to obtain records for review of past cardiology recommendations and testing before proceeding.  Will discuss with patient in 4 weeks at follow-up appointment.

## 2020-11-27 NOTE — Assessment & Plan Note (Signed)
Due for mammogram.  Order placed.

## 2020-11-27 NOTE — Assessment & Plan Note (Signed)
Bilateral LE edema in the setting of right sided heart failure.  At this time she has multiple issues working against her to best manage her condition. We will likely need a multifactorial approach to management of her chronic health conditions to get her back to a more functional baseline and prevent progression of her conditions.  Intermittent lasix use recommended- will obtain labs on kidney function today. Given her incontinence concerns, this is not ideal, but our only option at this point. Her legs are significantly edematous and she would not be able to get compression stockings on with her back issues. I am hopeful that PT will be helpful in progressing her physical activity and improving some of her symptoms so we can focus on her other conditions. She will likely need cardiology referral for work-up with ECHO in the near future.  Plan to obtain records for review of past cardiology recommendations and testing before proceeding.  Will discuss with patient in 4 weeks at follow-up appointment.

## 2020-11-27 NOTE — Assessment & Plan Note (Signed)
>>  ASSESSMENT AND PLAN FOR GAIT INSTABILITY WRITTEN ON 03/04/2024  5:39 PM BY Libby Goehring E, NP   >>ASSESSMENT AND PLAN FOR GAIT ABNORMALITY WRITTEN ON 11/27/2020  1:05 PM BY Seara Hinesley E, NP  Gait abnormality in the setting of chronic degeneration of spine and with nerve impingement as well as obesity and deconditioning related to hospitalization last year from COVID.  PT referral placed in hopes that we can help improve her strength and begin on the journey to loosing some weight, which should help with some of her symptoms.  Given the severity of her back complications, it is highly unlikely that we will see full resolution of all symptoms, but at this time she is likely not a candidate for surgery therefore we will have to work with conservative treatments.  Follow-up in 4 weeks or sooner if needed.

## 2020-11-27 NOTE — Assessment & Plan Note (Signed)
Surgical removal with hormone management of levothyroxine.  Will obtain labs today for further evaluation and attempt to get external records for review.

## 2020-11-27 NOTE — Assessment & Plan Note (Signed)
>>  ASSESSMENT AND PLAN FOR GAIT ABNORMALITY WRITTEN ON 11/27/2020  1:05 PM BY Jamea Robicheaux E, NP  Gait abnormality in the setting of chronic degeneration of spine and with nerve impingement as well as obesity and deconditioning related to hospitalization last year from COVID.  PT referral placed in hopes that we can help improve her strength and begin on the journey to loosing some weight, which should help with some of her symptoms.  Given the severity of her back complications, it is highly unlikely that we will see full resolution of all symptoms, but at this time she is likely not a candidate for surgery therefore we will have to work with conservative treatments.  Follow-up in 4 weeks or sooner if needed.

## 2020-11-27 NOTE — Assessment & Plan Note (Signed)
Due for colon cancer screening.  Order placed for colonoscopy.

## 2020-11-27 NOTE — Assessment & Plan Note (Signed)
Gait abnormality in the setting of chronic degeneration of spine and with nerve impingement as well as obesity and deconditioning related to hospitalization last year from [place].  PT referral placed in hopes that we can help improve her strength and begin on the journey to loosing some weight, which should help with some of her symptoms.  Given the severity of her back complications, it is highly unlikely that we will see full resolution of all symptoms, but at this time she is likely not a candidate for surgery therefore we will have to work with conservative treatments.  Follow-up in 4 weeks or sooner if needed.

## 2020-11-29 ENCOUNTER — Other Ambulatory Visit: Payer: Self-pay

## 2020-11-29 ENCOUNTER — Ambulatory Visit (HOSPITAL_BASED_OUTPATIENT_CLINIC_OR_DEPARTMENT_OTHER)
Admission: RE | Admit: 2020-11-29 | Discharge: 2020-11-29 | Disposition: A | Discharge status: Home / Self Care | Payer: Medicare HMO | Source: Ambulatory Visit | Attending: Nurse Practitioner | Admitting: Nurse Practitioner

## 2020-11-29 ENCOUNTER — Telehealth: Payer: Self-pay | Admitting: Internal Medicine

## 2020-11-29 DIAGNOSIS — Z1231 Encounter for screening mammogram for malignant neoplasm of breast: Secondary | ICD-10-CM

## 2020-11-29 DIAGNOSIS — R1031 Right lower quadrant pain: Secondary | ICD-10-CM

## 2020-11-29 NOTE — Telephone Encounter (Signed)
Hi Dr. Hilarie Fredrickson,   We received a referral from PCP for patient to have a repeat colonoscopy. She had one done with Dr. Collene Mares back in 2016 reports are in Epic for review.    Please advise on scheduling.  Thank you

## 2020-11-30 NOTE — Progress Notes (Signed)
Ultrasound of abdomen normal with no indication that intraabdominal process is causing symptoms presenting. Findings reiterate the thought process that the pain is likely lumbar radiculopathy associated with the degeneration and changes of the spine.   Recommend starting physical therapy and working on changing dietary habits to help with weight loss.

## 2020-11-30 NOTE — Progress Notes (Signed)
Mammogram shows no signs of malignancy. Patient informed by imaging center by mail.

## 2020-12-01 NOTE — Progress Notes (Signed)
Results reviewed by patient via MyChart. Instructed patient to contact the office with any questions or concerns.  

## 2020-12-01 NOTE — Telephone Encounter (Signed)
Tubular adenoma x2 in 2016 Okay to repeat colonoscopy now for surveillance

## 2020-12-04 ENCOUNTER — Telehealth (HOSPITAL_BASED_OUTPATIENT_CLINIC_OR_DEPARTMENT_OTHER): Payer: Self-pay

## 2020-12-04 DIAGNOSIS — G8929 Other chronic pain: Secondary | ICD-10-CM

## 2020-12-04 DIAGNOSIS — R202 Paresthesia of skin: Secondary | ICD-10-CM

## 2020-12-04 DIAGNOSIS — M5440 Lumbago with sciatica, unspecified side: Secondary | ICD-10-CM

## 2020-12-04 DIAGNOSIS — M5432 Sciatica, left side: Secondary | ICD-10-CM

## 2020-12-04 DIAGNOSIS — R200 Anesthesia of skin: Secondary | ICD-10-CM

## 2020-12-04 NOTE — Telephone Encounter (Signed)
Pt called with complaints of continued right sided pain and lower back pain Pt states she completed prednisone with no relief Pt is requesting a tele health visit Please advise.

## 2020-12-04 NOTE — Telephone Encounter (Signed)
I don't mind to do a telehealth visit with her. Unfortunately, based on the imaging we have her back is in very poor shape and I am not sure there is much that I can offer her as far as relief other than physical therapy and medication. We can certainly send her to neurology for further evaluation to see if they have any suggestions for procedures that may be helpful. If she would like I will gladly place a referral for that.

## 2020-12-05 MED ORDER — GABAPENTIN 100 MG PO CAPS: ORAL_CAPSULE | ORAL | 3 refills | Status: DC

## 2020-12-05 NOTE — Addendum Note (Signed)
Addended by: Kymberlie Brazeau, Clarise Cruz E on: 12/05/2020 11:18 AM   Modules accepted: Orders

## 2020-12-05 NOTE — Addendum Note (Signed)
Addended by: Campbell Riches on: 12/05/2020 09:55 AM   Modules accepted: Orders

## 2020-12-05 NOTE — Telephone Encounter (Signed)
Called patient to discuss recommendation from Worthy Keeler, AGNP Patient is aware that a telehealth visit would be of little benefit due to progression of DDD Patient is agreeable to starting physical therapy and medication management for her pain Patient is agreeable to Neurology referral -- referral placed Will defer to Worthy Keeler, AGNP for Gabapentin

## 2020-12-05 NOTE — Telephone Encounter (Signed)
Patient is scheduled to begin PT tomorrow, evaluation and treatment will be beneficial to her pain and mobility.  Referral to neurology has been placed for evaluation and determination of any treatment options that may be helpful. Will start low dose gabapentin to see if this is helpful for her pain, as it is neuropathic in nature most likely related to the degeneration in her back. Will need to keep dosage low to reduce risk of falls, but she would benefit from pain relief. Will avoid narcotics as these are likely not going to be beneficial to neuropathic pain and will increase fall risks.

## 2020-12-06 ENCOUNTER — Encounter (HOSPITAL_BASED_OUTPATIENT_CLINIC_OR_DEPARTMENT_OTHER): Payer: Self-pay

## 2020-12-06 ENCOUNTER — Ambulatory Visit (HOSPITAL_BASED_OUTPATIENT_CLINIC_OR_DEPARTMENT_OTHER): Payer: Medicare HMO | Attending: Nurse Practitioner | Admitting: Physical Therapy

## 2020-12-06 ENCOUNTER — Other Ambulatory Visit: Payer: Self-pay

## 2020-12-06 DIAGNOSIS — M25551 Pain in right hip: Secondary | ICD-10-CM | POA: Diagnosis not present

## 2020-12-06 DIAGNOSIS — R293 Abnormal posture: Secondary | ICD-10-CM | POA: Diagnosis not present

## 2020-12-06 DIAGNOSIS — M6281 Muscle weakness (generalized): Secondary | ICD-10-CM

## 2020-12-06 DIAGNOSIS — R2689 Other abnormalities of gait and mobility: Secondary | ICD-10-CM

## 2020-12-06 DIAGNOSIS — R262 Difficulty in walking, not elsewhere classified: Secondary | ICD-10-CM | POA: Diagnosis not present

## 2020-12-06 DIAGNOSIS — M25651 Stiffness of right hip, not elsewhere classified: Secondary | ICD-10-CM | POA: Insufficient documentation

## 2020-12-06 NOTE — Telephone Encounter (Signed)
err

## 2020-12-07 ENCOUNTER — Encounter (HOSPITAL_BASED_OUTPATIENT_CLINIC_OR_DEPARTMENT_OTHER): Payer: Self-pay | Admitting: Physical Therapy

## 2020-12-07 NOTE — Therapy (Addendum)
[facility] [address] [place], [place], [address] Phone: [phone]   Fax:  [phone]  Physical Therapy Evaluation and Discharge  Patient Details  Name: Natasha [name] MRN: [id number] Date of Birth: Jan 08, 1951 Referring Provider (PT): Early, Natasha Pesa, NP  PHYSICAL THERAPY DISCHARGE SUMMARY  Visits from Start of Care: 1  Current functional level related to goals / functional outcomes: See below   Remaining deficits: See below   Education / Equipment: See below  Plan: Patient agrees to discharge.  Patient goals were not met. Patient is being discharged due to not returning since initial evaluation.        Encounter Date: 12/06/2020   PT End of Session - 12/06/20 1442     Visit Number 1    Number of Visits 17    Date for PT Re-Evaluation 02/01/21    Authorization Type BC[employer]    PT Start Time 7341    PT Stop Time 1515    PT Time Calculation (min) 37 min    Activity Tolerance Patient tolerated treatment well    Behavior During Therapy WFL for tasks assessed/performed             Past Medical History:  Diagnosis Date   Bradycardia    CARCINOMA, THYROID GLAND, PAPILLARY 05/04/2008   Stage 2, 8/09: thyroidectomy for 2.7cm papillary adenocarcinoma (t2 n0 mo) 9/09: I-131 rx, 108 [facility] 05/10: tg is neg (ab neg) , total body scan is neg   Colon polyps    Complication of anesthesia    trouble with airway   COVID-19 08/19/2019   Edema 12/22/2008   History of 2019 novel coronavirus disease (COVID-19) 11/09/2019   History of COVID-19 08/2019   HYPERTENSION 12/25/2007   HYPOTHYROIDISM, POSTSURGICAL 05/04/2008   LEG PAIN, LEFT 12/09/2008   Muscle weakness    Numbness and tingling    OSA (obstructive sleep apnea) 06/15/2013    Past Surgical History:  Procedure Laterality Date   ABDOMINAL HYSTERECTOMY     ANKLE SURGERY     CERVICAL FUSION     x 2   COLONOSCOPY WITH PROPOFOL N/A 08/08/2015   Procedure: COLONOSCOPY WITH  PROPOFOL;  Surgeon: Natasha Craver, MD;  Location: [facility];  Service: Endoscopy;  Laterality: N/A;   KNEE SURGERY     TOTAL THYROIDECTOMY      There were no vitals filed for this visit.    Subjective Assessment - 12/06/20 1443     Subjective Pt reports severe pain in her R goin (~6 months to a year). It normally comes and goes but now it is constant. Pt states she's had multiple imaging but nothing was found abnormal. Pt has been scheduled for a colonoscopy consult. Pt believes this may have been a result from having a terrible accident a few years ago resulting in cervical fusion and low back pain. Pt has found that she can manage pain with sitting. Lying down and sleeping is very painful. Pt with history of long COVID. Because of pain she has not been able to move as much and has increased her weight. Pt states once she starts moving that it hurts less. Pt has been having incontinence issues since COVID and has seen [facility] in the past. Has been ~1 year that she has been able to walk without RW.    Pertinent History Post-COVID, cervical fusion, HTN, hypothyroidism, OSA, bradycardia    How long can you sit comfortably? n/a    How long can  you stand comfortably? Immediate standing is painful; after long period of standing it improves    How long can you walk comfortably? Initial walking seems to irritate groin most; increased walking irritates R knee    Diagnostic tests 3/11 CT: Diffuse lumbar disc & facet degeneration, widespred foraminal stenosis severe on R at L3-4 & L at L4-5/ L5-S1    Patient Stated Goals Walk without assistance (no walker or cane), work on posture (she has been leaning forward more), lose some weight    Currently in Pain? No/denies   Sitting no pain   Pain Score --   9/10 at worst with initial standing or laying down   Pain Location Pelvis    Pain Orientation Right;Anterior    Pain Descriptors / Indicators Burning;Constant    Pain Type Chronic pain    Pain  Radiating Towards anterior thigh/lateral hip    Pain Onset More than a month ago    Pain Frequency Intermittent    Aggravating Factors  Initial standing and walking, laying down    Pain Relieving Factors Sitting, long periods of standing and walking    Effect of Pain on Daily Activities Affects transfers and amb                            Objective measurements completed on examination: See above findings.               PT Education - 12/07/20 0822     Education Details Discussed exam findings and POC    Person(s) Educated Patient    Methods Explanation;Demonstration    Comprehension Verbalized understanding              PT Short Term Goals - 12/07/20 0835       PT SHORT TERM GOAL #1   Title Pt will be independent with initial HEP    Time 4    Period Weeks    Status New    Target Date 01/04/21      PT SHORT TERM GOAL #2   Title Pt will report decrease in R hip/groin pain by at least 25% with transfers    Time 4    Period Weeks    Status New    Target Date 01/04/21      PT SHORT TERM GOAL #3   Title Pt will be able to amb with rollator utilizing normal reciprocal gait pattern and upright posture    Baseline Trunk currently flexed, shuffles bilat feet    Time 4    Period Weeks    Status New    Target Date 01/04/21               PT Long Term Goals - 12/07/20 0837       PT LONG TERM GOAL #1   Title Pt will be independent with advanced HEP    Time 8    Period Weeks    Status New    Target Date 02/01/21      PT LONG TERM GOAL #2   Title Pt will be able to tolerate initiation of walking program for her goals to lose weight    Baseline Currently limited due to R knee pain and R groin pain    Time 8    Period Weeks    Status New    Target Date 02/01/21      PT LONG TERM GOAL #3   Title Pt will be  able to tolerate performing 5x STS to demo improved functional hip strength and decreased R groin pain    Baseline Increased  pain with sit<>stand, unable to tolerate performing 5    Time 8    Period Weeks    Status New    Target Date 02/01/21                    Plan - 12/07/20 0823     Clinical Impression Statement Natasha [name] is a 70 y/o F presenting to OPPT due to complaint of increased R groin pain with transfers. Pt's history is significant for post-COVID, lumbar stenosis, hypothyroidism, OA, and cervical fusion. Assessment limited due to pt guarding from pain and increased fatigue. Increased time required in between transfers and movements for pt comfort. 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 Pt had received aquatics in the past and is interested in pursuing it again. Discussed that there is currently a wait list for aquatics -- pt states she may try to call her last PT clinic to see if they have any availability. Pt states she will call back for scheduling.    Personal Factors and Comorbidities Age;Past/Current Experience;Comorbidity 1;Comorbidity 2    Comorbidities COVID (08/19/19), HTN, hypothyroidism, OSA, bradycardia, cervical fusion    Examination-Activity Limitations Bend;Locomotion Level;Bed Mobility;Transfers;Sleep;Continence;Stand    Examination-Participation Restrictions Community Activity;Cleaning;Driving;Shop;Yard Work    [employer] Evolving/Moderate complexity    Clinical Decision Making Moderate    Rehab Potential Fair    PT Frequency 2x / week    PT Duration 8 weeks    PT Treatment/Interventions ADLs/Self Care Home Management;Aquatic Therapy;Biofeedback;Cryotherapy;Electrical Stimulation;Iontophoresis 47m/ml Dexamethasone;Moist Heat;Ultrasound;DME Instruction;Gait training;Stair training;Functional mobility training;Therapeutic activities;Therapeutic exercise;Balance training;Neuromuscular  re-education;Patient/family education;Orthotic Fit/Training;Manual techniques;Vasopneumatic Device;Passive range of motion;Dry needling;Taping;Energy conservation    PT Next Visit Plan Re-Assess pelvic movement and alignment in standing. Initiate core, glute, and hip flexor & abductor strengthening. Stretch hip flexors. Follow-up on aquatics?    Consulted and Agree with Plan of Care Patient             Patient will benefit from skilled therapeutic intervention in order to improve the following deficits and impairments:  Abnormal gait,Decreased mobility,Difficulty walking,Hypomobility,Increased muscle spasms,Decreased range of motion,Obesity,Improper body mechanics,Decreased activity tolerance,Decreased strength,Increased fascial restricitons,Postural dysfunction,Pain  Visit Diagnosis: Abnormal posture  Difficulty in walking, not elsewhere classified  Muscle weakness (generalized)  Other abnormalities of gait and mobility  Pain in right hip  Stiffness of right hip, not elsewhere classified     Problem List Patient Active Problem List   Diagnosis Date Noted   Colon cancer screening 11/24/2020   Constipation 11/24/2020   Gastroesophageal reflux disease 11/24/2020   Morbid obesity ([place] 11/24/2020   Right lower quadrant pain 11/24/2020   Breast cancer screening by mammogram 11/24/2020   Chronic midline low back pain with sciatica 11/24/2020   OAB (overactive bladder) 11/24/2020   Aortic atherosclerosis ([place] 11/24/2020   Encounter to establish care 11/24/2020   Right-sided heart failure ([place] 11/24/2020   History of pulmonary embolism 02/10/2020   Bilateral leg edema 02/10/2020   Paresthesia 01/04/2020   Gait abnormality 01/04/2020   Weakness 01/04/2020   Muscle weakness (generalized) 11/09/2019   Numbness and tingling of both feet 11/09/2019   Memory loss due to medical condition 11/09/2019   Pulmonary nodule 11/09/2019   Obesity, Class III, BMI  40-49.9 (morbid  obesity) ([place]) 08/24/2019   Pulmonary embolism ([place]) 08/23/2019   Bradycardia    Acute pulmonary embolism ([place]) 08/21/2019   Pneumonia due to COVID-19 virus    OSA (obstructive sleep apnea) 06/15/2013   Sciatica of left side 12/04/2011   Allergic rhinitis 01/21/2009   Peripheral edema 12/22/2008   Edema 12/22/2008   LEG PAIN, LEFT 12/09/2008   Skin sensation disturbance 12/05/2008   Nausea and vomiting 05/23/2008   CARCINOMA, THYROID GLAND, PAPILLARY 05/04/2008   Hypothyroidism, postsurgical 05/04/2008   Hypertension 12/25/2007   Dyspnea 12/25/2007   COUGH 12/25/2007    Emmilyn Crooke April Ma L Manchester PT, DPT 12/07/2020, 8:41 AM  [facility] [place], [place], [address] Phone: [phone]   Fax:  [phone]  Name: Natasha [name] MRN: [id number] Date of Birth: 1951-06-12

## 2020-12-08 ENCOUNTER — Encounter (HOSPITAL_BASED_OUTPATIENT_CLINIC_OR_DEPARTMENT_OTHER): Payer: Self-pay | Admitting: Physical Therapy

## 2020-12-12 ENCOUNTER — Telehealth: Payer: Self-pay

## 2020-12-12 ENCOUNTER — Encounter: Payer: Self-pay | Admitting: Physician Assistant

## 2020-12-12 ENCOUNTER — Ambulatory Visit (INDEPENDENT_AMBULATORY_CARE_PROVIDER_SITE_OTHER): Payer: Medicare HMO | Admitting: Physician Assistant

## 2020-12-12 VITALS — BP 126/80 | HR 72 | Ht 64.5 in | Wt 320.0 lb

## 2020-12-12 DIAGNOSIS — Z86010 Personal history of colonic polyps: Secondary | ICD-10-CM | POA: Diagnosis not present

## 2020-12-12 DIAGNOSIS — K219 Gastro-esophageal reflux disease without esophagitis: Secondary | ICD-10-CM | POA: Diagnosis not present

## 2020-12-12 DIAGNOSIS — Z860101 Personal history of adenomatous and serrated colon polyps: Secondary | ICD-10-CM

## 2020-12-12 DIAGNOSIS — I50810 Right heart failure, unspecified: Secondary | ICD-10-CM

## 2020-12-12 DIAGNOSIS — M62830 Muscle spasm of back: Secondary | ICD-10-CM | POA: Diagnosis not present

## 2020-12-12 MED ORDER — PANTOPRAZOLE SODIUM 40 MG PO TBEC: 40.0000 mg | DELAYED_RELEASE_TABLET | Freq: Every day | ORAL | 3 refills | Status: DC

## 2020-12-12 MED ORDER — CYCLOBENZAPRINE HCL 5 MG PO TABS: 5.0000 mg | ORAL_TABLET | Freq: Every day | ORAL | 0 refills | Status: DC

## 2020-12-12 NOTE — Patient Instructions (Signed)
If you are age 70 or older, your body mass index should be between 23-30. Your Body mass index is 54.08 kg/m. If this is out of the aforementioned range listed, please consider follow up with your Primary Care Provider.  If you are age 61 or younger, your body mass index should be between 19-25. Your Body mass index is 54.08 kg/m. If this is out of the aformentioned range listed, please consider follow up with your Primary Care Provider.   We have sent the following medications to your pharmacy for you to pick up at your convenience: Cyclobenxaprine 5 mg  Please call your Primary Care Physician and see if they can get you in sooner.  Thank you for choosing me and [facility].  Ellouise Newer, PA-C

## 2020-12-12 NOTE — Addendum Note (Signed)
Addended by: Darrall Dears on: 12/12/2020 01:16 PM   Modules accepted: Orders

## 2020-12-12 NOTE — Progress Notes (Signed)
I agree with the above note, plan.  She probably is not really "due" for a surveillance colonoscopy until December 2017 based on most recent national guidelines for polyp surveillance, colon cancer screening.

## 2020-12-12 NOTE — Telephone Encounter (Signed)
   Natasha [name] [birth year] [id number]  Dear Marnee Spring ,NP:  We have scheduled the above named patient for a(n) Colonoscopy  procedure. Our records show that (s)he is on anticoagulation therapy.  Please advise as to whether the patient may come off their therapy of Xarelto 2 days days prior to their procedure which is scheduled for TBD.  Please route your response to [facility] or fax response to [phone].  Sincerely,    [facility]

## 2020-12-12 NOTE — Telephone Encounter (Signed)
   Natasha [name] 09-14-1950 [id number]  Dear Dr. Buford Dresser:  We have scheduled the above named patient for a(n) Colonoscopy  procedure. Our records show that (s)he has been seen for Bilateral leg Edema   Please advise as to whether the patient may have Colonoscopy procedure. Please route your response to [facility] or fax response to [phone].  Sincerely,    [facility]

## 2020-12-12 NOTE — Progress Notes (Signed)
Chief Complaint: History of adenomatous polyps, abdominal pain  HPI:    Natasha [name] is a 70 year old female with a past medical history as listed below including history of PE on Xarelto, previously known to Dr. Collene Mares, who was referred to me by Early, Coralee Pesa, NP for her history of adenomatous polyps and abdominal pain.      08/08/2015 colonoscopy Dr. Collene Mares with 2 diminutive polyps in the mid ascending colon, few scattered diverticula in the sigmoid colon and small internal hemorrhoids.  Pathology showed adenomatous polyps and repeat recommended in 5 years.  This was done in the hospital as patient had a history of being a difficult intubation and had sleep apnea.    11/10/2020 CT renal stone study with sigmoid diverticulosis and mild aortic atherosclerosis.    11/10/2020 CT L-spine with no acute osseous abnormality, advanced diffuse lumbar disc and facet degeneration progressed from 2019, widespread neural foraminal stenosis, severe on the right at L3-4 and on the left L4-5 and L5-S1.    11/10/2020 CMP with creatinine minimally elevated at 1.19, normal CBC and urinalysis positive for UTI.    11/24/2020 patient saw PCP.  It was noted she had an acute pulmonary embolism while in the hospital with Covid and was started on Xarelto.  She also has a cardiology referral in place due to bilateral lower extremity edema.    11/30/2020 abdominal ultrasound for right lower quadrant pain radiating to the groin with no significant sonographic abnormality.    Today, the patient tells me that she has an acute issue with what feels like "contractions/spasms".  This has been occurring ever since 12/08/2020 and she has been just telling herself that "I can make it to my doctor's appointment on Tuesday".  Describes that these occur about every 5 to 10 minutes and can last for 10 to 15 seconds and just feels like something tightens around her entire abdomen and back and then releases.  Tells me this is worse with movement like when  she tries to get up from sitting.  If she can get up and start walking for a while then this seems to make things feel a little bit better.  Describes squeezing pain is a 10/10 when it is occurring but it quickly lets up.  Nothing else seems to help.   Tells me that she has been referred to a neurologist given all of her new back issues as above seen on recent CT.  She was started on Gabapentin but never started this medication.  Also tells me that she has been having some bilateral lower extremity edema and has follow-up with her cardiologist in May. Describes normal bowel habits and no blood in her stool.    Does describe some reflux symptoms.  Apparently had COVID about a year ago and was started on Pantoprazole once a day which really helped with this, but she is no longer on this medication.    Also describes that she was started on Xarelto for a "very large PE" identified when she was in the hospital for Covid a year ago and she was told to never stop her Xarelto.    Denies fever, chills, change in bowel habits or weight loss.  Past Medical History:  Diagnosis Date  . Bradycardia   . 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  Colon polyps   . Complication of anesthesia    trouble with airway  . COVID-19 08/19/2019  . Edema 12/22/2008  . History of 2019 novel coronavirus disease (COVID-19) 11/09/2019  . History of COVID-19 08/2019  . HYPERTENSION 12/25/2007  . HYPOTHYROIDISM, POSTSURGICAL 05/04/2008  . LEG PAIN, LEFT 12/09/2008  . Muscle weakness   . Numbness and tingling   . OSA (obstructive sleep apnea) 06/15/2013    Past Surgical History:  Procedure Laterality Date  . ABDOMINAL HYSTERECTOMY    . ANKLE SURGERY    . CERVICAL FUSION     x 2  . COLONOSCOPY WITH PROPOFOL N/A 08/08/2015   Procedure: COLONOSCOPY WITH PROPOFOL;  Surgeon:  Juanita Craver, MD;  Location: [facility];  Service: Endoscopy;  Laterality: N/A;  . KNEE SURGERY    . TOTAL THYROIDECTOMY      Current Outpatient Medications  Medication Sig Dispense Refill  . amLODipine (NORVASC) 5 MG tablet Take 1 tablet (5 mg total) by mouth at bedtime. 90 tablet 1  . gabapentin (NEURONTIN) 100 MG capsule For back pain. Take 100mg  (1 capsule) in the morning and 100mg  (1 capsule) in the afternoon for back pain. May take 100-300mg  (1-3 capusules) at bedtime- start with lowest dose and increase if necessary. 90 capsule 3  . Multiple Vitamin (MULTIVITAMIN WITH MINERALS) TABS tablet Take 1 tablet by mouth daily.    Marland Kitchen MYRBETRIQ 25 MG TB24 tablet Take 1 tablet (25 mg total) by mouth daily. 30 tablet 4  . NOREL AD 4-10-325 MG TABS Take 1 tablet by mouth 4 (four) times daily.    . predniSONE (DELTASONE) 50 MG tablet Take 1 tablet (50 mg total) by mouth daily with breakfast. Start on 11/26/2019 5 tablet 0  . rivaroxaban (XARELTO) 20 MG TABS tablet Take 1 tablet (20 mg total) by mouth daily with supper. 30 tablet 0  . SYNTHROID 112 MCG tablet Take 1 tablet (112 mcg total) by mouth every morning. 30 tablet 3  . valsartan-hydrochlorothiazide (DIOVAN-HCT) 160-12.5 MG tablet Take 1 tablet by mouth daily. 90 tablet 1   No current facility-administered medications for this visit.    Allergies as of 12/12/2020 - Review Complete 12/08/2020  Allergen Reaction Noted  . Codeine Shortness Of Breath, Palpitations, and Other (See Comments) 08/08/2015  . Eggs or egg-derived products Nausea And Vomiting 07/22/2018    Family History  Problem Relation Age of Onset  . Heart disease Mother   . Kidney disease Mother   . Other Father        unsure of medical history    Social History   Socioeconomic History  . Marital status: Widowed    Spouse name: Not on file  . Number of children: 3  . Years of education: college  . Highest education level: Master's degree (e.g., MA, MS, MEng, MEd,  MSW, MBA)  Occupational History  . Occupation: Teacher - [employer]  Tobacco Use  . Smoking status: Never Smoker  . Smokeless tobacco: Never Used  Vaping Use  . Vaping Use: Never used  Substance and Sexual Activity  . Alcohol use: Yes    Comment: 5 drinks/year  . Drug use: No  . Sexual activity: Not Currently  Other Topics Concern  . Not on file  Social History Narrative   Lives alone.   Right-handed.   No daily caffeine use.   Social Determinants of Health   Financial Resource Strain: Not on file  Food Insecurity: Not on file  Transportation Needs: Not on  file  Physical Activity: Not on file  Stress: Not on file  Social Connections: Not on file  Intimate Partner Violence: Not on file    Review of Systems:    Constitutional: No weight loss, fever or chills Skin: No rash Cardiovascular: No chest pain, chest pressure or palpitations   Respiratory: +SOB Gastrointestinal: See HPI and otherwise negative Genitourinary: No dysuria  Neurological: No headache, dizziness or syncope Musculoskeletal: +back pain Hematologic: No bleeding or bruising Psychiatric: No history of depression or anxiety   Physical Exam:  Vital signs: BP 126/80 (BP Location: Left Arm, Patient Position: Sitting, Cuff Size: Large)   Pulse 72   Ht 5' 4.5" (1.638 m) Comment: height measured without shoes  Wt (!) 320 lb (145.2 kg)   BMI 54.08 kg/m   Constitutional:   Pleasant morbidly obese AA female appears to be in mild distress, Well developed, Well nourished, alert and cooperative Head:  Normocephalic and atraumatic. Eyes:   PEERL, EOMI. No icterus. Conjunctiva pink. Ears:  Normal auditory acuity. Neck:  Supple Throat: Oral cavity and pharynx without inflammation, swelling or lesion.  Respiratory: Respirations even and unlabored. Lungs clear to auscultation bilaterally.   No wheezes, crackles, or rhonchi.  Cardiovascular: Normal S1, S2. No MRG. Regular rate and rhythm. No peripheral edema,  cyanosis or pallor.  Gastrointestinal:  Soft, nondistended, nontender. No rebound or guarding. Normal bowel sounds. No appreciable masses or hepatomegaly. Rectal:  Not performed.  Msk:  Symmetrical without gross deformities. Without edema, no deformity or joint abnormality. +ttp over thoracic and lumbar spine +uses walker Neurologic:  Alert and  oriented x4;  grossly normal neurologically.  Skin:   Dry and intact without significant lesions or rashes. Psychiatric: Demonstrates good judgement and reason without abnormal affect or behaviors.  RELEVANT LABS AND IMAGING: CBC    Component Value Date/Time   WBC 7.9 11/10/2020 1548   RBC 4.25 11/10/2020 1548   HGB 12.5 11/10/2020 1548   HCT 38.6 11/10/2020 1548   PLT 268 11/10/2020 1548   MCV 90.8 11/10/2020 1548   MCV 86.5 12/31/2015 1152   MCH 29.4 11/10/2020 1548   MCHC 32.4 11/10/2020 1548   RDW 14.1 11/10/2020 1548   LYMPHSABS 2.2 11/10/2020 1548   MONOABS 0.8 11/10/2020 1548   EOSABS 0.2 11/10/2020 1548   BASOSABS 0.1 11/10/2020 1548    CMP     Component Value Date/Time   NA 140 11/10/2020 1548   NA 141 12/16/2019 1037   K 4.4 11/10/2020 1548   CL 107 11/10/2020 1548   CO2 24 11/10/2020 1548   GLUCOSE 96 11/10/2020 1548   BUN 16 11/10/2020 1548   BUN 20 12/16/2019 1037   CREATININE 1.19 (H) 11/10/2020 1548   CREATININE 0.74 12/31/2015 1145   CALCIUM 9.0 11/10/2020 1548   PROT 7.3 11/10/2020 1548   ALBUMIN 3.9 11/10/2020 1548   AST 17 11/10/2020 1548   ALT 10 11/10/2020 1548   ALKPHOS 74 11/10/2020 1548   BILITOT 0.5 11/10/2020 1548   GFRNONAA 49 (L) 11/10/2020 1548   GFRAA 76 12/16/2019 1037    Assessment: 1.  Back spasms?:  Patient experiencing spasms in clinic, visibly having to hold her breath and tense up, most likely these are coming from her back as she has no GI symptoms at all 2.  History of adenomatous polyps: Last colonoscopy in 2016 with recommendations for repeat in 5 years 3.  Peripheral edema:  Likely due to known heart failure, has upcoming appointment with cardiologist 4.  Chronic anticoagulation with Xarelto for PE  Plan: 1.  Discussed with patient that she has a lot of acute health issues going on right now including possible worsening of her heart failure and back problems which are causing this acute spasms.  I do not feel like now is the right time for her to proceed with a surveillance colonoscopy.  Discussed that possibly if we can get everything under control and she sees her cardiologist and we are allowed to hold her Xarelto then we can schedule it within the next 6 months or so.  Patient is somewhat concerned that something is going on her abdomen, but she has normal bowel movements, no blood in her stool, no weight loss and no alarm GI symptoms. 2.  If and when patient schedules a colonoscopy this would need to be in the hospital given her morbid obesity.  She is at increased risk for this procedure. 3.  Today in clinic patient experiencing what I believe are acute back spasms, prescribed Cyclobenzaprine 5 mg once daily for the next 3 weeks #15 with no refills.  Did discuss with the patient that I would urge her to follow-up with her primary care provider sooner than what is scheduled on April 22 to see if there is anything else they can offer her.  If these get worse would recommend that she go to the ER/urgent care. 4.  Refilled Pantoprazole 40 mg once daily, 30-60 minutes before breakfast.  #30 with 5 refills. 5.  We will go ahead and send cardiac clearance to her cardiologist who she is going to see shortly to ensure that they feel it is safe for her to be under anesthesia.  We will also send clearance for her Xarelto to hold for 2 days prior to time of procedure. 6.  Scheduled patient a follow-up appointment with Dr. Ardis Hughs in the next 2 to 3 months so that he can see where things are and decide if she would be a good candidate for colonoscopy or not.  Ellouise Newer,  PA-C [facility] 12/12/2020, 11:00 AM  Cc: Orma Render, NP

## 2020-12-13 ENCOUNTER — Telehealth (HOSPITAL_BASED_OUTPATIENT_CLINIC_OR_DEPARTMENT_OTHER): Payer: Self-pay | Admitting: Nurse Practitioner

## 2020-12-13 ENCOUNTER — Telehealth (HOSPITAL_BASED_OUTPATIENT_CLINIC_OR_DEPARTMENT_OTHER): Payer: Self-pay

## 2020-12-13 ENCOUNTER — Encounter (HOSPITAL_BASED_OUTPATIENT_CLINIC_OR_DEPARTMENT_OTHER): Payer: Self-pay | Admitting: Nurse Practitioner

## 2020-12-13 NOTE — Telephone Encounter (Signed)
Called patient re: neurology appointment.  Patient stated she missed a call from neurology and would call them back today to schedule the appointment.  She scheduled a follow up virtual appointment with Worthy Keeler, AGNP on 4/14 @ 1:10pm.

## 2020-12-13 NOTE — Telephone Encounter (Signed)
I received a message from GI that Natasha [name] had been seen for colonoscopy evaluation and they were concerned over the amount of pain she is in. It looks like she is scheduled to see me on the 22nd.   Please call her to see if neurology has reached out to her yet and if she has been able to get an appointment to be seen about her back.  If she would like to come in to see me sooner, we can certainly move her up on the schedule-she will need a 40 minute slot due to her complexities.   Thank you!

## 2020-12-14 ENCOUNTER — Other Ambulatory Visit: Payer: Self-pay

## 2020-12-14 ENCOUNTER — Encounter (HOSPITAL_BASED_OUTPATIENT_CLINIC_OR_DEPARTMENT_OTHER): Payer: Self-pay | Admitting: Nurse Practitioner

## 2020-12-14 ENCOUNTER — Telehealth (HOSPITAL_BASED_OUTPATIENT_CLINIC_OR_DEPARTMENT_OTHER): Payer: Self-pay | Admitting: Nurse Practitioner

## 2020-12-14 ENCOUNTER — Telehealth (INDEPENDENT_AMBULATORY_CARE_PROVIDER_SITE_OTHER): Payer: Medicare HMO | Admitting: Nurse Practitioner

## 2020-12-14 DIAGNOSIS — M5440 Lumbago with sciatica, unspecified side: Secondary | ICD-10-CM

## 2020-12-14 DIAGNOSIS — R109 Unspecified abdominal pain: Secondary | ICD-10-CM | POA: Insufficient documentation

## 2020-12-14 DIAGNOSIS — H109 Unspecified conjunctivitis: Secondary | ICD-10-CM | POA: Insufficient documentation

## 2020-12-14 DIAGNOSIS — N3281 Overactive bladder: Secondary | ICD-10-CM | POA: Diagnosis not present

## 2020-12-14 DIAGNOSIS — M5432 Sciatica, left side: Secondary | ICD-10-CM | POA: Diagnosis not present

## 2020-12-14 DIAGNOSIS — G8929 Other chronic pain: Secondary | ICD-10-CM | POA: Diagnosis not present

## 2020-12-14 DIAGNOSIS — B9689 Other specified bacterial agents as the cause of diseases classified elsewhere: Secondary | ICD-10-CM | POA: Insufficient documentation

## 2020-12-14 DIAGNOSIS — R202 Paresthesia of skin: Secondary | ICD-10-CM

## 2020-12-14 DIAGNOSIS — M62830 Muscle spasm of back: Secondary | ICD-10-CM | POA: Insufficient documentation

## 2020-12-14 HISTORY — DX: Unspecified abdominal pain: R10.9

## 2020-12-14 HISTORY — DX: Other specified bacterial agents as the cause of diseases classified elsewhere: B96.89

## 2020-12-14 HISTORY — DX: Unspecified conjunctivitis: H10.9

## 2020-12-14 HISTORY — DX: Muscle spasm of back: M62.830

## 2020-12-14 MED ORDER — POLYMYXIN B-TRIMETHOPRIM 10000-0.1 UNIT/ML-% OP SOLN: 1.0000 [drp] | Freq: Four times a day (QID) | OPHTHALMIC | 0 refills | Status: AC

## 2020-12-14 MED ORDER — GABAPENTIN 100 MG PO CAPS: ORAL_CAPSULE | ORAL | 3 refills | Status: DC

## 2020-12-14 NOTE — Assessment & Plan Note (Signed)
New onset of spasms from the lumbar region with radiation bilaterally around to the abdomen. Limitations of telephone visit prevent physical evaluation, however, based on review of record from recent visit to GI, urology evaluation, physical evaluation by myself at recent visit, and imaging results, the origin of the pain appears to be from the spinal stenosis.  Limitations for treatment include age and co-morbid conditions. I do not feel that narcotics would be beneficial for this type of pain and would increase the risk of falls and further complications.  She is working with PT currently, but her pain will certainly limit the amount of progression that she may have. Steroid burst recently was not helpful for her symptoms.  Discussed with the patient that options are very limited at this time, but I do feel that neurology evaluation would be quite beneficial to discuss options for possible interventions that may benefit her.  For now, recommend that she increase gabapentin to 200mg , with the option to increase to 300mg  every 8 hours. Discussed that she should NOT take gabapentin at the same time as cyclobenzaprine and recommend cyclobenzaprine only at bedtime to reduce sedative effect and risk of falls.  Discussed the sedative effects of gabapentin with recommendations not to drive while taking this medication and to let me know immediately if she is experiencing excessive sleepiness.  With her kidney function, we will avoid NSAIDs at this time.  Recommend heating pad to the back for 20 minutes at a time several times a day to see if this is helpful in relief of spasms.  I will place a call to [facility] to see if we can get her in urgently to be seen.  Encouraged the patient to call or send MyChart message if her pain does not improve, worsens, or if she begins to develop any additional symptoms.

## 2020-12-14 NOTE — Assessment & Plan Note (Signed)
>>  ASSESSMENT AND PLAN FOR OAB (OVERACTIVE BLADDER) WRITTEN ON 12/14/2020  9:49 AM BY Undrea Archbold E, NP  Symptoms of overactive bladder currently on treatment with vebregon 75mg  per day.  Reported improved symptoms with sudden worsening on Tuesday are concerning for possible neurological involvement from her spine, however, these seem to have improved at this time.  She is not experiencing and incontinence of bowels.  Recommend continue medication at current dose and will place call to [facility] to see if she can be seen in the near future.  Discussed warning signs that would warrant immediate evaluation such as sudden loss of bowel or bladder control or new lower extremity numbness or weakness.

## 2020-12-14 NOTE — Assessment & Plan Note (Addendum)
Ongoing low back pain with sciatica in the setting of widespread neural foraminal stenosis of the lower spine.  See note plan for spasms of lower back for complete recommendations.

## 2020-12-14 NOTE — Assessment & Plan Note (Signed)
Bilateral paresthesias in the lower extremities in the setting of widespread neural foraminal stenosis of the lumbar and sacral areas.  Her pain does appear to be worsening with new symptoms presenting late last week.  Plan to increase gabapentin to 200-300mg  every 8 hours to see if this is helpful for her symptoms.  Referral to neurology placed- will call today to see if we can expedite her visit given her advanced symptoms and severe pain.

## 2020-12-14 NOTE — Assessment & Plan Note (Signed)
Ongoing sciatica in the setting of widespread neural foraminal stenosis of the lower spine.  See note plan for spasms of lower back for complete recommendations.

## 2020-12-14 NOTE — Telephone Encounter (Signed)
Called [facility] to schedule an appointment for the patient.  Spoke to [place] and the appointment is scheduled for 6/30 @ 11am and check-in at 10:30am.  Lvm for patient to call back.

## 2020-12-14 NOTE — Assessment & Plan Note (Signed)
Symptoms of overactive bladder currently on treatment with vebregon 75mg  per day.  Reported improved symptoms with sudden worsening on Tuesday are concerning for possible neurological involvement from her spine, however, these seem to have improved at this time.  She is not experiencing and incontinence of bowels.  Recommend continue medication at current dose and will place call to [facility] to see if she can be seen in the near future.  Discussed warning signs that would warrant immediate evaluation such as sudden loss of bowel or bladder control or new lower extremity numbness or weakness.

## 2020-12-14 NOTE — Patient Instructions (Signed)
I will place a call to [facility] to see if we can get you an appointment set up soon.   For now I would like you to:  Take 2 capsules (200mg ) of gabapentin every 8 hours.  You may increase this to 3 capsules (300mg ) if 200mg  dose is not effective.  This medication can make you very sleepy in higher doses, so monitor for this and reduce the dose during the day if you become too sleepy.  I do not recommend that you drive while taking this medication until you know how it affects you.   Monitor for any other symptoms and let me know if you begin to experience new or worsening symptoms.   Take cyclobenzaprine 5mg  tab at bedtime.  Do not take gabapentin and cyclobenzaprine together- as this can cause increased sedation.  This medication can make you very sleepy.  Do not drive while taking this medication and for at least 8 hours after your last dose.   Use a heating pad on your lower back for 20 minutes at a time up to every 2 hours to help with spasms.  You may also try using ice on your lower back for 20 minutes at a time up to every 2 hours if this is helpful.   Avoid prolonged sitting. If you are not moving around, I recommend that you try to lay back and keep your legs elevated to help reduce the stress on your lower back.   Avoid lifting objects heavier than a gallon of milk at this time.   While laying down, you may try placing a small pillow in the lower part of the back to help with stretching of this part of the spine. If this is painful, then discontinue.   Please call me if you have any new or worsening symptoms. Over the weekend, please reply to the MyChart message I have sent to ensure that the message will come to me and not to the inbasket that I wont be able to see until Monday.   If you have any symptoms of worsening urinary incontinence, with lack of bladder control,  or begin to have bowel incontinence, please seek emergency care immediately.

## 2020-12-14 NOTE — Assessment & Plan Note (Addendum)
Symptoms and presentation consistent with bacterial conjunctivitis. No signs of additional infection present.  Will plan to treat with antibiotic drops every 6 hours for 5 days. If symptoms not improved or worsen, patient will call back.

## 2020-12-14 NOTE — Progress Notes (Signed)
Virtual Visit via Telephone Note  I connected with  Natasha [name] on 12/14/20 at  1:10 PM EDT by telephone and verified that I am speaking with the correct person using two identifiers.   I discussed the limitations, risks, security and privacy concerns of performing an evaluation and management service by telephone and the availability of in person appointments. I also discussed with the patient that there may be a patient responsible charge related to this service. The patient expressed understanding and agreed to proceed.  Participating parties included in this telephone visit include: The patient and the nurse practitioner listed.  The patient is: At home I am: In the office  Subjective:    CC: Back Pain  HPI: Natasha [name] is a 70 y.o. year old female presenting today via telephone visit to discuss ongoing pain. She has been experiencing frequent and prolonged pain in her back, flank, and abdomen with radiation for some time now. She has been evaluated by urology and no known urologic causes have been found. She had a CT 11/10/2020 which revealed advanced diffuse lumbar disc and facet degeneration overall and widespread neural foraminal stenosis severe on the right at L3-4 and on the left at L4-5 and L5-S1. She has been going to physical therapy and was recently started on gabapentin 100-200mg  twice a day.   She reports that last Thursday she also began to experience spasms and squeezing around the mid section of her body bilaterally that feel as though they are coming from the back. She states the spasms are frequent throughout the day and 10/10 in pain. She tells me that she is also still experiencing the pain from her right flank radiating down into her right groin and thigh. She tells me this is 10/10. She tells me she is "miserable" and this is completely different from her norm. She also says that she has never been in pain like this before and her pain is making it difficult for her  to function.    She was seen on Tuesday of this week by GI for colonoscopy evaluation, at the time of the visit she was experiencing these spasms and the provider wrote for her to have cyclobenzaprine (5mg ) once a day for the pain. She tells me after that appointment she did go home and took one cyclobenzaprine and 1 gabapentin (100mg ) and subsequently fell asleep on the toilet. Her grandson found her and helped her to bed. She reports that she is unsure if this helped with her pain or just made her sleep.   She also reports that she has been taking the vibegron [place]) 75mg  daily and this had been working very well for her incontinence, but on Tuesday night it seemed to stop working well for her and she experienced 4 incontinent episodes during the night that required clothing change despite her wearing an incontinence brief and pad. She denies any missed doses or changes in her regimen that may have exacerbated this. She denies any other urinary symptoms such as dysuria, hematuria, or malodor.   She has been referred to neurology for evaluation and did receive a call from [facility], but missed the call and tells me she has been unable to get in touch with them again. She would like for me to see if I can get in touch with them to reach out to her.   Additionally, she tells me she has been experiencing burning, itching, and redness that started in one eye of her  eyes but has now moved bilaterally. She reports when awakening her eyes have a thick mucous coating and are watery during the day. She has not been using anything for this. She is not experiencing any upper respiratory symptoms at this time or fevers.   Past medical history, Surgical history, Family history not pertinant except as noted below, Social history, Allergies, and medications have been entered into the medical record, reviewed, and corrections made.   Review of Systems:  All review of systems negative except  what is listed in the HPI  Objective:    General:  Patient speaking clearly in complete sentences with intermittent gasps and cries in pain.  No shortness of breath noted.   Alert and oriented x3.   Normal judgment.  She does appear to be in distress related to her back pain.   Impression and Recommendations:    1. Chronic midline low back pain with sciatica, sciatica laterality unspecified - gabapentin (NEURONTIN) 100 MG capsule; For pain. Take 200mg  to 300mg  (2-3 capsules) every 8 hours for pain. May cause drowsiness. Do not take at the same time as cyclobenzaprine (Flexeril).  Dispense: 270 capsule; Refill: 3 Ongoing low back pain with sciatica in the setting of widespread neural foraminal stenosis of the lower spine.  See note plan for spasms of lower back for complete recommendations.   2. Sciatica of left side - gabapentin (NEURONTIN) 100 MG capsule; For pain. Take 200mg  to 300mg  (2-3 capsules) every 8 hours for pain. May cause drowsiness. Do not take at the same time as cyclobenzaprine (Flexeril).  Dispense: 270 capsule; Refill: 3 Ongoing sciatica in the setting of widespread neural foraminal stenosis of the lower spine.  See note plan for spasms of lower back for complete recommendations.   3. Paresthesia - gabapentin (NEURONTIN) 100 MG capsule; For pain. Take 200mg  to 300mg  (2-3 capsules) every 8 hours for pain. May cause drowsiness. Do not take at the same time as cyclobenzaprine (Flexeril).  Dispense: 270 capsule; Refill: 3 Bilateral paresthesias in the lower extremities in the setting of widespread neural foraminal stenosis of the lumbar and sacral areas.  Her pain does appear to be worsening with new symptoms presenting late last week.  Plan to increase gabapentin to 200-300mg  every 8 hours to see if this is helpful for her symptoms.  Referral to neurology placed- will call today to see if we can expedite her visit given her advanced symptoms and severe pain.   4. Bacterial  conjunctivitis of both eyes - trimethoprim-polymyxin b (POLYTRIM) ophthalmic solution; Place 1 drop into both eyes every 6 (six) hours for 5 days.  Dispense: 10 mL; Refill: 0 Symptoms and presentation consistent with bacterial conjunctivitis. No signs of additional infection present.  Will plan to treat with antibiotic drops every 6 hours for 5 days. If symptoms not improved or worsen, patient will call back.   5. Spasm of muscle of lower back - gabapentin (NEURONTIN) 100 MG capsule; For pain. Take 200mg  to 300mg  (2-3 capsules) every 8 hours for pain. May cause drowsiness. Do not take at the same time as cyclobenzaprine (Flexeril).  Dispense: 270 capsule; Refill: 3 New onset of spasms from the lumbar region with radiation bilaterally around to the abdomen. Limitations of telephone visit prevent physical evaluation, however, based on review of record from recent visit to GI, urology evaluation, physical evaluation by myself at recent visit, and imaging results, the origin of the pain appears to be from the spinal stenosis.  Limitations for treatment include  age and co-morbid conditions. I do not feel that narcotics would be beneficial for this type of pain and would increase the risk of falls and further complications.  She is working with PT currently, but her pain will certainly limit the amount of progression that she may have. Steroid burst recently was not helpful for her symptoms.  Discussed with the patient that options are very limited at this time, but I do feel that neurology evaluation would be quite beneficial to discuss options for possible interventions that may benefit her.  For now, recommend that she increase gabapentin to 200mg , with the option to increase to 300mg  every 8 hours. Discussed that she should NOT take gabapentin at the same time as cyclobenzaprine and recommend cyclobenzaprine only at bedtime to reduce sedative effect and risk of falls.  Discussed the sedative effects of  gabapentin with recommendations not to drive while taking this medication and to let me know immediately if she is experiencing excessive sleepiness.  With her kidney function, we will avoid NSAIDs at this time.  Recommend heating pad to the back for 20 minutes at a time several times a day to see if this is helpful in relief of spasms.  I will place a call to [facility] to see if we can get her in urgently to be seen.  Encouraged the patient to call or send MyChart message if her pain does not improve, worsens, or if she begins to develop any additional symptoms.   6. Obesity, Class III, BMI 40-49.9 (morbid obesity) (HCC) Increased body mass most likely affecting pain radiating from the lower back. At this time, her pain severely limits her ability to exercise and needs to be controlled prior to any aggressive treatment.  She is working with PT currently.  I am hopeful that we can get her pain under better control and work towards improved mobility so that she can achieve her current weight loss goal of 50 pounds.  . 7. OAB (overactive bladder) Symptoms of overactive bladder currently on treatment with vebregon 75mg  per day.  Reported improved symptoms with sudden worsening on Tuesday are concerning for possible neurological involvement from her spine, however, these seem to have improved at this time.  She is not experiencing and incontinence of bowels.  Recommend continue medication at current dose and will place call to [facility] to see if she can be seen in the near future.  Discussed warning signs that would warrant immediate evaluation such as sudden loss of bowel or bladder control or new lower extremity numbness or weakness.        I discussed the assessment and treatment plan with the patient. The patient was provided an opportunity to ask questions and all were answered. The patient agreed with the plan and demonstrated an understanding of  the instructions.   The patient was advised to call back or seek an in-person evaluation if the symptoms worsen or if the condition fails to improve as anticipated.  I provided 39 minutes of non-face-to-face time during this TELEPHONE encounter.    Orma Render, NP ;

## 2020-12-14 NOTE — Assessment & Plan Note (Signed)
Increased body mass most likely affecting pain radiating from the lower back. At this time, her pain severely limits her ability to exercise and needs to be controlled prior to any aggressive treatment.  She is working with PT currently.  I am hopeful that we can get her pain under better control and work towards improved mobility so that she can achieve her current weight loss goal of 50 pounds.

## 2020-12-14 NOTE — Telephone Encounter (Signed)
Please call [facility] to see if we can get this patient seen Kristien Salatino next week for severe pain in the setting of degenerative lumbar disease and stenosis.

## 2020-12-18 ENCOUNTER — Other Ambulatory Visit (HOSPITAL_BASED_OUTPATIENT_CLINIC_OR_DEPARTMENT_OTHER): Payer: Self-pay | Admitting: Nurse Practitioner

## 2020-12-18 ENCOUNTER — Telehealth (HOSPITAL_BASED_OUTPATIENT_CLINIC_OR_DEPARTMENT_OTHER): Payer: Self-pay

## 2020-12-18 ENCOUNTER — Telehealth (HOSPITAL_BASED_OUTPATIENT_CLINIC_OR_DEPARTMENT_OTHER): Payer: Self-pay | Admitting: Nurse Practitioner

## 2020-12-18 DIAGNOSIS — R200 Anesthesia of skin: Secondary | ICD-10-CM

## 2020-12-18 DIAGNOSIS — M6281 Muscle weakness (generalized): Secondary | ICD-10-CM

## 2020-12-18 DIAGNOSIS — M5440 Lumbago with sciatica, unspecified side: Secondary | ICD-10-CM

## 2020-12-18 DIAGNOSIS — R202 Paresthesia of skin: Secondary | ICD-10-CM

## 2020-12-18 DIAGNOSIS — R269 Unspecified abnormalities of gait and mobility: Secondary | ICD-10-CM

## 2020-12-18 DIAGNOSIS — M48061 Spinal stenosis, lumbar region without neurogenic claudication: Secondary | ICD-10-CM

## 2020-12-18 DIAGNOSIS — G8929 Other chronic pain: Secondary | ICD-10-CM

## 2020-12-18 DIAGNOSIS — M5432 Sciatica, left side: Secondary | ICD-10-CM

## 2020-12-18 DIAGNOSIS — M79652 Pain in left thigh: Secondary | ICD-10-CM

## 2020-12-18 NOTE — Telephone Encounter (Signed)
Spoke with neurology and they recommended she be seen by Neurosurgery as they would best be able to evaluate need for injection vs. Other treatment options. First available neurology appointment 03/01/2021. Please call patient and let her know that I am sending in referral for neurosurgery at [facility]. For now I will keep her appointment with neurology in the event we cannot get her into neurosurgery sooner.

## 2020-12-18 NOTE — Telephone Encounter (Signed)
Called patient to inform her of the appointment scheduled with [facility] on 03/01/21 @ 11am and check-in at 10:30am.  Patient was also made aware of the referral made to [facility].

## 2020-12-22 ENCOUNTER — Encounter (HOSPITAL_BASED_OUTPATIENT_CLINIC_OR_DEPARTMENT_OTHER): Payer: Self-pay | Admitting: Nurse Practitioner

## 2020-12-22 ENCOUNTER — Telehealth (INDEPENDENT_AMBULATORY_CARE_PROVIDER_SITE_OTHER): Payer: Medicare HMO | Admitting: Nurse Practitioner

## 2020-12-22 ENCOUNTER — Other Ambulatory Visit: Payer: Self-pay

## 2020-12-22 DIAGNOSIS — M5440 Lumbago with sciatica, unspecified side: Secondary | ICD-10-CM

## 2020-12-22 DIAGNOSIS — R0600 Dyspnea, unspecified: Secondary | ICD-10-CM

## 2020-12-22 DIAGNOSIS — R200 Anesthesia of skin: Secondary | ICD-10-CM

## 2020-12-22 DIAGNOSIS — G8929 Other chronic pain: Secondary | ICD-10-CM | POA: Diagnosis not present

## 2020-12-22 DIAGNOSIS — M25559 Pain in unspecified hip: Secondary | ICD-10-CM

## 2020-12-22 DIAGNOSIS — I10 Essential (primary) hypertension: Secondary | ICD-10-CM

## 2020-12-22 DIAGNOSIS — M62830 Muscle spasm of back: Secondary | ICD-10-CM

## 2020-12-22 DIAGNOSIS — M6281 Muscle weakness (generalized): Secondary | ICD-10-CM

## 2020-12-22 DIAGNOSIS — R071 Chest pain on breathing: Secondary | ICD-10-CM

## 2020-12-22 DIAGNOSIS — R269 Unspecified abnormalities of gait and mobility: Secondary | ICD-10-CM | POA: Diagnosis not present

## 2020-12-22 DIAGNOSIS — R202 Paresthesia of skin: Secondary | ICD-10-CM

## 2020-12-22 DIAGNOSIS — R0609 Other forms of dyspnea: Secondary | ICD-10-CM

## 2020-12-22 DIAGNOSIS — R109 Unspecified abdominal pain: Secondary | ICD-10-CM

## 2020-12-22 DIAGNOSIS — G4733 Obstructive sleep apnea (adult) (pediatric): Secondary | ICD-10-CM

## 2020-12-22 DIAGNOSIS — R600 Localized edema: Secondary | ICD-10-CM

## 2020-12-22 HISTORY — DX: Pain in unspecified hip: M25.559

## 2020-12-22 MED ORDER — NAPROXEN 500 MG PO TABS: 500.0000 mg | ORAL_TABLET | Freq: Two times a day (BID) | ORAL | 0 refills | Status: DC

## 2020-12-22 MED ORDER — FUROSEMIDE 20 MG PO TABS: 20.0000 mg | ORAL_TABLET | Freq: Two times a day (BID) | ORAL | 3 refills | Status: DC

## 2020-12-22 NOTE — Progress Notes (Signed)
Virtual Visit via Telephone Note  I connected with  Natasha [name] on 12/22/20 at  3:30 PM EDT by telephone and verified that I am speaking with the correct person using two identifiers.   I discussed the limitations, risks, security and privacy concerns of performing an evaluation and management service by telephone and the availability of in person appointments. I also discussed with the patient that there may be a patient responsible charge related to this service. The patient expressed understanding and agreed to proceed.  Participating parties included in this telephone visit include: The patient and the nurse practitioner listed.  The patient is: At home I am: In the office  Subjective:    CC: Ongoing pain  HPI: Natasha [name] is a 70 y.o. year old female presenting today via telephone visit to discuss ongoing pain. She has been experiencing frequent and prolonged pain in her back, flank, and abdomen with radiation for some time now with little relief with multiple treatments attempted. She has also began to experience what she describes as "spasms" squeezing around the mid section of her body bilaterally. She tells me today that she feels as though they may be worse with deep breaths and she is wondering if she could have pleuracy.   She has tried flexeril, gabapentin, tylenol, ibuprofen, and steroids with very little effectiveness. She has not been able to tolerate flexeril due to sleepiness and she is not sure that the gabapentin has helped other than to put her to sleep as well. She reports no change with the steroids. She does have some relief from tylenol and ibuprofen taken together, but she still wakes up with pain in the middle of the night when the dose wears off.   She does report that she feels the pain around her midsection is a little better when she is up and moving around and is worse when she is still. She is limited in her mobility due to her chronic medical conditions,  decondition, and spinal issues. She has been going to physical therapy, but tells me that she feels aquatic therapy would be better for her. She reports that [facility] only open on Friday for therapy and they are on a waiting list at this time. She does have a former relationship with [facility] and they do have a pool. She requests a referral there to see if they can get her in for aquatic PT.   She also reports that the edema in her legs is worse over the past few days. She states that she has not been taking her lasix and wants to know if she can begin taking this again.   She denies chest pain, palpitations, dizziness, lightheadedness, frothy sputum, or productive cough. She does have dyspnea on exertion at baseline.   Past medical history, Surgical history, Family history not pertinant except as noted below, Social history, Allergies, and medications have been entered into the medical record, reviewed, and corrections made.   Review of Systems:  All review of systems negative except what is listed in the HPI  Objective:    General:  Patient speaking clearly in complete sentences. Shortness of breath noted when moving around while speaking, but none at baseline. No evidence of crackles or cough present.  Alert and oriented x3.   Normal judgment.  No apparent acute distress. She does sound less distressed than at her last visit.   Impression and Recommendations:    1. Essential hypertension - furosemide (LASIX)  20 MG tablet; Take 1 tablet (20 mg total) by mouth 2 (two) times daily.  Dispense: 60 tablet; Refill: 3 - Ambulatory referral to Physical Therapy - Amb Ref to [facility]  2. Bilateral leg edema - furosemide (LASIX) 20 MG tablet; Take 1 tablet (20 mg total) by mouth 2 (two) times daily.  Dispense: 60 tablet; Refill: 3 - Ambulatory referral to Physical Therapy - Amb Ref to Medical Weight Management Refills provided on lasix today- not taking  recently and edema has worsening.  No alarm symptoms present today although evaluation limited through phone encounter.  Recommendations to take in morning and again late afternoon to prevent interruption with sleep. I am hopeful that we can get her in with aquatic therapy at [facility], where she has a history of being a patient, since we have a waiting list for this at [facility]. I feel that getting her moving will help with her pain, edema, and weight loss.  Will plan for kidney function monitoring in 2-3 weeks.   3. Other chronic pain - naproxen (NAPROSYN) 500 MG tablet; Take 1 tablet (500 mg total) by mouth 2 (two) times daily with a meal.  Dispense: 60 tablet; Refill: 0 - Ambulatory referral to Physical Therapy  4. Chronic midline low back pain with sciatica, sciatica laterality unspecified - naproxen (NAPROSYN) 500 MG tablet; Take 1 tablet (500 mg total) by mouth 2 (two) times daily with a meal.  Dispense: 60 tablet; Refill: 0 - Ambulatory referral to Physical Therapy  5. Muscle weakness (generalized) - Ambulatory referral to Physical Therapy - Amb Ref to Medical Weight Management  6. Numbness and tingling of both feet - Ambulatory referral to Physical Therapy  7. Morbid obesity ([place]) - Ambulatory referral to Physical Therapy - Amb Ref to Medical Weight Management  8. Gait abnormality - Ambulatory referral to Physical Therapy  9. Spasm of muscle of lower back - naproxen (NAPROSYN) 500 MG tablet; Take 1 tablet (500 mg total) by mouth 2 (two) times daily with a meal.  Dispense: 60 tablet; Refill: 0 - Ambulatory referral to Physical Therapy  10. Abdominal spasms - Ambulatory referral to Physical Therapy  11. OSA (obstructive sleep apnea) - Amb Ref to Medical Weight Management  13. Dyspnea on exertion - Amb Ref to Medical Weight Management Multiple chronic issues affecting her overall health at this time. Her pain has become rather debilitating and she is not seeing much  relief with any of the measures that have been tried. Managing her symptoms is quite challenging due to the complexity of her chronic issues superimposed over her acute pain.  Muscle relaxants failed due to lethargy and fatigue, Steroid injections and oral prednisone not effective for pain, she reports increased sleepiness with gabapentin at 100mg  dose, and she is not interested in narcotic medication and this will likely not be effective for the type of pain she is experiencing and also cause fatigue.  Kidney function limits our options with many medications. She has had some luck with ibuprofen and tylenol combined. Will trial naproxen twice a day for short period to see if this is effective- will need to monitor kidney function and clotting times as she is on blood thinner.  I do feel that weight management will be of great benefit to her as well as physical therapy to help improve mobility and pain. Referrals placed for both today.  A referral to neurosurgery has been placed and she is awaiting an appointment from them. I am hopeful they will  be of some guidance for treatment options.  Plan discussed with patient with verbal and written instructions provided.  Will call and check on patient on Monday to ensure that her pain is improving with Naproxen and determine if changes need to be made.    14. Chest pain varying with breathing - naproxen (NAPROSYN) 500 MG tablet; Take 1 tablet (500 mg total) by mouth 2 (two) times daily with a meal.  Dispense: 60 tablet; Refill: 0 Pain sounds to be diaphragmatic in nature with a spasm component, however, it is possible that she is experiencing some pleural pain from possible increased fluid. Trial of naproxen to see if this is helpful for this pain and her additional pain she is experiencing.  If no improvement of pain by Monday, will plan for CXR for further evaluation.  No alarm symptoms present today concerning for exacerbation of CHF- symptoms consistent  with MSK pain overall.     I discussed the assessment and treatment plan with the patient. The patient was provided an opportunity to ask questions and all were answered. The patient agreed with the plan and demonstrated an understanding of the instructions.   The patient was advised to call back or seek an in-person evaluation if the symptoms worsen or if the condition fails to improve as anticipated.  I provided 31 minutes of non-face-to-face time during this TELEPHONE encounter.    Orma Render, NP

## 2020-12-22 NOTE — Patient Instructions (Signed)
I have prescribed Naproxen to see if this helps with some of the pain you are experiencing. Do not take ibuprofen with this medication, but you can take Tylenol if needed.   I would like you to take the Naproxen in the morning with food and again in the evening with dinner. I have sent this to the pharmacy for you.   I would like you to take the furosemide in the morning with food and again between lunch and 3pm.   You can take the gabapentin at bedtime to help with the pain while you sleep- hopefully this will prevent you waking up in so much pain.   I [facility] to [facility] and the other referral to [facility]. They should be contacting you within the next week.   If you are not seeing any improvement in the pain with deep breaths by Monday, we will go forward with the chest x-ray to make sure that there is nothing else going on.  I will call you on Monday and check in with you.

## 2020-12-25 ENCOUNTER — Telehealth (HOSPITAL_BASED_OUTPATIENT_CLINIC_OR_DEPARTMENT_OTHER): Payer: Self-pay | Admitting: Nurse Practitioner

## 2020-12-25 NOTE — Telephone Encounter (Signed)
Please call Natasha [name] this morning to see if she has had any relief by starting the naproxen for her pain. If she is still having the pain around her mid section I want her to come in for a chest xray so we can make sure that this is not something going on with her lungs by some chance. She doesn't have to come to see me, we can place the order for 2 view chest xray and she can come in at her convenience. Thank you.

## 2021-01-01 ENCOUNTER — Other Ambulatory Visit (HOSPITAL_BASED_OUTPATIENT_CLINIC_OR_DEPARTMENT_OTHER): Payer: Self-pay | Admitting: Nurse Practitioner

## 2021-01-01 ENCOUNTER — Telehealth (HOSPITAL_BASED_OUTPATIENT_CLINIC_OR_DEPARTMENT_OTHER): Payer: Self-pay

## 2021-01-01 DIAGNOSIS — R0600 Dyspnea, unspecified: Secondary | ICD-10-CM

## 2021-01-01 DIAGNOSIS — R0789 Other chest pain: Secondary | ICD-10-CM

## 2021-01-01 DIAGNOSIS — R0609 Other forms of dyspnea: Secondary | ICD-10-CM

## 2021-01-04 NOTE — Telephone Encounter (Signed)
Called patient to follow up to see if medication was giving any relief for pain.  She stated she has not been taking it for 48 hours and wanted to wait to see if this was going to make a difference.

## 2021-01-04 NOTE — Telephone Encounter (Signed)
Patient called because she is experiencing new pain right side of the stomach area and wants to have a chest x-ray.  Chest x-ray ordered and patient notified.

## 2021-01-08 ENCOUNTER — Other Ambulatory Visit (HOSPITAL_BASED_OUTPATIENT_CLINIC_OR_DEPARTMENT_OTHER): Payer: Self-pay

## 2021-01-08 ENCOUNTER — Other Ambulatory Visit (HOSPITAL_BASED_OUTPATIENT_CLINIC_OR_DEPARTMENT_OTHER)
Admission: RE | Admit: 2021-01-08 | Discharge: 2021-01-08 | Disposition: A | Discharge status: Home / Self Care | Payer: Medicare HMO | Source: Ambulatory Visit | Attending: Nurse Practitioner | Admitting: Nurse Practitioner

## 2021-01-08 ENCOUNTER — Ambulatory Visit (HOSPITAL_BASED_OUTPATIENT_CLINIC_OR_DEPARTMENT_OTHER)
Admission: RE | Admit: 2021-01-08 | Discharge: 2021-01-08 | Disposition: A | Discharge status: Home / Self Care | Payer: Medicare HMO | Source: Ambulatory Visit | Attending: Nurse Practitioner | Admitting: Nurse Practitioner

## 2021-01-08 ENCOUNTER — Other Ambulatory Visit: Payer: Self-pay

## 2021-01-08 DIAGNOSIS — I10 Essential (primary) hypertension: Secondary | ICD-10-CM

## 2021-01-08 DIAGNOSIS — R0609 Other forms of dyspnea: Secondary | ICD-10-CM

## 2021-01-08 DIAGNOSIS — M25551 Pain in right hip: Secondary | ICD-10-CM | POA: Diagnosis not present

## 2021-01-08 DIAGNOSIS — R0789 Other chest pain: Secondary | ICD-10-CM | POA: Insufficient documentation

## 2021-01-08 DIAGNOSIS — R0600 Dyspnea, unspecified: Secondary | ICD-10-CM | POA: Insufficient documentation

## 2021-01-08 DIAGNOSIS — R0602 Shortness of breath: Secondary | ICD-10-CM | POA: Diagnosis not present

## 2021-01-08 DIAGNOSIS — Z6841 Body Mass Index (BMI) 40.0 and over, adult: Secondary | ICD-10-CM | POA: Diagnosis not present

## 2021-01-08 DIAGNOSIS — M5441 Lumbago with sciatica, right side: Secondary | ICD-10-CM | POA: Insufficient documentation

## 2021-01-08 HISTORY — DX: Lumbago with sciatica, right side: M54.41

## 2021-01-08 NOTE — Progress Notes (Signed)
[facility]  

## 2021-01-08 NOTE — Telephone Encounter (Signed)
She is cleared for colonoscopy from a cardiac perspective. Best, Dr. Harrell Gave

## 2021-01-10 ENCOUNTER — Encounter (HOSPITAL_BASED_OUTPATIENT_CLINIC_OR_DEPARTMENT_OTHER): Payer: Self-pay | Admitting: Nurse Practitioner

## 2021-01-10 ENCOUNTER — Other Ambulatory Visit (HOSPITAL_BASED_OUTPATIENT_CLINIC_OR_DEPARTMENT_OTHER): Payer: Self-pay | Admitting: Nurse Practitioner

## 2021-01-10 DIAGNOSIS — N3281 Overactive bladder: Secondary | ICD-10-CM

## 2021-01-10 DIAGNOSIS — R0600 Dyspnea, unspecified: Secondary | ICD-10-CM

## 2021-01-10 DIAGNOSIS — R0609 Other forms of dyspnea: Secondary | ICD-10-CM

## 2021-01-10 MED ORDER — GEMTESA 75 MG PO TABS: 75.0000 mg | ORAL_TABLET | Freq: Every day | ORAL | 6 refills | Status: DC

## 2021-01-10 NOTE — Progress Notes (Signed)
[facility] message sent- please call patient if not viewed by 01/11/2021 Chest x-ray results have returned and show no signs of enlarged heart or changes in the lungs to indicate pneumonia, fluid, collapsed lung, or air trapping. Soft tissues are normal. No acute changes to the bone structure to limit airflow.   Shortness of breath most likely resultant of continued symptoms post COVID-19. She may benefit from pulmonary evaluation for spirometry to determine if a component of asthma or COPD is present. It would be reasonable to consider trial of albuterol to see if this helps with her symptoms.

## 2021-01-11 ENCOUNTER — Other Ambulatory Visit: Payer: Self-pay | Admitting: Neurosurgery

## 2021-01-11 DIAGNOSIS — M25551 Pain in right hip: Secondary | ICD-10-CM

## 2021-01-15 ENCOUNTER — Telehealth (HOSPITAL_BASED_OUTPATIENT_CLINIC_OR_DEPARTMENT_OTHER): Payer: Self-pay

## 2021-01-15 NOTE — Telephone Encounter (Signed)
-----   Message from Orma Render, NP sent at 01/10/2021 10:46 AM EDT ----- [facility] message sent- please call patient if not viewed by 01/11/2021 Chest x-ray results have returned and show no signs of enlarged heart or changes in the lungs to indicate pneumonia, fluid, collapsed lung, or air trapping. Soft tissues are normal. No acute changes to the bone structure to limit airflow.   Shortness of breath most likely resultant of continued symptoms post COVID-19. She may benefit from pulmonary evaluation for spirometry to determine if a component of asthma or COPD is present. It would be reasonable to consider trial of albuterol to see if this helps with her symptoms.

## 2021-01-15 NOTE — Telephone Encounter (Signed)
Results released by Sarabeth Early, AGNP and reviewed by patient via MyChart. Instructed patient to contact the office with any questions or concerns.  

## 2021-01-18 ENCOUNTER — Other Ambulatory Visit: Payer: Self-pay

## 2021-01-18 ENCOUNTER — Encounter: Payer: Self-pay | Admitting: Cardiology

## 2021-01-18 ENCOUNTER — Ambulatory Visit: Payer: BC Managed Care – PPO | Admitting: Cardiology

## 2021-01-18 VITALS — BP 118/84 | HR 66 | Ht 64.5 in | Wt 319.0 lb

## 2021-01-18 DIAGNOSIS — Z7189 Other specified counseling: Secondary | ICD-10-CM | POA: Diagnosis not present

## 2021-01-18 DIAGNOSIS — Z86711 Personal history of pulmonary embolism: Secondary | ICD-10-CM

## 2021-01-18 DIAGNOSIS — Z8616 Personal history of COVID-19: Secondary | ICD-10-CM

## 2021-01-18 DIAGNOSIS — R600 Localized edema: Secondary | ICD-10-CM

## 2021-01-18 DIAGNOSIS — I10 Essential (primary) hypertension: Secondary | ICD-10-CM

## 2021-01-18 NOTE — Progress Notes (Signed)
Cardiology Office Note:    Date:  01/18/2021   ID:  Natasha [name], DOB June 18, 1951, MRN [id number]  PCP:  Orma Render, NP  Cardiologist:  Buford Dresser, MD  Referring MD: Orma Render, NP   CC: follow up  History of Present Illness:    Natasha [name] is a 70 y.o. female with a hx of Covid-19 infection in 08/2019, pulmonary embolism, hypertension, hyperthyroidism who is seen for follow up. I initially saw her 11/17/19 as a new consult at the request of Natasha Lei, MD for the evaluation and management of post-Covid/post-PE management and bradycardia.  History: She required hospitalization for Covid pneumonia from 08/21/19-09/06/19. She received steroids and remdesivir. During her admission, she was diagnosed with submassive PE and started on rivaroxaban. She was recommended for cardiology post discharge follow up for bradycardia and monitoring of RV function.  She was seen by Dr. Radford Pax during her admission, note dated 08/23/19. Noted to have sinus bradycardia in the 40s and 50s.   Today: For the past few weeks she has developed pain on her right side as well as lower back pain. She also has a sensation of heaviness in her chest and abdomen, which she attributes to fluid retention. She also reports a correlating squeezing pain around her abdomen. Today she has some LE edema.  She knows she is not exercising as much as she can due to a lack of motivation. She used to exercise in a pool prior to Covid, and has plans to do so again. Typically she eats 1 meal at noon, and fruit and a protein shake for dinner. She is possibly looking to work with a nutritionist.  She denies any shortness of breath, palpitations, or exertional symptoms. No headaches, lightheadedness, or syncope to report. Also has no lower extremity edema, orthopnea or PND.   Past Medical History:  Diagnosis Date  . Acute pulmonary embolism ([place]) 08/21/2019  . Bradycardia   . Breast cancer screening by mammogram  11/24/2020  . 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 Colon cancer screening 11/24/2020  . Colon polyps   . Complication of anesthesia    trouble with airway  . Cough 12/25/2007   Qualifier: Diagnosis of  By: Doy Mince LPN, Megan    . COVID-19 08/19/2019  . Diverticulosis   . DVT (deep venous thrombosis) ([place])   . Edema 12/22/2008  . Encounter to establish care 11/24/2020  . History of 2019 novel coronavirus disease (COVID-19) 11/09/2019  . HYPERTENSION 12/25/2007  . HYPOTHYROIDISM, POSTSURGICAL 05/04/2008  . LEG PAIN, LEFT 12/09/2008  . Muscle weakness   . Numbness and tingling   . OSA (obstructive sleep apnea) 06/15/2013   CPAP  . Peripheral edema 12/22/2008   Qualifier: Diagnosis of  By: Johnsie Cancel, MD, Rona Ravens   . Pneumonia due to COVID-19 virus   . Pulmonary embolism ([place])   . Right lower quadrant pain 11/24/2020    Past Surgical History:  Procedure Laterality Date  . ABDOMINAL HYSTERECTOMY    . ANKLE SURGERY Right   . CERVICAL FUSION     x 2  . COLONOSCOPY WITH PROPOFOL N/A 08/08/2015   Procedure: COLONOSCOPY WITH PROPOFOL;  Surgeon: Juanita Craver, MD;  Location: [facility];  Service: Endoscopy;  Laterality: N/A;  . KNEE SURGERY Left   . TOTAL THYROIDECTOMY  Current Medications: Current Outpatient Medications on File Prior to Visit  Medication Sig  . amLODipine (NORVASC) 5 MG tablet Take 1 tablet (5 mg total) by mouth at bedtime.  . furosemide (LASIX) 20 MG tablet Take 1 tablet (20 mg total) by mouth 2 (two) times daily.  . Multiple Vitamin (MULTIVITAMIN WITH MINERALS) TABS tablet Take 1 tablet by mouth daily.  . naproxen (NAPROSYN) 500 MG tablet Take 1 tablet (500 mg total) by mouth 2 (two) times daily with a meal.  . pantoprazole (PROTONIX) 40 MG tablet Take 1 tablet (40 mg total) by mouth daily. Take one  tablet 30-60 minutes before breakfast.  . SYNTHROID 112 MCG tablet Take 1 tablet (112 mcg total) by mouth every morning.  . valsartan-hydrochlorothiazide (DIOVAN-HCT) 160-12.5 MG tablet Take 1 tablet by mouth daily.  . Vibegron (GEMTESA) 75 MG TABS Take 75 mg by mouth daily.  . rivaroxaban (XARELTO) 20 MG TABS tablet Take 1 tablet (20 mg total) by mouth daily with supper.   No current facility-administered medications on file prior to visit.     Allergies:   Codeine and Eggs or egg-derived products   Social History   Tobacco Use  . Smoking status: Never Smoker  . Smokeless tobacco: Never Used  Vaping Use  . Vaping Use: Never used  Substance Use Topics  . Alcohol use: Yes    Comment: 1 every 1-2 months  . Drug use: No    Family History: family history includes Heart disease in her mother; Kidney disease in her mother; Other in her father.  ROS:   Please see the history of present illness.   (+) Pain, right side (+) Pain, lower back (+) "Squeezing" abdominal pain (+) Fluid retention/"Heaviness" in chest and abdomen (+) LE Edema Additional pertinent ROS otherwise unremarkable.  EKGs/Labs/Other Studies Reviewed:    The following studies were reviewed today:  Echo 12/22/19 1. Left ventricular ejection fraction, by estimation, is 60 to 65%. The  left ventricle has normal function. The left ventricle has no regional  wall motion abnormalities. Left ventricular diastolic parameters are  indeterminate.  2. Right ventricular systolic function is normal. The right ventricular  size is not well visualized. Tricuspid regurgitation signal is inadequate  for assessing PA pressure.  3. The mitral valve is grossly normal. Trivial mitral valve  regurgitation. No evidence of mitral stenosis.  4. The aortic valve was not well visualized. Aortic valve regurgitation  is not visualized. No aortic stenosis is present.  5. The inferior vena cava is normal in size with greater than 50%   respiratory variability, suggesting right atrial pressure of 3 mmHg.   Comparison(s): No significant change from prior study.   Monitor 12/31/19 ~7 days of data recorded on Zio monitor. Patient had a min HR of 44 bpm, max HR of 174 bpm, and avg HR of 67 bpm. Predominant underlying rhythm was Sinus Rhythm. No VT, atrial fibrillation, high degree block, or pauses noted. Isolated atrial and ventricular ectopy was rare (<1%). There were 5 triggered events. These were sinus rhythm with/without PVC. There were 10 SVT events, the fastest interval lasting 7 beats with a max rate of 174 bpm, the longest 21 beats/9.3 secs with an avg rate of 131 bpm.   Echo 08/22/19 1. Left ventricular ejection fraction, by visual estimation, is 65 to  70%. The left ventricle has hyperdynamic function. There is no left  ventricular hypertrophy.  2. The left ventricle has no regional wall motion abnormalities.  3. Global right  ventricle has low normal systolic function.The right  ventricular size is mildly enlarged. No increase in right ventricular wall  thickness.  4. Presence of pericardial fat pad.  5. The mitral valve is grossly normal. Trivial mitral valve  regurgitation.  6. The tricuspid valve is grossly normal. Tricuspid valve regurgitation  is not demonstrated.  7. The tricuspid valve was normal in structure. Tricuspid valve  regurgitation is not demonstrated.  8. The aortic valve is grossly normal. Aortic valve regurgitation is not  visualized. No evidence of aortic valve sclerosis or stenosis.  9. The inferior vena cava is dilated in size with <50% respiratory  variability, suggesting right atrial pressure of 15 mmHg.  10. Technically challenging study. Normal LV function. RV appears to be  mildly enlarged, with borderline normal function. Unable to visualize PA  well.  11. The interatrial septum was not assessed.   EKG:  EKG is personally reviewed.   01/18/2021: NSR at 66 bpm, PRWP 11/17/19:  NSR  Recent Labs: 11/10/2020: ALT 10; BUN 16; Creatinine, Ser 1.19; Hemoglobin 12.5; Platelets 268; Potassium 4.4; Sodium 140  Recent Lipid Panel    Component Value Date/Time   CHOL 174 09/04/2019 1230   TRIG 89 09/04/2019 1230   HDL 76 09/04/2019 1230   CHOLHDL 2.3 09/04/2019 1230   VLDL 18 09/04/2019 1230   LDLCALC 80 09/04/2019 1230    Physical Exam:    VS:  BP 118/84 (BP Location: Left Arm, Patient Position: Sitting, Cuff Size: Large)   Pulse 66   Ht 5' 4.5" (1.638 m)   Wt (!) 319 lb (144.7 kg)   BMI 53.91 kg/m     Wt Readings from Last 3 Encounters:  01/18/21 (!) 319 lb (144.7 kg)  12/12/20 (!) 320 lb (145.2 kg)  11/24/20 291 lb 0.1 oz (132 kg)    GEN: Well nourished, well developed in no acute distress HEENT: Normal, moist mucous membranes NECK: No JVD CARDIAC: regular rhythm, normal S1 and S2, no rubs or gallops. No murmur. VASCULAR: Radial and DP pulses 2+ bilaterally. No carotid bruits RESPIRATORY:  Clear to auscultation without rales, wheezing or rhonchi  ABDOMEN: Soft, non-tender, non-distended MUSCULOSKELETAL:  Ambulates independently SKIN: Warm and dry, bilateral mild nonpitting edema NEUROLOGIC:  Alert and oriented x 3. No focal neuro deficits noted. PSYCHIATRIC:  Normal affect   ASSESSMENT:    1. Bilateral leg edema   2. Essential hypertension   3. History of 2019 novel coronavirus disease (COVID-19)   4. History of pulmonary embolism   5. Counseling on health promotion and disease prevention   6. Cardiac risk counseling   7. Morbid obesity ([place])    PLAN:    History of Covid pneumonia and pulmonary embolism, with evidence of right sided heart failure at diagnosis:  -still short of breath, but feels like gradually improving. Getting spirometer. Limited ambulatory ability due to multiple issues -echo 12/2019, right atrial pressure normal, TR inadequate for RVSP, RV normal -counseled on need to continue using CPAP for OSA -counseled on daily weights,  salt avoidance -counseled on compression stockings and leg elevation. With normal right atrial pressure, suspect component of chronic venous insufficiency -continue furosemide 20 mg BID PRN for worsening swelling -remains on rivaroxaban, likely lifelong  Hypertension: systolic at goal today -continued amlodipine. Do not suspect that 5 mg dose is contributing significantly to LE edema -continue valsartan-HCTZ daily  Morbid obesity: she is frustrated, working on diet and limited on exercise. We discussed GLP1RA for weight loss today. She is  interested if she can afford it. Will reach out to our PharmD clinic.  Cardiac risk counseling and prevention recommendations: -recommend heart healthy/Mediterranean diet, with whole grains, fruits, vegetable, fish, lean meats, nuts, and olive oil. Limit salt. -recommend moderate walking, 3-5 times/week for 30-50 minutes each session. Aim for at least 150 minutes.week. Goal should be pace of 3 miles/hours, or walking 1.5 miles in 30 minutes. This is a long term goal. -recommend avoidance of tobacco products. Avoid excess alcohol. -ASCVD risk score: she denies diabetes (A1c 5.8), so this is an overestimate The 10-year ASCVD risk score Mikey Bussing DC Jr., et al., 2013) is: 21%   Values used to calculate the score:     Age: 32 years     Sex: Female     Is Non-Hispanic African American: Yes     Diabetic: Yes     Tobacco smoker: No     Systolic Blood Pressure: 123456 mmHg     Is BP treated: Yes     HDL Cholesterol: 76 mg/dL     Total Cholesterol: 174 mg/dL    Plan for follow up: 3 months or sooner as needed  Total time of encounter: 54 minutes total time of encounter, including 45 minutes spent in face-to-face patient care. This time includes coordination of care and counseling regarding symptoms, prior workup, recommendations for management. Remainder of non-face-to-face time involved reviewing chart documents/testing relevant to the patient encounter and  documentation in the medical record.  Buford Dresser, MD, PhD [place]  [facility]   Medication Adjustments/Labs and Tests Ordered: Current medicines are reviewed at length with the patient today.  Concerns regarding medicines are outlined above.  Orders Placed This Encounter  Procedures  . EKG 12-Lead   No orders of the defined types were placed in this encounter.   Patient Instructions  Medication Instructions:  Your Physician recommend you continue on your current medication as directed.    *If you need a refill on your cardiac medications before your next appointment, please call your pharmacy*   Lab Work: None   Testing/Procedures: None   Follow-Up: At [facility], you and your health needs are our priority.  As part of our continuing mission to provide you with exceptional heart care, we have created designated Provider Care Teams.  These Care Teams include your primary Cardiologist (physician) and Advanced Practice Providers (APPs -  Physician Assistants and Nurse Practitioners) who all work together to provide you with the care you need, when you need it.  We recommend signing up for the patient portal called "MyChart".  Sign up information is provided on this After Visit Summary.  MyChart is used to connect with patients for Virtual Visits (Telemedicine).  Patients are able to view lab/test results, encounter notes, upcoming appointments, etc.  Non-urgent messages can be sent to your provider as well.   To learn more about what you can do with MyChart, go to [email or web address].    Your next appointment:   3 month(s) @ [address] [place], [place] [address]   The format for your next appointment:   In Person  Provider:   Buford Dresser, MD        [facility],acting as a scribe for Buford Dresser, MD.,have documented all relevant documentation on the behalf of Buford Dresser, MD,as directed by   Buford Dresser, MD while in the presence of Buford Dresser, MD.  I, Buford Dresser, MD, have reviewed all documentation for this visit. The documentation on 01/18/21 for the  exam, diagnosis, procedures, and orders are all accurate and complete.  Signed, Buford Dresser, MD PhD 01/18/2021  [place]

## 2021-01-18 NOTE — Patient Instructions (Signed)
Medication Instructions:  Your Physician recommend you continue on your current medication as directed.    *If you need a refill on your cardiac medications before your next appointment, please call your pharmacy*   Lab Work: None   Testing/Procedures: None   Follow-Up: At [facility], you and your health needs are our priority.  As part of our continuing mission to provide you with exceptional heart care, we have created designated Provider Care Teams.  These Care Teams include your primary Cardiologist (physician) and Advanced Practice Providers (APPs -  Physician Assistants and Nurse Practitioners) who all work together to provide you with the care you need, when you need it.  We recommend signing up for the patient portal called "MyChart".  Sign up information is provided on this After Visit Summary.  MyChart is used to connect with patients for Virtual Visits (Telemedicine).  Patients are able to view lab/test results, encounter notes, upcoming appointments, etc.  Non-urgent messages can be sent to your provider as well.   To learn more about what you can do with MyChart, go to [email or web address].    Your next appointment:   3 month(s) @ [address] [place], [place] [address]   The format for your next appointment:   In Person  Provider:   Bridgette Christopher, MD   

## 2021-01-22 ENCOUNTER — Other Ambulatory Visit: Payer: Self-pay

## 2021-01-22 ENCOUNTER — Ambulatory Visit
Admission: RE | Admit: 2021-01-22 | Discharge: 2021-01-22 | Disposition: A | Discharge status: Home / Self Care | Payer: Medicare HMO | Source: Ambulatory Visit | Attending: Neurosurgery | Admitting: Neurosurgery

## 2021-01-22 DIAGNOSIS — M25551 Pain in right hip: Secondary | ICD-10-CM

## 2021-01-23 ENCOUNTER — Telehealth: Payer: Self-pay | Admitting: Pharmacist

## 2021-01-23 NOTE — Telephone Encounter (Signed)
Received staff message from Dr. Harrell Gave patient interested in [facility]  Called patient.  No answer, left message on machine

## 2021-01-28 ENCOUNTER — Other Ambulatory Visit: Payer: Medicare HMO

## 2021-01-31 NOTE — Telephone Encounter (Signed)
Called pt again. Discussed that Ozempic is on her formulary but Mancel Parsons is not covered since she has [employer]. It sounds as though she is already in the donut hole as her Xarelto copay is > $100/month, so her Ozempic copay would be similar. Upon further investigation, pt has a history of papillary thyroid cancer. GLP1-RA are contraindicated in patients with a history of medullary thyroid cancer or multiple endocrine neoplasia syndrome type 2. She does not have the specific type of thyroid cancer contraindicated for Ozempic use, however she does not wish to start therapy due to this which is understandable. Will forward to Dr Harrell Gave as an Juluis Rainier.

## 2021-02-03 ENCOUNTER — Other Ambulatory Visit: Payer: Medicare HMO

## 2021-02-05 ENCOUNTER — Other Ambulatory Visit: Payer: Self-pay

## 2021-02-05 ENCOUNTER — Ambulatory Visit
Admission: RE | Admit: 2021-02-05 | Discharge: 2021-02-05 | Disposition: A | Discharge status: Home / Self Care | Payer: Medicare HMO | Source: Ambulatory Visit | Attending: Neurosurgery | Admitting: Neurosurgery

## 2021-02-05 DIAGNOSIS — M160 Bilateral primary osteoarthritis of hip: Secondary | ICD-10-CM | POA: Diagnosis not present

## 2021-02-05 DIAGNOSIS — M24151 Other articular cartilage disorders, right hip: Secondary | ICD-10-CM | POA: Diagnosis not present

## 2021-02-05 DIAGNOSIS — M5136 Other intervertebral disc degeneration, lumbar region: Secondary | ICD-10-CM | POA: Diagnosis not present

## 2021-02-05 DIAGNOSIS — Z85850 Personal history of malignant neoplasm of thyroid: Secondary | ICD-10-CM | POA: Diagnosis not present

## 2021-02-05 MED ORDER — GADOPENTETATE DIMEGLUMINE 469.01 MG/ML IV SOLN
20.0000 mL | Freq: Once | INTRAVENOUS | Status: AC | PRN
  Administered 2021-02-05: 20 mL via INTRAVENOUS

## 2021-02-06 ENCOUNTER — Ambulatory Visit: Payer: Medicare HMO | Admitting: Gastroenterology

## 2021-02-20 DIAGNOSIS — I10 Essential (primary) hypertension: Secondary | ICD-10-CM | POA: Diagnosis not present

## 2021-02-20 DIAGNOSIS — M25551 Pain in right hip: Secondary | ICD-10-CM | POA: Diagnosis not present

## 2021-02-20 DIAGNOSIS — Z6841 Body Mass Index (BMI) 40.0 and over, adult: Secondary | ICD-10-CM | POA: Diagnosis not present

## 2021-02-20 DIAGNOSIS — M25552 Pain in left hip: Secondary | ICD-10-CM | POA: Diagnosis not present

## 2021-03-01 ENCOUNTER — Telehealth: Payer: Self-pay | Admitting: *Deleted

## 2021-03-01 ENCOUNTER — Encounter: Payer: Self-pay | Admitting: Neurology

## 2021-03-01 ENCOUNTER — Institutional Professional Consult (permissible substitution): Payer: Medicare HMO | Admitting: Neurology

## 2021-03-01 NOTE — Telephone Encounter (Signed)
No showed consult appointment.

## 2021-03-12 ENCOUNTER — Telehealth (HOSPITAL_BASED_OUTPATIENT_CLINIC_OR_DEPARTMENT_OTHER): Payer: Self-pay | Admitting: Nurse Practitioner

## 2021-03-12 ENCOUNTER — Other Ambulatory Visit: Payer: Self-pay

## 2021-03-12 ENCOUNTER — Ambulatory Visit (INDEPENDENT_AMBULATORY_CARE_PROVIDER_SITE_OTHER): Payer: Medicare HMO | Admitting: Nurse Practitioner

## 2021-03-12 ENCOUNTER — Ambulatory Visit (HOSPITAL_BASED_OUTPATIENT_CLINIC_OR_DEPARTMENT_OTHER): Payer: Medicare HMO | Admitting: Nurse Practitioner

## 2021-03-12 ENCOUNTER — Encounter (HOSPITAL_BASED_OUTPATIENT_CLINIC_OR_DEPARTMENT_OTHER): Payer: Self-pay

## 2021-03-12 DIAGNOSIS — R309 Painful micturition, unspecified: Secondary | ICD-10-CM

## 2021-03-12 LAB — POCT URINALYSIS DIPSTICK
Bilirubin, UA: NEGATIVE
Glucose, UA: NEGATIVE
Ketones, UA: NEGATIVE
Leukocytes, UA: NEGATIVE
Nitrite, UA: NEGATIVE
Spec Grav, UA: 1.025 (ref 1.010–1.025)
Urobilinogen, UA: 0.2 E.U./dL
pH, UA: 7 (ref 5.0–8.0)

## 2021-03-12 NOTE — Telephone Encounter (Signed)
Patient missed appointment window today. Given symptoms of urinary infection- patient changed to nurse visit for UA and Culture. Will send treatment as determined based on results.

## 2021-03-12 NOTE — Progress Notes (Signed)
Patient presents for visit today with pain with urination.  No fever, chills.  Alert and oriented.    UA and culture per verbal order.  Provider will be notified of results for treatment.  Medical screening and questions were performed by medical assistant.  I was immediately available for consultation and collaboration. All decision making made by myself. I agree with the assessment by CMA. Plan to start ciprofloxacin for treatment of UTI. Medication sent to pharmacy.    Orma Render, DNP, AGNP-c

## 2021-03-12 NOTE — Progress Notes (Deleted)
Acute Office Visit  Subjective:    Patient ID: Natasha [name], female    DOB: [birth year], 70 y.o.   MRN: [id number]  No chief complaint on file.   HPI Patient is in today for ***  Past Medical History:  Diagnosis Date   Acute pulmonary embolism ([place]) 08/21/2019   Bradycardia    Breast cancer screening by mammogram 11/24/2020   CARCINOMA, THYROID GLAND, PAPILLARY 05/04/2008   Stage 2, 8/09: thyroidectomy for 2.7cm papillary adenocarcinoma (t2 n0 mo) 9/09: I-131 rx, 108 [facility] 05/10: tg is neg (ab neg) , total body scan is neg   Colon cancer screening 11/24/2020   Colon polyps    Complication of anesthesia    trouble with airway   Cough 12/25/2007   Qualifier: Diagnosis of  By: Doy Mince LPN, Megan     [id number] 08/19/2019   Diverticulosis    DVT (deep venous thrombosis) ([place])    Edema 12/22/2008   Encounter to establish care 11/24/2020   History of 2019 novel coronavirus disease (COVID-19) 11/09/2019   HYPERTENSION 12/25/2007   HYPOTHYROIDISM, POSTSURGICAL 05/04/2008   LEG PAIN, LEFT 12/09/2008   Muscle weakness    Numbness and tingling    OSA (obstructive sleep apnea) 06/15/2013   CPAP   Peripheral edema 12/22/2008   Qualifier: Diagnosis of  By: Johnsie Cancel, MD, Rona Ravens    Pneumonia due to COVID-19 virus    Pulmonary embolism ([place])    Right lower quadrant pain 11/24/2020    Past Surgical History:  Procedure Laterality Date   ABDOMINAL HYSTERECTOMY     ANKLE SURGERY Right    CERVICAL FUSION     x 2   COLONOSCOPY WITH PROPOFOL N/A 08/08/2015   Procedure: COLONOSCOPY WITH PROPOFOL;  Surgeon: Juanita Craver, MD;  Location: [facility];  Service: Endoscopy;  Laterality: N/A;   KNEE SURGERY Left    TOTAL THYROIDECTOMY      Family History  Problem Relation Age of Onset   Heart disease Mother    Kidney disease Mother    Other Father        unsure of medical history    Social History   Socioeconomic History   Marital status: Widowed    Spouse name: Not on file   Number of  children: 3   Years of education: college   Highest education level: Master's degree (e.g., MA, MS, MEng, MEd, MSW, MBA)  Occupational History   Occupation: Teacher - Sport and exercise psychologist  Tobacco Use   Smoking status: Never   Smokeless tobacco: Never  Vaping Use   Vaping Use: Never used  Substance and Sexual Activity   Alcohol use: Yes    Comment: 1 every 1-2 months   Drug use: No   Sexual activity: Not Currently  Other Topics Concern   Not on file  Social History Narrative   Lives alone.   Right-handed.   No daily caffeine use.   Social Determinants of Health   Financial Resource Strain: Not on file  Food Insecurity: Not on file  Transportation Needs: Not on file  Physical Activity: Not on file  Stress: Not on file  Social Connections: Not on file  Intimate Partner Violence: Not on file    Outpatient Medications Prior to Visit  Medication Sig Dispense Refill   amLODipine (NORVASC) 5 MG tablet Take 1 tablet (5 mg total) by mouth at bedtime. 90 tablet 1   furosemide (LASIX) 20 MG tablet Take 1 tablet (20 mg total) by mouth 2 (two)  times daily. 60 tablet 3   Multiple Vitamin (MULTIVITAMIN WITH MINERALS) TABS tablet Take 1 tablet by mouth daily.     naproxen (NAPROSYN) 500 MG tablet Take 1 tablet (500 mg total) by mouth 2 (two) times daily with a meal. 60 tablet 0   pantoprazole (PROTONIX) 40 MG tablet Take 1 tablet (40 mg total) by mouth daily. Take one tablet 30-60 minutes before breakfast. 30 tablet 3   rivaroxaban (XARELTO) 20 MG TABS tablet Take 1 tablet (20 mg total) by mouth daily with supper. 30 tablet 0   SYNTHROID 112 MCG tablet Take 1 tablet (112 mcg total) by mouth every morning. 30 tablet 3   valsartan-hydrochlorothiazide (DIOVAN-HCT) 160-12.5 MG tablet Take 1 tablet by mouth daily. 90 tablet 1   Vibegron (GEMTESA) 75 MG TABS Take 75 mg by mouth daily. 30 tablet 6   No facility-administered medications prior to visit.    Allergies  Allergen Reactions   Codeine  Shortness Of Breath, Palpitations and Other (See Comments)   Eggs Or Egg-Derived Products Nausea And Vomiting    PROJECTILE VOMITING    Review of Systems     Objective:    Physical Exam  There were no vitals taken for this visit. Wt Readings from Last 3 Encounters:  01/18/21 (!) 319 lb (144.7 kg)  12/12/20 (!) 320 lb (145.2 kg)  11/24/20 291 lb 0.1 oz (132 kg)    Health Maintenance Due  Topic Date Due   Hepatitis C Screening  Never done   TETANUS/TDAP  Never done   Zoster Vaccines- Shingrix (1 of 2) Never done   DEXA SCAN  Never done   PNA vac Low Risk Adult (1 of 2 - PCV13) Never done   COVID-19 Vaccine (4 - Booster for Pfizer series) 10/18/2020    There are no preventive care reminders to display for this patient.   Lab Results  Component Value Date   TSH 0.288 (L) 09/02/2019   Lab Results  Component Value Date   WBC 7.9 11/10/2020   HGB 12.5 11/10/2020   HCT 38.6 11/10/2020   MCV 90.8 11/10/2020   PLT 268 11/10/2020   Lab Results  Component Value Date   NA 140 11/10/2020   K 4.4 11/10/2020   CO2 24 11/10/2020   GLUCOSE 96 11/10/2020   BUN 16 11/10/2020   CREATININE 1.19 (H) 11/10/2020   BILITOT 0.5 11/10/2020   ALKPHOS 74 11/10/2020   AST 17 11/10/2020   ALT 10 11/10/2020   PROT 7.3 11/10/2020   ALBUMIN 3.9 11/10/2020   CALCIUM 9.0 11/10/2020   ANIONGAP 9 11/10/2020   Lab Results  Component Value Date   CHOL 174 09/04/2019   Lab Results  Component Value Date   HDL 76 09/04/2019   Lab Results  Component Value Date   LDLCALC 80 09/04/2019   Lab Results  Component Value Date   TRIG 89 09/04/2019   Lab Results  Component Value Date   CHOLHDL 2.3 09/04/2019   No results found for: HGBA1C     Assessment & Plan:   Problem List Items Addressed This Visit   None    No orders of the defined types were placed in this encounter.    Orma Render, NP

## 2021-03-13 LAB — URINE CULTURE

## 2021-03-14 ENCOUNTER — Telehealth (HOSPITAL_BASED_OUTPATIENT_CLINIC_OR_DEPARTMENT_OTHER): Payer: Self-pay

## 2021-03-14 NOTE — Progress Notes (Signed)
Please call pt: Culture negative- no UTI present. She can stop the antibiotic. I recommend that she contact urology for evaluation of her symptoms since they are bothersome. There may be interventions that they can recommend.

## 2021-03-14 NOTE — Telephone Encounter (Signed)
-----   Message from Orma Render, NP sent at 03/14/2021  8:35 AM EDT ----- Please call pt: Culture negative- no UTI present. She can stop the antibiotic. I recommend that she contact urology for evaluation of her symptoms since they are bothersome. There may be interventions that they can recommend.

## 2021-03-14 NOTE — Telephone Encounter (Signed)
Called patient to discuss urine culture, but her voicemail if full and could not leave a message.

## 2021-03-15 ENCOUNTER — Telehealth (HOSPITAL_BASED_OUTPATIENT_CLINIC_OR_DEPARTMENT_OTHER): Payer: Self-pay

## 2021-03-15 NOTE — Telephone Encounter (Signed)
-----   Message from Orma Render, NP sent at 03/14/2021  8:35 AM EDT ----- Please call pt: Culture negative- no UTI present. She can stop the antibiotic. I recommend that she contact urology for evaluation of her symptoms since they are bothersome. There may be interventions that they can recommend.

## 2021-03-15 NOTE — Telephone Encounter (Signed)
Called patient to inform her the urine culture is negative and for her to stop taking medication prescribed for a urinary infection.  She is aware and understands.  Instructed her to contact the office with questions or concerns.

## 2021-03-16 ENCOUNTER — Ambulatory Visit (INDEPENDENT_AMBULATORY_CARE_PROVIDER_SITE_OTHER): Payer: Medicare HMO | Admitting: Nurse Practitioner

## 2021-03-16 ENCOUNTER — Other Ambulatory Visit: Payer: Self-pay

## 2021-03-16 ENCOUNTER — Encounter (HOSPITAL_BASED_OUTPATIENT_CLINIC_OR_DEPARTMENT_OTHER): Payer: Self-pay | Admitting: Nurse Practitioner

## 2021-03-16 VITALS — BP 150/85 | HR 98 | Ht 65.0 in | Wt 319.6 lb

## 2021-03-16 DIAGNOSIS — B3731 Acute candidiasis of vulva and vagina: Secondary | ICD-10-CM

## 2021-03-16 DIAGNOSIS — E559 Vitamin D deficiency, unspecified: Secondary | ICD-10-CM | POA: Diagnosis not present

## 2021-03-16 DIAGNOSIS — E890 Postprocedural hypothyroidism: Secondary | ICD-10-CM

## 2021-03-16 DIAGNOSIS — R600 Localized edema: Secondary | ICD-10-CM | POA: Diagnosis not present

## 2021-03-16 DIAGNOSIS — K219 Gastro-esophageal reflux disease without esophagitis: Secondary | ICD-10-CM | POA: Diagnosis not present

## 2021-03-16 DIAGNOSIS — B373 Candidiasis of vulva and vagina: Secondary | ICD-10-CM | POA: Diagnosis not present

## 2021-03-16 DIAGNOSIS — I10 Essential (primary) hypertension: Secondary | ICD-10-CM

## 2021-03-16 HISTORY — DX: Candidiasis of vulva and vagina: B37.3

## 2021-03-16 HISTORY — DX: Acute candidiasis of vulva and vagina: B37.31

## 2021-03-16 MED ORDER — PANTOPRAZOLE SODIUM 40 MG PO TBEC: 40.0000 mg | DELAYED_RELEASE_TABLET | Freq: Every day | ORAL | 6 refills | Status: DC

## 2021-03-16 MED ORDER — FLUCONAZOLE 150 MG PO TABS: ORAL_TABLET | ORAL | 1 refills | Status: DC

## 2021-03-16 MED ORDER — FUROSEMIDE 20 MG PO TABS: 20.0000 mg | ORAL_TABLET | Freq: Two times a day (BID) | ORAL | 6 refills | Status: DC

## 2021-03-16 MED ORDER — VALSARTAN-HYDROCHLOROTHIAZIDE 160-12.5 MG PO TABS
1.0000 | ORAL_TABLET | Freq: Every day | ORAL | 6 refills | Status: DC
Start: 2021-03-16 — End: 2021-04-30

## 2021-03-16 MED ORDER — AMLODIPINE BESYLATE 5 MG PO TABS: 5.0000 mg | ORAL_TABLET | Freq: Every day | ORAL | 6 refills | Status: DC

## 2021-03-16 MED ORDER — SYNTHROID 112 MCG PO TABS: 112.0000 ug | ORAL_TABLET | Freq: Every morning | ORAL | 6 refills | Status: DC

## 2021-03-16 NOTE — Progress Notes (Signed)
Established Patient Office Visit  Subjective:  Patient ID: Natasha [name], female    DOB: [birth year]  Age: 70 y.o. MRN: [id number]  CC:  Chief Complaint  Patient presents with   Abdominal Pain    Patient states she has been having hot flashes, has a smelly discharge and wheezing and hoarseness.    HPI Natasha [name] presents for concerns with hot flashes, vaginal discharge, abdominal pain, wheezing and hoarseness, and opinion on hip replacement.   Reports that she has not had hot flashes since she was in her 32's but these have been present lately. She is currently in a new relationship and is unsure if these are related to her emotional changes or not. She is experiencing some hoarseness in her voice that she feels may be related to her thyroid- she has a hx of thyroid cancer with removal and this is how it presented in the past.   She reports vaginal discharge and itching that have been present for several days. She reports that she has noticed an odor present. She relates this to yeast. She would like to have treatment for yeast today, if possible. No recent SA.   She endorses epigastric and RUQ abdominal pain that has been present for the past several days. She reports that when in the hospital with COVID she was on Protonix and this helped with the pain. She would like to know if she can take this.   She reports that she saw Dr. Cyndy Freeze for her spine and was told that her pain is stemming from arthritis. She tells me he did recommend that she see a doctor about bilateral hip replacement as he believes that this may be causing some of her pain. She would like my opinion on this issue.   Past Medical History:  Diagnosis Date   Acute pulmonary embolism ([place]) 08/21/2019   Bradycardia    Breast cancer screening by mammogram 11/24/2020   CARCINOMA, THYROID GLAND, PAPILLARY 05/04/2008   Stage 2, 8/09: thyroidectomy for 2.7cm papillary adenocarcinoma (t2 n0 mo) 9/09: I-131 rx, 108 [facility] 05/10:  tg is neg (ab neg) , total body scan is neg   Colon cancer screening 11/24/2020   Colon polyps    Complication of anesthesia    trouble with airway   Cough 12/25/2007   Qualifier: Diagnosis of  By: Doy Mince LPN, Megan     [id number] 08/19/2019   Diverticulosis    DVT (deep venous thrombosis) ([place])    Edema 12/22/2008   Encounter to establish care 11/24/2020   History of 2019 novel coronavirus disease (COVID-19) 11/09/2019   HYPERTENSION 12/25/2007   HYPOTHYROIDISM, POSTSURGICAL 05/04/2008   LEG PAIN, LEFT 12/09/2008   Muscle weakness    Numbness and tingling    OSA (obstructive sleep apnea) 06/15/2013   CPAP   Peripheral edema 12/22/2008   Qualifier: Diagnosis of  By: Johnsie Cancel, MD, Rona Ravens    Pneumonia due to COVID-19 virus    Pulmonary embolism ([place])    Right lower quadrant pain 11/24/2020    Past Surgical History:  Procedure Laterality Date   ABDOMINAL HYSTERECTOMY     ANKLE SURGERY Right    CERVICAL FUSION     x 2   COLONOSCOPY WITH PROPOFOL N/A 08/08/2015   Procedure: COLONOSCOPY WITH PROPOFOL;  Surgeon: Juanita Craver, MD;  Location: [facility];  Service: Endoscopy;  Laterality: N/A;   KNEE SURGERY Left    TOTAL THYROIDECTOMY      Family History  Problem  Relation Age of Onset   Heart disease Mother    Kidney disease Mother    Other Father        unsure of medical history    Social History   Socioeconomic History   Marital status: Widowed    Spouse name: Not on file   Number of children: 3   Years of education: college   Highest education level: Master's degree (e.g., MA, MS, MEng, MEd, MSW, MBA)  Occupational History   Occupation: Teacher - Sport and exercise psychologist  Tobacco Use   Smoking status: Never   Smokeless tobacco: Never  Vaping Use   Vaping Use: Never used  Substance and Sexual Activity   Alcohol use: Yes    Comment: 1 every 1-2 months   Drug use: No   Sexual activity: Not Currently  Other Topics Concern   Not on file  Social History Narrative    Lives alone.   Right-handed.   No daily caffeine use.   Social Determinants of Health   Financial Resource Strain: Not on file  Food Insecurity: Not on file  Transportation Needs: Not on file  Physical Activity: Not on file  Stress: Not on file  Social Connections: Not on file  Intimate Partner Violence: Not on file    Outpatient Medications Prior to Visit  Medication Sig Dispense Refill   cyclobenzaprine (FLEXERIL) 5 MG tablet Take by mouth.     famotidine (PEPCID) 20 MG tablet      Multiple Vitamin (MULTIVITAMIN WITH MINERALS) TABS tablet Take 1 tablet by mouth daily.     naproxen (NAPROSYN) 500 MG tablet Take 1 tablet (500 mg total) by mouth 2 (two) times daily with a meal. 60 tablet 0   oxybutynin (DITROPAN-XL) 10 MG 24 hr tablet      tetrahydrozoline-zinc (VISINE-AC) 0.05-0.25 % ophthalmic solution      amLODipine (NORVASC) 5 MG tablet Take 1 tablet (5 mg total) by mouth at bedtime. 90 tablet 1   amLODipine (NORVASC) 5 MG tablet Take by mouth.     furosemide (LASIX) 20 MG tablet Take 1 tablet (20 mg total) by mouth 2 (two) times daily. 60 tablet 3   gabapentin (NEURONTIN) 300 MG capsule Take by mouth.     levothyroxine (SYNTHROID) 112 MCG tablet Take by mouth.     pantoprazole (PROTONIX) 40 MG tablet Take 1 tablet (40 mg total) by mouth daily. Take one tablet 30-60 minutes before breakfast. 30 tablet 3   pantoprazole (PROTONIX) 40 MG tablet Take by mouth.     SYNTHROID 112 MCG tablet Take 1 tablet (112 mcg total) by mouth every morning. 30 tablet 3   valsartan-hydrochlorothiazide (DIOVAN-HCT) 160-12.5 MG tablet Take 1 tablet by mouth daily. 90 tablet 1   rivaroxaban (XARELTO) 20 MG TABS tablet Take 1 tablet (20 mg total) by mouth daily with supper. 30 tablet 0   Vibegron (GEMTESA) 75 MG TABS Take 75 mg by mouth daily. 30 tablet 6   No facility-administered medications prior to visit.    Allergies  Allergen Reactions   Codeine Shortness Of Breath, Palpitations and Other  (See Comments)   Eggs Or Egg-Derived Products Nausea And Vomiting    PROJECTILE VOMITING    ROS Review of Systems All review of systems negative except what is listed in the HPI    Objective:    Physical Exam Vitals and nursing note reviewed.  Constitutional:      Appearance: She is well-developed. She is obese.  HENT:  Head: Normocephalic and atraumatic.  Eyes:     Extraocular Movements: Extraocular movements intact.     Pupils: Pupils are equal, round, and reactive to light.  Cardiovascular:     Rate and Rhythm: Normal rate and regular rhythm.     Heart sounds: Normal heart sounds.  Pulmonary:     Effort: Pulmonary effort is normal.     Breath sounds: Wheezing present.  Abdominal:     General: There is no distension or abdominal bruit.     Palpations: Abdomen is soft.     Tenderness: There is abdominal tenderness in the epigastric area.     Hernia: No hernia is present.  Genitourinary:    Vagina: Vaginal discharge present.  Skin:    General: Skin is warm and dry.     Capillary Refill: Capillary refill takes less than 2 seconds.  Neurological:     General: No focal deficit present.     Mental Status: She is alert and oriented to person, place, and time.     Cranial Nerves: No cranial nerve deficit.  Psychiatric:        Mood and Affect: Mood normal.        Behavior: Behavior normal.    BP (!) 150/85   Pulse 98   Ht 5\' 5"  (1.651 m)   Wt (!) 319 lb 9.6 oz (145 kg)   SpO2 98%   BMI 53.18 kg/m  Wt Readings from Last 3 Encounters:  03/16/21 (!) 319 lb 9.6 oz (145 kg)  01/18/21 (!) 319 lb (144.7 kg)  12/12/20 (!) 320 lb (145.2 kg)     Health Maintenance Due  Topic Date Due   Hepatitis C Screening  Never done   TETANUS/TDAP  Never done   Zoster Vaccines- Shingrix (1 of 2) Never done   DEXA SCAN  Never done   PNA vac Low Risk Adult (1 of 2 - PCV13) Never done   COVID-19 Vaccine (4 - Booster for Pfizer series) 10/18/2020    There are no preventive care  reminders to display for this patient.  Lab Results  Component Value Date   TSH 0.288 (L) 09/02/2019   Lab Results  Component Value Date   WBC 7.9 11/10/2020   HGB 12.5 11/10/2020   HCT 38.6 11/10/2020   MCV 90.8 11/10/2020   PLT 268 11/10/2020   Lab Results  Component Value Date   NA 140 11/10/2020   K 4.4 11/10/2020   CO2 24 11/10/2020   GLUCOSE 96 11/10/2020   BUN 16 11/10/2020   CREATININE 1.19 (H) 11/10/2020   BILITOT 0.5 11/10/2020   ALKPHOS 74 11/10/2020   AST 17 11/10/2020   ALT 10 11/10/2020   PROT 7.3 11/10/2020   ALBUMIN 3.9 11/10/2020   CALCIUM 9.0 11/10/2020   ANIONGAP 9 11/10/2020   Lab Results  Component Value Date   CHOL 174 09/04/2019   Lab Results  Component Value Date   HDL 76 09/04/2019   Lab Results  Component Value Date   LDLCALC 80 09/04/2019   Lab Results  Component Value Date   TRIG 89 09/04/2019   Lab Results  Component Value Date   CHOLHDL 2.3 09/04/2019   No results found for: HGBA1C    Assessment & Plan:   Problem List Items Addressed This Visit     Hypothyroidism, postsurgical    Will monitor labs today- it has been a while since these have been checked Concern for recurrence of cancer in patient due to hoarseness  present She is having hot flashes, which could indicate an issue with the thyroid replacement.  Will monitor labs and make changes to plan of care as necessary Consider thyroid US for further evaluation if hoarseness not resolving with GERD treatment       Relevant Medications   SYNTHROID 112 MCG tablet   Other Relevant Orders   Thyroid Panel With TSH   Primary hypertension    Not taking medication at this time- lost prescription for Diovan Refills provided today Labs today F/U in 3 months       Relevant Medications   oxybutynin (DITROPAN-XL) 10 MG 24 hr tablet   amLODipine (NORVASC) 5 MG tablet   valsartan-hydrochlorothiazide (DIOVAN-HCT) 160-12.5 MG tablet   furosemide (LASIX) 20 MG tablet    Other Relevant Orders   CBC with Differential/Platelet   Comprehensive metabolic panel   VITAMIN D 25 Hydroxy (Vit-D Deficiency, Fractures)   POCT glycosylated hemoglobin (Hb A1C)   Bilateral leg edema    Not taking lasix or BP medication LE edema present Recommend lasix on days that she is at home to help with swelling Keep legs propped up when sitting Compression stockings can be helpful during the day No weeping or skin breakdown present        Relevant Medications   furosemide (LASIX) 20 MG tablet   Gastroesophageal reflux disease - Primary    Restart pantoprazole for epigastric pain If symptoms persist- please let me know.        Relevant Medications   famotidine (PEPCID) 20 MG tablet   pantoprazole (PROTONIX) 40 MG tablet   Vitamin D deficiency    Recheck vitamin d levels today       Relevant Orders   VITAMIN D 25 Hydroxy (Vit-D Deficiency, Fractures)   Vaginal candidiasis    Presentation consistent with vaginal candidiasis Fluconazole treatment provided If symptoms persist, recommend f/u Vaginal exam deferred today due to limited mobility factors       Relevant Medications   fluconazole (DIFLUCAN) 150 MG tablet    Meds ordered this encounter  Medications   amLODipine (NORVASC) 5 MG tablet    Sig: Take 1 tablet (5 mg total) by mouth at bedtime. For Blood Pressure    Dispense:  30 tablet    Refill:  6   fluconazole (DIFLUCAN) 150 MG tablet    Sig: Take one tablet by mouth on day one and then second tablet 3 days later.    Dispense:  2 tablet    Refill:  1   valsartan-hydrochlorothiazide (DIOVAN-HCT) 160-12.5 MG tablet    Sig: Take 1 tablet by mouth daily. For blood pressure    Dispense:  30 tablet    Refill:  6   SYNTHROID 112 MCG tablet    Sig: Take 1 tablet (112 mcg total) by mouth every morning.    Dispense:  30 tablet    Refill:  6   furosemide (LASIX) 20 MG tablet    Sig: Take 1 tablet (20 mg total) by mouth 2 (two) times daily.    Dispense:   60 tablet    Refill:  6   pantoprazole (PROTONIX) 40 MG tablet    Sig: Take 1 tablet (40 mg total) by mouth daily.    Dispense:  30 tablet    Refill:  6    Follow-up: Return in about 4 weeks (around 04/13/2021) for GERD/Sore throat- virtual visit.    Orma Render, NP

## 2021-03-16 NOTE — Assessment & Plan Note (Signed)
>>  ASSESSMENT AND PLAN FOR BILATERAL LOWER EXTREMITY EDEMA WRITTEN ON 03/16/2021  6:45 PM BY Romain Erion E, NP  Not taking lasix  or BP medication LE edema present Recommend lasix  on days that she is at home to help with swelling Keep legs propped up when sitting Compression stockings can be helpful during the day No weeping or skin breakdown present

## 2021-03-16 NOTE — Assessment & Plan Note (Signed)
Recheck vitamin d levels today

## 2021-03-16 NOTE — Assessment & Plan Note (Signed)
Restart pantoprazole for epigastric pain If symptoms persist- please let me know.

## 2021-03-16 NOTE — Assessment & Plan Note (Signed)
Not taking medication at this time- lost prescription for Diovan Refills provided today Labs today F/U in 3 months

## 2021-03-16 NOTE — Assessment & Plan Note (Signed)
Presentation consistent with vaginal candidiasis Fluconazole treatment provided If symptoms persist, recommend f/u Vaginal exam deferred today due to limited mobility factors

## 2021-03-16 NOTE — Assessment & Plan Note (Signed)
Not taking lasix or BP medication LE edema present Recommend lasix on days that she is at home to help with swelling Keep legs propped up when sitting Compression stockings can be helpful during the day No weeping or skin breakdown present

## 2021-03-16 NOTE — Assessment & Plan Note (Signed)
Will monitor labs today- it has been a while since these have been checked Concern for recurrence of cancer in patient due to hoarseness present She is having hot flashes, which could indicate an issue with the thyroid replacement.  Will monitor labs and make changes to plan of care as necessary Consider thyroid US for further evaluation if hoarseness not resolving with GERD treatment

## 2021-03-16 NOTE — Patient Instructions (Addendum)
Take Every Day Medications:  amLODipine (NORVASC) 5 MG tablet: For Blood Pressure  valsartan-hydrochlorothiazide (DIOVAN-HCT) 160-12.5 MG tablet: For blood pressure.   levothyroxine (SYNTHROID) 112 MCG tablet: For your thyroid  pantoprazole (PROTONIX) 40 MG tablet: Take every day for chronic reflux. You can take it at any time.   oxybutynin (DITROPAN-XL) 10 MG 24 hr tablet: For frequent urination. Take every day.   Vibegron (GEMTESA) 75 MG TABS: For frequent urination.   rivaroxaban (XARELTO) 20 MG TABS tablet: For blood thinning.  Take When You Are Home Medications:  furosemide (LASIX) 20 MG tablet: For fluid in your legs. You can take this only when you are home for the day.   Take As Needed Medications:  cyclobenzaprine (FLEXERIL) 5 MG tablet: For Muscle Spasms. Can take at bedtime and up to twice a day for muscle spasms.   famotidine (PEPCID) 20 MG tablet: For acid reflux- good to use when you are having symptoms from something you have eaten.   gabapentin (NEURONTIN) 300 MG capsule: For nerve pain and back pain  naproxen (NAPROSYN) 500 MG tablet: For pain  tetrahydrozoline-zinc (VISINE-AC) 0.05-0.25 % ophthalmic solution     We will see what your labs look like and decide if anything looks like we need to make changes or look further about the sore throat. I want you to go ahead and start the pantoprazole and see if this helps and we will do a virtual visit in 4 weeks. (call the office to schedule [phone]) to see if it is any better.

## 2021-03-19 ENCOUNTER — Ambulatory Visit (INDEPENDENT_AMBULATORY_CARE_PROVIDER_SITE_OTHER): Payer: Medicare HMO | Admitting: Nurse Practitioner

## 2021-03-19 ENCOUNTER — Other Ambulatory Visit: Payer: Self-pay

## 2021-03-19 DIAGNOSIS — R0600 Dyspnea, unspecified: Secondary | ICD-10-CM

## 2021-03-19 DIAGNOSIS — K219 Gastro-esophageal reflux disease without esophagitis: Secondary | ICD-10-CM | POA: Diagnosis not present

## 2021-03-19 DIAGNOSIS — E890 Postprocedural hypothyroidism: Secondary | ICD-10-CM | POA: Diagnosis not present

## 2021-03-19 DIAGNOSIS — Z131 Encounter for screening for diabetes mellitus: Secondary | ICD-10-CM

## 2021-03-19 DIAGNOSIS — E559 Vitamin D deficiency, unspecified: Secondary | ICD-10-CM | POA: Diagnosis not present

## 2021-03-19 DIAGNOSIS — I10 Essential (primary) hypertension: Secondary | ICD-10-CM | POA: Diagnosis not present

## 2021-03-19 DIAGNOSIS — R0609 Other forms of dyspnea: Secondary | ICD-10-CM

## 2021-03-19 DIAGNOSIS — R600 Localized edema: Secondary | ICD-10-CM | POA: Diagnosis not present

## 2021-03-19 DIAGNOSIS — R109 Unspecified abdominal pain: Secondary | ICD-10-CM | POA: Diagnosis not present

## 2021-03-19 LAB — POCT GLYCOSYLATED HEMOGLOBIN (HGB A1C): HbA1c POC (<> result, manual entry): 5.1 % (ref 4.0–5.6)

## 2021-03-19 NOTE — Progress Notes (Signed)
Lab visit only. 

## 2021-03-19 NOTE — Addendum Note (Signed)
Addended by: Virginia Crews D on: 03/19/2021 10:30 AM   Modules accepted: Orders

## 2021-03-20 ENCOUNTER — Telehealth (HOSPITAL_BASED_OUTPATIENT_CLINIC_OR_DEPARTMENT_OTHER): Payer: Self-pay

## 2021-03-20 ENCOUNTER — Other Ambulatory Visit (HOSPITAL_BASED_OUTPATIENT_CLINIC_OR_DEPARTMENT_OTHER): Payer: Self-pay | Admitting: Nurse Practitioner

## 2021-03-20 DIAGNOSIS — R1011 Right upper quadrant pain: Secondary | ICD-10-CM

## 2021-03-20 DIAGNOSIS — R748 Abnormal levels of other serum enzymes: Secondary | ICD-10-CM

## 2021-03-20 LAB — CBC WITH DIFFERENTIAL/PLATELET
Basophils Absolute: 0.1 10*3/uL (ref 0.0–0.2)
Basos: 2 %
EOS (ABSOLUTE): 0.2 10*3/uL (ref 0.0–0.4)
Eos: 3 %
Hematocrit: 43.9 % (ref 34.0–46.6)
Hemoglobin: 14.7 g/dL (ref 11.1–15.9)
Immature Grans (Abs): 0 10*3/uL (ref 0.0–0.1)
Immature Granulocytes: 0 %
Lymphocytes Absolute: 2.3 10*3/uL (ref 0.7–3.1)
Lymphs: 38 %
MCH: 30.1 pg (ref 26.6–33.0)
MCHC: 33.5 g/dL (ref 31.5–35.7)
MCV: 90 fL (ref 79–97)
Monocytes Absolute: 0.6 10*3/uL (ref 0.1–0.9)
Monocytes: 10 %
Neutrophils Absolute: 2.9 10*3/uL (ref 1.4–7.0)
Neutrophils: 47 %
Platelets: 258 10*3/uL (ref 150–450)
RBC: 4.89 x10E6/uL (ref 3.77–5.28)
RDW: 13.2 % (ref 11.7–15.4)
WBC: 6.2 10*3/uL (ref 3.4–10.8)

## 2021-03-20 LAB — COMPREHENSIVE METABOLIC PANEL
ALT: 18 IU/L (ref 0–32)
AST: 16 IU/L (ref 0–40)
Albumin/Globulin Ratio: 1.6 (ref 1.2–2.2)
Albumin: 4.8 g/dL (ref 3.8–4.8)
Alkaline Phosphatase: 134 IU/L — ABNORMAL HIGH (ref 44–121)
BUN/Creatinine Ratio: 18 (ref 12–28)
BUN: 16 mg/dL (ref 8–27)
Bilirubin Total: 0.3 mg/dL (ref 0.0–1.2)
CO2: 23 mmol/L (ref 20–29)
Calcium: 9.6 mg/dL (ref 8.7–10.3)
Chloride: 100 mmol/L (ref 96–106)
Creatinine, Ser: 0.88 mg/dL (ref 0.57–1.00)
Globulin, Total: 3 g/dL (ref 1.5–4.5)
Glucose: 68 mg/dL (ref 65–99)
Potassium: 3.8 mmol/L (ref 3.5–5.2)
Sodium: 139 mmol/L (ref 134–144)
Total Protein: 7.8 g/dL (ref 6.0–8.5)
eGFR: 71 mL/min/{1.73_m2} (ref >59)

## 2021-03-20 LAB — VITAMIN D 25 HYDROXY (VIT D DEFICIENCY, FRACTURES): Vit D, 25-Hydroxy: 25.6 ng/mL — ABNORMAL LOW (ref 30.0–100.0)

## 2021-03-20 LAB — TSH: TSH: 2.13 u[IU]/mL (ref 0.450–4.500)

## 2021-03-20 NOTE — Telephone Encounter (Signed)
-----   Message from Orma Render, NP sent at 03/20/2021  1:32 PM EDT ----- Please call patient:  There is an elevation in one of your liver enzymes that could indicate something is going on with your gallbladder. I would like you to have an ultrasound done of the liver and gall bladder so we can evaluate this further. The ultrasound a few months ago was ok- but given these new findings I want to focus specifically on the liver and gallbladder.   Please ask her if she is OK going to [facility] to have this done and let me know and I will place the orders.

## 2021-03-20 NOTE — Progress Notes (Signed)
Please call patient:  There is an elevation in one of your liver enzymes that could indicate something is going on with your gallbladder. I would like you to have an ultrasound done of the liver and gall bladder so we can evaluate this further. The ultrasound a few months ago was ok- but given these new findings I want to focus specifically on the liver and gallbladder.   Please ask her if she is OK going to [facility] to have this done and let me know and I will place the orders.

## 2021-03-20 NOTE — Telephone Encounter (Signed)
Called patient to discuss lab results and recommendations.  Patient is aware and understands.  Patient was instructed to contact the office with questions and concerns.

## 2021-04-04 ENCOUNTER — Other Ambulatory Visit: Payer: Self-pay

## 2021-04-04 ENCOUNTER — Ambulatory Visit
Admission: RE | Admit: 2021-04-04 | Discharge: 2021-04-04 | Disposition: A | Discharge status: Home / Self Care | Payer: Medicare HMO | Source: Ambulatory Visit | Attending: Nurse Practitioner | Admitting: Nurse Practitioner

## 2021-04-04 DIAGNOSIS — R1011 Right upper quadrant pain: Secondary | ICD-10-CM

## 2021-04-04 DIAGNOSIS — R748 Abnormal levels of other serum enzymes: Secondary | ICD-10-CM

## 2021-04-10 DIAGNOSIS — M25551 Pain in right hip: Secondary | ICD-10-CM | POA: Diagnosis not present

## 2021-04-10 NOTE — Progress Notes (Signed)
Please call Natasha [name]  Ultrasound does not show any issues with the gallbladder or lesions in the liver present. There is evidence of mild fatty liver present. The ultrasound doesn't really give [place] a good picture of what is causing the pain she is experiencing unless this is coming from the fatty liver.   It may be helpful to see a GI specialist to discuss this pain and the fatty liver and determine if they have any recommendations. If she is agreeable- please send referral to GI for fatty liver and LUQ abdominal pain.

## 2021-04-11 ENCOUNTER — Telehealth (HOSPITAL_BASED_OUTPATIENT_CLINIC_OR_DEPARTMENT_OTHER): Payer: Self-pay

## 2021-04-11 DIAGNOSIS — R1012 Left upper quadrant pain: Secondary | ICD-10-CM

## 2021-04-11 DIAGNOSIS — K760 Fatty (change of) liver, not elsewhere classified: Secondary | ICD-10-CM

## 2021-04-11 NOTE — Telephone Encounter (Signed)
Called patient to discuss ultrasound results. Patient is aware and agreeable to recommendation. GI referral placed for fatty liver and LUQ abdominal pain.

## 2021-04-11 NOTE — Telephone Encounter (Signed)
-----   Message from Orma Render, NP sent at 04/10/2021  8:23 AM EDT ----- Please call Natasha [name]  Ultrasound does not show any issues with the gallbladder or lesions in the liver present. There is evidence of mild fatty liver present. The ultrasound doesn't really give [place] a good picture of what is causing the pain she is experiencing unless this is coming from the fatty liver.   It may be helpful to see a GI specialist to discuss this pain and the fatty liver and determine if they have any recommendations. If she is agreeable- please send referral to GI for fatty liver and LUQ abdominal pain.

## 2021-04-17 DIAGNOSIS — M25552 Pain in left hip: Secondary | ICD-10-CM | POA: Diagnosis not present

## 2021-04-17 DIAGNOSIS — M25551 Pain in right hip: Secondary | ICD-10-CM | POA: Diagnosis not present

## 2021-04-19 ENCOUNTER — Encounter (HOSPITAL_BASED_OUTPATIENT_CLINIC_OR_DEPARTMENT_OTHER): Payer: Self-pay | Admitting: Nurse Practitioner

## 2021-04-19 NOTE — Telephone Encounter (Signed)
Please call and see if she can come to the office tomorrow afternoon we can put her in my 310 or 330 slot. Or we can do a virtual visit with her at 310 or 330. Let her know we have sent the GI referral. If she has not heard from them, then we need to check with Judson Roch on that.   She can stop tylenol- I recommend that for now.   Tumeric can make acid reflux worse- if she is taking this, I recommend stopping for now.

## 2021-04-20 ENCOUNTER — Other Ambulatory Visit: Payer: Self-pay

## 2021-04-20 ENCOUNTER — Encounter (HOSPITAL_BASED_OUTPATIENT_CLINIC_OR_DEPARTMENT_OTHER): Payer: Self-pay | Admitting: Nurse Practitioner

## 2021-04-20 ENCOUNTER — Telehealth (HOSPITAL_BASED_OUTPATIENT_CLINIC_OR_DEPARTMENT_OTHER): Payer: Self-pay

## 2021-04-20 ENCOUNTER — Telehealth (INDEPENDENT_AMBULATORY_CARE_PROVIDER_SITE_OTHER): Payer: Medicare HMO | Admitting: Nurse Practitioner

## 2021-04-20 VITALS — Ht 67.0 in

## 2021-04-20 DIAGNOSIS — R32 Unspecified urinary incontinence: Secondary | ICD-10-CM | POA: Diagnosis not present

## 2021-04-20 DIAGNOSIS — G8929 Other chronic pain: Secondary | ICD-10-CM

## 2021-04-20 DIAGNOSIS — N3281 Overactive bladder: Secondary | ICD-10-CM | POA: Diagnosis not present

## 2021-04-20 DIAGNOSIS — R1011 Right upper quadrant pain: Secondary | ICD-10-CM | POA: Insufficient documentation

## 2021-04-20 DIAGNOSIS — K760 Fatty (change of) liver, not elsewhere classified: Secondary | ICD-10-CM

## 2021-04-20 DIAGNOSIS — J3489 Other specified disorders of nose and nasal sinuses: Secondary | ICD-10-CM | POA: Diagnosis not present

## 2021-04-20 DIAGNOSIS — R062 Wheezing: Secondary | ICD-10-CM | POA: Diagnosis not present

## 2021-04-20 DIAGNOSIS — K219 Gastro-esophageal reflux disease without esophagitis: Secondary | ICD-10-CM | POA: Diagnosis not present

## 2021-04-20 MED ORDER — MONTELUKAST SODIUM 10 MG PO TABS: 10.0000 mg | ORAL_TABLET | Freq: Every day | ORAL | 3 refills | Status: DC

## 2021-04-20 MED ORDER — TRAMADOL HCL 50 MG PO TABS: ORAL_TABLET | ORAL | 0 refills | Status: DC

## 2021-04-20 MED ORDER — MYRBETRIQ 25 MG PO TB24: 25.0000 mg | ORAL_TABLET | Freq: Every day | ORAL | 3 refills | Status: DC

## 2021-04-20 NOTE — Assessment & Plan Note (Addendum)
Increased mucous production and drainage with wheezing.  At this time it appears to be an allergy issues, but also consider GERD.  Will add montelukast to see if this will help with her symptoms No signs of fluid overload right now.  Will monitor for improvement with new medications.

## 2021-04-20 NOTE — Progress Notes (Signed)
Virtual Visit via Telephone Note  I connected with  Natasha [name] on 04/20/21 at  3:10 PM EDT by telephone and verified that I am speaking with the correct person using two identifiers.   I discussed the limitations, risks, security and privacy concerns of performing an evaluation and management service by telephone and the availability of in person appointments. I also discussed with the patient that there may be a patient responsible charge related to this service. The patient expressed understanding and agreed to proceed.  Participating parties included in this telephone visit include: The patient and the nurse practitioner listed.  The patient is: At home I am: In the office  Subjective:    CC and HPI: Natasha [name] is a 70 y.o. year old female presenting today via telephone visit to discuss RUQ pain. Natasha [name] has been having RUQ pain for several months now on and off. She is also dealing with significant hip and back pain. She tells me she has been taking acetaminophen daily for the joint pain and she noticed the "liver pain" getting worse and staying. She was also taking turmeric. She has not had any pale stools that she knows of, but her pain is radiating to her back. She does have increased phlegm and heartburn.  No bleeding in stools or emesis.  Nothing seems to make the pain worse or better. She tells me she stopped the tylenol ad turmeric yesterday after reaching out to me about the pain.  She has not heard from GI- a referral was sent for fatty liver and GERD on the 10th of this month.   She also tells me she has noticed some wheezing recently and is not sure if that is GERD or allergies.  She is also having worsening incontinence and is out of her myrbetriq. She had excellent luck with [facility], but this is over $400 a month and she cannot continue taking this, understandably.  No dysuria, wet cough, increased edema.  Past medical history, Surgical history, Family history not  pertinant except as noted below, Social history, Allergies, and medications have been entered into the medical record, reviewed, and corrections made.   Review of Systems:  All review of systems negative except what is listed in the HPI  Objective:    Patient speaking clearly in complete sentences. No shortness of breath noted.  She does have some wheezing present.  Alert and oriented x3.   Normal judgment.  No apparent acute distress.  Impression and Recommendations:   Gastroesophageal reflux disease Recommend restarting pantoprazole and take one time every single day to prevent GERD symptoms. Unclear at this time if increased phlegm is related to reflux exacerbation or allergy symptoms.  Recommend contacting GI office to set up appointment to be seen in near future.   Fatty liver GI referral sent- found on MRI Having RUQ pain at this time, but no alarm symptoms present.  Has been taking large doses of tylenol, which she has now stopped- I am sure this will help. Her pain has prevented her from regular exercise, but that seems to be improved with injections recently so I am hopeful we can get her moving to help reduce some of her weight. Her liver enzymes were not elevated at last check, but with recent Tylenol usage, we need to recheck these next week if she can't get into GI soon.   OAB (overactive bladder) Exacerbated while out of Myrbetriq- refill on Myrbetriq today Follow-up if symptoms do not improve  Other chronic pain Patient has safely taken Tramadol in the past Will trial low doses short term for her pain to see if we can get this under better control Recommend taking the least amount possible for pain If symptoms worsen- let me know.   Abdominal pain, RUQ Unclear if this is GERD or Liver/Gallbladder related at this time Trying to avoid further imaging as she has had multiple imaging performed over last few months This pain appears different than the pain she has  experienced in the past. The radiation appears more consistent with possible gallbladder, but no alarm symptoms present at this time. She does have referral to GI- recommend she contact them in the event she has missed their call to schedule an appointment.  If the pain worsens, she begins to have nausea or vomiting, if her stool colors change, or if she begins to have loose stools I recommend going to the hospital for evaluation as this could indicate a more serious problem.  If unable to get in with GI in the next week to 10 days will draw labs given her recent increased tylenol usage. Symptoms not consistent with pancreas, but will add lipase levels for monitoring if labs drawn.   Incontinence of urine in female Urinary frequency with chronic incontinence worse with being off myrbetriq. Refills provided No UTI symptoms present, but need to watch for this as she does wear briefs during the night.   Sinus drainage Increased mucous production and drainage with wheezing.  At this time it appears to be an allergy issues, but also consider GERD.  Will add montelukast to see if this will help with her symptoms No signs of fluid overload right now.  Will monitor for improvement with new medications.   Wheezing Wheezing with no signs of fluid overload present at this time.  She is not short of breath and is speaking in complete sentences Has been off protonix for a while, but also could be allergy symptoms given her increased mucous production.  Recommend restart protonix and will start montelukast for allergies Recommend f/u if symptoms worsen or fail to improve.      I discussed the assessment and treatment plan with the patient. The patient was provided an opportunity to ask questions and all were answered. The patient agreed with the plan and demonstrated an understanding of the instructions.   The patient was advised to call back or seek an in-person evaluation if the symptoms worsen or if  the condition fails to improve as anticipated.  I provided 35 minutes of non-face-to-face time during this TELEPHONE encounter.    Orma Render, NP

## 2021-04-20 NOTE — Assessment & Plan Note (Signed)
Recommend restarting pantoprazole and take one time every single day to prevent GERD symptoms. Unclear at this time if increased phlegm is related to reflux exacerbation or allergy symptoms.  Recommend contacting GI office to set up appointment to be seen in near future.

## 2021-04-20 NOTE — Telephone Encounter (Signed)
Patient called stating she is having liver pain.  Per provider patient is stop taking Tylenol and Turmeric and schedule a virtual visit.  Virtual visit scheduled for 04/20/21 @ 3:10.

## 2021-04-20 NOTE — Patient Instructions (Signed)
Call the [facility] office at [phone] to schedule your appointment with them. We sent a referral over on 04/11/2021 so they may have tried to reach you, but havent gotten through.   Let me know if anything changes or if your pain worsens.

## 2021-04-20 NOTE — Assessment & Plan Note (Signed)
Wheezing with no signs of fluid overload present at this time.  She is not short of breath and is speaking in complete sentences Has been off protonix for a while, but also could be allergy symptoms given her increased mucous production.  Recommend restart protonix and will start montelukast for allergies Recommend f/u if symptoms worsen or fail to improve.

## 2021-04-20 NOTE — Assessment & Plan Note (Signed)
Patient has safely taken Tramadol in the past Will trial low doses short term for her pain to see if we can get this under better control Recommend taking the least amount possible for pain If symptoms worsen- let me know.

## 2021-04-20 NOTE — Assessment & Plan Note (Signed)
GI referral sent- found on MRI Having RUQ pain at this time, but no alarm symptoms present.  Has been taking large doses of tylenol, which she has now stopped- I am sure this will help. Her pain has prevented her from regular exercise, but that seems to be improved with injections recently so I am hopeful we can get her moving to help reduce some of her weight. Her liver enzymes were not elevated at last check, but with recent Tylenol usage, we need to recheck these next week if she can't get into GI soon.

## 2021-04-20 NOTE — Assessment & Plan Note (Addendum)
>>  ASSESSMENT AND PLAN FOR INCONTINENCE OF URINE IN FEMALE WRITTEN ON 04/20/2021  4:05 PM BY Kevona Lupinacci E, NP  Urinary frequency with chronic incontinence worse with being off myrbetriq . Refills provided No UTI symptoms present, but need to watch for this as she does wear briefs during the night.    >>ASSESSMENT AND PLAN FOR OAB (OVERACTIVE BLADDER) WRITTEN ON 04/20/2021  4:00 PM BY Kaely Hollan E, NP  Exacerbated while out of Myrbetriq - refill on Myrbetriq  today Follow-up if symptoms do not improve

## 2021-04-20 NOTE — Assessment & Plan Note (Signed)
Exacerbated while out of Myrbetriq- refill on Myrbetriq today Follow-up if symptoms do not improve

## 2021-04-20 NOTE — Assessment & Plan Note (Signed)
Unclear if this is GERD or Liver/Gallbladder related at this time Trying to avoid further imaging as she has had multiple imaging performed over last few months This pain appears different than the pain she has experienced in the past. The radiation appears more consistent with possible gallbladder, but no alarm symptoms present at this time. She does have referral to GI- recommend she contact them in the event she has missed their call to schedule an appointment.  If the pain worsens, she begins to have nausea or vomiting, if her stool colors change, or if she begins to have loose stools I recommend going to the hospital for evaluation as this could indicate a more serious problem.  If unable to get in with GI in the next week to 10 days will draw labs given her recent increased tylenol usage. Symptoms not consistent with pancreas, but will add lipase levels for monitoring if labs drawn.

## 2021-04-23 ENCOUNTER — Ambulatory Visit (HOSPITAL_BASED_OUTPATIENT_CLINIC_OR_DEPARTMENT_OTHER): Payer: Medicare HMO | Admitting: Nurse Practitioner

## 2021-04-26 DIAGNOSIS — M25551 Pain in right hip: Secondary | ICD-10-CM | POA: Diagnosis not present

## 2021-04-30 ENCOUNTER — Ambulatory Visit (INDEPENDENT_AMBULATORY_CARE_PROVIDER_SITE_OTHER): Payer: Medicare HMO | Admitting: Cardiology

## 2021-04-30 ENCOUNTER — Encounter (HOSPITAL_BASED_OUTPATIENT_CLINIC_OR_DEPARTMENT_OTHER): Payer: Self-pay | Admitting: Cardiology

## 2021-04-30 ENCOUNTER — Other Ambulatory Visit: Payer: Self-pay

## 2021-04-30 VITALS — BP 156/82 | HR 65 | Ht 67.0 in | Wt 307.2 lb

## 2021-04-30 DIAGNOSIS — Z7189 Other specified counseling: Secondary | ICD-10-CM

## 2021-04-30 DIAGNOSIS — R600 Localized edema: Secondary | ICD-10-CM

## 2021-04-30 DIAGNOSIS — Z86711 Personal history of pulmonary embolism: Secondary | ICD-10-CM

## 2021-04-30 DIAGNOSIS — Z8616 Personal history of COVID-19: Secondary | ICD-10-CM

## 2021-04-30 DIAGNOSIS — I10 Essential (primary) hypertension: Secondary | ICD-10-CM | POA: Diagnosis not present

## 2021-04-30 MED ORDER — FUROSEMIDE 20 MG PO TABS
20.0000 mg | ORAL_TABLET | Freq: Two times a day (BID) | ORAL | 11 refills | Status: DC | PRN
Start: 2021-04-30 — End: 2022-01-25

## 2021-04-30 MED ORDER — VALSARTAN-HYDROCHLOROTHIAZIDE 160-12.5 MG PO TABS: 1.0000 | ORAL_TABLET | Freq: Every day | ORAL | 11 refills | Status: DC

## 2021-04-30 MED ORDER — RIVAROXABAN 20 MG PO TABS: 20.0000 mg | ORAL_TABLET | Freq: Every day | ORAL | 11 refills | Status: DC

## 2021-04-30 MED ORDER — AMLODIPINE BESYLATE 5 MG PO TABS: 5.0000 mg | ORAL_TABLET | Freq: Every day | ORAL | 11 refills | Status: DC

## 2021-04-30 NOTE — Patient Instructions (Signed)
Medication Instructions:  Your physician recommends that you continue on your current medications as directed. Please refer to the Current Medication list given to you today.  *If you need a refill on your cardiac medications before your next appointment, please call your pharmacy*   Follow-Up: At [facility], you and your health needs are our priority.  As part of our continuing mission to provide you with exceptional heart care, we have created designated Provider Care Teams.  These Care Teams include your primary Cardiologist (physician) and Advanced Practice Providers (APPs -  Physician Assistants and Nurse Practitioners) who all work together to provide you with the care you need, when you need it.  We recommend signing up for the patient portal called "MyChart".  Sign up information is provided on this After Visit Summary.  MyChart is used to connect with patients for Virtual Visits (Telemedicine).  Patients are able to view lab/test results, encounter notes, upcoming appointments, etc.  Non-urgent messages can be sent to your provider as well.   To learn more about what you can do with MyChart, go to [email or web address].    Your next appointment:   3 month(s)  The format for your next appointment:   In Person  Provider:   Buford Dresser, MD Or APP

## 2021-04-30 NOTE — Progress Notes (Signed)
Cardiology Office Note:    Date:  04/30/2021   ID:  Natasha [name], DOB 03/25/1951, MRN [id number]  PCP:  Orma Render, NP  Cardiologist:  Buford Dresser, MD  Referring MD: Orma Render, NP   CC: follow up  History of Present Illness:    Natasha [name] is a 70 y.o. female with a hx of Covid-19 infection in 08/2019, pulmonary embolism, hypertension, hyperthyroidism who is seen for follow up. I initially saw her 11/17/19 as a new consult at the request of Natasha Lei, MD for the evaluation and management of post-Covid/post-PE management and bradycardia.  History: She required hospitalization for Covid pneumonia from 08/21/19-09/06/19. She received steroids and remdesivir. During her admission, she was diagnosed with submassive PE and started on rivaroxaban. She was recommended for cardiology post discharge follow up for bradycardia and monitoring of RV function.  She was seen by Dr. Radford Pax during her admission, note dated 08/23/19. Noted to have sinus bradycardia in the 40s and 50s.   Today: Overall, she has not been feeling well and is under stress. Lately she has not been checking her blood pressure at home. She has also not eaten yet this morning.  Sometimes she has chest discomfort that she describes as a "squeeze". This does improve with massaging the area. She has started reflux medication, which also seems to help.   She is having abdominal pain, which she attributes to her liver. She wonders if this could be do to taking too much Advil. The pain is constant and sometimes improves after she eats.   She has bruising across her right hip and groin region from recent injections for pain management.  She is not able to lie on her left side while sleeping at night. If she does she becomes very stiff.  For her diet, she is interested in following the [place] program.  She denies any palpitations, or shortness of breath. No lightheadedness, headaches, syncope, orthopnea, or PND.    Today she needs refills of her medication. Also, she reports her incontinence medication has become too expensive.   Past Medical History:  Diagnosis Date   Abdominal spasms 12/14/2020   Acute pulmonary embolism ([place]) 08/21/2019   Bacterial conjunctivitis of both eyes 12/14/2020   Bradycardia    Breast cancer screening by mammogram 11/24/2020   CARCINOMA, THYROID GLAND, PAPILLARY 05/04/2008   Stage 2, 8/09: thyroidectomy for 2.7cm papillary adenocarcinoma (t2 n0 mo) 9/09: I-131 rx, 108 [facility] 05/10: tg is neg (ab neg) , total body scan is neg   Colon cancer screening 11/24/2020   Colon polyps    Complication of anesthesia    trouble with airway   Cough 12/25/2007   Qualifier: Diagnosis of  By: Natasha Mince LPN, Natasha [name]     [id number] 08/19/2019   Diverticulosis    DVT (deep venous thrombosis) ([place])    Edema 12/22/2008   Encounter to establish care 11/24/2020   History of 2019 novel coronavirus disease (COVID-19) 11/09/2019   HYPERTENSION 12/25/2007   HYPOTHYROIDISM, POSTSURGICAL 05/04/2008   LEG PAIN, LEFT 12/09/2008   Memory loss due to medical condition 11/09/2019   Muscle weakness    Numbness and tingling    OSA (obstructive sleep apnea) 06/15/2013   CPAP   Peripheral edema 12/22/2008   Qualifier: Diagnosis of  By: Natasha Cancel, MD, Natasha [name]    Pneumonia due to COVID-19 virus    Pulmonary embolism ([place])    Right lower quadrant pain 11/24/2020   Skin sensation disturbance 12/05/2008  Qualifier: Diagnosis of  By: Natasha Lente MD, Natasha [name] of this note might be different from the original. Qualifier: Diagnosis of  By: Natasha Lente MD, Natasha [name] A   Spasm of muscle of lower back 12/14/2020   Vaginal candidiasis 03/16/2021    Past Surgical History:  Procedure Laterality Date   ABDOMINAL HYSTERECTOMY     ANKLE SURGERY Right    CERVICAL FUSION     x 2   COLONOSCOPY WITH PROPOFOL N/A 08/08/2015   Procedure: COLONOSCOPY WITH PROPOFOL;  Surgeon: Juanita Craver, MD;  Location: [facility];   Service: Endoscopy;  Laterality: N/A;   KNEE SURGERY Left    TOTAL THYROIDECTOMY      Current Medications: Current Outpatient Medications on File Prior to Visit  Medication Sig   cyclobenzaprine (FLEXERIL) 5 MG tablet Take by mouth.   famotidine (PEPCID) 20 MG tablet    Krill Oil 1000 MG CAPS Take 1,000 mg by mouth 2 (two) times daily.   montelukast (SINGULAIR) 10 MG tablet Take 1 tablet (10 mg total) by mouth at bedtime.   Multiple Vitamin (MULTIVITAMIN WITH MINERALS) TABS tablet Take 1 tablet by mouth daily.   MYRBETRIQ 25 MG TB24 tablet Take 1 tablet (25 mg total) by mouth daily.   pantoprazole (PROTONIX) 40 MG tablet Take 1 tablet (40 mg total) by mouth daily.   SYNTHROID 112 MCG tablet Take 1 tablet (112 mcg total) by mouth every morning.   tetrahydrozoline-zinc (VISINE-AC) 0.05-0.25 % ophthalmic solution    traMADol (ULTRAM) 50 MG tablet 1-2 tabs ('50mg'$  -'100mg'$ ) up to every 8 hours for pain. No more than 5 tabs per day   No current facility-administered medications on file prior to visit.     Allergies:   Codeine and Eggs or egg-derived products   Social History   Tobacco Use   Smoking status: Never   Smokeless tobacco: Never  Vaping Use   Vaping Use: Never used  Substance Use Topics   Alcohol use: Yes    Comment: 1 every 1-2 months   Drug use: No    Family History: family history includes Heart disease in her mother; Kidney disease in her mother; Other in her father.  ROS:   Please see the history of present illness.   (+) Stress (+) Myalgias (+) Chest discomfort Additional pertinent ROS otherwise unremarkable.  EKGs/Labs/Other Studies Reviewed:    The following studies were reviewed today:  Echo 12/22/19  1. Left ventricular ejection fraction, by estimation, is 60 to 65%. The  left ventricle has normal function. The left ventricle has no regional  wall motion abnormalities. Left ventricular diastolic parameters are  indeterminate.   2. Right ventricular  systolic function is normal. The right ventricular  size is not well visualized. Tricuspid regurgitation signal is inadequate  for assessing PA pressure.   3. The mitral valve is grossly normal. Trivial mitral valve  regurgitation. No evidence of mitral stenosis.   4. The aortic valve was not well visualized. Aortic valve regurgitation  is not visualized. No aortic stenosis is present.   5. The inferior vena cava is normal in size with greater than 50%  respiratory variability, suggesting right atrial pressure of 3 mmHg.   Comparison(s): No significant change from prior study.   Monitor 12/31/19 ~7 days of data recorded on Zio monitor. Patient had a min HR of 44 bpm, max HR of 174 bpm, and avg HR of 67 bpm. Predominant underlying rhythm was Sinus Rhythm. No VT, atrial fibrillation, high degree  block, or pauses noted. Isolated atrial and ventricular ectopy was rare (<1%). There were 5 triggered events. These were sinus rhythm with/without PVC. There were 10 SVT events, the fastest interval lasting 7 beats with a max rate of 174 bpm, the longest 21 beats/9.3 secs with an avg rate of 131 bpm.  Echo 08/22/19 1. Left ventricular ejection fraction, by visual estimation, is 65 to  70%. The left ventricle has hyperdynamic function. There is no left  ventricular hypertrophy.   2. The left ventricle has no regional wall motion abnormalities.   3. Global right ventricle has low normal systolic function.The right  ventricular size is mildly enlarged. No increase in right ventricular wall  thickness.   4. Presence of pericardial fat pad.   5. The mitral valve is grossly normal. Trivial mitral valve  regurgitation.   6. The tricuspid valve is grossly normal. Tricuspid valve regurgitation  is not demonstrated.   7. The tricuspid valve was normal in structure. Tricuspid valve  regurgitation is not demonstrated.   8. The aortic valve is grossly normal. Aortic valve regurgitation is not  visualized. No  evidence of aortic valve sclerosis or stenosis.   9. The inferior vena cava is dilated in size with <50% respiratory  variability, suggesting right atrial pressure of 15 mmHg.  10. Technically challenging study. Normal LV function. RV appears to be  mildly enlarged, with borderline normal function. Unable to visualize PA  well.  11. The interatrial septum was not assessed.   EKG:  EKG is personally reviewed.   04/30/2021: not ordered 01/18/2021: NSR at 66 bpm, PRWP 11/17/19: NSR  Recent Labs: 03/19/2021: ALT 18; BUN 16; Creatinine, Ser 0.88; Hemoglobin 14.7; Platelets 258; Potassium 3.8; Sodium 139; TSH 2.130  Recent Lipid Panel    Component Value Date/Time   CHOL 174 09/04/2019 1230   TRIG 89 09/04/2019 1230   HDL 76 09/04/2019 1230   CHOLHDL 2.3 09/04/2019 1230   VLDL 18 09/04/2019 1230   LDLCALC 80 09/04/2019 1230    Physical Exam:    VS:  BP (!) 156/82   Pulse 65   Ht '5\' 7"'$  (1.702 m)   Wt (!) 307 lb 3.2 oz (139.3 kg)   SpO2 94%   BMI 48.11 kg/m     Wt Readings from Last 3 Encounters:  04/30/21 (!) 307 lb 3.2 oz (139.3 kg)  03/16/21 (!) 319 lb 9.6 oz (145 kg)  01/18/21 (!) 319 lb (144.7 kg)    GEN: Well nourished, well developed in no acute distress HEENT: Normal, moist mucous membranes NECK: No JVD CARDIAC: regular rhythm, normal S1 and S2, no rubs or gallops. No murmur. VASCULAR: Radial and DP pulses 2+ bilaterally. No carotid bruits RESPIRATORY:  Clear to auscultation without rales, wheezing or rhonchi  ABDOMEN: Soft, non-tender, non-distended MUSCULOSKELETAL:  Ambulates independently SKIN: Warm and dry, bilateral mild nonpitting edema NEUROLOGIC:  Alert and oriented x 3. No focal neuro deficits noted. PSYCHIATRIC:  Normal affect   ASSESSMENT:    1. Primary hypertension   2. Bilateral leg edema   3. History of pulmonary embolism   4. Morbid obesity ([place])   5. History of 2019 novel coronavirus disease (COVID-19)   6. Cardiac risk counseling   7.  Counseling on health promotion and disease prevention     PLAN:    History of Covid pneumonia and pulmonary embolism, with evidence of right sided heart failure at diagnosis:  -echo 12/2019, right atrial pressure normal, TR inadequate for RVSP, RV normal -  counseled on need to continue using CPAP for OSA -counseled on daily weights, salt avoidance -counseled on compression stockings and leg elevation. With normal right atrial pressure, suspect component of chronic venous insufficiency -continue furosemide 20 mg BID PRN for worsening swelling -remains on rivaroxaban, likely lifelong  Hypertension: elevated today -she reports that it has been better controlled, but she is under a lot of stress at the moment -continued amlodipine. Do not suspect that 5 mg dose is contributing significantly to LE edema -continue valsartan-HCTZ daily  Morbid obesity:  -appreciate pharmacy assistance, copay for [place] too high for her while she is in the donut hole. She does have a history of papillary thyroid cancer (not medullary).  Cardiac risk counseling and prevention recommendations: -recommend heart healthy/Mediterranean diet, with whole grains, fruits, vegetable, fish, lean meats, nuts, and olive oil. Limit salt. -recommend moderate walking, 3-5 times/week for 30-50 minutes each session. Aim for at least 150 minutes.week. Goal should be pace of 3 miles/hours, or walking 1.5 miles in 30 minutes. This is a long term goal. -recommend avoidance of tobacco products. Avoid excess alcohol. -ASCVD risk score: she denies diabetes (A1c 5.8), so this is an overestimate The 10-year ASCVD risk score Mikey Bussing DC Jr., et al., 2013) is: 33.5%   Values used to calculate the score:     Age: 37 years     Sex: Female     Is Non-Hispanic African American: Yes     Diabetic: Yes     Tobacco smoker: No     Systolic Blood Pressure: [id number] mmHg     Is BP treated: Yes     HDL Cholesterol: 76 mg/dL     Total Cholesterol: 174 mg/dL     Plan for follow up: 3 months or sooner as needed  Buford Dresser, MD, PhD [place]  [facility]   Medication Adjustments/Labs and Tests Ordered: Current medicines are reviewed at length with the patient today.  Concerns regarding medicines are outlined above.  No orders of the defined types were placed in this encounter.  Meds ordered this encounter  Medications   amLODipine (NORVASC) 5 MG tablet    Sig: Take 1 tablet (5 mg total) by mouth at bedtime. For Blood Pressure    Dispense:  30 tablet    Refill:  11   furosemide (LASIX) 20 MG tablet    Sig: Take 1 tablet (20 mg total) by mouth 2 (two) times daily as needed for fluid.    Dispense:  60 tablet    Refill:  11   valsartan-hydrochlorothiazide (DIOVAN-HCT) 160-12.5 MG tablet    Sig: Take 1 tablet by mouth daily. For blood pressure    Dispense:  30 tablet    Refill:  11   rivaroxaban (XARELTO) 20 MG TABS tablet    Sig: Take 1 tablet (20 mg total) by mouth daily with supper.    Dispense:  30 tablet    Refill:  11     Patient Instructions  Medication Instructions:  Your physician recommends that you continue on your current medications as directed. Please refer to the Current Medication list given to you today.  *If you need a refill on your cardiac medications before your next appointment, please call your pharmacy*   Follow-Up: At [facility], you and your health needs are our priority.  As part of our continuing mission to provide you with exceptional heart care, we have created designated Provider Care Teams.  These Care Teams include your primary Cardiologist (physician) and  Advanced Practice Providers (APPs -  Physician Assistants and Nurse Practitioners) who all work together to provide you with the care you need, when you need it.  We recommend signing up for the patient portal called "MyChart".  Sign up information is provided on this After Visit Summary.  MyChart is used to connect with patients  for Virtual Visits (Telemedicine).  Patients are able to view lab/test results, encounter notes, upcoming appointments, etc.  Non-urgent messages can be sent to your provider as well.   To learn more about what you can do with MyChart, go to [email or web address].    Your next appointment:   3 month(s)  The format for your next appointment:   In Person  Provider:   Buford Dresser, MD Or APP    [facility] Stump[facility],acting as a scribe for Buford Dresser, MD.,have documented all relevant documentation on the behalf of Buford Dresser, MD,as directed by  Buford Dresser, MD while in the presence of Buford Dresser, MD.  I, Buford Dresser, MD, have reviewed all documentation for this visit. The documentation on 04/30/21 for the exam, diagnosis, procedures, and orders are all accurate and complete.  Signed, Buford Dresser, MD PhD 04/30/2021  [facility]

## 2021-05-02 ENCOUNTER — Encounter (HOSPITAL_BASED_OUTPATIENT_CLINIC_OR_DEPARTMENT_OTHER): Payer: Self-pay | Admitting: Cardiology

## 2021-05-08 DIAGNOSIS — R1030 Lower abdominal pain, unspecified: Secondary | ICD-10-CM | POA: Diagnosis not present

## 2021-05-08 DIAGNOSIS — N3941 Urge incontinence: Secondary | ICD-10-CM | POA: Diagnosis not present

## 2021-05-15 DIAGNOSIS — N3946 Mixed incontinence: Secondary | ICD-10-CM | POA: Diagnosis not present

## 2021-05-15 DIAGNOSIS — K5900 Constipation, unspecified: Secondary | ICD-10-CM | POA: Diagnosis not present

## 2021-05-15 DIAGNOSIS — M545 Low back pain, unspecified: Secondary | ICD-10-CM | POA: Diagnosis not present

## 2021-05-15 DIAGNOSIS — R300 Dysuria: Secondary | ICD-10-CM | POA: Diagnosis not present

## 2021-05-15 DIAGNOSIS — M6281 Muscle weakness (generalized): Secondary | ICD-10-CM | POA: Diagnosis not present

## 2021-05-17 DIAGNOSIS — K5900 Constipation, unspecified: Secondary | ICD-10-CM | POA: Diagnosis not present

## 2021-05-17 DIAGNOSIS — M6289 Other specified disorders of muscle: Secondary | ICD-10-CM | POA: Diagnosis not present

## 2021-05-17 DIAGNOSIS — N3941 Urge incontinence: Secondary | ICD-10-CM | POA: Diagnosis not present

## 2021-05-17 DIAGNOSIS — M62838 Other muscle spasm: Secondary | ICD-10-CM | POA: Diagnosis not present

## 2021-05-17 DIAGNOSIS — M6281 Muscle weakness (generalized): Secondary | ICD-10-CM | POA: Diagnosis not present

## 2021-05-17 DIAGNOSIS — R351 Nocturia: Secondary | ICD-10-CM | POA: Diagnosis not present

## 2021-05-21 ENCOUNTER — Ambulatory Visit: Payer: Medicare HMO | Admitting: Physician Assistant

## 2021-05-22 ENCOUNTER — Emergency Department (HOSPITAL_BASED_OUTPATIENT_CLINIC_OR_DEPARTMENT_OTHER)
Admission: EM | Admit: 2021-05-22 | Discharge: 2021-05-23 | Disposition: A | Discharge status: Home / Self Care | Payer: Medicare HMO | Attending: Emergency Medicine | Admitting: Emergency Medicine

## 2021-05-22 ENCOUNTER — Emergency Department (HOSPITAL_BASED_OUTPATIENT_CLINIC_OR_DEPARTMENT_OTHER): Payer: Medicare HMO

## 2021-05-22 ENCOUNTER — Encounter (HOSPITAL_BASED_OUTPATIENT_CLINIC_OR_DEPARTMENT_OTHER): Payer: Self-pay | Admitting: *Deleted

## 2021-05-22 ENCOUNTER — Other Ambulatory Visit: Payer: Self-pay

## 2021-05-22 DIAGNOSIS — N3000 Acute cystitis without hematuria: Secondary | ICD-10-CM | POA: Insufficient documentation

## 2021-05-22 DIAGNOSIS — Z79899 Other long term (current) drug therapy: Secondary | ICD-10-CM | POA: Insufficient documentation

## 2021-05-22 DIAGNOSIS — I10 Essential (primary) hypertension: Secondary | ICD-10-CM | POA: Insufficient documentation

## 2021-05-22 DIAGNOSIS — G8929 Other chronic pain: Secondary | ICD-10-CM | POA: Insufficient documentation

## 2021-05-22 DIAGNOSIS — R1013 Epigastric pain: Secondary | ICD-10-CM | POA: Diagnosis not present

## 2021-05-22 DIAGNOSIS — Z85850 Personal history of malignant neoplasm of thyroid: Secondary | ICD-10-CM | POA: Diagnosis not present

## 2021-05-22 DIAGNOSIS — K219 Gastro-esophageal reflux disease without esophagitis: Secondary | ICD-10-CM | POA: Diagnosis not present

## 2021-05-22 DIAGNOSIS — R1011 Right upper quadrant pain: Secondary | ICD-10-CM | POA: Diagnosis not present

## 2021-05-22 DIAGNOSIS — K5730 Diverticulosis of large intestine without perforation or abscess without bleeding: Secondary | ICD-10-CM | POA: Diagnosis not present

## 2021-05-22 DIAGNOSIS — E039 Hypothyroidism, unspecified: Secondary | ICD-10-CM | POA: Insufficient documentation

## 2021-05-22 LAB — URINALYSIS, ROUTINE W REFLEX MICROSCOPIC
Bilirubin Urine: NEGATIVE
Glucose, UA: NEGATIVE mg/dL
Hgb urine dipstick: NEGATIVE
Ketones, ur: NEGATIVE mg/dL
Nitrite: POSITIVE — AB
Specific Gravity, Urine: 1.024 (ref 1.005–1.030)
WBC, UA: >50 WBC/hpf — ABNORMAL HIGH (ref 0–5)
pH: 6 (ref 5.0–8.0)

## 2021-05-22 LAB — COMPREHENSIVE METABOLIC PANEL
ALT: 13 U/L (ref 0–44)
AST: 13 U/L — ABNORMAL LOW (ref 15–41)
Albumin: 4.2 g/dL (ref 3.5–5.0)
Alkaline Phosphatase: 88 U/L (ref 38–126)
Anion gap: 10 (ref 5–15)
BUN: 14 mg/dL (ref 8–23)
CO2: 26 mmol/L (ref 22–32)
Calcium: 9.6 mg/dL (ref 8.9–10.3)
Chloride: 103 mmol/L (ref 98–111)
Creatinine, Ser: 0.88 mg/dL (ref 0.44–1.00)
GFR, Estimated: >60 mL/min (ref >60)
Glucose, Bld: 90 mg/dL (ref 70–99)
Potassium: 3.4 mmol/L — ABNORMAL LOW (ref 3.5–5.1)
Sodium: 139 mmol/L (ref 135–145)
Total Bilirubin: 0.5 mg/dL (ref 0.3–1.2)
Total Protein: 7.3 g/dL (ref 6.5–8.1)

## 2021-05-22 LAB — CBC
HCT: 40.6 % (ref 36.0–46.0)
Hemoglobin: 13.1 g/dL (ref 12.0–15.0)
MCH: 29.3 pg (ref 26.0–34.0)
MCHC: 32.3 g/dL (ref 30.0–36.0)
MCV: 90.8 fL (ref 80.0–100.0)
Platelets: 261 10*3/uL (ref 150–400)
RBC: 4.47 MIL/uL (ref 3.87–5.11)
RDW: 14.4 % (ref 11.5–15.5)
WBC: 6.7 10*3/uL (ref 4.0–10.5)
nRBC: 0 % (ref 0.0–0.2)

## 2021-05-22 LAB — LIPASE, BLOOD: Lipase: 14 U/L (ref 11–51)

## 2021-05-22 MED ORDER — TRAMADOL HCL 50 MG PO TABS: 50.0000 mg | ORAL_TABLET | Freq: Four times a day (QID) | ORAL | 0 refills | Status: DC | PRN

## 2021-05-22 MED ORDER — SODIUM CHLORIDE 0.9 % IV SOLN
1.0000 g | Freq: Once | INTRAVENOUS | Status: AC
  Administered 2021-05-22: 1 g via INTRAVENOUS
  Filled 2021-05-22: qty 10

## 2021-05-22 MED ORDER — PANTOPRAZOLE SODIUM 40 MG PO TBEC: 40.0000 mg | DELAYED_RELEASE_TABLET | Freq: Two times a day (BID) | ORAL | 6 refills | Status: DC

## 2021-05-22 MED ORDER — KETOROLAC TROMETHAMINE 30 MG/ML IJ SOLN
30.0000 mg | Freq: Once | INTRAMUSCULAR | Status: AC
  Administered 2021-05-22: 30 mg via INTRAVENOUS
  Filled 2021-05-22: qty 1

## 2021-05-22 MED ORDER — SUCRALFATE 1 G PO TABS: 1.0000 g | ORAL_TABLET | Freq: Three times a day (TID) | ORAL | 0 refills | Status: DC

## 2021-05-22 MED ORDER — CEPHALEXIN 500 MG PO CAPS
500.0000 mg | ORAL_CAPSULE | Freq: Four times a day (QID) | ORAL | 0 refills | Status: DC
Start: 2021-05-22 — End: 2021-07-31

## 2021-05-22 NOTE — ED Triage Notes (Signed)
RUQ pain for a couple of weeks going to back and down. Urinary incontinence. Would like to get a dx.

## 2021-05-22 NOTE — ED Notes (Signed)
Patient transported to CT 

## 2021-05-22 NOTE — ED Provider Notes (Signed)
[facility] EMERGENCY DEPT Provider Note   CSN: [id number] Arrival date & time: 05/22/21  1302     History Chief Complaint  Patient presents with   Abdominal Pain    Natasha [name] is a 70 y.o. female.  Pt presents to the ED today with RUQ and epigastric abd pain.  Pt said she's had pain for about 3 weeks.  She did have a gb US in August that showed a nl gb. She was referred to GI and was supposed to have her appt yesterday, but the doctor had Covid, so the appt was cancelled.  She has some urinary incontinence, but that appears chronic per epic notes.      Past Medical History:  Diagnosis Date   Abdominal spasms 12/14/2020   Acute pulmonary embolism ([place]) 08/21/2019   Bacterial conjunctivitis of both eyes 12/14/2020   Bradycardia    Breast cancer screening by mammogram 11/24/2020   CARCINOMA, THYROID GLAND, PAPILLARY 05/04/2008   Stage 2, 8/09: thyroidectomy for 2.7cm papillary adenocarcinoma (t2 n0 mo) 9/09: I-131 rx, 108 mci 05/10: tg is neg (ab neg) , total body scan is neg   Colon cancer screening 11/24/2020   Colon polyps    Complication of anesthesia    trouble with airway   Cough 12/25/2007   Qualifier: Diagnosis of  By: Doy Mince LPN, Megan     [id number] 08/19/2019   Diverticulosis    DVT (deep venous thrombosis) ([place])    Edema 12/22/2008   Encounter to establish care 11/24/2020   History of 2019 novel coronavirus disease (COVID-19) 11/09/2019   HYPERTENSION 12/25/2007   HYPOTHYROIDISM, POSTSURGICAL 05/04/2008   LEG PAIN, LEFT 12/09/2008   Memory loss due to medical condition 11/09/2019   Muscle weakness    Numbness and tingling    OSA (obstructive sleep apnea) 06/15/2013   CPAP   Peripheral edema 12/22/2008   Qualifier: Diagnosis of  By: Johnsie Cancel, MD, Rona Ravens    Pneumonia due to COVID-19 virus    Pulmonary embolism ([place])    Right lower quadrant pain 11/24/2020   Skin sensation disturbance 12/05/2008   Qualifier: Diagnosis of  By: Asa Lente MD, Serita Grit of this note might be different from the original. Qualifier: Diagnosis of  By: Asa Lente MD, Mateo Flow A   Spasm of muscle of lower back 12/14/2020   Vaginal candidiasis 03/16/2021    Patient Active Problem List   Diagnosis Date Noted   Abdominal pain, RUQ 04/20/2021   Fatty liver 04/20/2021   Incontinence of urine in female 04/20/2021   Sinus drainage 04/20/2021   Wheezing 04/20/2021   Vitamin D deficiency 03/16/2021   Lumbago with sciatica, right side 01/08/2021   Other chronic pain 12/22/2020   Hip pain 12/22/2020   Constipation 11/24/2020   Gastroesophageal reflux disease 11/24/2020   Morbid obesity ([place]) 11/24/2020   Chronic midline low back pain with sciatica 11/24/2020   OAB (overactive bladder) 11/24/2020   Aortic atherosclerosis ([place]) 11/24/2020   Right-sided heart failure ([place]) 11/24/2020   History of pulmonary embolism 02/10/2020   Bilateral leg edema 02/10/2020   Paresthesia 01/04/2020   Gait abnormality 01/04/2020   Weakness 01/04/2020   Muscle weakness (generalized) 11/09/2019   Numbness and tingling of both feet 11/09/2019   Pulmonary nodule 11/09/2019   Obesity, Class III, BMI 40-49.9 (morbid obesity) ([place]) 08/24/2019   Pulmonary embolism ([place]) 08/23/2019   Bradycardia    OSA (obstructive sleep apnea) 06/15/2013   Sciatica of left side 12/04/2011  Allergic rhinitis 01/21/2009   Edema 12/22/2008   LEG PAIN, LEFT 12/09/2008   Nausea and vomiting 05/23/2008   CARCINOMA, THYROID GLAND, PAPILLARY 05/04/2008   Hypothyroidism, postsurgical 05/04/2008   Primary hypertension 12/25/2007   Dyspnea 12/25/2007    Past Surgical History:  Procedure Laterality Date   ABDOMINAL HYSTERECTOMY     ANKLE SURGERY Right    CERVICAL FUSION     x 2   COLONOSCOPY WITH PROPOFOL N/A 08/08/2015   Procedure: COLONOSCOPY WITH PROPOFOL;  Surgeon: Juanita Craver, MD;  Location: [facility];  Service: Endoscopy;  Laterality: N/A;   KNEE SURGERY Left    TOTAL  THYROIDECTOMY       OB History     Gravida  3   Para      Term      Preterm      AB      Living  3      SAB      IAB      Ectopic      Multiple      Live Births              Family History  Problem Relation Age of Onset   Heart disease Mother    Kidney disease Mother    Other Father        unsure of medical history    Social History   Tobacco Use   Smoking status: Never   Smokeless tobacco: Never  Vaping Use   Vaping Use: Never used  Substance Use Topics   Alcohol use: Never    Comment: 1 every 1-2 months   Drug use: No    Home Medications Prior to Admission medications   Medication Sig Start Date End Date Taking? Authorizing Provider  cephALEXin (KEFLEX) 500 MG capsule Take 1 capsule (500 mg total) by mouth 4 (four) times daily. 05/22/21  Yes Isla Pence, MD  sucralfate (CARAFATE) 1 g tablet Take 1 tablet (1 g total) by mouth 4 (four) times daily -  with meals and at bedtime. 05/22/21  Yes Isla Pence, MD  traMADol (ULTRAM) 50 MG tablet Take 1 tablet (50 mg total) by mouth every 6 (six) hours as needed. 05/22/21  Yes Isla Pence, MD  amLODipine (NORVASC) 5 MG tablet Take 1 tablet (5 mg total) by mouth at bedtime. For Blood Pressure 04/30/21   Buford Dresser, MD  cyclobenzaprine (FLEXERIL) 5 MG tablet Take by mouth.    [provider]  famotidine (PEPCID) 20 MG tablet  11/27/20   [provider]  furosemide (LASIX) 20 MG tablet Take 1 tablet (20 mg total) by mouth 2 (two) times daily as needed for fluid. 04/30/21   Buford Dresser, MD  Javier Docker Oil 1000 MG CAPS Take 1,000 mg by mouth 2 (two) times daily.    [provider]  montelukast (SINGULAIR) 10 MG tablet Take 1 tablet (10 mg total) by mouth at bedtime. 04/20/21   Orma Render, NP  Multiple Vitamin (MULTIVITAMIN WITH MINERALS) TABS tablet Take 1 tablet by mouth daily.    [provider]  MYRBETRIQ 25 MG TB24 tablet Take 1 tablet (25 mg  total) by mouth daily. 04/20/21   Orma Render, NP  pantoprazole (PROTONIX) 40 MG tablet Take 1 tablet (40 mg total) by mouth 2 (two) times daily. 05/22/21   Isla Pence, MD  rivaroxaban (XARELTO) 20 MG TABS tablet Take 1 tablet (20 mg total) by mouth daily with supper. 04/30/21 04/25/22  Buford Dresser,  MD  SYNTHROID 112 MCG tablet Take 1 tablet (112 mcg total) by mouth every morning. 03/16/21   Orma Render, NP  tetrahydrozoline-zinc (VISINE-AC) 0.05-0.25 % ophthalmic solution     [provider]  valsartan-hydrochlorothiazide (DIOVAN-HCT) 160-12.5 MG tablet Take 1 tablet by mouth daily. For blood pressure 04/30/21   Buford Dresser, MD    Allergies    Codeine and Eggs or egg-derived products  Review of Systems   Review of Systems  Gastrointestinal:  Positive for abdominal pain.  All other systems reviewed and are negative.  Physical Exam Updated Vital Signs BP (!) 148/98 (BP Location: Left Arm)   Pulse (!) 54   Temp 97.8 F (36.6 C) (Oral)   Resp 18   Ht 5\' 7"  (1.702 m)   Wt 136.1 kg   SpO2 100%   BMI 46.99 kg/m   Physical Exam Vitals and nursing note reviewed.  Constitutional:      Appearance: She is well-developed. She is obese.  HENT:     Head: Normocephalic and atraumatic.     Mouth/Throat:     Mouth: Mucous membranes are moist.     Pharynx: Oropharynx is clear.  Eyes:     Extraocular Movements: Extraocular movements intact.     Pupils: Pupils are equal, round, and reactive to light.  Cardiovascular:     Rate and Rhythm: Normal rate and regular rhythm.  Pulmonary:     Effort: Pulmonary effort is normal.     Breath sounds: Normal breath sounds.  Abdominal:     General: Abdomen is flat. Bowel sounds are normal.     Palpations: Abdomen is soft.     Tenderness: There is abdominal tenderness in the right upper quadrant and epigastric area.  Skin:    General: Skin is warm.     Capillary Refill: Capillary refill takes less than 2 seconds.   Neurological:     General: No focal deficit present.     Mental Status: She is alert and oriented to person, place, and time.  Psychiatric:        Mood and Affect: Mood normal.        Behavior: Behavior normal.    ED Results / Procedures / Treatments   Labs (all labs ordered are listed, but only abnormal results are displayed) Labs Reviewed  COMPREHENSIVE METABOLIC PANEL - Abnormal; Notable for the following components:      Result Value   Potassium 3.4 (*)    AST 13 (*)    All other components within normal limits  URINALYSIS, ROUTINE W REFLEX MICROSCOPIC - Abnormal; Notable for the following components:   APPearance HAZY (*)    Protein, ur TRACE (*)    Nitrite POSITIVE (*)    Leukocytes,Ua LARGE (*)    WBC, UA >50 (*)    Bacteria, UA MANY (*)    All other components within normal limits  LIPASE, BLOOD  CBC    EKG None  Radiology CT Renal Stone Study  Result Date: 05/22/2021 CLINICAL DATA:  Right upper quadrant pain, kidney stone suspected EXAM: CT ABDOMEN AND PELVIS WITHOUT CONTRAST TECHNIQUE: Multidetector CT imaging of the abdomen and pelvis was performed following the standard protocol without IV contrast. COMPARISON:  11/10/2020. FINDINGS: Lower chest: No acute abnormality. Hepatobiliary: No focal liver abnormality is seen. No gallstones, gallbladder wall thickening, or biliary dilatation. Pancreas: Unremarkable. No pancreatic ductal dilatation or surrounding inflammatory changes. Spleen: Normal in size without focal abnormality. Adrenals/Urinary Tract: The kidneys are symmetric in size. No  hydronephrosis or nephrolithiasis. No stone is seen in the course of the ureters or within the bladder, which is unremarkable. Stomach/Bowel: Stomach is within normal limits. Appendix appears normal. No evidence of bowel wall thickening, distention, or inflammatory changes. Diverticulosis without evidence of diverticulitis. Vascular/Lymphatic: Aortic atherosclerosis. No enlarged  abdominal or pelvic lymph nodes. Reproductive: Status post hysterectomy. No adnexal masses. Other: Diastasis recti. No abdominal wall abnormality. No free fluid or free air in the abdomen pelvis. Musculoskeletal: Multilevel degenerative changes in the lumbar spine and imaged thoracic spine no acute osseous abnormality. IMPRESSION: No acute process in the abdomen. Aortic Atherosclerosis (ICD10-I70.0). Electronically Signed   By: Merilyn Baba M.D.   On: 05/22/2021 19:21    Procedures Procedures   Medications Ordered in ED Medications  cefTRIAXone (ROCEPHIN) 1 g in sodium chloride 0.9 % 100 mL IVPB (0 g Intravenous Stopped 05/22/21 1927)  ketorolac (TORADOL) 30 MG/ML injection 30 mg (30 mg Intravenous Given 05/22/21 1823)    ED Course  I have reviewed the triage vital signs and the nursing notes.  Pertinent labs & imaging results that were available during my care of the patient were reviewed by me and considered in my medical decision making (see chart for details).    MDM Rules/Calculators/A&P                           Pt's work up unremarkable other than UTI.  She is given rocephin and toradol in the ED.  She wants to see GI now.  I told her we'd give her some meds to help her until she can see GI.  Pt is to return if worse. F/u with pcp and with GI. Final Clinical Impression(s) / ED Diagnoses Final diagnoses:  Gastroesophageal reflux disease, unspecified whether esophagitis present  Epigastric pain  Acute cystitis without hematuria  Other chronic pain    Rx / DC Orders ED Discharge Orders          Ordered    pantoprazole (PROTONIX) 40 MG tablet  2 times daily        05/22/21 1928    sucralfate (CARAFATE) 1 g tablet  3 times daily with meals & bedtime        05/22/21 1928    cephALEXin (KEFLEX) 500 MG capsule  4 times daily        05/22/21 1930    traMADol (ULTRAM) 50 MG tablet  Every 6 hours PRN        05/22/21 1933             Isla Pence, MD 05/22/21 1935

## 2021-05-25 ENCOUNTER — Other Ambulatory Visit: Payer: Self-pay | Admitting: Physician Assistant

## 2021-05-25 DIAGNOSIS — R140 Abdominal distension (gaseous): Secondary | ICD-10-CM | POA: Diagnosis not present

## 2021-05-25 DIAGNOSIS — R1084 Generalized abdominal pain: Secondary | ICD-10-CM | POA: Diagnosis not present

## 2021-05-25 DIAGNOSIS — Z1211 Encounter for screening for malignant neoplasm of colon: Secondary | ICD-10-CM | POA: Diagnosis not present

## 2021-05-25 DIAGNOSIS — R198 Other specified symptoms and signs involving the digestive system and abdomen: Secondary | ICD-10-CM | POA: Diagnosis not present

## 2021-06-14 ENCOUNTER — Ambulatory Visit: Payer: Medicare HMO | Admitting: Gastroenterology

## 2021-06-19 ENCOUNTER — Other Ambulatory Visit: Payer: Self-pay | Admitting: Physician Assistant

## 2021-06-21 DIAGNOSIS — Z1211 Encounter for screening for malignant neoplasm of colon: Secondary | ICD-10-CM | POA: Diagnosis not present

## 2021-06-21 DIAGNOSIS — R1084 Generalized abdominal pain: Secondary | ICD-10-CM | POA: Diagnosis not present

## 2021-06-21 DIAGNOSIS — K2900 Acute gastritis without bleeding: Secondary | ICD-10-CM | POA: Diagnosis not present

## 2021-06-21 DIAGNOSIS — K635 Polyp of colon: Secondary | ICD-10-CM | POA: Diagnosis not present

## 2021-06-26 DIAGNOSIS — K635 Polyp of colon: Secondary | ICD-10-CM | POA: Diagnosis not present

## 2021-07-05 DIAGNOSIS — K200 Eosinophilic esophagitis: Secondary | ICD-10-CM | POA: Diagnosis not present

## 2021-07-11 DIAGNOSIS — N3941 Urge incontinence: Secondary | ICD-10-CM | POA: Diagnosis not present

## 2021-07-11 DIAGNOSIS — M6281 Muscle weakness (generalized): Secondary | ICD-10-CM | POA: Diagnosis not present

## 2021-07-11 DIAGNOSIS — M6289 Other specified disorders of muscle: Secondary | ICD-10-CM | POA: Diagnosis not present

## 2021-07-11 DIAGNOSIS — M62838 Other muscle spasm: Secondary | ICD-10-CM | POA: Diagnosis not present

## 2021-07-11 DIAGNOSIS — K5900 Constipation, unspecified: Secondary | ICD-10-CM | POA: Diagnosis not present

## 2021-07-11 DIAGNOSIS — R351 Nocturia: Secondary | ICD-10-CM | POA: Diagnosis not present

## 2021-07-31 ENCOUNTER — Other Ambulatory Visit: Payer: Self-pay

## 2021-07-31 ENCOUNTER — Encounter (HOSPITAL_BASED_OUTPATIENT_CLINIC_OR_DEPARTMENT_OTHER): Payer: Self-pay | Admitting: Nurse Practitioner

## 2021-07-31 ENCOUNTER — Ambulatory Visit (INDEPENDENT_AMBULATORY_CARE_PROVIDER_SITE_OTHER): Payer: Medicare HMO | Admitting: Nurse Practitioner

## 2021-07-31 DIAGNOSIS — G8929 Other chronic pain: Secondary | ICD-10-CM

## 2021-07-31 DIAGNOSIS — M6281 Muscle weakness (generalized): Secondary | ICD-10-CM

## 2021-07-31 DIAGNOSIS — M25559 Pain in unspecified hip: Secondary | ICD-10-CM | POA: Diagnosis not present

## 2021-07-31 DIAGNOSIS — R269 Unspecified abnormalities of gait and mobility: Secondary | ICD-10-CM

## 2021-07-31 DIAGNOSIS — R062 Wheezing: Secondary | ICD-10-CM | POA: Diagnosis not present

## 2021-07-31 DIAGNOSIS — R531 Weakness: Secondary | ICD-10-CM | POA: Diagnosis not present

## 2021-07-31 DIAGNOSIS — M5442 Lumbago with sciatica, left side: Secondary | ICD-10-CM | POA: Diagnosis not present

## 2021-07-31 DIAGNOSIS — M5441 Lumbago with sciatica, right side: Secondary | ICD-10-CM

## 2021-07-31 DIAGNOSIS — N3281 Overactive bladder: Secondary | ICD-10-CM

## 2021-07-31 MED ORDER — CELECOXIB 100 MG PO CAPS: 100.0000 mg | ORAL_CAPSULE | Freq: Two times a day (BID) | ORAL | 3 refills | Status: DC

## 2021-07-31 MED ORDER — MIRABEGRON ER 25 MG PO TB24: 25.0000 mg | ORAL_TABLET | Freq: Every day | ORAL | 0 refills | Status: DC

## 2021-07-31 NOTE — Assessment & Plan Note (Signed)
Chronic low back pain on the midline with sciatica symptoms present.  Patient reports pain has worsened since having COVID earlier this year. Unable to take Tylenol and typical ibuprofen for her symptoms due to interactions and complications with other medications. Discussed with the patient the option of a trial of Celebrex to see if this helps with with some of her pain management.  She has tried this before and is agreeable to try it again. Discussed with patient if this medication is not affordable or does not work we can consider an alternative agent such as duloxetine to see if this gives her improved management of her chronic pain. We will also send a referral for hyperbaric oxygen therapy to see if this is helpful in the treatment of her symptoms.

## 2021-07-31 NOTE — Assessment & Plan Note (Signed)
See chronic midline back pain for full A&P

## 2021-07-31 NOTE — Assessment & Plan Note (Signed)
Chronic generalized musculoskeletal pain associated with fibromyalgia, degenerative disc disease, and physical deconditioning. Symptoms have worsened since the patient had COVID earlier this year and she has not been able to recover well. Will send referral today for hyperbaric oxygen therapy to see if this is helpful in improving her symptoms. Will also start on Celebrex today in an effort to avoid overuse of Tylenol and ibuprofen. Will consider duloxetine if this medication is not effective or affordable.

## 2021-07-31 NOTE — Assessment & Plan Note (Addendum)
>>  ASSESSMENT AND PLAN FOR MOBILITY IMPAIRED WRITTEN ON 03/04/2024  5:39 PM BY Kelbi Renstrom E, NP   >>ASSESSMENT AND PLAN FOR GAIT INSTABILITY WRITTEN ON 03/04/2024  5:39 PM BY Avayah Raffety E, NP   >>ASSESSMENT AND PLAN FOR GAIT ABNORMALITY WRITTEN ON 07/31/2021  5:22 PM BY Tracer Gutridge E, NP  Gait abnormality and weakness related to musculoskeletal pain of chronic nature. We will send referral today for hyperbaric oxygen  therapy to see if patient qualifies. Recommend continuing exercises at home and working on building up muscle strength.   >>ASSESSMENT AND PLAN FOR PHYSICAL DECONDITIONING WRITTEN ON 07/26/2024  3:56 PM BY Alexza Norbeck E, NP   >>ASSESSMENT AND PLAN FOR MUSCLE WEAKNESS (GENERALIZED) WRITTEN ON 07/31/2021  5:21 PM BY Julieana Eshleman E, NP  Chronic generalized muscle weakness worsened recently with COVID-19 infection. She is significantly limited in her ambulation and movement and is now dependent on her walker for ambulation. Patient is interested in hyperbaric oxygen  therapy for treatment of her chronic pain and post COVID symptoms. We will send referral today to see if this is a helpful option.

## 2021-07-31 NOTE — Assessment & Plan Note (Signed)
>>  ASSESSMENT AND PLAN FOR MUSCLE WEAKNESS (GENERALIZED) WRITTEN ON 07/31/2021  5:21 PM BY Fleet Higham E, NP  Chronic generalized muscle weakness worsened recently with COVID-19 infection. She is significantly limited in her ambulation and movement and is now dependent on her walker for ambulation. Patient is interested in hyperbaric oxygen  therapy for treatment of her chronic pain and post COVID symptoms. We will send referral today to see if this is a helpful option.

## 2021-07-31 NOTE — Assessment & Plan Note (Signed)
Chronic wheezing worsened with COVID-19 earlier this year. She is interested in hyperbaric oxygen therapy to see if this would be helpful in her symptom improvement and breathing. We will send referral today.  No alarm symptoms present.

## 2021-07-31 NOTE — Assessment & Plan Note (Signed)
>>  ASSESSMENT AND PLAN FOR GAIT ABNORMALITY WRITTEN ON 07/31/2021  5:22 PM BY Davidmichael Zarazua E, NP  Gait abnormality and weakness related to musculoskeletal pain of chronic nature. We will send referral today for hyperbaric oxygen  therapy to see if patient qualifies. Recommend continuing exercises at home and working on building up muscle strength.

## 2021-07-31 NOTE — Assessment & Plan Note (Signed)
Chronic generalized muscle weakness worsened recently with COVID-19 infection. She is significantly limited in her ambulation and movement and is now dependent on her walker for ambulation. Patient is interested in hyperbaric oxygen therapy for treatment of her chronic pain and post COVID symptoms. We will send referral today to see if this is a helpful option.

## 2021-07-31 NOTE — Patient Instructions (Signed)
I have sent the referral to [facility] for Hyperbaric oxygen therapy.  I have 28 tabs of Myrbetriq at the front desk on the 3rd floor. This is available for you to pick up at your convenience.   I have sent the prescription for celebrex to the pharmacy for you. If this is too expensive, we can try duloxetine to see how that works for you.

## 2021-07-31 NOTE — Assessment & Plan Note (Signed)
Gait abnormality and weakness related to musculoskeletal pain of chronic nature. We will send referral today for hyperbaric oxygen therapy to see if patient qualifies. Recommend continuing exercises at home and working on building up muscle strength.

## 2021-07-31 NOTE — Assessment & Plan Note (Signed)
Longstanding history of overactive bladder with some symptom relief with previous use of Myrbetriq and Gemtesa.  Medications are not affordable for patient at this time therefore she has not been on anything. She is not having much symptom relief with pelvic floor exercises and these limitations are causing a significant impact on her quality of life. Will restart Myrbetriq today. Patient has been provided with 1 month supply of samples to see if this medication is still effective and then will consider prescription option if coverage is present.

## 2021-07-31 NOTE — Progress Notes (Signed)
Virtual Visit Encounter telephone visit.   I connected with  Natasha [name] on 07/31/21 at  3:50 PM EST by secure audio and/or video enabled telemedicine application. I verified that I am speaking with the correct person using two identifiers.   I introduced myself as a Designer, jewellery with the practice. The limitations of evaluation and management by telemedicine discussed with the patient and the availability of in person appointments. The patient expressed verbal understanding and consent to proceed.  Participating parties in this visit include: Myself and patient  The patient is: Patient Location: Home I am: Provider Location: Office/Clinic Subjective:    CC and HPI: Natasha [name] is a 70 y.o. year old female presenting for follow up of chronic pain and OAB. Natasha [name] was significantly ill with COVID earlier this year and she reports that she has had difficulty recovering. She reports that she continues to have symptoms of increased lethargy and weakness.  She is virtually completely dependent on her walker at this time.  She also tells me that she has no energy.  She endorses wheezing on and off most notably when she goes to bed at night.  She also has chronic congestion in her ears nose and throat.  She reports that her joint and body pain from fibromyalgia also feel heightened since her infection.  She tells me her body "cannot let it go". She reports that a friend of hers that had COVID at the same time started hyperbaric oxygen therapy this past summer and has had significant improvement in her symptoms since starting.  She is interested in this treatment option and would like a referral today.  She tells me that for her joint pain she has been taking Tylenol however she was recently told by the GI doctor that she needs to avoid Tylenol due to fatty liver.  She reports that she was taking so much Tylenol at 1 point that she began to have liver pain. She reports that without the Tylenol she  is unable to get any relief.  She endorses continued concerns over her overactive bladder.  She has previously been on [place] however both of these medications were extremely expensive and she was unable to afford to continue them.  She has been working with bladder training exercises however has not had much luck in reducing her symptoms.  She tells me that her overactive bladder symptoms are quite debilitating and limiting for her.  With her difficulty ambulating lately she feels the effect on her life and daily activities has worsened.  Past medical history, Surgical history, Family history not pertinant except as noted below, Social history, Allergies, and medications have been entered into the medical record, reviewed, and corrections made.   Review of Systems:  All review of systems negative except what is listed in the HPI  Objective:    Alert and oriented x 4 Speaking in clear sentences with no shortness of breath. No distress.  Impression and Recommendations:    Problem List Items Addressed This Visit     Muscle weakness (generalized)    Chronic generalized muscle weakness worsened recently with COVID-19 infection. She is significantly limited in her ambulation and movement and is now dependent on her walker for ambulation. Patient is interested in hyperbaric oxygen therapy for treatment of her chronic pain and post COVID symptoms. We will send referral today to see if this is a helpful option.      Relevant Orders   Ambulatory referral to Integrative  Medicine   Gait abnormality    Gait abnormality and weakness related to musculoskeletal pain of chronic nature. We will send referral today for hyperbaric oxygen therapy to see if patient qualifies. Recommend continuing exercises at home and working on building up muscle strength.      Relevant Orders   Ambulatory referral to Integr[facility] Medicine   RESOLVED: Weakness   Relevant Orders   Ambulatory referral  to Integrative Medicine   Chronic midline low back pain with sciatica    Chronic low back pain on the midline with sciatica symptoms present.  Patient reports pain has worsened since having COVID earlier this year. Unable to take Tylenol and typical ibuprofen for her symptoms due to interactions and complications with other medications. Discussed with the patient the option of a trial of Celebrex to see if this helps with with some of her pain management.  She has tried this before and is agreeable to try it again. Discussed with patient if this medication is not affordable or does not work we can consider an alternative agent such as duloxetine to see if this gives her improved management of her chronic pain. We will also send a referral for hyperbaric oxygen therapy to see if this is helpful in the treatment of her symptoms.       Relevant Medications   celecoxib (CELEBREX) 100 MG capsule   OAB (overactive bladder)    Longstanding history of overactive bladder with some symptom relief with previous use of Myrbetriq and Gemtesa.  Medications are not affordable for patient at this time therefore she has not been on anything. She is not having much symptom relief with pelvic floor exercises and these limitations are causing a significant impact on her quality of life. Will restart Myrbetriq today. Patient has been provided with 1 month supply of samples to see if this medication is still effective and then will consider prescription option if coverage is present.      Relevant Medications   mirabegron ER (MYRBETRIQ) 25 MG TB24 tablet   Other Relevant Orders   Ambulatory referral to Integrative Medicine   Other chronic pain - Primary    Chronic generalized musculoskeletal pain associated with fibromyalgia, degenerative disc disease, and physical deconditioning. Symptoms have worsened since the patient had COVID earlier this year and she has not been able to recover well. Will send referral  today for hyperbaric oxygen therapy to see if this is helpful in improving her symptoms. Will also start on Celebrex today in an effort to avoid overuse of Tylenol and ibuprofen. Will consider duloxetine if this medication is not effective or affordable.      Relevant Medications   celecoxib (CELEBREX) 100 MG capsule   Other Relevant Orders   Ambulatory referral to Integrative Medicine   Hip pain    See chronic midline back pain for full A&P      Relevant Orders   Ambulatory referral to Integrative Medicine   Wheezing    Chronic wheezing worsened with COVID-19 earlier this year. She is interested in hyperbaric oxygen therapy to see if this would be helpful in her symptom improvement and breathing. We will send referral today.  No alarm symptoms present.      Relevant Orders   Ambulatory referral to Integrative Medicine   RESOLVED: Lumbago with sciatica, right side   Relevant Medications   celecoxib (CELEBREX) 100 MG capsule   Other Relevant Orders   Ambulatory referral to Integrative Medicine    Myrbetriq 25mg  : Take  1 tablet by mouth daily for overactive bladder.  Samples of this drug were given to the patient, quantity 28 tabs, Lot Number [id number]   orders and follow up as documented in EMR I discussed the assessment and treatment plan with the patient. The patient was provided an opportunity to ask questions and all were answered. The patient agreed with the plan and demonstrated an understanding of the instructions.   The patient was advised to call back or seek an in-person evaluation if the symptoms worsen or if the condition fails to improve as anticipated.  Follow-Up: in 3 months  I provided 28 minutes of non-face-to-face interaction with this non face-to-face encounter including intake, same-day documentation, and chart review.   Orma Render, NP , DNP, AGNP-c [place] at [facility] [phone] (fax)

## 2021-07-31 NOTE — Assessment & Plan Note (Signed)
>>  ASSESSMENT AND PLAN FOR OAB (OVERACTIVE BLADDER) WRITTEN ON 07/31/2021  5:20 PM BY Diantha Paxson E, NP  Longstanding history of overactive bladder with some symptom relief with previous use of Myrbetriq  and Gemtesa .  Medications are not affordable for patient at this time therefore she has not been on anything. She is not having much symptom relief with pelvic floor exercises and these limitations are causing a significant impact on her quality of life. Will restart Myrbetriq  today. Patient has been provided with 1 month supply of samples to see if this medication is still effective and then will consider prescription option if coverage is present.

## 2021-08-06 ENCOUNTER — Ambulatory Visit (HOSPITAL_BASED_OUTPATIENT_CLINIC_OR_DEPARTMENT_OTHER): Payer: Medicare HMO | Admitting: Cardiology

## 2021-08-09 DIAGNOSIS — M4322 Fusion of spine, cervical region: Secondary | ICD-10-CM | POA: Diagnosis not present

## 2021-08-09 DIAGNOSIS — M545 Low back pain, unspecified: Secondary | ICD-10-CM | POA: Diagnosis not present

## 2021-08-09 DIAGNOSIS — M48061 Spinal stenosis, lumbar region without neurogenic claudication: Secondary | ICD-10-CM | POA: Diagnosis not present

## 2021-08-09 DIAGNOSIS — M542 Cervicalgia: Secondary | ICD-10-CM | POA: Diagnosis not present

## 2021-08-09 DIAGNOSIS — M5416 Radiculopathy, lumbar region: Secondary | ICD-10-CM | POA: Diagnosis not present

## 2021-08-09 DIAGNOSIS — Z6841 Body Mass Index (BMI) 40.0 and over, adult: Secondary | ICD-10-CM | POA: Diagnosis not present

## 2021-09-05 DIAGNOSIS — M47817 Spondylosis without myelopathy or radiculopathy, lumbosacral region: Secondary | ICD-10-CM | POA: Diagnosis not present

## 2021-09-05 DIAGNOSIS — M48061 Spinal stenosis, lumbar region without neurogenic claudication: Secondary | ICD-10-CM | POA: Diagnosis not present

## 2021-09-05 DIAGNOSIS — M47816 Spondylosis without myelopathy or radiculopathy, lumbar region: Secondary | ICD-10-CM | POA: Diagnosis not present

## 2021-09-05 DIAGNOSIS — M47815 Spondylosis without myelopathy or radiculopathy, thoracolumbar region: Secondary | ICD-10-CM | POA: Diagnosis not present

## 2021-09-05 DIAGNOSIS — M419 Scoliosis, unspecified: Secondary | ICD-10-CM | POA: Diagnosis not present

## 2021-09-05 DIAGNOSIS — M2578 Osteophyte, vertebrae: Secondary | ICD-10-CM | POA: Diagnosis not present

## 2021-09-05 DIAGNOSIS — M5126 Other intervertebral disc displacement, lumbar region: Secondary | ICD-10-CM | POA: Diagnosis not present

## 2021-09-06 ENCOUNTER — Telehealth (HOSPITAL_BASED_OUTPATIENT_CLINIC_OR_DEPARTMENT_OTHER): Payer: Self-pay

## 2021-09-06 DIAGNOSIS — E032 Hypothyroidism due to medicaments and other exogenous substances: Secondary | ICD-10-CM | POA: Diagnosis not present

## 2021-09-06 DIAGNOSIS — C73 Malignant neoplasm of thyroid gland: Secondary | ICD-10-CM | POA: Diagnosis not present

## 2021-09-06 NOTE — Telephone Encounter (Signed)
Patient called in to report that her daughter tested positive for covid 3 days ago She has been around her daughter and is symptomatic. She is having chills, headaches, and coughing for the last 2 days She did an at home covid test and it was negative but the patient states she has had covid before and she was hospitalized for 6 weeks Patient is requesting to have paxlovid sent in to the pharmacy to help curtail symptoms and prevent her from being in the hospital again. Will route to Dr. de [place] for advisement as Laretta Bolster is out of the office.

## 2021-09-07 NOTE — Telephone Encounter (Signed)
Called patient to discuss recommendations of Dr. de [place] Patient voiced understanding and is agreeable to plan

## 2021-10-29 ENCOUNTER — Other Ambulatory Visit: Payer: Self-pay

## 2021-10-29 ENCOUNTER — Ambulatory Visit (INDEPENDENT_AMBULATORY_CARE_PROVIDER_SITE_OTHER): Payer: BC Managed Care – PPO | Admitting: Nurse Practitioner

## 2021-10-29 DIAGNOSIS — I10 Essential (primary) hypertension: Secondary | ICD-10-CM | POA: Diagnosis not present

## 2021-10-29 DIAGNOSIS — I509 Heart failure, unspecified: Secondary | ICD-10-CM | POA: Diagnosis not present

## 2021-10-29 DIAGNOSIS — N3000 Acute cystitis without hematuria: Secondary | ICD-10-CM | POA: Diagnosis not present

## 2021-10-29 DIAGNOSIS — Z20822 Contact with and (suspected) exposure to covid-19: Secondary | ICD-10-CM | POA: Diagnosis not present

## 2021-10-29 NOTE — Progress Notes (Signed)
Virtual Visit Encounter  {telephone/mychart:25033} visit.   I connected with  Natasha [name] on 10/29/21 at  3:50 PM EST by secure audio and/or video enabled telemedicine application. I verified that I am speaking with the correct person using two identifiers.   I introduced myself as a Designer, jewellery with the practice. The limitations of evaluation and management by telemedicine discussed with the patient and the availability of in person appointments. The patient expressed verbal understanding and consent to proceed.  Participating parties in this visit include: Myself and {Relatives of adult:5061}  The patient is: {Patient [phone]::"Home"} I am: {Provider Location:[phone]::"Home Office"} Subjective:    CC and HPI: Natasha [name] is a 71 y.o. year old female presenting for {New/followup:15353} ***.  Past medical history, Surgical history, Family history not pertinant except as noted below, Social history, Allergies, and medications have been entered into the medical record, reviewed, and corrections made.   Review of Systems:  All review of systems negative except what is listed in the HPI  Objective:    Alert and oriented x *** Speaking in clear sentences with no shortness of breath. No distress.  Impression and Recommendations:    Problem List Items Addressed This Visit   None   {disease follow up plans:315751} I discussed the assessment and treatment plan with the patient. The patient was provided an opportunity to ask questions and all were answered. The patient agreed with the plan and demonstrated an understanding of the instructions.   The patient was advised to call back or seek an in-person evaluation if the symptoms worsen or if the condition fails to improve as anticipated.  Follow-Up: {plan; follow-up:11812}  I provided *** minutes of non-face-to-face interaction with this non face-to-face encounter including intake, same-day documentation,  and chart review.   Orma Render, NP , DNP, AGNP-c [place] at [facility] [phone] (fax)

## 2021-11-09 ENCOUNTER — Telehealth: Payer: Self-pay | Admitting: Cardiology

## 2021-11-09 NOTE — Telephone Encounter (Signed)
Please advise if it is okay to clear patient to exercise  ?

## 2021-11-09 NOTE — Telephone Encounter (Signed)
? ?  Pt is at [facility] right now and become ill, she was advised she needed clearance from Dr. Harrell Gave that she can resume exercise, at least walk on a treadmill.  ?

## 2021-11-19 ENCOUNTER — Encounter (HOSPITAL_BASED_OUTPATIENT_CLINIC_OR_DEPARTMENT_OTHER): Payer: Self-pay

## 2021-11-19 NOTE — Telephone Encounter (Signed)
Attempted call no answer, unable to Leave message. Reached out via my chart message!  ?

## 2021-11-19 NOTE — Telephone Encounter (Signed)
Is she feeling back to baseline? If so, ok to resume exercise.

## 2021-11-21 NOTE — Telephone Encounter (Signed)
Left message to call back  

## 2021-11-29 DIAGNOSIS — R3982 Chronic bladder pain: Secondary | ICD-10-CM | POA: Diagnosis not present

## 2021-11-29 DIAGNOSIS — M545 Low back pain, unspecified: Secondary | ICD-10-CM | POA: Diagnosis not present

## 2021-11-29 DIAGNOSIS — R300 Dysuria: Secondary | ICD-10-CM | POA: Diagnosis not present

## 2021-11-29 NOTE — Telephone Encounter (Signed)
Attempted to contact pt x 3. Left message to call back 

## 2021-11-30 ENCOUNTER — Ambulatory Visit (INDEPENDENT_AMBULATORY_CARE_PROVIDER_SITE_OTHER): Payer: Medicare PPO | Admitting: Family

## 2021-11-30 ENCOUNTER — Ambulatory Visit (INDEPENDENT_AMBULATORY_CARE_PROVIDER_SITE_OTHER): Payer: Medicare PPO | Admitting: Nurse Practitioner

## 2021-11-30 ENCOUNTER — Encounter (HOSPITAL_BASED_OUTPATIENT_CLINIC_OR_DEPARTMENT_OTHER): Payer: Self-pay | Admitting: Family

## 2021-11-30 VITALS — BP 138/76 | HR 62 | Ht 67.0 in | Wt 319.0 lb

## 2021-11-30 VITALS — BP 138/76 | HR 62 | Temp 98.7°F | Resp 99 | Ht 67.0 in | Wt 319.0 lb

## 2021-11-30 DIAGNOSIS — I50810 Right heart failure, unspecified: Secondary | ICD-10-CM | POA: Diagnosis not present

## 2021-11-30 DIAGNOSIS — R0609 Other forms of dyspnea: Secondary | ICD-10-CM

## 2021-11-30 DIAGNOSIS — R1032 Left lower quadrant pain: Secondary | ICD-10-CM

## 2021-11-30 DIAGNOSIS — D6859 Other primary thrombophilia: Secondary | ICD-10-CM | POA: Diagnosis not present

## 2021-11-30 DIAGNOSIS — E559 Vitamin D deficiency, unspecified: Secondary | ICD-10-CM | POA: Diagnosis not present

## 2021-11-30 DIAGNOSIS — E890 Postprocedural hypothyroidism: Secondary | ICD-10-CM | POA: Diagnosis not present

## 2021-11-30 DIAGNOSIS — I10 Essential (primary) hypertension: Secondary | ICD-10-CM

## 2021-11-30 DIAGNOSIS — Z85850 Personal history of malignant neoplasm of thyroid: Secondary | ICD-10-CM | POA: Diagnosis not present

## 2021-11-30 DIAGNOSIS — I700 Atherosclerosis of aorta: Secondary | ICD-10-CM

## 2021-11-30 DIAGNOSIS — G8929 Other chronic pain: Secondary | ICD-10-CM | POA: Diagnosis not present

## 2021-11-30 DIAGNOSIS — Z8616 Personal history of COVID-19: Secondary | ICD-10-CM | POA: Diagnosis not present

## 2021-11-30 DIAGNOSIS — G4733 Obstructive sleep apnea (adult) (pediatric): Secondary | ICD-10-CM | POA: Diagnosis not present

## 2021-11-30 DIAGNOSIS — R1031 Right lower quadrant pain: Secondary | ICD-10-CM | POA: Diagnosis not present

## 2021-11-30 DIAGNOSIS — Z86711 Personal history of pulmonary embolism: Secondary | ICD-10-CM

## 2021-11-30 DIAGNOSIS — R600 Localized edema: Secondary | ICD-10-CM | POA: Diagnosis not present

## 2021-11-30 DIAGNOSIS — M5441 Lumbago with sciatica, right side: Secondary | ICD-10-CM | POA: Diagnosis not present

## 2021-11-30 MED ORDER — SYNTHROID 150 MCG PO TABS: 150.0000 ug | ORAL_TABLET | Freq: Every day | ORAL | 3 refills | Status: DC

## 2021-11-30 MED ORDER — RIVAROXABAN 20 MG PO TABS: 20.0000 mg | ORAL_TABLET | Freq: Every day | ORAL | 11 refills | Status: DC

## 2021-11-30 MED ORDER — CELECOXIB 100 MG PO CAPS: 100.0000 mg | ORAL_CAPSULE | Freq: Two times a day (BID) | ORAL | 11 refills | Status: DC

## 2021-11-30 NOTE — Patient Instructions (Signed)
It was a pleasure seeing you today. I hope your time spent with [place] was pleasant and helpful. Please let us know if there is anything we can do to improve the service you receive.  ? ?Today we discussed concerns with: ? ?Hypothyroidism, postsurgical ? ?Vitamin D deficiency ? ?Bilateral groin pain ? ?I am going to get some labs. I have also sent a referral for Gynecology.  ? ?The following orders have been placed for you today: ? ?No orders of the defined types were placed in this encounter. ? ? ? ?Important Office Information ?Lab Results ?If labs were ordered, please note that you will see results through [place] as soon as they come available from [place].  ?It takes up to 5 business days for the results to be routed to me and for me to review them once all of the lab results have come through from [facility]. I will make recommendations based on your results and send these through [place] or someone from the office will call you to discuss. If your labs are abnormal, we may contact you to schedule a visit to discuss the results and make recommendations.  ?If you have not heard from [place] within 5 business days or you have waited longer than a week and your lab results have not come through on [facility], please feel free to call the office or send a message through [place] to follow-up on these labs.  ? ?Referrals ?If referrals were placed today, the office where the referral was sent will contact you either by phone or through [place] to set up scheduling. Please note that it can take up to a week for the referral office to contact you. If you do not hear from them in a week, please contact the referral office directly to inquire about scheduling.  ? ?Condition Treated ?If your condition worsens or you begin to have new symptoms, please schedule a follow-up appointment for further evaluation. If you are not sure if an appointment is needed, you may call the office to leave a message for the nurse and someone will contact  you with recommendations.  ?If you have an urgent or life threatening emergency, please do not call the office, but seek emergency evaluation by calling 911 or going to the nearest emergency room for evaluation.  ? ?MyChart and Phone Calls ?Please do not use MyChart for urgent messages. It may take up to 3 business days for MyChart messages to be read by staff and if they are unable to handle the request, an additional 3 business days for them to be routed to me and for my response.  ?Messages sent to the provider through [place] do not come directly to the provider, please allow time for these messages to be routed and for me to respond.  ?We get a large volume of MyChart messages daily and these are responded to in the order received.  ? ?For urgent messages, please call the office at [phone] and speak with the front office staff or leave a message on the line of my assistant for guidance.  ?We are seeing patients from the hours of 8:00 am through 5:00 pm and calls directly to the nurse may not be answered immediately due to seeing patients, but your call will be returned as soon as possible.  ?Phone  messages received after 4:00 PM Monday through Thursday may not be returned until the following business day. Phone messages received after 11:00 AM on Friday may not be returned until  Monday.  ? ?After Hours ?We share on call hours with providers from other offices. If you have an urgent need after hours that cannot wait until the next business day, please contact the on call provider by calling the office number. A nurse will speak with you and contact the provider if needed for recommendations.  ?If you have an urgent or life threatening emergency after hours, please do not call the on call provider, but seek emergency evaluation by calling 911 or going to the nearest emergency room for evaluation.  ? ?Paperwork ?All paperwork requires a minimum of 5 days to complete and return to you or the designated  personnel. Please keep this in mind when bringing in forms or sending requests for paperwork completion to the office.  ?  ?

## 2021-11-30 NOTE — Progress Notes (Signed)
? ?Office Visit  ?  ?Patient Name: Natasha [name] ?Date of Encounter: 11/30/2021 ? ?PCP:  Orma Render, NP ?  ?[place]  ?Cardiologist:  Buford Dresser, MD  ?Advanced Practice Provider:  No care team member to display ?Electrophysiologist:  None  ?   ? ?Chief Complaint  ?  ?Natasha [name] is a 71 y.o. female with a hx of PE, OSA, hypertension, hypothyroidism, COVID-19 requiring hospitalization 08/2019 presents today for follow-up of hypertension ? ?Past Medical History  ?  ?Past Medical History:  ?Diagnosis Date  ? Abdominal spasms 12/14/2020  ? Acute pulmonary embolism ([place]) 08/21/2019  ? Bacterial conjunctivitis of both eyes 12/14/2020  ? Bilateral leg edema 02/10/2020  ? Bradycardia   ? Breast cancer screening by mammogram 11/24/2020  ? CARCINOMA, THYROID GLAND, PAPILLARY 05/04/2008  ? Stage 2, 8/09: thyroidectomy for 2.7cm papillary adenocarcinoma (t2 n0 mo) 9/09: I-131 rx, [facility] 05/10: tg is neg (ab neg) , total body scan is neg  ? Colon cancer screening 11/24/2020  ? Colon polyps   ? Complication of anesthesia   ? trouble with airway  ? Cough 12/25/2007  ? Qualifier: Diagnosis of  By: Doy Mince LPN, Megan    ? COVID-19 08/19/2019  ? Diverticulosis   ? DVT (deep venous thrombosis) ([place])   ? Edema 12/22/2008  ? Encounter to establish care 11/24/2020  ? History of 2019 novel coronavirus disease (COVID-19) 11/09/2019  ? HYPERTENSION 12/25/2007  ? HYPOTHYROIDISM, POSTSURGICAL 05/04/2008  ? LEG PAIN, LEFT 12/09/2008  ? Lumbago with sciatica, right side 01/08/2021  ? Memory loss due to medical condition 11/09/2019  ? Muscle weakness   ? Nausea and vomiting 05/23/2008  ? Qualifier: Diagnosis of  By: Loanne Drilling MD, Roena Malady of this note might be different from the original. Qualifier: Diagnosis of  By: Loanne Drilling MD, Jacelyn Pi  ? Numbness and tingling   ? OSA (obstructive sleep apnea) 06/15/2013  ? CPAP  ? Paresthesia 01/04/2020  ? Peripheral edema 12/22/2008  ? Qualifier: Diagnosis of  By: Johnsie Cancel, MD,  Rona Ravens   ? Pneumonia due to COVID-19 virus   ? Pulmonary embolism ([facility])   ? Pulmonary embolism ([place]) 08/23/2019  ? Right lower quadrant pain 11/24/2020  ? Sciatica of left side 12/04/2011  ? Skin sensation disturbance 12/05/2008  ? Qualifier: Diagnosis of  By: Asa Lente MD, Serita Grit of this note might be different from the original. Qualifier: Diagnosis of  By: Asa Lente MD, Jannifer Rodney  ? Spasm of muscle of lower back 12/14/2020  ? Vaginal candidiasis 03/16/2021  ? Weakness 01/04/2020  ? ?Past Surgical History:  ?Procedure Laterality Date  ? ABDOMINAL HYSTERECTOMY    ? ANKLE SURGERY Right   ? CERVICAL FUSION    ? x 2  ? COLONOSCOPY WITH PROPOFOL N/A 08/08/2015  ? Procedure: COLONOSCOPY WITH PROPOFOL;  Surgeon: Juanita Craver, MD;  Location: [facility];  Service: Endoscopy;  Laterality: N/A;  ? KNEE SURGERY Left   ? TOTAL THYROIDECTOMY    ? ? ?Allergies ? ?Allergies  ?Allergen Reactions  ? Codeine Shortness Of Breath, Palpitations and Other (See Comments)  ? Eggs Or Egg-Derived Products Nausea And Vomiting  ?  PROJECTILE VOMITING  ? ? ?History of Present Illness  ?  ?Natasha [name] is a 71 y.o. female with a hx of COVID-19 08/2019 requiring hospitalization, PE, hypertension, hypothyroidism, thyroid cancer, OSA last seen 04/30/2021 by Dr. Harrell Gave. ? ?She was hospitalized 08/21/2019 -  09/06/2019 due to COVID-19 pneumonia and received steroids and remdesivir.  She was diagnosed with submassive PE and started on Xarelto during admission.  Seen in consult by Dr. Radford Pax noted to have sinus bradycardia in the 40s and 50s.  She has since followed with Dr. Harrell Gave.  She had evidence of right heart strain at time of PE.  Repeat echocardiogram 12/2019 with LVEF 60 to 65%, indeterminate diastolic parameters, trivial MR.  Cardiac monitor 12/2019 with minimum heart rate 44, max heart rate 174, average heart rate 67 bpm.  Predominantly normal sinus rhythm.  Her 5 triggered events were associated with sinus rhythm  with or without PVC.  There were 10 SVT events the longest lasting 9.3 seconds. ? ?She was last seen in clinic 04/30/2021.  She was encouraged to continue using her CPAP, daily weights, salt avoidance.  Her Lasix discontinued for lower extremity edema and component of venous insufficiency was suspected. ? ?She presents today for follow-up.Tells me every January she spends two months with her son who is a Tax adviser Dietitian) in [place]. She had an episode walking on the treadmill and begin to feel "like I was fading". She was told she had a "nodule on her heart", klebsiella UTI, as well as fluid on her lungs. She was treated with antibiotics. Just recently returned from [place]. She saw urology yesterday and urine clear but they sent it for culture as she is having bladder and lower back pain which has recurred since antibiotics. She notes for the last 3-4 days she has been having a "tenderness" in her chest at night while sitting still. She is frustrated and shares with me that she "wants her health back". She notes lower extremity edema, weight gain. Shares with me that she has not taken her PRN Lasix . She has been skipping days of medication as frustrated by how many pills she has to take.  Some improvement since increased dose of Levothyroxine.  ?EKGs/Labs/Other Studies Reviewed:  ? ?The following studies were reviewed today: ?  ?Echo 12/22/19 ? 1. Left ventricular ejection fraction, by estimation, is 60 to 65%. The  ?left ventricle has normal function. The left ventricle has no regional  ?wall motion abnormalities. Left ventricular diastolic parameters are  ?indeterminate.  ? 2. Right ventricular systolic function is normal. The right ventricular  ?size is not well visualized. Tricuspid regurgitation signal is inadequate  ?for assessing PA pressure.  ? 3. The mitral valve is grossly normal. Trivial mitral valve  ?regurgitation. No evidence of mitral stenosis.  ? 4. The aortic valve was not well visualized.  Aortic valve regurgitation  ?is not visualized. No aortic stenosis is present.  ? 5. The inferior vena cava is normal in size with greater than 50%  ?respiratory variability, suggesting right atrial pressure of 3 mmHg.  ? ?Comparison(s): No significant change from prior study.  ?  ?Monitor 12/31/19 ?~7 days of data recorded on Zio monitor. Patient had a min HR of 44 bpm, max HR of 174 bpm, and avg HR of 67 bpm. Predominant underlying rhythm was Sinus Rhythm. No VT, atrial fibrillation, high degree block, or pauses noted. Isolated atrial and ventricular ectopy was rare (<1%). There were 5 triggered events. These were sinus rhythm with/without PVC. There were 10 SVT events, the fastest interval lasting 7 beats with a max rate of 174 bpm, the longest 21 beats/9.3 secs with an avg rate of 131 bpm. ?  ?Echo 08/22/19 ?1. Left ventricular ejection fraction, by visual estimation, is 65  to  ?70%. The left ventricle has hyperdynamic function. There is no left  ?ventricular hypertrophy.  ? 2. The left ventricle has no regional wall motion abnormalities.  ? 3. Global right ventricle has low normal systolic function.The right  ?ventricular size is mildly enlarged. No increase in right ventricular wall  ?thickness.  ? 4. Presence of pericardial fat pad.  ? 5. The mitral valve is grossly normal. Trivial mitral valve  ?regurgitation.  ? 6. The tricuspid valve is grossly normal. Tricuspid valve regurgitation  ?is not demonstrated.  ? 7. The tricuspid valve was normal in structure. Tricuspid valve  ?regurgitation is not demonstrated.  ? 8. The aortic valve is grossly normal. Aortic valve regurgitation is not  ?visualized. No evidence of aortic valve sclerosis or stenosis.  ? 9. The inferior vena cava is dilated in size with <50% respiratory  ?variability, suggesting right atrial pressure of 15 mmHg.  ?10. Technically challenging study. Normal LV function. RV appears to be  ?mildly enlarged, with borderline normal function. Unable to  visualize PA  ?well.  ?11. The interatrial septum was not assessed.  ?  ? ?EKG:  EKG is ordered today.  The ekg ordered today demonstrates NSR 62 bpm with first degree AV block PR 226. No acute ST/T wave chang

## 2021-11-30 NOTE — Patient Instructions (Signed)
Medication Instructions:  ?Your physician has recommended you make the following change in your medication: ? ?Please take your Furosemide '20mg'$  daily for 3 days. Then return to take as-needed for swelling, weight gain of 2 pounds overnight or 5 pounds in one week.  ? ?Please try to take your medications regularly.  ? ?*If you need a refill on your cardiac medications before your next appointment, please call your pharmacy* ? ? ?Lab Work: ?Your physician recommends that you return for lab work today: BNP, CMP, CBC, A1c ? ?If you have labs (blood work) drawn today and your tests are completely normal, you will receive your results only by: ?MyChart Message (if you have MyChart) OR ?A paper copy in the mail ?If you have any lab test that is abnormal or we need to change your treatment, we will call you to review the results. ? ? ?Testing/Procedures: ?Your EKG today showed normal sinus rhythm which is a good result.  ? ?Your physician has requested that you have an echocardiogram. Echocardiography is a painless test that uses sound waves to create images of your heart. It provides your doctor with information about the size and shape of your heart and how well your heart?s chambers and valves are working. This procedure takes approximately one hour. There are no restrictions for this procedure.  ? ?Follow-Up: ?At [facility], you and your health needs are our priority.  As part of our continuing mission to provide you with exceptional heart care, we have created designated Provider Care Teams.  These Care Teams include your primary Cardiologist (physician) and Advanced Practice Providers (APPs -  Physician Assistants and Nurse Practitioners) who all work together to provide you with the care you need, when you need it. ? ?We recommend signing up for the patient portal called "MyChart".  Sign up information is provided on this After Visit Summary.  MyChart is used to connect with patients for Virtual Visits  (Telemedicine).  Patients are able to view lab/test results, encounter notes, upcoming appointments, etc.  Non-urgent messages can be sent to your provider as well.   ?To learn more about what you can do with MyChart, go to [email or web address].   ? ?Your next appointment:   ?6-8 weeks ? ?The format for your next appointment:   ?In Person ? ?Provider:   ?Buford Dresser, MD  ? ? ?Other Instructions ? ?Heart Healthy Diet Recommendations: ?A low-salt diet is recommended. Meats should be grilled, baked, or boiled. Avoid fried foods. Focus on lean protein sources like fish or chicken with vegetables and fruits. The American Heart Association is a Microbiologist!  American Heart Association Diet and Lifeystyle Recommendations   ? ?Exercise recommendations: ?The American Heart Association recommends 150 minutes of moderate intensity exercise weekly. ?Try 30 minutes of moderate intensity exercise 4-5 times per week. ?This could include walking, jogging, or swimming. ? ?Recommend weighing daily and keeping a log. Please call our office if you have weight gain of 2 pounds overnight or 5 pounds in 1 week.  ? ?Date ? Time Weight  ? ?    ? ?    ? ?    ? ?    ? ?    ? ?    ? ?    ? ?    ? ? ?

## 2021-12-01 LAB — COMPREHENSIVE METABOLIC PANEL
ALT: 14 IU/L (ref 0–32)
AST: 16 IU/L (ref 0–40)
Albumin/Globulin Ratio: 1.6 (ref 1.2–2.2)
Albumin: 4.4 g/dL (ref 3.7–4.7)
Alkaline Phosphatase: 116 IU/L (ref 44–121)
BUN/Creatinine Ratio: 19 (ref 12–28)
BUN: 16 mg/dL (ref 8–27)
Bilirubin Total: 0.3 mg/dL (ref 0.0–1.2)
CO2: 24 mmol/L (ref 20–29)
Calcium: 10 mg/dL (ref 8.7–10.3)
Chloride: 106 mmol/L (ref 96–106)
Creatinine, Ser: 0.86 mg/dL (ref 0.57–1.00)
Globulin, Total: 2.8 g/dL (ref 1.5–4.5)
Glucose: 86 mg/dL (ref 70–99)
Potassium: 4.2 mmol/L (ref 3.5–5.2)
Sodium: 143 mmol/L (ref 134–144)
Total Protein: 7.2 g/dL (ref 6.0–8.5)
eGFR: 72 mL/min/{1.73_m2} (ref >59)

## 2021-12-01 LAB — CBC
Hematocrit: 38.5 % (ref 34.0–46.6)
Hemoglobin: 12.4 g/dL (ref 11.1–15.9)
MCH: 28.6 pg (ref 26.6–33.0)
MCHC: 32.2 g/dL (ref 31.5–35.7)
MCV: 89 fL (ref 79–97)
Platelets: 277 10*3/uL (ref 150–450)
RBC: 4.33 x10E6/uL (ref 3.77–5.28)
RDW: 13.5 % (ref 11.7–15.4)
WBC: 6.5 10*3/uL (ref 3.4–10.8)

## 2021-12-01 LAB — BRAIN NATRIURETIC PEPTIDE: BNP: 20.2 pg/mL (ref 0.0–100.0)

## 2021-12-01 LAB — T3: T3, Total: 107 ng/dL (ref 71–180)

## 2021-12-01 LAB — HEMOGLOBIN A1C
Est. average glucose Bld gHb Est-mCnc: 114 mg/dL
Hgb A1c MFr Bld: 5.6 % (ref 4.8–5.6)

## 2021-12-01 LAB — T4: T4, Total: 12.1 ug/dL — ABNORMAL HIGH (ref 4.5–12.0)

## 2021-12-01 LAB — TSH: TSH: 0.141 u[IU]/mL — ABNORMAL LOW (ref 0.450–4.500)

## 2021-12-01 LAB — VITAMIN D 25 HYDROXY (VIT D DEFICIENCY, FRACTURES): Vit D, 25-Hydroxy: 28.8 ng/mL — ABNORMAL LOW (ref 30.0–100.0)

## 2021-12-05 ENCOUNTER — Other Ambulatory Visit (HOSPITAL_BASED_OUTPATIENT_CLINIC_OR_DEPARTMENT_OTHER): Payer: Self-pay | Admitting: Nurse Practitioner

## 2021-12-05 DIAGNOSIS — E039 Hypothyroidism, unspecified: Secondary | ICD-10-CM

## 2021-12-05 DIAGNOSIS — E559 Vitamin D deficiency, unspecified: Secondary | ICD-10-CM

## 2021-12-05 DIAGNOSIS — M6281 Muscle weakness (generalized): Secondary | ICD-10-CM

## 2021-12-05 MED ORDER — SYNTHROID 150 MCG PO TABS: ORAL_TABLET | ORAL | 3 refills | Status: DC

## 2021-12-05 MED ORDER — VITAMIN D (ERGOCALCIFEROL) 1.25 MG (50000 UNIT) PO CAPS: 50000.0000 [IU] | ORAL_CAPSULE | ORAL | 0 refills | Status: DC

## 2021-12-06 NOTE — Progress Notes (Signed)
Patient is aware of the results and verbalized understanding. Call transferred to front desk for scheduling. AS< CMA ?

## 2021-12-07 NOTE — Progress Notes (Signed)
? ?Acute Office Visit ? ?Subjective:  ? ? Patient ID: Natasha [name], female    DOB: [birth year], 71 y.o.   MRN: [id number] ? ?Chief Complaint  ?Patient presents with  ? Urinary Tract Infection  ? ? ?HPI ?Patient is in today for concerns for UTI. She would also like to discuss thyroid, weight, and groin pain.  ? ?UTI ?She was recently diagnosed with klebsiella UTI at [facility]. She was started on antibiotics and started feeling better, but then a few days later the pain returned in her groin and with urination. She was seen by urology and urine testing was performed. A culture is currently pending.  ? ?Thyroid ?Natasha [name] was seen by endocrinology while visiting with her son in [place] from [name] through March. The endocrinologist increased her levothyroxine to 157mg at that time. She has not had labs rechecked.  ?She reports that she feels a wheezing in her throat, similar to when she had thyroid cancer. She is also experiencing tenderness in her chest in the superior sternal area.  ? ?Weight ?She endorses frustration over efforts for [place] loss[place] and monitoring her diet very closely. Despite those efforts, she has gained about 20 pounds since her last visit in the office and is not sure why. She reports that she is not eating any sugar in her diet and keeping her carbohydrate intake very low. She is not eating any added salt. She is taking in 1-2 meals a day.  ? ?Groin Pain ?She endorses ongoing lower back pain and groin pain and reports that at this time it feels similar to menstrual cramps. She has not seen gynecology in some time and would like a referral for evaluation to see if a gynecological cause could be contributing to her pain.  ? ? ?Hx, Medications, and Allergies updated and listed in chart.  ?Review of Systems ?All review of systems negative except what is listed in the HPI ? ?   ?Objective:  ?  ?Physical Exam ?Vitals and nursing note reviewed.   ?Constitutional:   ?   Appearance: Normal appearance. She is obese.  ?HENT:  ?   Head: Normocephalic.  ?Eyes:  ?   Extraocular Movements: Extraocular movements intact.  ?   Conjunctiva/sclera: Conjunctivae normal.  ?   Pupils: Pupils are equal, round, and reactive to light.  ?Neck:  ?   Thyroid: No thyroid mass, thyromegaly or thyroid tenderness.  ?Cardiovascular:  ?   Rate and Rhythm: Normal rate and regular rhythm.  ?   Pulses: Normal pulses.  ?   Heart sounds: Normal heart sounds. No murmur heard. ?Pulmonary:  ?   Effort: Pulmonary effort is normal.  ?   Breath sounds: Normal breath sounds. No wheezing.  ?Abdominal:  ?   General: Bowel sounds are normal. There is no distension.  ?   Tenderness: There is no abdominal tenderness. There is no guarding.  ?Musculoskeletal:  ?   Cervical back: Normal range of motion. No tenderness.  ?   Right lower leg: Edema present.  ?   Left lower leg: Edema present.  ?Lymphadenopathy:  ?   Cervical: No cervical adenopathy.  ?Skin: ?   General: Skin is warm and dry.  ?   Capillary Refill: Capillary refill takes less than 2 seconds.  ?Neurological:  ?   General: No focal deficit present.  ?   Mental Status: She is alert and oriented to person, place, and  time.  ?Psychiatric:     ?   Mood and Affect: Mood normal.     ?   Behavior: Behavior normal.     ?   Thought Content: Thought content normal.     ?   Judgment: Judgment normal.  ? ? ?BP 138/76   Pulse 62   Temp 98.7 ?F (37.1 ?C)   Resp (!) 99   Ht '5\' 7"'$  (1.702 m)   Wt (!) 319 lb (144.7 kg)   BMI 49.96 kg/m?  ?Wt Readings from Last 3 Encounters:  ?11/30/21 (!) 319 lb (144.7 kg)  ?11/30/21 (!) 319 lb (144.7 kg)  ?05/22/21 300 lb (136.1 kg)  ?   ?Assessment & Plan:  ? ?Problem List Items Addressed This Visit   ? ? History of papillary adenocarcinoma of thyroid  ?  Voice changes similar to initial cancer diagnosis noted by patient. Will obtain labs today. Recommend evaluation with endocrinology for recommendations on monitoring  for cancer recurrence. No alarm symptoms at this time.  ?  ?  ? Hypothyroidism, postsurgical - Primary  ?  Chronic. Recent increase in levothyroxine by endocrine in [place]. Will monitor labs today to ensure dosing is appropriate.  ?Recommend referral to endocrine for further evaluation. She is having vocal changes consistent with symptoms when her cancer first started, which is concerning. No other concerning symptoms present. Will send referral and make changes to plan of care based on labs.  ?  ?  ? Relevant Orders  ? TgAb+Thyroglobulin IMA or LCMS  ? TSH (Completed)  ? T4 (Completed)  ? T3 (Completed)  ? Primary hypertension  ?  BP slightly elevated today.  ?Will obtain labs- no current sx present.  ?Continue with increased exercise and dietary modifications. Alert immediately of new symptoms.  ?Monitor BP at home and report readings >140/85 ?  ?  ? Relevant Medications  ? rivaroxaban (XARELTO) 20 MG TABS tablet  ? Bilateral groin pain  ?  Groin pain of unknown etiology. Unclear at this time if this is related to her low back pain or something different. No unusual bleeding present. History of bladder issues but no known gynecological concerns. Will send referral today for further evaluation and recommendations.  ?  ?  ? Relevant Medications  ? celecoxib (CELEBREX) 100 MG capsule  ? Other Relevant Orders  ? Ambulatory referral to Obstetrics / Gynecology  ? Chronic right-sided low back pain with right-sided sciatica  ?  Chronic. Will refill celebrex today.  ?Recommend continue with walking and regular exercise as this is helpful for joints.  ?  ?  ? Relevant Medications  ? celecoxib (CELEBREX) 100 MG capsule  ? Aortic atherosclerosis ([place])  ?  Chronic. In setting of DM, HTN, obesity, and CHF we need strict diet and activity monitoring to help reduce risks.  ?Recommend continue with daily exercise and diet modifications. Labs today.  ?  ?  ? Relevant Medications  ? rivaroxaban (XARELTO) 20 MG TABS tablet  ? Right-sided  heart failure ([place])  ?  Chronic. No new or alarm sx present today. I am please she has been exercising more and encourage her to continue this- this should be greatly beneficial to her overall health.  ?Will monitor labs today.  ?  ?  ? Relevant Medications  ? rivaroxaban (XARELTO) 20 MG TABS tablet  ? Vitamin D deficiency  ? Relevant Orders  ? VITAMIN D 25 Hydroxy (Vit-D Deficiency, Fractures) (Completed)  ? History of pulmonary embolism  ?  Relevant Medications  ? rivaroxaban (XARELTO) 20 MG TABS tablet  ? ?Other Visit Diagnoses   ? ? Other chronic pain      ? Relevant Medications  ? celecoxib (CELEBREX) 100 MG capsule  ? ?  ? ? ? ?Meds ordered this encounter  ?Medications  ? DISCONTD: SYNTHROID 150 MCG tablet  ?  Sig: Take 1 tablet (150 mcg total) by mouth daily.  ?  Dispense:  90 tablet  ?  Refill:  3  ? rivaroxaban (XARELTO) 20 MG TABS tablet  ?  Sig: Take 1 tablet (20 mg total) by mouth daily with supper.  ?  Dispense:  30 tablet  ?  Refill:  11  ? celecoxib (CELEBREX) 100 MG capsule  ?  Sig: Take 1 capsule (100 mg total) by mouth 2 (two) times daily.  ?  Dispense:  60 capsule  ?  Refill:  11  ? ? ? ?Orma Render, NP ? ?

## 2021-12-08 ENCOUNTER — Encounter (HOSPITAL_BASED_OUTPATIENT_CLINIC_OR_DEPARTMENT_OTHER): Payer: Self-pay | Admitting: Nurse Practitioner

## 2021-12-08 NOTE — Assessment & Plan Note (Signed)
BP slightly elevated today.  ?Will obtain labs- no current sx present.  ?Continue with increased exercise and dietary modifications. Alert immediately of new symptoms.  ?Monitor BP at home and report readings >140/85 ?

## 2021-12-08 NOTE — Assessment & Plan Note (Signed)
Voice changes similar to initial cancer diagnosis noted by patient. Will obtain labs today. Recommend evaluation with endocrinology for recommendations on monitoring for cancer recurrence. No alarm symptoms at this time.  ?

## 2021-12-08 NOTE — Assessment & Plan Note (Addendum)
Groin pain of unknown etiology. Unclear at this time if this is related to her low back pain or something different. No unusual bleeding present. History of bladder issues- await recent culture from urology. No known gynecological concerns. Will send referral today for further evaluation and recommendations.  ?

## 2021-12-08 NOTE — Assessment & Plan Note (Signed)
Chronic. Will refill celebrex today.  ?Recommend continue with walking and regular exercise as this is helpful for joints.  ?

## 2021-12-08 NOTE — Assessment & Plan Note (Signed)
Chronic. Recent increase in levothyroxine by endocrine in [place]. Will monitor labs today to ensure dosing is appropriate.  ?Recommend referral to endocrine for further evaluation. She is having vocal changes consistent with symptoms when her cancer first started, which is concerning. No other concerning symptoms present. Will send referral and make changes to plan of care based on labs.  ?

## 2021-12-08 NOTE — Assessment & Plan Note (Signed)
Chronic. In setting of DM, HTN, obesity, and CHF we need strict diet and activity monitoring to help reduce risks.  ?Recommend continue with daily exercise and diet modifications. Labs today.  ?

## 2021-12-08 NOTE — Assessment & Plan Note (Signed)
Chronic. No new or alarm sx present today. I am please she has been exercising more and encourage her to continue this- this should be greatly beneficial to her overall health.  ?Will monitor labs today.  ?

## 2021-12-10 ENCOUNTER — Ambulatory Visit (INDEPENDENT_AMBULATORY_CARE_PROVIDER_SITE_OTHER): Payer: Medicare PPO

## 2021-12-10 DIAGNOSIS — I10 Essential (primary) hypertension: Secondary | ICD-10-CM

## 2021-12-10 DIAGNOSIS — G4733 Obstructive sleep apnea (adult) (pediatric): Secondary | ICD-10-CM | POA: Diagnosis not present

## 2021-12-10 LAB — ECHOCARDIOGRAM COMPLETE
AR max vel: 2.61 cm2
AV Area VTI: 2.24 cm2
AV Area mean vel: 2.46 cm2
AV Mean grad: 4 mmHg
AV Peak grad: 8.1 mmHg
Ao pk vel: 1.42 m/s
Area-P 1/2: 3.17 cm2
Calc EF: 64.7 %
S' Lateral: 2.58 cm
Single Plane A2C EF: 59.7 %
Single Plane A4C EF: 69 %

## 2021-12-12 ENCOUNTER — Encounter (HOSPITAL_BASED_OUTPATIENT_CLINIC_OR_DEPARTMENT_OTHER): Payer: Self-pay

## 2021-12-12 IMAGING — CT CT HEAD W/O CM
1 of 3 series · 15 of 30 positions shown, 19 images · non-contrast
Comparison: 08/14/2005

CLINICAL DATA: Stroke-like symptoms

EXAM:
CT HEAD WITHOUT CONTRAST
CT CERVICAL SPINE WITHOUT CONTRAST
TECHNIQUE: Multidetector CT imaging of the head and cervical spine was
performed following the standard protocol without intravenous
contrast. Multiplanar CT image reconstructions of the cervical spine
were also generated.

[Series 3: head w/o thins · axial · non-contrast · 0.47mm/px · z∈[-96,+57]mm · 15 of 144 slices shown, 19 images]
[im 8/144  brain]
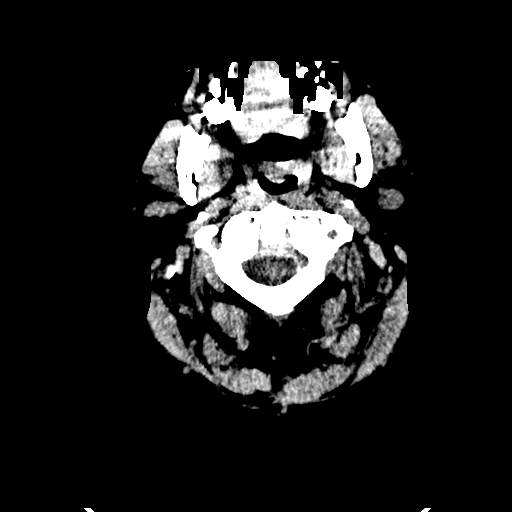
[im 8/144  bone]
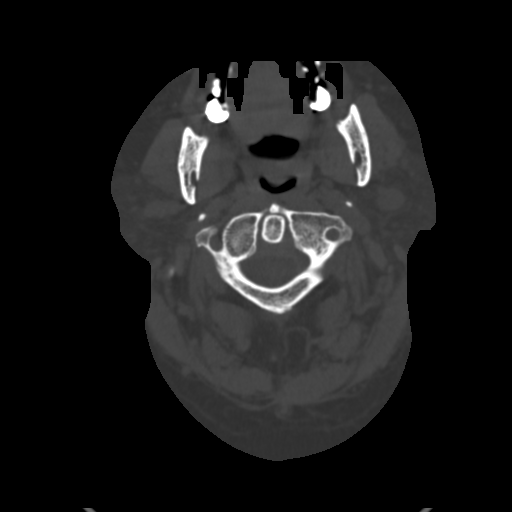
[im 16/144  brain]
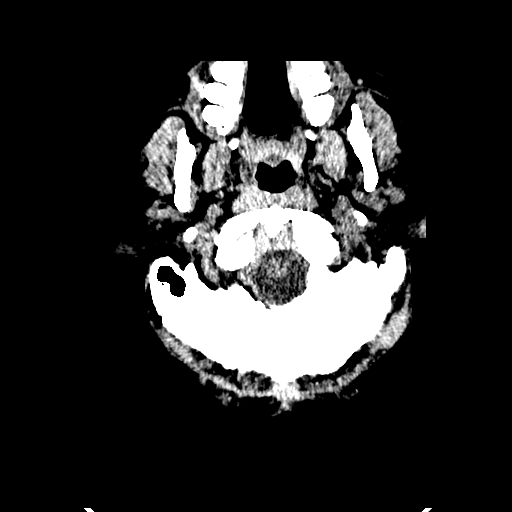
[im 31/144  brain]
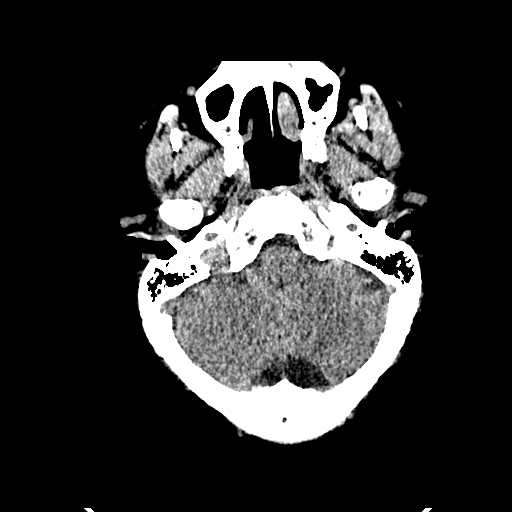
[im 38/144  brain]
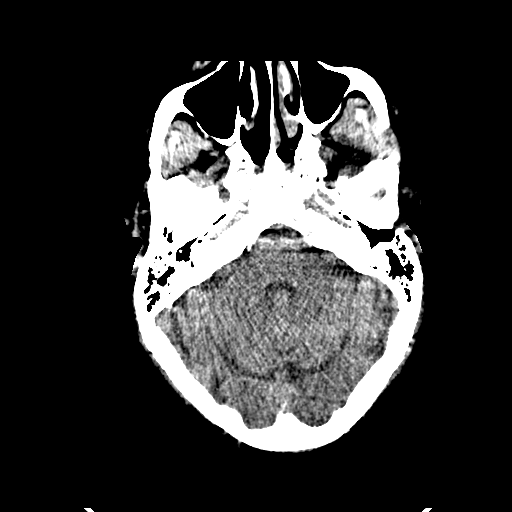
[im 46/144  brain]
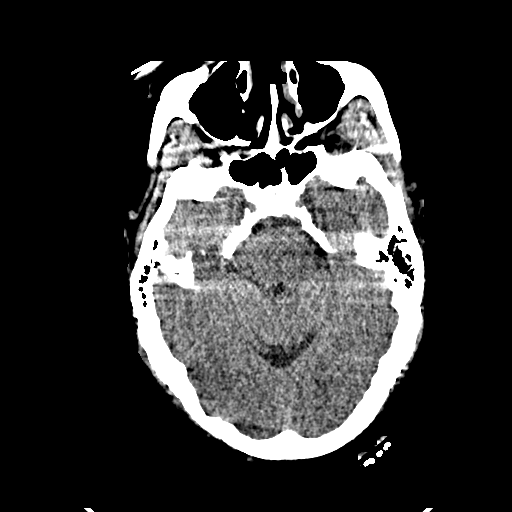
[im 46/144  bone]
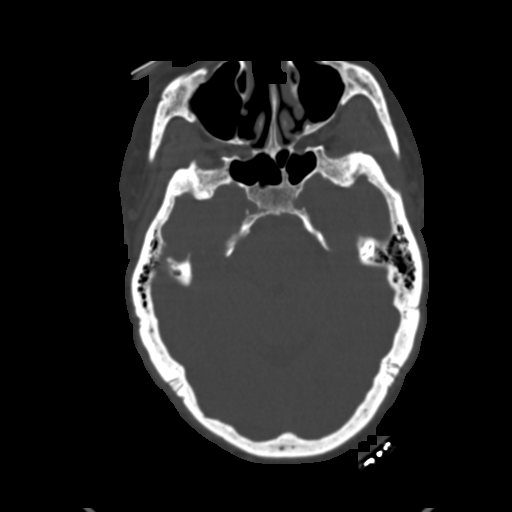
[im 53/144  brain]
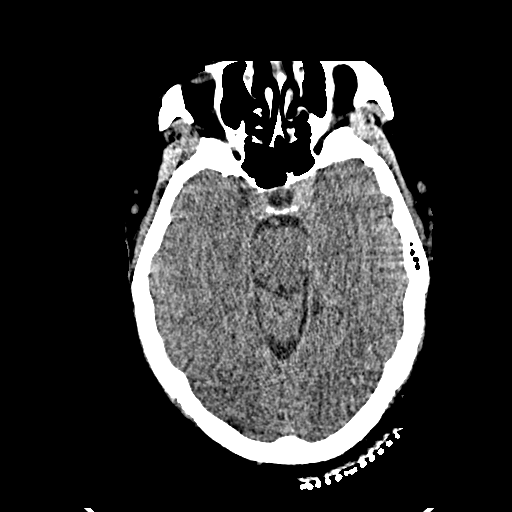
[im 61/144  brain]
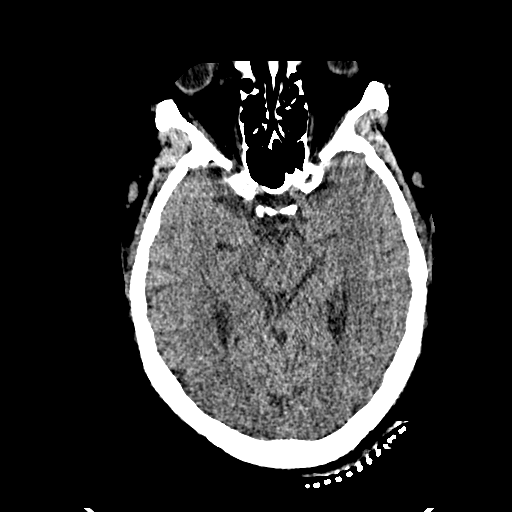
[im 76/144  brain]
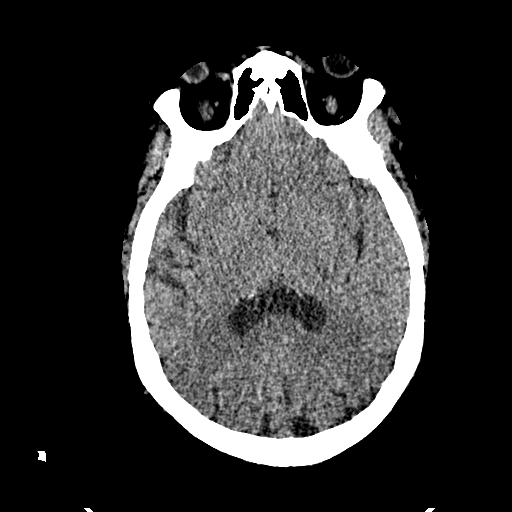
[im 83/144  brain]
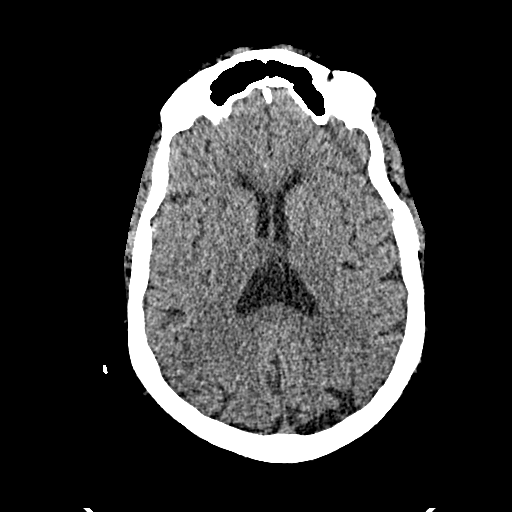
[im 83/144  bone]
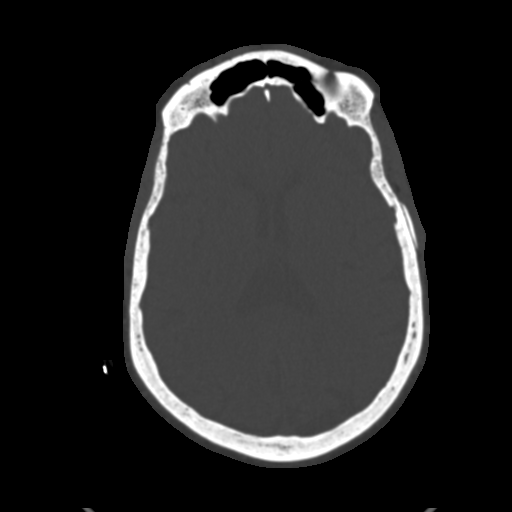
[im 91/144  brain]
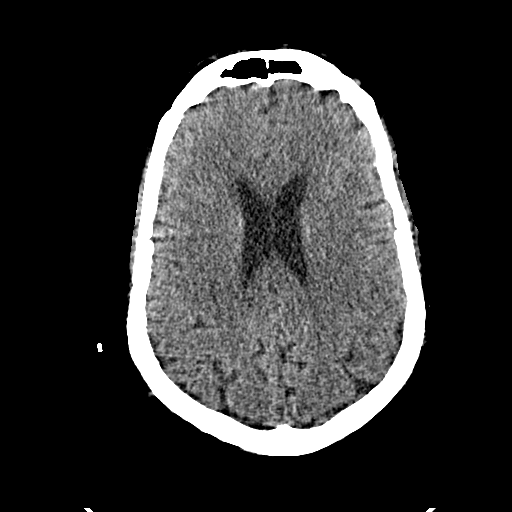
[im 98/144  brain]
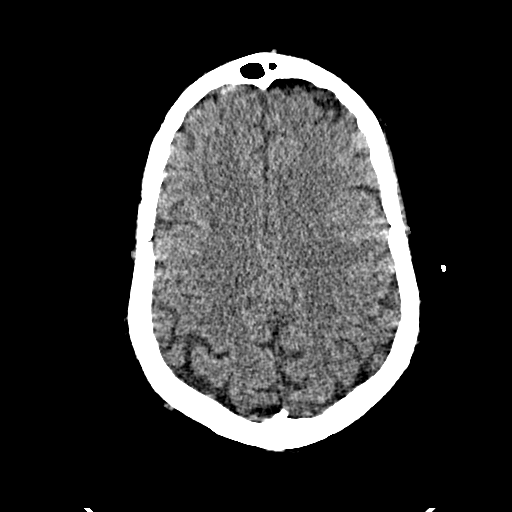
[im 106/144  brain]
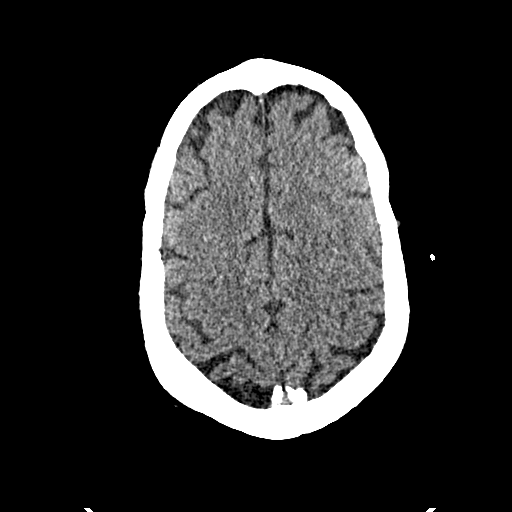
[im 121/144  brain]
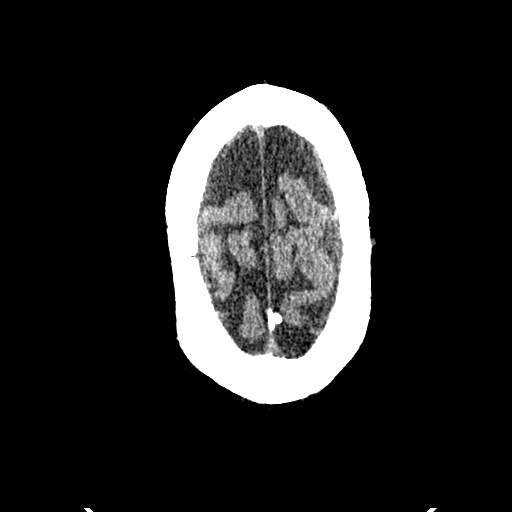
[im 121/144  bone]
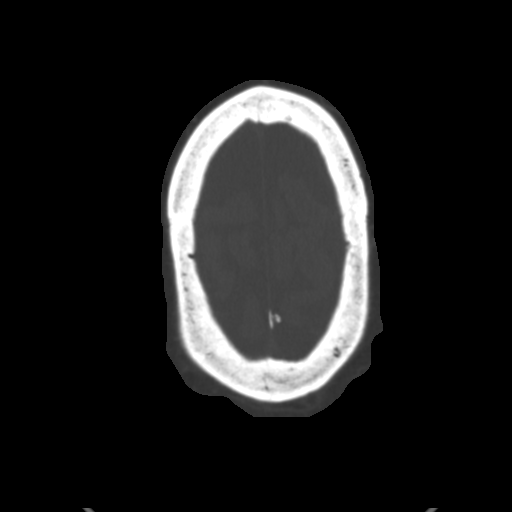
[im 128/144  brain]
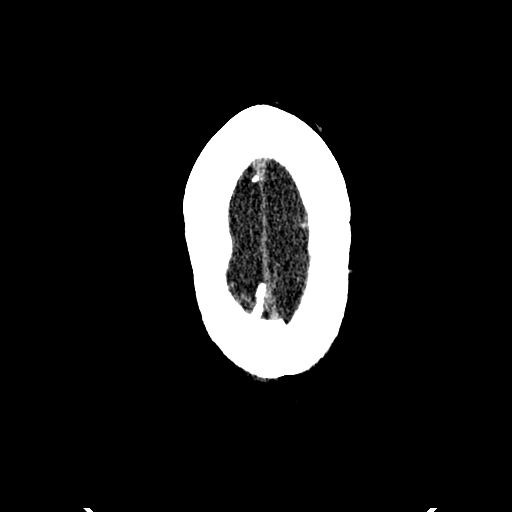
[im 136/144  brain]
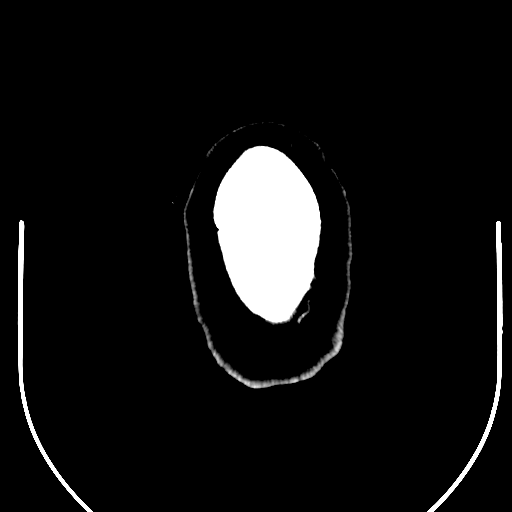

[15 of 30 positions shown; findings below may reference images not displayed]

FINDINGS: CT HEAD FINDINGS

Brain: Mild atrophic changes are noted. No findings to suggest acute
hemorrhage, acute infarction or space-occupying mass lesion are
seen.

Vascular: No hyperdense vessel or unexpected calcification.

Skull: Normal. Negative for fracture or focal lesion.

Sinuses/Orbits: No acute finding.

Other: None.

CT CERVICAL SPINE FINDINGS

Alignment: Within normal limits.

Skull base and vertebrae: 7 cervical segments are well visualized.
There are interbody fusion changes at C3-4 with anterior fixation.
Additionally changes of prior fusion at C4-5, C5-6 and C6-7 are
noted. Mild facet hypertrophic changes are noted. No acute fracture
or acute facet abnormality is seen.

Soft tissues and spinal canal: Surrounding soft tissue structures
are within normal limits.

Upper chest: Visualized lung apices are unremarkable.

Other: None
IMPRESSION: CT of the head: Chronic changes without acute abnormality.

CT of cervical spine: Degenerative and postsurgical changes without
acute abnormality.

## 2021-12-12 NOTE — Progress Notes (Signed)
Addendum 12/12/21: ? ?Records received from [facility].  She presented with 3 day hx of fever, SOB, nausea, vomiting x 3 days as well as dysuria, urgency, frequency. COVID, flu negative. CBC unremarkable. BMP with K 3.6, creat 0.9. Urine positive for klebsiella. CXR 'tortuous thoracic aorta. Borderline cardiomegaly. Scarring in the left upper lung zone. No focal infiltrate.". She was provided with PO abx for UTI.  ? ?Recommendations: ?No evidence of nodule in heart' as pt reported in OV. Likely referring to scarring noted on CXR. Mychart message sent to patient for reassurance. ?Echo completed 12/10/21 normal LVEF, normal aorta measurements - no heart failure. ?UTI mgmt per PCP ?Follow up as scheduled. ? ?Loel Dubonnet, NP  ?

## 2022-01-16 ENCOUNTER — Ambulatory Visit (HOSPITAL_BASED_OUTPATIENT_CLINIC_OR_DEPARTMENT_OTHER): Payer: Medicare PPO | Admitting: Obstetrics & Gynecology

## 2022-01-16 ENCOUNTER — Encounter (HOSPITAL_BASED_OUTPATIENT_CLINIC_OR_DEPARTMENT_OTHER): Payer: Self-pay | Admitting: Obstetrics & Gynecology

## 2022-01-16 VITALS — BP 157/90 | HR 65 | Ht 67.0 in | Wt 316.8 lb

## 2022-01-16 DIAGNOSIS — R319 Hematuria, unspecified: Secondary | ICD-10-CM

## 2022-01-16 DIAGNOSIS — R350 Frequency of micturition: Secondary | ICD-10-CM

## 2022-01-16 DIAGNOSIS — L989 Disorder of the skin and subcutaneous tissue, unspecified: Secondary | ICD-10-CM

## 2022-01-16 DIAGNOSIS — Z90710 Acquired absence of both cervix and uterus: Secondary | ICD-10-CM

## 2022-01-16 DIAGNOSIS — R1031 Right lower quadrant pain: Secondary | ICD-10-CM

## 2022-01-16 LAB — POCT URINALYSIS DIPSTICK
Bilirubin, UA: NEGATIVE
Glucose, UA: NEGATIVE
Ketones, UA: NEGATIVE
Nitrite, UA: NEGATIVE
Protein, UA: NEGATIVE
Spec Grav, UA: 1.02 (ref 1.010–1.025)
Urobilinogen, UA: 0.2 E.U./dL
pH, UA: 7.5 (ref 5.0–8.0)

## 2022-01-16 MED ORDER — NITROFURANTOIN MONOHYD MACRO 100 MG PO CAPS: 100.0000 mg | ORAL_CAPSULE | Freq: Two times a day (BID) | ORAL | 0 refills | Status: DC

## 2022-01-16 NOTE — Progress Notes (Signed)
GYNECOLOGY  VISIT ? ?CC:   R groin pain ? ?HPI: ?71 y.o. G86P0 Widowed Black or [place] American female here for intermittent groin pain that has been present off and on for that has been present for more than a year.  She's had GI evaluation and had a colonoscopy last year.  This was done at [facility].  She has undergone a CT with stone protocol, abdominal ultrasound and MR of pelvis.  Reviewed all of these in Epic.  She does have osteoarthritis of the hips, sever superior labral degeneration, degenerative disc disease in the lower spine. ? ?Has hysterectomy due to bleeding around 1997.  To the best of her knowledge, her cervix was removed.  She reports prior ob/gyn advised "something had been left" so she wants evaluation for this today.  Dr. Ruthann Cancer did her surgery.  Denies vaginal bleeding.  Does have some intermittent vaginal discharge. ? ?More recently, she's been having urinary frequency and urinary urgency.  She is taking lasix for swelling but is having a lot more urgency at night.  Is taking tropsium but is really having issues with sleep due to amount of voiding that is occurring at night.  Suggested she decrease to just AM dosing but to communicate with Jacolyn Reedy about this.  Also, dip urine today with red and white cells.  Will send culture. ? ?Lastly, has questions about multiple skin lesions present.  Wants to know treatment options. ? ? ?Patient Active Problem List  ? Diagnosis Date Noted  ? Abdominal pain, RUQ 04/20/2021  ? Fatty liver 04/20/2021  ? Incontinence of urine in female 04/20/2021  ? Wheezing 04/20/2021  ? Vitamin D deficiency 03/16/2021  ? Hip pain 12/22/2020  ? Constipation 11/24/2020  ? Gastroesophageal reflux disease 11/24/2020  ? Bilateral groin pain 11/24/2020  ? Chronic right-sided low back pain with right-sided sciatica 11/24/2020  ? OAB (overactive bladder) 11/24/2020  ? Aortic atherosclerosis ([place]) 11/24/2020  ? Right-sided heart failure ([place]) 11/24/2020  ? History of  pulmonary embolism 02/10/2020  ? Gait abnormality 01/04/2020  ? Muscle weakness (generalized) 11/09/2019  ? Numbness and tingling of both feet 11/09/2019  ? Pulmonary nodule 11/09/2019  ? Obesity, Class III, BMI 40-49.9 (morbid obesity) ([place]) 08/24/2019  ? Bradycardia   ? OSA (obstructive sleep apnea) 06/15/2013  ? Allergic rhinitis 01/21/2009  ? Edema 12/22/2008  ? History of papillary adenocarcinoma of thyroid 05/04/2008  ? Hypothyroidism, postsurgical 05/04/2008  ? Primary hypertension 12/25/2007  ? Dyspnea 12/25/2007  ? ? ?Past Medical History:  ?Diagnosis Date  ? Abdominal spasms 12/14/2020  ? Acute pulmonary embolism ([place]) 08/21/2019  ? Bacterial conjunctivitis of both eyes 12/14/2020  ? Bilateral leg edema 02/10/2020  ? Bradycardia   ? Breast cancer screening by mammogram 11/24/2020  ? CARCINOMA, THYROID GLAND, PAPILLARY 05/04/2008  ? Stage 2, 8/09: thyroidectomy for 2.7cm papillary adenocarcinoma (t2 n0 mo) 9/09: I-131 rx, [facility] 05/10: tg is neg (ab neg) , total body scan is neg  ? Colon cancer screening 11/24/2020  ? Colon polyps   ? Complication of anesthesia   ? trouble with airway  ? Cough 12/25/2007  ? Qualifier: Diagnosis of  By: Doy Mince LPN, Megan    ? COVID-19 08/19/2019  ? Diverticulosis   ? DVT (deep venous thrombosis) ([place])   ? Edema 12/22/2008  ? Encounter to establish care 11/24/2020  ? History of 2019 novel coronavirus disease (COVID-19) 11/09/2019  ? HYPERTENSION 12/25/2007  ? HYPOTHYROIDISM, POSTSURGICAL 05/04/2008  ? LEG PAIN, LEFT 12/09/2008  ?  Lumbago with sciatica, right side 01/08/2021  ? Memory loss due to medical condition 11/09/2019  ? Muscle weakness   ? Nausea and vomiting 05/23/2008  ? Qualifier: Diagnosis of  By: Loanne Drilling MD, Roena Malady of this note might be different from the original. Qualifier: Diagnosis of  By: Loanne Drilling MD, Jacelyn Pi  ? Numbness and tingling   ? OSA (obstructive sleep apnea) 06/15/2013  ? CPAP  ? Paresthesia 01/04/2020  ? Peripheral edema 12/22/2008  ? Qualifier: Diagnosis of   By: Johnsie Cancel, MD, Rona Ravens   ? Pneumonia due to COVID-19 virus   ? Pulmonary embolism ([place])   ? Pulmonary embolism ([place]) 08/23/2019  ? Right lower quadrant pain 11/24/2020  ? Sciatica of left side 12/04/2011  ? Skin sensation disturbance 12/05/2008  ? Qualifier: Diagnosis of  By: Asa Lente MD, Serita Grit of this note might be different from the original. Qualifier: Diagnosis of  By: Asa Lente MD, Jannifer Rodney  ? Spasm of muscle of lower back 12/14/2020  ? Vaginal candidiasis 03/16/2021  ? Weakness 01/04/2020  ? ? ?Past Surgical History:  ?Procedure Laterality Date  ? ABDOMINAL HYSTERECTOMY    ? due to bleeding, ovaries remain  ? ANKLE SURGERY Right   ? CERVICAL FUSION    ? x 2  ? COLONOSCOPY WITH PROPOFOL N/A 08/08/2015  ? Procedure: COLONOSCOPY WITH PROPOFOL;  Surgeon: Juanita Craver, MD;  Location: [facility];  Service: Endoscopy;  Laterality: N/A;  ? KNEE SURGERY Left   ? TOTAL THYROIDECTOMY    ? ? ?MEDS:   ?Current Outpatient Medications on File Prior to Visit  ?Medication Sig Dispense Refill  ? amLODipine (NORVASC) 5 MG tablet Take 1 tablet (5 mg total) by mouth at bedtime. For Blood Pressure 30 tablet 11  ? celecoxib (CELEBREX) 100 MG capsule Take 1 capsule (100 mg total) by mouth 2 (two) times daily. 60 capsule 11  ? furosemide (LASIX) 20 MG tablet Take 1 tablet (20 mg total) by mouth 2 (two) times daily as needed for fluid. 60 tablet 11  ? GAVILYTE-G 236 g solution SMARTSIG:400 Milliliter(s) By Mouth As Directed    ? Krill Oil 1000 MG CAPS Take 1,000 mg by mouth 2 (two) times daily.    ? montelukast (SINGULAIR) 10 MG tablet Take 1 tablet (10 mg total) by mouth at bedtime. 90 tablet 3  ? Multiple Vitamin (MULTIVITAMIN WITH MINERALS) TABS tablet Take 1 tablet by mouth daily.    ? pantoprazole (PROTONIX) 40 MG tablet Take 1 tablet (40 mg total) by mouth 2 (two) times daily. 60 tablet 6  ? rivaroxaban (XARELTO) 20 MG TABS tablet Take 1 tablet (20 mg total) by mouth daily with supper. 30 tablet 11  ?  SYNTHROID 150 MCG tablet Take 149mg by mouth 5 days a week and 719m by mouth 2 days a week. 90 tablet 3  ? Trospium Chloride 60 MG CP24 Take 1 capsule by mouth daily at 12 noon.    ? valsartan-hydrochlorothiazide (DIOVAN-HCT) 160-12.5 MG tablet Take 1 tablet by mouth daily. For blood pressure 30 tablet 11  ? Vitamin D, Ergocalciferol, (DRISDOL) 1.25 MG (50000 UNIT) CAPS capsule Take 1 capsule (50,000 Units total) by mouth every 7 (seven) days. Take for 8 total doses(weeks) 8 capsule 0  ? ?No current facility-administered medications on file prior to visit.  ? ? ?ALLERGIES: Codeine and Eggs or egg-derived products ? ?Family History  ?Problem Relation Age of Onset  ? Heart disease Mother   ?  Kidney disease Mother   ? Other Father   ?     unsure of medical history  ? ? ?SH:  widowed, non smoker ? ?Review of Systems  ?Constitutional: Negative.   ?Genitourinary:  Positive for frequency and urgency.  ?     Incontinence  ? ?PHYSICAL EXAMINATION:   ? ?BP (!) 157/90 (BP Location: Left Arm, Patient Position: Sitting, Cuff Size: Large)   Pulse 65   Ht '5\' 7"'$  (1.702 m) Comment: reported  Wt (!) 316 lb 12.8 oz (143.7 kg)   BMI 49.62 kg/m?     ?General appearance: alert, cooperative and appears stated age ?Breasts: normal appearance, no masses or tenderness ?Abdomen: soft, non-tender; bowel sounds normal; no masses,  no organomegaly ?Lymph:  no inguinal LAD noted ?Skin:  many keratoses noted ? ?Pelvic: External genitalia:  no lesions but several sebaceous cysts present ?             Urethra:  normal appearing urethra with no masses, tenderness or lesions ?             Bartholins and Skenes: normal    ?             Vagina: normal appearing vagina with normal color and discharge, no lesions ?             Cervix: absent ?             Bimanual Exam:  Uterus:  uterus absent ?             Adnexa: no mass, fullness, tenderness ?             ?Chaperone, Octaviano Batty, CMA, was present for exam. ? ?Assessment/Plan: ?1. Groin pain,  right ?- exam is normal today.  No abnormalities noted.  As has undergone pelvic MRI with evaluation, do not have any other suggestions for evaluation other than that this may be related to her hip osteoarthritis. ?

## 2022-01-22 LAB — URINE CULTURE

## 2022-01-23 ENCOUNTER — Encounter (HOSPITAL_BASED_OUTPATIENT_CLINIC_OR_DEPARTMENT_OTHER): Payer: Self-pay | Admitting: Nurse Practitioner

## 2022-01-23 ENCOUNTER — Telehealth (HOSPITAL_BASED_OUTPATIENT_CLINIC_OR_DEPARTMENT_OTHER): Payer: Self-pay

## 2022-01-23 ENCOUNTER — Ambulatory Visit (INDEPENDENT_AMBULATORY_CARE_PROVIDER_SITE_OTHER): Payer: Medicare PPO | Admitting: Nurse Practitioner

## 2022-01-23 DIAGNOSIS — N3090 Cystitis, unspecified without hematuria: Secondary | ICD-10-CM

## 2022-01-23 MED ORDER — FLUCONAZOLE 150 MG PO TABS: ORAL_TABLET | ORAL | 2 refills | Status: DC

## 2022-01-23 MED ORDER — CEFUROXIME AXETIL 500 MG PO TABS: 500.0000 mg | ORAL_TABLET | Freq: Two times a day (BID) | ORAL | 0 refills | Status: DC

## 2022-01-23 NOTE — Progress Notes (Signed)
Recent diagnosis of UTI with Dr. Sabra Heck.  Unfortunately she has been unable to get in touch with the office to get medication.  She is requesting medication today.  We will send treatment for patient.  She is aware to follow-up if symptoms worsen or fail to improve.

## 2022-01-25 ENCOUNTER — Ambulatory Visit (HOSPITAL_BASED_OUTPATIENT_CLINIC_OR_DEPARTMENT_OTHER): Payer: Medicare PPO | Admitting: Cardiology

## 2022-01-25 ENCOUNTER — Encounter (HOSPITAL_BASED_OUTPATIENT_CLINIC_OR_DEPARTMENT_OTHER): Payer: Self-pay | Admitting: Cardiology

## 2022-01-25 VITALS — BP 170/94 | HR 63 | Ht 67.0 in | Wt 318.2 lb

## 2022-01-25 DIAGNOSIS — I10 Essential (primary) hypertension: Secondary | ICD-10-CM | POA: Diagnosis not present

## 2022-01-25 DIAGNOSIS — R600 Localized edema: Secondary | ICD-10-CM

## 2022-01-25 DIAGNOSIS — Z86711 Personal history of pulmonary embolism: Secondary | ICD-10-CM | POA: Diagnosis not present

## 2022-01-25 DIAGNOSIS — Z7189 Other specified counseling: Secondary | ICD-10-CM

## 2022-01-25 MED ORDER — FUROSEMIDE 20 MG PO TABS: 20.0000 mg | ORAL_TABLET | Freq: Every day | ORAL | 3 refills | Status: DC

## 2022-01-25 MED ORDER — VALSARTAN 160 MG PO TABS: 160.0000 mg | ORAL_TABLET | Freq: Every day | ORAL | 3 refills | Status: DC

## 2022-01-25 NOTE — Patient Instructions (Signed)
Medication Instructions:  STOP: Valsartan-Hydrochlorothiazide  START: Valsartan 160 mg daily INCREASE: Lasix 20 mg daily  *If you need a refill on your cardiac medications before your next appointment, please call your pharmacy*   Lab Work: Your provider has recommended lab work today (BMP). Please have this collected at [facility] at [place]. The lab is open 8:00 am - 4:30 pm. Please avoid 12:00p - 1:00p for lunch hour. You do not need an appointment. Please go to [address] [place], [place] [phone]. This is in the Primary Care office on the 3rd floor, let them know you are there for blood work and they will direct you to the lab.  If you have labs (blood work) drawn today and your tests are completely normal, you will receive your results only by: [place] (if you have MyChart) OR A paper copy in the mail If you have any lab test that is abnormal or we need to change your treatment, we will call you to review the results.   Testing/Procedures: None ordered today   Follow-Up: At [facility], you and your health needs are our priority.  As part of our continuing mission to provide you with exceptional heart care, we have created designated Provider Care Teams.  These Care Teams include your primary Cardiologist (physician) and Advanced Practice Providers (APPs -  Physician Assistants and Nurse Practitioners) who all work together to provide you with the care you need, when you need it.  We recommend signing up for the patient portal called "MyChart".  Sign up information is provided on this After Visit Summary.  MyChart is used to connect with patients for Virtual Visits (Telemedicine).  Patients are able to view lab/test results, encounter notes, upcoming appointments, etc.  Non-urgent messages can be sent to your provider as well.   To learn more about what you can do with MyChart, go to [email or web address].    Your next appointment:   3  week(s)  The format for your next appointment:   In Person  Provider:   Buford Dresser, MD{or Laurann Montana, NP

## 2022-01-25 NOTE — Progress Notes (Signed)
Cardiology Office Note:    Date:  01/25/2022   ID:  [name], DOB 1950/10/28, MRN [id number]  PCP:  Orma Render, NP  Cardiologist:  Buford Dresser, MD  Referring MD: Orma Render, NP   CC: follow up  History of Present Illness:    Natasha [name] is a 71 y.o. female with a hx of Covid-19 infection in 08/2019, pulmonary embolism, hypertension, hyperthyroidism who is seen for follow up. I initially saw her 11/17/19 as a new consult at the request of Lucianne Lei, MD for the evaluation and management of post-Covid/post-PE management and bradycardia.  History: She required hospitalization for Covid pneumonia from 08/21/19-09/06/19. She received steroids and remdesivir. During her admission, she was diagnosed with submassive PE and started on rivaroxaban. She was recommended for cardiology post discharge follow up for bradycardia and monitoring of RV function.  She was seen by Dr. Radford Pax during her admission, note dated 08/23/19. Noted to have sinus bradycardia in the 40s and 50s.   Last visit, patient was experiencing tender "squeezing" chest pain that improved with massaging. Patient also complained of constant liver pain that improved after eating, which she attributed to taking too much Advil. Her reflux medication was helpful. She reported bruising from injections for pain management.  Today: She is feeling a little down today due to her recent UTI and elevated blood pressures (170/94 in clinic today). Last Wednesday she saw Dr. Sabra Heck and received an antibiotic for UTI. However she is still not feeling well. She has been suffering from chills, cough, and sore throat. Recently she measured her temperature at home as 95.7 degrees. Her recent at home COVID test was negative.  Her swelling has been worse in the past week and she has noticed gaining some weight (318 lbs today; she reports being 303 lbs when she left [place]). As instructed by her PCP she tried furosemide for 3  days and her swelling improved. It returned when she stopped furosemide, but she has not resumed this until she was able to discuss it today.  She is asking about weight loss. We discussed GLP1RA today, which I am generally in favor of. However, she has medicare and does not have a diabetes diagnosis--we discussed that these medications are typically not covered for weight loss alone by medicare.  She denies any palpitations, or chest pain. No lightheadedness, headaches, syncope, orthopnea, or PND.    Past Medical History:  Diagnosis Date   Abdominal spasms 12/14/2020   Acute pulmonary embolism ([place]) 08/21/2019   Bacterial conjunctivitis of both eyes 12/14/2020   Bilateral leg edema 02/10/2020   Bradycardia    Breast cancer screening by mammogram 11/24/2020   CARCINOMA, THYROID GLAND, PAPILLARY 05/04/2008   Stage 2, 8/09: thyroidectomy for 2.7cm papillary adenocarcinoma (t2 n0 mo) 9/09: I-131 rx, 108 [facility] 05/10: tg is neg (ab neg) , total body scan is neg   Colon cancer screening 11/24/2020   Colon polyps    Complication of anesthesia    trouble with airway   Cough 12/25/2007   Qualifier: Diagnosis of  By: Doy Mince LPN, Megan     IONGE-95 08/19/2019   Diverticulosis    DVT (deep venous thrombosis) ([place])    Edema 12/22/2008   Encounter to establish care 11/24/2020   History of 2019 novel coronavirus disease (COVID-19) 11/09/2019   HYPERTENSION 12/25/2007   HYPOTHYROIDISM, POSTSURGICAL 05/04/2008   LEG PAIN, LEFT 12/09/2008   Lumbago with sciatica, right side 01/08/2021   Memory loss due to  medical condition 11/09/2019   Muscle weakness    Nausea and vomiting 05/23/2008   Qualifier: Diagnosis of  By: Loanne Drilling MD, Roena Malady of this note might be different from the original. Qualifier: Diagnosis of  By: Loanne Drilling MD, Sean A   Numbness and tingling    OSA (obstructive sleep apnea) 06/15/2013   CPAP   Paresthesia 01/04/2020   Peripheral edema 12/22/2008   Qualifier: Diagnosis of  By: Johnsie Cancel, MD,  Rona Ravens    Pneumonia due to COVID-19 virus    Pulmonary embolism [facility])    Pulmonary embolism ([place]) 08/23/2019   Right lower quadrant pain 11/24/2020   Sciatica of left side 12/04/2011   Skin sensation disturbance 12/05/2008   Qualifier: Diagnosis of  By: Asa Lente MD, Serita Grit of this note might be different from the original. Qualifier: Diagnosis of  By: Asa Lente MD, Mateo Flow A   Spasm of muscle of lower back 12/14/2020   Vaginal candidiasis 03/16/2021   Weakness 01/04/2020    Past Surgical History:  Procedure Laterality Date   ABDOMINAL HYSTERECTOMY     due to bleeding, ovaries remain   ANKLE SURGERY Right    CERVICAL FUSION     x 2   COLONOSCOPY WITH PROPOFOL N/A 08/08/2015   Procedure: COLONOSCOPY WITH PROPOFOL;  Surgeon: Juanita Craver, MD;  Location: [facility];  Service: Endoscopy;  Laterality: N/A;   KNEE SURGERY Left    TOTAL THYROIDECTOMY      Current Medications: Current Outpatient Medications on File Prior to Visit  Medication Sig   amLODipine (NORVASC) 5 MG tablet Take 1 tablet (5 mg total) by mouth at bedtime. For Blood Pressure   cefUROXime (CEFTIN) 500 MG tablet Take 1 tablet (500 mg total) by mouth 2 (two) times daily with a meal.   celecoxib (CELEBREX) 100 MG capsule Take 1 capsule (100 mg total) by mouth 2 (two) times daily.   fluconazole (DIFLUCAN) 150 MG tablet Take one tablet by mouth at the first sign of symptoms of yeast. If no resolution, repeat dose in 72 hours.   GAVILYTE-G 236 g solution SMARTSIG:400 Milliliter(s) By Mouth As Directed   Krill Oil 1000 MG CAPS Take 1,000 mg by mouth 2 (two) times daily.   montelukast (SINGULAIR) 10 MG tablet Take 1 tablet (10 mg total) by mouth at bedtime.   Multiple Vitamin (MULTIVITAMIN WITH MINERALS) TABS tablet Take 1 tablet by mouth daily.   pantoprazole (PROTONIX) 40 MG tablet Take 1 tablet (40 mg total) by mouth 2 (two) times daily.   rivaroxaban (XARELTO) 20 MG TABS tablet Take 1 tablet (20 mg  total) by mouth daily with supper.   SYNTHROID 150 MCG tablet Take 169mg by mouth 5 days a week and 744m by mouth 2 days a week.   Trospium Chloride 60 MG CP24 Take 1 capsule by mouth daily at 12 noon.   Vitamin D, Ergocalciferol, (DRISDOL) 1.25 MG (50000 UNIT) CAPS capsule Take 1 capsule (50,000 Units total) by mouth every 7 (seven) days. Take for 8 total doses(weeks)   No current facility-administered medications on file prior to visit.     Allergies:   Codeine and Eggs or egg-derived products   Social History   Tobacco Use   Smoking status: Never   Smokeless tobacco: Never  Vaping Use   Vaping Use: Never used  Substance Use Topics   Alcohol use: Never    Comment: 1 every 1-2 months   Drug use: No  Family History: family history includes Heart disease in her mother; Kidney disease in her mother; Other in her father.  ROS:   Please see the history of present illness.    (+) Chills (+) Cough (+) Sore throat (+) LE edema (+) Weight gain  Additional pertinent ROS otherwise unremarkable.  EKGs/Labs/Other Studies Reviewed:    The following studies were reviewed today:  Echocardiogram 12/10/21:  IMPRESSIONS    1. Left ventricular ejection fraction, by estimation, is 55 to 60%. Left  ventricular ejection fraction by 3D volume is 59 %. The left ventricle has  normal function. The left ventricle has no regional wall motion  abnormalities. There is moderate  concentric left ventricular hypertrophy. Left ventricular diastolic  parameters were normal. The average left ventricular global longitudinal  strain is -16.8 %. The global longitudinal strain is normal.   2. Right ventricular systolic function is normal. The right ventricular  size is normal.   3. The mitral valve is normal in structure. No evidence of mitral valve  regurgitation.   4. The aortic valve is normal in structure. Aortic valve regurgitation is  not visualized. No aortic stenosis is present.    Comparison(s): EF 60%.   Echo 12/22/19  1. Left ventricular ejection fraction, by estimation, is 60 to 65%. The  left ventricle has normal function. The left ventricle has no regional  wall motion abnormalities. Left ventricular diastolic parameters are  indeterminate.   2. Right ventricular systolic function is normal. The right ventricular  size is not well visualized. Tricuspid regurgitation signal is inadequate  for assessing PA pressure.   3. The mitral valve is grossly normal. Trivial mitral valve  regurgitation. No evidence of mitral stenosis.   4. The aortic valve was not well visualized. Aortic valve regurgitation  is not visualized. No aortic stenosis is present.   5. The inferior vena cava is normal in size with greater than 50%  respiratory variability, suggesting right atrial pressure of 3 mmHg.   Comparison(s): No significant change from prior study.   Monitor 12/31/19 ~7 days of data recorded on Zio monitor. Patient had a min HR of 44 bpm, max HR of 174 bpm, and avg HR of 67 bpm. Predominant underlying rhythm was Sinus Rhythm. No VT, atrial fibrillation, high degree block, or pauses noted. Isolated atrial and ventricular ectopy was rare (<1%). There were 5 triggered events. These were sinus rhythm with/without PVC. There were 10 SVT events, the fastest interval lasting 7 beats with a max rate of 174 bpm, the longest 21 beats/9.3 secs with an avg rate of 131 bpm.  Echo 08/22/19 1. Left ventricular ejection fraction, by visual estimation, is 65 to  70%. The left ventricle has hyperdynamic function. There is no left  ventricular hypertrophy.   2. The left ventricle has no regional wall motion abnormalities.   3. Global right ventricle has low normal systolic function.The right  ventricular size is mildly enlarged. No increase in right ventricular wall  thickness.   4. Presence of pericardial fat pad.   5. The mitral valve is grossly normal. Trivial mitral valve   regurgitation.   6. The tricuspid valve is grossly normal. Tricuspid valve regurgitation  is not demonstrated.   7. The tricuspid valve was normal in structure. Tricuspid valve  regurgitation is not demonstrated.   8. The aortic valve is grossly normal. Aortic valve regurgitation is not  visualized. No evidence of aortic valve sclerosis or stenosis.   9. The inferior vena cava is dilated  in size with <50% respiratory  variability, suggesting right atrial pressure of 15 mmHg.  10. Technically challenging study. Normal LV function. RV appears to be  mildly enlarged, with borderline normal function. Unable to visualize PA  well.  11. The interatrial septum was not assessed.   EKG:  EKG is personally reviewed.    01/25/22: not ordered 04/30/2021: not ordered 01/18/2021: NSR at 66 bpm, PRWP 11/17/19: NSR  Recent Labs: 11/30/2021: ALT 14; BNP 20.2; BUN 16; Creatinine, Ser 0.86; Hemoglobin 12.4; Platelets 277; Potassium 4.2; Sodium 143; TSH 0.141  Recent Lipid Panel    Component Value Date/Time   CHOL 174 09/04/2019 1230   TRIG 89 09/04/2019 1230   HDL 76 09/04/2019 1230   CHOLHDL 2.3 09/04/2019 1230   VLDL 18 09/04/2019 1230   LDLCALC 80 09/04/2019 1230    Physical Exam:    VS:  BP (!) 170/94 (BP Location: Left Arm, Patient Position: Sitting, Cuff Size: Normal)   Pulse 63   Ht '5\' 7"'$  (1.702 m)   Wt (!) 318 lb 3.2 oz (144.3 kg)   SpO2 98%   BMI 49.84 kg/m     Wt Readings from Last 3 Encounters:  01/25/22 (!) 318 lb 3.2 oz (144.3 kg)  01/16/22 (!) 316 lb 12.8 oz (143.7 kg)  11/30/21 (!) 319 lb (144.7 kg)    GEN: Well nourished, well developed in no acute distress HEENT: Normal, moist mucous membranes NECK: No JVD appreciated but difficult body habitus CARDIAC: regular rhythm, normal S1 and S2, no rubs or gallops. No murmur. VASCULAR: Radial and DP pulses 2+ bilaterally. No carotid bruits RESPIRATORY:  Clear to auscultation without rales, wheezing or rhonchi  ABDOMEN: Soft,  non-tender, non-distended MUSCULOSKELETAL:  Ambulates independently SKIN: Warm and dry, 2+ bilateral pitting edema to knees NEUROLOGIC:  Alert and oriented x 3. No focal neuro deficits noted. PSYCHIATRIC:  Normal affect   ASSESSMENT:    1. Bilateral leg edema   2. Primary hypertension   3. Essential hypertension   4. Morbid obesity ([place])   5. History of pulmonary embolism   6. Counseling on health promotion and disease prevention      PLAN:    Weight gain LE edema History of Covid pneumonia and pulmonary embolism -reviewed recent echo, no evidence of right or left sided heart failure. Suspect that both Covid and PE no longer significantly affecting the heart -counseled on need to continue using CPAP for OSA -counseled on daily weights, salt avoidance -counseled on compression stockings and leg elevation. With normal right atrial pressure, suspect component of chronic venous insufficiency -we discussed intravascular vs extravascular fluid retention. We will change her furosemide to daily instead of PRN and stop HCTZ given this. However, counseled that if this is primarily venous insufficiency that too much diuretic may damage kidneys and not improve swelling.  -weight not significantly different on our scale but reports significant weight gain at home -counseled on red flag warning signs that need immediate medical attention -remains on rivaroxaban, likely lifelong -BMET today and at follow up visit in several weeks  Hypertension: elevated today -changing to daily furosemide and stopping HCTZ as above -continued amlodipine. Do not suspect that 5 mg dose is contributing significantly to LE edema -continue valsartan -if remains elevated, may need to add additional agent  Morbid obesity:  -we discussed GLP1RA today. Has previously been run by our PharmD, cost prohibitive on her medicare plan. She does have a history of papillary thyroid cancer (not medullary).  Cardiac risk  counseling and prevention recommendations: -recommend heart healthy/Mediterranean diet, with whole grains, fruits, vegetable, fish, lean meats, nuts, and olive oil. Limit salt. -recommend moderate walking, 3-5 times/week for 30-50 minutes each session. Aim for at least 150 minutes.week. Goal should be pace of 3 miles/hours, or walking 1.5 miles in 30 minutes. This is a long term goal. -recommend avoidance of tobacco products. Avoid excess alcohol. -ASCVD risk score: she denies diabetes (A1c 5.8), so this is an overestimate The 10-year ASCVD risk score (Arnett DK, et al., 2019) is: 63.6%   Values used to calculate the score:     Age: 31 years     Sex: Female     Is Non-Hispanic African American: Yes     Diabetic: Yes     Tobacco smoker: Yes     Systolic Blood Pressure: 474 mmHg     Is BP treated: Yes     HDL Cholesterol: 76 mg/dL     Total Cholesterol: 174 mg/dL    Plan for follow up: 3 weeks or sooner as needed  Buford Dresser, MD, PhD [place]  [facility]   Medication Adjustments/Labs and Tests Ordered: Current medicines are reviewed at length with the patient today.  Concerns regarding medicines are outlined above.  Orders Placed This Encounter  Procedures   Basic metabolic panel    Meds ordered this encounter  Medications   furosemide (LASIX) 20 MG tablet    Sig: Take 1 tablet (20 mg total) by mouth daily.    Dispense:  90 tablet    Refill:  3   valsartan (DIOVAN) 160 MG tablet    Sig: Take 1 tablet (160 mg total) by mouth daily.    Dispense:  90 tablet    Refill:  3    Replaces valsartan-HCTZ     Patient Instructions  Medication Instructions:  STOP: Valsartan-Hydrochlorothiazide  START: Valsartan 160 mg daily INCREASE: Lasix 20 mg daily  *If you need a refill on your cardiac medications before your next appointment, please call your pharmacy*   Lab Work: Your provider has recommended lab work today (BMP). Please have this collected at [facility] [place]. The lab is open 8:00 am - 4:30 pm. Please avoid 12:00p - 1:00p for lunch hour. You do not need an appointment. Please go to [address] [place], [place] [address]. This is in the Primary Care office on the 3rd floor, let them know you are there for blood work and they will direct you to the lab.  If you have labs (blood work) drawn today and your tests are completely normal, you will receive your results only by: [place] (if you have MyChart) OR A paper copy in the mail If you have any lab test that is abnormal or we need to change your treatment, we will call you to review the results.   Testing/Procedures: None ordered today   Follow-Up: At [facility], you and your health needs are our priority.  As part of our continuing mission to provide you with exceptional heart care, we have created designated Provider Care Teams.  These Care Teams include your primary Cardiologist (physician) and Advanced Practice Providers (APPs -  Physician Assistants and Nurse Practitioners) who all work together to provide you with the care you need, when you need it.  We recommend signing up for the patient portal called "MyChart".  Sign up information is provided on this After Visit Summary.  MyChart is used to connect with patients for  Virtual Visits (Telemedicine).  Patients are able to view lab/test results, encounter notes, upcoming appointments, etc.  Non-urgent messages can be sent to your provider as well.   To learn more about what you can do with MyChart, go to [email or web address].    Your next appointment:   3 week(s)  The format for your next appointment:   In Person  Provider:   Buford Dresser, MD{or Laurann Montana, NP          I,Mathew Stumpf,acting as a scribe for Buford Dresser, MD.,have documented all relevant documentation on the behalf of Buford Dresser, MD,as directed by  Buford Dresser, MD while  in the presence of Buford Dresser, MD.  I, Buford Dresser, MD, have reviewed all documentation for this visit. The documentation on 01/25/22 for the exam, diagnosis, procedures, and orders are all accurate and complete.  Signed, Buford Dresser, MD PhD 01/25/2022  [place]

## 2022-01-26 LAB — BASIC METABOLIC PANEL
BUN/Creatinine Ratio: 15 (ref 12–28)
BUN: 13 mg/dL (ref 8–27)
CO2: 21 mmol/L (ref 20–29)
Calcium: 9.7 mg/dL (ref 8.7–10.3)
Chloride: 107 mmol/L — ABNORMAL HIGH (ref 96–106)
Creatinine, Ser: 0.88 mg/dL (ref 0.57–1.00)
Glucose: 97 mg/dL (ref 70–99)
Potassium: 3.9 mmol/L (ref 3.5–5.2)
Sodium: 143 mmol/L (ref 134–144)
eGFR: 70 mL/min/{1.73_m2} (ref >59)

## 2022-01-31 ENCOUNTER — Ambulatory Visit (HOSPITAL_BASED_OUTPATIENT_CLINIC_OR_DEPARTMENT_OTHER): Payer: Medicare PPO | Admitting: Nurse Practitioner

## 2022-01-31 ENCOUNTER — Encounter (HOSPITAL_BASED_OUTPATIENT_CLINIC_OR_DEPARTMENT_OTHER): Payer: Self-pay

## 2022-01-31 NOTE — Telephone Encounter (Signed)
Entered in error

## 2022-02-15 ENCOUNTER — Other Ambulatory Visit (HOSPITAL_BASED_OUTPATIENT_CLINIC_OR_DEPARTMENT_OTHER): Payer: Self-pay | Admitting: Nurse Practitioner

## 2022-02-15 ENCOUNTER — Ambulatory Visit (HOSPITAL_BASED_OUTPATIENT_CLINIC_OR_DEPARTMENT_OTHER): Payer: Medicare PPO | Admitting: Family

## 2022-02-15 DIAGNOSIS — E559 Vitamin D deficiency, unspecified: Secondary | ICD-10-CM

## 2022-02-15 DIAGNOSIS — M6281 Muscle weakness (generalized): Secondary | ICD-10-CM

## 2022-02-15 NOTE — Progress Notes (Deleted)
Office Visit    Patient Name: Natasha [name] Date of Encounter: 02/15/2022  PCP:  Orma Render, NP   [place]  Cardiologist:  Buford Dresser, MD  Advanced Practice Provider:  No care team member to display Electrophysiologist:  None      Chief Complaint    Natasha [name] is a 71 y.o. female with a hx of PE, OSA, hypertension, hypothyroidism, COVID-19 requiring hospitalization 08/2019 presents today for ***  Past Medical History    Past Medical History:  Diagnosis Date   Abdominal spasms 12/14/2020   Acute pulmonary embolism ([place]) 08/21/2019   Bacterial conjunctivitis of both eyes 12/14/2020   Bilateral leg edema 02/10/2020   Bradycardia    Breast cancer screening by mammogram 11/24/2020   CARCINOMA, THYROID GLAND, PAPILLARY 05/04/2008   Stage 2, 8/09: thyroidectomy for 2.7cm papillary adenocarcinoma (t2 n0 mo) 9/09: I-131 rx, 108 mci 05/10: tg is neg (ab neg) , total body scan is neg   Colon cancer screening 11/24/2020   Colon polyps    Complication of anesthesia    trouble with airway   Cough 12/25/2007   Qualifier: Diagnosis of  By: Doy Mince LPN, Megan     PJASN-05 08/19/2019   Diverticulosis    DVT (deep venous thrombosis) ([place])    Edema 12/22/2008   Encounter to establish care 11/24/2020   History of 2019 novel coronavirus disease (COVID-19) 11/09/2019   HYPERTENSION 12/25/2007   HYPOTHYROIDISM, POSTSURGICAL 05/04/2008   LEG PAIN, LEFT 12/09/2008   Lumbago with sciatica, right side 01/08/2021   Memory loss due to medical condition 11/09/2019   Muscle weakness    Nausea and vomiting 05/23/2008   Qualifier: Diagnosis of  By: Loanne Drilling MD, Jacelyn Pi   Formatting of this note might be different from the original. Qualifier: Diagnosis of  By: Loanne Drilling MD, Sean A   Numbness and tingling    OSA (obstructive sleep apnea) 06/15/2013   CPAP   Paresthesia 01/04/2020   Peripheral edema 12/22/2008   Qualifier: Diagnosis of  By: Johnsie Cancel, MD, Rona Ravens     Pneumonia due to COVID-19 virus    Pulmonary embolism ([place])    Pulmonary embolism ([place]) 08/23/2019   Right lower quadrant pain 11/24/2020   Sciatica of left side 12/04/2011   Skin sensation disturbance 12/05/2008   Qualifier: Diagnosis of  By: Asa Lente MD, Serita Grit of this note might be different from the original. Qualifier: Diagnosis of  By: Asa Lente MD, Mateo Flow A   Spasm of muscle of lower back 12/14/2020   Vaginal candidiasis 03/16/2021   Weakness 01/04/2020   Past Surgical History:  Procedure Laterality Date   ABDOMINAL HYSTERECTOMY     due to bleeding, ovaries remain   ANKLE SURGERY Right    CERVICAL FUSION     x 2   COLONOSCOPY WITH PROPOFOL N/A 08/08/2015   Procedure: COLONOSCOPY WITH PROPOFOL;  Surgeon: Juanita Craver, MD;  Location: [facility];  Service: Endoscopy;  Laterality: N/A;   KNEE SURGERY Left    TOTAL THYROIDECTOMY      Allergies  Allergies  Allergen Reactions   Codeine Shortness Of Breath, Palpitations and Other (See Comments)   Eggs Or Egg-Derived Products Nausea And Vomiting    PROJECTILE VOMITING    History of Present Illness    Natasha [name] is a 71 y.o. female with a hx of COVID-19 08/2019 requiring hospitalization, PE, hypertension, hypothyroidism, thyroid cancer, OSA last seen *** by Dr. Harrell Gave.  She was hospitalized 08/21/2019 - 09/06/2019 due to COVID-19 pneumonia and received steroids and remdesivir.  She was diagnosed with submassive PE and started on Xarelto during admission.  Seen in consult by Dr. Radford Pax noted to have sinus bradycardia in the 40s and 50s.  She has since followed with Dr. Harrell Gave.  She had evidence of right heart strain at time of PE.  Repeat echocardiogram 12/2019 with LVEF 60 to 65%, indeterminate diastolic parameters, trivial MR.  Cardiac monitor 12/2019 with minimum heart rate 44, max heart rate 174, average heart rate 67 bpm.  Predominantly normal sinus rhythm.  Her 5 triggered events were associated with sinus  rhythm with or without PVC.  There were 10 SVT events the longest lasting 9.3 seconds.  She was seen in clinic 04/30/2021.  She was encouraged to continue using her CPAP, daily weights, salt avoidance.  Her Lasix discontinued for lower extremity edema and component of venous insufficiency was suspected.  ED visit January 2023 in [place] after presenting with 3 to history of fever, shortness of breath, nausea, vomiting, dysuria, urgency, frequency.  Urine was positive for Klebsiella and was treated with ABX.  CXR with "tortuous thoracic aorta, borderline cardiomegaly, scarring in the left upper lung zone, no focal infiltrate".   She was seen in follow-up 10/2021 continuing to follow with urology related to UTI.  She noted exertional dyspnea as well as lower extremity edema.  She was recommended to take her as needed Lasix daily for 3 days.  Updated echocardiogram ordered and performed 12/10/2021 normal LVEF, no RWMA, moderate LVH, normal diastolic parameters, no significant valvular abnormalities.  She was seen in follow-up 01/25/2022 by Dr. Harrell Gave.  Due to lower extremity edema Lasix was transition to daily and hydrochlorothiazide discontinued.  Her edema is likely related to venous insufficiency.  No significant weight gain by scale in clinic.  She presents today for follow-up. *** ***  She presents today for follow-up.Tells me every January she spends two months with her son who is a Tax adviser Dietitian) in [place]. She had an episode walking on the treadmill and begin to feel "like I was fading". She was told she had a "nodule on her heart", klebsiella UTI, as well as fluid on her lungs. She was treated with antibiotics. Just recently returned from [place]. She saw urology yesterday and urine clear but they sent it for culture as she is having bladder and lower back pain which has recurred since antibiotics. She notes for the last 3-4 days she has been having a "tenderness" in her chest at  night while sitting still. She is frustrated and shares with me that she "wants her health back". She notes lower extremity edema, weight gain. Shares with me that she has not taken her PRN Lasix . She has been skipping days of medication as frustrated by how many pills she has to take.  Some improvement since increased dose of Levothyroxine.  EKGs/Labs/Other Studies Reviewed:   The following studies were reviewed today:   Echo 12/22/19  1. Left ventricular ejection fraction, by estimation, is 60 to 65%. The  left ventricle has normal function. The left ventricle has no regional  wall motion abnormalities. Left ventricular diastolic parameters are  indeterminate.   2. Right ventricular systolic function is normal. The right ventricular  size is not well visualized. Tricuspid regurgitation signal is inadequate  for assessing PA pressure.   3. The mitral valve is grossly normal. Trivial mitral valve  regurgitation. No evidence of mitral  stenosis.   4. The aortic valve was not well visualized. Aortic valve regurgitation  is not visualized. No aortic stenosis is present.   5. The inferior vena cava is normal in size with greater than 50%  respiratory variability, suggesting right atrial pressure of 3 mmHg.   Comparison(s): No significant change from prior study.    Monitor 12/31/19 ~7 days of data recorded on Zio monitor. Patient had a min HR of 44 bpm, max HR of 174 bpm, and avg HR of 67 bpm. Predominant underlying rhythm was Sinus Rhythm. No VT, atrial fibrillation, high degree block, or pauses noted. Isolated atrial and ventricular ectopy was rare (<1%). There were 5 triggered events. These were sinus rhythm with/without PVC. There were 10 SVT events, the fastest interval lasting 7 beats with a max rate of 174 bpm, the longest 21 beats/9.3 secs with an avg rate of 131 bpm.   Echo 08/22/19 1. Left ventricular ejection fraction, by visual estimation, is 65 to  70%. The left ventricle has  hyperdynamic function. There is no left  ventricular hypertrophy.   2. The left ventricle has no regional wall motion abnormalities.   3. Global right ventricle has low normal systolic function.The right  ventricular size is mildly enlarged. No increase in right ventricular wall  thickness.   4. Presence of pericardial fat pad.   5. The mitral valve is grossly normal. Trivial mitral valve  regurgitation.   6. The tricuspid valve is grossly normal. Tricuspid valve regurgitation  is not demonstrated.   7. The tricuspid valve was normal in structure. Tricuspid valve  regurgitation is not demonstrated.   8. The aortic valve is grossly normal. Aortic valve regurgitation is not  visualized. No evidence of aortic valve sclerosis or stenosis.   9. The inferior vena cava is dilated in size with <50% respiratory  variability, suggesting right atrial pressure of 15 mmHg.  10. Technically challenging study. Normal LV function. RV appears to be  mildly enlarged, with borderline normal function. Unable to visualize PA  well.  11. The interatrial septum was not assessed.     EKG:  EKG is ordered today.  The ekg ordered today demonstrates NSR 62 bpm with first degree AV block PR 226. No acute ST/T wave changes. ***  Recent Labs: 11/30/2021: ALT 14; BNP 20.2; Hemoglobin 12.4; Platelets 277; TSH 0.141 01/25/2022: BUN 13; Creatinine, Ser 0.88; Potassium 3.9; Sodium 143  Recent Lipid Panel    Component Value Date/Time   CHOL 174 09/04/2019 1230   TRIG 89 09/04/2019 1230   HDL 76 09/04/2019 1230   CHOLHDL 2.3 09/04/2019 1230   VLDL 18 09/04/2019 1230   LDLCALC 80 09/04/2019 1230     Home Medications   No outpatient medications have been marked as taking for the 02/15/22 encounter (Appointment) with Loel Dubonnet, NP.     Review of Systems      All other systems reviewed and are otherwise negative except as noted above.  Physical Exam    VS:  There were no vitals taken for this visit. ,  BMI There is no height or weight on file to calculate BMI.  Wt Readings from Last 3 Encounters:  01/25/22 (!) 318 lb 3.2 oz (144.3 kg)  01/16/22 (!) 316 lb 12.8 oz (143.7 kg)  11/30/21 (!) 319 lb (144.7 kg)    *** GEN: Well nourished, overweight, well developed, in no acute distress. HEENT: normal. Neck: Supple, no JVD, carotid bruits, or masses. Cardiac: RRR, no murmurs,  rubs, or gallops. No clubbing, cyanosis. Non pitting bilateral pretibial edema..  Radials/PT 2+ and equal bilaterally.  Respiratory:  Respirations regular and unlabored, clear to auscultation bilaterally. GI: Soft, nontender, nondistended. MS: No deformity or atrophy. Skin: Warm and dry, no rash. Neuro:  Strength and sensation are intact. Psych: Normal affect.  Assessment & Plan    Exertional dyspnea/lower extremity edema -reports urgent care visit in [place] at which time they told her she had a "nodule in her heart ".  We discussed that this is uncommon and may be a lung nodule.  We will request records from urgent care.  She reports exertional dyspnea as well as lower extremity edema.  Encouraged to take her as needed Lasix daily for 3 days.  We will update BNP to assess fluid volume status, CMP to assess kidney and liver function, CBC to rule out anemia.  We will update an echocardiogram to rule out new onset heart failure.  Anticipate some of her symptoms are related to deconditioning, obesity.***  Recent UTI -treated with ABX.  Continue to follow with urology***  History of COVID-pneumonia and PE with evidence of right-sided heart failure at time of diagnosis 08/2019 -LVEF recovered.  To remain on long-term anticoagulation.  From complications.  Update CBC.***  Hypertension - BP elevated in clinic today.  Endorses not taking medications regularly we discussed the importance of regular medical therapy.  She is agreeable to take daily as prescribed and monitor her blood pressure.***  Morbid obesity - Weight  loss via diet and exercise encouraged. Discussed the impact being overweight would have on cardiovascular risk. 03/2021 A1c 5.1. Unfortunately [employer] does not cover [employer] at this time.  Consider referral to prep exercise program at follow-up.  Update A1c today.***  OSA - CPAP compliance encouraged. ***  Disposition: Follow up  6 to 8 weeks  **with Buford Dresser, MD or APP.  Signed, Loel Dubonnet, NP 02/15/2022, 11:24 AM [place]

## 2022-02-16 IMAGING — CR DG CHEST 2V
2 series · 2 of 2 positions shown · non-contrast
Comparison: September 02, 2019

CLINICAL DATA: Recent HBA8D-OC positive.  Shortness of breath

EXAM:
CHEST - 2 VIEW

[w chest pa]
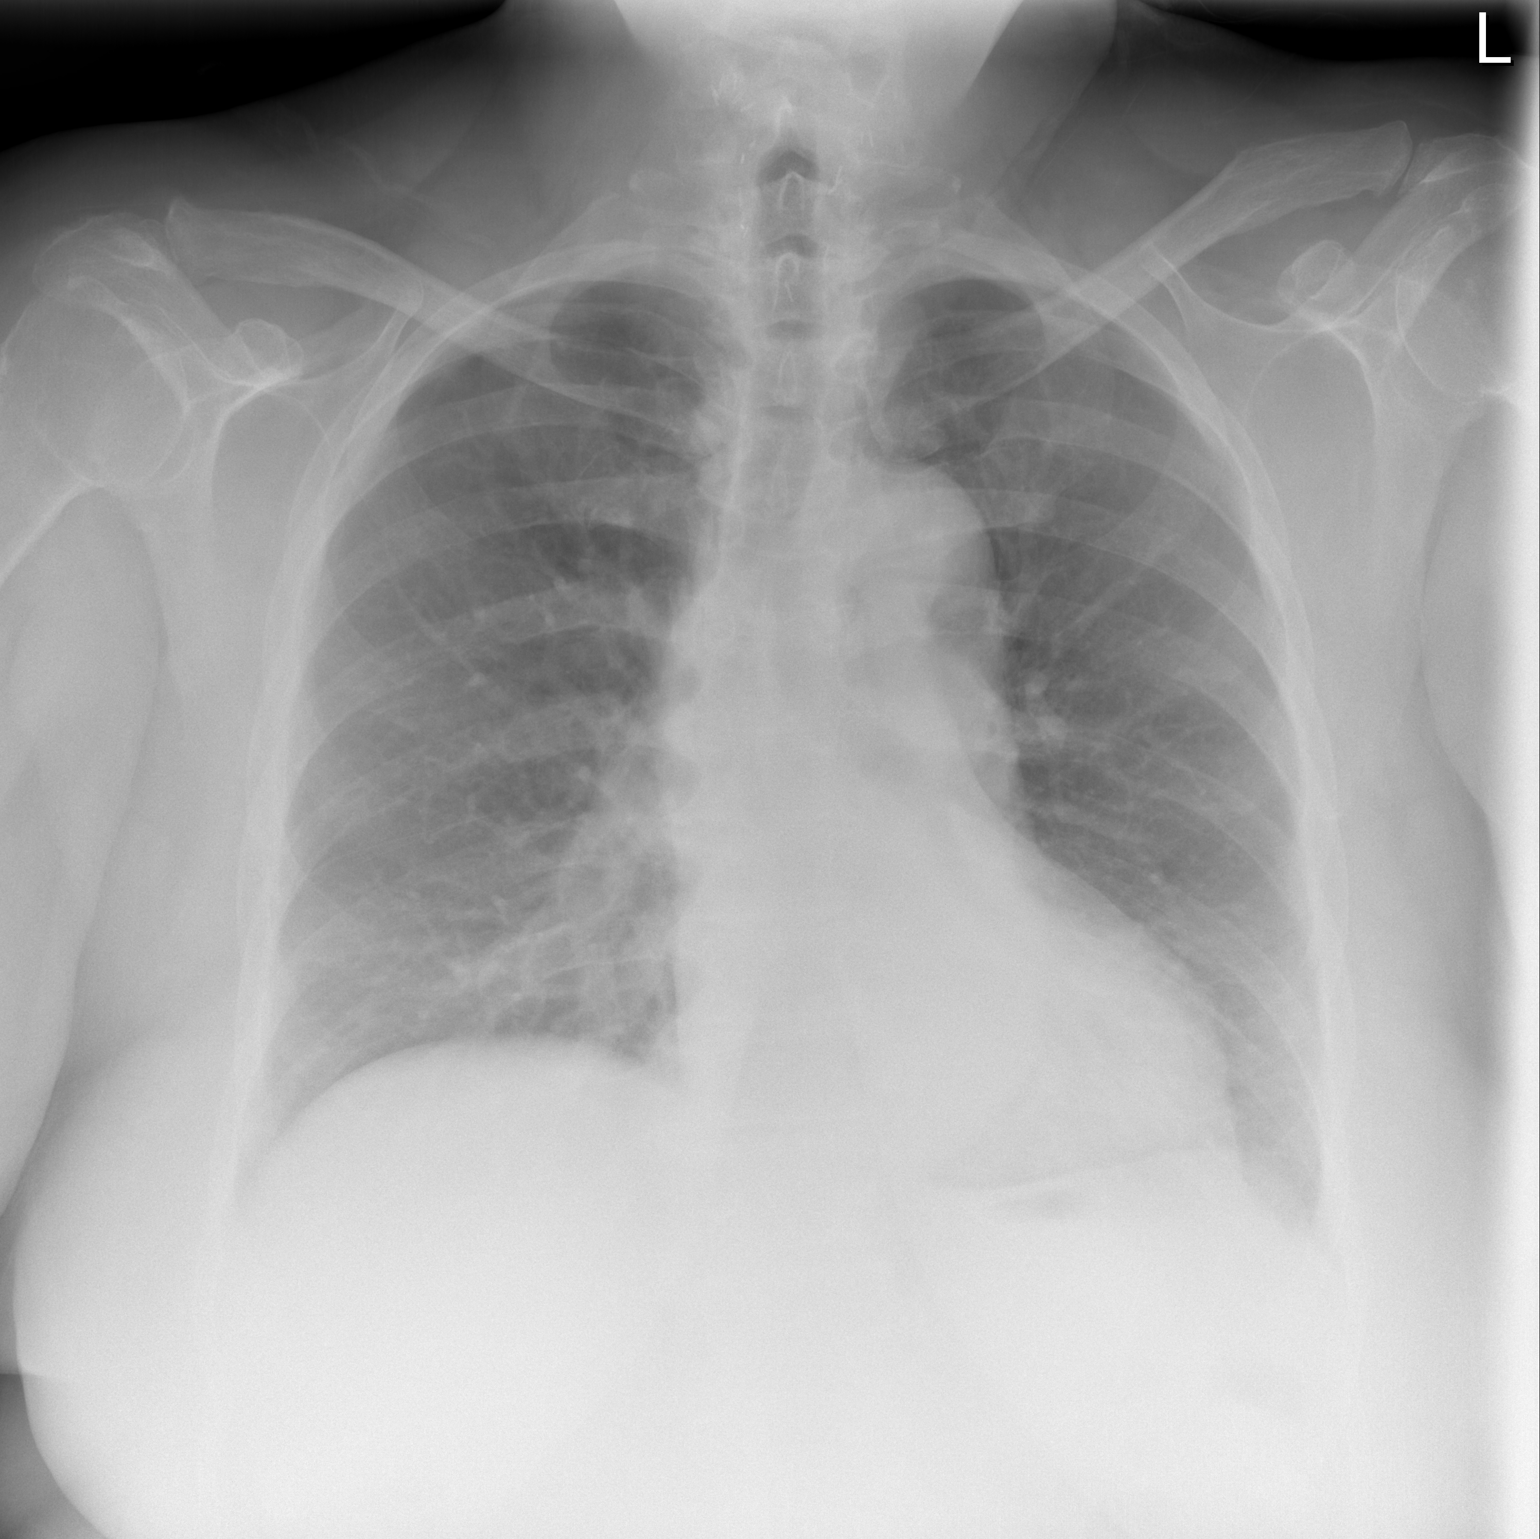

[w chest lat]
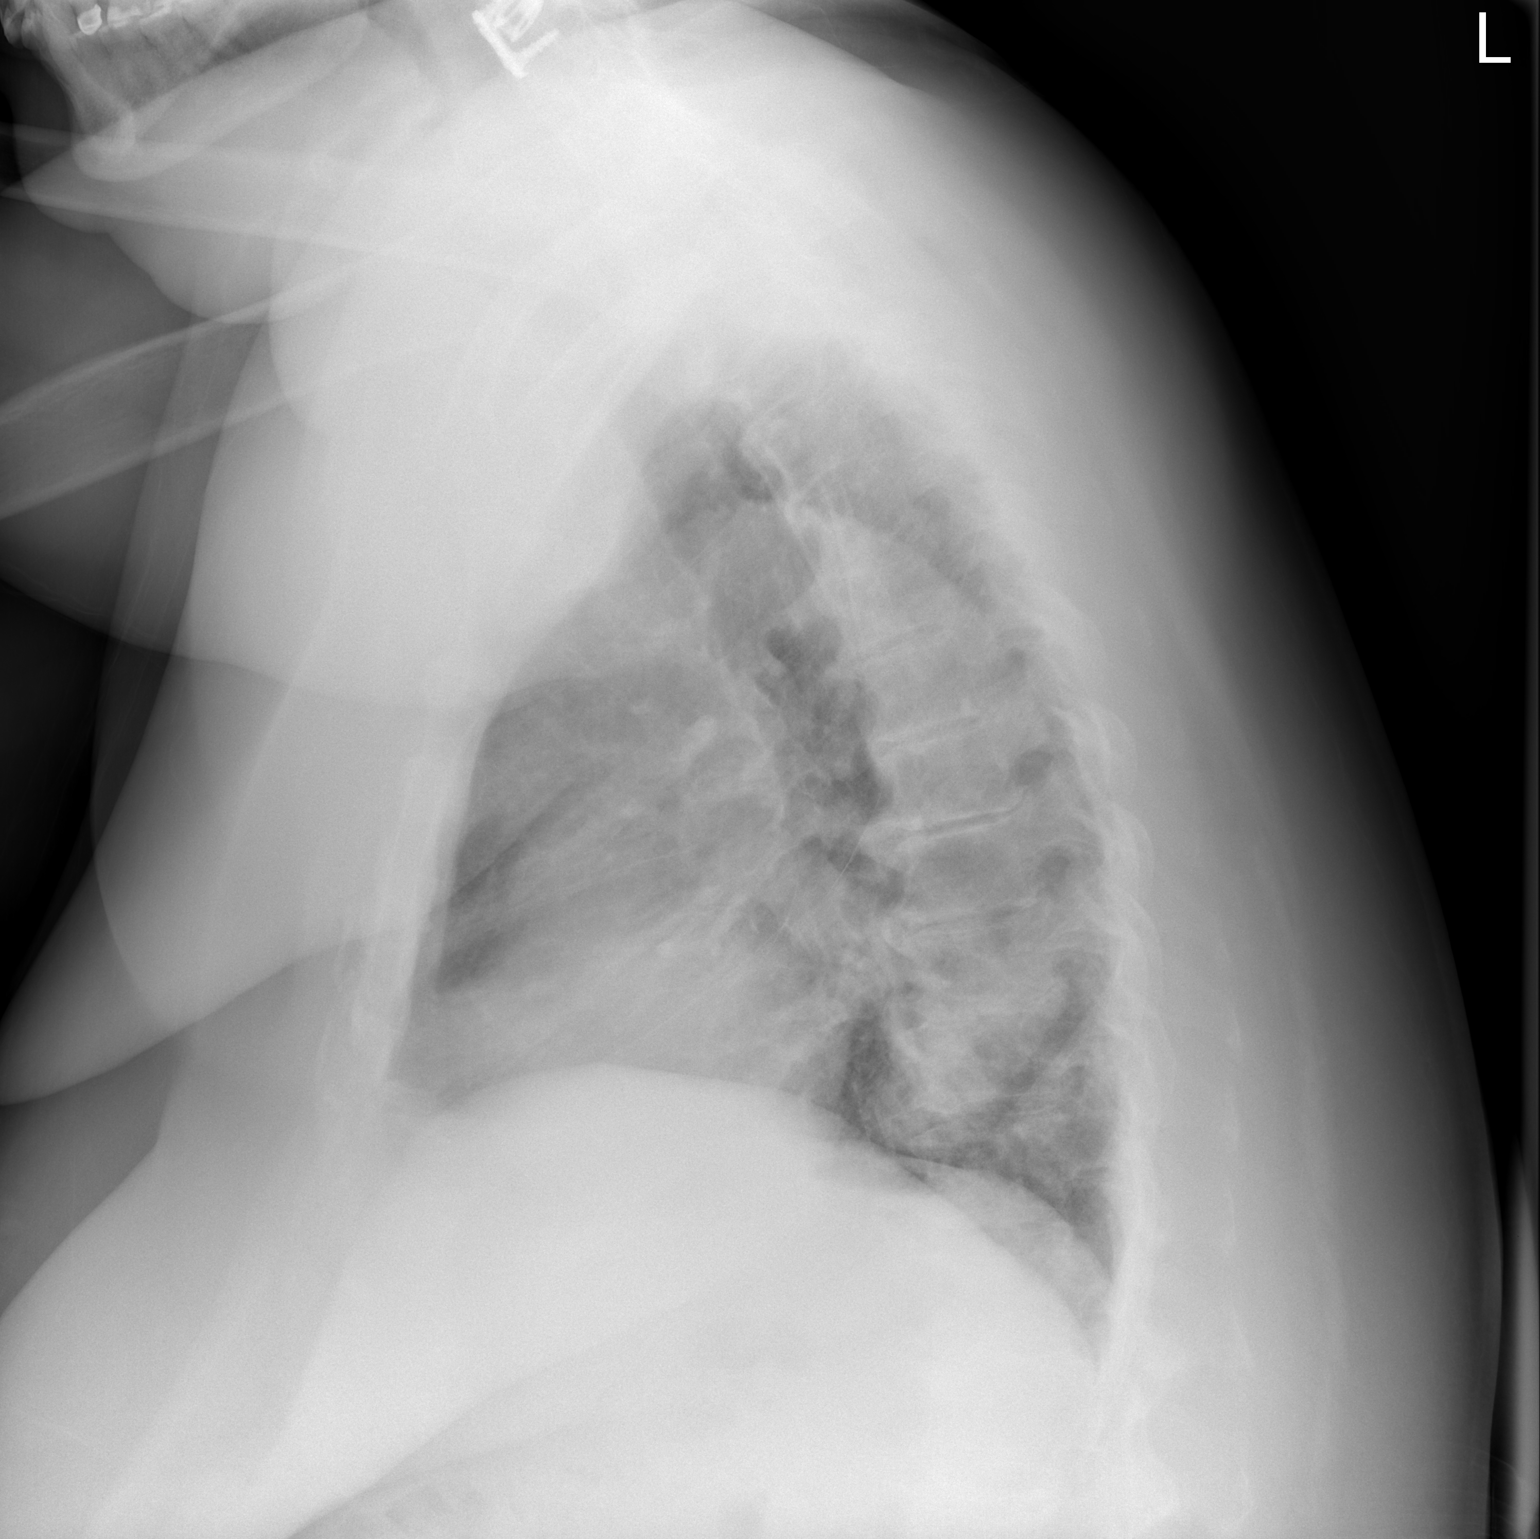

[2 of 2 positions shown; findings below may reference images not displayed]

FINDINGS: Lungs are clear. Heart size and pulmonary vascularity are normal. No
adenopathy. Aorta is mildly tortuous. There is degenerative change
in the thoracic spine. No evident adenopathy. There are surgical
clips in the region of the thyroid as well as in the lower cervical
spine.
IMPRESSION: Lungs clear. Heart size within normal limits. No evident adenopathy.

## 2022-02-19 ENCOUNTER — Ambulatory Visit (INDEPENDENT_AMBULATORY_CARE_PROVIDER_SITE_OTHER): Payer: Medicare PPO | Admitting: Family Medicine

## 2022-02-19 ENCOUNTER — Encounter (HOSPITAL_BASED_OUTPATIENT_CLINIC_OR_DEPARTMENT_OTHER): Payer: Self-pay | Admitting: Family Medicine

## 2022-02-19 VITALS — BP 161/84 | HR 61 | Ht 67.0 in | Wt 325.3 lb

## 2022-02-19 DIAGNOSIS — R600 Localized edema: Secondary | ICD-10-CM | POA: Diagnosis not present

## 2022-02-19 DIAGNOSIS — I10 Essential (primary) hypertension: Secondary | ICD-10-CM

## 2022-02-19 DIAGNOSIS — R300 Dysuria: Secondary | ICD-10-CM | POA: Diagnosis not present

## 2022-02-19 DIAGNOSIS — R0981 Nasal congestion: Secondary | ICD-10-CM | POA: Diagnosis not present

## 2022-02-19 LAB — POCT URINALYSIS DIPSTICK
Bilirubin, UA: NEGATIVE
Glucose, UA: NEGATIVE
Ketones, UA: NEGATIVE
Leukocytes, UA: NEGATIVE
Nitrite, UA: NEGATIVE
Protein, UA: NEGATIVE
Spec Grav, UA: <=1.005 — AB (ref 1.010–1.025)
Urobilinogen, UA: 0.2 E.U./dL
pH, UA: 5.5 (ref 5.0–8.0)

## 2022-02-19 MED ORDER — AMOXICILLIN 875 MG PO TABS: 875.0000 mg | ORAL_TABLET | Freq: Two times a day (BID) | ORAL | 0 refills | Status: AC

## 2022-02-19 NOTE — Assessment & Plan Note (Signed)
Patient reports issues with weight gain, elevated BMI.  She requests referral to healthy weight and wellness clinic.  Referral placed today.  Will have patient follow-up with her PCP regarding ongoing weight loss measures

## 2022-02-19 NOTE — Progress Notes (Signed)
Procedures performed today:    None.  Independent interpretation of notes and tests performed by another provider:   None.  Brief History, Exam, Impression, and Recommendations:    BP (!) 161/84   Pulse 61   Ht '5\' 7"'$  (1.702 m)   Wt (!) 325 lb 4.8 oz (147.6 kg)   SpO2 99%   BMI 50.95 kg/m   Primary hypertension Blood pressure is elevated in office today.  She reports that today she did not take valsartan which she will usually take in the morning.  Does have some headache currently this is more so related to sinus congestion/sinus pressure.  Denies any current issues with chest pain Recommend adherence to blood pressure medications. Patient was due for follow-up with cardiology on Friday, however it appears she missed that appointment.  Recommend that she reach out to cardiology office in order to schedule follow-up  Sinus congestion Patient reports that for about 10 days she has been experiencing some sinus congestion, postnasal drip, sinus pressure.  Has had frontal headache as result of this.  Has used some OTC medications, no nasal sprays tried as of yet.  She has had intermittent chills, this has been an ongoing problem for patient, the symptoms started prior to sinus congestion.  She has not had any fever. On exam, patient does have some tenderness to palpation over maxillary sinuses, less so over frontal sinuses.  She is in no acute distress during visit, afebrile. Discussed treatment options with patient, would recommend proceeding with nasal saline spray, intranasal steroid spray.  Also discussed potential role for antibiotics, she would prefer to proceed with antibiotic therapy, will treat with short course of amoxicillin at this time.  Discussed potential side effects, importance of completing antibiotic course of antibiotics  Obesity, Class III, BMI 40-49.9 (morbid obesity) ([place]) Patient reports issues with weight gain, elevated BMI.  She requests referral to healthy  weight and wellness clinic.  Referral placed today.  Will have patient follow-up with her PCP regarding ongoing weight loss measures  Bilateral lower extremity edema Chronic issue for patient, feels that it has been progressive.  Did have relatively recent visit with cardiology with changes to medications.  Dose of Lasix was transition to daily administration with discontinuation of hydrochlorothiazide.  It is felt that bilateral lower extremity edema is related to chronic venous since patient see due to prior cardiac evaluation including echocardiogram. Discussed options with patient, she would like referral to vascular and vein specialist for further evaluation and management, referral placed today Advised for continued follow-up with her PCP regarding management of chronic medical issues  Dysuria She reports that she has dysuria, she also indicates that she has been having chronic issues with recurrent UTI, urinary frequency.  She does follow with a urologist locally, Dr. Abner Greenspan.  Has not had recent office visit.  Has not had any new abdominal pain.  As above, has chills ongoing for some time, no fever or night sweats.  Urinalysis completed today which was unremarkable found to have negative nitrites and leukocyte esterase Discussed findings with patient, would not treat empirically and do not feel that urine culture is necessary Given her to ongoing issues with urinary frequency and intermittent dysuria, recommend urologist  Spent 43 minutes on this patient encounter, including preparation, chart review, face-to-face counseling with patient and coordination of care, and documentation of encounter  Return in about 3 weeks (around 03/12/2022) for Follow-up with SB.   ___________________________________________ Lauryl Seyer de [place], MD, ABFM, CAQSM [facility] [place]

## 2022-02-19 NOTE — Assessment & Plan Note (Signed)
She reports that she has dysuria, she also indicates that she has been having chronic issues with recurrent UTI, urinary frequency.  She does follow with a urologist locally, Dr. Abner Greenspan.  Has not had recent office visit.  Has not had any new abdominal pain.  As above, has chills ongoing for some time, no fever or night sweats.  Urinalysis completed today which was unremarkable found to have negative nitrites and leukocyte esterase Discussed findings with patient, would not treat empirically and do not feel that urine culture is necessary Given her to ongoing issues with urinary frequency and intermittent dysuria, recommend urologist

## 2022-02-19 NOTE — Assessment & Plan Note (Signed)
Chronic issue for patient, feels that it has been progressive.  Did have relatively recent visit with cardiology with changes to medications.  Dose of Lasix was transition to daily administration with discontinuation of hydrochlorothiazide.  It is felt that bilateral lower extremity edema is related to chronic venous since patient see due to prior cardiac evaluation including echocardiogram. Discussed options with patient, she would like referral to vascular and vein specialist for further evaluation and management, referral placed today Advised for continued follow-up with her PCP regarding management of chronic medical issues

## 2022-02-19 NOTE — Assessment & Plan Note (Signed)
>>  ASSESSMENT AND PLAN FOR BILATERAL LOWER EXTREMITY EDEMA WRITTEN ON 02/19/2022 12:56 PM BY DE PERU, RAYMOND J, MD  Chronic issue for patient, feels that it has been progressive.  Did have relatively recent visit with cardiology with changes to medications.  Dose of Lasix  was transition to daily administration with discontinuation of hydrochlorothiazide .  It is felt that bilateral lower extremity edema is related to chronic venous since patient see due to prior cardiac evaluation including echocardiogram. Discussed options with patient, she would like referral to vascular and vein specialist for further evaluation and management, referral placed today Advised for continued follow-up with her PCP regarding management of chronic medical issues

## 2022-02-19 NOTE — Assessment & Plan Note (Signed)
Patient reports that for about 10 days she has been experiencing some sinus congestion, postnasal drip, sinus pressure.  Has had frontal headache as result of this.  Has used some OTC medications, no nasal sprays tried as of yet.  She has had intermittent chills, this has been an ongoing problem for patient, the symptoms started prior to sinus congestion.  She has not had any fever. On exam, patient does have some tenderness to palpation over maxillary sinuses, less so over frontal sinuses.  She is in no acute distress during visit, afebrile. Discussed treatment options with patient, would recommend proceeding with nasal saline spray, intranasal steroid spray.  Also discussed potential role for antibiotics, she would prefer to proceed with antibiotic therapy, will treat with short course of amoxicillin at this time.  Discussed potential side effects, importance of completing antibiotic course of antibiotics

## 2022-02-19 NOTE — Assessment & Plan Note (Addendum)
Blood pressure is elevated in office today.  She reports that today she did not take valsartan which she will usually take in the morning.  Does have some headache currently this is more so related to sinus congestion/sinus pressure.  Denies any current issues with chest pain Recommend adherence to blood pressure medications. Patient was due for follow-up with cardiology on Friday, however it appears she missed that appointment.  Recommend that she reach out to cardiology office in order to schedule follow-up

## 2022-03-14 ENCOUNTER — Other Ambulatory Visit (HOSPITAL_BASED_OUTPATIENT_CLINIC_OR_DEPARTMENT_OTHER): Payer: Self-pay | Admitting: Nurse Practitioner

## 2022-03-14 DIAGNOSIS — E559 Vitamin D deficiency, unspecified: Secondary | ICD-10-CM

## 2022-03-14 DIAGNOSIS — M6281 Muscle weakness (generalized): Secondary | ICD-10-CM

## 2022-03-15 ENCOUNTER — Encounter (HOSPITAL_BASED_OUTPATIENT_CLINIC_OR_DEPARTMENT_OTHER): Payer: Self-pay

## 2022-03-15 ENCOUNTER — Ambulatory Visit (HOSPITAL_BASED_OUTPATIENT_CLINIC_OR_DEPARTMENT_OTHER): Payer: Medicare PPO | Admitting: Nurse Practitioner

## 2022-03-22 ENCOUNTER — Encounter (HOSPITAL_BASED_OUTPATIENT_CLINIC_OR_DEPARTMENT_OTHER): Payer: Self-pay | Admitting: Nurse Practitioner

## 2022-04-18 ENCOUNTER — Ambulatory Visit (HOSPITAL_BASED_OUTPATIENT_CLINIC_OR_DEPARTMENT_OTHER): Payer: Medicare PPO

## 2022-04-18 ENCOUNTER — Other Ambulatory Visit (HOSPITAL_BASED_OUTPATIENT_CLINIC_OR_DEPARTMENT_OTHER): Payer: Self-pay | Admitting: Nurse Practitioner

## 2022-04-18 DIAGNOSIS — N3090 Cystitis, unspecified without hematuria: Secondary | ICD-10-CM

## 2022-04-18 MED ORDER — FLUCONAZOLE 150 MG PO TABS: ORAL_TABLET | ORAL | 2 refills | Status: DC

## 2022-04-18 MED ORDER — SULFAMETHOXAZOLE-TRIMETHOPRIM 800-160 MG PO TABS: 1.0000 | ORAL_TABLET | Freq: Two times a day (BID) | ORAL | 0 refills | Status: DC

## 2022-04-30 ENCOUNTER — Other Ambulatory Visit: Payer: Self-pay | Admitting: *Deleted

## 2022-04-30 DIAGNOSIS — M7989 Other specified soft tissue disorders: Secondary | ICD-10-CM

## 2022-05-10 ENCOUNTER — Ambulatory Visit (HOSPITAL_COMMUNITY): Admission: RE | Admit: 2022-05-10 | Payer: Medicare PPO | Source: Ambulatory Visit

## 2022-05-13 ENCOUNTER — Ambulatory Visit: Payer: Medicare PPO | Admitting: Physician Assistant

## 2022-05-13 ENCOUNTER — Encounter: Payer: Self-pay | Admitting: Physician Assistant

## 2022-05-13 ENCOUNTER — Ambulatory Visit (HOSPITAL_COMMUNITY)
Admission: RE | Admit: 2022-05-13 | Discharge: 2022-05-13 | Disposition: A | Discharge status: Home / Self Care | Payer: Medicare PPO | Source: Ambulatory Visit | Attending: Physician Assistant | Admitting: Physician Assistant

## 2022-05-13 VITALS — BP 164/94 | HR 58 | Temp 97.7°F | Resp 14 | Ht 67.0 in | Wt 321.0 lb

## 2022-05-13 DIAGNOSIS — I890 Lymphedema, not elsewhere classified: Secondary | ICD-10-CM | POA: Diagnosis not present

## 2022-05-13 DIAGNOSIS — M7989 Other specified soft tissue disorders: Secondary | ICD-10-CM | POA: Insufficient documentation

## 2022-05-13 NOTE — Progress Notes (Addendum)
Requested by:  de [place], Raymond J, [place],  [place] [address]  Reason for consultation: bilateral lower extremity edema    History of Present Illness   Natasha [name] is a 71 y.o. (Nov 06, 1950) female who presents for evaluation of bilateral extremity edema.  She states she is dealt with bilateral lower leg swelling for years now, and she usually uses compression stockings with some relief.  Usually her swelling would just be in her ankles.  However over the past 3 months she has had increased leg swelling that is progressed to her feet and toes.  Her symptoms include aching, tired, swollen, heavy legs and feet.  Sitting and standing for long periods of time makes this worse.  She has no history of vein procedures.  She does have an history of DVT in the left leg and pulmonary embolism s/p COVID infection.  She is chronically anticoagulated with Xarelto.  She states over the past 3 months while her swelling was getting worse she attempted to use compression, elevate her legs, and exercise to reduce the swelling.  She usually goes to the aquatic center and swims a couple of laps daily.  However none of these conservative therapies have helped her.  She states that she feels like she is holding onto a lot of weight.  She is also dealing with new onset, intermittent chest pressure over the past couple of months that has been investigated by her cardiologist.  At her most recent appointment with them on 01/25/2022, her echo revealed no evidence of right or left-sided heart failure.  It was also suspected that her history of COVID-pneumonia and PE no longer significantly affect her heart.  She was switched from valsartan-HCTZ to valsartan only.  She was told to continue her amlodipine as well.  Her furosemide as needed was also switched to furosemide 1x daily, however she states she is not been taking it daily.  Past Medical History:  Diagnosis Date   Abdominal spasms 12/14/2020    Acute pulmonary embolism ([place]) 08/21/2019   Bacterial conjunctivitis of both eyes 12/14/2020   Bilateral leg edema 02/10/2020   Bradycardia    Breast cancer screening by mammogram 11/24/2020   CARCINOMA, THYROID GLAND, PAPILLARY 05/04/2008   Stage 2, 8/09: thyroidectomy for 2.7cm papillary adenocarcinoma (t2 n0 mo) 9/09: I-131 rx, 108 [facility] 05/10: tg is neg (ab neg) , total body scan is neg   Colon cancer screening 11/24/2020   Colon polyps    Complication of anesthesia    trouble with airway   Cough 12/25/2007   Qualifier: Diagnosis of  By: Doy Mince LPN, Megan     UEAVW-09 08/19/2019   Diverticulosis    DVT (deep venous thrombosis) ([place])    Edema 12/22/2008   Encounter to establish care 11/24/2020   History of 2019 novel coronavirus disease (COVID-19) 11/09/2019   HYPERTENSION 12/25/2007   HYPOTHYROIDISM, POSTSURGICAL 05/04/2008   LEG PAIN, LEFT 12/09/2008   Lumbago with sciatica, right side 01/08/2021   Memory loss due to medical condition 11/09/2019   Muscle weakness    Nausea and vomiting 05/23/2008   Qualifier: Diagnosis of  By: Loanne Drilling MD, Roena Malady of this note might be different from the original. Qualifier: Diagnosis of  By: Loanne Drilling MD, Hilliard Clark A   Numbness and tingling    OSA (obstructive sleep apnea) 06/15/2013   CPAP   Paresthesia 01/04/2020   Peripheral edema 12/22/2008   Qualifier: Diagnosis of  By: Johnsie Cancel, MD,  FACC, Rolene Course    Pneumonia due to COVID-19 virus    Pulmonary embolism [facility]    Pulmonary embolism ([place]) 08/23/2019   Right lower quadrant pain 11/24/2020   Sciatica of left side 12/04/2011   Skin sensation disturbance 12/05/2008   Qualifier: Diagnosis of  By: Asa Lente MD, Serita Grit of this note might be different from the original. Qualifier: Diagnosis of  By: Asa Lente MD, Mateo Flow A   Spasm of muscle of lower back 12/14/2020   Vaginal candidiasis 03/16/2021   Weakness 01/04/2020    Past Surgical History:  Procedure Laterality Date   ABDOMINAL HYSTERECTOMY      due to bleeding, ovaries remain   ANKLE SURGERY Right    CERVICAL FUSION     x 2   COLONOSCOPY WITH PROPOFOL N/A 08/08/2015   Procedure: COLONOSCOPY WITH PROPOFOL;  Surgeon: Juanita Craver, MD;  Location: [facility];  Service: Endoscopy;  Laterality: N/A;   KNEE SURGERY Left    TOTAL THYROIDECTOMY      Social History   Socioeconomic History   Marital status: Widowed    Spouse name: Not on file   Number of children: 3   Years of education: college   Highest education level: Master's degree (e.g., MA, MS, MEng, MEd, MSW, MBA)  Occupational History   Occupation: Teacher - Sport and exercise psychologist  Tobacco Use   Smoking status: Never   Smokeless tobacco: Never  Vaping Use   Vaping Use: Never used  Substance and Sexual Activity   Alcohol use: Never    Comment: 1 every 1-2 months   Drug use: No   Sexual activity: Not Currently  Other Topics Concern   Not on file  Social History Narrative   Lives alone.   Right-handed.   No daily caffeine use.   Social Determinants of Health   Financial Resource Strain: Not on file  Food Insecurity: Not on file  Transportation Needs: Not on file  Physical Activity: Not on file  Stress: Not on file  Social Connections: Not on file  Intimate Partner Violence: Not on file    Family History  Problem Relation Age of Onset   Heart disease Mother    Kidney disease Mother    Other Father        unsure of medical history    Current Outpatient Medications  Medication Sig Dispense Refill   amLODipine (NORVASC) 5 MG tablet Take 1 tablet (5 mg total) by mouth at bedtime. For Blood Pressure 30 tablet 11   celecoxib (CELEBREX) 100 MG capsule Take 1 capsule (100 mg total) by mouth 2 (two) times daily. 60 capsule 11   fluconazole (DIFLUCAN) 150 MG tablet Take one tablet by mouth at the first sign of symptoms of yeast. If no resolution, repeat dose in 72 hours. 2 tablet 2   furosemide (LASIX) 20 MG tablet Take 1 tablet (20 mg total) by mouth daily. 90  tablet 3   GAVILYTE-G 236 g solution SMARTSIG:400 Milliliter(s) By Mouth As Directed     Krill Oil 1000 MG CAPS Take 1,000 mg by mouth 2 (two) times daily.     montelukast (SINGULAIR) 10 MG tablet Take 1 tablet (10 mg total) by mouth at bedtime. 90 tablet 3   Multiple Vitamin (MULTIVITAMIN WITH MINERALS) TABS tablet Take 1 tablet by mouth daily.     pantoprazole (PROTONIX) 40 MG tablet Take 1 tablet (40 mg total) by mouth 2 (two) times daily. 60 tablet 6   rivaroxaban (XARELTO) 20  MG TABS tablet Take 1 tablet (20 mg total) by mouth daily with supper. 30 tablet 11   sulfamethoxazole-trimethoprim (BACTRIM DS) 800-160 MG tablet Take 1 tablet by mouth 2 (two) times daily. 10 tablet 0   SYNTHROID 150 MCG tablet Take 141mg by mouth 5 days a week and 722m by mouth 2 days a week. 90 tablet 3   Trospium Chloride 60 MG CP24 Take 1 capsule by mouth daily at 12 noon.     valsartan (DIOVAN) 160 MG tablet Take 1 tablet (160 mg total) by mouth daily. 90 tablet 3   Vitamin D, Ergocalciferol, (DRISDOL) 1.25 MG (50000 UNIT) CAPS capsule TAKE 1 CAPSULE (50,000 UNITS TOTAL) BY MOUTH EVERY 7 (SEVEN) DAYS. TAKE FOR 8 TOTAL DOSES(WEEKS) 8 capsule 0   No current facility-administered medications for this visit.    Allergies  Allergen Reactions   Codeine Shortness Of Breath, Palpitations and Other (See Comments)   Eggs Or Egg-Derived Products Nausea And Vomiting    PROJECTILE VOMITING    REVIEW OF SYSTEMS (negative unless checked):   Cardiac:  '[x]'$  Chest pain or chest pressure? '[]'$  Shortness of breath upon activity? '[x]'$  Shortness of breath when lying flat? '[]'$  Irregular heart rhythm?  Vascular:  '[x]'$  Pain in calf, thigh, or hip brought on by walking? '[]'$  Pain in feet at night that wakes you up from your sleep? '[]'$  Blood clot in your veins? '[x]'$  Leg swelling?  Pulmonary:  '[]'$  Oxygen at home? '[]'$  Productive cough? '[]'$  Wheezing?  Neurologic:  '[]'$  Sudden weakness in arms or legs? '[]'$  Sudden numbness in arms or  legs? '[]'$  Sudden onset of difficult speaking or slurred speech? '[]'$  Temporary loss of vision in one eye? '[]'$  Problems with dizziness?  Gastrointestinal:  '[]'$  Blood in stool? '[]'$  Vomited blood?  Genitourinary:  '[]'$  Burning when urinating? '[]'$  Blood in urine?  Psychiatric:  '[]'$  Major depression  Hematologic:  '[]'$  Bleeding problems? '[]'$  Problems with blood clotting?  Dermatologic:  '[]'$  Rashes or ulcers?  Constitutional:  '[]'$  Fever or chills?  Ear/Nose/Throat:  '[]'$  Change in hearing? '[]'$  Nose bleeds? '[]'$  Sore throat?  Musculoskeletal:  '[]'$  Back pain? '[]'$  Joint pain? '[]'$  Muscle pain?   Physical Examination     Vitals:   05/13/22 1257  BP: (!) 164/94  Pulse: (!) 58  Resp: 14  Temp: 97.7 F (36.5 C)  TempSrc: Temporal  SpO2: 98%  Weight: (!) 321 lb (145.6 kg)  Height: '5\' 7"'$  (1.702 m)   Body mass index is 50.28 kg/m.  General:  WDWN in NAD; vital signs documented above Gait: Not observed HENT: WNL, normocephalic Pulmonary: normal non-labored breathing , without Rales, rhonchi,  wheezing Cardiac: regular HR, without murmurs without carotid bruit Abdomen: soft, NT, no masses Skin: without rashes Vascular Exam/Pulses: Radial and DP pulses 2+ bilaterally. Extremities: without varicose veins, without reticular veins, with edema, without stasis pigmentation, without lipodermatosclerosis, without ulcers.  Edema of the lower legs, feet and toes bilaterally. Hyperkeratosis of bilateral lower extremities Musculoskeletal: no muscle wasting or atrophy  Neurologic: A&O X 3;  No focal weakness or paresthesias are detected Psychiatric:  The pt has Normal affect.  Non-invasive Vascular Imaging   LLE Venous Insufficiency Duplex (05/13/2022):   +--------------+---------+------+-----------+------------+--------+  LEFT          Reflux NoRefluxReflux TimeDiameter cmsComments                          Yes                                    +--------------+---------+------+-----------+------------+--------+  CFV           no                                              +--------------+---------+------+-----------+------------+--------+  FV mid        no                                              +--------------+---------+------+-----------+------------+--------+  Popliteal     no                                              +--------------+---------+------+-----------+------------+--------+  GSV at [facility]    no                            0.69              +--------------+---------+------+-----------+------------+--------+  GSV prox thighno                            0.65              +--------------+---------+------+-----------+------------+--------+  GSV mid thigh no                            0.64              +--------------+---------+------+-----------+------------+--------+  GSV dist thighno                            0.48              +--------------+---------+------+-----------+------------+--------+  GSV at knee   no                            0.47              +--------------+---------+------+-----------+------------+--------+  SSV Pop Fossa no                            0.36              +--------------+---------+------+-----------+------------+--------+    Medical Decision Making    Natasha [name] is a 71 y.o. female who presents with: bilateral lower extremity edema  Based off the patient's venous reflux study in the left lower extremity the patient has a competent deep and superficial venous system with no evidence of superficial or deep vein thrombosis.  The patient also does not have any varicose veins She has significant bilateral lower extremity edema and skin thickening that involves the calves, feet, and toes suggestive of lymphedema.  She has attempted all of the following therapies for the past 3 months: compression stockings, elevation, and  exercise. These therapies have provided no relief and persistent swelling. She also has skin changes due to lymphedema including hyperkeratosis. I have expressed to the patient the importance of taking her furosemide as prescribed by her cardiologist. We will refer the patient  to get lymphedema pumps and follow-up with [place] as needed   Gerri Lins, PA-C [facility]: [phone]  05/13/2022, 1:27 PM  Clinic MD: Trula Slade

## 2022-05-17 ENCOUNTER — Telehealth: Payer: Self-pay

## 2022-05-17 NOTE — Patient Outreach (Signed)
  Care Coordination   05/17/2022 Name: Natasha [name] MRN: [id number] DOB: 1951/07/16   Care Coordination Outreach Attempts:  An unsuccessful telephone outreach was attempted today to offer the patient information about available care coordination services as a benefit of their health plan.   Follow Up Plan:  Additional outreach attempts will be made to offer the patient care coordination information and services.   Encounter Outcome:  No Answer  Care Coordination Interventions Activated:  No   Care Coordination Interventions:  No, not indicated      Enzo Montgomery, RN,BSN,CCM [place] [facility] Telephonic Care Management Coordinator Direct Phone: [phone] Toll Free: [phone] Fax: [phone]

## 2022-05-23 ENCOUNTER — Telehealth: Payer: Self-pay

## 2022-05-23 NOTE — Patient Outreach (Signed)
  Care Coordination   05/23/2022 Name: Natasha [name] MRN: [id number] DOB: April 24, 1951   Care Coordination Outreach Attempts:  A second unsuccessful outreach was attempted today to offer the patient with information about available care coordination services as a benefit of their health plan.     Follow Up Plan:  Additional outreach attempts will be made to offer the patient care coordination information and services.   Encounter Outcome:  No Answer  Care Coordination Interventions Activated:  No   Care Coordination Interventions:  No, not indicated      Enzo Montgomery, RN,BSN,CCM [place] [facility] Telephonic Care Management Coordinator Direct Phone: [phone] Toll Free: [phone] Fax: [phone]

## 2022-05-28 ENCOUNTER — Telehealth: Payer: Self-pay

## 2022-05-28 NOTE — Patient Outreach (Signed)
  Care Coordination   05/28/2022 Name: Natasha [name] MRN: [id number] DOB: 1951-06-22   Care Coordination Outreach Attempts:  A third unsuccessful outreach was attempted today to offer the patient with information about available care coordination services as a benefit of their health plan.   Follow Up Plan:  No further outreach attempts will be made at this time. We have been unable to contact the patient to offer or enroll patient in care coordination services  Encounter Outcome:  No Answer  Care Coordination Interventions Activated:  No   Care Coordination Interventions:  No, not indicated      Enzo Montgomery, RN,BSN,CCM [place] [facility] Telephonic Care Management Coordinator Direct Phone: [phone] Toll Free: [phone] Fax: [phone]

## 2022-06-10 ENCOUNTER — Other Ambulatory Visit (HOSPITAL_BASED_OUTPATIENT_CLINIC_OR_DEPARTMENT_OTHER): Payer: Self-pay | Admitting: Cardiology

## 2022-06-10 DIAGNOSIS — I10 Essential (primary) hypertension: Secondary | ICD-10-CM

## 2022-06-10 NOTE — Telephone Encounter (Signed)
Rx(s) sent to pharmacy electronically.  

## 2022-07-11 ENCOUNTER — Telehealth (HOSPITAL_BASED_OUTPATIENT_CLINIC_OR_DEPARTMENT_OTHER): Payer: Medicare PPO | Admitting: Nurse Practitioner

## 2022-07-12 ENCOUNTER — Encounter (HOSPITAL_BASED_OUTPATIENT_CLINIC_OR_DEPARTMENT_OTHER): Payer: Self-pay

## 2022-07-12 ENCOUNTER — Other Ambulatory Visit (HOSPITAL_BASED_OUTPATIENT_CLINIC_OR_DEPARTMENT_OTHER): Payer: Medicare PPO

## 2022-07-12 ENCOUNTER — Emergency Department (HOSPITAL_BASED_OUTPATIENT_CLINIC_OR_DEPARTMENT_OTHER): Payer: Medicare PPO

## 2022-07-12 ENCOUNTER — Other Ambulatory Visit: Payer: Self-pay

## 2022-07-12 ENCOUNTER — Emergency Department (HOSPITAL_BASED_OUTPATIENT_CLINIC_OR_DEPARTMENT_OTHER)
Admission: EM | Admit: 2022-07-12 | Discharge: 2022-07-12 | Disposition: A | Discharge status: Home / Self Care | Payer: Medicare PPO | Attending: Emergency Medicine | Admitting: Emergency Medicine

## 2022-07-12 DIAGNOSIS — R109 Unspecified abdominal pain: Secondary | ICD-10-CM | POA: Diagnosis not present

## 2022-07-12 DIAGNOSIS — I700 Atherosclerosis of aorta: Secondary | ICD-10-CM | POA: Diagnosis not present

## 2022-07-12 DIAGNOSIS — R1031 Right lower quadrant pain: Secondary | ICD-10-CM | POA: Diagnosis not present

## 2022-07-12 DIAGNOSIS — N3090 Cystitis, unspecified without hematuria: Secondary | ICD-10-CM | POA: Diagnosis not present

## 2022-07-12 DIAGNOSIS — I509 Heart failure, unspecified: Secondary | ICD-10-CM | POA: Diagnosis not present

## 2022-07-12 DIAGNOSIS — I110 Hypertensive heart disease with heart failure: Secondary | ICD-10-CM | POA: Insufficient documentation

## 2022-07-12 DIAGNOSIS — Z79899 Other long term (current) drug therapy: Secondary | ICD-10-CM | POA: Diagnosis not present

## 2022-07-12 LAB — COMPREHENSIVE METABOLIC PANEL
ALT: 14 U/L (ref 0–44)
AST: 20 U/L (ref 15–41)
Albumin: 4.6 g/dL (ref 3.5–5.0)
Alkaline Phosphatase: 84 U/L (ref 38–126)
Anion gap: 10 (ref 5–15)
BUN: 18 mg/dL (ref 8–23)
CO2: 26 mmol/L (ref 22–32)
Calcium: 9.9 mg/dL (ref 8.9–10.3)
Chloride: 103 mmol/L (ref 98–111)
Creatinine, Ser: 0.89 mg/dL (ref 0.44–1.00)
GFR, Estimated: >60 mL/min (ref >60)
Glucose, Bld: 96 mg/dL (ref 70–99)
Potassium: 4.4 mmol/L (ref 3.5–5.1)
Sodium: 139 mmol/L (ref 135–145)
Total Bilirubin: 0.6 mg/dL (ref 0.3–1.2)
Total Protein: 8.7 g/dL — ABNORMAL HIGH (ref 6.5–8.1)

## 2022-07-12 LAB — CBC
HCT: 43.7 % (ref 36.0–46.0)
Hemoglobin: 14.1 g/dL (ref 12.0–15.0)
MCH: 29.1 pg (ref 26.0–34.0)
MCHC: 32.3 g/dL (ref 30.0–36.0)
MCV: 90.1 fL (ref 80.0–100.0)
Platelets: 288 10*3/uL (ref 150–400)
RBC: 4.85 MIL/uL (ref 3.87–5.11)
RDW: 15.1 % (ref 11.5–15.5)
WBC: 7.8 10*3/uL (ref 4.0–10.5)
nRBC: 0 % (ref 0.0–0.2)

## 2022-07-12 LAB — LIPASE, BLOOD: Lipase: 11 U/L (ref 11–51)

## 2022-07-12 MED ORDER — IOHEXOL 300 MG/ML  SOLN
100.0000 mL | Freq: Once | INTRAMUSCULAR | Status: AC | PRN
  Administered 2022-07-12: 100 mL via INTRAVENOUS

## 2022-07-12 MED ORDER — MORPHINE SULFATE (PF) 4 MG/ML IV SOLN
8.0000 mg | Freq: Once | INTRAVENOUS | Status: AC
  Administered 2022-07-12: 8 mg via INTRAVENOUS
  Filled 2022-07-12: qty 2

## 2022-07-12 MED ORDER — ACETAMINOPHEN 500 MG PO TABS: 500.0000 mg | ORAL_TABLET | Freq: Four times a day (QID) | ORAL | 0 refills | Status: DC | PRN

## 2022-07-12 MED ORDER — CEPHALEXIN 250 MG PO CAPS
500.0000 mg | ORAL_CAPSULE | Freq: Once | ORAL | Status: AC
  Administered 2022-07-12: 500 mg via ORAL
  Filled 2022-07-12: qty 2

## 2022-07-12 MED ORDER — OXYCODONE-ACETAMINOPHEN 5-325 MG PO TABS: 1.0000 | ORAL_TABLET | Freq: Two times a day (BID) | ORAL | 0 refills | Status: DC | PRN

## 2022-07-12 MED ORDER — ONDANSETRON HCL 4 MG/2ML IJ SOLN
4.0000 mg | Freq: Once | INTRAMUSCULAR | Status: AC
  Administered 2022-07-12: 4 mg via INTRAVENOUS
  Filled 2022-07-12: qty 2

## 2022-07-12 MED ORDER — CEPHALEXIN 500 MG PO CAPS: 500.0000 mg | ORAL_CAPSULE | Freq: Three times a day (TID) | ORAL | 0 refills | Status: DC

## 2022-07-12 NOTE — ED Triage Notes (Signed)
Pt presents POV with RLQ pain radiating around to her back and into her upper abd starting yesterday afternoon. Pt reports eating nuts Wednesday, hx of diverticulitis   Last normal BM yesterday morning. Pt denies hx of kidney stones. Pt denies burning with urination

## 2022-07-12 NOTE — Discharge Instructions (Addendum)
You were seen in the ER for right-sided abdominal pain and burning with urination.  CT scan was ordered, it is negative for any acute appendicitis, cholecystitis, kidney stone or diverticulitis.  Our suspicion is that most likely her symptoms are because of the UTI.  Take the antibiotics that are prescribed.  Follow-up with your primary care doctor in 1 week.  Please return to the ER if your symptoms worsen; you have increased pain, fevers, chills, inability to keep any medications down.

## 2022-07-12 NOTE — ED Provider Notes (Addendum)
[facility] DEPT Provider Note   CSN: [id number] Arrival date & time: 07/12/22  7628     History  Chief Complaint  Patient presents with   Abdominal Pain    Natasha [name] is a 71 y.o. female.  HPI     71 year old female with history of CHF, hypertension, total abdominal hysterectomy comes in with chief complaint of right-sided abdominal pain.  Patient states that about 2 or 3 times a year she will have right-sided abdominal pain.  Typically the pain is self-limiting.  She has come to the ER in the past, and had CT scans that were negative for any acute findings.  This episode however is constant and more severe.  Patient started having right-sided abdominal pain yesterday that is radiating to her back and the pain is described as sharp, stabbing pain.  It is now constant and excruciating.  She has nausea without emesis.  Last bowel movement was yesterday.  Review of systems is also positive for burning with urination that started yesterday.  Patient has history of UTI with similar symptoms.  Home Medications Prior to Admission medications   Medication Sig Start Date End Date Taking? Authorizing Provider  acetaminophen (TYLENOL) 500 MG tablet Take 1 tablet (500 mg total) by mouth every 6 (six) hours as needed. 07/12/22  Yes Teneil Shiller, MD  cephALEXin (KEFLEX) 500 MG capsule Take 1 capsule (500 mg total) by mouth 3 (three) times daily. 07/12/22  Yes Varney Biles, MD  oxyCODONE-acetaminophen (PERCOCET/ROXICET) 5-325 MG tablet Take 1 tablet by mouth every 12 (twelve) hours as needed for severe pain. 07/12/22  Yes Jackalynn Art, MD  amLODipine (NORVASC) 5 MG tablet TAKE 1 TABLET (5 MG TOTAL) BY MOUTH AT BEDTIME. FOR BLOOD PRESSURE 06/10/22   Buford Dresser, MD  celecoxib (CELEBREX) 100 MG capsule Take 1 capsule (100 mg total) by mouth 2 (two) times daily. 11/30/21   Orma Render, NP  fluconazole (DIFLUCAN) 150 MG tablet Take one tablet by mouth  at the first sign of symptoms of yeast. If no resolution, repeat dose in 72 hours. 04/18/22   Orma Render, NP  furosemide (LASIX) 20 MG tablet Take 1 tablet (20 mg total) by mouth daily. 01/25/22   Buford Dresser, MD  GAVILYTE-G 236 g solution SMARTSIG:400 Milliliter(s) By Mouth As Directed 06/15/21   [provider]  Javier Docker Oil 1000 MG CAPS Take 1,000 mg by mouth 2 (two) times daily.    [provider]  montelukast (SINGULAIR) 10 MG tablet Take 1 tablet (10 mg total) by mouth at bedtime. 04/20/21   Orma Render, NP  Multiple Vitamin (MULTIVITAMIN WITH MINERALS) TABS tablet Take 1 tablet by mouth daily.    [provider]  pantoprazole (PROTONIX) 40 MG tablet Take 1 tablet (40 mg total) by mouth 2 (two) times daily. 05/22/21   Isla Pence, MD  rivaroxaban (XARELTO) 20 MG TABS tablet Take 1 tablet (20 mg total) by mouth daily with supper. 11/30/21 11/25/22  Orma Render, NP  sulfamethoxazole-trimethoprim (BACTRIM DS) 800-160 MG tablet Take 1 tablet by mouth 2 (two) times daily. 04/18/22   Early, Coralee Pesa, NP  SYNTHROID 150 MCG tablet Take 152mg by mouth 5 days a week and 767m by mouth 2 days a week. 12/05/21   EaOrma RenderNP  Trospium Chloride 60 MG CP24 Take 1 capsule by mouth daily at 12 noon. 11/22/21   [provider]  valsartan (DIOVAN) 160 MG tablet Take 1 tablet (160 mg  total) by mouth daily. 01/25/22   Buford Dresser, MD  Vitamin D, Ergocalciferol, (DRISDOL) 1.25 MG (50000 UNIT) CAPS capsule TAKE 1 CAPSULE (50,000 UNITS TOTAL) BY MOUTH EVERY 7 (SEVEN) DAYS. TAKE FOR 8 TOTAL DOSES(WEEKS) 03/15/22   Early, Coralee Pesa, NP      Allergies    Codeine and Eggs or egg-derived products    Review of Systems   Review of Systems  All other systems reviewed and are negative.   Physical Exam Updated Vital Signs BP (!) 149/84   Pulse (!) 46   Temp 98.2 F (36.8 C)   Resp 19   SpO2 97%  Physical Exam Vitals and nursing note reviewed.   Constitutional:      Appearance: She is well-developed.  HENT:     Head: Atraumatic.  Cardiovascular:     Rate and Rhythm: Normal rate.  Pulmonary:     Effort: Pulmonary effort is normal.  Abdominal:     Tenderness: There is abdominal tenderness. There is no guarding or rebound. Negative signs include Murphy's sign and McBurney's sign.  Musculoskeletal:     Cervical back: Normal range of motion and neck supple.  Skin:    General: Skin is warm and dry.  Neurological:     Mental Status: She is alert and oriented to person, place, and time.     ED Results / Procedures / Treatments   Labs (all labs ordered are listed, but only abnormal results are displayed) Labs Reviewed  COMPREHENSIVE METABOLIC PANEL - Abnormal; Notable for the following components:      Result Value   Total Protein 8.7 (*)    All other components within normal limits  LIPASE, BLOOD  CBC  URINALYSIS, ROUTINE W REFLEX MICROSCOPIC    EKG None  Radiology CT ABDOMEN PELVIS W CONTRAST  Result Date: 07/12/2022 CLINICAL DATA:  Acute right lower quadrant abdominal pain. EXAM: CT ABDOMEN AND PELVIS WITH CONTRAST TECHNIQUE: Multidetector CT imaging of the abdomen and pelvis was performed using the standard protocol following bolus administration of intravenous contrast. RADIATION DOSE REDUCTION: This exam was performed according to the departmental dose-optimization program which includes automated exposure control, adjustment of the mA and/or kV according to patient size and/or use of iterative reconstruction technique. CONTRAST:  132m OMNIPAQUE IOHEXOL 300 MG/ML  SOLN COMPARISON:  May 22, 2021. FINDINGS: Lower chest: No acute abnormality. Hepatobiliary: No focal liver abnormality is seen. No gallstones, gallbladder wall thickening, or biliary dilatation. Pancreas: Unremarkable. No pancreatic ductal dilatation or surrounding inflammatory changes. Spleen: Normal in size without focal abnormality. Adrenals/Urinary  Tract: Adrenal glands appear normal. Small bilateral renal cysts are noted for which no further follow-up is required. No hydronephrosis or renal obstruction is noted. Urinary bladder is unremarkable. Stomach/Bowel: Stomach is within normal limits. Appendix appears normal. No evidence of bowel wall thickening, distention, or inflammatory changes. Sigmoid diverticulosis is noted without inflammation. Vascular/Lymphatic: Aortic atherosclerosis. No enlarged abdominal or pelvic lymph nodes. Reproductive: Status post hysterectomy. No adnexal masses. Other: No ascites is noted.  Stable diastasis recti. Musculoskeletal: Severe multilevel degenerative disc disease is noted in the lumbar spine. No acute osseous abnormality is noted. IMPRESSION: Sigmoid diverticulosis without inflammation. No acute abnormality seen in the abdomen or pelvis. Aortic Atherosclerosis (ICD10-I70.0). Electronically Signed   By: JMarijo ConceptionM.D.   On: 07/12/2022 10:28    Procedures Procedures    Medications Ordered in ED Medications  morphine (PF) 4 MG/ML injection 8 mg (8 mg Intravenous Given 07/12/22 0916)  ondansetron (ZOFRAN) injection  4 mg (4 mg Intravenous Given 07/12/22 0915)  iohexol (OMNIPAQUE) 300 MG/ML solution 100 mL (100 mLs Intravenous Contrast Given 07/12/22 0938)  cephALEXin (KEFLEX) capsule 500 mg (500 mg Oral Given 07/12/22 1041)    ED Course/ Medical Decision Making/ A&P Clinical Course as of 07/12/22 1045  Fri Jul 12, 2022  1040 CT ABDOMEN PELVIS W CONTRAST CT independently reviewed.  No evidence of perforation.  No evidence of significant edema/fat stranding in the right lower quadrant.  Radiology read is negative for acute appendicitis, cholecystitis, gallstones or diverticulitis.  Patient will be treated as a UTI.  I reviewed patient's records, it appears that in May she was treated with Macrobid.  She was then switched to a cephalosporin because of persistent symptoms.  We will give patient  Keflex.  Stable for discharge. [AN]    Clinical Course User Index [AN] Varney Biles, MD                           Medical Decision Making Amount and/or Complexity of Data Reviewed Labs: ordered. Radiology: ordered. Decision-making details documented in ED Course.  Risk Prescription drug management.   71 year old female comes in with chief complaint of abdominal pain.  She has history of total abdominal hysterectomy, hypertension and CHF.  It also appears that she has had right-sided abdominal pain in the past.  Differential diagnosis for this pain includes appendicitis, intra-abdominal abscess, diverticulitis, less likely kidney stone, pyelonephritis.  On exam, patient has right lower quadrant tenderness with no rebound or guarding.  Appropriate labs and CT abdomen and pelvis ordered.  Patient is also complaining of burning with urination, therefore we do think she has cystitis.    Final Clinical Impression(s) / ED Diagnoses Final diagnoses:  Cystitis  Acute abdominal pain    Rx / DC Orders ED Discharge Orders          Ordered    cephALEXin (KEFLEX) 500 MG capsule  3 times daily        07/12/22 1042    acetaminophen (TYLENOL) 500 MG tablet  Every 6 hours PRN        07/12/22 1042    oxyCODONE-acetaminophen (PERCOCET/ROXICET) 5-325 MG tablet  Every 12 hours PRN        07/12/22 1044              Varney Biles, MD 07/12/22 2119    Varney Biles, MD 07/12/22 1045

## 2022-07-22 DIAGNOSIS — R197 Diarrhea, unspecified: Secondary | ICD-10-CM | POA: Diagnosis not present

## 2022-07-22 DIAGNOSIS — K449 Diaphragmatic hernia without obstruction or gangrene: Secondary | ICD-10-CM | POA: Diagnosis not present

## 2022-07-22 DIAGNOSIS — R1031 Right lower quadrant pain: Secondary | ICD-10-CM | POA: Diagnosis not present

## 2022-07-22 DIAGNOSIS — Z79899 Other long term (current) drug therapy: Secondary | ICD-10-CM | POA: Diagnosis not present

## 2022-07-22 DIAGNOSIS — Z86010 Personal history of colonic polyps: Secondary | ICD-10-CM | POA: Diagnosis not present

## 2022-07-23 DIAGNOSIS — N3946 Mixed incontinence: Secondary | ICD-10-CM | POA: Diagnosis not present

## 2022-07-23 DIAGNOSIS — R1030 Lower abdominal pain, unspecified: Secondary | ICD-10-CM | POA: Diagnosis not present

## 2022-07-23 DIAGNOSIS — R300 Dysuria: Secondary | ICD-10-CM | POA: Diagnosis not present

## 2022-07-26 DIAGNOSIS — R197 Diarrhea, unspecified: Secondary | ICD-10-CM | POA: Diagnosis not present

## 2022-08-04 ENCOUNTER — Other Ambulatory Visit (HOSPITAL_BASED_OUTPATIENT_CLINIC_OR_DEPARTMENT_OTHER): Payer: Self-pay | Admitting: Internal Medicine

## 2022-08-04 DIAGNOSIS — R1031 Right lower quadrant pain: Secondary | ICD-10-CM

## 2022-09-08 ENCOUNTER — Other Ambulatory Visit (HOSPITAL_BASED_OUTPATIENT_CLINIC_OR_DEPARTMENT_OTHER): Payer: Self-pay | Admitting: Nurse Practitioner

## 2022-09-08 DIAGNOSIS — E559 Vitamin D deficiency, unspecified: Secondary | ICD-10-CM

## 2022-09-08 DIAGNOSIS — M6281 Muscle weakness (generalized): Secondary | ICD-10-CM

## 2022-09-09 ENCOUNTER — Telehealth: Payer: Self-pay | Admitting: Internal Medicine

## 2022-09-09 NOTE — Telephone Encounter (Signed)
Refill request not sure if the pt. Was to remain on this, last checked 3/23 last apt 02/19/22

## 2022-09-09 NOTE — Telephone Encounter (Signed)
Left detailed message for pt to call back to schedule an appt

## 2022-09-09 NOTE — Telephone Encounter (Signed)
-----   Message from Patience Musca sent at 09/09/2022  2:01 PM EST ----- Pt need cpe with fasting labs. Thanks, Beverlee Nims

## 2022-09-16 DIAGNOSIS — R1030 Lower abdominal pain, unspecified: Secondary | ICD-10-CM | POA: Diagnosis not present

## 2022-09-16 DIAGNOSIS — R140 Abdominal distension (gaseous): Secondary | ICD-10-CM | POA: Diagnosis not present

## 2022-09-16 DIAGNOSIS — Z6841 Body Mass Index (BMI) 40.0 and over, adult: Secondary | ICD-10-CM | POA: Diagnosis not present

## 2022-09-16 DIAGNOSIS — K5904 Chronic idiopathic constipation: Secondary | ICD-10-CM | POA: Diagnosis not present

## 2022-10-11 ENCOUNTER — Encounter: Payer: Self-pay | Admitting: Nurse Practitioner

## 2022-10-11 ENCOUNTER — Ambulatory Visit (INDEPENDENT_AMBULATORY_CARE_PROVIDER_SITE_OTHER): Payer: Medicare PPO | Admitting: Nurse Practitioner

## 2022-10-11 VITALS — BP 128/80 | HR 66 | Ht 66.0 in | Wt 317.6 lb

## 2022-10-11 DIAGNOSIS — Z23 Encounter for immunization: Secondary | ICD-10-CM

## 2022-10-11 DIAGNOSIS — R1011 Right upper quadrant pain: Secondary | ICD-10-CM | POA: Diagnosis not present

## 2022-10-11 DIAGNOSIS — B379 Candidiasis, unspecified: Secondary | ICD-10-CM | POA: Insufficient documentation

## 2022-10-11 DIAGNOSIS — J3489 Other specified disorders of nose and nasal sinuses: Secondary | ICD-10-CM

## 2022-10-11 DIAGNOSIS — Z0000 Encounter for general adult medical examination without abnormal findings: Secondary | ICD-10-CM

## 2022-10-11 DIAGNOSIS — D239 Other benign neoplasm of skin, unspecified: Secondary | ICD-10-CM | POA: Insufficient documentation

## 2022-10-11 DIAGNOSIS — R1032 Left lower quadrant pain: Secondary | ICD-10-CM

## 2022-10-11 DIAGNOSIS — K760 Fatty (change of) liver, not elsewhere classified: Secondary | ICD-10-CM

## 2022-10-11 DIAGNOSIS — R2689 Other abnormalities of gait and mobility: Secondary | ICD-10-CM | POA: Diagnosis not present

## 2022-10-11 DIAGNOSIS — R1031 Right lower quadrant pain: Secondary | ICD-10-CM | POA: Diagnosis not present

## 2022-10-11 DIAGNOSIS — G8929 Other chronic pain: Secondary | ICD-10-CM

## 2022-10-11 DIAGNOSIS — I700 Atherosclerosis of aorta: Secondary | ICD-10-CM

## 2022-10-11 DIAGNOSIS — E890 Postprocedural hypothyroidism: Secondary | ICD-10-CM

## 2022-10-11 DIAGNOSIS — R32 Unspecified urinary incontinence: Secondary | ICD-10-CM

## 2022-10-11 DIAGNOSIS — N3281 Overactive bladder: Secondary | ICD-10-CM | POA: Diagnosis not present

## 2022-10-11 DIAGNOSIS — K219 Gastro-esophageal reflux disease without esophagitis: Secondary | ICD-10-CM

## 2022-10-11 DIAGNOSIS — R0989 Other specified symptoms and signs involving the circulatory and respiratory systems: Secondary | ICD-10-CM

## 2022-10-11 DIAGNOSIS — I10 Essential (primary) hypertension: Secondary | ICD-10-CM

## 2022-10-11 DIAGNOSIS — Z85850 Personal history of malignant neoplasm of thyroid: Secondary | ICD-10-CM

## 2022-10-11 DIAGNOSIS — R062 Wheezing: Secondary | ICD-10-CM

## 2022-10-11 DIAGNOSIS — Z86711 Personal history of pulmonary embolism: Secondary | ICD-10-CM

## 2022-10-11 DIAGNOSIS — R600 Localized edema: Secondary | ICD-10-CM

## 2022-10-11 HISTORY — DX: Candidiasis, unspecified: B37.9

## 2022-10-11 LAB — POCT URINALYSIS DIP (CLINITEK)
Bilirubin, UA: NEGATIVE
Blood, UA: NEGATIVE
Glucose, UA: NEGATIVE mg/dL
Ketones, POC UA: NEGATIVE mg/dL
Leukocytes, UA: NEGATIVE
Nitrite, UA: NEGATIVE
POC PROTEIN,UA: NEGATIVE
Spec Grav, UA: 1.01 (ref 1.010–1.025)
Urobilinogen, UA: 0.2 E.U./dL
pH, UA: 6.5 (ref 5.0–8.0)

## 2022-10-11 LAB — LIPID PANEL

## 2022-10-11 MED ORDER — FLUCONAZOLE 150 MG PO TABS: ORAL_TABLET | ORAL | 2 refills | Status: DC

## 2022-10-11 MED ORDER — MONTELUKAST SODIUM 10 MG PO TABS: 10.0000 mg | ORAL_TABLET | Freq: Every day | ORAL | 3 refills | Status: DC

## 2022-10-11 MED ORDER — PANTOPRAZOLE SODIUM 40 MG PO TBEC: 40.0000 mg | DELAYED_RELEASE_TABLET | Freq: Every day | ORAL | 3 refills | Status: DC

## 2022-10-11 MED ORDER — RIVAROXABAN 20 MG PO TABS: 20.0000 mg | ORAL_TABLET | Freq: Every day | ORAL | 3 refills | Status: DC

## 2022-10-11 MED ORDER — ZOSTER VAC RECOMB ADJUVANTED 50 MCG/0.5ML IM SUSR: 0.5000 mL | Freq: Once | INTRAMUSCULAR | 1 refills | Status: AC

## 2022-10-11 MED ORDER — NYSTATIN 100000 UNIT/GM EX CREA: 1.0000 | TOPICAL_CREAM | Freq: Two times a day (BID) | CUTANEOUS | 0 refills | Status: DC

## 2022-10-11 MED ORDER — TROSPIUM CHLORIDE ER 60 MG PO CP24: 1.0000 | ORAL_CAPSULE | Freq: Every day | ORAL | 1 refills | Status: DC

## 2022-10-11 NOTE — Patient Instructions (Addendum)
     You were recently referred to our medical weight management program by your doctor.   [facility] focusses on improving your overall health through customized nutritional education, exercise, and weight loss.  We also address chronic weight related diseases such as, high blood pressure, high cholesterol, prediabetes, diabetes, joint pain, and many other chronic health conditions.  We believe in using regular every day healthy foods and do not rely on expensive shakes, bars, prepackaged foods, or other supplements.  We accept most insurance plans.   If you would like to join the program, we would love to get you in for a consultation with one of our providers. Please give the office a call so we can go ahead and get you scheduled [phone].   [facility] [address], [place] [address]  The above information is from [facility]. Go ahead and give them a call to set up an appt.  Listen for a call about the ultrasound.  I sent the referral to [facility] I sent the referral for the [facility]

## 2022-10-11 NOTE — Assessment & Plan Note (Signed)
-   Noted 5-pound weight loss and improved constipation with current regimen. Plan:   - Advise ongoing adherence to a nutritious diet and regular physical activity.  - Patient encouraged to reach out to  [facility] to set up appointment. Information provided on AVS.

## 2022-10-11 NOTE — Addendum Note (Signed)
Addended by: Chee Kinslow, Clarise Cruz E on: 10/11/2022 08:25 PM   Modules accepted: Orders

## 2022-10-11 NOTE — Assessment & Plan Note (Signed)
Recent diagnosis with GI at [facility]. Patient's concerns regarding possible pancreatitis and fatty liver acknowledged. No alarm symptoms present today.  Plan:   - Continue surveillance of liver function tests and pancreatic enzymes.   - Suggest a diet low in fats and encourage weight management to ameliorate fatty liver condition. - Will request records to review diagnoses and recommendations.

## 2022-10-11 NOTE — Assessment & Plan Note (Signed)
>>  ASSESSMENT AND PLAN FOR OAB (OVERACTIVE BLADDER) WRITTEN ON 10/11/2022  6:26 PM BY Andrina Locken E, NP  Reports of nocturnal urinary frequency and reliance on multiple pads. She is interested in other options that may be helpful for improved control. Discussion on botox injections, pessary, and pelvic pt with patient today. She is holding quite a bit of fluid in her lower extremities, which is likely contributing to nocturia.  Plan:   - Trial of Lasix  in the morning to decrease nocturnal urinary frequency.   - Explore Botox injections for urinary incontinence with urology consultation.   - Vigilance for urinary tract infection indicators.

## 2022-10-11 NOTE — Assessment & Plan Note (Signed)
Fullness in throat reported, similar to when cancer was diagnosed. She expresses concerns for cancer recurrence.  PLA: - Ultrasound of thyroid area for evaluation - Monitor hormone levels.

## 2022-10-11 NOTE — Assessment & Plan Note (Signed)
Intermittent very brief chest squeezing reported, absent additional symptoms. No symptoms today Plan:   - Observe for symptom progression or new associated symptoms.   - Contemplate further diagnostic work-up if symptoms persist or escalate.

## 2022-10-11 NOTE — Assessment & Plan Note (Signed)
Chronic right sided groin pain of unknown etiology. Workup with ortho, GI, and GYN have been unrevealing. GI suggested possible vascular cause and recommended CT. PLAN: - Follow-up with GI for CT scan  - Continue to monitor for progression or new associated symptoms and report promptly.

## 2022-10-11 NOTE — Progress Notes (Signed)
[facility] [address]  [place], [place]  [address] [place] VISIT  10/11/2022  Subjective:  SMT. Natasha [name] is a 72 y.o. female patient of Alexine Pilant, Coralee Pesa, NP who had a Medicare Annual Wellness Visit today. Keyia is Retired and lives alone. she has 3 children. she reports that she is socially active and does interact with friends/family regularly. she is minimally physically active.  The patient presents with concerns about persistent issues, including weight, bladder problems, pancreatitis, and fatty liver. She reports a recent weight loss of 5 pounds and good blood pressure. The patient has been taking probiotics and Metamucil for constipation, which has provided some relief. She experiences chronic pain in her right side and is unsure if it is related to her bladder or gut. The patient continues to have problems with frequent urination at night and has not yet had a scheduled CT scan for blood vessels in her gut.  The patient has noticed spots on her breast and stomach that have been growing larger over the past two years. She has also been experiencing difficulty swallowing and is taking Montelukast for this issue. The patient expresses concern due to a history of thyroid cancer in 2010 with similar symptoms. She occasionally feels like she has a bladder infection, but tests have come back negative. The patient reports poor sleep quality due to frequent urination and daytime fatigue.  The patient is considering trying compression stockings and Botox injections for urinary incontinence. She is not currently taking Lasix due to frequent urination but is open to trying it in the morning to flush out fluids during the day. The patient reports using multiple pads at night to manage her incontinence.  The patient has a goal of attending the [place] without the use of a walker and is  interested in obtaining a prescription for physical therapy. She is also interested in joining a wellness program to help with weight loss and overall health. The patient experiences occasional chest squeezing, cold and numb hands, and has no feeling in her fingers. She has noticed new smelly areas under her breasts, which appear to be yeast growths. The patient has a history of thyroid cancer and is interested in having an ultrasound on her neck to ensure her thyroid is functioning properly.  Patient Care Team: Tilak Oakley, Coralee Pesa, NP as PCP - General (Nurse Practitioner) Buford Dresser, MD as PCP - Cardiology (Cardiology)     10/11/2022    1:47 PM 07/12/2022    8:01 AM 05/22/2021    2:30 PM 12/06/2020    2:43 PM 11/10/2020    3:18 PM 10/27/2020    4:43 PM 08/23/2019    2:16 AM  Advanced Directives  Does Patient Have a Medical Advance Directive? No No No No No No No  Would patient like information on creating a medical advance directive? No - Patient declined No - Patient declined  No - Patient declined No - Patient declined  No - Patient declined    Hospital Utilization Over the Past 12 Months: # of hospitalizations or ER visits: 0 # of surgeries: 0  Review of Systems    Patient reports that her overall health is unchanged when compared to last year.  Review of Systems: General ROS: positive for  - sleep disturbance and weight gain Endocrine ROS: positive for - fullness in throat where thyroid gland would be located.  Cardiovascular ROS: positive  for - intermittent squeeze sensation in chest lasting 1 second with no associated symptoms.  Genito-Urinary ROS: positive for - incontinence, nocturia, and urinary frequency/urgency Musculoskeletal ROS: positive for - gait disturbance, joint pain, joint stiffness, and muscular weakness GI: positive for ongoing pain in the groin on the left side.  Numbness and coldness of hands  All other systems negative.  Pain Assessment  0/10      Current Medications & Allergies (verified) Allergies as of 10/11/2022       Reactions   Codeine Shortness Of Breath, Palpitations, Other (See Comments)   Eggs Or Egg-derived Products Nausea And Vomiting   PROJECTILE VOMITING        Medication List        Accurate as of October 11, 2022  6:43 PM. If you have any questions, ask your nurse or doctor.          STOP taking these medications    celecoxib 100 MG capsule Commonly known as: CeleBREX Stopped by: Orma Render, NP   cephALEXin 500 MG capsule Commonly known as: KEFLEX Stopped by: Orma Render, NP   GaviLyte-G 236 g solution Generic drug: polyethylene glycol Stopped by: Orma Render, NP   oxyCODONE-acetaminophen 5-325 MG tablet Commonly known as: PERCOCET/ROXICET Stopped by: Orma Render, NP   Spikevax syringe Generic drug: COVID-19 mRNA vaccine 2023-2024 Stopped by: Orma Render, NP   sulfamethoxazole-trimethoprim 800-160 MG tablet Commonly known as: BACTRIM DS Stopped by: Orma Render, NP       TAKE these medications    acetaminophen 500 MG tablet Commonly known as: TYLENOL Take 1 tablet (500 mg total) by mouth every 6 (six) hours as needed.   amLODipine 5 MG tablet Commonly known as: NORVASC TAKE 1 TABLET (5 MG TOTAL) BY MOUTH AT BEDTIME. FOR BLOOD PRESSURE   Arexvy 120 MCG/0.5ML injection Generic drug: RSV vaccine recomb adjuvanted Inject 0.5 mLs into the muscle once.   dicyclomine 10 MG capsule Commonly known as: BENTYL Take 10 mg by mouth 4 (four) times daily -  before meals and at bedtime.   fluconazole 150 MG tablet Commonly known as: DIFLUCAN Take one tablet by mouth at the first sign of symptoms of yeast. If no resolution, repeat dose in 72 hours.   furosemide 20 MG tablet Commonly known as: LASIX Take 1 tablet (20 mg total) by mouth daily.   Krill Oil 1000 MG Caps Take 1,000 mg by mouth 2 (two) times daily.   Linzess 145 MCG Caps capsule Generic drug:  linaclotide Take 145 mcg by mouth daily before breakfast.   montelukast 10 MG tablet Commonly known as: SINGULAIR Take 1 tablet (10 mg total) by mouth at bedtime.   multivitamin with minerals Tabs tablet Take 1 tablet by mouth daily.   nystatin cream Commonly known as: MYCOSTATIN Apply 1 Application topically 2 (two) times daily. Started by: Orma Render, NP   pantoprazole 40 MG tablet Commonly known as: PROTONIX Take 1 tablet (40 mg total) by mouth 2 (two) times daily.   rivaroxaban 20 MG Tabs tablet Commonly known as: XARELTO Take 1 tablet (20 mg total) by mouth daily with supper.   Synthroid 150 MCG tablet Generic drug: levothyroxine Take 19mg by mouth 5 days a week and 748m by mouth 2 days a week.   Trospium Chloride 60 MG Cp24 Take 1 capsule by mouth daily at 12 noon.   valsartan 160 MG tablet Commonly known as: Diovan Take 1 tablet (160 mg total)  by mouth daily.   Vitamin D (Ergocalciferol) 1.25 MG (50000 UNIT) Caps capsule Commonly known as: DRISDOL TAKE 1 CAPSULE (50,000 UNITS TOTAL) BY MOUTH EVERY 7 (SEVEN) DAYS. TAKE FOR 8 TOTAL DOSES(WEEKS)   Zoster Vaccine Adjuvanted injection Commonly known as: SHINGRIX Inject 0.5 mLs into the muscle once for 1 dose. Repeat in 2-6 months. Please fax confirmation of vaccination to Worthy Keeler, DNP at [phone] Started by: Orma Render, NP        History (reviewed): Past Medical History:  Diagnosis Date   Abdominal spasms 12/14/2020   Acute pulmonary embolism ([place]) 08/21/2019   Bacterial conjunctivitis of both eyes 12/14/2020   Bilateral leg edema 02/10/2020   Bradycardia    Breast cancer screening by mammogram 11/24/2020   CARCINOMA, THYROID GLAND, PAPILLARY 05/04/2008   Stage 2, 8/09: thyroidectomy for 2.7cm papillary adenocarcinoma (t2 n0 mo) 9/09: I-131 rx, 108 [facility] 05/10: tg is neg (ab neg) , total body scan is neg   Colon cancer screening 11/24/2020   Colon polyps    Complication of anesthesia     trouble with airway   Cough 12/25/2007   Qualifier: Diagnosis of  By: Doy Mince LPN, Megan     [id number] 08/19/2019   Diverticulosis    DVT (deep venous thrombosis) ([place])    Edema 12/22/2008   Encounter to establish care 11/24/2020   History of 2019 novel coronavirus disease (COVID-19) 11/09/2019   HYPERTENSION 12/25/2007   HYPOTHYROIDISM, POSTSURGICAL 05/04/2008   LEG PAIN, LEFT 12/09/2008   Lumbago with sciatica, right side 01/08/2021   Memory loss due to medical condition 11/09/2019   Muscle weakness    Nausea and vomiting 05/23/2008   Qualifier: Diagnosis of  By: Loanne Drilling MD, Jacelyn Pi   Formatting of this note might be different from the original. Qualifier: Diagnosis of  By: Loanne Drilling MD, Sean A   Numbness and tingling    OSA (obstructive sleep apnea) 06/15/2013   CPAP   Paresthesia 01/04/2020   Peripheral edema 12/22/2008   Qualifier: Diagnosis of  By: Johnsie Cancel, MD, Rona Ravens    Pneumonia due to COVID-19 virus    Pulmonary embolism ([place])    Pulmonary embolism ([place]) 08/23/2019   Right lower quadrant pain 11/24/2020   Sciatica of left side 12/04/2011   Skin sensation disturbance 12/05/2008   Qualifier: Diagnosis of  By: Asa Lente MD, Serita Grit of this note might be different from the original. Qualifier: Diagnosis of  By: Asa Lente MD, Mateo Flow A   Spasm of muscle of lower back 12/14/2020   Vaginal candidiasis 03/16/2021   Weakness 01/04/2020   Past Surgical History:  Procedure Laterality Date   ABDOMINAL HYSTERECTOMY     due to bleeding, ovaries remain   ANKLE SURGERY Right    CERVICAL FUSION     x 2   COLONOSCOPY WITH PROPOFOL N/A 08/08/2015   Procedure: COLONOSCOPY WITH PROPOFOL;  Surgeon: Juanita Craver, MD;  Location: [facility] ENDOSCOPY;  Service: Endoscopy;  Laterality: N/A;   KNEE SURGERY Left    TOTAL THYROIDECTOMY     Family History  Problem Relation Age of Onset   Heart disease Mother    Kidney disease Mother    Other Father        unsure of medical history   Social History    Socioeconomic History   Marital status: Widowed    Spouse name: Not on file   Number of children: 3   Years of education: college   Highest education level: Master's  degree (e.g., MA, MS, MEng, MEd, MSW, MBA)  Occupational History   Occupation: Teacher - [employer]  Tobacco Use   Smoking status: Never   Smokeless tobacco: Never  Vaping Use   Vaping Use: Never used  Substance and Sexual Activity   Alcohol use: Never    Comment: 1 every 1-2 months   Drug use: No   Sexual activity: Not Currently  Other Topics Concern   Not on file  Social History Narrative   Lives alone.   Right-handed.   No daily caffeine use.   Social Determinants of Health   Financial Resource Strain: Not on file  Food Insecurity: Not on file  Transportation Needs: Not on file  Physical Activity: Not on file  Stress: Not on file  Social Connections: Not on file    Activities of Daily Living    10/11/2022    1:48 PM 02/19/2022   10:53 AM  In your present state of health, do you have any difficulty performing the following activities:  Hearing? 1 0  Comment just a little bit   Vision? 1 0  Comment cataracts   Difficulty concentrating or making decisions? 0 0  Walking or climbing stairs? 1 0  Comment lower back   Dressing or bathing? 0 0  Doing errands, shopping? 0 0  Preparing Food and eating ? N   Using the Toilet? N   In the past six months, have you accidently leaked urine? Y   Do you have problems with loss of bowel control? N   Managing your Medications? N   Managing your Finances? N   Housekeeping or managing your Housekeeping? N     Patient Education/Literacy    Exercise Current Exercise Habits: Structured exercise class, Type of exercise: Other - see comments (swimming but not lately), Time (Minutes): 30, Frequency (Times/Week): 1, Weekly Exercise (Minutes/Week): 30, Exercise limited by: None identified  Diet Patient reports consuming 3 meals a day and 1 snack(s) a  day Patient reports that her primary diet is: Regular Patient reports that she does have regular access to food.   Depression Screen    10/11/2022    1:42 PM 02/19/2022   10:53 AM 01/16/2022   11:02 AM 11/30/2021   10:17 AM 03/16/2021    6:40 PM 01/11/2020   11:15 AM 11/08/2019   11:11 AM  PHQ 2/9 Scores  PHQ - 2 Score 0 0 0 0 0 2 5  PHQ- 9 Score  0       Exception Documentation  Medical reason  Medical reason        Fall Risk    10/11/2022    1:42 PM 02/19/2022   10:52 AM 11/30/2021   10:17 AM 03/16/2021    6:39 PM 01/11/2020   11:15 AM  Fall Risk   Falls in the past year? 0 0 0 0 0  Number falls in past yr: 0 0 0 0 0  Injury with Fall? 0 0 0 0 0  Risk for fall due to : No Fall Risks No Fall Risks No Fall Risks Impaired balance/gait   Follow up Falls evaluation completed Falls evaluation completed        Objective:   BP 128/80   Pulse 66   Ht 5' 6"$  (1.676 m)   Wt (!) 317 lb 9.6 oz (144.1 kg)   SpO2 96%   BMI 51.26 kg/m   Last Weight  Most recent update: 10/11/2022  1:46 PM    Weight  144.1 kg (317 lb 9.6 oz)               Body mass index is 51.26 kg/m.  Hearing/Vision  Baker Janus did not have difficulty with hearing/understanding during the face-to-face interview Chrishonda did not have difficulty with her vision during the face-to-face interview Reports that she has had a formal eye exam by an eye care professional within the past year Reports that she has not had a formal hearing evaluation within the past year  Cognitive Function:     No data to display          Normal Cognitive Function Screening: Yes   Immunization & Health Maintenance Record Immunization History  Administered Date(s) Administered   Fluad Quad(high Dose 65+) 06/30/2022   PFIZER(Purple Top)SARS-COV-2 Vaccination 11/14/2019, 12/06/2019, 07/18/2020   PNEUMOCOCCAL CONJUGATE-20 10/11/2022   Rsv, Bivalent, Protein Subunit Rsvpref,pf [place] Lance) 04/23/2022    Health Maintenance  Topic Date Due    Medicare Annual Wellness (AWV)  Never done   DTaP/Tdap/Td (1 - Tdap) Never done   Zoster Vaccines- Shingrix (1 of 2) Never done   COVID-19 Vaccine (4 - 2023-24 season) 10/27/2022 (Originally 05/03/2022)   DEXA SCAN  10/12/2023 (Originally 09/10/2015)   MAMMOGRAM  11/30/2022   COLONOSCOPY (Pts 45-22yr Insurance coverage will need to be confirmed)  08/07/2025   Pneumonia Vaccine 72 Years old  Completed   INFLUENZA VACCINE  Completed   HPV VACCINES  Aged Out   Hepatitis C Screening  Discontinued       Assessment  This is a routine wellness examination for GJarold Song  Health Maintenance: Due or Overdue Health Maintenance Due  Topic Date Due   Medicare Annual Wellness (AWV)  Never done   DTaP/Tdap/Td (1 - Tdap) Never done   Zoster Vaccines- Shingrix (1 of 2) Never done   Vital signs: Blood pressure within normal range, weight loss of 5 pounds reported. Physical examination findings: - Right-sided chronic pain, unclear if related to bladder or gut. - Spots on breast and stomach, growing larger over the past two years, identified as dermatofibromas. - Trouble swallowing, history of thyroid cancer in 2010. - Frequent urination at night, negative tests for bladder infection. - Yeast growth under breasts. - Chest squeezing sensation, no shortness of breath or dizziness reported. - Cold and numb hands, no other symptoms reported.  Diagnostic tests: - Urine sample to be collected to rule out bladder infection. - Ultrasound of the neck ordered to assess thyroid status. - Lab tests to be performed to check iron levels, platelets, and thyroid function.  GJarold Songdoes not need a referral for Community Assistance: Care Management:   no Social Work:    no Prescription Assistance:  no Nutrition/Diabetes Education:  no   Plan:  Personalized Goals  Goals Addressed               This Visit's Progress     Weight (lb) < 200 lb (90.7 kg) (pt-stated)   317 lb 9.6 oz (144.1 kg)      I want to get started on a wellness plan to help loose weight.       Personalized Health Maintenance & Screening Recommendations  Pneumococcal vaccine  Bone densitometry screening  Lung Cancer Screening Recommended: no (Low Dose CT Chest recommended if Age 72-80years, 30 pack-year currently smoking OR have quit w/in past 15 years) Hepatitis C Screening recommended: no HIV Screening recommended: no  Advanced Directives: Written information was not given per the patient's  request.  Referrals & Orders Orders Placed This Encounter  Procedures   US THYROID   Pneumococcal conjugate vaccine 20-valent (Prevnar 20)   CBC with Differential/Platelet   Comprehensive metabolic panel   Hemoglobin A1c   Lipid panel   T4, free   TSH   VITAMIN D 25 Hydroxy (Vit-D Deficiency, Fractures)   Ambulatory referral to Physical Therapy   Ambulatory referral to [place]   POCT URINALYSIS DIP (CLINITEK)    Follow-up Plan Follow-up with Ladaisha Portillo, Coralee Pesa, NP as planned   I have personally reviewed and noted the following in the patient's chart:   Medical and social history Use of alcohol, tobacco or illicit drugs  Current medications and supplements Functional ability and status Nutritional status Physical activity Advanced directives List of other physicians Hospitalizations, surgeries, and ER visits in previous 12 months Vitals Screenings to include cognitive, depression, and falls Referrals and appointments  In addition, I have reviewed and discussed with patient certain preventive protocols, quality metrics, and best practice recommendations. A written personalized care plan for preventive services as well as general preventive health recommendations were provided to patient.     Orma Render, DNP, AGNP-c   10/11/2022

## 2022-10-11 NOTE — Assessment & Plan Note (Signed)
Reports of nocturnal urinary frequency and reliance on multiple pads. She is interested in other options that may be helpful for improved control. Discussion on botox injections, pessary, and pelvic pt with patient today. She is holding quite a bit of fluid in her lower extremities, which is likely contributing to nocturia.  Plan:   - Trial of Lasix in the morning to decrease nocturnal urinary frequency.   - Explore Botox injections for urinary incontinence with urology consultation.   - Vigilance for urinary tract infection indicators.

## 2022-10-11 NOTE — Assessment & Plan Note (Signed)
Difficulty swallowing and concern for thyroid cancer recurrence expressed. Exam benign.  Plan:   - Schedule neck ultrasound to inspect thyroid bed.   - Monitor thyroid hormone levels for medication management.

## 2022-10-11 NOTE — Assessment & Plan Note (Signed)
Erythema and color changes in submammary area with yeast odor present. Discussion with patient of diagnosis of candidal infection.  PLAN: - Nystatin cream applied topically to affected area twice daily - Keep area under breasts clean and dry - Oral diflucan provided given the extent of the infection

## 2022-10-11 NOTE — Assessment & Plan Note (Signed)
Patient observes enlarging spots on breast and stomach. Exam shows Brown, firm slightly raised papules less than 0.5cm. Consistent with dermatofibroma. Plan:     - Provide assurance on the benign nature of dermatofibromas; offered dermatology referral if patient wishes.

## 2022-10-11 NOTE — Assessment & Plan Note (Signed)
Chronic.  No alarm symptoms present at this time.  Goal blood pressure less than less than 130/80.  Refills have been provided today.  Labs today to check electrolytes and kidney function.  Recommend current treatment plan is effective, no change in therapy, reviewed diet, exercise and weight control, labs ordered and recent results reviewed with patient. Currently is followed with cardiology. We will make changes to plan of care as necessary based on lab results. Plan to follow-up in 67month.

## 2022-10-12 LAB — COMPREHENSIVE METABOLIC PANEL
ALT: 11 IU/L (ref 0–32)
AST: 12 IU/L (ref 0–40)
Albumin/Globulin Ratio: 1.6 (ref 1.2–2.2)
Albumin: 4.3 g/dL (ref 3.8–4.8)
Alkaline Phosphatase: 110 IU/L (ref 44–121)
BUN/Creatinine Ratio: 13 (ref 12–28)
BUN: 12 mg/dL (ref 8–27)
Bilirubin Total: 0.4 mg/dL (ref 0.0–1.2)
CO2: 23 mmol/L (ref 20–29)
Calcium: 9.6 mg/dL (ref 8.7–10.3)
Chloride: 102 mmol/L (ref 96–106)
Creatinine, Ser: 0.92 mg/dL (ref 0.57–1.00)
Globulin, Total: 2.7 g/dL (ref 1.5–4.5)
Glucose: 84 mg/dL (ref 70–99)
Potassium: 4.4 mmol/L (ref 3.5–5.2)
Sodium: 139 mmol/L (ref 134–144)
Total Protein: 7 g/dL (ref 6.0–8.5)
eGFR: 66 mL/min/{1.73_m2} (ref >59)

## 2022-10-12 LAB — CBC WITH DIFFERENTIAL/PLATELET
Basophils Absolute: 0.1 10*3/uL (ref 0.0–0.2)
Basos: 1 %
EOS (ABSOLUTE): 0.1 10*3/uL (ref 0.0–0.4)
Eos: 2 %
Hematocrit: 38 % (ref 34.0–46.6)
Hemoglobin: 12.8 g/dL (ref 11.1–15.9)
Immature Grans (Abs): 0 10*3/uL (ref 0.0–0.1)
Immature Granulocytes: 0 %
Lymphocytes Absolute: 1.8 10*3/uL (ref 0.7–3.1)
Lymphs: 25 %
MCH: 29.6 pg (ref 26.6–33.0)
MCHC: 33.7 g/dL (ref 31.5–35.7)
MCV: 88 fL (ref 79–97)
Monocytes Absolute: 0.7 10*3/uL (ref 0.1–0.9)
Monocytes: 10 %
Neutrophils Absolute: 4.5 10*3/uL (ref 1.4–7.0)
Neutrophils: 62 %
Platelets: 249 10*3/uL (ref 150–450)
RBC: 4.32 x10E6/uL (ref 3.77–5.28)
RDW: 13.7 % (ref 11.7–15.4)
WBC: 7.3 10*3/uL (ref 3.4–10.8)

## 2022-10-12 LAB — LIPID PANEL
Chol/HDL Ratio: 2.1 ratio (ref 0.0–4.4)
Cholesterol, Total: 164 mg/dL (ref 100–199)
HDL: 78 mg/dL (ref >39)
LDL Chol Calc (NIH): 76 mg/dL (ref 0–99)
Triglycerides: 46 mg/dL (ref 0–149)
VLDL Cholesterol Cal: 10 mg/dL (ref 5–40)

## 2022-10-12 LAB — VITAMIN D 25 HYDROXY (VIT D DEFICIENCY, FRACTURES): Vit D, 25-Hydroxy: 30.2 ng/mL (ref 30.0–100.0)

## 2022-10-12 LAB — HEMOGLOBIN A1C
Est. average glucose Bld gHb Est-mCnc: 117 mg/dL
Hgb A1c MFr Bld: 5.7 % — ABNORMAL HIGH (ref 4.8–5.6)

## 2022-10-12 LAB — TSH: TSH: 0.72 u[IU]/mL (ref 0.450–4.500)

## 2022-10-12 LAB — T4, FREE: Free T4: 1.79 ng/dL — ABNORMAL HIGH (ref 0.82–1.77)

## 2022-10-18 ENCOUNTER — Telehealth: Payer: Self-pay | Admitting: Nurse Practitioner

## 2022-10-18 NOTE — Telephone Encounter (Signed)
Ultrasound Appointment

## 2022-10-24 ENCOUNTER — Ambulatory Visit (HOSPITAL_COMMUNITY)
Admission: RE | Admit: 2022-10-24 | Discharge: 2022-10-24 | Disposition: A | Discharge status: Home / Self Care | Payer: Medicare PPO | Source: Ambulatory Visit | Attending: Nurse Practitioner | Admitting: Nurse Practitioner

## 2022-10-24 DIAGNOSIS — E890 Postprocedural hypothyroidism: Secondary | ICD-10-CM | POA: Diagnosis not present

## 2022-10-24 DIAGNOSIS — R0989 Other specified symptoms and signs involving the circulatory and respiratory systems: Secondary | ICD-10-CM | POA: Diagnosis not present

## 2022-10-30 DIAGNOSIS — R2689 Other abnormalities of gait and mobility: Secondary | ICD-10-CM | POA: Diagnosis not present

## 2022-11-01 DIAGNOSIS — R2689 Other abnormalities of gait and mobility: Secondary | ICD-10-CM | POA: Diagnosis not present

## 2022-11-04 ENCOUNTER — Encounter: Payer: Self-pay | Admitting: Bariatrics

## 2022-11-04 ENCOUNTER — Ambulatory Visit (INDEPENDENT_AMBULATORY_CARE_PROVIDER_SITE_OTHER): Payer: Medicare PPO | Admitting: Bariatrics

## 2022-11-04 VITALS — BP 135/78 | HR 83 | Temp 97.8°F | Ht 66.0 in | Wt 319.0 lb

## 2022-11-04 DIAGNOSIS — Z6841 Body Mass Index (BMI) 40.0 and over, adult: Secondary | ICD-10-CM | POA: Diagnosis not present

## 2022-11-04 DIAGNOSIS — I10 Essential (primary) hypertension: Secondary | ICD-10-CM

## 2022-11-04 DIAGNOSIS — E668 Other obesity: Secondary | ICD-10-CM | POA: Diagnosis not present

## 2022-11-04 DIAGNOSIS — Z0289 Encounter for other administrative examinations: Secondary | ICD-10-CM

## 2022-11-04 DIAGNOSIS — K760 Fatty (change of) liver, not elsewhere classified: Secondary | ICD-10-CM

## 2022-11-04 DIAGNOSIS — E65 Localized adiposity: Secondary | ICD-10-CM

## 2022-11-04 NOTE — Progress Notes (Signed)
Office: [phone]  /  Fax: [phone]   Initial Visit  Natasha [name] was seen in clinic today to evaluate for obesity. She is interested in losing weight to improve overall health and reduce the risk of weight related complications. She presents today to review program treatment options, initial physical assessment, and evaluation.     She was referred by: Friend or Family  When asked what else they would like to accomplish? She states: Adopt healthier eating patterns, Improve energy levels and physical activity, Improve existing medical conditions, Reduce number of medications, and Improve quality of life  When asked how has your weight affected you? She states: Having poor endurance  Some associated conditions: Hypertension, Fatty liver disease, and Other: CHF  Contributing factors: Family history and Reduced physical activity  Weight promoting medications identified: Contraceptives or hormonal therapy  Current nutrition plan: None and Other: timed feeding, and portion choice.   Current level of physical activity: None and Other: PT  Current or previous pharmacotherapy: None  Response to medication: Never tried medications   Past medical history includes:   Past Medical History:  Diagnosis Date   Abdominal spasms 12/14/2020   Acute pulmonary embolism ([place]) 08/21/2019   Bacterial conjunctivitis of both eyes 12/14/2020   Bilateral leg edema 02/10/2020   Bradycardia    Breast cancer screening by mammogram 11/24/2020   CARCINOMA, THYROID GLAND, PAPILLARY 05/04/2008   Stage 2, 8/09: thyroidectomy for 2.7cm papillary adenocarcinoma (t2 n0 mo) 9/09: I-131 rx, 108 [facility] 05/10: tg is neg (ab neg) , total body scan is neg   Colon cancer screening 11/24/2020   Colon polyps    Complication of anesthesia    trouble with airway   Cough 12/25/2007   Qualifier: Diagnosis of  By: Doy Mince LPN, Megan     [id number] 08/19/2019   Diverticulosis    DVT (deep venous thrombosis) ([place])    Edema  12/22/2008   Encounter to establish care 11/24/2020   History of 2019 novel coronavirus disease (COVID-19) 11/09/2019   HYPERTENSION 12/25/2007   HYPOTHYROIDISM, POSTSURGICAL 05/04/2008   LEG PAIN, LEFT 12/09/2008   Lumbago with sciatica, right side 01/08/2021   Memory loss due to medical condition 11/09/2019   Muscle weakness    Nausea and vomiting 05/23/2008   Qualifier: Diagnosis of  By: Loanne Drilling MD, Jacelyn Pi   Formatting of this note might be different from the original. Qualifier: Diagnosis of  By: Loanne Drilling MD, Hilliard Clark A   Numbness and tingling    OSA (obstructive sleep apnea) 06/15/2013   CPAP   Paresthesia 01/04/2020   Peripheral edema 12/22/2008   Qualifier: Diagnosis of  By: Johnsie Cancel, MD, Rona Ravens    Pneumonia due to COVID-19 virus    Pulmonary embolism ([place])    Pulmonary embolism ([place]) 08/23/2019   Right lower quadrant pain 11/24/2020   Sciatica of left side 12/04/2011   Skin sensation disturbance 12/05/2008   Qualifier: Diagnosis of  By: Asa Lente MD, Serita Grit of this note might be different from the original. Qualifier: Diagnosis of  By: Asa Lente MD, Valerie A   Spasm of muscle of lower back 12/14/2020   Vaginal candidiasis 03/16/2021   Weakness 01/04/2020     Objective:   BP 135/78   Pulse 83   Temp 97.8 F (36.6 C)   Ht '5\' 6"'$  (1.676 m)   Wt (!) 319 lb (144.7 kg)   SpO2 96%   BMI 51.49 kg/m  She was weighed on the bioimpedance scale:  Body mass index is 51.49 kg/m.  Peak Weight:322 lbs  , Body Fat%:62.9%, Visceral Fat Rating:26, Weight trend over the last 12 months: Increasing  General:  Alert, oriented and cooperative. Patient is in no acute distress.  Respiratory: Normal respiratory effort, no problems with respiration noted  Extremities: Normal range of motion.    Mental Status: Normal mood and affect. Normal behavior. Normal judgment and thought content.   DIAGNOSTIC DATA REVIEWED:  BMET    Component Value Date/Time   NA 139 10/11/2022 1458   K 4.4  10/11/2022 1458   CL 102 10/11/2022 1458   CO2 23 10/11/2022 1458   GLUCOSE 84 10/11/2022 1458   GLUCOSE 96 07/12/2022 0850   BUN 12 10/11/2022 1458   CREATININE 0.92 10/11/2022 1458   CREATININE 0.74 12/31/2015 1145   CALCIUM 9.6 10/11/2022 1458   GFRNONAA >60 07/12/2022 0850   GFRAA 76 12/16/2019 1037   Lab Results  Component Value Date   HGBA1C 5.7 (H) 10/11/2022   HGBA1C 5.1 03/19/2021   No results found for: "INSULIN" CBC    Component Value Date/Time   WBC 7.3 10/11/2022 1458   WBC 7.8 07/12/2022 0850   RBC 4.32 10/11/2022 1458   RBC 4.85 07/12/2022 0850   HGB 12.8 10/11/2022 1458   HCT 38.0 10/11/2022 1458   PLT 249 10/11/2022 1458   MCV 88 10/11/2022 1458   MCH 29.6 10/11/2022 1458   MCH 29.1 07/12/2022 0850   MCHC 33.7 10/11/2022 1458   MCHC 32.3 07/12/2022 0850   RDW 13.7 10/11/2022 1458   Iron/TIBC/Ferritin/ %Sat    Component Value Date/Time   FERRITIN 178 09/05/2019 0500   Lipid Panel     Component Value Date/Time   CHOL 164 10/11/2022 1458   TRIG 46 10/11/2022 1458   HDL 78 10/11/2022 1458   CHOLHDL 2.1 10/11/2022 1458   CHOLHDL 2.3 09/04/2019 1230   VLDL 18 09/04/2019 1230   LDLCALC 76 10/11/2022 1458   Hepatic Function Panel     Component Value Date/Time   PROT 7.0 10/11/2022 1458   ALBUMIN 4.3 10/11/2022 1458   AST 12 10/11/2022 1458   ALT 11 10/11/2022 1458   ALKPHOS 110 10/11/2022 1458   BILITOT 0.4 10/11/2022 1458   BILIDIR 0.1 05/23/2008 1721      Component Value Date/Time   TSH 0.720 10/11/2022 1458     Assessment and Plan:    Visceral Obesity.   She has a visceral fat rating of 26 per the bio-impedence scale.   Plan: The goal is a visceral fat rating of 13 or below.  Will work on the plan and increase exercise/begin exercise.  Information sheet on " Tips to lose belly fat ". Aware that belly fat may be equate to visceral fat, but many of the same tips can help both subcutaneous and visceral fat.  Information sheet on  " Healthy and Unhealthy fats.  Will minimize all carbohydrates ( sweets and starches ).    Fatty liver  She was diagnosed with NAFLD/fatty liver disease.Liver enzymes are normal.  Will work on diet and exercise.   Hypertension Hypertension stable.  Medication(s): See medication list.   BP Readings from Last 3 Encounters:  11/04/22 135/78  10/11/22 128/80  07/12/22 (!) 142/76   Lab Results  Component Value Date   CREATININE 0.92 10/11/2022   CREATININE 0.89 07/12/2022   CREATININE 0.88 01/25/2022   Last GFR greater than 60.   Plan: 1.Continue all antihypertensives at current dosages. 2. No  added salt.      Obesity Treatment / Action Plan:  Patient will work on garnering support from family and friends to begin weight loss journey. Will work on eliminating or reducing the presence of highly palatable, calorie dense foods in the home. Will complete provided nutritional and psychosocial assessment questionnaire before the next appointment. Will be scheduled for indirect calorimetry to determine resting energy expenditure in a fasting state.  This will allow [place] to create a reduced calorie, high-protein meal plan to promote loss of fat mass while preserving muscle mass.  Obesity Education Performed Today:  She was weighed on the bioimpedance scale and results were discussed and documented in the synopsis.  We discussed obesity as a disease and the importance of a more detailed evaluation of all the factors contributing to the disease.  We discussed the importance of long term lifestyle changes which include nutrition, exercise and behavioral modifications as well as the importance of customizing this to her specific health and social needs.  We discussed the benefits of reaching a healthier weight to alleviate the symptoms of existing conditions and reduce the risks of the biomechanical, metabolic and psychological effects of obesity.  Natasha [name] appears to be in the  action stage of change and states they are ready to start intensive lifestyle modifications and behavioral modifications.  34 minutes was spent today on this visit including the above counseling, pre-visit chart review, and post-visit documentation.  Reviewed by clinician on day of visit: allergies, medications, problem list, medical history, surgical history, family history, social history, and previous encounter notes.    Henritta Mutz A. Ruben ImO.

## 2022-11-06 DIAGNOSIS — R2689 Other abnormalities of gait and mobility: Secondary | ICD-10-CM | POA: Diagnosis not present

## 2022-11-08 DIAGNOSIS — R2689 Other abnormalities of gait and mobility: Secondary | ICD-10-CM | POA: Diagnosis not present

## 2022-11-13 DIAGNOSIS — R2689 Other abnormalities of gait and mobility: Secondary | ICD-10-CM | POA: Diagnosis not present

## 2022-11-15 DIAGNOSIS — R2689 Other abnormalities of gait and mobility: Secondary | ICD-10-CM | POA: Diagnosis not present

## 2022-11-20 DIAGNOSIS — R2689 Other abnormalities of gait and mobility: Secondary | ICD-10-CM | POA: Diagnosis not present

## 2022-11-22 DIAGNOSIS — R2689 Other abnormalities of gait and mobility: Secondary | ICD-10-CM | POA: Diagnosis not present

## 2022-11-26 ENCOUNTER — Ambulatory Visit (INDEPENDENT_AMBULATORY_CARE_PROVIDER_SITE_OTHER): Payer: Medicare PPO | Admitting: Bariatrics

## 2022-11-26 ENCOUNTER — Encounter: Payer: Self-pay | Admitting: Bariatrics

## 2022-11-26 VITALS — BP 156/83 | HR 60 | Temp 97.8°F | Ht 66.0 in | Wt 317.0 lb

## 2022-11-26 DIAGNOSIS — E890 Postprocedural hypothyroidism: Secondary | ICD-10-CM | POA: Diagnosis not present

## 2022-11-26 DIAGNOSIS — R5383 Other fatigue: Secondary | ICD-10-CM

## 2022-11-26 DIAGNOSIS — Z0000 Encounter for general adult medical examination without abnormal findings: Secondary | ICD-10-CM

## 2022-11-26 DIAGNOSIS — Z1331 Encounter for screening for depression: Secondary | ICD-10-CM

## 2022-11-26 DIAGNOSIS — R7303 Prediabetes: Secondary | ICD-10-CM | POA: Insufficient documentation

## 2022-11-26 DIAGNOSIS — I10 Essential (primary) hypertension: Secondary | ICD-10-CM | POA: Diagnosis not present

## 2022-11-26 DIAGNOSIS — K760 Fatty (change of) liver, not elsewhere classified: Secondary | ICD-10-CM | POA: Diagnosis not present

## 2022-11-26 DIAGNOSIS — E559 Vitamin D deficiency, unspecified: Secondary | ICD-10-CM | POA: Diagnosis not present

## 2022-11-26 DIAGNOSIS — E66813 Obesity, class 3: Secondary | ICD-10-CM | POA: Insufficient documentation

## 2022-11-26 DIAGNOSIS — R0602 Shortness of breath: Secondary | ICD-10-CM | POA: Diagnosis not present

## 2022-11-26 DIAGNOSIS — Z6841 Body Mass Index (BMI) 40.0 and over, adult: Secondary | ICD-10-CM

## 2022-11-26 DIAGNOSIS — R7309 Other abnormal glucose: Secondary | ICD-10-CM | POA: Diagnosis not present

## 2022-11-26 DIAGNOSIS — G4733 Obstructive sleep apnea (adult) (pediatric): Secondary | ICD-10-CM | POA: Diagnosis not present

## 2022-11-26 DIAGNOSIS — E538 Deficiency of other specified B group vitamins: Secondary | ICD-10-CM

## 2022-11-26 HISTORY — DX: Encounter for general adult medical examination without abnormal findings: Z00.00

## 2022-11-26 HISTORY — DX: Other fatigue: R53.83

## 2022-11-27 ENCOUNTER — Encounter (INDEPENDENT_AMBULATORY_CARE_PROVIDER_SITE_OTHER): Payer: Self-pay | Admitting: Bariatrics

## 2022-11-27 DIAGNOSIS — R7303 Prediabetes: Secondary | ICD-10-CM | POA: Insufficient documentation

## 2022-11-27 LAB — COMPREHENSIVE METABOLIC PANEL
ALT: 16 IU/L (ref 0–32)
AST: 16 IU/L (ref 0–40)
Albumin/Globulin Ratio: 1.5 (ref 1.2–2.2)
Albumin: 4.4 g/dL (ref 3.8–4.8)
Alkaline Phosphatase: 143 IU/L — ABNORMAL HIGH (ref 44–121)
BUN/Creatinine Ratio: 19 (ref 12–28)
BUN: 15 mg/dL (ref 8–27)
Bilirubin Total: 0.3 mg/dL (ref 0.0–1.2)
CO2: 23 mmol/L (ref 20–29)
Calcium: 9.6 mg/dL (ref 8.7–10.3)
Chloride: 102 mmol/L (ref 96–106)
Creatinine, Ser: 0.79 mg/dL (ref 0.57–1.00)
Globulin, Total: 3 g/dL (ref 1.5–4.5)
Glucose: 79 mg/dL (ref 70–99)
Potassium: 4 mmol/L (ref 3.5–5.2)
Sodium: 141 mmol/L (ref 134–144)
Total Protein: 7.4 g/dL (ref 6.0–8.5)
eGFR: 79 mL/min/{1.73_m2} (ref >59)

## 2022-11-27 LAB — CBC WITH DIFFERENTIAL/PLATELET
Basophils Absolute: 0.1 10*3/uL (ref 0.0–0.2)
Basos: 1 %
EOS (ABSOLUTE): 0.2 10*3/uL (ref 0.0–0.4)
Eos: 3 %
Hematocrit: 39.1 % (ref 34.0–46.6)
Hemoglobin: 13 g/dL (ref 11.1–15.9)
Immature Grans (Abs): 0 10*3/uL (ref 0.0–0.1)
Immature Granulocytes: 0 %
Lymphocytes Absolute: 2 10*3/uL (ref 0.7–3.1)
Lymphs: 35 %
MCH: 29.1 pg (ref 26.6–33.0)
MCHC: 33.2 g/dL (ref 31.5–35.7)
MCV: 88 fL (ref 79–97)
Monocytes Absolute: 0.6 10*3/uL (ref 0.1–0.9)
Monocytes: 11 %
Neutrophils Absolute: 2.8 10*3/uL (ref 1.4–7.0)
Neutrophils: 50 %
Platelets: 254 10*3/uL (ref 150–450)
RBC: 4.47 x10E6/uL (ref 3.77–5.28)
RDW: 13.4 % (ref 11.7–15.4)
WBC: 5.6 10*3/uL (ref 3.4–10.8)

## 2022-11-27 LAB — LIPID PANEL WITH LDL/HDL RATIO
Cholesterol, Total: 161 mg/dL (ref 100–199)
HDL: 83 mg/dL (ref >39)
LDL Chol Calc (NIH): 67 mg/dL (ref 0–99)
LDL/HDL Ratio: 0.8 ratio (ref 0.0–3.2)
Triglycerides: 52 mg/dL (ref 0–149)
VLDL Cholesterol Cal: 11 mg/dL (ref 5–40)

## 2022-11-27 LAB — INSULIN, RANDOM: INSULIN: 10.7 u[IU]/mL (ref 2.6–24.9)

## 2022-11-27 LAB — TSH: TSH: 0.44 u[IU]/mL — ABNORMAL LOW (ref 0.450–4.500)

## 2022-11-27 LAB — HEMOGLOBIN A1C
Est. average glucose Bld gHb Est-mCnc: 126 mg/dL
Hgb A1c MFr Bld: 6 % — ABNORMAL HIGH (ref 4.8–5.6)

## 2022-11-27 LAB — VITAMIN B12: Vitamin B-12: 685 pg/mL (ref 232–1245)

## 2022-11-27 LAB — VITAMIN D 25 HYDROXY (VIT D DEFICIENCY, FRACTURES): Vit D, 25-Hydroxy: 33.5 ng/mL (ref 30.0–100.0)

## 2022-11-28 ENCOUNTER — Other Ambulatory Visit (HOSPITAL_COMMUNITY): Payer: Self-pay

## 2022-11-29 ENCOUNTER — Other Ambulatory Visit (HOSPITAL_BASED_OUTPATIENT_CLINIC_OR_DEPARTMENT_OTHER): Payer: Self-pay | Admitting: Cardiology

## 2022-11-29 ENCOUNTER — Other Ambulatory Visit (HOSPITAL_BASED_OUTPATIENT_CLINIC_OR_DEPARTMENT_OTHER): Payer: Self-pay | Admitting: Nurse Practitioner

## 2022-11-29 DIAGNOSIS — M6281 Muscle weakness (generalized): Secondary | ICD-10-CM

## 2022-11-29 DIAGNOSIS — I10 Essential (primary) hypertension: Secondary | ICD-10-CM

## 2022-11-29 DIAGNOSIS — E559 Vitamin D deficiency, unspecified: Secondary | ICD-10-CM

## 2022-12-02 NOTE — Progress Notes (Signed)
Chief Complaint:   OBESITY Natasha [name] (MR# [id number]) is a 72 y.o. female who presents for evaluation and treatment of obesity and related comorbidities. Current BMI is Body mass index is 51.17 kg/m. Natasha [name] has been struggling with her weight for many years and has been unsuccessful in either losing weight, maintaining weight loss, or reaching her healthy weight goal.  Natasha [name] states that she does not really like to cook. She craves crunchy-sweet.   Natasha [name] is currently in the action stage of change and ready to dedicate time achieving and maintaining a healthier weight. Natasha [name] is interested in becoming our patient and working on intensive lifestyle modifications including (but not limited to) diet and exercise for weight loss.  Natasha [name]'s habits were reviewed today and are as follows: her desired weight loss is 85 lbs, she started gaining weight after pregnancy and thyroidectomy, her heaviest weight ever was 323 pounds, she has significant food cravings issues, she skips meals frequently, she is frequently drinking liquids with calories, she frequently makes poor food choices, she has problems with excessive hunger, she frequently eats larger portions than normal, and she struggles with emotional eating.  Depression Screen Natasha [name]'s Food and Mood (modified PHQ-9) score was 20.  Subjective:   1. Other fatigue Natasha [name] admits to daytime somnolence and admits to waking up still tired. Patient has a history of symptoms of daytime fatigue and morning fatigue. Natasha [name] generally gets 6 hours of sleep per night, and states that she has nightime awakenings. Snoring is present. Apneic episodes are present. Epworth Sleepiness Score is 13.   2. SOB (shortness of breath) on exertion Natasha [name] notes increasing shortness of breath with exercising and seems to be worsening over time with weight gain. She notes getting out of breath sooner with activity than she used to. This has not gotten worse recently. Natasha [name] denies  shortness of breath at rest or orthopnea.  3. Essential hypertension Natasha [name] is taking Norvasc and her blood pressure is stable.   4. Hypothyroidism, postsurgical Natasha [name] is taking Synthroid.   5. Fatty liver Natasha [name] liver enzymes were normal.   6. Health care maintenance Given obesity.   7. Vitamin D deficiency Natasha [name] is taking multivitamins with calcium.   8. B12 deficiency Natasha [name] is taking multivitamins.   9. OSA (obstructive sleep apnea) Natasha [name]'s CPAP machine is not working. Last sleep study was 10 years ago.   10. Elevated glucose Natasha [name] is not on medications.   Assessment/Plan:   1. Other fatigue Natasha [name] does feel that her weight is causing her energy to be lower than it should be. Fatigue may be related to obesity, depression or many other causes. Labs will be ordered, and in the meanwhile, Natasha [name] will focus on self care including making healthy food choices, increasing physical activity and focusing on stress reduction.  - EKG 12-Lead - TSH - Comprehensive metabolic panel  2. SOB (shortness of breath) on exertion Natasha [name] does feel that she gets out of breath more easily that she used to when she exercises. Natasha [name] shortness of breath appears to be obesity related and exercise induced. She has agreed to work on weight loss and gradually increase exercise to treat her exercise induced shortness of breath. Will continue to monitor closely.  - TSH - Comprehensive metabolic panel  3. Essential hypertension We will check labs today. Natasha [name] will continue her medication, and eliminate added salt.   - Lipid Panel With LDL/HDL Ratio  4. Hypothyroidism, postsurgical We will check labs today. Natasha [name] will continue  her medications.  - TSH  5. Fatty liver We will check labs today. Natasha [name] will follow-up with her PCP. She will begin her plan and exercise.   - Lipid Panel With LDL/HDL Ratio  6. Health care maintenance We will check labs today. EKG and IC were done today, and results were reviewed  with the patient.   - Hemoglobin A1c - Insulin, random - Vitamin B12 - Lipid Panel With LDL/HDL Ratio - VITAMIN D 25 Hydroxy (Vit-D Deficiency, Fractures) - TSH - Comprehensive metabolic panel - CBC with Differential/Platelet  7. Vitamin D deficiency We will check labs today, and we will follow-up at [facility] next visit.   - VITAMIN D 25 Hydroxy (Vit-D Deficiency, Fractures)  8. B12 deficiency We will check labs today, and we will follow-up at [facility] next visit.   - Vitamin B12  9. OSA (obstructive sleep apnea) Referral for sleep study.   10. Elevated glucose We will check labs today, and we will follow-up at [name] next visit.   - Hemoglobin A1c - Insulin, random  11. Depression screening Natasha [name] had a positive depression screening. Depression is commonly associated with obesity and often results in emotional eating behaviors. We will monitor this closely and work on CBT to help improve the non-hunger eating patterns. Referral to Psychology may be required if no improvement is seen as she continues in our clinic.  12. Morbid obesity ([place])  13. BMI 50.0-59.9, adult Natasha [name]) Natasha [name] is currently in the action stage of change and her goal is to continue with weight loss efforts. I recommend Natasha [name] begin the structured treatment plan as follows:  She has agreed to the Category 3 Plan.  Meal planning and mindful eating were discussed. Eliminate juice and regular tea.   Exercise goals: No exercise has been prescribed at this time.   Behavioral modification strategies: increasing lean protein intake, decreasing simple carbohydrates, increasing vegetables, increasing water intake, decreasing eating out, no skipping meals, meal planning and cooking strategies, keeping healthy foods in the home, and planning for success.  She was informed of the importance of frequent follow-up visits to maximize her success with intensive lifestyle modifications for her multiple health conditions. She was  informed we would discuss her lab results at her next visit unless there is a critical issue that needs to be addressed sooner. Natasha [name] agreed to keep her next visit at the agreed upon time to discuss these results.  Objective:   Blood pressure (!) 156/83, pulse 60, temperature 97.8 F (36.6 C), height 5\' 6"  (1.676 m), weight (!) 317 lb (143.8 kg), SpO2 97 %. Body mass index is 51.17 kg/m.  EKG: Normal sinus rhythm, rate 59 BPM.  Indirect Calorimeter completed today shows a VO2 of 278 and a REE of 1915.  Her calculated basal metabolic rate is [id number] thus her basal metabolic rate is better than expected.  General: Cooperative, alert, well developed, in no acute distress. HEENT: Conjunctivae and lids unremarkable. Cardiovascular: Regular rhythm.  Lungs: Normal work of breathing. Neurologic: No focal deficits.   Lab Results  Component Value Date   CREATININE 0.79 11/26/2022   BUN 15 11/26/2022   NA 141 11/26/2022   K 4.0 11/26/2022   CL 102 11/26/2022   CO2 23 11/26/2022   Lab Results  Component Value Date   ALT 16 11/26/2022   AST 16 11/26/2022   ALKPHOS 143 (H) 11/26/2022   BILITOT 0.3 11/26/2022   Lab Results  Component Value Date   HGBA1C 6.0 (H) 11/26/2022  HGBA1C 5.7 (H) 10/11/2022   HGBA1C 5.6 11/30/2021   HGBA1C 5.1 03/19/2021   Lab Results  Component Value Date   INSULIN 10.7 11/26/2022   Lab Results  Component Value Date   TSH 0.440 (L) 11/26/2022   Lab Results  Component Value Date   CHOL 161 11/26/2022   HDL 83 11/26/2022   LDLCALC 67 11/26/2022   TRIG 52 11/26/2022   CHOLHDL 2.1 10/11/2022   Lab Results  Component Value Date   WBC 5.6 11/26/2022   HGB 13.0 11/26/2022   HCT 39.1 11/26/2022   MCV 88 11/26/2022   PLT 254 11/26/2022   Lab Results  Component Value Date   FERRITIN 178 09/05/2019   Attestation Statements:   Reviewed by clinician on day of visit: allergies, medications, problem list, medical history, surgical history, family  history, social history, and previous encounter notes.   Time spent on visit including pre-visit chart review and post-visit charting and care was 45 minutes.    Wilhemena Durie, am acting as Location manager for [employer], DO.  I have reviewed the above documentation for accuracy and completeness, and I agree with the above. Jearld Lesch, DO

## 2022-12-04 DIAGNOSIS — R2689 Other abnormalities of gait and mobility: Secondary | ICD-10-CM | POA: Diagnosis not present

## 2022-12-06 DIAGNOSIS — R2689 Other abnormalities of gait and mobility: Secondary | ICD-10-CM | POA: Diagnosis not present

## 2022-12-10 ENCOUNTER — Encounter: Payer: Self-pay | Admitting: Bariatrics

## 2022-12-10 ENCOUNTER — Ambulatory Visit: Payer: Medicare PPO | Admitting: Bariatrics

## 2022-12-10 VITALS — BP 137/86 | HR 70 | Temp 97.4°F | Ht 66.0 in | Wt 311.0 lb

## 2022-12-10 DIAGNOSIS — E559 Vitamin D deficiency, unspecified: Secondary | ICD-10-CM | POA: Diagnosis not present

## 2022-12-10 DIAGNOSIS — R0683 Snoring: Secondary | ICD-10-CM

## 2022-12-10 DIAGNOSIS — R7989 Other specified abnormal findings of blood chemistry: Secondary | ICD-10-CM | POA: Diagnosis not present

## 2022-12-10 DIAGNOSIS — Z6841 Body Mass Index (BMI) 40.0 and over, adult: Secondary | ICD-10-CM

## 2022-12-10 DIAGNOSIS — R7303 Prediabetes: Secondary | ICD-10-CM | POA: Diagnosis not present

## 2022-12-10 HISTORY — DX: Snoring: R06.83

## 2022-12-10 HISTORY — DX: Other specified abnormal findings of blood chemistry: R79.89

## 2022-12-10 MED ORDER — VITAMIN D (ERGOCALCIFEROL) 1.25 MG (50000 UNIT) PO CAPS: 50000.0000 [IU] | ORAL_CAPSULE | ORAL | 0 refills | Status: DC

## 2022-12-10 NOTE — Progress Notes (Unsigned)
Chief Complaint:   OBESITY Natasha [name] is here to discuss her progress with her obesity treatment plan along with follow-up of her obesity related diagnoses. Natasha [name] is on the Category 3 Plan and states she is following her eating plan approximately 85% of the time. Natasha [name] states she is doing physical therapy/home exercises for 45-60 minutes 5 times per week.  Today's visit was #: 2 Starting weight: 317 lbs Starting date: 11/26/2022 Today's weight: 311 lbs Today's date: 12/10/2022 Total lbs lost to date: 6 Total lbs lost since last in-office visit: 6  Interim History: Natasha [name] is down 6 lbs since her last visit. She has not been happy on the plan.   Subjective:   1. Vitamin D deficiency Natasha [name]'s recent Vitamin D level was 33.5.  2. Low TSH level Natasha [name]'s recent level was 0.440, slightly low.   3. Prediabetes Natasha [name]'s last A1c was 5.7 on 10/11/2022, and her recent A1c was 6.0. She notes cravings for sugar.   4. Snoring Natasha [name] had a sleep study 10 years ago.   Assessment/Plan:   1. Vitamin D deficiency Natasha [name] agreed to start prescription Vitamin D 50,000 IU once weekly with no refills.   - Vitamin D, Ergocalciferol, (DRISDOL) 1.25 MG (50000 UNIT) CAPS capsule; Take 1 capsule (50,000 Units total) by mouth every 7 (seven) days.  Dispense: 5 capsule; Refill: 0  2. Low TSH level We will recheck labs in the near future.   3. Prediabetes Natasha [name] fruit/stevia-anti-inflammatory. Handouts on insulin resistance and pre-diabetes were given to the patient today.   4. Snoring [place] was referred for a sleep study evaluation.   - Ambulatory referral to Sleep Studies  5. Morbid obesity  6. BMI 50.0-59.9, adult Krisandra is currently in the action stage of change. As such, her goal is to continue with weight loss efforts. She has agreed to the Category 3 Plan.   Meal planning was discussed. Reviewed labs with the patient from 11/26/2022, CMP, lipid, Vit D, B12, CBC, A1c, insulin, and thyroid panel. Increase  non-starchy vegetables.   Exercise goals: As is.   Behavioral modification strategies: increasing lean protein intake, decreasing simple carbohydrates, increasing vegetables, increasing water intake, decreasing eating out, no skipping meals, meal planning and cooking strategies, keeping healthy foods in the home, and planning for success.  Natasha [name] has agreed to follow-up with our clinic in 2 weeks. She was informed of the importance of frequent follow-up visits to maximize her success with intensive lifestyle modifications for her multiple health conditions.   Objective:   Blood pressure 137/86, pulse 70, temperature (!) 97.4 F (36.3 C), height 5\' 6"  (1.676 m), weight (!) 311 lb (141.1 kg), SpO2 98 %. Body mass index is 50.2 kg/m.  Ambulation: using a walker and a cane for walking.  General: Cooperative, alert, well developed, in no acute distress. HEENT: Conjunctivae and lids unremarkable. Cardiovascular: Regular rhythm.  Lungs: Normal work of breathing. Neurologic: No focal deficits.   Lab Results  Component Value Date   CREATININE 0.79 11/26/2022   BUN 15 11/26/2022   NA 141 11/26/2022   K 4.0 11/26/2022   CL 102 11/26/2022   CO2 23 11/26/2022   Lab Results  Component Value Date   ALT 16 11/26/2022   AST 16 11/26/2022   ALKPHOS 143 (H) 11/26/2022   BILITOT 0.3 11/26/2022   Lab Results  Component Value Date   HGBA1C 6.0 (H) 11/26/2022   HGBA1C 5.7 (H) 10/11/2022   HGBA1C 5.6 11/30/2021   HGBA1C 5.1 03/19/2021  Lab Results  Component Value Date   INSULIN 10.7 11/26/2022   Lab Results  Component Value Date   TSH 0.440 (L) 11/26/2022   Lab Results  Component Value Date   CHOL 161 11/26/2022   HDL 83 11/26/2022   LDLCALC 67 11/26/2022   TRIG 52 11/26/2022   CHOLHDL 2.1 10/11/2022   Lab Results  Component Value Date   VD25OH 33.5 11/26/2022   VD25OH 30.2 10/11/2022   VD25OH 28.8 (L) 11/30/2021   Lab Results  Component Value Date   WBC 5.6 11/26/2022    HGB 13.0 11/26/2022   HCT 39.1 11/26/2022   MCV 88 11/26/2022   PLT 254 11/26/2022   Lab Results  Component Value Date   FERRITIN 178 09/05/2019   Attestation Statements:   Reviewed by clinician on day of visit: allergies, medications, problem list, medical history, surgical history, family history, social history, and previous encounter notes.   Trude Mcburney, am acting as transcriptionist for [employer], DO  I have reviewed the above documentation for accuracy and completeness, and I agree with the above. Corinna Capra, DO

## 2022-12-11 DIAGNOSIS — R2689 Other abnormalities of gait and mobility: Secondary | ICD-10-CM | POA: Diagnosis not present

## 2022-12-13 DIAGNOSIS — R2689 Other abnormalities of gait and mobility: Secondary | ICD-10-CM | POA: Diagnosis not present

## 2022-12-26 ENCOUNTER — Other Ambulatory Visit (HOSPITAL_BASED_OUTPATIENT_CLINIC_OR_DEPARTMENT_OTHER): Payer: Self-pay | Admitting: Nurse Practitioner

## 2022-12-26 DIAGNOSIS — R2689 Other abnormalities of gait and mobility: Secondary | ICD-10-CM | POA: Diagnosis not present

## 2022-12-26 DIAGNOSIS — E039 Hypothyroidism, unspecified: Secondary | ICD-10-CM

## 2022-12-30 ENCOUNTER — Encounter: Payer: Self-pay | Admitting: Nurse Practitioner

## 2022-12-31 ENCOUNTER — Encounter: Payer: Self-pay | Admitting: Nurse Practitioner

## 2022-12-31 ENCOUNTER — Ambulatory Visit (INDEPENDENT_AMBULATORY_CARE_PROVIDER_SITE_OTHER): Payer: Medicare PPO | Admitting: Nurse Practitioner

## 2022-12-31 VITALS — BP 146/84 | HR 61 | Temp 97.8°F | Ht 66.0 in | Wt 307.0 lb

## 2022-12-31 DIAGNOSIS — E559 Vitamin D deficiency, unspecified: Secondary | ICD-10-CM

## 2022-12-31 DIAGNOSIS — Z6841 Body Mass Index (BMI) 40.0 and over, adult: Secondary | ICD-10-CM | POA: Diagnosis not present

## 2022-12-31 DIAGNOSIS — I10 Essential (primary) hypertension: Secondary | ICD-10-CM

## 2022-12-31 NOTE — Progress Notes (Signed)
Office: [phone]  /  Fax: [phone]  WEIGHT SUMMARY AND BIOMETRICS  Weight Lost Since Last Visit: 4lb  No data recorded  Vitals Temp: 97.8 F (36.6 C) BP: (!) 146/84 Pulse Rate: 61 SpO2: 100 %   Anthropometric Measurements Height: 5\' 6"  (1.676 m) Weight: (!) 307 lb (139.3 kg) BMI (Calculated): 49.57 Weight at Last Visit: 311lb Weight Lost Since Last Visit: 4lb Starting Weight: 317lb Total Weight Loss (lbs): 10 lb (4.536 kg)   Body Composition  Body Fat %: 53 % Fat Mass (lbs): 163 lbs Muscle Mass (lbs): 137 lbs Visceral Fat Rating : 22   Other Clinical Data Fasting: No Labs: No Today's Visit #: 3 Starting Date: 11/26/22     HPI  Chief Complaint: OBESITY  Braelynn is here to discuss her progress with her obesity treatment plan. She is on the the Category 3 Plan and states she is following her eating plan approximately 90 % of the time. She states she is exercising 45 minutes 5 days per week-water aerobics and PT once per week.     Interval History:  Since last office visit she has lost 4 pounds.  She is struggling with eating all the food on her meal plan.  She doesn't feel she is eating enough.  Doesn't tend to eat breakfast.  She is not drinking enough water due to incontinence.  She is also drinking body armour and decaf coffee.      Pharmacotherapy for weight loss: She is not currently taking medications  for medical weight loss.   Previous pharmacotherapy for medical weight loss:  never been on weight loss meds  Bariatric surgery:  Patient hasn't had bariatric surgery.    Vit D deficiency  She is not taking Vit D on a regular basis.  Denies side effects.  Denies nausea, vomiting or muscle weakness.    Lab Results  Component Value Date   VD25OH 33.5 11/26/2022   VD25OH 30.2 10/11/2022   VD25OH 28.8 (L) 11/30/2021    Hypertension Hypertension poorly controlled.  Medication(s): Norvasc 5mg  and Diovan 160mg . BP is elevated today.  Not taking  meds on a regular basis.   Denies chest pain, palpitations and SOB.  BP Readings from Last 3 Encounters:  12/31/22 (!) 146/84  12/10/22 137/86  11/26/22 (!) 156/83   Lab Results  Component Value Date   CREATININE 0.79 11/26/2022   CREATININE 0.92 10/11/2022   CREATININE 0.89 07/12/2022    PHYSICAL EXAM:  Blood pressure (!) 146/84, pulse 61, temperature 97.8 F (36.6 C), height 5\' 6"  (1.676 m), weight (!) 307 lb (139.3 kg), SpO2 100 %. Body mass index is 49.55 kg/m.  General: She is overweight, cooperative, alert, well developed, and in no acute distress. PSYCH: Has normal mood, affect and thought process.   Extremities: No edema.  Neurologic: No gross sensory or motor deficits. No tremors or fasciculations noted.    DIAGNOSTIC DATA REVIEWED:  BMET    Component Value Date/Time   NA 141 11/26/2022 1249   K 4.0 11/26/2022 1249   CL 102 11/26/2022 1249   CO2 23 11/26/2022 1249   GLUCOSE 79 11/26/2022 1249   GLUCOSE 96 07/12/2022 0850   BUN 15 11/26/2022 1249   CREATININE 0.79 11/26/2022 1249   CREATININE 0.74 12/31/2015 1145   CALCIUM 9.6 11/26/2022 1249   GFRNONAA >60 07/12/2022 0850   GFRAA 76 12/16/2019 1037   Lab Results  Component Value Date   HGBA1C 6.0 (H) 11/26/2022   HGBA1C 5.1 03/19/2021  Lab Results  Component Value Date   INSULIN 10.7 11/26/2022   Lab Results  Component Value Date   TSH 0.440 (L) 11/26/2022   CBC    Component Value Date/Time   WBC 5.6 11/26/2022 1249   WBC 7.8 07/12/2022 0850   RBC 4.47 11/26/2022 1249   RBC 4.85 07/12/2022 0850   HGB 13.0 11/26/2022 1249   HCT 39.1 11/26/2022 1249   PLT 254 11/26/2022 1249   MCV 88 11/26/2022 1249   MCH 29.1 11/26/2022 1249   MCH 29.1 07/12/2022 0850   MCHC 33.2 11/26/2022 1249   MCHC 32.3 07/12/2022 0850   RDW 13.4 11/26/2022 1249   Iron Studies    Component Value Date/Time   FERRITIN 178 09/05/2019 0500   Lipid Panel     Component Value Date/Time   CHOL 161 11/26/2022 1249    TRIG 52 11/26/2022 1249   HDL 83 11/26/2022 1249   CHOLHDL 2.1 10/11/2022 1458   CHOLHDL 2.3 09/04/2019 1230   VLDL 18 09/04/2019 1230   LDLCALC 67 11/26/2022 1249   Hepatic Function Panel     Component Value Date/Time   PROT 7.4 11/26/2022 1249   ALBUMIN 4.4 11/26/2022 1249   AST 16 11/26/2022 1249   ALT 16 11/26/2022 1249   ALKPHOS 143 (H) 11/26/2022 1249   BILITOT 0.3 11/26/2022 1249   BILIDIR 0.1 05/23/2008 1721      Component Value Date/Time   TSH 0.440 (L) 11/26/2022 1249   Nutritional Lab Results  Component Value Date   VD25OH 33.5 11/26/2022   VD25OH 30.2 10/11/2022   VD25OH 28.8 (L) 11/30/2021     ASSESSMENT AND PLAN  TREATMENT PLAN FOR OBESITY:  Recommended Dietary Goals  Vaanya is currently in the action stage of change. As such, her goal is to continue weight management plan. She has agreed to keeping a food journal and adhering to recommended goals of 1500 calories and 100 protein.  Behavioral Intervention  We discussed the following Behavioral Modification Strategies today: increasing lean protein intake, decreasing simple carbohydrates , increasing vegetables, increasing lower glycemic fruits, increasing fiber rich foods, avoiding skipping meals, increasing water intake, continue to practice mindfulness when eating, and planning for success.  Additional resources provided today: NA  Recommended Physical Activity Goals  Kasidee has been advised to work up to 150 minutes of moderate intensity aerobic activity a week and strengthening exercises 2-3 times per week for cardiovascular health, weight loss maintenance and preservation of muscle mass.   She has agreed to Continue current level of physical activity    ASSOCIATED CONDITIONS ADDRESSED TODAY  Action/Plan  Vitamin D deficiency Encouraged to take Vit D as directed  Low Vitamin D level contributes to fatigue and are associated with obesity, breast, and colon cancer. She agrees to continue to  take prescription Vitamin D @50 ,000 IU every week and will follow-up for routine testing of Vitamin D, at least 2-3 times per year to avoid over-replacement.   Essential hypertension Encouraged to take meds as directed.  BP is elevated today.  Discussed the risks associated with HTN such as but not limited to CVA and MI.  Morbid obesity (HCC)  BMI 45.0-49.9, adult (HCC)      Avoid Orlistat due to Vit D def.  Avoid Phentermine, Qsymia and Contrave due to uncontrolled HTN, heart failure, aortic atherosclerosis.  She would benefit from starting [facility] due to cardiac history.  See SELECT study.     Return in about 3 weeks (around 01/21/2023).Marland Kitchen She was  informed of the importance of frequent follow up visits to maximize her success with intensive lifestyle modifications for her multiple health conditions.   ATTESTASTION STATEMENTS:  Reviewed by clinician on day of visit: allergies, medications, problem list, medical history, surgical history, family history, social history, and previous encounter notes.   Time spent on visit including pre-visit chart review and post-visit care and charting was 30 minutes.    Theodis Sato. Zayne Marovich FNP-C

## 2023-01-01 DIAGNOSIS — R2689 Other abnormalities of gait and mobility: Secondary | ICD-10-CM | POA: Diagnosis not present

## 2023-01-14 ENCOUNTER — Encounter: Payer: Medicare PPO | Admitting: Nurse Practitioner

## 2023-01-21 ENCOUNTER — Encounter: Payer: Self-pay | Admitting: Nurse Practitioner

## 2023-01-23 ENCOUNTER — Encounter: Payer: Self-pay | Admitting: Bariatrics

## 2023-01-23 ENCOUNTER — Ambulatory Visit (INDEPENDENT_AMBULATORY_CARE_PROVIDER_SITE_OTHER): Payer: Medicare PPO | Admitting: Bariatrics

## 2023-01-23 VITALS — BP 120/75 | HR 74 | Temp 98.1°F | Ht 66.0 in | Wt 304.0 lb

## 2023-01-23 DIAGNOSIS — Z6841 Body Mass Index (BMI) 40.0 and over, adult: Secondary | ICD-10-CM | POA: Diagnosis not present

## 2023-01-23 DIAGNOSIS — R7303 Prediabetes: Secondary | ICD-10-CM

## 2023-01-23 DIAGNOSIS — E559 Vitamin D deficiency, unspecified: Secondary | ICD-10-CM

## 2023-01-23 MED ORDER — VITAMIN D (ERGOCALCIFEROL) 1.25 MG (50000 UNIT) PO CAPS: 50000.0000 [IU] | ORAL_CAPSULE | ORAL | 0 refills | Status: DC

## 2023-01-23 NOTE — Progress Notes (Signed)
   WEIGHT SUMMARY AND BIOMETRICS  Weight Lost Since Last Visit: 3lb   Vitals Temp: 98.1 F (36.7 C) BP: 120/75 Pulse Rate: 74 SpO2: 96 %   Anthropometric Measurements Height: 5\' 6"  (1.676 m) Weight: (!) 304 lb (137.9 kg) BMI (Calculated): 49.09 Weight at Last Visit: 307lb Weight Lost Since Last Visit: 3lb Starting Weight: 317lb Total Weight Loss (lbs): 13 lb (5.897 kg)   Body Composition  Body Fat %: 54.1 % Fat Mass (lbs): 164.6 lbs Muscle Mass (lbs): 132.8 lbs Visceral Fat Rating : 22   Other Clinical Data Fasting: no Labs: no Today's Visit #: 4 Starting Date: 11/26/22    OBESITY Natasha [name] is here to discuss her progress with her obesity treatment plan along with follow-up of her obesity related diagnoses.     Nutrition Plan: the Category 3 plan - 30% adherence.  Current exercise: none  Interim History:  She is down 3 lbs, but states that she has been eating cake.  Protein intake is as prescribed and Water intake is adequate.   Hunger is moderately controlled.  Cravings are moderately controlled.  Assessment/Plan:   1. Vitamin D deficiency Vitamin D Deficiency Vitamin D is not at goal of 50.  Most recent vitamin D level was 33.5. She is on  prescription ergocalciferol 50,000 IU weekly. Lab Results  Component Value Date   VD25OH 33.5 11/26/2022   VD25OH 30.2 10/11/2022   VD25OH 28.8 (L) 11/30/2021    Plan: Refill prescription vitamin D 50,000 IU weekly.   Prediabetes Last A1c was 6.0   Lab Results  Component Value Date   HGBA1C 6.0 (H) 11/26/2022   HGBA1C 5.7 (H) 10/11/2022   HGBA1C 5.6 11/30/2021   HGBA1C 5.1 03/19/2021   Lab Results  Component Value Date   INSULIN 10.7 11/26/2022    Plan:  Will minimize all refined carbohydrates both sweets and starches.  Will work on the plan and exercise.  Consider both aerobic and resistance training.  Will keep protein, water, and fiber intake high.  Increase Polyunsaturated and  Monounsaturated fats to increase satiety and encourage weight loss.  Aim for 7 to 9 hours of sleep nightly.  Will continue medications.    Morbid Obesity: Current BMI BMI (Calculated): 49.09    Natasha [name] is currently in the action stage of change. As such, her goal is to continue with weight loss efforts.  She has agreed to the Category 3 plan.  Exercise goals: Older adults should do exercises that maintain or improve balance if they are at risk of falling.   Behavioral modification strategies: planning for success.  Natasha [name] has agreed to follow-up with our clinic in 2 weeks.   No orders of the defined types were placed in this encounter.   There are no discontinued medications.   No orders of the defined types were placed in this encounter.     Objective:   VITALS: Per patient if applicable, see vitals. GENERAL: Alert and in no acute distress. CARDIOPULMONARY: No increased WOB. Speaking in clear sentences.  PSYCH: Pleasant and cooperative. Speech normal rate and rhythm. Affect is appropriate. Insight and judgement are appropriate. Attention is focused, linear, and appropriate.  NEURO: Oriented as arrived to appointment on time with no prompting.   Attestation Statements:   This was prepared with the assistance of Engineer, civil (consulting).  Occasional wrong-word or sound-a-like substitutions may have occurred due to the inherent limitations of voice recognition software.   Corinna Capra, DO

## 2023-01-31 ENCOUNTER — Encounter: Payer: Self-pay | Admitting: Medical

## 2023-01-31 ENCOUNTER — Ambulatory Visit (INDEPENDENT_AMBULATORY_CARE_PROVIDER_SITE_OTHER): Payer: Medicare PPO | Admitting: Medical

## 2023-01-31 VITALS — BP 130/80 | HR 64 | Temp 97.7°F | Resp 22 | Wt 311.0 lb

## 2023-01-31 DIAGNOSIS — N390 Urinary tract infection, site not specified: Secondary | ICD-10-CM

## 2023-01-31 DIAGNOSIS — M542 Cervicalgia: Secondary | ICD-10-CM

## 2023-01-31 DIAGNOSIS — M549 Dorsalgia, unspecified: Secondary | ICD-10-CM

## 2023-01-31 DIAGNOSIS — R262 Difficulty in walking, not elsewhere classified: Secondary | ICD-10-CM

## 2023-01-31 DIAGNOSIS — R29898 Other symptoms and signs involving the musculoskeletal system: Secondary | ICD-10-CM

## 2023-01-31 DIAGNOSIS — M25511 Pain in right shoulder: Secondary | ICD-10-CM

## 2023-01-31 DIAGNOSIS — R300 Dysuria: Secondary | ICD-10-CM

## 2023-01-31 DIAGNOSIS — R350 Frequency of micturition: Secondary | ICD-10-CM

## 2023-01-31 DIAGNOSIS — N3281 Overactive bladder: Secondary | ICD-10-CM

## 2023-01-31 DIAGNOSIS — Z9989 Dependence on other enabling machines and devices: Secondary | ICD-10-CM | POA: Diagnosis not present

## 2023-01-31 HISTORY — DX: Cervicalgia: M54.2

## 2023-01-31 HISTORY — DX: Pain in right shoulder: M25.511

## 2023-01-31 HISTORY — DX: Urinary tract infection, site not specified: N39.0

## 2023-01-31 HISTORY — DX: Dorsalgia, unspecified: M54.9

## 2023-01-31 HISTORY — DX: Other symptoms and signs involving the musculoskeletal system: R29.898

## 2023-01-31 MED ORDER — METHOCARBAMOL 500 MG PO TABS: 500.0000 mg | ORAL_TABLET | Freq: Two times a day (BID) | ORAL | 0 refills | Status: DC | PRN

## 2023-01-31 NOTE — Progress Notes (Signed)
Subjective:  Natasha [name] is a 72 y.o. female who presents for Chief Complaint  Patient presents with   Shoulder Pain    Complains of right shoulder pain x 4 weeks. Hurts to raise arm above her head.    Urinary Frequency    Started 6-7 days ago. Also reports urinary incontinence.      Here for several concerns. Son Dondra Prader is listening in on speaker phone.    Here for right shoulder pain x 4 weeks, can't move it over head.  Intermittent pain.  Lifting anything hurts.  Sometimes pain goes around to lateral chest. No fall, no injury.   Right handed.  No recently repetitive activity.   Has seen ortho in past in general but not in last year.  Gets numbness and tingling in both arms, left arm always.    Worried about UTI, has hx/o chronic UTI.  Has incontinence, sometimes frequent nocturnal urine frequency.   She urinated in the office waiting room when first came in.  No fever.  Last antibiotic maybe a month ago.  Been on antibiotics at least twice recently.   Has seen urology for this prior, tried several thing, [facility].  Has been on antibiotics prior for a year, few years ago.  Has used pelvic floor therapy as well. Has tried a medication prior for OAB.  Has been on Myrbetriq.    Needs handicap sticker.   Limited by leg weakness, back pain, decreased ambulation, cannot walk long distance.    Would like a powered scooter/power chair.  Currently using a Rolator walker.  Uses cane as well.  Lives alone.  She drove here today.  No other aggravating or relieving factors.    No other c/o.  Past Medical History:  Diagnosis Date   Abdominal spasms 12/14/2020   Acute pulmonary embolism (HCC) 08/21/2019   Bacterial conjunctivitis of both eyes 12/14/2020   Bilateral leg edema 02/10/2020   Bradycardia    Breast cancer screening by mammogram 11/24/2020   CARCINOMA, THYROID GLAND, PAPILLARY 05/04/2008   Stage 2, 8/09: thyroidectomy for 2.7cm papillary adenocarcinoma (t2 n0 mo) 9/09: I-131  rx, 108 mci 05/10: tg is neg (ab neg) , total body scan is neg   Colon cancer screening 11/24/2020   Colon polyps    Complication of anesthesia    trouble with airway   Cough 12/25/2007   Qualifier: Diagnosis of  By: Thad Ranger LPN, Megan     [id number] 08/19/2019   Diverticulosis    DVT (deep venous thrombosis) (HCC)    Edema 12/22/2008   Encounter to establish care 11/24/2020   History of 2019 novel coronavirus disease (COVID-19) 11/09/2019   HYPERTENSION 12/25/2007   HYPOTHYROIDISM, POSTSURGICAL 05/04/2008   LEG PAIN, LEFT 12/09/2008   Lumbago with sciatica, right side 01/08/2021   Memory loss due to medical condition 11/09/2019   Muscle weakness    Nausea and vomiting 05/23/2008   Qualifier: Diagnosis of  By: Everardo All MD, Clayborn Heron of this note might be different from the original. Qualifier: Diagnosis of  By: Everardo All MD, Gregary Signs A   Numbness and tingling    OSA (obstructive sleep apnea) 06/15/2013   CPAP   Paresthesia 01/04/2020   Peripheral edema 12/22/2008   Qualifier: Diagnosis of  By: Eden Emms, MD, Harrington Challenger    Pneumonia due to COVID-19 virus    Pulmonary embolism [facility])    Pulmonary embolism (HCC) 08/23/2019   Right lower quadrant pain 11/24/2020   Sciatica of  left side 12/04/2011   Skin sensation disturbance 12/05/2008   Qualifier: Diagnosis of  By: Felicity Coyer MD, Vista Mink of this note might be different from the original. Qualifier: Diagnosis of  By: Felicity Coyer MD, Vikki Ports A   Spasm of muscle of lower back 12/14/2020   Vaginal candidiasis 03/16/2021   Weakness 01/04/2020   Current Outpatient Medications on File Prior to Visit  Medication Sig Dispense Refill   acetaminophen (TYLENOL) 500 MG tablet Take 1 tablet (500 mg total) by mouth every 6 (six) hours as needed. 30 tablet 0   amLODipine (NORVASC) 5 MG tablet TAKE 1 TABLET BY MOUTH AT BEDTIME FOR BLOOD PRESSURE 90 tablet 0   furosemide (LASIX) 20 MG tablet Take 1 tablet (20 mg total) by mouth daily. 90 tablet 3   Krill  Oil 1000 MG CAPS Take 1,000 mg by mouth 2 (two) times daily.     montelukast (SINGULAIR) 10 MG tablet Take 1 tablet (10 mg total) by mouth at bedtime. 90 tablet 3   Multiple Vitamin (MULTIVITAMIN WITH MINERALS) TABS tablet Take 1 tablet by mouth daily.     nystatin cream (MYCOSTATIN) Apply 1 Application topically 2 (two) times daily. 30 g 0   pantoprazole (PROTONIX) 40 MG tablet Take 1 tablet (40 mg total) by mouth daily. 90 tablet 3   rivaroxaban (XARELTO) 20 MG TABS tablet Take 1 tablet (20 mg total) by mouth daily with supper. 90 tablet 3   SYNTHROID 150 MCG tablet TAKE 1 TABLET BY MOUTH EVERY DAY 90 tablet 1   Trospium Chloride 60 MG CP24 Take 1 capsule (60 mg total) by mouth daily at 12 noon. 90 capsule 1   valsartan (DIOVAN) 160 MG tablet Take 1 tablet (160 mg total) by mouth daily. 90 tablet 3   Vitamin D, Ergocalciferol, (DRISDOL) 1.25 MG (50000 UNIT) CAPS capsule Take 1 capsule (50,000 Units total) by mouth every 7 (seven) days. 5 capsule 0   AREXVY 120 MCG/0.5ML injection Inject 0.5 mLs into the muscle once. (Patient not taking: Reported on 01/31/2023)     dicyclomine (BENTYL) 10 MG capsule Take 10 mg by mouth 4 (four) times daily -  before meals and at bedtime. (Patient not taking: Reported on 01/31/2023)     fluconazole (DIFLUCAN) 150 MG tablet Take one tablet by mouth at the first sign of symptoms of yeast. If no resolution, repeat dose in 72 hours. (Patient not taking: Reported on 01/31/2023) 2 tablet 2   LINZESS 145 MCG CAPS capsule Take 145 mcg by mouth daily before breakfast. (Patient not taking: Reported on 01/31/2023)     No current facility-administered medications on file prior to visit.   Past Surgical History:  Procedure Laterality Date   ABDOMINAL HYSTERECTOMY     due to bleeding, ovaries remain   ANKLE SURGERY Right    CERVICAL FUSION     x 2   COLONOSCOPY WITH PROPOFOL N/A 08/08/2015   Procedure: COLONOSCOPY WITH PROPOFOL;  Surgeon: Charna Elizabeth, MD;  Location: [facility]  ENDOSCOPY;  Service: Endoscopy;  Laterality: N/A;   KNEE SURGERY Left    TOTAL THYROIDECTOMY       The following portions of the patient's history were reviewed and updated as appropriate: allergies, current medications, past family history, past medical history, past social history, past surgical history and problem list.  ROS Otherwise as in subjective above     Objective: BP 130/80   Pulse 64   Temp 97.7 F (36.5 C) (Oral)   Resp Marland Kitchen)  22   Wt (!) 311 lb (141.1 kg)   SpO2 95% Comment: room air  BMI 50.20 kg/m   General appearance: alert, no distress, well developed, well nourished Tender over right supraspinatus, paraspinal region and trapezium, otherwise nontender of back Neck tender posterior and right lateral neck, ROM decreased in all directions but less so to right rotational, no mass, no lymphadenopathy, anterior cervical surgical scar Seated with Rolator walker     Assessment: Encounter Diagnoses  Name Primary?   Neck pain Yes   Decreased ROM of neck    Upper back pain    Acute pain of right shoulder    Dysuria    OAB (overactive bladder)    Urine frequency    Recurrent urinary tract infection    Walker as ambulation aid    Ambulatory dysfunction      Plan: Neck pain, decreased ROM, upper back pain, +spasm  Referral to PT Can use robaxin up to BID prn for spasm, caution on sedation Can use tylenol or aleve occasionally prn for pain Continue with heat, stretching  Dysuria, hx/o recurrent UTI - sees urology Request prior records UA and culture ordered - she couldn't leave sampel today.  She will return to give sample  Prior has tried pelvic floor therapy, several different medications for OAB, and still has issues with this F/u with urology  Chronic back pain, leg weakness, decreased ambulation Completed form for handicap placard today She will need to f/u with PCP separately for discussion about power chair/mobility device with proper  paperwork    Whittni was seen today for shoulder pain and urinary frequency.  Diagnoses and all orders for this visit:  Neck pain -     Ambulatory referral to Physical Therapy  Decreased ROM of neck -     Ambulatory referral to Physical Therapy  Upper back pain -     Ambulatory referral to Physical Therapy  Acute pain of right shoulder -     Ambulatory referral to Physical Therapy  Dysuria -     Cancel: POCT Urinalysis DIP (Proadvantage Device) -     Cancel: Urine Culture -     POCT Urinalysis DIP (Proadvantage Device); Future -     Urine Culture; Future  OAB (overactive bladder) -     POCT Urinalysis DIP (Proadvantage Device); Future -     Urine Culture; Future  Urine frequency -     Cancel: POCT Urinalysis DIP (Proadvantage Device) -     Cancel: Urine Culture -     POCT Urinalysis DIP (Proadvantage Device); Future -     Urine Culture; Future  Recurrent urinary tract infection  Walker as ambulation aid  Ambulatory dysfunction -     Ambulatory referral to Physical Therapy  Other orders -     methocarbamol (ROBAXIN) 500 MG tablet; Take 1 tablet (500 mg total) by mouth 2 (two) times daily as needed for muscle spasms.    Follow up: pending referral

## 2023-02-04 ENCOUNTER — Encounter: Payer: Self-pay | Admitting: Nurse Practitioner

## 2023-02-04 ENCOUNTER — Ambulatory Visit: Payer: Medicare PPO | Admitting: Nurse Practitioner

## 2023-02-04 VITALS — BP 132/78 | HR 67 | Wt 310.0 lb

## 2023-02-04 DIAGNOSIS — R300 Dysuria: Secondary | ICD-10-CM | POA: Diagnosis not present

## 2023-02-04 DIAGNOSIS — Z1231 Encounter for screening mammogram for malignant neoplasm of breast: Secondary | ICD-10-CM | POA: Diagnosis not present

## 2023-02-04 DIAGNOSIS — G4733 Obstructive sleep apnea (adult) (pediatric): Secondary | ICD-10-CM

## 2023-02-04 DIAGNOSIS — R4586 Emotional lability: Secondary | ICD-10-CM

## 2023-02-04 DIAGNOSIS — Z23 Encounter for immunization: Secondary | ICD-10-CM

## 2023-02-04 DIAGNOSIS — R262 Difficulty in walking, not elsewhere classified: Secondary | ICD-10-CM

## 2023-02-04 DIAGNOSIS — H66002 Acute suppurative otitis media without spontaneous rupture of ear drum, left ear: Secondary | ICD-10-CM

## 2023-02-04 DIAGNOSIS — B9620 Unspecified Escherichia coli [E. coli] as the cause of diseases classified elsewhere: Secondary | ICD-10-CM

## 2023-02-04 DIAGNOSIS — N390 Urinary tract infection, site not specified: Secondary | ICD-10-CM

## 2023-02-04 DIAGNOSIS — M25511 Pain in right shoulder: Secondary | ICD-10-CM

## 2023-02-04 DIAGNOSIS — M542 Cervicalgia: Secondary | ICD-10-CM | POA: Diagnosis not present

## 2023-02-04 LAB — POCT URINALYSIS DIP (CLINITEK)
Bilirubin, UA: NEGATIVE
Blood, UA: NEGATIVE
Glucose, UA: NEGATIVE mg/dL
Ketones, POC UA: NEGATIVE mg/dL
Leukocytes, UA: NEGATIVE
Nitrite, UA: NEGATIVE
POC PROTEIN,UA: NEGATIVE
Spec Grav, UA: 1.01 (ref 1.010–1.025)
Urobilinogen, UA: 0.2 E.U./dL
pH, UA: 6 (ref 5.0–8.0)

## 2023-02-04 MED ORDER — AZITHROMYCIN 250 MG PO TABS
ORAL_TABLET | ORAL | 0 refills | Status: DC
Start: 2023-02-04 — End: 2023-02-07

## 2023-02-04 NOTE — Progress Notes (Addendum)
Natasha Eth, DNP, AGNP-c [facility] [address] [place], [place] [address] [phone]  Subjective:   Natasha [name] is a 72 y.o. female presents to day for evaluation of several concerns.  Urinary Symptoms Natasha [name] reports experiencing urinary symptoms, although her recent urine test showed no abnormalities. She reports needing to get up 3-4 times per night for urination and chronic incontinence. She has tried multiple medications, but has not had success.   Leg Weakness Natasha [name] also expresses concern about episodes where her legs give out while walking, which she notes has become a safety concern, especially since she relies on a walker. She attributes this issue to a possible side effects of her medication, Robaxin, which she suspects might be contributing to her leg weakness and mood changes. She has held this medication to determine if this makes a difference.  Ear Pain and Symptoms Natasha [name] is troubled by a recent onset of pain in her left ear, describing it as disconcerting. She mentions a sensation of fluid and a runny nose, with a recent increase in chills and a consistently low body temperature.  Shoulder Pain She reports chronic shoulder pain, which has been somewhat managed with Methocarbamol, but she stopped due to stomach cramps and concerns about its side effects, including dizziness and impaired muscle mobility.   Groin Pain Natasha [name] describes a persistent pain in her groin, which she notes tends to improve with significant bowel movements or urination, suggesting a possible gastrointestinal or urinary issue. This has been a chronic concern in the RLQ.  Thyroid Levels and Increased Urination She also notes a concern with her thyroid levels, which were recently found to be low, and ongoing issues with increased urination urgency.  Emotional Distress Natasha [name] expresses emotional distress, mentioning feelings of sadness and depression despite recent professional  successes. She attributes some of her emotional state to the side effects of her medications and possibly the change from being very busy to no longer having the volume of work to do.   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 Natasha [name] feels that the intense effort and stress from this event have left her exhausted, which might be contributing to her current health issues.  OSA Patient currently utilizing CPAP for OSA. Using regularly.  Benefiting from use of this. No concerns.  PMH, Medications, and Allergies reviewed and updated in chart as appropriate.   ROS negative except for what is listed in HPI. Objective:  BP 132/78   Pulse 67   Wt (!) 310 lb (140.6 kg)   SpO2 95%   BMI 50.04 kg/m  Physical Exam Vitals and nursing note reviewed.  Constitutional:      General: She is not in acute distress.    Appearance: She is obese. She is not toxic-appearing.  HENT:     Head: Normocephalic.     Left Ear: A middle ear effusion is present.     Ears:     Comments: Clear effusion on left Eyes:     General: No scleral icterus.    Pupils: Pupils are equal, round, and reactive to light.  Cardiovascular:     Rate and Rhythm: Normal rate and regular rhythm.     Pulses: Normal pulses.     Heart sounds: Normal heart sounds.  Pulmonary:     Effort: Pulmonary effort is normal.     Breath sounds: Normal breath sounds.  Abdominal:     General:  Bowel sounds are normal. There is no distension.     Palpations: Abdomen is soft. There is no mass.     Tenderness: There is no abdominal tenderness. There is no right CVA tenderness, left CVA tenderness, guarding or rebound.  Musculoskeletal:        General: Normal range of motion.     Right lower leg: Edema present.     Left lower leg: Edema present.  Lymphadenopathy:     Cervical: No cervical adenopathy.  Skin:    General: Skin is warm and dry.      Capillary Refill: Capillary refill takes less than 2 seconds.  Neurological:     General: No focal deficit present.     Mental Status: She is alert and oriented to person, place, and time.     Sensory: No sensory deficit.     Motor: Weakness present.     Coordination: Coordination normal.     Gait: Gait abnormal.  Psychiatric:        Mood and Affect: Mood is depressed.           Assessment & Plan:   Problem List Items Addressed This Visit     OSA (obstructive sleep apnea)    Routine use of CPAP with benefit from ongoing use. Continue.       Acute pain of right shoulder    Shoulder pain and neck pain present, which she has been utilizing Robaxin for management. Unfortunately, she is experiencing side effects of weakness. I recommend discontinuation of Robaxin to determine if her symptoms improve. If no change or no significant improvement, we will need to consider neurology or orthopedic evaluation.       Dysuria - Primary    No concerning symptoms present on UA in the office today. Incontinence and urgency symptoms are chronic. She has not been through pelvic floor PT, but this has been recommended. It is unclear if this is progressively worsening. At this time, I feel the most appropriate course of action is to begin pelvic PT. She may want to consider urogynecology in addition, particularly if no improvement is noted.        Relevant Orders   POCT URINALYSIS DIP (CLINITEK) (Completed)   Urine Culture (Completed)   Ambulatory dysfunction    Chronic weakness with reliance on rolling walker for ambulation. This has progressively worsened with time and she is unable to walk with stability even with walker for more 20-30 yards without rest. It is unclear if the robaxin is contributing. She will [place] with her [place] in the near future. Given her ambulatory status and high risk of falls, a motorized wheelchair or scooter is recommended to aid in mobility. She would  like this order sent to a specific DME. She will let me know the name.       Non-recurrent acute suppurative otitis media of left ear without spontaneous rupture of tympanic membrane    Clear effusion with pressure and tenderness present. Given associated symptoms, will start treatment with azithromycin.       Mood changes    Patient reports low mood, possibly related to Methocarbamol use and recent stressors. Recommend discontinuation of Robaxin for a few days to assess if mood improves. Encourage patient to prioritize self-care, rest, and stress management. Monitor mood and consider referral to a mental health professional if symptoms persist or worsen      Neck pain   Relevant Orders   Ambulatory referral to Orthopedics  Other Visit Diagnoses     Need for tetanus booster       Need for shingles vaccine       Screening mammogram for breast cancer       E-coli UTI             Natasha Eth, DNP, AGNP-c 06/06/2023  6:41 AM    Time: 52 minutes, >50% spent counseling, care coordination, chart review, and documentation.    History, Medications, Surgery, SDOH, and Family History reviewed and updated as appropriate.

## 2023-02-04 NOTE — Patient Instructions (Addendum)
#  [phone] [place] [facility] - they do massage therapy.

## 2023-02-07 ENCOUNTER — Other Ambulatory Visit: Payer: Self-pay | Admitting: Medical

## 2023-02-07 MED ORDER — CIPROFLOXACIN HCL 500 MG PO TABS: 500.0000 mg | ORAL_TABLET | Freq: Two times a day (BID) | ORAL | 0 refills | Status: AC

## 2023-02-07 NOTE — Progress Notes (Signed)
Stop the azithromycin and change to 3 days of cipro.  Awaiting culture results  If worse over the weekend, particular fever, blood in urine, pain in mid back, then go to emergency dept if much worse

## 2023-02-07 NOTE — Progress Notes (Signed)
The preliminary urine culture results show infection.  Use the antibiotic Huntley Dec prescribed.  We will call when final culture results are available in the next few days.

## 2023-02-08 LAB — URINE CULTURE

## 2023-02-10 NOTE — Progress Notes (Signed)
The urine culture was positive for infection, and the bacteria is sensitive to Cipro.   As long as you are improving, finish the antibiotic as it should work.  If not improving, let me know.   The urine culture was positive for infection, and the bacteria is sensitive to Cipro.   As long as you are improving, finish the antibiotic as it should work.  If not improving, let me know.

## 2023-02-17 ENCOUNTER — Ambulatory Visit: Payer: Medicare PPO | Admitting: Bariatrics

## 2023-02-17 ENCOUNTER — Encounter: Payer: Self-pay | Admitting: Bariatrics

## 2023-02-17 VITALS — BP 164/80 | HR 66 | Temp 98.1°F | Ht 66.0 in | Wt 307.0 lb

## 2023-02-17 DIAGNOSIS — E559 Vitamin D deficiency, unspecified: Secondary | ICD-10-CM | POA: Diagnosis not present

## 2023-02-17 DIAGNOSIS — F5089 Other specified eating disorder: Secondary | ICD-10-CM

## 2023-02-17 DIAGNOSIS — Z6841 Body Mass Index (BMI) 40.0 and over, adult: Secondary | ICD-10-CM

## 2023-02-17 DIAGNOSIS — H66002 Acute suppurative otitis media without spontaneous rupture of ear drum, left ear: Secondary | ICD-10-CM | POA: Insufficient documentation

## 2023-02-17 DIAGNOSIS — I10 Essential (primary) hypertension: Secondary | ICD-10-CM | POA: Diagnosis not present

## 2023-02-17 DIAGNOSIS — H66004 Acute suppurative otitis media without spontaneous rupture of ear drum, recurrent, right ear: Secondary | ICD-10-CM | POA: Insufficient documentation

## 2023-02-17 DIAGNOSIS — R4586 Emotional lability: Secondary | ICD-10-CM | POA: Insufficient documentation

## 2023-02-17 HISTORY — DX: Acute suppurative otitis media without spontaneous rupture of ear drum, left ear: H66.002

## 2023-02-17 MED ORDER — TOPIRAMATE 50 MG PO TABS
50.0000 mg | ORAL_TABLET | Freq: Every day | ORAL | 0 refills | Status: DC
Start: 2023-02-17 — End: 2023-03-24

## 2023-02-17 MED ORDER — CIPROFLOXACIN HCL 500 MG PO TABS
500.0000 mg | ORAL_TABLET | Freq: Two times a day (BID) | ORAL | 0 refills | Status: AC
Start: 2023-02-17 — End: 2023-02-22

## 2023-02-17 MED ORDER — VITAMIN D (ERGOCALCIFEROL) 1.25 MG (50000 UNIT) PO CAPS
50000.0000 [IU] | ORAL_CAPSULE | ORAL | 0 refills | Status: AC
Start: 2023-02-17 — End: ?

## 2023-02-17 NOTE — Assessment & Plan Note (Signed)
Patient reports low mood, possibly related to Methocarbamol use and recent stressors. Recommend discontinuation of Robaxin for a few days to assess if mood improves. Encourage patient to prioritize self-care, rest, and stress management. Monitor mood and consider referral to a mental health professional if symptoms persist or worsen

## 2023-02-17 NOTE — Assessment & Plan Note (Addendum)
No concerning symptoms present on UA in the office today. Incontinence and urgency symptoms are chronic. She has not been through pelvic floor PT, but this has been recommended. It is unclear if this is progressively worsening. At this time, I feel the most appropriate course of action is to begin pelvic PT. She may want to consider urogynecology in addition, particularly if no improvement is noted.

## 2023-02-17 NOTE — Assessment & Plan Note (Signed)
Shoulder pain and neck pain present, which she has been utilizing Robaxin for management. Unfortunately, she is experiencing side effects of weakness. I recommend discontinuation of Robaxin to determine if her symptoms improve. If no change or no significant improvement, we will need to consider neurology or orthopedic evaluation.

## 2023-02-17 NOTE — Assessment & Plan Note (Signed)
>>  ASSESSMENT AND PLAN FOR AMBULATORY DYSFUNCTION WRITTEN ON 02/17/2023  7:33 AM BY Allyson Tineo E, NP  Chronic weakness with reliance on rolling walker for ambulation. This has progressively worsened with time and she is unable to walk with stability even with walker for more 20-30 yards without rest. It is unclear if the robaxin  is contributing. She will be [place] with her [place] in the near future. Given her ambulatory status and high risk of falls, a motorized wheelchair or scooter is recommended to aid in mobility. She would like this order sent to a specific DME. She will let me know the name.

## 2023-02-17 NOTE — Progress Notes (Signed)
WEIGHT SUMMARY AND BIOMETRICS  Weight Gained Since Last Visit: 3lb   Vitals Temp: 98.1 F (36.7 C) BP: (!) 164/80 Pulse Rate: 66 SpO2: 95 %   Anthropometric Measurements Height: 5\' 6"  (1.676 m) Weight: (!) 307 lb (139.3 kg) BMI (Calculated): 49.57 Weight at Last Visit: 304lb Weight Lost Since Last Visit: 0 Weight Gained Since Last Visit: 3lb Starting Weight: 317lb Total Weight Loss (lbs): 10 lb (4.536 kg)   Body Composition  Body Fat %: 56.5 % Fat Mass (lbs): 173.6 lbs Muscle Mass (lbs): 127 lbs Visceral Fat Rating : 23   Other Clinical Data Fasting: no Labs: no Today's Visit #: 5 Starting Date: 11/26/22    OBESITY Natasha [name] is here to discuss her progress with her obesity treatment plan along with follow-up of her obesity related diagnoses.     Nutrition Plan: the Category 3 plan - 0% adherence.  Current exercise: none  Interim History:  She is up 3 lbs since her last visit.  Not eating all of the food on the plan., Protein intake is as prescribed, and Is not skipping meals  Hunger is moderately controlled.  Cravings are moderately controlled.   Assessment/Plan:   1. Vitamin D deficiency  Vitamin D Deficiency Vitamin D is at goal of 50.  Most recent vitamin D level was 33.5. She is on  prescription ergocalciferol 50,000 IU weekly. Lab Results  Component Value Date   VD25OH 33.5 11/26/2022   VD25OH 30.2 10/11/2022   VD25OH 28.8 (L) 11/30/2021    Plan: Refill prescription vitamin D 50,000 IU weekly.   Hypertension Hypertension poorly controlled. She did not take her medication this morning.  Medication(s): Amlodipine 5 mg 1 daily  and Diovan 160 mg  BP Readings from Last 3 Encounters:  02/17/23 (!) 164/80  02/04/23 132/78  01/31/23 130/80   Lab Results  Component Value Date   CREATININE 0.79 11/26/2022   CREATININE 0.92 10/11/2022   CREATININE 0.89 07/12/2022     Plan: Continue all antihypertensives at current dosages.  Will take her medications consistently.  No added salt. Will keep sodium content to 1,500 mg or less per day.    Eating disorder/emotional eating Hazelle has had issues with stress eating, emotional eating, and boredom eating. Currently this is poorly controlled. Overall mood is stable. Denies suicidal/homicidal ideation. Medication(s): none  Plan:  Specifically regarding patient's less desirable eating habits and patterns, we employed the technique of small changes when she cannot fully commit to her prudent nutritional plan.  Discussed distractions to curb eating behaviors. Discussed activities to do with one's hands in the evening  Be sure to get adequate rest as lack of rest can trigger appetite.  Have plan in place for stressful events.  Consider other rewards besides food.     Morbid Obesity: Current BMI BMI (Calculated): 49.57    Natasha [name] is currently in the action stage of change. As such, her goal is to continue with weight loss efforts.  She has agreed to the Category 3 plan.  Exercise goals: Older adults should determine their level of effort for physical activity relative to their level of fitness.   Behavioral modification strategies: increasing lean protein intake, meal planning , planning for success, and increasing vegetables.  Mahayla has agreed to follow-up with our clinic in 4 weeks.        Objective:   VITALS: Per patient if applicable, see vitals. GENERAL: Alert and in no acute distress. CARDIOPULMONARY: No increased WOB. Speaking in clear sentences.  PSYCH: Pleasant and cooperative. Speech normal rate and rhythm. Affect is appropriate. Insight and judgement are appropriate. Attention is focused, linear, and appropriate.  NEURO: Oriented as arrived to appointment on time with no prompting.   Attestation Statements:    This was prepared with the assistance of Engineer, civil (consulting).  Occasional wrong-word or sound-a-like substitutions may have occurred due to  the inherent limitations of voice recognition software.   Corinna Capra, DO

## 2023-02-17 NOTE — Assessment & Plan Note (Signed)
Clear effusion with pressure and tenderness present. Given associated symptoms, will start treatment with azithromycin.

## 2023-02-17 NOTE — Assessment & Plan Note (Signed)
Chronic weakness with reliance on rolling walker for ambulation. This has progressively worsened with time and she is unable to walk with stability even with walker for more 20-30 yards without rest. It is unclear if the robaxin is contributing. She will [place] with her [place] in the near future. Given her ambulatory status and high risk of falls, a motorized wheelchair or scooter is recommended to aid in mobility. She would like this order sent to a specific DME. She will let me know the name.

## 2023-02-17 NOTE — Assessment & Plan Note (Signed)
>>  ASSESSMENT AND PLAN FOR DYSURIA WRITTEN ON 02/17/2023  7:30 AM BY Crews Mccollam E, NP  No concerning symptoms present on UA in the office today. Incontinence and urgency symptoms are chronic. She has not been through pelvic floor PT, but this has been recommended. It is unclear if this is progressively worsening. At this time, I feel the most appropriate course of action is to begin pelvic PT. She may want to consider urogynecology in addition, particularly if no improvement is noted.

## 2023-02-21 ENCOUNTER — Telehealth: Payer: Self-pay

## 2023-02-21 NOTE — Telephone Encounter (Signed)
Pt. Called stating she was confused if she was supposed to take another round of Cipro or not. She stated Vincenza Hews called in Cipro for her and she finished that then you called in Cipro for her on 02/17/23. She stats she is not having anymore UTI symptoms at this time but I told her I would double check with you just incase.

## 2023-03-03 ENCOUNTER — Telehealth: Payer: Self-pay

## 2023-03-03 DIAGNOSIS — R600 Localized edema: Secondary | ICD-10-CM

## 2023-03-03 DIAGNOSIS — M6281 Muscle weakness (generalized): Secondary | ICD-10-CM

## 2023-03-03 DIAGNOSIS — R0602 Shortness of breath: Secondary | ICD-10-CM

## 2023-03-03 DIAGNOSIS — Z9181 History of falling: Secondary | ICD-10-CM

## 2023-03-03 DIAGNOSIS — R269 Unspecified abnormalities of gait and mobility: Secondary | ICD-10-CM

## 2023-03-03 NOTE — Telephone Encounter (Signed)
Patient called our office today ay 3:08pm and left a voice message. She says that Les Pou sent her a Clinical cytogeneticist message informing her that she could do the paperwork for a mobility scooter. Patient contacted Medicare and was told that Les Pou would have to fill out the paperwork, then submit it to the medical equipment company. Patient is planning on going out of town soon and would like the scooter to be ready in time .She wants to get this process started this week.  Does patient need to schedule an office visit for face to face evaluation of mobility? Or will the visit from 02/04/2023 work?

## 2023-03-04 NOTE — Telephone Encounter (Signed)
Tried calling patient. Left a detailed message on voice message system (ok per DPR).

## 2023-03-05 NOTE — Telephone Encounter (Signed)
Patient returned my call. She would like to use a company called [employer]". Their phone number is [phone].  Their fax number is [phone].  Address: [address] [place] [address]

## 2023-03-10 DIAGNOSIS — Z9181 History of falling: Secondary | ICD-10-CM | POA: Insufficient documentation

## 2023-03-10 NOTE — Telephone Encounter (Signed)
Can you send an order for an electric scooter for her to the supply store requested? I have added diagnoses to this encounter. Thank you!

## 2023-03-13 ENCOUNTER — Ambulatory Visit: Payer: Medicare PPO | Admitting: Family Medicine

## 2023-03-13 ENCOUNTER — Encounter: Payer: Self-pay | Admitting: Family Medicine

## 2023-03-13 VITALS — BP 132/84 | HR 59 | Temp 98.8°F | Wt 305.6 lb

## 2023-03-13 DIAGNOSIS — R059 Cough, unspecified: Secondary | ICD-10-CM

## 2023-03-13 DIAGNOSIS — R600 Localized edema: Secondary | ICD-10-CM

## 2023-03-13 DIAGNOSIS — R6883 Chills (without fever): Secondary | ICD-10-CM

## 2023-03-13 DIAGNOSIS — Z7901 Long term (current) use of anticoagulants: Secondary | ICD-10-CM

## 2023-03-13 DIAGNOSIS — H6122 Impacted cerumen, left ear: Secondary | ICD-10-CM

## 2023-03-13 DIAGNOSIS — Z87440 Personal history of urinary (tract) infections: Secondary | ICD-10-CM | POA: Diagnosis not present

## 2023-03-13 LAB — POCT URINALYSIS DIP (CLINITEK)
Bilirubin, UA: NEGATIVE
Blood, UA: NEGATIVE
Glucose, UA: NEGATIVE mg/dL
Ketones, POC UA: NEGATIVE mg/dL
Nitrite, UA: NEGATIVE
POC PROTEIN,UA: NEGATIVE
Spec Grav, UA: 1.02 (ref 1.010–1.025)
Urobilinogen, UA: 0.2 E.U./dL
pH, UA: 5 (ref 5.0–8.0)

## 2023-03-13 LAB — CBC WITH DIFFERENTIAL/PLATELET
Basophils Absolute: 0 10*3/uL (ref 0.0–0.2)
Basos: 1 %
EOS (ABSOLUTE): 0.2 10*3/uL (ref 0.0–0.4)
Eos: 3 %
Hematocrit: 36.8 % (ref 34.0–46.6)
Hemoglobin: 12.2 g/dL (ref 11.1–15.9)
Immature Grans (Abs): 0 10*3/uL (ref 0.0–0.1)
Immature Granulocytes: 0 %
Lymphocytes Absolute: 1.7 10*3/uL (ref 0.7–3.1)
Lymphs: 27 %
MCH: 29.3 pg (ref 26.6–33.0)
MCHC: 33.2 g/dL (ref 31.5–35.7)
MCV: 88 fL (ref 79–97)
Monocytes Absolute: 1 10*3/uL — ABNORMAL HIGH (ref 0.1–0.9)
Monocytes: 16 %
Neutrophils Absolute: 3.3 10*3/uL (ref 1.4–7.0)
Neutrophils: 53 %
Platelets: 228 10*3/uL (ref 150–450)
RBC: 4.17 x10E6/uL (ref 3.77–5.28)
RDW: 13.5 % (ref 11.7–15.4)
WBC: 6.1 10*3/uL (ref 3.4–10.8)

## 2023-03-13 LAB — POCT INFLUENZA A/B
Influenza A, POC: NEGATIVE
Influenza B, POC: NEGATIVE

## 2023-03-13 LAB — POC COVID19 BINAXNOW: SARS Coronavirus 2 Ag: NEGATIVE

## 2023-03-13 NOTE — Patient Instructions (Addendum)
  Please stay well hydrated.   Use some over-the-counter debrox drops to help with the wax. If you continue to have any ear discomfort, or if you have trouble hearing, return for re-evaluation of the ear and removal of the wax.  I encourage you to try some mucinex (12 hour pill, probably just the plain one, since your cough might improve with loosening up the postnasal drainage). If your cough worsens, you can add Delsym syrup to the plain Mucinex, or switch instead to the Mucinex DM.  Your urine looked okay today--just a small amount of leukocytes, and a little concentrated. I'm not sending for culture based on you not really having much urinary symptoms. I would be afraid to find perhaps bacteria that is not causing an infection, and I'd feel obligated to treat it, and give you antibiotics that you may not need.  We are checking your blood to ensure that you haven't developed any anemia (from unclear losses related to being on blood thinners), and also this checks your white count, to look for infection.  Continue to monitor your temperature and pay attention to any new symptoms that may develop. Consider repeating your COVID test tomorrow (sometimes they can be falsely negative when checked in the first 1-2 days of symptoms). If positive, please contact the office).   Please go home and elevate your legs. Use your compression pump. You likely need a dose of lasix (but may want to wait until the morning to take it). Be sure to limit the sodium in your diet.

## 2023-03-13 NOTE — Progress Notes (Deleted)
  TeleHealth Visit:  This visit was completed with telemedicine (audio/video) technology. [name] has verbally consented to this TeleHealth visit. The patient is located at home, the provider is located at home. The participants in this visit include the listed provider and patient. The visit was conducted today via MyChart video.  OBESITY Natasha [name] is here to discuss her progress with her obesity treatment plan along with follow-up of her obesity related diagnoses.   Today's visit was # 6 Starting weight: 317 lbs Starting date: 11/26/22 Weight at last in office visit: 307 lbs on 02/17/23 Total weight loss: 10 lbs at last in office visit on 02/17/23. Today's reported weight (She complains of vaginal discharge for *** days; described as itchy, white, non bloody, without pelvic pain or abnormal vaginal bleeding.): {dwwweightreported:29243}  Nutrition Plan: {dwwsldiets:29085} - ***% adherence.  Current exercise: {exercise types:16438}  Interim History:  ***  Eating all of the prescribed protein: {yes***/no:17258} Skipping meals: {dwwyes:29172} Drinking adequate water: {dwwyes:29172} Drinking sugar sweetened beverages: {dwwyes:29172} Hunger controlled: {EWCONTROLASSESSMENT:24261}. Cravings controlled:  {EWCONTROLASSESSMENT:24261}.  Journaling Consistently:  {dwwyes:29172} Meeting protein goals:  {dwwyes:29172} Meeting calorie goals:  {dwwyes:29172}   Pharmacotherapy: Erisa is on {dwwpharmacotherapy:[address]109} Adverse side effects: {dwwse:29122} Hunger is {EWCONTROLASSESSMENT:24261}.  Cravings are {EWCONTROLASSESSMENT:24261}.  Assessment/Plan:  1. ***  2. ***  3. ***  {dwwmorbid:29108::"Morbid Obesity"}: Current BMI ***  Pharmacotherapy Plan {dwwmed:29123}  {dwwpharmacotherapy:[address]}  [name] {CHL AMB IS/IS NOT:210130109} currently in the action stage of change. As such, her goal is to {MWMwtloss#1:210800005}.  She has agreed to {dwwsldiets:29085}.  Exercise goals: {MWM EXERCISE  RECS:23473}  Behavioral modification strategies: {dwwslwtlossstrategies:29088}.  [name] has agreed to follow-up with our clinic in {NUMBER 1-10:22536} {dwwfutime:29619}  No orders of the defined types were placed in this encounter.   There are no discontinued medications.   No orders of the defined types were placed in this encounter.     Objective:   VITALS: Per patient if applicable, see vitals. GENERAL: Alert and in no acute distress. CARDIOPULMONARY: No increased WOB. Speaking in clear sentences.  PSYCH: Pleasant and cooperative. Speech normal rate and rhythm. Affect is appropriate. Insight and judgement are appropriate. Attention is focused, linear, and appropriate.  NEURO: Oriented as arrived to appointment on time with no prompting.   Attestation Statements:   Reviewed by clinician on day of visit: allergies, medications, problem list, medical history, surgical history, family history, social history, and previous encounter notes.  ***(delete if time-based billing not used) Time spent on visit including the items listed below was *** minutes.  -preparing to see the patient (e.g., review of tests, history, previous notes) -obtaining and/or reviewing separately obtained history -counseling and educating the patient/family/caregiver -documenting clinical information in the electronic or other health record -ordering medications, tests, or procedures -independently interpreting results and communicating results to the patient/ family/caregiver -referring and communicating with other health care professionals  -care coordination   This was prepared with the assistance of Engineer, civil (consulting).  Occasional wrong-word or sound-a-like substitutions may have occurred due to the inherent limitations of voice recognition software.

## 2023-03-13 NOTE — Progress Notes (Signed)
Chief Complaint  Patient presents with   other    Head ache, ear ache, fever, chills, tested negative Sunday for covid, symptoms have been going on for 3 weeks, cold all day long, legs are very swollen today both legs noticeable, AC    She has felt chilled, and unable to get warm for the last 3 weeks.  When it started 3 weeks ago, she recalls feeling bad, lethargic.   No body aches, no URI symptoms (occ cough).  This was similar to her symptom with COVID--she had a negative test this past Sunday.  She started with a L earache last night, along with a fever of 101. Pain was just below the left ear She hasn't had any tylenol or NSAIDs today.  Ear isn't painful at the moment.  She has a slight runny nose.  Mucus is clear. Nonproductive cough, feels PND, "a big hunk of yuck".  She has a slight dry cough, not new  No shortness of breath, or pain with breathing. She has h/o PE related to COVID.  She reports having some recurrent swelling in her feet for the last 2-3 weeks. Didn't realize how swollen they were until today. She has lasix at home, but hasn't take any in a long time.  She has a pump to use for her swelling as well, and will use this today, along with taking lasix.  4 weeks ago she had UTI. Symptoms (frequency and incontinence) improved. Never had dysuria. Reports having + culture with normal u/a's in the past, asking if culture should be sent. She currently denies frequency, incontinence, dysuria. At one point she reports she took antibiotics for a month.   PMH, PSH, SH reviewed  On xarelto since she had PE due to COVID infection in 2020 Chart reviewed--also had DVT in 2019.  Outpatient Encounter Medications as of 03/13/2023  Medication Sig Note   acetaminophen (TYLENOL) 500 MG tablet Take 1 tablet (500 mg total) by mouth every 6 (six) hours as needed. 03/13/2023: Prn    amLODipine (NORVASC) 5 MG tablet TAKE 1 TABLET BY MOUTH AT BEDTIME FOR BLOOD PRESSURE    LINZESS 145  MCG CAPS capsule Take 145 mcg by mouth daily before breakfast.    montelukast (SINGULAIR) 10 MG tablet Take 1 tablet (10 mg total) by mouth at bedtime.    Multiple Vitamin (MULTIVITAMIN WITH MINERALS) TABS tablet Take 1 tablet by mouth daily.    pantoprazole (PROTONIX) 40 MG tablet Take 1 tablet (40 mg total) by mouth daily.    rivaroxaban (XARELTO) 20 MG TABS tablet Take 1 tablet (20 mg total) by mouth daily with supper.    SYNTHROID 150 MCG tablet TAKE 1 TABLET BY MOUTH EVERY DAY    Trospium Chloride 60 MG CP24 Take 1 capsule (60 mg total) by mouth daily at 12 noon.    valsartan (DIOVAN) 160 MG tablet Take 1 tablet (160 mg total) by mouth daily.    Vitamin D, Ergocalciferol, (DRISDOL) 1.25 MG (50000 UNIT) CAPS capsule Take 1 capsule (50,000 Units total) by mouth every 7 (seven) days.    AREXVY 120 MCG/0.5ML injection Inject 0.5 mLs into the muscle once. (Patient not taking: Reported on 03/13/2023)    dicyclomine (BENTYL) 10 MG capsule Take 10 mg by mouth 4 (four) times daily -  before meals and at bedtime. (Patient not taking: Reported on 03/13/2023)    fluconazole (DIFLUCAN) 150 MG tablet Take one tablet by mouth at the first sign of symptoms of yeast. If no  resolution, repeat dose in 72 hours. (Patient not taking: Reported on 03/13/2023)    furosemide (LASIX) 20 MG tablet Take 1 tablet (20 mg total) by mouth daily. (Patient not taking: Reported on 03/13/2023) 03/13/2023: Prn will take when she gets home   Krill Oil 1000 MG CAPS Take 1,000 mg by mouth 2 (two) times daily. (Patient not taking: Reported on 03/13/2023)    methocarbamol (ROBAXIN) 500 MG tablet Take 1 tablet (500 mg total) by mouth 2 (two) times daily as needed for muscle spasms. (Patient not taking: Reported on 03/13/2023)    nystatin cream (MYCOSTATIN) Apply 1 Application topically 2 (two) times daily. (Patient not taking: Reported on 03/13/2023)    topiramate (TOPAMAX) 50 MG tablet Take 1 tablet (50 mg total) by mouth daily with supper.  (Patient not taking: Reported on 03/13/2023) 03/13/2023: Has not started yet   No facility-administered encounter medications on file as of 03/13/2023.   Allergies  Allergen Reactions   Codeine Shortness Of Breath, Palpitations and Other (See Comments)   Egg-Derived Products Nausea And Vomiting    PROJECTILE VOMITING   ROS:  fever last night, ear pain and mild resp sx per HPI. No n/v/d. She does have some constipation--finally eased off over the weekend. Had a normal bowel movement this morning. Denies abdominal pain, blood in the stool. No current urinary complaints. No rashes, bleeding, bruising. Moods are good.    PHYSICAL EXAM:  BP 132/84   Pulse (!) 59   Temp 98.8 F (37.1 C)   Wt (!) 305 lb 9.6 oz (138.6 kg)   SpO2 97%   BMI 49.33 kg/m   Pleasant, obese female, in no distress Periodic dry cough during visit. She is speaking easily and comfortably. HEENT: conjunctiva and sclera are clear. TM normal on the R. TM not well visualized due to cerumen on the left. No pain with movement or palpation of external ear.  Area of discomfort was below the ear--not tender, no lymphadenopathy noted. Nasal mucosa mild-mod edematous, no erythema or purulence. OP--slight area of eyrthema at L posterior tonsillar area. No tonsils present. No PND seen Neck: no lymphadenopathy or mass Heart: regular rate and rhythm Lungs: clear bilaterally, no wheezes, rales, ronchi Abdomen: obese, soft. Mildly tender in suprapubic area (chronic/unchanged, per pt) Back: no CVA tenderness Extremities: 1+ pretibial edema, 2+ at ankles No erythema, warmth. Skin is intact.  WHSS R ankle.  Urine dip--Small leuks, otherwise normal. COVID and flu tests negative   ASSESSMENT/PLAN:  Chills - x 3 weeks, with fever only last night. ?etiology. Check CBC. Repeat COVID test tomorrow - Plan: Influenza A/B, CBC with Differential/Platelet  Cough, unspecified type - dry cough, fairly chronic per pt.  Suspect related  to allergies/PND. Recommended Mucinex. Lungs clear - Plan: POC COVID-19  History of recurrent UTIs - asymptomatic today. Discussed asymptomatic bacteriuria. Preferred not to send for culture today. - Plan: POCT URINALYSIS DIP (CLINITEK)  Long term current use of anticoagulant - on xarelta since PE related to COVID. Will check for anemia given chronic anticoagulant use, and chills - Plan: CBC with Differential/Platelet  Impacted cerumen of left ear - encouraged to use Debrox drops. Return for re-eval and lavage if persistent ear discomfort. Do not suspect otitis  Bilateral lower extremity edema - symmetric, calves nontender. Encouraged leg elevation, low Na diet. To take Lasix, and use pump she has at home.   Records reviewed, and multiple references to need for lifelong Xarelto use. ? If can dose can be decreased, or  if needs to stay at 20mg  daily. Consider hematology consult/eval. I can't see that hypercoag w/u was done (was definitely hypercoaguable related to COVID; had prior DVT). Will send to PCP to consider this in future.    Please stay well hydrated.   Use some over-the-counter debrox drops to help with the wax. If you continue to have any ear discomfort, or if you have trouble hearing, return for re-evaluation of the ear and removal of the wax.  I encourage you to try some mucinex (12 hour pill, probably just the plain one, since your cough might improve with loosening up the postnasal drainage). If your cough worsens, you can add Delsym syrup to the plain Mucinex, or switch instead to the Mucinex DM.  Your urine looked okay today--just a small amount of leukocytes, and a little concentrated. I'm not sending for culture based on you not really having much urinary symptoms. I would be afraid to find perhaps bacteria that is not causing an infection, and I'd feel obligated to treat it, and give you antibiotics that you may not need.  We are checking your blood to ensure that you  haven't developed any anemia (from unclear losses related to being on blood thinners), and also this checks your white count, to look for infection.  Continue to monitor your temperature and pay attention to any new symptoms that may develop. Consider repeating your COVID test tomorrow (sometimes they can be falsely negative when checked in the first 1-2 days of symptoms). If positive, please contact the office).  Please go home and elevate your legs. Use your compression pump. You likely need a dose of lasix (but may want to wait until the morning to take it). Be sure to limit the sodium in your diet.

## 2023-03-17 ENCOUNTER — Telehealth (INDEPENDENT_AMBULATORY_CARE_PROVIDER_SITE_OTHER): Payer: Self-pay | Admitting: Family Medicine

## 2023-03-17 ENCOUNTER — Other Ambulatory Visit: Payer: Self-pay | Admitting: Bariatrics

## 2023-03-17 ENCOUNTER — Telehealth (INDEPENDENT_AMBULATORY_CARE_PROVIDER_SITE_OTHER): Payer: Medicare PPO | Admitting: Family Medicine

## 2023-03-17 DIAGNOSIS — Z6841 Body Mass Index (BMI) 40.0 and over, adult: Secondary | ICD-10-CM

## 2023-03-17 NOTE — Telephone Encounter (Signed)
Patient was a no-show for virtual visit.  Waited on the virtual visit until 6 minutes past appointment time per office policy.  Left VM advising patient to call to reschedule.  

## 2023-03-24 ENCOUNTER — Ambulatory Visit: Payer: Medicare PPO | Admitting: Medical

## 2023-03-24 VITALS — BP 110/70 | HR 68 | Temp 97.9°F | Wt 310.2 lb

## 2023-03-24 DIAGNOSIS — J40 Bronchitis, not specified as acute or chronic: Secondary | ICD-10-CM | POA: Diagnosis not present

## 2023-03-24 DIAGNOSIS — R350 Frequency of micturition: Secondary | ICD-10-CM | POA: Diagnosis not present

## 2023-03-24 DIAGNOSIS — M1710 Unilateral primary osteoarthritis, unspecified knee: Secondary | ICD-10-CM

## 2023-03-24 DIAGNOSIS — R300 Dysuria: Secondary | ICD-10-CM | POA: Diagnosis not present

## 2023-03-24 DIAGNOSIS — J988 Other specified respiratory disorders: Secondary | ICD-10-CM

## 2023-03-24 LAB — POCT URINALYSIS DIP (PROADVANTAGE DEVICE)
Bilirubin, UA: NEGATIVE
Blood, UA: NEGATIVE
Glucose, UA: NEGATIVE mg/dL
Ketones, POC UA: NEGATIVE mg/dL
Nitrite, UA: NEGATIVE
Protein Ur, POC: NEGATIVE mg/dL
Specific Gravity, Urine: 1.015
Urobilinogen, Ur: NEGATIVE
pH, UA: 6 (ref 5.0–8.0)

## 2023-03-24 MED ORDER — AMOXICILLIN 875 MG PO TABS: 875.0000 mg | ORAL_TABLET | Freq: Two times a day (BID) | ORAL | 0 refills | Status: AC

## 2023-03-24 MED ORDER — BENZONATATE 100 MG PO CAPS: 100.0000 mg | ORAL_CAPSULE | Freq: Two times a day (BID) | ORAL | 0 refills | Status: DC | PRN

## 2023-03-24 MED ORDER — DICLOFENAC SODIUM 1 % EX GEL: 2.0000 g | Freq: Four times a day (QID) | CUTANEOUS | 1 refills | Status: DC

## 2023-03-24 NOTE — Progress Notes (Signed)
Subjective:  Natasha [name] is a 72 y.o. female who presents for Chief Complaint  Patient presents with   Urinary Tract Infection    Having pain with urinating, been going on for a while. Going out of the country on Saturday and wants to make sure everything is ok. Having swelling in feet   Here for concerns.  Having burning and pain with urination, started 4 days ago.  But saw Dr. Lynelle Doctor for this recently as well.  No vaginal discharge, no itching  She also saw Dr. Lynelle Doctor recent respiratory tract infection.  Still coughing and has congestion.  Not a lot better but not worse.  No fever.  She wants something for her knee arthritis.  It flares up quite a bit.  No recent injury or trauma.  Using Tylenol over-the-counter.  She is going out of the country to [place] this weekend and will be there for 11 days.  Wants some help with her pain    No other aggravating or relieving factors.    No other c/o.  Past Medical History:  Diagnosis Date   Abdominal spasms 12/14/2020   Acute pulmonary embolism (HCC) 08/21/2019   Bacterial conjunctivitis of both eyes 12/14/2020   Bilateral leg edema 02/10/2020   Bradycardia    Breast cancer screening by mammogram 11/24/2020   CARCINOMA, THYROID GLAND, PAPILLARY 05/04/2008   Stage 2, 8/09: thyroidectomy for 2.7cm papillary adenocarcinoma (t2 n0 mo) 9/09: I-131 rx, 108 [facility] 05/10: tg is neg (ab neg) , total body scan is neg   Colon cancer screening 11/24/2020   Colon polyps    Complication of anesthesia    trouble with airway   Cough 12/25/2007   Qualifier: Diagnosis of  By: Thad Ranger LPN, Megan     FAOZH-08 08/19/2019   Diverticulosis    DVT (deep venous thrombosis) (HCC)    Edema 12/22/2008   Encounter to establish care 11/24/2020   History of 2019 novel coronavirus disease (COVID-19) 11/09/2019   HYPERTENSION 12/25/2007   HYPOTHYROIDISM, POSTSURGICAL 05/04/2008   LEG PAIN, LEFT 12/09/2008   Lumbago with sciatica, right side 01/08/2021   Memory loss due to  medical condition 11/09/2019   Muscle weakness    Nausea and vomiting 05/23/2008   Qualifier: Diagnosis of  By: Everardo All MD, Clayborn Heron of this note might be different from the original. Qualifier: Diagnosis of  By: Everardo All MD, Gregary Signs A   Numbness and tingling    OSA (obstructive sleep apnea) 06/15/2013   CPAP   Paresthesia 01/04/2020   Peripheral edema 12/22/2008   Qualifier: Diagnosis of  By: Eden Emms, MD, Harrington Challenger    Pneumonia due to COVID-19 virus    Pulmonary embolism (HCC)    Pulmonary embolism (HCC) 08/23/2019   Right lower quadrant pain 11/24/2020   Sciatica of left side 12/04/2011   Skin sensation disturbance 12/05/2008   Qualifier: Diagnosis of  By: Felicity Coyer MD, Vista Mink of this note might be different from the original. Qualifier: Diagnosis of  By: Felicity Coyer MD, Vikki Ports A   Spasm of muscle of lower back 12/14/2020   Vaginal candidiasis 03/16/2021   Weakness 01/04/2020   Current Outpatient Medications on File Prior to Visit  Medication Sig Dispense Refill   acetaminophen (TYLENOL) 500 MG tablet Take 1 tablet (500 mg total) by mouth every 6 (six) hours as needed. 30 tablet 0   amLODipine (NORVASC) 5 MG tablet TAKE 1 TABLET BY MOUTH AT BEDTIME FOR BLOOD PRESSURE 90  tablet 0   furosemide (LASIX) 20 MG tablet Take 1 tablet (20 mg total) by mouth daily. 90 tablet 3   montelukast (SINGULAIR) 10 MG tablet Take 1 tablet (10 mg total) by mouth at bedtime. 90 tablet 3   Multiple Vitamin (MULTIVITAMIN WITH MINERALS) TABS tablet Take 1 tablet by mouth daily.     nystatin cream (MYCOSTATIN) Apply 1 Application topically 2 (two) times daily. 30 g 0   pantoprazole (PROTONIX) 40 MG tablet Take 1 tablet (40 mg total) by mouth daily. 90 tablet 3   rivaroxaban (XARELTO) 20 MG TABS tablet Take 1 tablet (20 mg total) by mouth daily with supper. 90 tablet 3   SYNTHROID 150 MCG tablet TAKE 1 TABLET BY MOUTH EVERY DAY 90 tablet 1   Trospium Chloride 60 MG CP24 Take 1 capsule (60 mg  total) by mouth daily at 12 noon. 90 capsule 1   valsartan (DIOVAN) 160 MG tablet Take 1 tablet (160 mg total) by mouth daily. 90 tablet 3   Vitamin D, Ergocalciferol, (DRISDOL) 1.25 MG (50000 UNIT) CAPS capsule Take 1 capsule (50,000 Units total) by mouth every 7 (seven) days. 5 capsule 0   No current facility-administered medications on file prior to visit.     The following portions of the patient's history were reviewed and updated as appropriate: allergies, current medications, past family history, past medical history, past social history, past surgical history and problem list.  ROS Otherwise as in subjective above    Objective: BP 110/70   Pulse 68   Temp 97.9 F (36.6 C)   Wt (!) 310 lb 3.2 oz (140.7 kg)   BMI 50.07 kg/m   General appearance: alert, no distress, well developed, well nourished HEENT: normocephalic, sclerae anicteric, conjunctiva pink and moist, nares patent, no discharge or erythema, pharynx normal Neck: supple, no lymphadenopathy, no thyromegaly, no masses Heart: RRR, normal S1, S2, no murmurs Lungs: Slight decreased in general lung sounds but otherwise  no wheezes, rhonchi, or rales Abdomen: +bs, soft, non tender, non distended, no masses, no hepatomegaly, no splenomegaly Bony arthritic changes of both knees, range of motion relatively normal, no obvious swelling or laxity Pulses: 2+ radial pulses, 2+ pedal pulses, normal cap refill Ext: no edema    Assessment: Encounter Diagnoses  Name Primary?   Dysuria    Urine frequency Yes   Arthritis of knee    Bronchitis    Respiratory tract infection      Plan: Dysuria-no obvious infection.  Urine culture sent.  Begin amoxicillin for symptoms of urinary tract infection as well as a respiratory tract infection.  Will likely need to go and see urology for recurrent urinary symptoms, several visits at our office in recent months for this without obvious infection every time  Knee arthritis-avoid  NSAIDs.  We discussed using Tylenol as needed but no more than 2 g a day.  She will begin trial of Voltaren gel or other topical creams such as Aspercreme for pain.  Consider follow-up with orthopedics.  She declines x-rays today.  Respiratory tract infection-discussed symptoms, can continue over-the-counter supportive care and begin amoxicillin for both urinary and respiratory tract infection symptoms   Laurabeth was seen today for urinary tract infection.  Diagnoses and all orders for this visit:  Urine frequency -     POCT Urinalysis DIP (Proadvantage Device) -     Ambulatory referral to Urology  Dysuria -     POCT Urinalysis DIP (Proadvantage Device) -     Urine  Culture -     Ambulatory referral to Urology  Arthritis of knee  Bronchitis  Respiratory tract infection  Other orders -     amoxicillin (AMOXIL) 875 MG tablet; Take 1 tablet (875 mg total) by mouth 2 (two) times daily for 10 days. -     diclofenac Sodium (VOLTAREN ARTHRITIS PAIN) 1 % GEL; Apply 2 g topically 4 (four) times daily. -     benzonatate (TESSALON) 100 MG capsule; Take 1 capsule (100 mg total) by mouth 2 (two) times daily as needed for cough.    Follow up: pending labs

## 2023-03-26 ENCOUNTER — Other Ambulatory Visit: Payer: Self-pay | Admitting: Medical

## 2023-03-26 LAB — URINE CULTURE

## 2023-03-26 MED ORDER — NITROFURANTOIN MONOHYD MACRO 100 MG PO CAPS: 100.0000 mg | ORAL_CAPSULE | Freq: Two times a day (BID) | ORAL | 0 refills | Status: DC

## 2023-03-26 NOTE — Progress Notes (Signed)
Urine culture show the amoxicillin will NOT work.  Change to different antibiotic Macrobid.  Stop amoxicillin  Still continue plan to see urology

## 2023-03-26 NOTE — Progress Notes (Signed)
Results sent through MyChart

## 2023-04-15 DIAGNOSIS — N3946 Mixed incontinence: Secondary | ICD-10-CM | POA: Diagnosis not present

## 2023-04-15 DIAGNOSIS — N3020 Other chronic cystitis without hematuria: Secondary | ICD-10-CM | POA: Diagnosis not present

## 2023-04-20 ENCOUNTER — Other Ambulatory Visit (HOSPITAL_BASED_OUTPATIENT_CLINIC_OR_DEPARTMENT_OTHER): Payer: Self-pay | Admitting: Nurse Practitioner

## 2023-04-20 DIAGNOSIS — E039 Hypothyroidism, unspecified: Secondary | ICD-10-CM

## 2023-04-21 ENCOUNTER — Other Ambulatory Visit (HOSPITAL_BASED_OUTPATIENT_CLINIC_OR_DEPARTMENT_OTHER): Payer: Self-pay | Admitting: Cardiology

## 2023-04-21 ENCOUNTER — Other Ambulatory Visit (HOSPITAL_BASED_OUTPATIENT_CLINIC_OR_DEPARTMENT_OTHER): Payer: Self-pay | Admitting: Nurse Practitioner

## 2023-04-21 ENCOUNTER — Other Ambulatory Visit: Payer: Self-pay | Admitting: Bariatrics

## 2023-04-21 DIAGNOSIS — R1031 Right lower quadrant pain: Secondary | ICD-10-CM

## 2023-04-21 DIAGNOSIS — I10 Essential (primary) hypertension: Secondary | ICD-10-CM

## 2023-04-21 DIAGNOSIS — R600 Localized edema: Secondary | ICD-10-CM

## 2023-04-21 DIAGNOSIS — G8929 Other chronic pain: Secondary | ICD-10-CM

## 2023-04-21 DIAGNOSIS — E039 Hypothyroidism, unspecified: Secondary | ICD-10-CM

## 2023-04-21 DIAGNOSIS — E559 Vitamin D deficiency, unspecified: Secondary | ICD-10-CM

## 2023-04-22 ENCOUNTER — Other Ambulatory Visit (HOSPITAL_BASED_OUTPATIENT_CLINIC_OR_DEPARTMENT_OTHER): Payer: Self-pay | Admitting: Nurse Practitioner

## 2023-04-22 DIAGNOSIS — E039 Hypothyroidism, unspecified: Secondary | ICD-10-CM

## 2023-04-22 NOTE — Telephone Encounter (Signed)
Please call pt to schedule overdue follow-up appointment with Dr. Cristal Deer or APP for refills. Last ov 12/2021--was due to follow-up 3 weeks later. Thank you!

## 2023-04-23 DIAGNOSIS — G4733 Obstructive sleep apnea (adult) (pediatric): Secondary | ICD-10-CM | POA: Diagnosis not present

## 2023-04-23 DIAGNOSIS — N816 Rectocele: Secondary | ICD-10-CM | POA: Diagnosis not present

## 2023-04-23 DIAGNOSIS — N8110 Cystocele, unspecified: Secondary | ICD-10-CM | POA: Diagnosis not present

## 2023-04-23 DIAGNOSIS — R600 Localized edema: Secondary | ICD-10-CM | POA: Diagnosis not present

## 2023-04-23 DIAGNOSIS — R351 Nocturia: Secondary | ICD-10-CM | POA: Diagnosis not present

## 2023-04-23 NOTE — Telephone Encounter (Signed)
Left message for patient to call and schedule over due follow up with Dr. Christopher / APP for medication refills 

## 2023-04-25 ENCOUNTER — Encounter (HOSPITAL_BASED_OUTPATIENT_CLINIC_OR_DEPARTMENT_OTHER): Payer: Self-pay

## 2023-04-25 DIAGNOSIS — N3020 Other chronic cystitis without hematuria: Secondary | ICD-10-CM | POA: Diagnosis not present

## 2023-04-25 NOTE — Telephone Encounter (Signed)
My Chart message sent

## 2023-04-25 NOTE — Telephone Encounter (Signed)
Left message for patient to call and schedule over due follow up with Dr. Christopher / APP for medication refills 

## 2023-04-29 ENCOUNTER — Encounter (HOSPITAL_BASED_OUTPATIENT_CLINIC_OR_DEPARTMENT_OTHER): Payer: Self-pay | Admitting: Cardiology

## 2023-04-29 NOTE — Telephone Encounter (Signed)
Left message for patient to call and schedule over due follow up with Dr. Cristal Deer / APP for medication refills   Will also mail letter requesting patient contact office to schedule

## 2023-05-06 ENCOUNTER — Telehealth (INDEPENDENT_AMBULATORY_CARE_PROVIDER_SITE_OTHER): Payer: Self-pay | Admitting: Bariatrics

## 2023-05-06 NOTE — Telephone Encounter (Signed)
Pt called requesting to see Dr. Val Eagle due to long travel to [place].

## 2023-05-07 ENCOUNTER — Ambulatory Visit: Payer: Medicare PPO | Admitting: Medical

## 2023-05-07 VITALS — BP 120/74 | HR 65 | Temp 97.4°F | Wt 318.8 lb

## 2023-05-07 DIAGNOSIS — I10 Essential (primary) hypertension: Secondary | ICD-10-CM | POA: Diagnosis not present

## 2023-05-07 DIAGNOSIS — G4733 Obstructive sleep apnea (adult) (pediatric): Secondary | ICD-10-CM

## 2023-05-07 DIAGNOSIS — I700 Atherosclerosis of aorta: Secondary | ICD-10-CM

## 2023-05-07 DIAGNOSIS — R600 Localized edema: Secondary | ICD-10-CM

## 2023-05-07 DIAGNOSIS — E890 Postprocedural hypothyroidism: Secondary | ICD-10-CM | POA: Diagnosis not present

## 2023-05-07 DIAGNOSIS — R0789 Other chest pain: Secondary | ICD-10-CM

## 2023-05-07 DIAGNOSIS — Z86711 Personal history of pulmonary embolism: Secondary | ICD-10-CM

## 2023-05-07 DIAGNOSIS — Z6841 Body Mass Index (BMI) 40.0 and over, adult: Secondary | ICD-10-CM

## 2023-05-07 DIAGNOSIS — R7309 Other abnormal glucose: Secondary | ICD-10-CM | POA: Diagnosis not present

## 2023-05-07 DIAGNOSIS — R609 Edema, unspecified: Secondary | ICD-10-CM | POA: Diagnosis not present

## 2023-05-07 MED ORDER — AMLODIPINE BESYLATE 5 MG PO TABS
5.0000 mg | ORAL_TABLET | Freq: Every day | ORAL | 0 refills | Status: DC
Start: 2023-05-07 — End: 2023-12-10

## 2023-05-07 MED ORDER — VALSARTAN 160 MG PO TABS
160.0000 mg | ORAL_TABLET | Freq: Every day | ORAL | 0 refills | Status: DC
Start: 2023-05-07 — End: 2024-01-27

## 2023-05-07 MED ORDER — POTASSIUM CHLORIDE ER 10 MEQ PO TBCR: 10.0000 meq | EXTENDED_RELEASE_TABLET | Freq: Two times a day (BID) | ORAL | 0 refills | Status: DC

## 2023-05-07 MED ORDER — FUROSEMIDE 20 MG PO TABS: 20.0000 mg | ORAL_TABLET | Freq: Two times a day (BID) | ORAL | 0 refills | Status: DC

## 2023-05-07 NOTE — Patient Instructions (Addendum)
Increase Lasix/Furosemide by taking 2 tablets daily or 40mg  daily  For for today Wednesday, take a second Lasix when you get home, and a 1/2 tablet this evening of lasix, before bedtime.  But starting tomorrow, do 2 Lasix in the morning.  Begin potassium while you are on the additional Lasix.  We will check some labs today  We will work on referral for CPAP  Elevate the legs the next days  Check your weights daily at home and write them down the next few days if possible.   You have had a recent 8lb increase in weight  Keep fluid intake consistent currently.  Try to aim for 80-100 ounces daily of water.  If any worse, such as worse chest pain, worse breathing, ext,  call 911 or go to the emergency dept
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
## 2023-05-07 NOTE — Progress Notes (Signed)
Sent order to [facility] with [employer] to get cpap machine to supplies

## 2023-05-07 NOTE — Addendum Note (Signed)
Addended by: Herminio Commons A on: 05/07/2023 12:04 PM   Modules accepted: Orders

## 2023-05-08 ENCOUNTER — Other Ambulatory Visit: Payer: Self-pay | Admitting: Medical

## 2023-05-08 LAB — BASIC METABOLIC PANEL
BUN/Creatinine Ratio: 17 (ref 12–28)
BUN: 15 mg/dL (ref 8–27)
CO2: 26 mmol/L (ref 20–29)
Calcium: 9.5 mg/dL (ref 8.7–10.3)
Chloride: 105 mmol/L (ref 96–106)
Creatinine, Ser: 0.86 mg/dL (ref 0.57–1.00)
Glucose: 83 mg/dL (ref 70–99)
Potassium: 4 mmol/L (ref 3.5–5.2)
Sodium: 143 mmol/L (ref 134–144)
eGFR: 72 mL/min/{1.73_m2} (ref >59)

## 2023-05-08 LAB — CBC
Hematocrit: 38.3 % (ref 34.0–46.6)
Hemoglobin: 12.4 g/dL (ref 11.1–15.9)
MCH: 28.6 pg (ref 26.6–33.0)
MCHC: 32.4 g/dL (ref 31.5–35.7)
MCV: 89 fL (ref 79–97)
Platelets: 273 10*3/uL (ref 150–450)
RBC: 4.33 x10E6/uL (ref 3.77–5.28)
RDW: 13.9 % (ref 11.7–15.4)
WBC: 6.6 10*3/uL (ref 3.4–10.8)

## 2023-05-08 LAB — BRAIN NATRIURETIC PEPTIDE: BNP: 21.2 pg/mL (ref 0.0–100.0)

## 2023-05-08 LAB — TSH+FREE T4
Free T4: 2.05 ng/dL — ABNORMAL HIGH (ref 0.82–1.77)
TSH: 0.298 u[IU]/mL — ABNORMAL LOW (ref 0.450–4.500)

## 2023-05-08 MED ORDER — LEVOTHYROXINE SODIUM 125 MCG PO TABS
125.0000 ug | ORAL_TABLET | Freq: Every day | ORAL | 0 refills | Status: AC
Start: 2023-05-08 — End: 2024-05-07

## 2023-05-08 NOTE — Addendum Note (Signed)
Addended byIva Lento T on: 05/08/2023 03:27 PM   Modules accepted: Orders

## 2023-05-08 NOTE — Progress Notes (Signed)
BNP heart marker normal  Follow-up next week for recheck

## 2023-05-08 NOTE — Progress Notes (Signed)
Results sent through MyChart

## 2023-05-12 ENCOUNTER — Other Ambulatory Visit: Payer: Self-pay | Admitting: Bariatrics

## 2023-05-12 DIAGNOSIS — R0683 Snoring: Secondary | ICD-10-CM

## 2023-05-14 ENCOUNTER — Ambulatory Visit (INDEPENDENT_AMBULATORY_CARE_PROVIDER_SITE_OTHER): Payer: Medicare PPO | Admitting: Medical

## 2023-05-14 VITALS — BP 122/76 | HR 63 | Wt 318.0 lb

## 2023-05-14 DIAGNOSIS — G4733 Obstructive sleep apnea (adult) (pediatric): Secondary | ICD-10-CM | POA: Diagnosis not present

## 2023-05-14 DIAGNOSIS — K8689 Other specified diseases of pancreas: Secondary | ICD-10-CM

## 2023-05-14 DIAGNOSIS — N952 Postmenopausal atrophic vaginitis: Secondary | ICD-10-CM

## 2023-05-14 DIAGNOSIS — M6289 Other specified disorders of muscle: Secondary | ICD-10-CM | POA: Diagnosis not present

## 2023-05-14 DIAGNOSIS — R5383 Other fatigue: Secondary | ICD-10-CM | POA: Diagnosis not present

## 2023-05-14 DIAGNOSIS — I10 Essential (primary) hypertension: Secondary | ICD-10-CM

## 2023-05-14 DIAGNOSIS — E039 Hypothyroidism, unspecified: Secondary | ICD-10-CM

## 2023-05-14 DIAGNOSIS — N3281 Overactive bladder: Secondary | ICD-10-CM | POA: Diagnosis not present

## 2023-05-14 NOTE — Progress Notes (Addendum)
Subjective:  Natasha [name] is a 72 y.o. female who presents for Chief Complaint  Patient presents with   follow-up    Follow-up on heart marker     Here for follow-up.  I saw her last week for body of symptoms including shortness of breath, general fatigue, and ongoing urinary abdominal issues.  She sees urology, uses a cream for atrophic vaginitis and uses Rance Muir for overactive bladder.  She still has problems sleeping because she gets up at least 2 different times in the night at 2 AM and 4 AM to use the bathroom.  She has a history of pancreatic insufficiency and just recently started back on pancreas enzymes that took months to get approval from insurance.  Sees GI for this.  Last week we had her increase her Lasix to twice daily along with potassium twice daily for suspected fluid overload and shortness of breath.  She feels improvement but no change in legs or swelling.  No significant swelling today.  Last visit her thyroid labs were overcorrected as we lowered her dose.  She is compliant with the new dose of 125 mcg levothyroxine  Her CPAP recently quit working.  She never heard back from last week's visit about next steps.  She does use CPAP and benefits from the device.  She has been significantly fatigued feeling since it has stopped working.  Compared to last week her chest does feel better, less short of breath but still feels tired and cold.  She notes has seen the heart doctor in the past but not in recent years.  She did have an ultrasound of her heart last year  She reports compliance with her blood pressure and thyroid medications.  No other aggravating or relieving factors.    No other c/o.  Past Medical History:  Diagnosis Date   Abdominal spasms 12/14/2020   Acute pulmonary embolism (HCC) 08/21/2019   Bacterial conjunctivitis of both eyes 12/14/2020   Bilateral leg edema 02/10/2020   Bradycardia    Breast cancer screening by mammogram 11/24/2020   CARCINOMA,  THYROID GLAND, PAPILLARY 05/04/2008   Stage 2, 8/09: thyroidectomy for 2.7cm papillary adenocarcinoma (t2 n0 mo) 9/09: I-131 rx, 108 [facility]ci 05/10: tg is neg (ab neg) , total body scan is neg   Colon cancer screening 11/24/2020   Colon polyps    Complication of anesthesia    trouble with airway   Cough 12/25/2007   Qualifier: Diagnosis of  By: Thad Ranger LPN, Megan     [id number] 08/19/2019   Diverticulosis    DVT (deep venous thrombosis) (HCC)    Edema 12/22/2008   Encounter to establish care 11/24/2020   History of 2019 novel coronavirus disease (COVID-19) 11/09/2019   HYPERTENSION 12/25/2007   HYPOTHYROIDISM, POSTSURGICAL 05/04/2008   LEG PAIN, LEFT 12/09/2008   Lumbago with sciatica, right side 01/08/2021   Memory loss due to medical condition 11/09/2019   Muscle weakness    Nausea and vomiting 05/23/2008   Qualifier: Diagnosis of  By: Everardo All MD, Clayborn Heron of this note might be different from the original. Qualifier: Diagnosis of  By: Everardo All MD, Gregary Signs A   Numbness and tingling    OSA (obstructive sleep apnea) 06/15/2013   CPAP   Paresthesia 01/04/2020   Peripheral edema 12/22/2008   Qualifier: Diagnosis of  By: Eden Emms, MD, Harrington Challenger    Pneumonia due to COVID-19 virus    Pulmonary embolism [facility])    Pulmonary embolism (HCC) 08/23/2019  Right lower quadrant pain 11/24/2020   Sciatica of left side 12/04/2011   Skin sensation disturbance 12/05/2008   Qualifier: Diagnosis of  By: Felicity Coyer MD, Vista Mink of this note might be different from the original. Qualifier: Diagnosis of  By: Felicity Coyer MD, Vikki Ports A   Spasm of muscle of lower back 12/14/2020   Vaginal candidiasis 03/16/2021   Weakness 01/04/2020   Current Outpatient Medications on File Prior to Visit  Medication Sig Dispense Refill   amLODipine (NORVASC) 5 MG tablet Take 1 tablet (5 mg total) by mouth daily. 90 tablet 0   estradiol (ESTRACE) 0.1 MG/GM vaginal cream PLEASE SEE ATTACHED FOR DETAILED DIRECTIONS     furosemide  (LASIX) 20 MG tablet Take 1 tablet (20 mg total) by mouth 2 (two) times daily. 180 tablet 0   levothyroxine (SYNTHROID) 125 MCG tablet Take 1 tablet (125 mcg total) by mouth daily. 30 tablet 0   montelukast (SINGULAIR) 10 MG tablet Take 1 tablet (10 mg total) by mouth at bedtime. 90 tablet 3   Multiple Vitamin (MULTIVITAMIN WITH MINERALS) TABS tablet Take 1 tablet by mouth daily.     nystatin cream (MYCOSTATIN) Apply 1 Application topically 2 (two) times daily. 30 g 0   pantoprazole (PROTONIX) 40 MG tablet Take 1 tablet (40 mg total) by mouth daily. 90 tablet 3   potassium chloride (KLOR-CON) 10 MEQ tablet Take 1 tablet (10 mEq total) by mouth 2 (two) times daily. 60 tablet 0   rivaroxaban (XARELTO) 20 MG TABS tablet Take 1 tablet (20 mg total) by mouth daily with supper. 90 tablet 3   valsartan (DIOVAN) 160 MG tablet Take 1 tablet (160 mg total) by mouth daily. 90 tablet 0   Vibegron 75 MG TABS Take by mouth.     Vitamin D, Ergocalciferol, (DRISDOL) 1.25 MG (50000 UNIT) CAPS capsule Take 1 capsule (50,000 Units total) by mouth every 7 (seven) days. 5 capsule 0   ZENPEP 40000-126000 units CPEP Take 2 capsules by mouth 3 (three) times daily.     acetaminophen (TYLENOL) 500 MG tablet Take 1 tablet (500 mg total) by mouth every 6 (six) hours as needed. 30 tablet 0   No current facility-administered medications on file prior to visit.     The following portions of the patient's history were reviewed and updated as appropriate: allergies, current medications, past family history, past medical history, past social history, past surgical history and problem list.  ROS Otherwise as in subjective above    Objective: BP 122/76   Pulse 63   Wt (!) 318 lb (144.2 kg)   BMI 51.33 kg/m   Wt Readings from Last 3 Encounters:  05/14/23 (!) 318 lb (144.2 kg)  05/07/23 (!) 318 lb 12.8 oz (144.6 kg)  03/24/23 (!) 310 lb 3.2 oz (140.7 kg)   BP Readings from Last 3 Encounters:  05/14/23 122/76   05/07/23 120/74  03/24/23 110/70    General appearance: alert, no distress, well developed, well nourished, seated with rolling walker Neck: supple, no lymphadenopathy, no thyromegaly, no masses, no JVD or bruits Heart: RRR, normal S1, S2, no murmurs Lungs: No obvious Rales, CTA bilaterally, no wheezes, rhonchi abdomen: +bs, soft, non tender, non distended, no masses, no hepatomegaly, no splenomegaly Pulses: 2+ radial pulses, 1+ pedal pulses, normal cap refill Ext: 1+ nonpitting bilateral lower extremity edema MSK: Nontender of arms and legs in general    Assessment: Encounter Diagnoses  Name Primary?   OSA (obstructive sleep apnea) Yes  Other fatigue    OAB (overactive bladder)    Essential hypertension    Atrophic vaginitis    Pelvic floor dysfunction    Hypothyroidism, unspecified type    Pancreatic insufficiency       Plan: Fatigue-we reviewed her recent lab results from last week.  Fortunately the BNP marker was normal.  Likely due in part to her sleep apnea  OSA-pending referral for updated CPAP supplies since her machine has malfunctioned.  She benefits from use of CPAP and notices when she doesn't have it working.  Overactive bladder-on Gemtesa per urology.  Pelvic floor dysfunction-referral to pelvic physical therapy  Atrophic vaginitis-on topical cream per urology  Hypertension-continue current medications amlodipine 5 mg daily, valsartan 160 mg daily, Lasix.  Changed back to once daily on the Lasix  Edema -no major change from last week when we doubled up on Lasix  Hypothyroidism-at her last visit last week her labs showed thyroid overcorrected.  We lowered her dose to 125 mcg daily from daily.  Pancreatic insufficiency-although she was diagnosed months ago, she said the insurance just now approved her medication and she just now started pancreas enzymes this past week.  Sees GI for this  There are no Patient Instructions on file for this  visit.   Natasha [name] was seen today for follow-up.  Diagnoses and all orders for this visit:  OSA (obstructive sleep apnea)  Other fatigue  OAB (overactive bladder)  Essential hypertension  Atrophic vaginitis  Pelvic floor dysfunction  Hypothyroidism, unspecified type  Pancreatic insufficiency     Follow up: pending call back

## 2023-05-14 NOTE — Addendum Note (Signed)
Addended by: Herminio Commons A on: 05/14/2023 02:45 PM   Modules accepted: Orders

## 2023-05-14 NOTE — Progress Notes (Signed)
I have put referral in Epic for PT and have contacted Matt with [employer] on status of CPAP. I do not believe she needs to see neuro unless she news a new sleep study in which we could do Snap diagnostic. This order was placed before her appointment last week but healthy weight and wellness

## 2023-06-02 ENCOUNTER — Ambulatory Visit (INDEPENDENT_AMBULATORY_CARE_PROVIDER_SITE_OTHER): Payer: Medicare PPO | Admitting: Family Medicine

## 2023-06-02 ENCOUNTER — Encounter (INDEPENDENT_AMBULATORY_CARE_PROVIDER_SITE_OTHER): Payer: Self-pay

## 2023-06-03 ENCOUNTER — Other Ambulatory Visit: Payer: Self-pay | Admitting: Medical

## 2023-06-03 ENCOUNTER — Ambulatory Visit (INDEPENDENT_AMBULATORY_CARE_PROVIDER_SITE_OTHER): Payer: Medicare PPO | Admitting: Family Medicine

## 2023-06-03 ENCOUNTER — Telehealth: Payer: Self-pay

## 2023-06-03 ENCOUNTER — Encounter (INDEPENDENT_AMBULATORY_CARE_PROVIDER_SITE_OTHER): Payer: Self-pay | Admitting: Family Medicine

## 2023-06-03 VITALS — BP 146/89 | HR 60 | Temp 98.8°F | Ht 66.0 in | Wt 320.0 lb

## 2023-06-03 DIAGNOSIS — I10 Essential (primary) hypertension: Secondary | ICD-10-CM | POA: Diagnosis not present

## 2023-06-03 DIAGNOSIS — E559 Vitamin D deficiency, unspecified: Secondary | ICD-10-CM

## 2023-06-03 DIAGNOSIS — Z6841 Body Mass Index (BMI) 40.0 and over, adult: Secondary | ICD-10-CM | POA: Diagnosis not present

## 2023-06-03 DIAGNOSIS — K8689 Other specified diseases of pancreas: Secondary | ICD-10-CM

## 2023-06-03 DIAGNOSIS — E65 Localized adiposity: Secondary | ICD-10-CM

## 2023-06-03 MED ORDER — VITAMIN D (ERGOCALCIFEROL) 1.25 MG (50000 UNIT) PO CAPS
50000.0000 [IU] | ORAL_CAPSULE | ORAL | 0 refills | Status: DC
Start: 2023-06-03 — End: 2024-03-25

## 2023-06-03 NOTE — Progress Notes (Signed)
Natasha [name], D.O.  ABFM, ABOM Specializing in Clinical Bariatric Medicine  Office located at: [address] [place], [place]  [address]     Assessment and Plan:   Medications Discontinued During This Encounter  Medication Reason   Vitamin D, Ergocalciferol, (DRISDOL) 1.25 MG (50000 UNIT) CAPS capsule Reorder     Meds ordered this encounter  Medications   Vitamin D, Ergocalciferol, (DRISDOL) 1.25 MG (50000 UNIT) CAPS capsule    Sig: Take 1 capsule (50,000 Units total) by mouth every 7 (seven) days.    Dispense:  5 capsule    Refill:  0    Pancreatic insufficiency Assessment & Plan: Pt was diagnosed with pancreatic insufficiency several months ago & she recently has been taking pancreatic enzymes per her GI doctor at [facility].  I do not have that doctor's notes for my review.   Pt has a "reflux type" pain that travels from upper abdomen to the left side of chest & these symptoms have not changed in several months, 12+ mo.  Pt endorses that these sx have been evaluated by her PCP and also by her GI doc. She went to PCP recently on 05/14/23 ( OV note read and labs, tests, studies etc I also reviewed) and she discussed these concerns again, as documented in the OV note, and no further work up was recommended.   Pt endorses these symptoms are stable and she even had an echo in 12/2021 for these concerns, which was found to be WNL's with an EF 55-60%.   Treatment f/up with Laurette Schimke for this chronic condition and start healthy meal plan.  Had long discussion with pt that fried and fatty foods, as well as sugary/ carby foods- ie donuts can increase the stress on her pancreas and worsen sx.  Begin to decrease simple carbs/ sugars; and decrease fatty foods -> in other words, follow her meal plan.  F/up with GI doctor regularly for chronic care.    Visceral obesity Assessment & Plan: Current visceral fat rating: 28.  Extensive counseling done. The visceral fat rating should be <  12 in a female.  Visceral adipose tissue is a hormonally active component of total body fat. This body composition phenotype is associated with medical disorders such as metabolic syndrome, cardiovascular disease and several malignancies including prostate, breast, and colorectal cancers. Txmnt Goal: Lose 7-10% of weight via prudent nutritional plan and lifestyle changes.     Essential hypertension Assessment & Plan: Last 3 blood pressure readings in our office are as follows: BP Readings from Last 3 Encounters:  06/03/23 (!) 146/89  05/14/23 122/76  05/07/23 120/74   Pt endorses taking her amlodipine 5 mg daily, valsartan 160 mg daily, and Lasix prn per PCP; denies new symptoms or concerns.  Tolerating well. BP above target. Pt completely asymptomatic. Pt instructed to check bp at home 2-3 times a week. C/w all antihypertensives per PCP/specialists & our low sodium diet.   Vitamin D deficiency Assessment & Plan: Last vitamin D was 33.5 roughly 6 mos ago. Has not been taking Rx vitamin D 50,000 units every 7 days due to being lost to f/up. C/w their weight loss efforts and high-dose vitamin D - Will refill ERGO today.      BMI 50.0-59.9, adult (HCC) - current BMI 51.67 Morbid obesity (HCC) Assessment & Plan: Since last office visit on 02/17/23 patient's muscle mass has decreased by 31.6 lb. Fat mass has increased by 46 lb. Counseling done on how various  foods will affect these numbers and how to maximize success  Total lbs lost to date: + 3 lbs Total weight loss percentage to date: 0.95%  Meal Plan: Start the Category 2 meal plan with B/L options   Behavioral Intervention Additional resources provided today: Cat 2 meal plan information, Breakfast options, lunch options   Evidence-based interventions for health behavior change were utilized today including the discussion of self monitoring techniques, problem-solving barriers and SMART goal setting techniques.   Regarding patient's less  desirable eating habits and patterns, we employed the technique of small changes.  Pt will specifically work on: measuring proteins and veggies & eating all the foods on her prescribed meal plan  for next visit.    FOLLOW UP: Return in 3-4 wks. She was informed of the importance of frequent follow up visits to maximize her success with intensive lifestyle modifications for her multiple health conditions.  Subjective:   Chief complaint: Obesity [name] is here to discuss her progress with her obesity treatment plan. She is on the Category 3 Plan and states she is following her eating plan approximately 0% of the time. She states she is not exercising.   Interval History:  [name] is here for a follow up office visit. This is my first time meeting with Natasha [name]; she was previously managed & treated by Dr.Brown. Last saw Dr.Brown on 6/17. Pt endorses not following any meal plan because she has been "sick due to UTIs and pancreatic insufficiency". Reports that one of her biggest challenges is eating all the protein portions on her meal plan. Pt appears motivated to get back on track.   Review of Systems:  Pertinent positives were addressed with patient today.  Reviewed by clinician on day of visit: allergies, medications, problem list, medical history, surgical history, family history, social history, and previous encounter notes.  Weight Summary and Biometrics   Weight Lost Since Last Visit: 0lb  Weight Gained Since Last Visit: 13lb   Vitals Temp: 98.8 F (37.1 C) BP: (!) 146/89 Pulse Rate: 60 SpO2: 95 %   Anthropometric Measurements Height: 5\' 6"  (1.676 m) Weight: (!) 320 lb (145.2 kg) BMI (Calculated): 51.67 Weight at Last Visit: 307lb Weight Lost Since Last Visit: 0lb Weight Gained Since Last Visit: 13lb Starting Weight: 317lb Total Weight Loss (lbs): 0 lb (0 kg)   Body Composition  Body Fat %: 68.6 % Fat Mass (lbs): 219.6 lbs Muscle Mass (lbs): 95.4 lbs Visceral Fat  Rating : 28   Other Clinical Data Fasting: no Labs: no Today's Visit #: 6 Starting Date: 11/26/22   Objective:   PHYSICAL EXAM: Blood pressure (!) 146/89, pulse 60, temperature 98.8 F (37.1 C), height 5\' 6"  (1.676 m), weight (!) 320 lb (145.2 kg), SpO2 95%. Body mass index is 51.65 kg/m.  General: Well Developed, well nourished, and in no acute distress.  HEENT: Normocephalic, atraumatic Skin: Warm and dry, cap RF less 2 sec, good turgor Chest:  Normal excursion, shape, no gross abn Respiratory: speaking in full sentences, no conversational dyspnea NeuroM-Sk: Ambulates w/o assistance, moves * 4 Psych: A and O *3, insight good, mood-full  DIAGNOSTIC DATA REVIEWED:  BMET    Component Value Date/Time   NA 143 05/07/2023 1054   K 4.0 05/07/2023 1054   CL 105 05/07/2023 1054   CO2 26 05/07/2023 1054   GLUCOSE 83 05/07/2023 1054   GLUCOSE 96 07/12/2022 0850   BUN 15 05/07/2023 1054   CREATININE 0.86 05/07/2023 1054   CREATININE 0.74  12/31/2015 1145   CALCIUM 9.5 05/07/2023 1054   GFRNONAA >60 07/12/2022 0850   GFRAA 76 12/16/2019 1037   Lab Results  Component Value Date   HGBA1C 6.0 (H) 11/26/2022   HGBA1C 5.1 03/19/2021   Lab Results  Component Value Date   INSULIN 10.7 11/26/2022   Lab Results  Component Value Date   TSH 0.298 (L) 05/07/2023   CBC    Component Value Date/Time   WBC 6.6 05/07/2023 1054   WBC 7.8 07/12/2022 0850   RBC 4.33 05/07/2023 1054   RBC 4.85 07/12/2022 0850   HGB 12.4 05/07/2023 1054   HCT 38.3 05/07/2023 1054   PLT 273 05/07/2023 1054   MCV 89 05/07/2023 1054   MCH 28.6 05/07/2023 1054   MCH 29.1 07/12/2022 0850   MCHC 32.4 05/07/2023 1054   MCHC 32.3 07/12/2022 0850   RDW 13.9 05/07/2023 1054   Iron Studies    Component Value Date/Time   FERRITIN 178 09/05/2019 0500   Lipid Panel     Component Value Date/Time   CHOL 161 11/26/2022 1249   TRIG 52 11/26/2022 1249   HDL 83 11/26/2022 1249   CHOLHDL 2.1 10/11/2022  1458   CHOLHDL 2.3 09/04/2019 1230   VLDL 18 09/04/2019 1230   LDLCALC 67 11/26/2022 1249   Hepatic Function Panel     Component Value Date/Time   PROT 7.4 11/26/2022 1249   ALBUMIN 4.4 11/26/2022 1249   AST 16 11/26/2022 1249   ALT 16 11/26/2022 1249   ALKPHOS 143 (H) 11/26/2022 1249   BILITOT 0.3 11/26/2022 1249   BILIDIR 0.1 05/23/2008 1721      Component Value Date/Time   TSH 0.298 (L) 05/07/2023 1054   Nutritional Lab Results  Component Value Date   VD25OH 33.5 11/26/2022   VD25OH 30.2 10/11/2022   VD25OH 28.8 (L) 11/30/2021    Attestations:   Reviewed by clinician on day of visit: allergies, medications, problem list, medical history, surgical history, family history, social history, and previous encounter notes pertinent to patient's obesity diagnosis.    This encounter took a total of 40 minutes of time including time coordinating the above treatment plan, any pre-visit chart review and post-visit documentation and reviewing any labs and/or imaging, reviewing pertinent prior notes, and providing counseling the patient about such treatment plan.  Approximately 50% of this time was spent in direct, face-to-face counseling and coordination of care.  I, Special Randolm Idol, acting as a Stage manager for [employer], DO., have compiled all relevant documentation for today's office visit on behalf of Thomasene Lot, DO, while in the presence of [employer], DO.  I have reviewed the above documentation for accuracy and completeness, and I agree with the above. Natasha [name], D.O.  The 21st Century Cures Act was signed into law in 2016 which includes the topic of electronic health records.  This provides immediate access to information in MyChart.  This includes consultation notes, operative notes, office notes, lab results and pathology reports.  If you have any questions about what you read please let us know at your next visit so we can discuss your concerns and take  corrective action if need be.  We are right here with you.

## 2023-06-03 NOTE — Telephone Encounter (Signed)
LVM for patient to return call regarding referral.

## 2023-06-06 NOTE — Assessment & Plan Note (Signed)
Routine use of CPAP with benefit from ongoing use. Continue.

## 2023-06-12 ENCOUNTER — Telehealth: Payer: Self-pay | Admitting: Internal Medicine

## 2023-06-12 NOTE — Telephone Encounter (Signed)
Natasha [name] with [employer] states that pt needs a new sleep study done.   Tried calling patient to see if this has been done already. I will also send an order to [facility] just in case

## 2023-06-17 NOTE — Addendum Note (Signed)
Addended by: Carlye Grippe on: 06/17/2023 09:12 AM   Modules accepted: Level of Service

## 2023-06-18 ENCOUNTER — Ambulatory Visit (HOSPITAL_BASED_OUTPATIENT_CLINIC_OR_DEPARTMENT_OTHER): Payer: Medicare PPO | Admitting: Cardiology

## 2023-06-18 ENCOUNTER — Encounter (HOSPITAL_BASED_OUTPATIENT_CLINIC_OR_DEPARTMENT_OTHER): Payer: Self-pay | Admitting: Cardiology

## 2023-06-18 VITALS — BP 122/72 | HR 54 | Ht 67.0 in | Wt 323.0 lb

## 2023-06-18 DIAGNOSIS — I10 Essential (primary) hypertension: Secondary | ICD-10-CM

## 2023-06-18 DIAGNOSIS — M7989 Other specified soft tissue disorders: Secondary | ICD-10-CM

## 2023-06-18 DIAGNOSIS — R600 Localized edema: Secondary | ICD-10-CM

## 2023-06-18 DIAGNOSIS — R0789 Other chest pain: Secondary | ICD-10-CM

## 2023-06-18 DIAGNOSIS — R0609 Other forms of dyspnea: Secondary | ICD-10-CM | POA: Diagnosis not present

## 2023-06-18 NOTE — Progress Notes (Signed)
Cardiology Office Note:  .   Date:  06/18/2023  ID:  Natasha [name], DOB 1951-01-04, MRN [id number] PCP: Natasha Eth, NP  [facility] Providers Cardiologist:  Natasha Red, MD {  History of Present Illness: Natasha [name] is a 72 y.o. female with a hx of Covid-19 infection in 08/2019, pulmonary embolism, hypertension, hyperthyroidism who is seen for follow up. I initially saw her 11/17/19 as a new consult at the request of Natasha Rakers, MD for the evaluation and management of post-Covid/post-PE management and bradycardia.   History: She required hospitalization for Covid pneumonia from 08/21/19-09/06/19. She received steroids and remdesivir. During her admission, she was diagnosed with submassive PE and started on rivaroxaban. She was recommended for cardiology post discharge follow up for bradycardia and monitoring of RV function.  Today: Went to [place] for [employer], had a great trip. Has a lot of stress at home. Having pain in her upper chest every 2-3 days, not exertional, mild, self limited. Feels like a warming, "yucky" feeling in her upper chest. Started after she started taking zpep for her pancreas. Drinking water helps. Also separately noted intermittent bilateral arm numbness. Feels short of breath when she pushes herself.  Has pending sleep study to help get her an updated CPAP machine.  Notes LE edema worse at the end of the day, improves with sleep/elevation.  Restarted following with healthy weight and wellness.  ROS positive for general fatigue, recurrent UTIs/nocturia.  ROS: Denies shortness of breath at rest. No PND, orthopnea, or unexpected weight gain. No syncope or palpitations. ROS otherwise negative except as noted.   Studies Reviewed: Marland Kitchen    EKG:  EKG Interpretation Date/Time:  Wednesday June 18 2023 10:33:33 EDT Ventricular Rate:  54 PR Interval:  194 QRS Duration:  104 QT Interval:  428 QTC Calculation: 405 R Axis:   -19  Text  Interpretation: Sinus bradycardia Moderate voltage criteria for LVH, may be normal variant Confirmed by Natasha [name] [phone]) on 06/18/2023 10:56:36 AM    Physical Exam:   VS:  BP 122/72 (BP Location: Left Arm, Patient Position: Sitting, Cuff Size: Large)   Pulse (!) 54   Ht 5\' 7"  (1.702 m)   Wt (!) 323 lb (146.5 kg)   SpO2 96%   BMI 50.59 kg/m    Wt Readings from Last 3 Encounters:  06/18/23 (!) 323 lb (146.5 kg)  06/03/23 (!) 320 lb (145.2 kg)  05/14/23 (!) 318 lb (144.2 kg)    GEN: Well nourished, well developed in no acute distress HEENT: Normal, moist mucous membranes NECK: No JVD CARDIAC: regular rhythm, normal S1 and S2, no rubs or gallops. No murmur. VASCULAR: Radial and DP pulses 2+ bilaterally. No carotid bruits RESPIRATORY:  Clear to auscultation without rales, wheezing or rhonchi  ABDOMEN: Soft, non-tender, non-distended MUSCULOSKELETAL:  Ambulates independently with rollator SKIN: Warm and dry, trivial bilateral LE edema NEUROLOGIC:  Alert and oriented x 3. No focal neuro deficits noted. PSYCHIATRIC:  Normal affect    ASSESSMENT AND PLAN: .    Chest pressure Dyspnea on exertion -somewhat atypical, but does have risk factors -discussed options today. Will proceed with cardiac PET -reviewed [name] flag warning signs that need immediate medical attention  Informed Consent   Shared Decision Making/Informed Consent The risks [chest pain, shortness of breath, cardiac arrhythmias, dizziness, blood pressure fluctuations, myocardial infarction, stroke/transient ischemic attack, nausea, vomiting, allergic reaction, radiation exposure, metallic taste sensation and life-threatening complications (estimated to be 1 in 10,000)],  benefits (risk stratification, diagnosing coronary artery disease, treatment guidance) and alternatives of a cardiac PET stress test were discussed in detail with [name] and she agrees to proceed.      LE edema, chronic History of Covid  pneumonia and pulmonary embolism -echo: no evidence of right or left sided heart failure -counseled on need to continue using CPAP for OSA -counseled on daily weights, salt avoidance -counseled on compression stockings and leg elevation. With normal right atrial pressure, suspect component of chronic venous insufficiency -remains on rivaroxaban, likely lifelong   Hypertension: -continue daily furosemide -continued amlodipine. Do not suspect that 5 mg dose is contributing significantly to LE edema -continue valsartan   Morbid obesity, BMI 50 -we have discussed GLP1RA. She does have a history of papillary thyroid cancer (not medullary). -now following with healthy weight and wellness  CV risk counseling and prevention -recommend heart healthy/Mediterranean diet, with whole grains, fruits, vegetable, fish, lean meats, nuts, and olive oil. Limit salt. -recommend moderate walking, 3-5 times/week for 30-50 minutes each session. Aim for at least 150 minutes.week. Goal should be pace of 3 miles/hours, or walking 1.5 miles in 30 minutes -recommend avoidance of tobacco products. Avoid excess alcohol. -ASCVD risk score: The 10-year ASCVD risk score (Arnett DK, et al., 2019) is: 24.3%   Values used to calculate the score:     Age: 82 years     Sex: Female     Is Non-Hispanic African American: Yes     Diabetic: Yes     Tobacco smoker: No     Systolic Blood Pressure: 122 mmHg     Is BP treated: Yes     HDL Cholesterol: 83 mg/dL     Total Cholesterol: 161 mg/dL    Dispo: 3 mos or sooner based on test results  Signed, Natasha Red, MD   Natasha Red, MD, PhD, [facility]  [facility]  [place]  Heart & Vascular at [facility] at [facility] [address] [place], [place] [address] [phone]

## 2023-06-18 NOTE — Patient Instructions (Addendum)
Medication Instructions:  NO CHANGES *If you need a refill on your cardiac medications before your next appointment, please call your pharmacy*   Lab Work: NONE If you have labs (blood work) drawn today and your tests are completely normal, you will receive your results only by: MyChart Message (if you have MyChart) OR A paper copy in the mail If you have any lab test that is abnormal or we need to change your treatment, we will call you to review the results.   Testing/Procedures:Follow-Up:How to Prepare for Your Cardiac PET/CT Stress Test:  1. Please do not take these medications before your test:   Medications that may interfere with the cardiac pharmacological stress agent (ex. nitrates - including erectile dysfunction medications, isosorbide mononitrate, tamulosin or beta-blockers) the day of the exam.Theophylline containing medications for 12 hours. Dipyridamole 48 hours prior to the test. Your remaining medications may be taken with water.  2. Nothing to eat or drink, except water, 3 hours prior to arrival time.   NO caffeine/decaffeinated products, or chocolate 12 hours prior to arrival.  3. NO perfume, cologne or lotion on chest or abdomen area.          - FEMALES - Please avoid wearing dresses to this appointment.  4. Total time is 1 to 2 hours; you may want to bring reading material for the waiting time.  5. Please report to Radiology at the [facility] Main Entrance 30 minutes early for your test.  [address] [place], [place] [address]  6. Please report to Radiology at [facility] Main Entrance, medical mall, 30 mins prior to your test.  [address]  [place], [place]  [phone]  Diabetic Preparation:  Hold oral medications. You may take NPH and Lantus insulin. Do not take Humalog or Humulin R (Regular Insulin) the day of your test. Check blood sugars prior to leaving the house. If able to eat breakfast prior to 3  hour fasting, you may take all medications, including your insulin, Do not worry if you miss your breakfast dose of insulin - start at your next meal. Patients who wear a continuous glucose monitor MUST remove the device prior to scanning.  In preparation for your appointment, medication and supplies will be purchased.  Appointment availability is limited, so if you need to cancel or reschedule, please call the Radiology Department at [phone] [facility]) OR [phone] [facility]  24 hours in advance to avoid a cancellation fee of $100.00  What to Expect After you Arrive:  Once you arrive and check in for your appointment, you will be taken to a preparation room within the Radiology Department.  A technologist or Nurse will obtain your medical history, verify that you are correctly prepped for the exam, and explain the procedure.  Afterwards,  an IV will be started in your arm and electrodes will be placed on your skin for EKG monitoring during the stress portion of the exam. Then you will be escorted to the PET/CT scanner.  There, staff will get you positioned on the scanner and obtain a blood pressure and EKG.  During the exam, you will continue to be connected to the EKG and blood pressure machines.  A small, safe amount of a radioactive tracer will be injected in your IV to obtain a series of pictures of your heart along with an injection of a stress agent.    After your Exam:  It is recommended that you eat a meal and  drink a caffeinated beverage to counter act any effects of the stress agent.  Drink plenty of fluids for the remainder of the day and urinate frequently for the first couple of hours after the exam.  Your doctor will inform you of your test results within 7-10 business days.  For more information and frequently asked questions, please visit our website : [email or web address]  For questions about your test or how to prepare for your test, please call: Cardiac  Imaging Nurse Navigators Office: [phone]  At [facility], you and your health needs are our priority.  As part of our continuing mission to provide you with exceptional heart care, we have created designated Provider Care Teams.  These Care Teams include your primary Cardiologist (physician) and Advanced Practice Providers (APPs -  Physician Assistants and Nurse Practitioners) who all work together to provide you with the care you need, when you need it.  We recommend signing up for the patient portal called "MyChart".  Sign up information is provided on this After Visit Summary.  MyChart is used to connect with patients for Virtual Visits (Telemedicine).  Patients are able to view lab/test results, encounter notes, upcoming appointments, etc.  Non-urgent messages can be sent to your provider as well.   To learn more about what you can do with MyChart, go to [email or web address].    Your next appointment:   3 month(s)  Provider:   Jodelle Red, MD    Other Instructions NONE

## 2023-07-01 ENCOUNTER — Ambulatory Visit (INDEPENDENT_AMBULATORY_CARE_PROVIDER_SITE_OTHER): Payer: Medicare PPO | Admitting: Family Medicine

## 2023-07-18 NOTE — Telephone Encounter (Signed)
Spoke with [employer] and they advised me that they are still waiting on patient to call to confirm insurance and consent to send out test.  Called and spoke with patient and she was given number to call to speak with someone at [employer]. She will do this so they can mail her the test

## 2023-07-27 ENCOUNTER — Encounter (HOSPITAL_COMMUNITY): Payer: Self-pay

## 2023-07-28 ENCOUNTER — Other Ambulatory Visit: Payer: Self-pay | Admitting: Cardiovascular Disease

## 2023-07-28 ENCOUNTER — Telehealth (HOSPITAL_COMMUNITY): Payer: Self-pay | Admitting: *Deleted

## 2023-07-28 DIAGNOSIS — R079 Chest pain, unspecified: Secondary | ICD-10-CM

## 2023-07-28 NOTE — Telephone Encounter (Signed)
Attempted to call patient regarding upcoming cardiac PET appointment. Left message on voicemail with name and callback number  Larey Brick RN Navigator Cardiac Imaging [facility] [phone] Office [phone] Cell  Reminder to avoid caffeine 12 hours prior to her cardiac PET scan.

## 2023-07-28 NOTE — Progress Notes (Signed)
PET consent placed for Dr. Cristal Deer.   Gerri Spore T. Flora Lipps, MD, [facility]  [facility]  [address], [address] [place], [place] [address] [phone]  9:59 AM

## 2023-07-29 ENCOUNTER — Ambulatory Visit (HOSPITAL_COMMUNITY): Admission: RE | Admit: 2023-07-29 | Payer: Medicare PPO | Source: Ambulatory Visit

## 2023-08-03 ENCOUNTER — Encounter (HOSPITAL_BASED_OUTPATIENT_CLINIC_OR_DEPARTMENT_OTHER): Payer: Self-pay | Admitting: Cardiology

## 2023-08-11 DIAGNOSIS — G4730 Sleep apnea, unspecified: Secondary | ICD-10-CM | POA: Diagnosis not present

## 2023-08-20 DIAGNOSIS — F4323 Adjustment disorder with mixed anxiety and depressed mood: Secondary | ICD-10-CM | POA: Diagnosis not present

## 2023-08-21 DIAGNOSIS — N3946 Mixed incontinence: Secondary | ICD-10-CM | POA: Diagnosis not present

## 2023-08-21 DIAGNOSIS — N3000 Acute cystitis without hematuria: Secondary | ICD-10-CM | POA: Diagnosis not present

## 2023-09-08 ENCOUNTER — Ambulatory Visit: Payer: Medicare PPO | Admitting: Nurse Practitioner

## 2023-09-09 ENCOUNTER — Telehealth (HOSPITAL_BASED_OUTPATIENT_CLINIC_OR_DEPARTMENT_OTHER): Payer: Self-pay | Admitting: Cardiology

## 2023-09-09 ENCOUNTER — Other Ambulatory Visit (HOSPITAL_BASED_OUTPATIENT_CLINIC_OR_DEPARTMENT_OTHER): Payer: Self-pay

## 2023-09-09 DIAGNOSIS — Z86711 Personal history of pulmonary embolism: Secondary | ICD-10-CM

## 2023-09-09 DIAGNOSIS — E039 Hypothyroidism, unspecified: Secondary | ICD-10-CM

## 2023-09-09 MED ORDER — RIVAROXABAN 20 MG PO TABS
20.0000 mg | ORAL_TABLET | Freq: Every day | ORAL | 1 refills | Status: DC
Start: 2023-09-09 — End: 2023-09-09
  Filled 2023-09-09: qty 90, 90d supply, fill #0

## 2023-09-09 MED ORDER — RIVAROXABAN 20 MG PO TABS
20.0000 mg | ORAL_TABLET | Freq: Every day | ORAL | 1 refills | Status: DC
Start: 2023-09-09 — End: 2024-01-09

## 2023-09-09 MED ORDER — LEVOTHYROXINE SODIUM 125 MCG PO TABS
125.0000 ug | ORAL_TABLET | Freq: Every day | ORAL | 1 refills | Status: DC
  Filled 2023-09-09: qty 90, 90d supply, fill #0

## 2023-09-09 MED ORDER — LEVOTHYROXINE SODIUM 125 MCG PO TABS
125.0000 ug | ORAL_TABLET | Freq: Every day | ORAL | 1 refills | Status: DC
Start: 2023-09-09 — End: 2024-07-07

## 2023-09-09 NOTE — Telephone Encounter (Signed)
*  STAT* If patient is at the pharmacy, call can be transferred to refill team.   1. Which medications need to be refilled? (please list name of each medication and dose if known)   levothyroxine  (SYNTHROID ) 125 MCG tablet  rivaroxaban  (XARELTO ) 20 MG TABS tablet    2. Would you like to learn more about the convenience, safety, & potential cost savings by using the [facility]?   3. Are you open to using the [facility] (Type [facility]. ).  4. Which pharmacy/location (including street and city if local pharmacy) is medication to be sent to?  [id number] - [place], [place] - [address]   5. Do they need a 30 day or 90 day supply?  30 day  Patient stated she is completely out of these medications.

## 2023-09-10 ENCOUNTER — Ambulatory Visit (INDEPENDENT_AMBULATORY_CARE_PROVIDER_SITE_OTHER): Payer: Medicare PPO | Admitting: Medical

## 2023-09-10 VITALS — BP 124/84 | HR 61 | Temp 98.3°F | Ht 67.0 in | Wt 315.0 lb

## 2023-09-10 DIAGNOSIS — Z7409 Other reduced mobility: Secondary | ICD-10-CM | POA: Diagnosis not present

## 2023-09-10 DIAGNOSIS — R300 Dysuria: Secondary | ICD-10-CM | POA: Diagnosis not present

## 2023-09-10 DIAGNOSIS — R7303 Prediabetes: Secondary | ICD-10-CM

## 2023-09-10 DIAGNOSIS — I10 Essential (primary) hypertension: Secondary | ICD-10-CM

## 2023-09-10 DIAGNOSIS — I890 Lymphedema, not elsewhere classified: Secondary | ICD-10-CM

## 2023-09-10 DIAGNOSIS — E039 Hypothyroidism, unspecified: Secondary | ICD-10-CM

## 2023-09-10 DIAGNOSIS — I872 Venous insufficiency (chronic) (peripheral): Secondary | ICD-10-CM

## 2023-09-10 DIAGNOSIS — R29898 Other symptoms and signs involving the musculoskeletal system: Secondary | ICD-10-CM | POA: Diagnosis not present

## 2023-09-10 DIAGNOSIS — G4733 Obstructive sleep apnea (adult) (pediatric): Secondary | ICD-10-CM

## 2023-09-10 DIAGNOSIS — R600 Localized edema: Secondary | ICD-10-CM

## 2023-09-10 DIAGNOSIS — R2681 Unsteadiness on feet: Secondary | ICD-10-CM

## 2023-09-10 LAB — POCT URINALYSIS DIP (PROADVANTAGE DEVICE)
Bilirubin, UA: NEGATIVE
Blood, UA: NEGATIVE
Glucose, UA: NEGATIVE mg/dL
Nitrite, UA: NEGATIVE
Protein Ur, POC: NEGATIVE mg/dL
Specific Gravity, Urine: 1.02
Urobilinogen, Ur: NEGATIVE
pH, UA: 6 (ref 5.0–8.0)

## 2023-09-10 NOTE — Progress Notes (Signed)
 Subjective:  Natasha [name] is a 73 y.o. female who presents for Chief Complaint  Patient presents with   Leg Swelling    Legs swelling, and feet swelling and feels like she has cracked feet. Follow-up on UTI. Burning with urination, bad odor, saw urology 2 weeks ago for antibitoic. Since being finished with medication, her symptoms have returned.  Having a lot of chills and cold all the time x 2 weeks but no fever. Would like to see if she can get on wegovy to help with weight loss     She has several concerns  She continues to deal with leg swelling regularly.  She has compression devices at home but only uses about twice a week.  She urinates that much with Lasix  and only uses the Lasix  on average twice a week.  No worse salt intake of late.  She feels that she needs prescription version compression hose as daily, ones will not fit  She has trouble walking and instability on her feet.  She uses a walker but still sometimes feels at risk of falling.  She does drive but feels like she needs to go back and do some more therapy to help with strengthening  At 1 point she was going to the gym to [facility] but right now feels unstable on her feet to do this.  She would like Wegovy or some medicine to help with weight loss  She like to recheck her urine.  She has been seeing urology for recurrent urinary tract infection issues.  She just finished an antibiotic recently.  No specific symptoms today.  A few years ago she was on antibiotics every day for years for prevention.  She was gradually weaned off of that.  She has not heard back from her recent sleep study.  She has used a CPAP in the past.  Her prior CPAP was recalled  She lives with her daughter and granddaughter.  But she still drives and relies on herself to get to places.  Hypertension-compliant with her medications  She sees cardiology on January 22 for a visit and has a stress test next  week  No other aggravating or relieving factors.    No other c/o.  Past Medical History:  Diagnosis Date   Abdominal spasms 12/14/2020   Acute pulmonary embolism (HCC) 08/21/2019   Bacterial conjunctivitis of both eyes 12/14/2020   Bilateral leg edema 02/10/2020   Bradycardia    Breast cancer screening by mammogram 11/24/2020   CARCINOMA, THYROID  GLAND, PAPILLARY 05/04/2008   Stage 2, 8/09: thyroidectomy for 2.7cm papillary adenocarcinoma (t2 n0 mo) 9/09: I-131 rx, [facility] 05/10: tg is neg (ab neg) , total body scan is neg   Colon cancer screening 11/24/2020   Colon polyps    Complication of anesthesia    trouble with airway   Cough 12/25/2007   Qualifier: Diagnosis of  By: Germaine LPN, Megan     [id number]-80 08/19/2019   Diverticulosis    DVT (deep venous thrombosis) (HCC)    Edema 12/22/2008   Encounter to establish care 11/24/2020   History of 2019 novel coronavirus disease (COVID-19) 11/09/2019   HYPERTENSION 12/25/2007   HYPOTHYROIDISM, POSTSURGICAL 05/04/2008   LEG PAIN, LEFT 12/09/2008   Lumbago with sciatica, right side 01/08/2021   Memory loss due to medical condition 11/09/2019   Muscle weakness    Nausea and vomiting 05/23/2008   Qualifier: Diagnosis of  By: Kassie MD, Alyce LABOR  Formatting of this note might be different from the original. Qualifier: Diagnosis of  By: Kassie MD, Alyce A   Numbness and tingling    OSA (obstructive sleep apnea) 06/15/2013   CPAP   Paresthesia 01/04/2020   Peripheral edema 12/22/2008   Qualifier: Diagnosis of  By: Delford, MD, CODY Maude Dunnings    Pneumonia due to COVID-19 virus    Pulmonary embolism (HCC)    Pulmonary embolism (HCC) 08/23/2019   Right lower quadrant pain 11/24/2020   Sciatica of left side 12/04/2011   Skin sensation disturbance 12/05/2008   Qualifier: Diagnosis of  By: Inocencio MD, Berwyn DELENA Deal of this note might be different from the original. Qualifier: Diagnosis of  By: Inocencio MD, Berwyn A   Spasm of muscle of lower back  12/14/2020   Vaginal candidiasis 03/16/2021   Weakness 01/04/2020   Current Outpatient Medications on File Prior to Visit  Medication Sig Dispense Refill   acetaminophen  (TYLENOL ) 500 MG tablet Take 1 tablet (500 mg total) by mouth every 6 (six) hours as needed. 30 tablet 0   amLODipine  (NORVASC ) 5 MG tablet Take 1 tablet (5 mg total) by mouth daily. 90 tablet 0   estradiol (ESTRACE) 0.1 MG/GM vaginal cream PLEASE SEE ATTACHED FOR DETAILED DIRECTIONS     furosemide  (LASIX ) 20 MG tablet Take 1 tablet (20 mg total) by mouth 2 (two) times daily. 180 tablet 0   levothyroxine  (SYNTHROID ) 125 MCG tablet Take 1 tablet (125 mcg total) by mouth daily. 90 tablet 1   montelukast  (SINGULAIR ) 10 MG tablet Take 1 tablet (10 mg total) by mouth at bedtime. 90 tablet 3   Multiple Vitamin (MULTIVITAMIN WITH MINERALS) TABS tablet Take 1 tablet by mouth daily.     pantoprazole  (PROTONIX ) 40 MG tablet Take 1 tablet (40 mg total) by mouth daily. 90 tablet 3   rivaroxaban  (XARELTO ) 20 MG TABS tablet Take 1 tablet (20 mg total) by mouth daily with supper. 90 tablet 1   valsartan  (DIOVAN ) 160 MG tablet Take 1 tablet (160 mg total) by mouth daily. 90 tablet 0   Vibegron  75 MG TABS Take by mouth.     Vitamin D , Ergocalciferol , (DRISDOL ) 1.25 MG (50000 UNIT) CAPS capsule Take 1 capsule (50,000 Units total) by mouth every 7 (seven) days. 5 capsule 0   nystatin  cream (MYCOSTATIN ) Apply 1 Application topically 2 (two) times daily. (Patient not taking: Reported on 09/10/2023) 30 g 0   potassium chloride  (KLOR-CON ) 10 MEQ tablet Take 1 tablet (10 mEq total) by mouth 2 (two) times daily. (Patient not taking: Reported on 09/10/2023) 60 tablet 0   ZENPEP 40000-126000 units CPEP Take 2 capsules by mouth 3 (three) times daily. (Patient not taking: Reported on 09/10/2023)     No current facility-administered medications on file prior to visit.    The following portions of the patient's history were reviewed and updated as appropriate:  allergies, current medications, past family history, past medical history, past social history, past surgical history and problem list.  ROS Otherwise as in subjective above    Objective: BP 124/84   Pulse 61   Temp 98.3 F (36.8 C)   Ht 5' 7 (1.702 m)   Wt (!) 315 lb (142.9 kg)   BMI 49.34 kg/m   General appearance: alert, no distress, well developed, well nourished seated in wheelchair Neck: supple, no lymphadenopathy, no thyromegaly, no masses, no JVD Heart: RRR, normal S1, S2, no murmurs Lungs: CTA bilaterally, no wheezes, rhonchi, or  rales Lymphedema throughout lower legs, 1+ pedal pulses  Chronic venous insufficiency and stasis of legs She had difficulty walking today and the nurse had to help her in with a wheelchair     Assessment: Encounter Diagnoses  Name Primary?   Lymphedema Yes   Hypothyroidism, unspecified type    Essential hypertension    Chronic venous insufficiency    Leg edema    Burning with urination    Mobility impaired    OSA (obstructive sleep apnea)    Weakness of both lower extremities    Gait instability      Plan: Lymphedema We discussed that this is a chronic condition with no cure Advise she use her pneumatic compressive leg devices 1 to 2 hours every day if possible Needs to be more compliant with diuretic I will reach out to her vascular surgery office about compression hose.  Not sure that this will work well for her given mobility limitations and how difficult compression hose are to put on Limit and avoid salt  Hypothyroidism-continue medication daily first in the morning to stomach 45 minutes before medicines or meal.  Updated labs today  Hypertension-continue amlodipine  5 mg daily, continue valsartan  160 mg daily.  She sees cardiology soon for cardiac testing and recheck, this month  Burning with urination, recent UTI-urinalysis mostly unremarkable today.  Follow-up with urology for chronic urinary issues, recurrent UTI  symptoms  Mobility impairment, weakness of legs and gait instability-continue walker, referral back to physical therapy  Obesity, exercise intolerance, prediabetes Once she completes some physical therapy for impaired mobility and weakness of legs above, I advise she get back in the water aerobics or consider cardiac rehab She had questions about weight loss medication. We discussed the possibility of GLP-1 medication given her risk for diabetes/prediabetic state  Sleep apnea-recent sleep study shows mild sleep apnea.  Discussed possibly using CPAP again.  She has used one before.  She wants to talk with cardiology first before pursuing this   Taunya was seen today for leg swelling.  Diagnoses and all orders for this visit:  Lymphedema  Hypothyroidism, unspecified type -     TSH + free T4  Essential hypertension -     Basic metabolic panel  Chronic venous insufficiency -     Basic metabolic panel  Leg edema -     Basic metabolic panel  Burning with urination -     POCT Urinalysis DIP (Proadvantage Device)  Mobility impaired  OSA (obstructive sleep apnea)  Weakness of both lower extremities  Gait instability  Spent > 45 minutes face to face with patient in discussion of symptoms, evaluation, plan and recommendations.    Follow up: pending labs, referrals

## 2023-09-11 LAB — BASIC METABOLIC PANEL
BUN/Creatinine Ratio: 16 (ref 12–28)
BUN: 13 mg/dL (ref 8–27)
CO2: 22 mmol/L (ref 20–29)
Calcium: 9.1 mg/dL (ref 8.7–10.3)
Chloride: 107 mmol/L — ABNORMAL HIGH (ref 96–106)
Creatinine, Ser: 0.8 mg/dL (ref 0.57–1.00)
Glucose: 82 mg/dL (ref 70–99)
Potassium: 4.3 mmol/L (ref 3.5–5.2)
Sodium: 143 mmol/L (ref 134–144)
eGFR: 78 mL/min/{1.73_m2} (ref >59)

## 2023-09-11 LAB — TSH+FREE T4
Free T4: 1.51 ng/dL (ref 0.82–1.77)
TSH: 1.59 u[IU]/mL (ref 0.450–4.500)

## 2023-09-11 NOTE — Addendum Note (Signed)
 Addended by: Herminio Commons A on: 09/11/2023 02:26 PM   Modules accepted: Orders

## 2023-09-11 NOTE — Progress Notes (Signed)
 Natasha [name]-see my notes from yesterday as there were multiple steps and requests  Results sent through MyChart

## 2023-09-11 NOTE — Progress Notes (Signed)
 I have order PT to breakthrough PT

## 2023-09-16 ENCOUNTER — Telehealth (HOSPITAL_COMMUNITY): Payer: Self-pay | Admitting: *Deleted

## 2023-09-16 NOTE — Telephone Encounter (Signed)
Attempted to call patient regarding upcoming cardiac PET appointment. Left message on voicemail with name and callback number  Larey Brick RN Navigator Cardiac Imaging [facility] [phone] Office [phone] Cell  Reminder to avoid caffeine 12 hours prior to her cardiac PET appt.

## 2023-09-17 ENCOUNTER — Ambulatory Visit (HOSPITAL_COMMUNITY)
Admission: RE | Admit: 2023-09-17 | Discharge: 2023-09-17 | Disposition: A | Discharge status: Home / Self Care | Payer: Medicare PPO | Source: Ambulatory Visit | Attending: Cardiology | Admitting: Cardiology

## 2023-09-17 DIAGNOSIS — R0609 Other forms of dyspnea: Secondary | ICD-10-CM | POA: Insufficient documentation

## 2023-09-17 LAB — NM PET CT CARDIAC PERFUSION MULTI W/ABSOLUTE BLOODFLOW
LV dias vol: 118 mL (ref 46–106)
MBFR: 1.89
Nuc Rest EF: 52 %
Nuc Stress EF: 66 %
Peak HR: 68 {beats}/min
Rest HR: 60 {beats}/min
Rest MBF: 0.96 ml/g/min
Rest Nuclear Isotope Dose: 29.9 mCi
Rest perfusion cavity size (mL): 118 mL
ST Depression (mm): 0 mm
Stress MBF: 1.81 ml/g/min
Stress Nuclear Isotope Dose: 30 mCi
Stress perfusion cavity size (mL): 128 mL
TID: 0.97

## 2023-09-17 MED ORDER — RUBIDIUM RB82 GENERATOR (RUBYFILL)
30.0000 | PACK | Freq: Once | INTRAVENOUS | Status: AC
  Administered 2023-09-17: 29.9 via INTRAVENOUS

## 2023-09-17 MED ORDER — REGADENOSON 0.4 MG/5ML IV SOLN
INTRAVENOUS | Status: AC
  Filled 2023-09-17: qty 5

## 2023-09-17 MED ORDER — REGADENOSON 0.4 MG/5ML IV SOLN
0.4000 mg | Freq: Once | INTRAVENOUS | Status: AC
Start: 2023-09-17 — End: 2023-09-17
  Administered 2023-09-17: 0.4 mg via INTRAVENOUS

## 2023-09-17 MED ORDER — RUBIDIUM RB82 GENERATOR (RUBYFILL)
30.0000 | PACK | Freq: Once | INTRAVENOUS | Status: AC
  Administered 2023-09-17: 30 via INTRAVENOUS

## 2023-09-19 ENCOUNTER — Other Ambulatory Visit: Payer: Self-pay | Admitting: Medical

## 2023-09-19 NOTE — Progress Notes (Signed)
Called pt, no answer, left voicemail.

## 2023-09-22 ENCOUNTER — Encounter (HOSPITAL_BASED_OUTPATIENT_CLINIC_OR_DEPARTMENT_OTHER): Payer: Self-pay

## 2023-09-23 ENCOUNTER — Encounter (HOSPITAL_BASED_OUTPATIENT_CLINIC_OR_DEPARTMENT_OTHER): Payer: Self-pay | Admitting: Cardiology

## 2023-09-24 ENCOUNTER — Ambulatory Visit (HOSPITAL_BASED_OUTPATIENT_CLINIC_OR_DEPARTMENT_OTHER): Payer: Medicare PPO | Admitting: Cardiology

## 2023-09-24 NOTE — Progress Notes (Signed)
Called pt, no answer, left voicemail.

## 2023-10-16 ENCOUNTER — Telehealth (INDEPENDENT_AMBULATORY_CARE_PROVIDER_SITE_OTHER): Payer: Medicare PPO | Admitting: Medical

## 2023-10-16 VITALS — Wt 310.0 lb

## 2023-10-16 DIAGNOSIS — R7303 Prediabetes: Secondary | ICD-10-CM | POA: Diagnosis not present

## 2023-10-16 DIAGNOSIS — M25559 Pain in unspecified hip: Secondary | ICD-10-CM | POA: Diagnosis not present

## 2023-10-16 DIAGNOSIS — G4733 Obstructive sleep apnea (adult) (pediatric): Secondary | ICD-10-CM | POA: Diagnosis not present

## 2023-10-16 DIAGNOSIS — I50810 Right heart failure, unspecified: Secondary | ICD-10-CM | POA: Diagnosis not present

## 2023-10-16 DIAGNOSIS — M1710 Unilateral primary osteoarthritis, unspecified knee: Secondary | ICD-10-CM | POA: Diagnosis not present

## 2023-10-16 DIAGNOSIS — Z9989 Dependence on other enabling machines and devices: Secondary | ICD-10-CM

## 2023-10-16 DIAGNOSIS — I890 Lymphedema, not elsewhere classified: Secondary | ICD-10-CM

## 2023-10-16 DIAGNOSIS — Z6841 Body Mass Index (BMI) 40.0 and over, adult: Secondary | ICD-10-CM

## 2023-10-16 DIAGNOSIS — E890 Postprocedural hypothyroidism: Secondary | ICD-10-CM | POA: Diagnosis not present

## 2023-10-16 DIAGNOSIS — R29898 Other symptoms and signs involving the musculoskeletal system: Secondary | ICD-10-CM | POA: Diagnosis not present

## 2023-10-16 DIAGNOSIS — Z7409 Other reduced mobility: Secondary | ICD-10-CM

## 2023-10-16 MED ORDER — OZEMPIC (0.25 OR 0.5 MG/DOSE) 2 MG/1.5ML ~~LOC~~ SOPN: 0.2500 mg | PEN_INJECTOR | SUBCUTANEOUS | 0 refills | Status: DC

## 2023-10-16 MED ORDER — OZEMPIC (0.25 OR 0.5 MG/DOSE) 2 MG/3ML ~~LOC~~ SOPN: 0.5000 mg | PEN_INJECTOR | SUBCUTANEOUS | 0 refills | Status: DC

## 2023-10-16 NOTE — Progress Notes (Signed)
Subjective:     Patient ID: Natasha [name], female   DOB: Jan 30, 1951, 73 y.o.   MRN: [id number]  This visit type was conducted due to national recommendations for restrictions regarding the COVID-19 Pandemic (e.g. social distancing) in an effort to limit this patient's exposure and mitigate transmission in our community.  Due to their co-morbid illnesses, this patient is at least at moderate risk for complications without adequate follow up.  This format is felt to be most appropriate for this patient at this time.    Documentation for virtual audio and video telecommunications through [facility] encounter:  The patient was located at home. The provider was located in the office. The patient did consent to this visit and is aware of possible charges through their insurance for this visit.  The other persons participating in this telemedicine service were none. Time spent on call was 20 minutes and in review of previous records 20 minutes total.  This virtual service is not related to other E/M service within previous 7 days.   HPI Chief Complaint  Patient presents with   swelling in legs    Swelling in legs and knee, very painful been going on for a couple month   Virtual consult for leg issues.    Having a lot of pains in both knees, cloicking and making noise, soles of feet hurt.  Has history of bone-on-bone arthritis in both knees.  Has seen Ortho in the past but they advised they would not operate unless she lost 50 pounds.  However she has trouble with exercise due to risk of falling, and her pain limits exercise.  [facility] but now this got really cold and has been a lot of rain and weather outside she is scared to drive over there.  She has ongoing concerns about her leg swelling.  She knows she needs to do better about using her leg compression devices.  She is using her Lasix.  She would like something to use to help with  pain both more natural and prescription potentially.  She really would like some help with losing weight.  She struggles with her weight and would like to get weight off in the event she ever needed pursue knee surgery.  Her last visit with orthopedics was 3 years ago.  No other aggravating or relieving factors. No other complaint.     Past Medical History:  Diagnosis Date   Abdominal spasms 12/14/2020   Acute pulmonary embolism (HCC) 08/21/2019   Bacterial conjunctivitis of both eyes 12/14/2020   Bilateral leg edema 02/10/2020   Bradycardia    Breast cancer screening by mammogram 11/24/2020   CARCINOMA, THYROID GLAND, PAPILLARY 05/04/2008   Stage 2, 8/09: thyroidectomy for 2.7cm papillary adenocarcinoma (t2 n0 mo) 9/09: I-131 rx, 108 [facility] 05/10: tg is neg (ab neg) , total body scan is neg   Colon cancer screening 11/24/2020   Colon polyps    Complication of anesthesia    trouble with airway   Cough 12/25/2007   Qualifier: Diagnosis of  By: Thad Ranger LPN, Megan     [id number] 08/19/2019   Diverticulosis    DVT (deep venous thrombosis) (HCC)    Edema 12/22/2008   Encounter to establish care 11/24/2020   History of 2019 novel coronavirus disease (COVID-19) 11/09/2019   HYPERTENSION 12/25/2007   HYPOTHYROIDISM, POSTSURGICAL 05/04/2008   LEG PAIN, LEFT 12/09/2008   Lumbago with sciatica, right side 01/08/2021   Memory  loss due to medical condition 11/09/2019   Muscle weakness    Nausea and vomiting 05/23/2008   Qualifier: Diagnosis of  By: Everardo All MD, Clayborn Heron of this note might be different from the original. Qualifier: Diagnosis of  By: Everardo All MD, Gregary Signs A   Numbness and tingling    OSA (obstructive sleep apnea) 06/15/2013   CPAP   Paresthesia 01/04/2020   Peripheral edema 12/22/2008   Qualifier: Diagnosis of  By: Eden Emms, MD, Harrington Challenger    Pneumonia due to COVID-19 virus    Pulmonary embolism [facility])    Pulmonary embolism (HCC) 08/23/2019   Right lower quadrant pain 11/24/2020    Sciatica of left side 12/04/2011   Skin sensation disturbance 12/05/2008   Qualifier: Diagnosis of  By: Felicity Coyer MD, Vista Mink of this note might be different from the original. Qualifier: Diagnosis of  By: Felicity Coyer MD, Vikki Ports A   Spasm of muscle of lower back 12/14/2020   Vaginal candidiasis 03/16/2021   Weakness 01/04/2020   Current Outpatient Medications on File Prior to Visit  Medication Sig Dispense Refill   amLODipine (NORVASC) 5 MG tablet Take 1 tablet (5 mg total) by mouth daily. 90 tablet 0   estradiol (ESTRACE) 0.1 MG/GM vaginal cream PLEASE SEE ATTACHED FOR DETAILED DIRECTIONS     furosemide (LASIX) 20 MG tablet Take 1 tablet (20 mg total) by mouth 2 (two) times daily. 180 tablet 0   levothyroxine (SYNTHROID) 125 MCG tablet Take 1 tablet (125 mcg total) by mouth daily. 90 tablet 1   montelukast (SINGULAIR) 10 MG tablet Take 1 tablet (10 mg total) by mouth at bedtime. 90 tablet 3   Multiple Vitamin (MULTIVITAMIN WITH MINERALS) TABS tablet Take 1 tablet by mouth daily.     nystatin cream (MYCOSTATIN) Apply 1 Application topically 2 (two) times daily. 30 g 0   pantoprazole (PROTONIX) 40 MG tablet Take 1 tablet (40 mg total) by mouth daily. 90 tablet 3   potassium chloride (KLOR-CON) 10 MEQ tablet Take 1 tablet (10 mEq total) by mouth 2 (two) times daily. 60 tablet 0   rivaroxaban (XARELTO) 20 MG TABS tablet Take 1 tablet (20 mg total) by mouth daily with supper. 90 tablet 1   valsartan (DIOVAN) 160 MG tablet Take 1 tablet (160 mg total) by mouth daily. 90 tablet 0   Vitamin D, Ergocalciferol, (DRISDOL) 1.25 MG (50000 UNIT) CAPS capsule Take 1 capsule (50,000 Units total) by mouth every 7 (seven) days. 5 capsule 0   ZENPEP 40000-126000 units CPEP Take 2 capsules by mouth 3 (three) times daily.     acetaminophen (TYLENOL) 500 MG tablet Take 1 tablet (500 mg total) by mouth every 6 (six) hours as needed. 30 tablet 0   Vibegron 75 MG TABS Take by mouth.     No current  facility-administered medications on file prior to visit.    Review of Systems As in subjective    Objective:   Physical Exam Due to coronavirus pandemic stay at home measures, patient visit was virtual and they were not examined in person.   Wt (!) 310 lb (140.6 kg)   BMI 48.55 kg/m   Wt Readings from Last 3 Encounters:  10/16/23 (!) 310 lb (140.6 kg)  09/10/23 (!) 315 lb (142.9 kg)  06/18/23 (!) 323 lb (146.5 kg)   General: Well-developed well-nourished no acute distress     Assessment:     Encounter Diagnoses  Name Primary?   Arthritis of  knee Yes   Mobility impaired    Lymphedema    Hypothyroidism, postsurgical    Hip pain, unspecified laterality    OSA (obstructive sleep apnea)    Right-sided heart failure, unspecified HF chronicity (HCC)    Prediabetes    Weakness of both lower extremities    Walker as ambulation aid    BMI 45.0-49.9, adult (HCC)        Plan:     Arthritis of knees, impaired mobility hip pain, arthritis in general of hands Consider follow-up with orthopedics Advise she find a way to get back in water aerobics regularly. Can use Tylenol over-the-counter 2-3 times a day as needed We discussed natural anti-inflammatories, continue turmeric as she is doing Consider topical creams such as Aspercreme or capsaicin cream Consider lidocaine patch over-the-counter Consider oral medications prescription but she declines those for now including Tylenol 3 or tramadol   Hypothyroidism-continue current medication  Lymphedema-advise she use her compression pneumatic devices, continue Lasix as she is doing routinely, try to use compression hose  Given her BMI greater than 40, prediabetes, prior heart issues, thyroid issues, limitations with exercise, I strongly feel she could benefit from a GLP-1 type medication to help with weight loss.  She is agreeable to that type of treatment.  I will see if we can get Ozempic covered to help with her prediabetic  state, high risk of developing diabetes, and to overall reduce her weight and burden on her joints  She is a walker for ambulation aid   Zorah was seen today for swelling in legs.  Diagnoses and all orders for this visit:  Arthritis of knee  Mobility impaired  Lymphedema  Hypothyroidism, postsurgical  Hip pain, unspecified laterality  OSA (obstructive sleep apnea)  Right-sided heart failure, unspecified HF chronicity (HCC)  Prediabetes  Weakness of both lower extremities  Walker as ambulation aid  BMI 45.0-49.9, adult (HCC)  Other orders -     Semaglutide,0.25 or 0.5MG /DOS, (OZEMPIC, 0.25 OR 0.5 MG/DOSE,) 2 MG/1.5ML SOPN; Inject 0.25 mg into the skin once a week. -     Semaglutide,0.25 or 0.5MG /DOS, (OZEMPIC, 0.25 OR 0.5 MG/DOSE,) 2 MG/3ML SOPN; Inject 0.5 mg into the skin once a week.  Pending call back

## 2023-10-17 ENCOUNTER — Telehealth: Payer: Self-pay

## 2023-10-17 DIAGNOSIS — Z780 Asymptomatic menopausal state: Secondary | ICD-10-CM

## 2023-10-17 DIAGNOSIS — Z1231 Encounter for screening mammogram for malignant neoplasm of breast: Secondary | ICD-10-CM

## 2023-10-17 NOTE — Telephone Encounter (Signed)
I have received a request to do a Prior Authorization for ''Ozempic''. However Ozempic id FDA-approved only for improving blood sugar (glucose) levels in adults with type 2 diabetes mellitus. There is no documentation in the patient's chart of type 2 diabetes, and the patient does not meet the criteria for a PA at this time.

## 2023-10-22 ENCOUNTER — Other Ambulatory Visit: Payer: Self-pay | Admitting: Medical

## 2023-10-22 ENCOUNTER — Other Ambulatory Visit (HOSPITAL_COMMUNITY): Payer: Self-pay

## 2023-10-22 ENCOUNTER — Telehealth: Payer: Self-pay | Admitting: Internal Medicine

## 2023-10-22 ENCOUNTER — Telehealth: Payer: Self-pay

## 2023-10-22 DIAGNOSIS — Z6841 Body Mass Index (BMI) 40.0 and over, adult: Secondary | ICD-10-CM

## 2023-10-22 MED ORDER — NALTREXONE-BUPROPION HCL ER 8-90 MG PO TB12: 2.0000 | ORAL_TABLET | Freq: Two times a day (BID) | ORAL | 1 refills | Status: DC

## 2023-10-22 NOTE — Telephone Encounter (Signed)
Pt was notified. Pt will call Monday or Tuesday and get on the schedule for flu or covid    I have put in order for mammogram and bone density

## 2023-10-22 NOTE — Telephone Encounter (Signed)
Hi Sabrina,                          I have done a p/a for the requested drug. However the pt still has a hefty coinsurance she has to pay of 615.00. So the plan states the drug is available without an authorization required.  Unfortunately her cost with ins is around/about 622.00

## 2023-10-22 NOTE — Telephone Encounter (Signed)
PA Needed

## 2023-10-22 NOTE — Telephone Encounter (Signed)
 Sent to prior auth team

## 2023-10-22 NOTE — Telephone Encounter (Signed)
Pt was notified and is agreeable to this. However, she wants to make sure this is safe since she doesn't have a thryoid and its not going to cause blood clots or make her bp go up

## 2023-10-22 NOTE — Addendum Note (Signed)
Addended by: Herminio Commons A on: 10/22/2023 09:28 AM   Modules accepted: Orders

## 2023-10-23 NOTE — Telephone Encounter (Signed)
 Left message for pt to call me back

## 2023-10-24 NOTE — Addendum Note (Signed)
Addended by: Herminio Commons A on: 10/24/2023 12:05 PM   Modules accepted: Orders

## 2023-10-24 NOTE — Telephone Encounter (Signed)
Patient was notified.   She was also inquiring about her sleep study results, as she not heard from anyone on her sleep study to see if she needs a CPAP machine or not. I see sleep study was entered in media on 09/04/23 but not sure it was sent directly for you to review.   Natasha [name]

## 2023-10-24 NOTE — Addendum Note (Signed)
Addended by: Herminio Commons A on: 10/24/2023 12:04 PM   Modules accepted: Orders

## 2023-10-28 NOTE — Telephone Encounter (Signed)
 Thank you for letting me know. I did not place the order so I was unaware she had this testing completed. It looks as though Vincenza Hews ordered this for her recently.   Based on the results, we classify her sleep apnea as severe based on her oxygen levels dropping to 79% during the study.    I do recommend CPAP therapy. She may benefit from a titration study to find the optimal settings and if oxygen supplement is needed with her specific needs.   With a fall of oxygen to 79% and 10% of the time asleep below 88% oxygen with only mild levels of pauses or obstructions, she is likely having longer apneic episodes. This can sometimes require supplemental oxygen, but we don't know without the titration study.   The study is completed by a sleep specialist.  If she would like to start with CPAP first, we can place the order for this with auto-pap settings and monitor closely.   Please call to let her know the above findings. If she would like to have a titration study completed, we can send a referral for overnight CPAP titration study.   Otherwise, I will be happy to place the order for autopap and we can see of her symptoms improve.

## 2023-10-29 NOTE — Telephone Encounter (Signed)
 Sarabeth sent this back to me, but meant to send to you. Can you help do this,

## 2023-10-29 NOTE — Telephone Encounter (Signed)
Tried to call pt.  LMTCB

## 2023-11-05 ENCOUNTER — Ambulatory Visit
Admission: RE | Admit: 2023-11-05 | Discharge: 2023-11-05 | Disposition: A | Discharge status: Home / Self Care | Payer: Medicare PPO | Source: Ambulatory Visit | Attending: Medical | Admitting: Medical

## 2023-11-05 DIAGNOSIS — Z1231 Encounter for screening mammogram for malignant neoplasm of breast: Secondary | ICD-10-CM

## 2023-11-12 ENCOUNTER — Other Ambulatory Visit (HOSPITAL_COMMUNITY): Payer: Self-pay

## 2023-12-05 DIAGNOSIS — H25813 Combined forms of age-related cataract, bilateral: Secondary | ICD-10-CM | POA: Diagnosis not present

## 2023-12-05 DIAGNOSIS — H353111 Nonexudative age-related macular degeneration, right eye, early dry stage: Secondary | ICD-10-CM | POA: Diagnosis not present

## 2023-12-05 DIAGNOSIS — H40013 Open angle with borderline findings, low risk, bilateral: Secondary | ICD-10-CM | POA: Diagnosis not present

## 2023-12-05 DIAGNOSIS — H353122 Nonexudative age-related macular degeneration, left eye, intermediate dry stage: Secondary | ICD-10-CM | POA: Diagnosis not present

## 2023-12-08 ENCOUNTER — Ambulatory Visit: Payer: Self-pay

## 2023-12-08 NOTE — Telephone Encounter (Signed)
 This RN made a second attempt to contact the patient. Patient did not answer, RN LVM with a callback number.

## 2023-12-08 NOTE — Telephone Encounter (Signed)
 1st attempt-left VM   Copied from CRM [id number]. Topic: Clinical - Medication Question >> Dec 08, 2023  1:47 PM Yolanda T wrote: Reason for CRM: patient called stated she is having leg, feet and ankle swelling when taking amLODipine (NORVASC) 5 MG tablet. She is requesting a medication change. Patient says she need a text before she is called as she has robo call killer on her phone. She also stated she needs the med sent to [facility] at [facility] [place] [address]. F/u with patient at [phone]

## 2023-12-08 NOTE — Telephone Encounter (Signed)
 3rd attempt-Left VM, routing to clinic Reason for Disposition . Third attempt to contact caller AND no contact made. Phone number verified.  Protocols used: No Contact or Duplicate Contact Call-A-AH

## 2023-12-10 ENCOUNTER — Ambulatory Visit: Payer: Self-pay

## 2023-12-10 DIAGNOSIS — I10 Essential (primary) hypertension: Secondary | ICD-10-CM

## 2023-12-10 DIAGNOSIS — I890 Lymphedema, not elsewhere classified: Secondary | ICD-10-CM | POA: Diagnosis not present

## 2023-12-10 NOTE — Addendum Note (Signed)
 Addended by: Kiaria Quinnell, Huntley Dec E on: 12/10/2023 04:11 PM   Modules accepted: Orders

## 2023-12-10 NOTE — Telephone Encounter (Signed)
 Chief Complaint: bilateral leg swelling Symptoms: swelling to bilateral feet, ankles, legs to above the knee Frequency: since February, getting worse Pertinent Negatives: Patient denies CP, SOB, pain on palpation, fever Disposition: [] ED /[x] Urgent Care (no appt availability in office) / [] Appointment(In office/virtual)/ []  [place] [facility]/ [] Home Care/ [] Refused Recommended Disposition /[] [place] [facility]/ []  Follow-up with PCP Additional Notes: Pt reports worsening bilateral leg swelling since February. Pt states she takes amlodipine and received a text from a pharmacist that swelling is a side effect of amlodipine. Pt is living in the [place] area for the next 3 months and would like to be taken off amlodipine and prescribed something else. Pt also takes valsartan.  Pt states er feet, ankles, and legs are swollen to right above the knee. Pt has had to buy two new pairs of shoes in larger sizes. Pt states walking is difficult because her legs are so tight. Pt denies pain on palpation. Pt states there is a "small sore spot with a scan" and approx 4 areas on her legs that look like they are about to open and start weeping fluid. Pt on Xarelto for a PE in 2020. Pt also endorses numbness in her toes. Denies CP, SOB, diaphoresis, N/V, palpitations.   RN advised pt she should go to an UC within 4 hours for her symptoms to r/o DVT or cellulitis. Pt verbalized understanding but states her family members are at work right now but she will call them for a ride. RN advised pt if she develops SOB or CP she needs to call 911. Pt verbalized understanding.  [facility] [address] [place] [address]       Copied from CRM [id number]. Topic: Clinical - Medication Question >> Dec 08, 2023  1:47 PM Yolanda T wrote: Reason for CRM: patient called stated she is having leg, feet and ankle swelling when taking amLODipine (NORVASC) 5 MG tablet. She is requesting a medication change.  Patient says she need a text before she is called as she has robo call killer on her phone. She also stated [facility] [place] [address]. F/u with patient at [phone] Reason for Disposition  SEVERE leg swelling (e.g., swelling extends above knee, entire leg is swollen, weeping fluid)  Answer Assessment - Initial Assessment Questions 1. ONSET: "When did the swelling start?" (e.g., minutes, hours, days)     "Ongoing swelling for a while and my legs were swollen but not to this extent, it got worse in February and they got really, really big. I got accustomed to them being swollen and I had to buy new shoes two sizes up" 2. LOCATION: "What part of the leg is swollen?"  "Are both legs swollen or just one leg?"     Feet, ankle, legs bilaterally  3. SEVERITY: "How bad is the swelling?" (e.g., localized; mild, moderate, severe)   - Localized: Small area of swelling localized to one leg.   - MILD pedal edema: Swelling limited to foot and ankle, pitting edema < 1/4 inch (6 mm) deep, rest and elevation eliminate most or all swelling.   - MODERATE edema: Swelling of lower leg to knee, pitting edema > 1/4 inch (6 mm) deep, rest and elevation only partially reduce swelling.   - SEVERE edema: Swelling extends above knee, facial or hand swelling present.      "Right above my knee all the way to  my feet" - severe 4. REDNESS: "Does the swelling look red or infected?"     "A little sore but it's really small, it had a scab on it, it was a quarter of an inch big. I have some areas now that look like they may start weeping. About 4 places that look like that." 5. PAIN: "Is the swelling painful to touch?" If Yes, ask: "How painful is it?"   (Scale 1-10; mild, moderate or severe)     Hard to walk because it is so tight, "my knees are in agony d/t arthritis", rates pain 3/10, states the back of her knee hurts. Denies pain to the touch.  6. FEVER: "Do you have a fever?"  If Yes, ask: "What is it, how was it measured, and when did it start?"      "I'm cold all the time but I think its Xarelto", no fever  7. CAUSE: "What do you think is causing the leg swelling?"     Amlodipine  8. MEDICAL HISTORY: "Do you have a history of blood clots (e.g., DVT), cancer, heart failure, kidney disease, or liver failure?"     Yes - PE in 202 w/ COVID and on Xarelto 9. RECURRENT SYMPTOM: "Have you had leg swelling before?" If Yes, ask: "When was the last time?" "What happened that time?"     Yes, never been this bad  10. OTHER SYMPTOMS: "Do you have any other symptoms?" (e.g., chest pain, difficulty breathing)       Pt reports difficulty walking d/t swelling, pt states she gets furosemide when she sees the doctor and she doesn't see a difference. Pt also reports taking valsartan. Hx of thyroid cancer. Takes synthroid for that. Pt was told to consult her doctor for swelling after taking amlodipine. Toes are numb. Discomfort to "L upper shoulder that comes and goes, I don't know if its gas or what it is." Denies CP or SOB. Denies palpitations. States a pharmacist warned her about swelling d/t amlodipine via text and to give [place] a call if she develops swelling  Protocols used: Leg Swelling and Edema-A-AH

## 2023-12-10 NOTE — Telephone Encounter (Unsigned)
 Copied from CRM [id number]. Topic: Clinical - Prescription Issue >> Dec 10, 2023 10:41 AM Beacher May wrote: Reason for CRM: Pt is calling with swelling in her legs, and needs a replacement medication for her amLODipine (NORVASC) 5 MG tablet causing her swelling. She is not at home, she is in [place], [place] for about 3 months and requesting [facility] [address] [facility] [place], [place].

## 2023-12-10 NOTE — Telephone Encounter (Signed)
 I recommend she take 2 valsartan and monitor her BP very closely to ensure that her readings are not too high or too low. SHould be less than 130/85 and more than 100/60.  Ideally, she may need to find a provider to help her while she is there since I wont be able to evaluate.

## 2023-12-12 DIAGNOSIS — J101 Influenza due to other identified influenza virus with other respiratory manifestations: Secondary | ICD-10-CM | POA: Diagnosis not present

## 2023-12-24 DIAGNOSIS — F411 Generalized anxiety disorder: Secondary | ICD-10-CM | POA: Diagnosis not present

## 2023-12-26 DIAGNOSIS — J018 Other acute sinusitis: Secondary | ICD-10-CM | POA: Diagnosis not present

## 2023-12-26 DIAGNOSIS — N3000 Acute cystitis without hematuria: Secondary | ICD-10-CM | POA: Diagnosis not present

## 2023-12-26 DIAGNOSIS — J180 Bronchopneumonia, unspecified organism: Secondary | ICD-10-CM | POA: Diagnosis not present

## 2023-12-29 ENCOUNTER — Other Ambulatory Visit: Payer: Self-pay

## 2023-12-29 ENCOUNTER — Ambulatory Visit: Payer: Self-pay

## 2023-12-29 DIAGNOSIS — I890 Lymphedema, not elsewhere classified: Secondary | ICD-10-CM

## 2023-12-29 DIAGNOSIS — R29898 Other symptoms and signs involving the musculoskeletal system: Secondary | ICD-10-CM

## 2023-12-29 DIAGNOSIS — R600 Localized edema: Secondary | ICD-10-CM

## 2023-12-29 NOTE — Telephone Encounter (Signed)
 Second message indicates that she is in [place] with family and not able to go to the referral.   I am not quite sure what can be done without her local to obtain appropriate measurements. It appears she has been seen in [place]. She may need wrapping of the legs over compression stockings if she is unable to get the required size due to the swelling.   Please call to determine what intervention she is requesting. Given the distance, we are really limited.

## 2023-12-29 NOTE — Telephone Encounter (Signed)
 Copied from CRM [id number]. Topic: Clinical - Red Word Triage >> Dec 29, 2023 10:13 AM Everette C wrote: [facility] that prompted transfer to Nurse Triage: The patient has recently traveled and began to experience significant swelling in their legs, the patient was seen in the ED for their swelling while traveling but is continuing to experience the swelling and discomfort    Chief Complaint: Pt. Is at her son's in [place] and is asking for orders for bilateral compression hose and a scooter to help "me get around." Fax to [phone]. Symptoms: Swelling to both legs, chronic. Frequency: Chronic Pertinent Negatives: Patient denies  Disposition: [] ED /[] Urgent Care (no appt availability in office) / [] Appointment(In office/virtual)/ []  [place] [facility]/ [] Home Care/ [] Refused Recommended Disposition /[] [place] [facility]/ [x]  Follow-up with PCP Additional Notes: Fax to [phone].  Reason for Disposition  [1] MODERATE leg swelling (e.g., swelling extends up to knees) AND [2] new-onset or worsening  Answer Assessment - Initial Assessment Questions 1. ONSET: "When did the swelling start?" (e.g., minutes, hours, days)     Chronic 2. LOCATION: "What part of the leg is swollen?"  "Are both legs swollen or just one leg?"     Both 3. SEVERITY: "How bad is the swelling?" (e.g., localized; mild, moderate, severe)   - Localized: Small area of swelling localized to one leg.   - MILD pedal edema: Swelling limited to foot and ankle, pitting edema < 1/4 inch (6 mm) deep, rest and elevation eliminate most or all swelling.   - MODERATE edema: Swelling of lower leg to knee, pitting edema > 1/4 inch (6 mm) deep, rest and elevation only partially reduce swelling.   - SEVERE edema: Swelling extends above knee, facial or hand swelling present.      Moderate 4. REDNESS: "Does the swelling look red or infected?"     No 5. PAIN: "Is the swelling painful to touch?" If Yes, ask: "How painful is it?"   (Scale  1-10; mild, moderate or severe)     severe 6. FEVER: "Do you have a fever?" If Yes, ask: "What is it, how was it measured, and when did it start?"      no 7. CAUSE: "What do you think is causing the leg swelling?"     unsure 8. MEDICAL HISTORY: "Do you have a history of blood clots (e.g., DVT), cancer, heart failure, kidney disease, or liver failure?"     no 9. RECURRENT SYMPTOM: "Have you had leg swelling before?" If Yes, ask: "When was the last time?" "What happened that time?"     Yes 10. OTHER SYMPTOMS: "Do you have any other symptoms?" (e.g., chest pain, difficulty breathing)       no 11. PREGNANCY: "Is there any chance you are pregnant?" "When was your last menstrual period?"       no  Protocols used: Leg Swelling and Edema-A-AH

## 2023-12-31 DIAGNOSIS — F411 Generalized anxiety disorder: Secondary | ICD-10-CM | POA: Diagnosis not present

## 2024-01-01 ENCOUNTER — Other Ambulatory Visit: Payer: Self-pay | Admitting: Nurse Practitioner

## 2024-01-01 DIAGNOSIS — R2681 Unsteadiness on feet: Secondary | ICD-10-CM

## 2024-01-01 DIAGNOSIS — R262 Difficulty in walking, not elsewhere classified: Secondary | ICD-10-CM

## 2024-01-01 DIAGNOSIS — R29898 Other symptoms and signs involving the musculoskeletal system: Secondary | ICD-10-CM

## 2024-01-01 DIAGNOSIS — I890 Lymphedema, not elsewhere classified: Secondary | ICD-10-CM

## 2024-01-01 DIAGNOSIS — Z7409 Other reduced mobility: Secondary | ICD-10-CM

## 2024-01-01 DIAGNOSIS — I872 Venous insufficiency (chronic) (peripheral): Secondary | ICD-10-CM

## 2024-01-01 MED ORDER — MEDICAL COMPRESSION STOCKINGS MISC: 2 refills | Status: AC

## 2024-01-01 MED ORDER — AMBULATORY NON FORMULARY MEDICATION
0 refills | Status: AC
Start: 2024-01-01 — End: ?

## 2024-01-01 NOTE — Progress Notes (Signed)
 Orders placed for compression stockings and motorized scooter based on insurance coverage to be faxed to requested DME.

## 2024-01-06 DIAGNOSIS — N3000 Acute cystitis without hematuria: Secondary | ICD-10-CM | POA: Diagnosis not present

## 2024-01-06 DIAGNOSIS — F411 Generalized anxiety disorder: Secondary | ICD-10-CM | POA: Diagnosis not present

## 2024-01-08 ENCOUNTER — Telehealth: Payer: Self-pay | Admitting: Nurse Practitioner

## 2024-01-08 DIAGNOSIS — Z86711 Personal history of pulmonary embolism: Secondary | ICD-10-CM

## 2024-01-08 NOTE — Telephone Encounter (Signed)
 Copied from CRM [id number]. Topic: Clinical - Medication Refill >> Jan 08, 2024  3:32 PM Santiya F wrote: Medication: rivaroxaban  (XARELTO ) 20 MG TABS tablet  Has the patient contacted their pharmacy? Yes  (Agent: If yes, when and what did the pharmacy advise?) Contact office, prescription expired.   This is the patient's preferred pharmacy:  [id number] [place] - [address] [place] [address] Phone: [phone] Fax: [phone]  Is this the correct pharmacy for this prescription? Yes If no, delete pharmacy and type the correct one.   Has the prescription been filled recently? Yes  Is the patient out of the medication? Yes  Has the patient been seen for an appointment in the last year OR does the patient have an upcoming appointment? Yes  Can we respond through MyChart? Yes  Agent: Please be advised that Rx refills may take up to 3 business days. We ask that you follow-up with your pharmacy.

## 2024-01-09 ENCOUNTER — Other Ambulatory Visit: Payer: Self-pay | Admitting: Nurse Practitioner

## 2024-01-09 DIAGNOSIS — Z86711 Personal history of pulmonary embolism: Secondary | ICD-10-CM

## 2024-01-09 MED ORDER — RIVAROXABAN 20 MG PO TABS: 20.0000 mg | ORAL_TABLET | Freq: Every day | ORAL | 3 refills | Status: DC

## 2024-01-09 NOTE — Telephone Encounter (Signed)
 [facility] [phone] [address] [facility]. [id number] [address] deleted.

## 2024-01-09 NOTE — Telephone Encounter (Signed)
 The patient called in and stated the prescription went to the wrong pharmacy. After looking I told her it went to the [facility] on [address] in [place]  but she said no it should have gone to the [facility] in [place]. She is going to call the [facility]S in [place]  to see if they can transfer it to the right location   [id number] [place] - [address] Phone: [phone]  Fax: [phone]

## 2024-01-14 ENCOUNTER — Telehealth: Payer: Self-pay | Admitting: Nurse Practitioner

## 2024-01-14 NOTE — Telephone Encounter (Signed)
 Copied from CRM [id number]. Topic: Clinical - Medication Question >> Jan 14, 2024 10:00 AM Natasha [name] wrote: Patient's prescription rivaroxaban  (XARELTO ) 20 MG TABS tablet- was thrown away by house keeping and she wants to know what can be done. She is concerned. Callback [phone]

## 2024-01-14 NOTE — Telephone Encounter (Signed)
   Since this was refilled on 01/09/24 she will need to contact her pharmacy and ask them if this can be refilled again. She may have to pay out of pocket for medication.

## 2024-01-14 NOTE — Telephone Encounter (Signed)
 [facility]/w Mo[facility] Andrews she tried to do a loss override & wouldn't go thru until 03/24/24, said call ins they are only option. T# [phone] [employer] for loss override for Xarelto . Called pharmacy & was transferred & waited & then was disconnected

## 2024-01-15 ENCOUNTER — Other Ambulatory Visit: Payer: Self-pay

## 2024-01-15 MED ORDER — APIXABAN 5 MG PO TABS: 5.0000 mg | ORAL_TABLET | Freq: Two times a day (BID) | ORAL | 1 refills | Status: DC

## 2024-01-15 NOTE — Progress Notes (Signed)
   01/15/2024  Patient ID: Natasha [name], female   DOB: 03/05/1951, 73 y.o.   MRN: [id number]  Received notice that patient's Xarelto  had been lost after being thrown away by accident. Contacted insurance to ask for lost medication override and they do not allow overrides for lost medication unless it was in an event of a natural disaster.  PCP out of office, spoke with covering provider. Going to send in enough Eliquis for patient to take until xarelto  can be filled again at pharmacy, which insurance will hopefully allow.  Carnell Christian, PharmD Clinical Pharmacist [phone]

## 2024-01-15 NOTE — Telephone Encounter (Signed)
 Contacted patient back to notify her that insurance would allow Eliquis to go through as temp replacement for xarelto . Patient states she actually found the xarelto  and is all set. [facility] eliquis [facility]. No other concerns at this time.

## 2024-01-15 NOTE — Telephone Encounter (Signed)
 Please give patient the phone number that they will need to call.

## 2024-01-21 DIAGNOSIS — F411 Generalized anxiety disorder: Secondary | ICD-10-CM | POA: Diagnosis not present

## 2024-01-27 ENCOUNTER — Other Ambulatory Visit: Payer: Self-pay

## 2024-01-27 ENCOUNTER — Telehealth: Payer: Self-pay

## 2024-01-27 ENCOUNTER — Other Ambulatory Visit: Payer: Self-pay | Admitting: Nurse Practitioner

## 2024-01-27 DIAGNOSIS — I10 Essential (primary) hypertension: Secondary | ICD-10-CM

## 2024-01-27 MED ORDER — VALSARTAN 160 MG PO TABS
160.0000 mg | ORAL_TABLET | Freq: Every day | ORAL | 0 refills | Status: DC
Start: 2024-01-27 — End: 2024-04-26

## 2024-01-27 NOTE — Telephone Encounter (Signed)
 I have sent this in for th ept. To the requested pharmacy.   Copied from CRM [id number]. Topic: Clinical - Prescription Issue >> Jan 27, 2024  3:58 PM Natasha [name] T wrote: Reason for CRM: patient called stated the pharmacy is needing the nurse or provider to call the doctors line at [phone] and ask for Chanel to get her valsartan  (DIOVAN ) 160 MG tablet at the [facility] at  [facility] [address] [place] [address] Phone: [phone] Fax: [phone]

## 2024-01-27 NOTE — Telephone Encounter (Unsigned)
 Copied from CRM [id number]. Topic: Clinical - Medication Refill >> Jan 27, 2024  3:16 PM Donald Frost wrote: Medication: valsartan  (DIOVAN ) 160 MG tablet  Has the patient contacted their pharmacy? No   This is the patient's preferred pharmacy:   [id number] [place] - [address] [place] [address] Phone: [phone] Fax: [phone]  Is this the correct pharmacy for this prescription? Yes If no, delete pharmacy and type the correct one.   Has the prescription been filled recently? No  Is the patient out of the medication? Yes and she is coming back to  on Friday and didn't realize she needed to call a couple of days before she ran out.   Has the patient been seen for an appointment in the last year OR does the patient have an upcoming appointment? Yes  Can we respond through MyChart? Yes  Please assist patient further as she took her last pill yesterday Monday 01/26/24 and just didn't realize she couldn't just get it called in today. Please contact her son as she has most numbers blocked due to spam. His name is Dr Alyne Jules [phone]

## 2024-02-04 DIAGNOSIS — F411 Generalized anxiety disorder: Secondary | ICD-10-CM | POA: Diagnosis not present

## 2024-02-10 ENCOUNTER — Ambulatory Visit: Admitting: Occupational Therapy

## 2024-02-11 DIAGNOSIS — F411 Generalized anxiety disorder: Secondary | ICD-10-CM | POA: Diagnosis not present

## 2024-02-12 ENCOUNTER — Ambulatory Visit: Attending: Nurse Practitioner | Admitting: Occupational Therapy

## 2024-02-12 DIAGNOSIS — I890 Lymphedema, not elsewhere classified: Secondary | ICD-10-CM | POA: Insufficient documentation

## 2024-02-12 NOTE — Therapy (Signed)
 OUTPATIENT OCCUPATIONAL THERAPY EVALUATION  LOWER EXTREMITY LYMPHEDEMA  Patient Name: Natasha [name] MRN: [id number] DOB:10/08/1950, 73 y.o., female Today's Date: 02/16/2024  END OF SESSION:  .  OT End of Session - 02/16/24 0952     Visit Number 1    Number of Visits 36    Date for OT Re-Evaluation 05/13/24    OT Start Time 0907    OT Stop Time 1019    OT Time Calculation (min) 72 min    Activity Tolerance Patient tolerated treatment well;No increased pain            Past Medical History:  Diagnosis Date   Abdominal spasms 12/14/2020   Acute pulmonary embolism (HCC) 08/21/2019   Bacterial conjunctivitis of both eyes 12/14/2020   Bilateral leg edema 02/10/2020   Bradycardia    Breast cancer screening by mammogram 11/24/2020   CARCINOMA, THYROID  GLAND, PAPILLARY 05/04/2008   Stage 2, 8/09: thyroidectomy for 2.7cm papillary adenocarcinoma (t2 n0 mo) 9/09: I-131 rx, 108 mci 05/10: tg is neg (ab neg) , total body scan is neg   Colon cancer screening 11/24/2020   Colon polyps    Complication of anesthesia    trouble with airway   Cough 12/25/2007   Qualifier: Diagnosis of  By: Maryjane Snider LPN, Megan     [id number] 08/19/2019   Diverticulosis    DVT (deep venous thrombosis) (HCC)    Edema 12/22/2008   Encounter to establish care 11/24/2020   History of 2019 novel coronavirus disease (COVID-19) 11/09/2019   HYPERTENSION 12/25/2007   HYPOTHYROIDISM, POSTSURGICAL 05/04/2008   LEG PAIN, LEFT 12/09/2008   Lumbago with sciatica, right side 01/08/2021   Memory loss due to medical condition 11/09/2019   Muscle weakness    Nausea and vomiting 05/23/2008   Qualifier: Diagnosis of  By: Washington Hacker MD, Corbett Desanctis of this note might be different from the original. Qualifier: Diagnosis of  By: Washington Hacker MD, Sean A   Numbness and tingling    OSA (obstructive sleep apnea) 06/15/2013   CPAP   Paresthesia 01/04/2020   Peripheral edema 12/22/2008   Qualifier: Diagnosis of  By: Stann Earnest, MD, Lovena Rubinstein     Pneumonia due to COVID-19 virus    Pulmonary embolism (HCC)    Pulmonary embolism (HCC) 08/23/2019   Right lower quadrant pain 11/24/2020   Sciatica of left side 12/04/2011   Skin sensation disturbance 12/05/2008   Qualifier: Diagnosis of  By: Myrtice Athens MD, Florencia Hunter of this note might be different from the original. Qualifier: Diagnosis of  By: Myrtice Athens MD, Tarri Farm A   Spasm of muscle of lower back 12/14/2020   Vaginal candidiasis 03/16/2021   Weakness 01/04/2020   Past Surgical History:  Procedure Laterality Date   ABDOMINAL HYSTERECTOMY     due to bleeding, ovaries remain   ANKLE SURGERY Right    CERVICAL FUSION     x 2   COLONOSCOPY WITH PROPOFOL  N/A 08/08/2015   Procedure: COLONOSCOPY WITH PROPOFOL ;  Surgeon: Tami Falcon, MD;  Location: [facility];  Service: Endoscopy;  Laterality: N/A;   KNEE SURGERY Left    TOTAL THYROIDECTOMY     Patient Active Problem List   Diagnosis Date Noted   BMI 45.0-49.9, adult (HCC) 10/16/2023   Chronic venous insufficiency 09/10/2023   Mobility impaired 09/10/2023   Weakness of both lower extremities 09/10/2023   Gait instability 09/10/2023   Lymphedema 09/10/2023   Atrophic vaginitis 05/14/2023   Pelvic floor dysfunction 05/14/2023  Hypothyroidism 05/14/2023   Pancreatic insufficiency 05/14/2023   Arthritis of knee 03/24/2023   At risk for fall due to comorbid condition 03/10/2023   Non-recurrent acute suppurative otitis media of left ear without spontaneous rupture of tympanic membrane 02/17/2023   Mood changes 02/17/2023   Neck pain 01/31/2023   Decreased ROM of neck 01/31/2023   Upper back pain 01/31/2023   Acute pain of right shoulder 01/31/2023   Dysuria 01/31/2023   Recurrent urinary tract infection 01/31/2023   Urine frequency 01/31/2023   Walker as ambulation aid 01/31/2023   Ambulatory dysfunction 01/31/2023   Snoring 12/10/2022   Low TSH level 12/10/2022   Prediabetes 11/27/2022   Morbid obesity (HCC) 11/26/2022    BMI 50.0-59.9, adult (HCC) 11/26/2022   Elevated glucose 11/26/2022   B12 deficiency 11/26/2022   Health care maintenance 11/26/2022   SOB (shortness of breath) on exertion 11/26/2022   Other fatigue 11/26/2022   Dermatofibroma 10/11/2022   Candida infection 10/11/2022   Fatty liver 04/20/2021   Incontinence of urine in female 04/20/2021   Vitamin D  deficiency 03/16/2021   Hip pain 12/22/2020   Constipation 11/24/2020   Gastroesophageal reflux disease 11/24/2020   Groin pain, chronic, right 11/24/2020   Chronic right-sided low back pain with right-sided sciatica 11/24/2020   OAB (overactive bladder) 11/24/2020   Aortic atherosclerosis (HCC) 11/24/2020   Right-sided heart failure (HCC) 11/24/2020   History of pulmonary embolism 02/10/2020   Bilateral lower extremity edema 02/10/2020   Gait abnormality 01/04/2020   Muscle weakness (generalized) 11/09/2019   Pulmonary nodule 11/09/2019   Obesity, morbid, BMI 50 or higher (HCC) 08/24/2019   OSA (obstructive sleep apnea) 06/15/2013   Allergic rhinitis 01/21/2009   History of papillary adenocarcinoma of thyroid  05/04/2008   Hypothyroidism, postsurgical 05/04/2008   Essential hypertension 12/25/2007   Dyspnea 12/25/2007    PCP: Archibald Beard, NP  REFERRING PROVIDER: Archibald Beard, NP  REFERRING DIAG: I89.0  THERAPY DIAG:  Lymphedema, not elsewhere classified  Rationale for Evaluation and Treatment: Rehabilitation  ONSET DATE: >15 years  SUBJECTIVE:                                                                                                                                                                                           SUBJECTIVE STATEMENT:  PERTINENT HISTORY:  Hypothyroidism, postsurgical Essential hypertension Dyspnea OSA (obstructive sleep apnea) Obesity, morbid, BMI 50 or higher (HCC) Muscle weakness (generalized) Gait abnormality History of pulmonary embolism Bilateral lower extremity edema Chronic  right-sided low back pain with right-sided sciatica Right-sided heart failure (HCC) Hip pain Incontinence of urine in female Dermatofibroma Morbid obesity (HCC) BMI 50.0-59.9, adult (  HCC) SOB (shortness of breath) on exertion Other fatigue Prediabetes Decreased ROM of neck Upper back pain Acute pain of right shoulder Recurrent urinary tract infection Urine frequency Walker as ambulation aid Ambulatory dysfunction Mood changes At risk for fall due to comorbid condition Arthritis of knee Hypothyroidism Chronic venous insufficiency Mobility impaired Weakness of both lower extremities Gait instability Lymphedema BMI 45.0-49.9, adult (HCC)  PAIN:  Are you having pain? Yes, B knees: NPRS scale: 6/10 Pain location:  Pain description: creaky crackly, tight, heavy, sore, tender. With and without weight bearing Aggravating factors: standing, walking, weight bearing Relieving factors: walking  PRECAUTIONS: Other: LYMPHEDEMA PRECAUTIONS: prediabetic, hypothyroid,  RED FLAGS: Urinary incontinence; numbness and tingling in fingers, hands and toes   WEIGHT BEARING RESTRICTIONS: No  FALLS:  Has patient fallen in last 6 months? No  LIVING ENVIRONMENT: Lives with: lives with their daughter and  granddaughter Lives in: House/apartment Stairs: Yes; Internal: 14 steps; on right going up and External: garage 4 steps steps; can reach both Has following equipment at home: Walker - 4 wheeled, shower chair, and elevated toilet seat  OCCUPATION: retired Careers information officer professor  LEISURE: concerts, writing and producing plays  HAND DOMINANCE: right   PRIOR LEVEL OF FUNCTION: Independent  PATIENT GOALS: walk unassisted; lose weight   OBJECTIVE: Note: Objective measures were completed at Evaluation unless otherwise noted.  COGNITION:  Overall cognitive status: Within functional limits for tasks assessed   OBSERVATIONS / OTHER ASSESSMENTS:   POSTURE: head forward, slight shoulder  protraction  LE ROM: Limited at hips knees and ankles 2/2 body habitus and skin approximation  LE MMT: generalized weakness  LYMPHEDEMA ASSESSMENTS:   BLE COMPARATIVE LIMB VOLUMETRICS: TBA OT Rx 1  LANDMARK RIGHT   R LEG (A-D) N/A  R THIGH (E-G) ml  R FULL LIMB (A-G) ml  Limb Volume differential (LVD)  %  Volume change since initial %  Volume change overall V  (Blank rows = not tested)  LANDMARK LEFT  L LEG (A-D) N/A  L  THIGH (E-G) ml  L  FULL LIMB (A-G) ml  Limb Volume differential (LVD)  %  Volume change since initial %  Volume change overall %  (Blank rows = not tested)      SKIN/TISSUE INTEGRITY: : Moderate, Stage  II, Bilateral Lower Extremity Lymphedema 2/2 CVI,  Obesity, and Mm weakness  Skin  Description Hyper-Keratosis Peau d' Orange Shiny Tight Fibrotic/ Indurated Fatty Hard Spongy/ boggy   x   x R>L x x x   Skin dry Flaky WNL Macerated   mildly      Color Redness Varicosities Blanching Hemosiderin Stain Mottled        x   Odor Malodorous Yeast Fungal infection  WNL      x   Temperature Warm Cool wnl    x     Pitting Edema   1+ 2+ 3+ 4+ Non-pitting   x         Girth Symmetrical Asymmetrical                   Distribution    R>L toes to groin, bilaterally    Stemmer Sign Positive Negative   +    Lymphorrhea History Of:  Present Absent     x    Wounds History Of Present Absent Venous Arterial Pressure Sheer   denies  x        Signs of Infection Redness Warmth Erythema Acute Swelling Drainage Borders  Sensation Light Touch Deep pressure Hypersensitivity   In tact Impaired In tact Impaired Absent Impaired   x  x  x     Nails WNL   Fungus nail dystrophy   TBA     Hair Growth Symmetrical Asymmetrical   TB Ax    Skin Creases Base of toes  Ankles   Base of Fingers knees       Abdominal pannus Thigh Lobules  Face/neck   x x  x       GAIT: Pt transported to clinic in transport wheelchair Distance  walked: Pt able to transfer out of wc and walk 4-5 steps using 2 wheeled walker to Rx bed with extra time (modified independent).  Assistive device utilized: Pt able to lift legs onto Rx bed, one at a time, using hands to actively assist Level of assistance: Modified independence Comments: Transfers  and bed mobility take an inordinate amount of time  LYMPHEDEMA LIFE IMPACT SCALE (LLIS): Initial: 86.76% (The extent to which LE-related problems impacted your life over the past week.)                                                                                                                           TREATMENT DATE: 02/12/24 OT evaluation Pt/ CG edu   PATIENT EDUCATION:  Discussed differential diagnoses for various swelling disorders. Provided basic level education regarding lymphatic structure and function, etiology, onset patterns, stages of progression, and prevention to limit infection risk, worsening condition and further functional decline. Pt edu for aught interaction between blood circulatory system and lymphatic circulation.Discussed  impact of gravity and co-morbidities on lymphatic function. Outlined Complete Decongestive Therapy (CDT)  as standard of care and provided in depth information regarding 4 primary components of Intensive and Self Management Phases, including Manual Lymph Drainage (MLD), compression wrapping and garments, skin care, and therapeutic exercise. Samuel Crock discussion with re need for frequent attendance and high burden of care when caregiver is needed, impact of co morbidities. We discussed  the chronic, progressive nature of lymphedema and Importance of daily, ongoing LE self-care essential for limiting progression and infection risk.  Person educated: Patient  Education method: Explanation, Demonstration, and Handouts Education comprehension: verbalized understanding, returned demonstration, verbal cues required, and needs further education  LYMPHEDEMA SELF-CARE  HOME PROGRAM: BLE lymphatic pumping there ex- 1 set of 10 each element, in order. Hold 5. 2 x daily 2. Daily, short stretch, thigh length, multilayer compression bandages during Intensive Phase CDT 3. During self-Management Phase fit with appropriate compression garments and/ or devices 3. Daily skin care with low ph lotion matching skin ph 4. Daily simple self MLD   ASSESSMENT:  CLINICAL IMPRESSION: Natasha [name] is a 73 yo female presenting with moderate, stage  II, bilateral lower extremity lymphedema 2/2 CVI,  obesity, and mm weakness. She will benefit from skilled Occupational Therapy to reduce limb swelling and associated pain, to limit progression and infection risk. BLE lymphedema limits functional performance  in all occupational domains, including functional mobility and ambulation,  basic and instrumental ADLs, productive activities, leisure pursuits, social participation and quality of life. This patient will benefit from modified Intensive and Self Management Phase CDT . In addition to MLD, ther ex, skin care and compression bandaging below the knees, Pt will be fitted with custom knee length compression stockings paired with off the shelf , Capri length compression leggings. If insurance benefits allow, she may undergo a trial in the clinic with the advanced Flexitouch device in an effort to reduce discomfort and reduce infection risk. Without skilled OT Li-lymphedema will worsen over time and further functional decline is expected.    Natasha [name] is unable to reach feet and distal legs to apply multilayer , gradient compression wraps during the Intensive Phase of CDT.  She understands this limitation and agrees to  arrange for a caregiver to assist her daily with compression wrapping between OT visits. With a caregiver to assist her with Intensive Phase compression her prognosis is fair. Without CG assistance her prognosis is poor.  OBJECTIVE IMPAIRMENTS: decreased activity tolerance,  decreased balance, decreased endurance, decreased knowledge of condition, decreased knowledge of use of DME, decreased mobility, difficulty walking, decreased ROM, decreased strength, increased edema, impaired UE functional use, postural dysfunction, obesity, pain, and chronic leg swelling.   ACTIVITY LIMITATIONS: ACTIVITY LIMITATIONS: Mobility and functional ambulation limitations:  abnormal gait pattern, difficulty walking, carrying, lifting, bending, sitting, standing, squatting, stairs, transfers, and bed mobility Basic and instrumental ADLs (reaching feet and distal legs to groom nails, inspect skin, apply lotion, bathe lower body, difficulty with LB dressing, including fitting LB clothing, shoes and socks, impaired sleeping, meal prep, standing to cook, driving, shopping, yard work, house work Paediatric nurse activities: work related activities requiring extended standing, walking, sitting; caring for others Social participation in the community, socializing with others, LE affects body image  Leisure pursuits requiring extended standing, walking, sitting  PERSONAL FACTORS: Fitness, Time since onset of injury/illness/exacerbation, and 3+ comorbidities: Obesity, arthritis, OSA are also affecting patient's functional outcome.   REHAB POTENTIAL: Fair with daily caregiver assistance with gradient compression wrapping. Without assistance prognosis is poor as Pt is unable to apply wraps independently  EVALUATION COMPLEXITY: Moderate   GOALS: Goals reviewed with patient? Yes   SHORT TERM GOALS: Target date: 4th OT Rx visit  Pt will demonstrate understanding of lymphedema precautions and prevention strategies with modified independence using a printed reference to identify at least 5 precautions and discussing how s/he may implement them into daily life to reduce risk of progression with modified assistance Baseline: max a Goal status: INITIAL   2.  With Max caregiver assistance Pt will be able to  apply multilayer, knee length, compression wraps using gradient techniques to decrease limb volume, to limit infection risk, and to limit lymphedema progression.  Given this patient's Intake score of tbd % on the Lymphedema Life Impact Scale (LLIS), patient will experience a reduction of at least 5% in her perceived level of functional impairment resulting from lymphedema to improve functional performance and quality of life (QOL). Baseline: Dependent Goal status: INITIAL     LONG TERM GOALS: Target date: 05/13/24 Given this patient's Intake score of 86.76 % on the Lymphedema Life Impact Scale (LLIS), patient will experience a reduction of at least 10% in her perceived level of functional impairment resulting from lymphedema to improve functional performance and quality of life (QOL).Baseline: tbd Baseline: max a Goal status: INITIAL     2.  With modified  independence (extra time and assistive devices) Pt will be able to don and doff appropriate compression garments and/or devices to control BLE lymphedema and to limit progression.  Baseline: Dependent Goal status: INITIAL   3. Pt will achieve at least a 10% volume reductions bilaterally below the knees to return limb to more typical size and shape, to limit infection risk and LE progression, to decrease pain, to improve function, and to improve body image and QOL. Baseline: Dependent Goal status: INITIAL   4. Pt will achieve and sustain at least 85% compliance with all LE self-care home program components throughout CDT, including modified simple self-MLD, daily skin care and inspection, lymphatic pumping the ex and appropriate compression to limit lymphedema progression and to limit further functional decline. Baseline: Dependent. (Note: W/out daily assistance with donning/ doffing compression  , prognosis is poor.) Goal status: INITIAL   PLAN:  OT FREQUENCY: 2x/week and PRN  OT DURATION: other: 18 weeks and PRN  PLANNED INTERVENTIONS:   Complete Decongestive Therapy (CDT): Manual Lymphatic Drainage (MLD) , Skin care, ther ex, gradient [id number]-Therapeutic exercises, 97530- Therapeutic activity, 97535- Self Care, 16109- Manual therapy, Patient/Family education, Manual lymph drainage, Compression bandaging, DME instructions, and trial with advanced, sequential, pneumatic, compression device (Tactile Medical Flexitouch) Pt will arrange for a caregiver to assist her daily with compression wrapping between OT visits.  Custom-made gradient compression garments and HOS devices are medically necessary because they are uniquely sized and shaped to fit the exact dimensions of the affected extremities, and to provide appropriate medical grade, graduated compression essential for optimally managing chronic, progressive lymphedema. Multiple custom compression garments are needed to ensure proper hygiene to limit infection risk. Custom compression garments should be replaced q 3-6 months When worn consistently for optimal lipo-lymphedema self-management over time. HOS devices, medically necessary to limit fibrosis buildup in tissue, should be replaced q 2 years and PRN when worn out.   PLAN FOR NEXT SESSION:  Pt/ caregiver edu BLE comparative limb volumetrics  Knee length, Multilayer compression wraps to one leg only-Pt's choice.  Arnold Bicker, Natasha, OTR/L, CLT-LANA 02/16/24 9:55 AM'

## 2024-02-13 ENCOUNTER — Encounter: Payer: Self-pay | Admitting: Occupational Therapy

## 2024-02-13 ENCOUNTER — Other Ambulatory Visit: Payer: Self-pay | Admitting: Medical

## 2024-02-13 DIAGNOSIS — I10 Essential (primary) hypertension: Secondary | ICD-10-CM

## 2024-02-16 ENCOUNTER — Encounter: Payer: Self-pay | Admitting: Occupational Therapy

## 2024-02-17 ENCOUNTER — Ambulatory Visit: Admitting: Occupational Therapy

## 2024-02-17 ENCOUNTER — Encounter: Payer: Self-pay | Admitting: Occupational Therapy

## 2024-02-17 DIAGNOSIS — I890 Lymphedema, not elsewhere classified: Secondary | ICD-10-CM

## 2024-02-17 NOTE — Therapy (Signed)
 OUTPATIENT OCCUPATIONAL THERAPY EVALUATION  LOWER EXTREMITY LYMPHEDEMA  Patient Name: Natasha [name] MRN: [id number] DOB:02-18-1951, 73 y.o., female Today's Date: 02/17/2024  END OF SESSION:  .  OT End of Session - 02/17/24 1514     Visit Number 2    Number of Visits 36    Date for OT Re-Evaluation 05/13/24    OT Start Time 0310    OT Stop Time 0411    OT Time Calculation (min) 61 min    Activity Tolerance Patient tolerated treatment well;No increased pain            Past Medical History:  Diagnosis Date   Abdominal spasms 12/14/2020   Acute pulmonary embolism (HCC) 08/21/2019   Bacterial conjunctivitis of both eyes 12/14/2020   Bilateral leg edema 02/10/2020   Bradycardia    Breast cancer screening by mammogram 11/24/2020   CARCINOMA, THYROID  GLAND, PAPILLARY 05/04/2008   Stage 2, 8/09: thyroidectomy for 2.7cm papillary adenocarcinoma (t2 n0 mo) 9/09: I-131 rx, 108 mci 05/10: tg is neg (ab neg) , total body scan is neg   Colon cancer screening 11/24/2020   Colon polyps    Complication of anesthesia    trouble with airway   Cough 12/25/2007   Qualifier: Diagnosis of  By: Maryjane Snider LPN, Megan     [id number] 08/19/2019   Diverticulosis    DVT (deep venous thrombosis) (HCC)    Edema 12/22/2008   Encounter to establish care 11/24/2020   History of 2019 novel coronavirus disease (COVID-19) 11/09/2019   HYPERTENSION 12/25/2007   HYPOTHYROIDISM, POSTSURGICAL 05/04/2008   LEG PAIN, LEFT 12/09/2008   Lumbago with sciatica, right side 01/08/2021   Memory loss due to medical condition 11/09/2019   Muscle weakness    Nausea and vomiting 05/23/2008   Qualifier: Diagnosis of  By: Washington Hacker MD, Corbett Desanctis of this note might be different from the original. Qualifier: Diagnosis of  By: Washington Hacker MD, Sean A   Numbness and tingling    OSA (obstructive sleep apnea) 06/15/2013   CPAP   Paresthesia 01/04/2020   Peripheral edema 12/22/2008   Qualifier: Diagnosis of  By: Stann Earnest, MD, Lovena Rubinstein     Pneumonia due to COVID-19 virus    Pulmonary embolism (HCC)    Pulmonary embolism (HCC) 08/23/2019   Right lower quadrant pain 11/24/2020   Sciatica of left side 12/04/2011   Skin sensation disturbance 12/05/2008   Qualifier: Diagnosis of  By: Myrtice Athens MD, Florencia Hunter of this note might be different from the original. Qualifier: Diagnosis of  By: Myrtice Athens MD, Tarri Farm A   Spasm of muscle of lower back 12/14/2020   Vaginal candidiasis 03/16/2021   Weakness 01/04/2020   Past Surgical History:  Procedure Laterality Date   ABDOMINAL HYSTERECTOMY     due to bleeding, ovaries remain   ANKLE SURGERY Right    CERVICAL FUSION     x 2   COLONOSCOPY WITH PROPOFOL  N/A 08/08/2015   Procedure: COLONOSCOPY WITH PROPOFOL ;  Surgeon: Tami Falcon, MD;  Location: [facility];  Service: Endoscopy;  Laterality: N/A;   KNEE SURGERY Left    TOTAL THYROIDECTOMY     Patient Active Problem List   Diagnosis Date Noted   BMI 45.0-49.9, adult (HCC) 10/16/2023   Chronic venous insufficiency 09/10/2023   Mobility impaired 09/10/2023   Weakness of both lower extremities 09/10/2023   Gait instability 09/10/2023   Lymphedema 09/10/2023   Atrophic vaginitis 05/14/2023   Pelvic floor dysfunction 05/14/2023  Hypothyroidism 05/14/2023   Pancreatic insufficiency 05/14/2023   Arthritis of knee 03/24/2023   At risk for fall due to comorbid condition 03/10/2023   Non-recurrent acute suppurative otitis media of left ear without spontaneous rupture of tympanic membrane 02/17/2023   Mood changes 02/17/2023   Neck pain 01/31/2023   Decreased ROM of neck 01/31/2023   Upper back pain 01/31/2023   Acute pain of right shoulder 01/31/2023   Dysuria 01/31/2023   Recurrent urinary tract infection 01/31/2023   Urine frequency 01/31/2023   Walker as ambulation aid 01/31/2023   Ambulatory dysfunction 01/31/2023   Snoring 12/10/2022   Low TSH level 12/10/2022   Prediabetes 11/27/2022   Morbid obesity (HCC) 11/26/2022    BMI 50.0-59.9, adult (HCC) 11/26/2022   Elevated glucose 11/26/2022   B12 deficiency 11/26/2022   Health care maintenance 11/26/2022   SOB (shortness of breath) on exertion 11/26/2022   Other fatigue 11/26/2022   Dermatofibroma 10/11/2022   Candida infection 10/11/2022   Fatty liver 04/20/2021   Incontinence of urine in female 04/20/2021   Vitamin D  deficiency 03/16/2021   Hip pain 12/22/2020   Constipation 11/24/2020   Gastroesophageal reflux disease 11/24/2020   Groin pain, chronic, right 11/24/2020   Chronic right-sided low back pain with right-sided sciatica 11/24/2020   OAB (overactive bladder) 11/24/2020   Aortic atherosclerosis (HCC) 11/24/2020   Right-sided heart failure (HCC) 11/24/2020   History of pulmonary embolism 02/10/2020   Bilateral lower extremity edema 02/10/2020   Gait abnormality 01/04/2020   Muscle weakness (generalized) 11/09/2019   Pulmonary nodule 11/09/2019   Obesity, morbid, BMI 50 or higher (HCC) 08/24/2019   OSA (obstructive sleep apnea) 06/15/2013   Allergic rhinitis 01/21/2009   History of papillary adenocarcinoma of thyroid  05/04/2008   Hypothyroidism, postsurgical 05/04/2008   Essential hypertension 12/25/2007   Dyspnea 12/25/2007    PCP: Archibald Beard, NP  REFERRING PROVIDER: Archibald Beard, NP  REFERRING DIAG: I89.0  THERAPY DIAG:  Lymphedema, not elsewhere classified  Rationale for Evaluation and Treatment: Rehabilitation  ONSET DATE: >15 years  SUBJECTIVE:                                                                                                                                                                                          SUBJECTIVE STATEMENT: [name] presents to OT in manual transport wc for LE care. Pain is unchanged from initial eval. Pt is accompanied by her daughter and granddaughter. Pt has no new complaints.   PERTINENT HISTORY:  Hypothyroidism, postsurgical Essential hypertension Dyspnea OSA  (obstructive sleep apnea) Obesity, morbid, BMI 50 or higher (HCC) Muscle weakness (generalized) Gait abnormality History of pulmonary  embolism Bilateral lower extremity edema Chronic right-sided low back pain with right-sided sciatica Right-sided heart failure (HCC) Hip pain Incontinence of urine in female Dermatofibroma Morbid obesity (HCC) BMI 50.0-59.9, adult (HCC) SOB (shortness of breath) on exertion Other fatigue Prediabetes Decreased ROM of neck Upper back pain Acute pain of right shoulder Recurrent urinary tract infection Urine frequency Walker as ambulation aid Ambulatory dysfunction Mood changes At risk for fall due to comorbid condition Arthritis of knee Hypothyroidism Chronic venous insufficiency Mobility impaired Weakness of both lower extremities Gait instability Lymphedema BMI 45.0-49.9, adult (HCC)  PAIN:  Are you having pain? Yes, B knees: NPRS scale: 6/10 Pain location:  Pain description: creaky crackly, tight, heavy, sore, tender. With and without weight bearing Aggravating factors: standing, walking, weight bearing Relieving factors: walking  PRECAUTIONS: Other: LYMPHEDEMA PRECAUTIONS: prediabetic, hypothyroid,  RED FLAGS: Urinary incontinence; numbness and tingling in fingers, hands and toes   WEIGHT BEARING RESTRICTIONS: No  FALLS:  Has patient fallen in last 6 months? No  LIVING ENVIRONMENT: Lives with: lives with their daughter and  granddaughter Lives in: House/apartment Stairs: Yes; Internal: 14 steps; on right going up and External: garage 4 steps steps; can reach both Has following equipment at home: Walker - 4 wheeled, shower chair, and elevated toilet seat  OCCUPATION: retired Careers information officer professor  LEISURE: concerts, writing and producing plays  HAND DOMINANCE: right   PRIOR LEVEL OF FUNCTION: Independent  PATIENT GOALS: walk unassisted; lose weight   OBJECTIVE: Note: Objective measures were completed at Evaluation  unless otherwise noted.  COGNITION:  Overall cognitive status: Within functional limits for tasks assessed   OBSERVATIONS / OTHER ASSESSMENTS:   POSTURE: head forward, slight shoulder protraction  LE ROM: Limited at hips knees and ankles 2/2 body habitus and skin approximation  LE MMT: generalized weakness  LYMPHEDEMA ASSESSMENTS:   BLE COMPARATIVE LIMB VOLUMETRICS: Initial 02/17/24  LANDMARK RIGHT   R LEG (A-D) 6530.2 ml  R THIGH (E-G) ml  R FULL LIMB (A-G) ml  Limb Volume differential (LVD)  %  Volume change since initial %  Volume change overall V  (Blank rows = not tested)  LANDMARK LEFT  L LEG (A-D) 6601.0  L  THIGH (E-G) ml  L  FULL LIMB (A-G) ml  Limb Volume differential (LVD)  1.08% L>R  Volume change since initial %  Volume change overall %  (Blank rows = not tested)      SKIN/TISSUE INTEGRITY: : Moderate, Stage  II, Bilateral Lower Extremity Lymphedema 2/2 CVI,  Obesity, and Mm weakness  Skin  Description Hyper-Keratosis Peau d' Orange Shiny Tight Fibrotic/ Indurated Fatty Hard Spongy/ boggy   x   x R>L x x x   Skin dry Flaky WNL Macerated   mildly      Color Redness Varicosities Blanching Hemosiderin Stain Mottled        x   Odor Malodorous Yeast Fungal infection  WNL      x   Temperature Warm Cool wnl    x     Pitting Edema   1+ 2+ 3+ 4+ Non-pitting   x         Girth Symmetrical Asymmetrical                   Distribution    R>L toes to groin, bilaterally    Stemmer Sign Positive Negative   +    Lymphorrhea History Of:  Present Absent     x    Wounds History Of Present  Absent Venous Arterial Pressure Sheer   denies  x        Signs of Infection Redness Warmth Erythema Acute Swelling Drainage Borders                    Sensation Light Touch Deep pressure Hypersensitivity   In tact Impaired In tact Impaired Absent Impaired   x  x  x     Nails WNL   Fungus nail dystrophy   TBA     Hair Growth Symmetrical Asymmetrical    TB Ax    Skin Creases Base of toes  Ankles   Base of Fingers knees       Abdominal pannus Thigh Lobules  Face/neck   x x  x       GAIT: Pt transported to clinic in transport wheelchair Distance walked: Pt able to transfer out of wc and walk 4-5 steps using 2 wheeled walker to Rx bed with extra time (modified independent).  Assistive device utilized: Pt able to lift legs onto Rx bed, one at a time, using hands to actively assist Level of assistance: Modified independence Comments: Transfers  and bed mobility take an inordinate amount of time  LYMPHEDEMA LIFE IMPACT SCALE (LLIS): Initial: 86.76% (The extent to which LE-related problems impacted your life over the past week.)                                                                                                                           TREATMENT DATE: 02/12/24 OT evaluation Pt/ CG edu   PATIENT EDUCATION:  Discussed differential diagnoses for various swelling disorders. Provided basic level education regarding lymphatic structure and function, etiology, onset patterns, stages of progression, and prevention to limit infection risk, worsening condition and further functional decline. Pt edu for aught interaction between blood circulatory system and lymphatic circulation.Discussed  impact of gravity and co-morbidities on lymphatic function. Outlined Complete Decongestive Therapy (CDT)  as standard of care and provided in depth information regarding 4 primary components of Intensive and Self Management Phases, including Manual Lymph Drainage (MLD), compression wrapping and garments, skin care, and therapeutic exercise. Samuel Crock discussion with re need for frequent attendance and high burden of care when caregiver is needed, impact of co morbidities. We discussed  the chronic, progressive nature of lymphedema and Importance of daily, ongoing LE self-care essential for limiting progression and infection risk.  Person educated: Patient   Education method: Explanation, Demonstration, and Handouts Education comprehension: verbalized understanding, returned demonstration, verbal cues required, and needs further education  LYMPHEDEMA SELF-CARE HOME PROGRAM: BLE lymphatic pumping there ex- 1 set of 10 each element, in order. Hold 5. 2 x daily 2. Daily, short stretch, thigh length, multilayer compression bandages during Intensive Phase CDT 3. During self-Management Phase fit with appropriate compression garments and/ or devices 3. Daily skin care with low ph lotion matching skin ph 4. Daily simple self MLD   ASSESSMENT:  CLINICAL IMPRESSION: Pt able to transfer  from transport wc to Rx bed using inordinate amount of time. Pt used cane as leg lifter. Completed initial comparative limb volumetrics, which reveal essentially symmetrical limb volume measuring 1.08 %. Pt opts to treat LLE first. Provided Pt and family edu regarding outcome of volumetrics and implications for CDT. Unable to apply multilayer wraps today due to time constraints   (02/12/24 Initial OT Eval : RAAHI KORBER is a 73 yo female presenting with moderate, stage  II, bilateral lower extremity lymphedema 2/2 CVI,  obesity, and mm weakness. She will benefit from skilled Occupational Therapy to reduce limb swelling and associated pain, to limit progression and infection risk. BLE lymphedema limits functional performance in all occupational domains, including functional mobility and ambulation,  basic and instrumental ADLs, productive activities, leisure pursuits, social participation and quality of life. This patient will benefit from modified Intensive and Self Management Phase CDT . In addition to MLD, ther ex, skin care and compression bandaging below the knees, Pt will be fitted with custom knee length compression stockings paired with off the shelf , Capri length compression leggings. If insurance benefits allow, she may undergo a trial in the clinic with the advanced  Flexitouch device in an effort to reduce discomfort and reduce infection risk. Without skilled OT Li-lymphedema will worsen over time and further functional decline is expected.    Ms Beckles is unable to reach feet and distal legs to apply multilayer , gradient compression wraps during the Intensive Phase of CDT.  She understands this limitation and agrees to  arrange for a caregiver to assist her daily with compression wrapping between OT visits. With a caregiver to assist her with Intensive Phase compression her prognosis is fair. Without CG assistance her prognosis is poor.)  OBJECTIVE IMPAIRMENTS: decreased activity tolerance, decreased balance, decreased endurance, decreased knowledge of condition, decreased knowledge of use of DME, decreased mobility, difficulty walking, decreased ROM, decreased strength, increased edema, impaired UE functional use, postural dysfunction, obesity, pain, and chronic leg swelling.   ACTIVITY LIMITATIONS: ACTIVITY LIMITATIONS: Mobility and functional ambulation limitations:  abnormal gait pattern, difficulty walking, carrying, lifting, bending, sitting, standing, squatting, stairs, transfers, and bed mobility Basic and instrumental ADLs (reaching feet and distal legs to groom nails, inspect skin, apply lotion, bathe lower body, difficulty with LB dressing, including fitting LB clothing, shoes and socks, impaired sleeping, meal prep, standing to cook, driving, shopping, yard work, house work Paediatric nurse activities: work related activities requiring extended standing, walking, sitting; caring for others Social participation in the community, socializing with others, LE affects body image  Leisure pursuits requiring extended standing, walking, sitting  PERSONAL FACTORS: Fitness, Time since onset of injury/illness/exacerbation, and 3+ comorbidities: Obesity, arthritis, OSA are also affecting patient's functional outcome.   REHAB POTENTIAL: Fair with daily caregiver  assistance with gradient compression wrapping. Without assistance prognosis is poor as Pt is unable to apply wraps independently  EVALUATION COMPLEXITY: Moderate   GOALS: Goals reviewed with patient? Yes   SHORT TERM GOALS: Target date: 4th OT Rx visit  Pt will demonstrate understanding of lymphedema precautions and prevention strategies with modified independence using a printed reference to identify at least 5 precautions and discussing how s/he may implement them into daily life to reduce risk of progression with modified assistance Baseline: max a Goal status: INITIAL   2.  With Max caregiver assistance Pt will be able to apply multilayer, knee length, compression wraps using gradient techniques to decrease limb volume, to limit infection risk, and to limit lymphedema progression.  Given this patient's Intake score of tbd % on the Lymphedema Life Impact Scale (LLIS), patient will experience a reduction of at least 5% in her perceived level of functional impairment resulting from lymphedema to improve functional performance and quality of life (QOL). Baseline: Dependent Goal status: INITIAL     LONG TERM GOALS: Target date: 05/13/24 Given this patient's Intake score of 86.76 % on the Lymphedema Life Impact Scale (LLIS), patient will experience a reduction of at least 10% in her perceived level of functional impairment resulting from lymphedema to improve functional performance and quality of life (QOL).Baseline: tbd Baseline: max a Goal status: INITIAL     2.  With modified independence (extra time and assistive devices) Pt will be able to don and doff appropriate compression garments and/or devices to control BLE lymphedema and to limit progression.  Baseline: Dependent Goal status: INITIAL   3. Pt will achieve at least a 10% volume reductions bilaterally below the knees to return limb to more typical size and shape, to limit infection risk and LE progression, to decrease pain, to  improve function, and to improve body image and QOL. Baseline: Dependent Goal status: INITIAL   4. Pt will achieve and sustain at least 85% compliance with all LE self-care home program components throughout CDT, including modified simple self-MLD, daily skin care and inspection, lymphatic pumping the ex and appropriate compression to limit lymphedema progression and to limit further functional decline. Baseline: Dependent. (Note: W/out daily assistance with donning/ doffing compression  , prognosis is poor.) Goal status: INITIAL   PLAN:  OT FREQUENCY: 2x/week and PRN  OT DURATION: other: 18 weeks and PRN  PLANNED INTERVENTIONS:  Complete Decongestive Therapy (CDT): Manual Lymphatic Drainage (MLD) , Skin care, ther ex, gradient compression 97110-Therapeutic exercises, 97530- Therapeutic activity, 97535- Self Care, 16109- Manual therapy, Patient/Family education, Manual lymph drainage, Compression bandaging, DME instructions, and trial with advanced, sequential, pneumatic, compression device (Tactile Medical Flexitouch) Pt will arrange for a caregiver to assist her daily with compression wrapping between OT visits.  Custom-made gradient compression garments and HOS devices are medically necessary because they are uniquely sized and shaped to fit the exact dimensions of the affected extremities, and to provide appropriate medical grade, graduated compression essential for optimally managing chronic, progressive lymphedema. Multiple custom compression garments are needed to ensure proper hygiene to limit infection risk. Custom compression garments should be replaced q 3-6 months When worn consistently for optimal lipo-lymphedema self-management over time. HOS devices, medically necessary to limit fibrosis buildup in tissue, should be replaced q 2 years and PRN when worn out.   PLAN FOR NEXT SESSION:  Pt/ caregiver edu Knee length, Multilayer compression wraps to one leg only-Pt's  choice.  Arnold Bicker, MS, OTR/L, CLT-LANA 02/17/24 4:12 PM'

## 2024-02-18 DIAGNOSIS — H353221 Exudative age-related macular degeneration, left eye, with active choroidal neovascularization: Secondary | ICD-10-CM | POA: Diagnosis not present

## 2024-02-18 DIAGNOSIS — H40013 Open angle with borderline findings, low risk, bilateral: Secondary | ICD-10-CM | POA: Diagnosis not present

## 2024-02-18 DIAGNOSIS — H35373 Puckering of macula, bilateral: Secondary | ICD-10-CM | POA: Diagnosis not present

## 2024-02-18 DIAGNOSIS — H353111 Nonexudative age-related macular degeneration, right eye, early dry stage: Secondary | ICD-10-CM | POA: Diagnosis not present

## 2024-02-23 ENCOUNTER — Ambulatory Visit: Admitting: Occupational Therapy

## 2024-02-23 DIAGNOSIS — I890 Lymphedema, not elsewhere classified: Secondary | ICD-10-CM

## 2024-02-23 NOTE — Therapy (Signed)
 OUTPATIENT OCCUPATIONAL THERAPY TREATMENT NOTE  LOWER EXTREMITY LYMPHEDEMA  Patient Name: Natasha [name] MRN: [id number] DOB:22-Apr-1951, 73 y.o., female Today's Date: 02/23/2024  END OF SESSION:  .  OT End of Session - 02/23/24 1009     Visit Number 3    Number of Visits 36    Date for OT Re-Evaluation 05/13/24    OT Start Time 0910    OT Stop Time 1010    OT Time Calculation (min) 60 min    Activity Tolerance Patient tolerated treatment well;No increased pain            Past Medical History:  Diagnosis Date   Abdominal spasms 12/14/2020   Acute pulmonary embolism (HCC) 08/21/2019   Bacterial conjunctivitis of both eyes 12/14/2020   Bilateral leg edema 02/10/2020   Bradycardia    Breast cancer screening by mammogram 11/24/2020   CARCINOMA, THYROID  GLAND, PAPILLARY 05/04/2008   Stage 2, 8/09: thyroidectomy for 2.7cm papillary adenocarcinoma (t2 n0 mo) 9/09: I-131 rx, 108 [facility]ci 05/10: tg is neg (ab neg) , total body scan is neg   Colon cancer screening 11/24/2020   Colon polyps    Complication of anesthesia    trouble with airway   Cough 12/25/2007   Qualifier: Diagnosis of  By: Germaine LPN, Natasha [name]     RNCPI-80 08/19/2019   Diverticulosis    DVT (deep venous thrombosis) (HCC)    Edema 12/22/2008   Encounter to establish care 11/24/2020   History of 2019 novel coronavirus disease (COVID-19) 11/09/2019   HYPERTENSION 12/25/2007   HYPOTHYROIDISM, POSTSURGICAL 05/04/2008   LEG PAIN, LEFT 12/09/2008   Lumbago with sciatica, right side 01/08/2021   Memory loss due to medical condition 11/09/2019   Muscle weakness    Nausea and vomiting 05/23/2008   Qualifier: Diagnosis of  By: Kassie MD, Natasha [name] Natasha [name] Deal of this note might be different from the original. Qualifier: Diagnosis of  By: Kassie MD, Natasha [name]   Numbness and tingling    OSA (obstructive sleep apnea) 06/15/2013   CPAP   Paresthesia 01/04/2020   Peripheral edema 12/22/2008   Qualifier: Diagnosis of  By: Delford, MD, Natasha [name]    Pneumonia due to COVID-19 virus    Pulmonary embolism (HCC)    Pulmonary embolism (HCC) 08/23/2019   Right lower quadrant pain 11/24/2020   Sciatica of left side 12/04/2011   Skin sensation disturbance 12/05/2008   Qualifier: Diagnosis of  By: Inocencio MD, Natasha [name] Deal of this note might be different from the original. Qualifier: Diagnosis of  By: Inocencio MD, Natasha [name]   Spasm of muscle of lower back 12/14/2020   Vaginal candidiasis 03/16/2021   Weakness 01/04/2020   Past Surgical History:  Procedure Laterality Date   ABDOMINAL HYSTERECTOMY     due to bleeding, ovaries remain   ANKLE SURGERY Right    CERVICAL FUSION     x 2   COLONOSCOPY WITH PROPOFOL  N/[name] 08/08/2015   Procedure: COLONOSCOPY WITH PROPOFOL ;  Surgeon: Renaye Sous, MD;  Location: [facility];  Service: Endoscopy;  Laterality: N/[name];   KNEE SURGERY Left    TOTAL THYROIDECTOMY     Patient Active Problem List   Diagnosis Date Noted   BMI 45.0-49.9, adult (HCC) 10/16/2023   Chronic venous insufficiency 09/10/2023   Mobility impaired 09/10/2023   Weakness of both lower extremities 09/10/2023   Gait instability 09/10/2023   Lymphedema 09/10/2023   Atrophic vaginitis 05/14/2023   Pelvic floor dysfunction 05/14/2023  Hypothyroidism 05/14/2023   Pancreatic insufficiency 05/14/2023   Arthritis of knee 03/24/2023   At risk for fall due to comorbid condition 03/10/2023   Non-recurrent acute suppurative otitis media of left ear without spontaneous rupture of tympanic membrane 02/17/2023   Mood changes 02/17/2023   Neck pain 01/31/2023   Decreased ROM of neck 01/31/2023   Upper back pain 01/31/2023   Acute pain of right shoulder 01/31/2023   Dysuria 01/31/2023   Recurrent urinary tract infection 01/31/2023   Urine frequency 01/31/2023   Walker as ambulation aid 01/31/2023   Ambulatory dysfunction 01/31/2023   Snoring 12/10/2022   Low TSH level 12/10/2022   Prediabetes 11/27/2022   Morbid obesity (HCC)  11/26/2022   BMI 50.0-59.9, adult (HCC) 11/26/2022   Elevated glucose 11/26/2022   B12 deficiency 11/26/2022   Health care maintenance 11/26/2022   SOB (shortness of breath) on exertion 11/26/2022   Other fatigue 11/26/2022   Dermatofibroma 10/11/2022   Candida infection 10/11/2022   Fatty liver 04/20/2021   Incontinence of urine in female 04/20/2021   Vitamin D  deficiency 03/16/2021   Hip pain 12/22/2020   Constipation 11/24/2020   Gastroesophageal reflux disease 11/24/2020   Groin pain, chronic, right 11/24/2020   Chronic right-sided low back pain with right-sided sciatica 11/24/2020   OAB (overactive bladder) 11/24/2020   Aortic atherosclerosis (HCC) 11/24/2020   Right-sided heart failure (HCC) 11/24/2020   History of pulmonary embolism 02/10/2020   Bilateral lower extremity edema 02/10/2020   Gait abnormality 01/04/2020   Muscle weakness (generalized) 11/09/2019   Pulmonary nodule 11/09/2019   Obesity, morbid, BMI 50 or higher (HCC) 08/24/2019   OSA (obstructive sleep apnea) 06/15/2013   Allergic rhinitis 01/21/2009   History of papillary adenocarcinoma of thyroid  05/04/2008   Hypothyroidism, postsurgical 05/04/2008   Essential hypertension 12/25/2007   Dyspnea 12/25/2007    PCP: Camie Doing, NP  REFERRING PROVIDER: Camie Doing, NP  REFERRING DIAG: I89.0  THERAPY DIAG:  Lymphedema, not elsewhere classified  Rationale for Evaluation and Treatment: Rehabilitation  ONSET DATE: >15 years  SUBJECTIVE:                                                                                                                                                                                          SUBJECTIVE STATEMENT: [name] presents to OT in manual transport wc for LE care. Pain is unchanged from initial eval. Pt is accompanied by her daughter and granddaughter. Pt has no new complaints. Pt is mildly SOB after transfer from [facility] to Rx ben for 15 minutes. Pt performs wc transfers  with extra time. Pt needs assistance lifting legs onto Rx bed  today. Pt able to us  cane with cuing.  PERTINENT HISTORY:  Hypothyroidism, postsurgical Essential hypertension Dyspnea OSA (obstructive sleep apnea) Obesity, morbid, BMI 50 or higher (HCC) Muscle weakness (generalized) Gait abnormality History of pulmonary embolism Bilateral lower extremity edema Chronic right-sided low back pain with right-sided sciatica Right-sided heart failure (HCC) Hip pain Incontinence of urine in female Dermatofibroma Morbid obesity (HCC) BMI 50.0-59.9, adult (HCC) SOB (shortness of breath) on exertion Other fatigue Prediabetes Decreased ROM of neck Upper back pain Acute pain of right shoulder Recurrent urinary tract infection Urine frequency Walker as ambulation aid Ambulatory dysfunction Mood changes At risk for fall due to comorbid condition Arthritis of knee Hypothyroidism Chronic venous insufficiency Mobility impaired Weakness of both lower extremities Gait instability Lymphedema BMI 45.0-49.9, adult (HCC)  PAIN:  Are you having pain? Yes, B knees: NPRS scale: 6/10 Pain location:  Pain description: creaky crackly, tight, heavy, sore, tender. With and without weight bearing Aggravating factors: standing, walking, weight bearing Relieving factors: walking  PRECAUTIONS: Other: LYMPHEDEMA PRECAUTIONS: prediabetic, hypothyroid,  RED FLAGS: Urinary incontinence; numbness and tingling in fingers, hands and toes   WEIGHT BEARING RESTRICTIONS: No  FALLS:  Has patient fallen in last 6 months? No  LIVING ENVIRONMENT: Lives with: lives with their daughter and  granddaughter Lives in: House/apartment Stairs: Yes; Internal: 14 steps; on right going up and External: garage 4 steps steps; can reach both Has following equipment at home: Walker - 4 wheeled, shower chair, and elevated toilet seat  OCCUPATION: retired Careers information officer professor  LEISURE: concerts, writing and producing  plays  HAND DOMINANCE: right   PRIOR LEVEL OF FUNCTION: Independent  PATIENT GOALS: walk unassisted; lose weight   OBJECTIVE: Note: Objective measures were completed at Evaluation unless otherwise noted.  COGNITION:  Overall cognitive status: Within functional limits for tasks assessed   OBSERVATIONS / OTHER ASSESSMENTS:   POSTURE: head forward, slight shoulder protraction  LE ROM: Limited at hips knees and ankles 2/2 body habitus and skin approximation  LE MMT: generalized weakness  LYMPHEDEMA ASSESSMENTS:   BLE COMPARATIVE LIMB VOLUMETRICS: Initial 02/17/24  LANDMARK RIGHT   R LEG ([name]-D) 6530.2 ml  R THIGH (E-G) ml  R FULL LIMB ([name]-G) ml  Limb Volume differential (LVD)  %  Volume change since initial %  Volume change overall V  (Blank rows = not tested)  LANDMARK LEFT  L LEG ([name]-D) 6601.0  L  THIGH (E-G) ml  L  FULL LIMB ([name]-G) ml  Limb Volume differential (LVD)  1.08% L>R  Volume change since initial %  Volume change overall %  (Blank rows = not tested)      SKIN/TISSUE INTEGRITY: : Moderate, Stage  II, Bilateral Lower Extremity Lymphedema 2/2 CVI,  Obesity, and Mm weakness  Skin  Description Hyper-Keratosis Peau d' Orange Shiny Tight Fibrotic/ Indurated Fatty Hard Spongy/ boggy   x   x R>L x x x   Skin dry Flaky WNL Macerated   mildly      Color Redness Varicosities Blanching Hemosiderin Stain Mottled        x   Odor Malodorous Yeast Fungal infection  WNL      x   Temperature Warm Cool wnl    x     Pitting Edema   1+ 2+ 3+ 4+ Non-pitting   x         Girth Symmetrical Asymmetrical                   Distribution  R>L toes to groin, bilaterally    Stemmer Sign Positive Negative   +    Lymphorrhea History Of:  Present Absent     x    Wounds History Of Present Absent Venous Arterial Pressure Sheer   denies  x        Signs of Infection Redness Warmth Erythema Acute Swelling Drainage Borders                    Sensation Light  Touch Deep pressure Hypersensitivity   In tact Impaired In tact Impaired Absent Impaired   x  x  x     Nails WNL   Fungus nail dystrophy   TBA     Hair Growth Symmetrical Asymmetrical   TB Ax    Skin Creases Base of toes  Ankles   Base of Fingers knees       Abdominal pannus Thigh Lobules  Face/neck   x x  x       GAIT: Pt transported to clinic in transport wheelchair Distance walked: Pt able to transfer out of wc and walk 4-5 steps using 2 wheeled walker to Rx bed with extra time (modified independent).  Assistive device utilized: Pt able to lift legs onto Rx bed, one at [name] time, using hands to actively assist Level of assistance: Modified independence Comments: Transfers  and bed mobility take an inordinate amount of time  LYMPHEDEMA LIFE IMPACT SCALE (LLIS): Initial: 86.76% (The extent to which LE-related problems impacted your life over the past week.)                                                                                                                           TREATMENT DATE: 02/12/24 OT evaluation Pt/ CG edu   PATIENT EDUCATION:  Continued Pt/ CG edu for lymphedema self care home program throughout session. Topics include outcome of comparative limb volumetrics- starting limb volume differentials (LVDs), technology and gradient techniques used for short stretch, multilayer compression wrapping, simple self-MLD, therapeutic lymphatic pumping exercises, skin/nail care, LE precautions, compression garment recommendations and specifications, wear and care schedule and compression garment donning / doffing w assistive devices. Discussed progress towards all OT goals since commencing CDT. Discussed detrimental impact of obesity on lower and upper extremity lymphedema over time. Reviewed OT goals for lymphedema care with Pt and discussed progress to date.  All questions answered to the Pt's satisfaction. Good return. Person educated: Patient and family Education method:  Explanation, Demonstration, and Handouts Education comprehension: verbalized understanding, returned demonstration, verbal cues required, and needs further education  LYMPHEDEMA SELF-CARE HOME PROGRAM: BLE lymphatic pumping there ex- 1 set of 10 each element, in order. Hold 5. 2 x daily 2. Daily, short stretch, thigh length, multilayer compression bandages during Intensive Phase CDT 3. During self-Management Phase fit with appropriate compression garments and/ or devices 3. Daily skin care with low ph lotion matching skin ph 4. Daily simple self MLD   ASSESSMENT:  CLINICAL IMPRESSION: Pt able to transfer from transport wc to Rx with extra time (mod independent). She is able to left legs onto Rx bed with extra time and using case handle to lift legs with verbal cues (min [name]). Pt used cane as leg lifter. Commenced RLE, knee length, multilayer compression wrapping using short stretch bandages over Rosidal foam. Educated Pt and caregivers ongoing throughout session re why we apply wraps , how we wrap and how this technology works to reduce lymphedema without impairing blood circulation. Demonstrated wrap techniques while caregiver made [name] video for reference at home. Pt is unable to assist with wrapping tasks even those within reach due to heaviness of legs. Pt verbalized  understanding of compression precautions, particularly instructions to remove wraps if she experiences any atypical SOP or pain.  Finally , pt and family educated to perform lymphatic pumping ther ex. After skilled teaching Pt able to perform all while long sitting on Rx bed. Cont as per POC.   (02/12/24 Initial OT Eval : HANNAN TETZLAFF is [name] 73 yo female presenting with moderate, stage  II, bilateral lower extremity lymphedema 2/2 CVI,  obesity, and mm weakness. She will benefit from skilled Occupational Therapy to reduce limb swelling and associated pain, to limit progression and infection risk. BLE lymphedema limits functional  performance in all occupational domains, including functional mobility and ambulation,  basic and instrumental ADLs, productive activities, leisure pursuits, social participation and quality of life. This patient will benefit from modified Intensive and Self Management Phase CDT . In addition to MLD, ther ex, skin care and compression bandaging below the knees, Pt will be fitted with custom knee length compression stockings paired with off the shelf , Capri length compression leggings. If insurance benefits allow, she may undergo [name] trial in the clinic with the advanced Flexitouch device in an effort to reduce discomfort and reduce infection risk. Without skilled OT Li-lymphedema will worsen over time and further functional decline is expected.    Ms Graham is unable to reach feet and distal legs to apply multilayer , gradient compression wraps during the Intensive Phase of CDT.  She understands this limitation and agrees to  arrange for [name] caregiver to assist her daily with compression wrapping between OT visits. With [name] caregiver to assist her with Intensive Phase compression her prognosis is fair. Without CG assistance her prognosis is poor.)  OBJECTIVE IMPAIRMENTS: decreased activity tolerance, decreased balance, decreased endurance, decreased knowledge of condition, decreased knowledge of use of DME, decreased mobility, difficulty walking, decreased ROM, decreased strength, increased edema, impaired UE functional use, postural dysfunction, obesity, pain, and chronic leg swelling.   ACTIVITY LIMITATIONS: ACTIVITY LIMITATIONS: Mobility and functional ambulation limitations:  abnormal gait pattern, difficulty walking, carrying, lifting, bending, sitting, standing, squatting, stairs, transfers, and bed mobility Basic and instrumental ADLs (reaching feet and distal legs to groom nails, inspect skin, apply lotion, bathe lower body, difficulty with LB dressing, including fitting LB clothing, shoes and socks,  impaired sleeping, meal prep, standing to cook, driving, shopping, yard work, house work Paediatric nurse activities: work related activities requiring extended standing, walking, sitting; caring for others Social participation in the community, socializing with others, LE affects body image  Leisure pursuits requiring extended standing, walking, sitting  PERSONAL FACTORS: Fitness, Time since onset of injury/illness/exacerbation, and 3+ comorbidities: Obesity, arthritis, OSA are also affecting patient's functional outcome.   REHAB POTENTIAL: Fair with daily caregiver assistance with gradient compression wrapping. Without assistance prognosis is poor as Pt is unable to apply  wraps independently  EVALUATION COMPLEXITY: Moderate   GOALS: Goals reviewed with patient? Yes   SHORT TERM GOALS: Target date: 4th OT Rx visit  Pt will demonstrate understanding of lymphedema precautions and prevention strategies with modified independence using [name] printed reference to identify at least 5 precautions and discussing how s/he may implement them into daily life to reduce risk of progression with modified assistance Baseline: max [name] Goal status: INITIAL   2.  With Max caregiver assistance Pt will be able to apply multilayer, knee length, compression wraps using gradient techniques to decrease limb volume, to limit infection risk, and to limit lymphedema progression.  Given this patient's Intake score of tbd % on the Lymphedema Life Impact Scale (LLIS), patient will experience [name] reduction of at least 5% in her perceived level of functional impairment resulting from lymphedema to improve functional performance and quality of life (QOL). Baseline: Dependent Goal status: INITIAL     LONG TERM GOALS: Target date: 05/13/24 Given this patient's Intake score of 86.76 % on the Lymphedema Life Impact Scale (LLIS), patient will experience [name] reduction of at least 10% in her perceived level of functional impairment resulting  from lymphedema to improve functional performance and quality of life (QOL).Baseline: tbd Baseline: max [name] Goal status: INITIAL     2.  With modified independence (extra time and assistive devices) Pt will be able to don and doff appropriate compression garments and/or devices to control BLE lymphedema and to limit progression.  Baseline: Dependent Goal status: INITIAL   3. Pt will achieve at least [name] 10% volume reductions bilaterally below the knees to return limb to more typical size and shape, to limit infection risk and LE progression, to decrease pain, to improve function, and to improve body image and QOL. Baseline: Dependent Goal status: INITIAL   4. Pt will achieve and sustain at least 85% compliance with all LE self-care home program components throughout CDT, including modified simple self-MLD, daily skin care and inspection, lymphatic pumping the ex and appropriate compression to limit lymphedema progression and to limit further functional decline. Baseline: Dependent. (Note: W/out daily assistance with donning/ doffing compression  , prognosis is poor.) Goal status: INITIAL   PLAN:  OT FREQUENCY: 2x/week and PRN  OT DURATION: other: 18 weeks and PRN  PLANNED INTERVENTIONS:  Complete Decongestive Therapy (CDT): Manual Lymphatic Drainage (MLD) , Skin care, ther ex, gradient compression 97110-Therapeutic exercises, 97530- Therapeutic activity, 97535- Self Care, [id number]- Manual therapy, Patient/Family education, Manual lymph drainage, Compression bandaging, DME instructions, and trial with advanced, sequential, pneumatic, compression device (Tactile Medical Flexitouch) Pt will arrange for [name] caregiver to assist her daily with compression wrapping between OT visits.  Custom-made gradient compression garments and HOS devices are medically necessary because they are uniquely sized and shaped to fit the exact dimensions of the affected extremities, and to provide appropriate medical grade,  graduated compression essential for optimally managing chronic, progressive lymphedema. Multiple custom compression garments are needed to ensure proper hygiene to limit infection risk. Custom compression garments should be replaced q 3-6 months When worn consistently for optimal lipo-lymphedema self-management over time. HOS devices, medically necessary to limit fibrosis buildup in tissue, should be replaced q 2 years and PRN when worn out.   PLAN FOR NEXT SESSION:  Pt/ caregiver edu Knee length, Multilayer compression wraps to one leg only-Pt's choice.  Zebedee Dec, MS, OTR/L, CLT-LANA 02/23/24 12:17 PM'

## 2024-02-24 DIAGNOSIS — H353132 Nonexudative age-related macular degeneration, bilateral, intermediate dry stage: Secondary | ICD-10-CM | POA: Diagnosis not present

## 2024-02-24 DIAGNOSIS — H43813 Vitreous degeneration, bilateral: Secondary | ICD-10-CM | POA: Diagnosis not present

## 2024-02-24 DIAGNOSIS — H35373 Puckering of macula, bilateral: Secondary | ICD-10-CM | POA: Diagnosis not present

## 2024-02-25 ENCOUNTER — Ambulatory Visit: Admitting: Occupational Therapy

## 2024-02-27 ENCOUNTER — Ambulatory Visit: Admitting: Occupational Therapy

## 2024-02-27 ENCOUNTER — Encounter: Payer: Self-pay | Admitting: Occupational Therapy

## 2024-02-27 DIAGNOSIS — I890 Lymphedema, not elsewhere classified: Secondary | ICD-10-CM | POA: Diagnosis not present

## 2024-02-27 NOTE — Therapy (Signed)
 OUTPATIENT OCCUPATIONAL THERAPY TREATMENT NOTE  LOWER EXTREMITY LYMPHEDEMA  Patient Name: Natasha [name] MRN: [id number] DOB:1951/07/10, 73 y.o., female Today's Date: 02/27/2024  END OF SESSION:  .  OT End of Session - 02/27/24 0923     Visit Number 4    Number of Visits 36    Date for OT Re-Evaluation 05/13/24    OT Start Time 0903    OT Stop Time 1010    OT Time Calculation (min) 67 min    Activity Tolerance Patient tolerated treatment well;No increased pain    Behavior During Therapy [facility] for tasks assessed/performed            Past Medical History:  Diagnosis Date   Abdominal spasms 12/14/2020   Acute pulmonary embolism (HCC) 08/21/2019   Bacterial conjunctivitis of both eyes 12/14/2020   Bilateral leg edema 02/10/2020   Bradycardia    Breast cancer screening by mammogram 11/24/2020   CARCINOMA, THYROID  GLAND, PAPILLARY 05/04/2008   Stage 2, 8/09: thyroidectomy for 2.7cm papillary adenocarcinoma (t2 n0 mo) 9/09: I-131 rx, 108 mci 05/10: tg is neg (ab neg) , total body scan is neg   Colon cancer screening 11/24/2020   Colon polyps    Complication of anesthesia    trouble with airway   Cough 12/25/2007   Qualifier: Diagnosis of  By: Germaine LPN, Megan     RNCPI-80 08/19/2019   Diverticulosis    DVT (deep venous thrombosis) (HCC)    Edema 12/22/2008   Encounter to establish care 11/24/2020   History of 2019 novel coronavirus disease (COVID-19) 11/09/2019   HYPERTENSION 12/25/2007   HYPOTHYROIDISM, POSTSURGICAL 05/04/2008   LEG PAIN, LEFT 12/09/2008   Lumbago with sciatica, right side 01/08/2021   Memory loss due to medical condition 11/09/2019   Muscle weakness    Nausea and vomiting 05/23/2008   Qualifier: Diagnosis of  By: Kassie MD, Alyce LABOR   Formatting of this note might be different from the original. Qualifier: Diagnosis of  By: Kassie MD, Sean A   Numbness and tingling    OSA (obstructive sleep apnea) 06/15/2013   CPAP   Paresthesia 01/04/2020   Peripheral edema 12/22/2008    Qualifier: Diagnosis of  By: Delford, MD, CODY Maude Dunnings    Pneumonia due to COVID-19 virus    Pulmonary embolism (HCC)    Pulmonary embolism (HCC) 08/23/2019   Right lower quadrant pain 11/24/2020   Sciatica of left side 12/04/2011   Skin sensation disturbance 12/05/2008   Qualifier: Diagnosis of  By: Inocencio MD, Berwyn LABOR Deal of this note might be different from the original. Qualifier: Diagnosis of  By: Inocencio MD, Berwyn A   Spasm of muscle of lower back 12/14/2020   Vaginal candidiasis 03/16/2021   Weakness 01/04/2020   Past Surgical History:  Procedure Laterality Date   ABDOMINAL HYSTERECTOMY     due to bleeding, ovaries remain   ANKLE SURGERY Right    CERVICAL FUSION     x 2   COLONOSCOPY WITH PROPOFOL  N/A 08/08/2015   Procedure: COLONOSCOPY WITH PROPOFOL ;  Surgeon: Renaye Sous, MD;  Location: [facility];  Service: Endoscopy;  Laterality: N/A;   KNEE SURGERY Left    TOTAL THYROIDECTOMY     Patient Active Problem List   Diagnosis Date Noted   BMI 45.0-49.9, adult (HCC) 10/16/2023   Chronic venous insufficiency 09/10/2023   Mobility impaired 09/10/2023   Weakness of both lower extremities 09/10/2023   Gait instability 09/10/2023   Lymphedema 09/10/2023  Atrophic vaginitis 05/14/2023   Pelvic floor dysfunction 05/14/2023   Hypothyroidism 05/14/2023   Pancreatic insufficiency 05/14/2023   Arthritis of knee 03/24/2023   At risk for fall due to comorbid condition 03/10/2023   Non-recurrent acute suppurative otitis media of left ear without spontaneous rupture of tympanic membrane 02/17/2023   Mood changes 02/17/2023   Neck pain 01/31/2023   Decreased ROM of neck 01/31/2023   Upper back pain 01/31/2023   Acute pain of right shoulder 01/31/2023   Dysuria 01/31/2023   Recurrent urinary tract infection 01/31/2023   Urine frequency 01/31/2023   Walker as ambulation aid 01/31/2023   Ambulatory dysfunction 01/31/2023   Snoring 12/10/2022   Low TSH level  12/10/2022   Prediabetes 11/27/2022   Morbid obesity (HCC) 11/26/2022   BMI 50.0-59.9, adult (HCC) 11/26/2022   Elevated glucose 11/26/2022   B12 deficiency 11/26/2022   Health care maintenance 11/26/2022   SOB (shortness of breath) on exertion 11/26/2022   Other fatigue 11/26/2022   Dermatofibroma 10/11/2022   Candida infection 10/11/2022   Fatty liver 04/20/2021   Incontinence of urine in female 04/20/2021   Vitamin D  deficiency 03/16/2021   Hip pain 12/22/2020   Constipation 11/24/2020   Gastroesophageal reflux disease 11/24/2020   Groin pain, chronic, right 11/24/2020   Chronic right-sided low back pain with right-sided sciatica 11/24/2020   OAB (overactive bladder) 11/24/2020   Aortic atherosclerosis (HCC) 11/24/2020   Right-sided heart failure (HCC) 11/24/2020   History of pulmonary embolism 02/10/2020   Bilateral lower extremity edema 02/10/2020   Gait abnormality 01/04/2020   Muscle weakness (generalized) 11/09/2019   Pulmonary nodule 11/09/2019   Obesity, morbid, BMI 50 or higher (HCC) 08/24/2019   OSA (obstructive sleep apnea) 06/15/2013   Allergic rhinitis 01/21/2009   History of papillary adenocarcinoma of thyroid  05/04/2008   Hypothyroidism, postsurgical 05/04/2008   Essential hypertension 12/25/2007   Dyspnea 12/25/2007    PCP: Camie Doing, NP  REFERRING PROVIDER: Camie Doing, NP  REFERRING DIAG: I89.0  THERAPY DIAG:  Lymphedema, not elsewhere classified  Rationale for Evaluation and Treatment: Rehabilitation  ONSET DATE: >15 years  SUBJECTIVE:                                                                                                                                                                                          SUBJECTIVE STATEMENT: Ms. Brierley presents to OT in manual transport wc for LE care. Pain is unchanged from initial eval. Pt is accompanied by her daughter and granddaughter. Pt has no new complaints. Pt performs wc transfers with  extra time. Pt reports R leg swelling is much improved since last visit.  Pt reports her adult daughter helped her wrap and it went well, but her daughter is unable to assist her daily. We discussed some local resources for adults needing assistance with some self care at home. Pt will research further through her local department on aging.  PERTINENT HISTORY:  Hypothyroidism, postsurgical Essential hypertension Dyspnea OSA (obstructive sleep apnea) Obesity, morbid, BMI 50 or higher (HCC) Muscle weakness (generalized) Gait abnormality History of pulmonary embolism Bilateral lower extremity edema Chronic right-sided low back pain with right-sided sciatica Right-sided heart failure (HCC) Hip pain Incontinence of urine in female Dermatofibroma Morbid obesity (HCC) BMI 50.0-59.9, adult (HCC) SOB (shortness of breath) on exertion Other fatigue Prediabetes Decreased ROM of neck Upper back pain Acute pain of right shoulder Recurrent urinary tract infection Urine frequency Walker as ambulation aid Ambulatory dysfunction Mood changes At risk for fall due to comorbid condition Arthritis of knee Hypothyroidism Chronic venous insufficiency Mobility impaired Weakness of both lower extremities Gait instability Lymphedema BMI 45.0-49.9, adult (HCC)  PAIN:  Are you having pain? Yes, B knees: NPRS scale: 6/10 Pain location:  Pain description: creaky crackly, tight, heavy, sore, tender. With and without weight bearing Aggravating factors: standing, walking, weight bearing Relieving factors: walking  PRECAUTIONS: Other: LYMPHEDEMA PRECAUTIONS: prediabetic, hypothyroid,  RED FLAGS: Urinary incontinence; numbness and tingling in fingers, hands and toes   WEIGHT BEARING RESTRICTIONS: No  FALLS:  Has patient fallen in last 6 months? No  LIVING ENVIRONMENT: Lives with: lives with their daughter and  granddaughter Lives in: House/apartment Stairs: Yes; Internal: 14 steps; on right  going up and External: garage 4 steps steps; can reach both Has following equipment at home: Walker - 4 wheeled, shower chair, and elevated toilet seat  OCCUPATION: retired Careers information officer professor  LEISURE: concerts, writing and producing plays  HAND DOMINANCE: right   PRIOR LEVEL OF FUNCTION: Independent  PATIENT GOALS: walk unassisted; lose weight   OBJECTIVE: Note: Objective measures were completed at Evaluation unless otherwise noted.  COGNITION:  Overall cognitive status: Within functional limits for tasks assessed   OBSERVATIONS / OTHER ASSESSMENTS:   POSTURE: head forward, slight shoulder protraction  LE ROM: Limited at hips knees and ankles 2/2 body habitus and skin approximation  LE MMT: generalized weakness  LYMPHEDEMA ASSESSMENTS:   BLE COMPARATIVE LIMB VOLUMETRICS: Initial 02/17/24  LANDMARK RIGHT   R LEG (A-D) 6530.2 ml  R THIGH (E-G) ml  R FULL LIMB (A-G) ml  Limb Volume differential (LVD)  %  Volume change since initial %  Volume change overall V  (Blank rows = not tested)  LANDMARK LEFT  L LEG (A-D) 6601.0  L  THIGH (E-G) ml  L  FULL LIMB (A-G) ml  Limb Volume differential (LVD)  1.08% L>R  Volume change since initial %  Volume change overall %  (Blank rows = not tested)      SKIN/TISSUE INTEGRITY: : Moderate, Stage  II, Bilateral Lower Extremity Lymphedema 2/2 CVI,  Obesity, and Mm weakness  Skin  Description Hyper-Keratosis Peau d' Orange Shiny Tight Fibrotic/ Indurated Fatty Hard Spongy/ boggy   x   x R>L x x x   Skin dry Flaky WNL Macerated   mildly      Color Redness Varicosities Blanching Hemosiderin Stain Mottled        x   Odor Malodorous Yeast Fungal infection  WNL      x   Temperature Warm Cool wnl    x     Pitting Edema   1+ 2+ 3+  4+ Non-pitting   x         Girth Symmetrical Asymmetrical                   Distribution    R>L toes to groin, bilaterally    Stemmer Sign Positive Negative   +    Lymphorrhea History  Of:  Present Absent     x    Wounds History Of Present Absent Venous Arterial Pressure Sheer   denies  x        Signs of Infection Redness Warmth Erythema Acute Swelling Drainage Borders                    Sensation Light Touch Deep pressure Hypersensitivity   In tact Impaired In tact Impaired Absent Impaired   x  x  x     Nails WNL   Fungus nail dystrophy   TBA     Hair Growth Symmetrical Asymmetrical   TB Ax    Skin Creases Base of toes  Ankles   Base of Fingers knees       Abdominal pannus Thigh Lobules  Face/neck   x x  x       GAIT: Pt transported to clinic in transport wheelchair Distance walked: Pt able to transfer out of wc and walk 4-5 steps using 2 wheeled walker to Rx bed with extra time (modified independent).  Assistive device utilized: Pt able to lift legs onto Rx bed, one at a time, using hands to actively assist Level of assistance: Modified independence Comments: Transfers  and bed mobility take an inordinate amount of time  LYMPHEDEMA LIFE IMPACT SCALE (LLIS): Initial: 86.76% (The extent to which LE-related problems impacted your life over the past week.)                                                                                                                           TREATMENT DATE: 02/12/24 OT evaluation Pt/ CG edu   PATIENT EDUCATION:  Continued Pt/ CG edu for lymphedema self care home program throughout session. Topics include outcome of comparative limb volumetrics- starting limb volume differentials (LVDs), technology and gradient techniques used for short stretch, multilayer compression wrapping, simple self-MLD, therapeutic lymphatic pumping exercises, skin/nail care, LE precautions, compression garment recommendations and specifications, wear and care schedule and compression garment donning / doffing w assistive devices. Discussed progress towards all OT goals since commencing CDT. Discussed detrimental impact of obesity on lower and  upper extremity lymphedema over time. Reviewed OT goals for lymphedema care with Pt and discussed progress to date.  All questions answered to the Pt's satisfaction. Good return. Person educated: Patient and family Education method: Explanation, Demonstration, and Handouts Education comprehension: verbalized understanding, returned demonstration, verbal cues required, and needs further education  LYMPHEDEMA SELF-CARE HOME PROGRAM: BLE lymphatic pumping there ex- 1 set of 10 each element, in order. Hold 5. 2 x daily 2. Daily, short stretch, thigh length, multilayer  compression bandages during Intensive Phase CDT 3. During self-Management Phase fit with appropriate compression garments and/ or devices 3. Daily skin care with low ph lotion matching skin ph 4. Daily simple self MLD   ASSESSMENT:  CLINICAL IMPRESSION:  R LEG volume is dramatically reduced today after 3 days in wraps between visits. Pt's daughter did an excellent job with compression. Commenced manual therapy today. Pt tolerated RLE/RLQ MLD without increased pain or sob. Pt will need daily caregiver assistance with compression and MLD in order to achieve volume reduction, and all OT goals for CDT.  Daughter is not available daily as planned, so Pt will research available services in [place]. Cont as per POC.  (02/12/24 Initial OT Eval : STUART GUILLEN is a 73 yo female presenting with moderate, stage  II, bilateral lower extremity lymphedema 2/2 CVI,  obesity, and mm weakness. She will benefit from skilled Occupational Therapy to reduce limb swelling and associated pain, to limit progression and infection risk. BLE lymphedema limits functional performance in all occupational domains, including functional mobility and ambulation,  basic and instrumental ADLs, productive activities, leisure pursuits, social participation and quality of life. This patient will benefit from modified Intensive and Self Management Phase CDT . In addition to MLD,  ther ex, skin care and compression bandaging below the knees, Pt will be fitted with custom knee length compression stockings paired with off the shelf , Capri length compression leggings. If insurance benefits allow, she may undergo a trial in the clinic with the advanced Flexitouch device in an effort to reduce discomfort and reduce infection risk. Without skilled OT Li-lymphedema will worsen over time and further functional decline is expected.    Ms Mastropietro is unable to reach feet and distal legs to apply multilayer , gradient compression wraps during the Intensive Phase of CDT.  She understands this limitation and agrees to  arrange for a caregiver to assist her daily with compression wrapping between OT visits. With a caregiver to assist her with Intensive Phase compression her prognosis is fair. Without CG assistance her prognosis is poor.)  OBJECTIVE IMPAIRMENTS: decreased activity tolerance, decreased balance, decreased endurance, decreased knowledge of condition, decreased knowledge of use of DME, decreased mobility, difficulty walking, decreased ROM, decreased strength, increased edema, impaired UE functional use, postural dysfunction, obesity, pain, and chronic leg swelling.   ACTIVITY LIMITATIONS: ACTIVITY LIMITATIONS: Mobility and functional ambulation limitations:  abnormal gait pattern, difficulty walking, carrying, lifting, bending, sitting, standing, squatting, stairs, transfers, and bed mobility Basic and instrumental ADLs (reaching feet and distal legs to groom nails, inspect skin, apply lotion, bathe lower body, difficulty with LB dressing, including fitting LB clothing, shoes and socks, impaired sleeping, meal prep, standing to cook, driving, shopping, yard work, house work Paediatric nurse activities: work related activities requiring extended standing, walking, sitting; caring for others Social participation in the community, socializing with others, LE affects body image  Leisure  pursuits requiring extended standing, walking, sitting  PERSONAL FACTORS: Fitness, Time since onset of injury/illness/exacerbation, and 3+ comorbidities: Obesity, arthritis, OSA are also affecting patient's functional outcome.   REHAB POTENTIAL: Fair with daily caregiver assistance with gradient compression wrapping. Without assistance prognosis is poor as Pt is unable to apply wraps independently  EVALUATION COMPLEXITY: Moderate   GOALS: Goals reviewed with patient? Yes   SHORT TERM GOALS: Target date: 4th OT Rx visit  Pt will demonstrate understanding of lymphedema precautions and prevention strategies with modified independence using a printed reference to identify at least 5 precautions and discussing  how s/he may implement them into daily life to reduce risk of progression with modified assistance Baseline: max a Goal status: INITIAL   2.  With Max caregiver assistance Pt will be able to apply multilayer, knee length, compression wraps using gradient techniques to decrease limb volume, to limit infection risk, and to limit lymphedema progression.  Given this patient's Intake score of tbd % on the Lymphedema Life Impact Scale (LLIS), patient will experience a reduction of at least 5% in her perceived level of functional impairment resulting from lymphedema to improve functional performance and quality of life (QOL). Baseline: Dependent Goal status: INITIAL     LONG TERM GOALS: Target date: 05/13/24 Given this patient's Intake score of 86.76 % on the Lymphedema Life Impact Scale (LLIS), patient will experience a reduction of at least 10% in her perceived level of functional impairment resulting from lymphedema to improve functional performance and quality of life (QOL).Baseline: tbd Baseline: max a Goal status: INITIAL     2.  With modified independence (extra time and assistive devices) Pt will be able to don and doff appropriate compression garments and/or devices to control BLE  lymphedema and to limit progression.  Baseline: Dependent Goal status: INITIAL   3. Pt will achieve at least a 10% volume reductions bilaterally below the knees to return limb to more typical size and shape, to limit infection risk and LE progression, to decrease pain, to improve function, and to improve body image and QOL. Baseline: Dependent Goal status: INITIAL   4. Pt will achieve and sustain at least 85% compliance with all LE self-care home program components throughout CDT, including modified simple self-MLD, daily skin care and inspection, lymphatic pumping the ex and appropriate compression to limit lymphedema progression and to limit further functional decline. Baseline: Dependent. (Note: W/out daily assistance with donning/ doffing compression  , prognosis is poor.) Goal status: INITIAL   PLAN:  OT FREQUENCY: 2x/week and PRN  OT DURATION: other: 18 weeks and PRN  PLANNED INTERVENTIONS:  Complete Decongestive Therapy (CDT): Manual Lymphatic Drainage (MLD) , Skin care, ther ex, gradient compression 97110-Therapeutic exercises, 97530- Therapeutic activity, 97535- Self Care, [id number]- Manual therapy, Patient/Family education, Manual lymph drainage, Compression bandaging, DME instructions, and trial with advanced, sequential, pneumatic, compression device (Tactile Medical Flexitouch) Pt will arrange for a caregiver to assist her daily with compression wrapping between OT visits.  Custom-made gradient compression garments and HOS devices are medically necessary because they are uniquely sized and shaped to fit the exact dimensions of the affected extremities, and to provide appropriate medical grade, graduated compression essential for optimally managing chronic, progressive lymphedema. Multiple custom compression garments are needed to ensure proper hygiene to limit infection risk. Custom compression garments should be replaced q 3-6 months When worn consistently for optimal lipo-lymphedema  self-management over time. HOS devices, medically necessary to limit fibrosis buildup in tissue, should be replaced q 2 years and PRN when worn out.   PLAN FOR NEXT SESSION:  Pt/ caregiver edu Knee length, Multilayer compression wraps to one leg only-Pt's choice.  Zebedee Dec, MS, OTR/L, CLT-LANA 02/27/24 11:58 AM'

## 2024-03-01 ENCOUNTER — Ambulatory Visit: Admitting: Occupational Therapy

## 2024-03-01 DIAGNOSIS — I890 Lymphedema, not elsewhere classified: Secondary | ICD-10-CM | POA: Diagnosis not present

## 2024-03-01 NOTE — Therapy (Signed)
 OUTPATIENT OCCUPATIONAL THERAPY TREATMENT NOTE  LOWER EXTREMITY LYMPHEDEMA  Patient Name: Natasha [name] MRN: [id number] DOB:09-Oct-1950, 73 y.o., female Today's Date: 03/01/2024  END OF SESSION:  .  OT End of Session - 03/01/24 1324     Visit Number 5    Number of Visits 36    Date for OT Re-Evaluation 05/13/24    OT Start Time 0108    OT Stop Time 0209    OT Time Calculation (min) 61 min    Activity Tolerance Patient tolerated treatment well;No increased pain    Behavior During Therapy [facility] for tasks assessed/performed            Past Medical History:  Diagnosis Date   Abdominal spasms 12/14/2020   Acute pulmonary embolism (HCC) 08/21/2019   Bacterial conjunctivitis of both eyes 12/14/2020   Bilateral leg edema 02/10/2020   Bradycardia    Breast cancer screening by mammogram 11/24/2020   CARCINOMA, THYROID  GLAND, PAPILLARY 05/04/2008   Stage 2, 8/09: thyroidectomy for 2.7cm papillary adenocarcinoma (t2 n0 mo) 9/09: I-131 rx, 108 mci 05/10: tg is neg (ab neg) , total body scan is neg   Colon cancer screening 11/24/2020   Colon polyps    Complication of anesthesia    trouble with airway   Cough 12/25/2007   Qualifier: Diagnosis of  By: Germaine LPN, Megan     RNCPI-80 08/19/2019   Diverticulosis    DVT (deep venous thrombosis) (HCC)    Edema 12/22/2008   Encounter to establish care 11/24/2020   History of 2019 novel coronavirus disease (COVID-19) 11/09/2019   HYPERTENSION 12/25/2007   HYPOTHYROIDISM, POSTSURGICAL 05/04/2008   LEG PAIN, LEFT 12/09/2008   Lumbago with sciatica, right side 01/08/2021   Memory loss due to medical condition 11/09/2019   Muscle weakness    Nausea and vomiting 05/23/2008   Qualifier: Diagnosis of  By: Kassie MD, Alyce LABOR   Formatting of this note might be different from the original. Qualifier: Diagnosis of  By: Kassie MD, Sean A   Numbness and tingling    OSA (obstructive sleep apnea) 06/15/2013   CPAP   Paresthesia 01/04/2020   Peripheral edema 12/22/2008    Qualifier: Diagnosis of  By: Delford, MD, CODY Maude Dunnings    Pneumonia due to COVID-19 virus    Pulmonary embolism (HCC)    Pulmonary embolism (HCC) 08/23/2019   Right lower quadrant pain 11/24/2020   Sciatica of left side 12/04/2011   Skin sensation disturbance 12/05/2008   Qualifier: Diagnosis of  By: Inocencio MD, Berwyn LABOR Deal of this note might be different from the original. Qualifier: Diagnosis of  By: Inocencio MD, Berwyn A   Spasm of muscle of lower back 12/14/2020   Vaginal candidiasis 03/16/2021   Weakness 01/04/2020   Past Surgical History:  Procedure Laterality Date   ABDOMINAL HYSTERECTOMY     due to bleeding, ovaries remain   ANKLE SURGERY Right    CERVICAL FUSION     x 2   COLONOSCOPY WITH PROPOFOL  N/A 08/08/2015   Procedure: COLONOSCOPY WITH PROPOFOL ;  Surgeon: Renaye Sous, MD;  Location: [facility];  Service: Endoscopy;  Laterality: N/A;   KNEE SURGERY Left    TOTAL THYROIDECTOMY     Patient Active Problem List   Diagnosis Date Noted   BMI 45.0-49.9, adult (HCC) 10/16/2023   Chronic venous insufficiency 09/10/2023   Mobility impaired 09/10/2023   Weakness of both lower extremities 09/10/2023   Gait instability 09/10/2023   Lymphedema 09/10/2023  Atrophic vaginitis 05/14/2023   Pelvic floor dysfunction 05/14/2023   Hypothyroidism 05/14/2023   Pancreatic insufficiency 05/14/2023   Arthritis of knee 03/24/2023   At risk for fall due to comorbid condition 03/10/2023   Non-recurrent acute suppurative otitis media of left ear without spontaneous rupture of tympanic membrane 02/17/2023   Mood changes 02/17/2023   Neck pain 01/31/2023   Decreased ROM of neck 01/31/2023   Upper back pain 01/31/2023   Acute pain of right shoulder 01/31/2023   Dysuria 01/31/2023   Recurrent urinary tract infection 01/31/2023   Urine frequency 01/31/2023   Walker as ambulation aid 01/31/2023   Ambulatory dysfunction 01/31/2023   Snoring 12/10/2022   Low TSH level  12/10/2022   Prediabetes 11/27/2022   Morbid obesity (HCC) 11/26/2022   BMI 50.0-59.9, adult (HCC) 11/26/2022   Elevated glucose 11/26/2022   B12 deficiency 11/26/2022   Health care maintenance 11/26/2022   SOB (shortness of breath) on exertion 11/26/2022   Other fatigue 11/26/2022   Dermatofibroma 10/11/2022   Candida infection 10/11/2022   Fatty liver 04/20/2021   Incontinence of urine in female 04/20/2021   Vitamin D  deficiency 03/16/2021   Hip pain 12/22/2020   Constipation 11/24/2020   Gastroesophageal reflux disease 11/24/2020   Groin pain, chronic, right 11/24/2020   Chronic right-sided low back pain with right-sided sciatica 11/24/2020   OAB (overactive bladder) 11/24/2020   Aortic atherosclerosis (HCC) 11/24/2020   Right-sided heart failure (HCC) 11/24/2020   History of pulmonary embolism 02/10/2020   Bilateral lower extremity edema 02/10/2020   Gait abnormality 01/04/2020   Muscle weakness (generalized) 11/09/2019   Pulmonary nodule 11/09/2019   Obesity, morbid, BMI 50 or higher (HCC) 08/24/2019   OSA (obstructive sleep apnea) 06/15/2013   Allergic rhinitis 01/21/2009   History of papillary adenocarcinoma of thyroid  05/04/2008   Hypothyroidism, postsurgical 05/04/2008   Essential hypertension 12/25/2007   Dyspnea 12/25/2007    PCP: Camie Doing, NP  REFERRING PROVIDER: Camie Doing, NP  REFERRING DIAG: I89.0  THERAPY DIAG:  Lymphedema, not elsewhere classified  Rationale for Evaluation and Treatment: Rehabilitation  ONSET DATE: >15 years  SUBJECTIVE:                                                                                                                                                                                          SUBJECTIVE STATEMENT: [name] presents to OT in manual transport wc for LE care. Pain is unchanged from initial eval. Pt is accompanied by her daughter and granddaughter. Pt has no new complaints. Pt performs wc transfers with  extra time. Pt reports R leg swelling is much improved since last visit.  Pt reports her adult daughter helped her wrap and it went well, but her daughter is unable to assist her daily. We discussed some local resources for adults needing assistance with some self care at home. Pt will research further through her local department on aging.  PERTINENT HISTORY:  Hypothyroidism, postsurgical Essential hypertension Dyspnea OSA (obstructive sleep apnea) Obesity, morbid, BMI 50 or higher (HCC) Muscle weakness (generalized) Gait abnormality History of pulmonary embolism Bilateral lower extremity edema Chronic right-sided low back pain with right-sided sciatica Right-sided heart failure (HCC) Hip pain Incontinence of urine in female Dermatofibroma Morbid obesity (HCC) BMI 50.0-59.9, adult (HCC) SOB (shortness of breath) on exertion Other fatigue Prediabetes Decreased ROM of neck Upper back pain Acute pain of right shoulder Recurrent urinary tract infection Urine frequency Walker as ambulation aid Ambulatory dysfunction Mood changes At risk for fall due to comorbid condition Arthritis of knee Hypothyroidism Chronic venous insufficiency Mobility impaired Weakness of both lower extremities Gait instability Lymphedema BMI 45.0-49.9, adult (HCC)  PAIN:  Are you having pain? Yes, B knees: NPRS scale: 6/10 Pain location:  Pain description: creaky crackly, tight, heavy, sore, tender. With and without weight bearing Aggravating factors: standing, walking, weight bearing Relieving factors: walking  PRECAUTIONS: Other: LYMPHEDEMA PRECAUTIONS: prediabetic, hypothyroid,  RED FLAGS: Urinary incontinence; numbness and tingling in fingers, hands and toes   WEIGHT BEARING RESTRICTIONS: No  FALLS:  Has patient fallen in last 6 months? No  LIVING ENVIRONMENT: Lives with: lives with their daughter and  granddaughter Lives in: House/apartment Stairs: Yes; Internal: 14 steps; on right  going up and External: garage 4 steps steps; can reach both Has following equipment at home: Walker - 4 wheeled, shower chair, and elevated toilet seat  OCCUPATION: retired Careers information officer professor  LEISURE: concerts, writing and producing plays  HAND DOMINANCE: right   PRIOR LEVEL OF FUNCTION: Independent  PATIENT GOALS: walk unassisted; lose weight   OBJECTIVE: Note: Objective measures were completed at Evaluation unless otherwise noted.  COGNITION:  Overall cognitive status: Within functional limits for tasks assessed   OBSERVATIONS / OTHER ASSESSMENTS:   POSTURE: head forward, slight shoulder protraction  LE ROM: Limited at hips knees and ankles 2/2 body habitus and skin approximation  LE MMT: generalized weakness  LYMPHEDEMA ASSESSMENTS:   BLE COMPARATIVE LIMB VOLUMETRICS: Initial 02/17/24  LANDMARK RIGHT   R LEG (A-D) 6530.2 ml  R THIGH (E-G) ml  R FULL LIMB (A-G) ml  Limb Volume differential (LVD)  %  Volume change since initial %  Volume change overall V  (Blank rows = not tested)  LANDMARK LEFT  L LEG (A-D) 6601.0  L  THIGH (E-G) ml  L  FULL LIMB (A-G) ml  Limb Volume differential (LVD)  1.08% L>R  Volume change since initial %  Volume change overall %  (Blank rows = not tested)      SKIN/TISSUE INTEGRITY: : Moderate, Stage  II, Bilateral Lower Extremity Lymphedema 2/2 CVI,  Obesity, and Mm weakness  Skin  Description Hyper-Keratosis Peau d' Orange Shiny Tight Fibrotic/ Indurated Fatty Hard Spongy/ boggy   x   x R>L x x x   Skin dry Flaky WNL Macerated   mildly      Color Redness Varicosities Blanching Hemosiderin Stain Mottled        x   Odor Malodorous Yeast Fungal infection  WNL      x   Temperature Warm Cool wnl    x     Pitting Edema   1+ 2+ 3+  4+ Non-pitting   x         Girth Symmetrical Asymmetrical                   Distribution    R>L toes to groin, bilaterally    Stemmer Sign Positive Negative   +    Lymphorrhea History  Of:  Present Absent     x    Wounds History Of Present Absent Venous Arterial Pressure Sheer   denies  x        Signs of Infection Redness Warmth Erythema Acute Swelling Drainage Borders                    Sensation Light Touch Deep pressure Hypersensitivity   In tact Impaired In tact Impaired Absent Impaired   x  x  x     Nails WNL   Fungus nail dystrophy   TBA     Hair Growth Symmetrical Asymmetrical   TB Ax    Skin Creases Base of toes  Ankles   Base of Fingers knees       Abdominal pannus Thigh Lobules  Face/neck   x x  x       GAIT: Pt transported to clinic in transport wheelchair Distance walked: Pt able to transfer out of wc and walk 4-5 steps using 2 wheeled walker to Rx bed with extra time (modified independent).  Assistive device utilized: Pt able to lift legs onto Rx bed, one at a time, using hands to actively assist Level of assistance: Modified independence Comments: Transfers  and bed mobility take an inordinate amount of time  LYMPHEDEMA LIFE IMPACT SCALE (LLIS): Initial: 86.76% (The extent to which LE-related problems impacted your life over the past week.)                                                                                                                           TREATMENT DATE: 02/12/24 OT evaluation Pt/ CG edu   PATIENT EDUCATION:  Continued Pt/ CG edu for lymphedema self care home program throughout session. Topics include outcome of comparative limb volumetrics- starting limb volume differentials (LVDs), technology and gradient techniques used for short stretch, multilayer compression wrapping, simple self-MLD, therapeutic lymphatic pumping exercises, skin/nail care, LE precautions, compression garment recommendations and specifications, wear and care schedule and compression garment donning / doffing w assistive devices. Discussed progress towards all OT goals since commencing CDT. Discussed detrimental impact of obesity on lower and  upper extremity lymphedema over time. Reviewed OT goals for lymphedema care with Pt and discussed progress to date.  All questions answered to the Pt's satisfaction. Good return. Person educated: Patient and family Education method: Explanation, Demonstration, and Handouts Education comprehension: verbalized understanding, returned demonstration, verbal cues required, and needs further education  LYMPHEDEMA SELF-CARE HOME PROGRAM: BLE lymphatic pumping there ex- 1 set of 10 each element, in order. Hold 5. 2 x daily 2. Daily, short stretch, thigh length, multilayer  compression bandages during Intensive Phase CDT 3. During self-Management Phase fit with appropriate compression garments and/ or devices 3. Daily skin care with low ph lotion matching skin ph 4. Daily simple self MLD   ASSESSMENT:  CLINICAL IMPRESSION:  Continued manual therapy today. Pt tolerated RLE/RLQ MLD without increased pain or sob. Pt will need daily caregiver assistance with compression and MLD in order to achieve volume reduction, and all OT goals for CDT.  Daughter is not available daily as planned, so Pt will research available services in [place]. Cont as per POC.  (02/12/24 Initial OT Eval : TATUM CORL is a 73 yo female presenting with moderate, stage  II, bilateral lower extremity lymphedema 2/2 CVI,  obesity, and mm weakness. She will benefit from skilled Occupational Therapy to reduce limb swelling and associated pain, to limit progression and infection risk. BLE lymphedema limits functional performance in all occupational domains, including functional mobility and ambulation,  basic and instrumental ADLs, productive activities, leisure pursuits, social participation and quality of life. This patient will benefit from modified Intensive and Self Management Phase CDT . In addition to MLD, ther ex, skin care and compression bandaging below the knees, Pt will be fitted with custom knee length compression stockings paired  with off the shelf , Capri length compression leggings. If insurance benefits allow, she may undergo a trial in the clinic with the advanced Flexitouch device in an effort to reduce discomfort and reduce infection risk. Without skilled OT Li-lymphedema will worsen over time and further functional decline is expected.    Ms Winkles is unable to reach feet and distal legs to apply multilayer , gradient compression wraps during the Intensive Phase of CDT.  She understands this limitation and agrees to  arrange for a caregiver to assist her daily with compression wrapping between OT visits. With a caregiver to assist her with Intensive Phase compression her prognosis is fair. Without CG assistance her prognosis is poor.)  OBJECTIVE IMPAIRMENTS: decreased activity tolerance, decreased balance, decreased endurance, decreased knowledge of condition, decreased knowledge of use of DME, decreased mobility, difficulty walking, decreased ROM, decreased strength, increased edema, impaired UE functional use, postural dysfunction, obesity, pain, and chronic leg swelling.   ACTIVITY LIMITATIONS: ACTIVITY LIMITATIONS: Mobility and functional ambulation limitations:  abnormal gait pattern, difficulty walking, carrying, lifting, bending, sitting, standing, squatting, stairs, transfers, and bed mobility Basic and instrumental ADLs (reaching feet and distal legs to groom nails, inspect skin, apply lotion, bathe lower body, difficulty with LB dressing, including fitting LB clothing, shoes and socks, impaired sleeping, meal prep, standing to cook, driving, shopping, yard work, house work Paediatric nurse activities: work related activities requiring extended standing, walking, sitting; caring for others Social participation in the community, socializing with others, LE affects body image  Leisure pursuits requiring extended standing, walking, sitting  PERSONAL FACTORS: Fitness, Time since onset of injury/illness/exacerbation, and  3+ comorbidities: Obesity, arthritis, OSA are also affecting patient's functional outcome.   REHAB POTENTIAL: Fair with daily caregiver assistance with gradient compression wrapping. Without assistance prognosis is poor as Pt is unable to apply wraps independently  EVALUATION COMPLEXITY: Moderate   GOALS: Goals reviewed with patient? Yes   SHORT TERM GOALS: Target date: 4th OT Rx visit  Pt will demonstrate understanding of lymphedema precautions and prevention strategies with modified independence using a printed reference to identify at least 5 precautions and discussing how s/he may implement them into daily life to reduce risk of progression with modified assistance Baseline: max a Goal status: INITIAL  2.  With Max caregiver assistance Pt will be able to apply multilayer, knee length, compression wraps using gradient techniques to decrease limb volume, to limit infection risk, and to limit lymphedema progression.  Given this patient's Intake score of tbd % on the Lymphedema Life Impact Scale (LLIS), patient will experience a reduction of at least 5% in her perceived level of functional impairment resulting from lymphedema to improve functional performance and quality of life (QOL). Baseline: Dependent Goal status: INITIAL     LONG TERM GOALS: Target date: 05/13/24 Given this patient's Intake score of 86.76 % on the Lymphedema Life Impact Scale (LLIS), patient will experience a reduction of at least 10% in her perceived level of functional impairment resulting from lymphedema to improve functional performance and quality of life (QOL).Baseline: tbd Baseline: max a Goal status: INITIAL     2.  With modified independence (extra time and assistive devices) Pt will be able to don and doff appropriate compression garments and/or devices to control BLE lymphedema and to limit progression.  Baseline: Dependent Goal status: INITIAL   3. Pt will achieve at least a 10% volume reductions  bilaterally below the knees to return limb to more typical size and shape, to limit infection risk and LE progression, to decrease pain, to improve function, and to improve body image and QOL. Baseline: Dependent Goal status: INITIAL   4. Pt will achieve and sustain at least 85% compliance with all LE self-care home program components throughout CDT, including modified simple self-MLD, daily skin care and inspection, lymphatic pumping the ex and appropriate compression to limit lymphedema progression and to limit further functional decline. Baseline: Dependent. (Note: W/out daily assistance with donning/ doffing compression  , prognosis is poor.) Goal status: INITIAL   PLAN:  OT FREQUENCY: 2x/week and PRN  OT DURATION: other: 18 weeks and PRN  PLANNED INTERVENTIONS:  Complete Decongestive Therapy (CDT): Manual Lymphatic Drainage (MLD) , Skin care, ther ex, gradient compression 97110-Therapeutic exercises, 97530- Therapeutic activity, 97535- Self Care, [id number]- Manual therapy, Patient/Family education, Manual lymph drainage, Compression bandaging, DME instructions, and trial with advanced, sequential, pneumatic, compression device (Tactile Medical Flexitouch) Pt will arrange for a caregiver to assist her daily with compression wrapping between OT visits.  Custom-made gradient compression garments and HOS devices are medically necessary because they are uniquely sized and shaped to fit the exact dimensions of the affected extremities, and to provide appropriate medical grade, graduated compression essential for optimally managing chronic, progressive lymphedema. Multiple custom compression garments are needed to ensure proper hygiene to limit infection risk. Custom compression garments should be replaced q 3-6 months When worn consistently for optimal lipo-lymphedema self-management over time. HOS devices, medically necessary to limit fibrosis buildup in tissue, should be replaced q 2 years and PRN  when worn out.   PLAN FOR NEXT SESSION:  Pt/ caregiver edu Knee length, Multilayer compression wraps to one leg only-Pt's choice.  Zebedee Dec, MS, OTR/L, CLT-LANA 03/01/24 4:06 PM'

## 2024-03-02 ENCOUNTER — Telehealth: Payer: Self-pay

## 2024-03-02 ENCOUNTER — Ambulatory Visit: Payer: Medicare PPO

## 2024-03-02 DIAGNOSIS — Z5982 Transportation insecurity: Secondary | ICD-10-CM

## 2024-03-02 DIAGNOSIS — Z0000 Encounter for general adult medical examination without abnormal findings: Secondary | ICD-10-CM

## 2024-03-02 DIAGNOSIS — E2839 Other primary ovarian failure: Secondary | ICD-10-CM

## 2024-03-02 NOTE — Progress Notes (Unsigned)
 Complex Care Management Note Care Guide Note  03/02/2024 Name: Natasha [name] MRN: [id number] DOB: [birth year]   Complex Care Management Outreach Attempts: An unsuccessful telephone outreach was attempted today to offer the patient information about available complex care management services.  Follow Up Plan:  Additional outreach attempts will be made to offer the patient complex care management information and services.   Encounter Outcome:  No Answer  Leotis Rase [facility], [facility] Guide  Direct Dial: [phone]  Fax [phone]

## 2024-03-02 NOTE — Progress Notes (Signed)
 Subjective:   Natasha [name] is a 73 y.o. who presents for a Medicare Wellness preventive visit.  As a reminder, Annual Wellness Visits don't include a physical exam, and some assessments may be limited, especially if this visit is performed virtually. We may recommend an in-person follow-up visit with your provider if needed.  Visit Complete: Virtual I connected with  Natasha [name] on 03/02/24 by a video and audio enabled telemedicine application and verified that I am speaking with the correct person using two identifiers.  Patient Location: Home  Provider Location: Office/Clinic  I discussed the limitations of evaluation and management by telemedicine. The patient expressed understanding and agreed to proceed.  Vital Signs: Because this visit was a virtual/telehealth visit, some criteria may be missing or patient reported. Any vitals not documented were not able to be obtained and vitals that have been documented are patient reported.    Persons Participating in Visit: Patient.  AWV Questionnaire: No: Patient Medicare AWV questionnaire was not completed prior to this visit.  Cardiac Risk Factors include: advanced age (>57men, >40 women);hypertension     Objective:    Today's Vitals   There is no height or weight on file to calculate BMI.     03/02/2024    3:12 PM 02/16/2024    9:52 AM 02/12/2024    9:20 AM 10/11/2022    1:47 PM 07/12/2022    8:01 AM 05/22/2021    2:30 PM 12/06/2020    2:43 PM  Advanced Directives  Does Patient Have a Medical Advance Directive? No No No No No No No  Would patient like information on creating a medical advance directive?  No - Patient declined No - Patient declined No - Patient declined No - Patient declined  No - Patient declined    Current Medications (verified) Outpatient Encounter Medications as of 03/02/2024  Medication Sig   acetaminophen  (TYLENOL ) 500 MG tablet Take 1 tablet (500 mg total) by mouth every 6 (six) hours as needed.    AMBULATORY NON FORMULARY MEDICATION Motorized Scooter based on insurance coverage.   Elastic Bandages & Supports (MEDICAL COMPRESSION STOCKINGS) MISC Thigh high compression stockings for venous insufficiency and lymphedema. Please measure ankle, calf, and thigh circumference and leg length for fit. Brand per insurance. Requesting one pair.   Elastic Bandages & Supports (MEDICAL COMPRESSION STOCKINGS) MISC 30-40 mmHg knee length compression stockings for venous insufficiency and lymphedema. Please measure ankle and calf circumference and leg length for fit. Brand per insurance coverage. Requesting 3 pair.   furosemide  (LASIX ) 20 MG tablet Take 1 tablet (20 mg total) by mouth 2 (two) times daily.   levothyroxine  (SYNTHROID ) 125 MCG tablet Take 1 tablet (125 mcg total) by mouth daily.   Multiple Vitamin (MULTIVITAMIN WITH MINERALS) TABS tablet Take 1 tablet by mouth daily.   potassium chloride  (KLOR-CON ) 10 MEQ tablet Take 1 tablet (10 mEq total) by mouth 2 (two) times daily.   rivaroxaban  (XARELTO ) 20 MG TABS tablet Take 1 tablet (20 mg total) by mouth daily with supper.   valsartan  (DIOVAN ) 160 MG tablet Take 1 tablet (160 mg total) by mouth daily.   Vitamin D , Ergocalciferol , (DRISDOL ) 1.25 MG (50000 UNIT) CAPS capsule Take 1 capsule (50,000 Units total) by mouth every 7 (seven) days.   estradiol (ESTRACE) 0.1 MG/GM vaginal cream PLEASE SEE ATTACHED FOR DETAILED DIRECTIONS (Patient not taking: Reported on 03/02/2024)   montelukast  (SINGULAIR ) 10 MG tablet Take 1 tablet (10 mg total) by mouth at bedtime. (Patient not  taking: Reported on 03/02/2024)   nystatin  cream (MYCOSTATIN ) Apply 1 Application topically 2 (two) times daily. (Patient not taking: Reported on 03/02/2024)   pantoprazole  (PROTONIX ) 40 MG tablet Take 1 tablet (40 mg total) by mouth daily. (Patient not taking: Reported on 03/02/2024)   Semaglutide ,0.25 or 0.5MG /DOS, (OZEMPIC , 0.25 OR 0.5 MG/DOSE,) 2 MG/1.5ML SOPN Inject 0.25 mg into the skin once  a week. (Patient not taking: Reported on 03/02/2024)   Semaglutide ,0.25 or 0.5MG /DOS, (OZEMPIC , 0.25 OR 0.5 MG/DOSE,) 2 MG/3ML SOPN Inject 0.5 mg into the skin once a week. (Patient not taking: Reported on 03/02/2024)   Vibegron  75 MG TABS Take by mouth. (Patient not taking: Reported on 03/02/2024)   ZENPEP 40000-126000 units CPEP Take 2 capsules by mouth 3 (three) times daily. (Patient not taking: Reported on 03/02/2024)   No facility-administered encounter medications on file as of 03/02/2024.    Allergies (verified) Codeine and Egg-derived products   History: Past Medical History:  Diagnosis Date   Abdominal spasms 12/14/2020   Acute pulmonary embolism (HCC) 08/21/2019   Bacterial conjunctivitis of both eyes 12/14/2020   Bilateral leg edema 02/10/2020   Bradycardia    Breast cancer screening by mammogram 11/24/2020   CARCINOMA, THYROID  GLAND, PAPILLARY 05/04/2008   Stage 2, 8/09: thyroidectomy for 2.7cm papillary adenocarcinoma (t2 n0 mo) 9/09: I-131 rx, 108 [facility] 05/10: tg is neg (ab neg) , total body scan is neg   Colon cancer screening 11/24/2020   Colon polyps    Complication of anesthesia    trouble with airway   Cough 12/25/2007   Qualifier: Diagnosis of  By: Germaine LPN, Megan     RNCPI-80 08/19/2019   Diverticulosis    DVT (deep venous thrombosis) (HCC)    Edema 12/22/2008   Encounter to establish care 11/24/2020   History of 2019 novel coronavirus disease (COVID-19) 11/09/2019   HYPERTENSION 12/25/2007   HYPOTHYROIDISM, POSTSURGICAL 05/04/2008   LEG PAIN, LEFT 12/09/2008   Lumbago with sciatica, right side 01/08/2021   Memory loss due to medical condition 11/09/2019   Muscle weakness    Nausea and vomiting 05/23/2008   Qualifier: Diagnosis of  By: Kassie MD, Alyce LABOR   Formatting of this note might be different from the original. Qualifier: Diagnosis of  By: Kassie MD, Sean A   Numbness and tingling    OSA (obstructive sleep apnea) 06/15/2013   CPAP   Paresthesia 01/04/2020   Peripheral edema  12/22/2008   Qualifier: Diagnosis of  By: Delford, MD, CODY Maude Dunnings    Pneumonia due to COVID-19 virus    Pulmonary embolism (HCC)    Pulmonary embolism (HCC) 08/23/2019   Right lower quadrant pain 11/24/2020   Sciatica of left side 12/04/2011   Skin sensation disturbance 12/05/2008   Qualifier: Diagnosis of  By: Inocencio MD, Berwyn LABOR Deal of this note might be different from the original. Qualifier: Diagnosis of  By: Inocencio MD, Berwyn A   Spasm of muscle of lower back 12/14/2020   Vaginal candidiasis 03/16/2021   Weakness 01/04/2020   Past Surgical History:  Procedure Laterality Date   ABDOMINAL HYSTERECTOMY     due to bleeding, ovaries remain   ANKLE SURGERY Right    CERVICAL FUSION     x 2   COLONOSCOPY WITH PROPOFOL  N/A 08/08/2015   Procedure: COLONOSCOPY WITH PROPOFOL ;  Surgeon: Renaye Sous, MD;  Location: [facility];  Service: Endoscopy;  Laterality: N/A;   KNEE SURGERY Left    TOTAL THYROIDECTOMY  Family History  Problem Relation Age of Onset   Heart disease Mother    Kidney disease Mother    Other Father        unsure of medical history   Social History   Socioeconomic History   Marital status: Widowed    Spouse name: Not on file   Number of children: 3   Years of education: college   Highest education level: Master's degree (e.g., MA, MS, MEng, MEd, MSW, MBA)  Occupational History   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   Right-handed.   No daily caffeine use.   Social Drivers of Corporate investment banker Strain: Low Risk  (03/02/2024)   Overall Financial Resource Strain (CARDIA)    Difficulty of Paying Living Expenses: Not  hard at all  Food Insecurity: No Food Insecurity (03/02/2024)   Hunger Vital Sign    Worried About Running Out of Food in the Last Year: Never true    Ran Out of Food in the Last Year: Never true  Transportation Needs: Unmet Transportation Needs (03/02/2024)   PRAPARE - Administrator, Civil Service (Medical): Yes    Lack of Transportation (Non-Medical): Yes  Physical Activity: Inactive (03/02/2024)   Exercise Vital Sign    Days of Exercise per Week: 0 days    Minutes of Exercise per Session: 0 min  Stress: Stress Concern Present (03/02/2024)   [employer] of Occupational Health - Occupational Stress Questionnaire    Feeling of Stress: To some extent  Social Connections: Moderately Isolated (03/02/2024)   Social Connection and Isolation Panel    Frequency of Communication with Friends and Family: More than three times a week    Frequency of Social Gatherings with Friends and Family: More than three times a week    Attends Religious Services: 1 to 4 times per year    Active Member of [employer] or Organizations: No    Attends Banker Meetings: Never    Marital Status: Widowed    Tobacco Counseling Counseling given: Not Answered    Clinical Intake:  Pre-visit preparation completed: Yes  Pain : No/denies pain     Nutritional Risks: None Diabetes: No  Lab Results  Component Value Date   HGBA1C 6.0 (H) 11/26/2022   HGBA1C 5.7 (H) 10/11/2022   HGBA1C 5.6 11/30/2021     How often do you need to have someone help you when you read instructions, pamphlets, or other written materials from your doctor or pharmacy?: 1 - Never  Interpreter Needed?: No  Information entered by :: NAllen LPN   Activities of Daily Living     03/02/2024    2:54 PM  In your present state of health, do you have any difficulty performing the following activities:  Hearing? 1  Comment asks people to repeat  Vision? 1  Comment a little blurry  Difficulty concentrating or making  decisions? 0  Walking or climbing stairs? 1  Dressing or bathing? 1  Doing errands, shopping? 1  Preparing Food and eating ? N  Using the Toilet? N  In the past six  months, have you accidently leaked urine? N  Do you have problems with loss of bowel control? N  Managing your Medications? N  Managing your Finances? N  Housekeeping or managing your Housekeeping? N    Patient Care Team: Early, Camie BRAVO, NP as PCP - General (Nurse Practitioner) Lonni Slain, MD as PCP - Cardiology (Cardiology) [facility], [facility]  I have updated your Care Teams any recent Medical Services you may have received from other providers in the past year.     Assessment:   This is a routine wellness examination for [facility].  Hearing/Vision screen Hearing Screening - Comments:: Denies hearing issues Vision Screening - Comments:: Regular eye exams, [facility]   Goals Addressed             This Visit's Progress    Patient Stated       03/02/2024, weight loss and mobility, build strength       Depression Screen     03/02/2024    3:17 PM 10/11/2022    1:42 PM 02/19/2022   10:53 AM 01/16/2022   11:02 AM 11/30/2021   10:17 AM 03/16/2021    6:40 PM 01/11/2020   11:15 AM  PHQ 2/9 Scores  PHQ - 2 Score 2 0 0 0 0 0 2  PHQ- 9 Score 8  0      Exception Documentation   Medical reason  Medical reason      Fall Risk     03/02/2024    3:13 PM 10/11/2022    1:42 PM 02/19/2022   10:52 AM 11/30/2021   10:17 AM 03/16/2021    6:39 PM  Fall Risk   Falls in the past year? 0 0 0 0 0  Number falls in past yr: 0 0 0 0 0  Injury with Fall? 0 0 0 0 0  Risk for fall due to : Impaired mobility;Impaired balance/gait;Impaired vision No Fall Risks No Fall Risks No Fall Risks Impaired balance/gait  Follow up Falls evaluation completed;Falls prevention discussed Falls evaluation completed Falls evaluation completed        Data saved with a previous flowsheet row definition    MEDICARE RISK AT HOME:   Medicare Risk at Home Any stairs in or around the home?: Yes If so, are there any without handrails?: No Home free of loose throw rugs in walkways, pet beds, electrical cords, etc?: Yes Adequate lighting in your home to reduce risk of falls?: Yes Life alert?: No Use of a cane, walker or w/c?: Yes Grab bars in the bathroom?: No Shower chair or bench in shower?: Yes Elevated toilet seat or a handicapped toilet?: Yes  TIMED UP AND GO:  Was the test performed?  No  Cognitive Function: 6CIT completed      03/10/2020    9:00 AM  Montreal Cognitive Assessment   Visuospatial/ Executive (0/5) 5  Naming (0/3) 3  Attention: Read list of digits (0/2) 1  Attention: Read list of letters (0/1) 1  Attention: Serial 7 subtraction starting at 100 (0/3) 3  Language: Repeat phrase (0/2) 2  Language : Fluency (0/1) 1  Abstraction (0/2) 2  Delayed Recall (0/5) 4  Orientation (0/6) 6  Total 28  Adjusted Score (based on education) 28      03/02/2024    3:19 PM  6CIT Screen  What Year? 0 points  What month? 0 points  What time? 0 points  Count back from 20 0 points  Months in reverse 0 points  Repeat phrase 0 points  Total Score 0 points    Immunizations Immunization History  Administered Date(s) Administered   Fluad Quad(high Dose 65+) 06/30/2022   PFIZER(Purple Top)SARS-COV-2 Vaccination 11/14/2019, 12/06/2019, 07/18/2020   PNEUMOCOCCAL CONJUGATE-20 10/11/2022   Pfizer(Comirnaty)Fall Seasonal Vaccine 12 years and older 06/30/2022   Rsv, Bivalent, Protein Subunit Rsvpref,pf [place]) 04/23/2022    Screening Tests Health Maintenance  Topic Date Due   Zoster Vaccines- Shingrix (1 of 2) Never done   DEXA SCAN  Never done   COVID-19 Vaccine (5 - 2024-25 season) 05/04/2023   DTaP/Tdap/Td (1 - Tdap) 09/09/2024 (Originally 09/09/1969)   INFLUENZA VACCINE  04/02/2024   Medicare Annual Wellness (AWV)  03/02/2025   MAMMOGRAM  11/04/2025   Colonoscopy  06/21/2028   Pneumococcal Vaccine:  50+ Years  Completed   Hepatitis B Vaccines  Aged Out   HPV VACCINES  Aged Out   Meningococcal B Vaccine  Aged Out   Hepatitis C Screening  Discontinued    Health Maintenance  Health Maintenance Due  Topic Date Due   Zoster Vaccines- Shingrix (1 of 2) Never done   DEXA SCAN  Never done   COVID-19 Vaccine (5 - 2024-25 season) 05/04/2023   Health Maintenance Items Addressed: DEXA ordered, Due for covid and shingles vaccine.   Additional Screening:  Vision Screening: Recommended annual ophthalmology exams for early detection of glaucoma and other disorders of the eye. Would you like a referral to an eye doctor? No    Dental Screening: Recommended annual dental exams for proper oral hygiene  Community Resource Referral / Chronic Care Management: CRR required this visit?  Yes   CCM required this visit?  No   Plan:    I have personally reviewed and noted the following in the patient's chart:   Medical and social history Use of alcohol, tobacco or illicit drugs  Current medications and supplements including opioid prescriptions. Patient is not currently taking opioid prescriptions. Functional ability and status Nutritional status Physical activity Advanced directives List of other physicians Hospitalizations, surgeries, and ER visits in previous 12 months Vitals Screenings to include cognitive, depression, and falls Referrals and appointments  In addition, I have reviewed and discussed with patient certain preventive protocols, quality metrics, and best practice recommendations. A written personalized care plan for preventive services as well as general preventive health recommendations were provided to patient.   Ardella FORBES Dawn, LPN   10/10/7972   After Visit Summary: (MyChart) Due to this being a telephonic visit, the after visit summary with patients personalized plan was offered to patient via MyChart   Notes: Please refer to Routing Comments. Gave her the number  to aging gracefully.

## 2024-03-02 NOTE — Patient Instructions (Addendum)
 Natasha [name] , Thank you for taking time out of your busy schedule to complete your Annual Wellness Visit with me. I enjoyed our conversation and look forward to speaking with you again next year. I, as well as your care team,  appreciate your ongoing commitment to your health goals. Please review the following plan we discussed and let me know if I can assist you in the future. Your Game plan/ To Do List    Referrals: If you haven't heard from the office you've been referred to, please reach out to them at the phone provided.  You have an order for:  []   2D Mammogram  []   3D Mammogram  [x]   Bone Density     Please call for appointment:   [facility] [address] [place] [address] [place], [place] [address] [phone]     Make sure to wear two-piece clothing.  No lotions, powders, or deodorants the day of the appointment. Make sure to bring picture ID and insurance card.  Bring list of medications you are currently taking including any supplements.    Follow up Visits: Next Medicare AWV with our clinical staff: 03/08/2025 at 2:50   Have you seen your provider in the last 6 months (3 months if uncontrolled diabetes)? Yes Next Office Visit with your provider: office working on scheduling  Clinician Recommendations:  Aim for 30 minutes of exercise or brisk walking, 6-8 glasses of water, and 5 servings of fruits and vegetables each day.       This is a list of the screening recommended for you and due dates:  Health Maintenance  Topic Date Due   Zoster (Shingles) Vaccine (1 of 2) Never done   DEXA scan (bone density measurement)  Never done   COVID-19 Vaccine (5 - 2024-25 season) 05/04/2023   DTaP/Tdap/Td vaccine (1 - Tdap) 09/09/2024*   Flu Shot  04/02/2024   Medicare Annual Wellness Visit  03/02/2025   Mammogram  11/04/2025   Colon Cancer Screening  06/21/2028   Pneumococcal Vaccine for age over 45  Completed   Hepatitis B Vaccine  Aged Out   HPV Vaccine  Aged Out    Meningitis B Vaccine  Aged Out   Hepatitis C Screening  Discontinued  *Topic was postponed. The date shown is not the original due date.    Advanced directives: (ACP Link)Information on Advanced Care Planning can be found at [place]  Secretary of [facility] Advance Health Care Directives Advance Health Care Directives. [email or web address]  Advance Care Planning is important because it:  [x]  Makes sure you receive the medical care that is consistent with your values, goals, and preferences  [x]  It provides guidance to your family and loved ones and reduces their decisional burden about whether or not they are making the right decisions based on your wishes.  Follow the link provided in your after visit summary or read over the paperwork we have mailed to you to help you started getting your Advance Directives in place. If you need assistance in completing these, please reach out to us  so that we can help you!  See attachments for Preventive Care and Fall Prevention Tips.

## 2024-03-03 ENCOUNTER — Ambulatory Visit: Attending: Nurse Practitioner | Admitting: Occupational Therapy

## 2024-03-03 DIAGNOSIS — M25562 Pain in left knee: Secondary | ICD-10-CM | POA: Insufficient documentation

## 2024-03-03 DIAGNOSIS — R260 Ataxic gait: Secondary | ICD-10-CM | POA: Insufficient documentation

## 2024-03-03 DIAGNOSIS — Z7409 Other reduced mobility: Secondary | ICD-10-CM | POA: Insufficient documentation

## 2024-03-03 DIAGNOSIS — M6281 Muscle weakness (generalized): Secondary | ICD-10-CM | POA: Insufficient documentation

## 2024-03-03 DIAGNOSIS — I890 Lymphedema, not elsewhere classified: Secondary | ICD-10-CM | POA: Insufficient documentation

## 2024-03-03 DIAGNOSIS — R2689 Other abnormalities of gait and mobility: Secondary | ICD-10-CM | POA: Insufficient documentation

## 2024-03-03 DIAGNOSIS — E66813 Obesity, class 3: Secondary | ICD-10-CM | POA: Insufficient documentation

## 2024-03-03 DIAGNOSIS — M25561 Pain in right knee: Secondary | ICD-10-CM | POA: Insufficient documentation

## 2024-03-03 DIAGNOSIS — G8929 Other chronic pain: Secondary | ICD-10-CM | POA: Insufficient documentation

## 2024-03-03 DIAGNOSIS — R262 Difficulty in walking, not elsewhere classified: Secondary | ICD-10-CM | POA: Insufficient documentation

## 2024-03-03 NOTE — Progress Notes (Unsigned)
 Complex Care Management Note Care Guide Note  03/03/2024 Name: Natasha [name] MRN: [id number] DOB: 1951/06/18   Complex Care Management Outreach Attempts: A second unsuccessful outreach was attempted today to offer the patient with information about available complex care management services.  Follow Up Plan:  Additional outreach attempts will be made to offer the patient complex care management information and services.   Encounter Outcome:  No Answer  Leotis Rase [facility], [facility] Guide  Direct Dial: [phone]  Fax [phone]

## 2024-03-04 ENCOUNTER — Encounter: Payer: Self-pay | Admitting: Nurse Practitioner

## 2024-03-04 ENCOUNTER — Ambulatory Visit: Admitting: Nurse Practitioner

## 2024-03-04 VITALS — BP 140/88 | HR 69 | Wt 310.0 lb

## 2024-03-04 DIAGNOSIS — E890 Postprocedural hypothyroidism: Secondary | ICD-10-CM

## 2024-03-04 DIAGNOSIS — R7303 Prediabetes: Secondary | ICD-10-CM

## 2024-03-04 DIAGNOSIS — I890 Lymphedema, not elsewhere classified: Secondary | ICD-10-CM

## 2024-03-04 DIAGNOSIS — R4586 Emotional lability: Secondary | ICD-10-CM | POA: Diagnosis not present

## 2024-03-04 DIAGNOSIS — E538 Deficiency of other specified B group vitamins: Secondary | ICD-10-CM | POA: Diagnosis not present

## 2024-03-04 DIAGNOSIS — Z6841 Body Mass Index (BMI) 40.0 and over, adult: Secondary | ICD-10-CM | POA: Diagnosis not present

## 2024-03-04 DIAGNOSIS — E559 Vitamin D deficiency, unspecified: Secondary | ICD-10-CM | POA: Diagnosis not present

## 2024-03-04 DIAGNOSIS — E66813 Obesity, class 3: Secondary | ICD-10-CM

## 2024-03-04 DIAGNOSIS — D649 Anemia, unspecified: Secondary | ICD-10-CM

## 2024-03-04 DIAGNOSIS — Z7409 Other reduced mobility: Secondary | ICD-10-CM | POA: Diagnosis not present

## 2024-03-04 DIAGNOSIS — R0602 Shortness of breath: Secondary | ICD-10-CM

## 2024-03-04 DIAGNOSIS — R32 Unspecified urinary incontinence: Secondary | ICD-10-CM

## 2024-03-04 DIAGNOSIS — N3000 Acute cystitis without hematuria: Secondary | ICD-10-CM | POA: Diagnosis not present

## 2024-03-04 DIAGNOSIS — I50810 Right heart failure, unspecified: Secondary | ICD-10-CM | POA: Diagnosis not present

## 2024-03-04 DIAGNOSIS — I10 Essential (primary) hypertension: Secondary | ICD-10-CM

## 2024-03-04 DIAGNOSIS — N390 Urinary tract infection, site not specified: Secondary | ICD-10-CM

## 2024-03-04 LAB — POCT URINE DIPSTICK
Bilirubin, UA: NEGATIVE
Blood, UA: NEGATIVE
Glucose, UA: NEGATIVE mg/dL
Nitrite, UA: NEGATIVE
POC PROTEIN,UA: 30 — AB
Spec Grav, UA: 1.01 (ref 1.010–1.025)
Urobilinogen, UA: 0.2 U/dL
pH, UA: 6 (ref 5.0–8.0)

## 2024-03-04 MED ORDER — CEPHALEXIN 500 MG PO CAPS: 500.0000 mg | ORAL_CAPSULE | Freq: Three times a day (TID) | ORAL | 0 refills | Status: DC

## 2024-03-04 NOTE — Assessment & Plan Note (Signed)
Labs repeat.

## 2024-03-04 NOTE — Assessment & Plan Note (Signed)
 Increased fatigue and weakness strong symptoms. Unclear if these are related to thyroid  dysfunction or possible other etiology.  We will monitor labs today to see where these are.  No changes in medication at this time. -Monitor labs for thyroid  function today -Consider medication change if indicated

## 2024-03-04 NOTE — Assessment & Plan Note (Signed)
 Labs pending today. Consider utilization of Mounjaro if A1c >6.4%.

## 2024-03-04 NOTE — Assessment & Plan Note (Signed)
 No alarm symptoms present today to indicate worsening HF. Lungs clear and no signs of fluid volume overload. Labs pending.

## 2024-03-04 NOTE — Assessment & Plan Note (Signed)
 Chronic lymphedema with significant improvement in the right leg following treatment at the lymphedema clinic. She experiences skin tenderness without signs of infection, erythema, warmth, or skin breakdown. She requires assistance with leg wrap today. She has her supplies with her. Concerns include tenderness and protection from skin breakdown.  - Continue with the lymphedema clinic program - Refer to home health for assistance with leg wrapping -Monitor the skin closely before wrapping and after removal for any signs of redness, warmth, or broken skin.  - Ensure leg wraps are applied for 23 hours daily with one hour off - Keep legs elevated when not walking.

## 2024-03-04 NOTE — Assessment & Plan Note (Signed)
 Recurrent UTI with dysuria and burning pain. No fever or chills. Weakness and fatigue likely exacerbated by the UTI. Plan includes addressing UTI and potential skin issues with a broad-spectrum antibiotic. - Prescribe an antibiotic to cover UTI and potential skin issues

## 2024-03-04 NOTE — Assessment & Plan Note (Signed)
 Interested in weight loss medication (Zepbound) with concerns about thyroid  function and potential deficiencies. Uncertainty about Medicare coverage for Zepbound; alternatives like Mounjaro may be considered if diabetes is indicated. - Check A1c and blood sugar levels to assess for diabetes - Evaluate thyroid  function, iron, B12, and vitamin D  levels - Investigate Medicare coverage for Zepbound or Mounjaro

## 2024-03-04 NOTE — Assessment & Plan Note (Signed)
 Blood pressure is slightly above goal today however she is significantly struggling with weakness, fatigue, and ambulation at this time which have likely triggered this increase.  Will not make any changes to medications today but do recommend monitoring blood pressure at home to ensure that this is staying within recommended parameters less than 130/85. -Monitor blood pressures at home -If blood pressures are consistently greater than 130/85 please send us  a note -Will plan to make changes as needed -Continue with valsartan  180 mg once daily

## 2024-03-04 NOTE — Assessment & Plan Note (Signed)
Labs repeated

## 2024-03-04 NOTE — Assessment & Plan Note (Signed)
 Lungs clear on examination today and no evidence of fluid volume overload. Suspect ongoing deconditioning and weakness from previous illness are contributing to the current symptoms. If this conditions, recommend pulmonology evaluation for spirometry.

## 2024-03-04 NOTE — Progress Notes (Signed)
 Natasha FORBES Doing, DNP, AGNP-c [facility] [address] [place], [place] [address] [phone]   ACUTE VISIT- ESTABLISHED PATIENT  Blood pressure (!) 140/88, pulse 69, weight (!) 310 lb (140.6 kg), SpO2 96%.  Subjective:  HPI History of Present Illness Natasha [name] is a 73 year old female who presents with multiple symptoms including weakness, fatigue, lymphedema, ambulatory difficulties, and potential UTI with dysuria.  She has been experiencing significant weakness and fatigue, which she attributes to a recent bout of influenza A and pneumonia while [place]. for two months. During that time, she also had a UTI, which she believes has contributed to her current state of weakness and fatigue. She feels unable to perform her usual activities and since returning home has struggled to manage alone.   She has a history of lymphedema and is struggling with changing the wraps on her legs daily. Her skin is very tender to the touch, and she is concerned about soreness. She has been attending a lymphedema clinic twice a week and reports a noticeable difference in her leg's condition, stating it feels lighter, and she can walk a bit better. However, she still requires assistance with walking, using a cane or walker, and feels sad about her decreased mobility. Her mobility and ability to function independently has declined over the last few months. At this time her right leg is the only leg being wrapped.   She mentions experiencing dysuria and a nagging, burning pain. No fevers or chills. She has a history of recurrent UTIs, with the last one occurring in May.  She lives alone and needs assistance with daily activities, including transportation and leg wrapping, as she cannot drive with her foot bandaged. She is interested in home health services that may help with wrapping her legs daily. She would also like to have PT in the home given that she is unable to drive, but  is unable to walk unassisted, even with her walker. She also reports that her house is not set up for handicap living- she is in need of railings and grab bars to help navigate in and out of the home. She reports that she struggles with housekeeping and is in need of help with that service, as well, to make the home more accessible and safe.   She asks about zepbound for weight to see if this is helpful for her to lose weight to see if this helps with her mobility. She has been prescribed ozempic  in the past, but did not start this, likely due to insurance coverage issues.   She is concerned about her thyroid  function, noting past fluctuations in medication dosages, and wonders if it might be contributing to her current symptoms. She also mentions previous low levels of iron, B12, and vitamin D .  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    ROS negative except for what is listed in HPI. History, Medications, Surgery, SDOH, and Family History reviewed and updated as appropriate.  Objective:  Physical Exam Vitals and nursing note reviewed.  Constitutional:      Appearance: She is obese.  HENT:     Head: Normocephalic.  Eyes:     Conjunctiva/sclera: Conjunctivae normal.  Cardiovascular:     Rate and Rhythm: Normal rate and regular rhythm.     Pulses: Normal pulses.     Heart sounds: Normal heart sounds.  Pulmonary:     Effort: Pulmonary effort is normal.     Breath sounds: Normal breath sounds.  Abdominal:     General: There is no distension.     Palpations: Abdomen is soft.     Tenderness: There is no abdominal tenderness. There is right CVA tenderness. There is no left CVA tenderness or guarding.  Musculoskeletal:        General: Swelling and tenderness present.     Right lower leg: Edema present.     Left lower leg: Edema present.  Skin:    General: Skin  is warm and dry.     Capillary Refill: Capillary refill takes less than 2 seconds.  Neurological:     Mental Status: She is alert and oriented to person, place, and time.     Sensory: No sensory deficit.     Motor: Weakness present.     Coordination: Coordination abnormal.     Gait: Gait abnormal.  Psychiatric:        Mood and Affect: Mood normal.        Behavior: Behavior normal.         Assessment & Plan:   Problem List Items Addressed This Visit     Hypothyroidism, postsurgical   Increased fatigue and weakness strong symptoms. Unclear if these are related to thyroid  dysfunction or possible other etiology.  We will monitor labs today to see where these are.  No changes in medication at this time. -Monitor labs for thyroid  function today -Consider medication change if indicated      Relevant Orders   AMB Referral VBCI Care Management   CBC with Differential/Platelet   Comprehensive metabolic panel with GFR   TSH   T4, free   Essential hypertension   Blood pressure is slightly above goal today however she is significantly struggling with weakness, fatigue, and ambulation at this time which have likely triggered this increase.  Will not make any changes to medications today but do recommend monitoring blood pressure at home to ensure that this is staying within recommended parameters less than 130/85. -Monitor blood pressures at home -If blood pressures are consistently greater than 130/85 please send us  a note -Will plan to make changes as needed -Continue with valsartan  180 mg once daily      Right-sided heart failure (HCC)   No alarm symptoms present today to indicate worsening HF. Lungs clear and no signs of fluid volume overload. Labs pending.       Relevant Orders   AMB Referral VBCI Care Management   CBC with Differential/Platelet   Comprehensive metabolic panel with GFR   Vitamin D  deficiency   Labs repeated.       Relevant Orders   CBC with  Differential/Platelet   Comprehensive metabolic panel with GFR   VITAMIN D  25 Hydroxy (Vit-D Deficiency, Fractures)   B12 deficiency   Labs repeat.       Relevant Orders   CBC with Differential/Platelet   Comprehensive metabolic panel with GFR   Vitamin B12   SOB (shortness of breath) on exertion   Lungs clear on examination today and no evidence of fluid volume overload. Suspect ongoing deconditioning and weakness from previous illness are contributing to the current symptoms. If this conditions, recommend pulmonology evaluation for spirometry.       Prediabetes   Labs pending today. Consider utilization of Mounjaro if A1c >6.4%.       Relevant Orders   AMB Referral VBCI Care Management   Hemoglobin  A1c   CBC with Differential/Platelet   Comprehensive metabolic panel with GFR   Recurrent urinary tract infection   Recurrent UTI with dysuria and burning pain. No fever or chills. Weakness and fatigue likely exacerbated by the UTI. Plan includes addressing UTI and potential skin issues with a broad-spectrum antibiotic. - Prescribe an antibiotic to cover UTI and potential skin issues      Relevant Medications   cephALEXin  (KEFLEX ) 500 MG capsule   Mood changes   Reports depression due to decreased mobility and independence, with sadness related to current health status and its impact on daily life. Acknowledgment of her feelings and encouragement provided that we are working to help improve this situation. -Ref 2304 for discussion on need for therapy services.       Relevant Orders   AMB Referral VBCI Care Management   Mobility impaired - Primary   Significant deconditioning and fatigue following recent flu A and pneumonia. Reports weakness and decreased mobility, requiring assistance with walking and transportation due to leg wraps. We discussed home health and physical therapy. She prefers at home therapy, if possible, but would like to go to [place] for PT if she cannot get  home health services for that.  - Refer to physical therapy for strength and mobility improvement - Explore possibility of in-home physical therapy services - Referral to SW to help with transportation assistance for medical appointments      Relevant Orders   AMB Referral VBCI Care Management   Ambulatory referral to Physical Therapy   Lymphedema   Chronic lymphedema with significant improvement in the right leg following treatment at the lymphedema clinic. She experiences skin tenderness without signs of infection, erythema, warmth, or skin breakdown. She requires assistance with leg wrap today. She has her supplies with her. Concerns include tenderness and protection from skin breakdown.  - Continue with the lymphedema clinic program - Refer to home health for assistance with leg wrapping -Monitor the skin closely before wrapping and after removal for any signs of redness, warmth, or broken skin.  - Ensure leg wraps are applied for 23 hours daily with one hour off - Keep legs elevated when not walking.       Relevant Orders   Ambulatory referral to Physical Therapy   Obesity, Class III, BMI 40-49.9 (morbid obesity)   Interested in weight loss medication (Zepbound) with concerns about thyroid  function and potential deficiencies. Uncertainty about Medicare coverage for Zepbound; alternatives like Mounjaro may be considered if diabetes is indicated. - Check A1c and blood sugar levels to assess for diabetes - Evaluate thyroid  function, iron, B12, and vitamin D  levels - Investigate Medicare coverage for Zepbound or Mounjaro      Relevant Orders   Ambulatory referral to Physical Therapy   Anemia   In the setting of severe fatigue and weakness, monitoring for B12 and iron deficiency, as these have been issues in the past.       Relevant Orders   CBC with Differential/Platelet   Comprehensive metabolic panel with GFR   Iron, TIBC and Ferritin Panel   Incontinence of urine in female    Relevant Orders   AMB Referral VBCI Care Management   POCT URINE DIPSTICK (Completed)   Other Visit Diagnoses       Acute cystitis without hematuria       Relevant Medications   cephALEXin  (KEFLEX ) 500 MG capsule   Other Relevant Orders   AMB Referral VBCI Care Management   Urine Culture  Morbid obesity (HCC)             Natasha FORBES Doing, DNP, AGNP-c Time: 62 minutes, >50% spent counseling, care coordination, chart review, and documentation.

## 2024-03-04 NOTE — Assessment & Plan Note (Signed)
 In the setting of severe fatigue and weakness, monitoring for B12 and iron deficiency, as these have been issues in the past.

## 2024-03-04 NOTE — Assessment & Plan Note (Signed)
 Significant deconditioning and fatigue following recent flu A and pneumonia. Reports weakness and decreased mobility, requiring assistance with walking and transportation due to leg wraps. We discussed home health and physical therapy. She prefers at home therapy, if possible, but would like to go to [place] for PT if she cannot get home health services for that.  - Refer to physical therapy for strength and mobility improvement - Explore possibility of in-home physical therapy services - Referral to SW to help with transportation assistance for medical appointments

## 2024-03-04 NOTE — Assessment & Plan Note (Signed)
 Reports depression due to decreased mobility and independence, with sadness related to current health status and its impact on daily life. Acknowledgment of her feelings and encouragement provided that we are working to help improve this situation. -Ref 2304 for discussion on need for therapy services.

## 2024-03-04 NOTE — Progress Notes (Signed)
 Complex Care Management Note Care Guide Note  03/04/2024 Name: Natasha [name] MRN: [id number] DOB: 04/19/1951   Complex Care Management Outreach Attempts: A third unsuccessful outreach was attempted today to offer the patient with information about available complex care management services.  Follow Up Plan:  No further outreach attempts will be made at this time. We have been unable to contact the patient to offer or enroll patient in complex care management services.  Encounter Outcome:  No Answer  Leotis Rase [facility], [facility] Guide  Direct Dial: [phone]  Fax [phone]

## 2024-03-05 LAB — CBC WITH DIFFERENTIAL/PLATELET
Basophils Absolute: 0.1 x10E3/uL (ref 0.0–0.2)
Basos: 1 %
EOS (ABSOLUTE): 0.2 x10E3/uL (ref 0.0–0.4)
Eos: 3 %
Hematocrit: 39.8 % (ref 34.0–46.6)
Hemoglobin: 12.9 g/dL (ref 11.1–15.9)
Immature Grans (Abs): 0 x10E3/uL (ref 0.0–0.1)
Immature Granulocytes: 0 %
Lymphocytes Absolute: 1.6 x10E3/uL (ref 0.7–3.1)
Lymphs: 25 %
MCH: 29.7 pg (ref 26.6–33.0)
MCHC: 32.4 g/dL (ref 31.5–35.7)
MCV: 92 fL (ref 79–97)
Monocytes Absolute: 0.8 x10E3/uL (ref 0.1–0.9)
Monocytes: 12 %
Neutrophils Absolute: 3.8 x10E3/uL (ref 1.4–7.0)
Neutrophils: 59 %
Platelets: 258 x10E3/uL (ref 150–450)
RBC: 4.34 x10E6/uL (ref 3.77–5.28)
RDW: 13.7 % (ref 11.7–15.4)
WBC: 6.5 x10E3/uL (ref 3.4–10.8)

## 2024-03-05 LAB — COMPREHENSIVE METABOLIC PANEL WITH GFR
ALT: 10 IU/L (ref 0–32)
AST: 14 IU/L (ref 0–40)
Albumin: 4.2 g/dL (ref 3.8–4.8)
Alkaline Phosphatase: 117 IU/L (ref 44–121)
BUN/Creatinine Ratio: 14 (ref 12–28)
BUN: 13 mg/dL (ref 8–27)
Bilirubin Total: 0.2 mg/dL (ref 0.0–1.2)
CO2: 18 mmol/L — ABNORMAL LOW (ref 20–29)
Calcium: 9.4 mg/dL (ref 8.7–10.3)
Chloride: 106 mmol/L (ref 96–106)
Creatinine, Ser: 0.96 mg/dL (ref 0.57–1.00)
Globulin, Total: 2.8 g/dL (ref 1.5–4.5)
Glucose: 76 mg/dL (ref 70–99)
Potassium: 4.4 mmol/L (ref 3.5–5.2)
Sodium: 144 mmol/L (ref 134–144)
Total Protein: 7 g/dL (ref 6.0–8.5)
eGFR: 62 mL/min/1.73 (ref >59)

## 2024-03-05 LAB — IRON,TIBC AND FERRITIN PANEL
Ferritin: 74 ng/mL (ref 15–150)
Iron Saturation: 21 % (ref 15–55)
Iron: 72 ug/dL (ref 27–139)
Total Iron Binding Capacity: 335 ug/dL (ref 250–450)
UIBC: 263 ug/dL (ref 118–369)

## 2024-03-05 LAB — HEMOGLOBIN A1C
Est. average glucose Bld gHb Est-mCnc: 111 mg/dL
Hgb A1c MFr Bld: 5.5 % (ref 4.8–5.6)

## 2024-03-05 LAB — VITAMIN D 25 HYDROXY (VIT D DEFICIENCY, FRACTURES): Vit D, 25-Hydroxy: 24.1 ng/mL — ABNORMAL LOW (ref 30.0–100.0)

## 2024-03-05 LAB — VITAMIN B12: Vitamin B-12: 598 pg/mL (ref 232–1245)

## 2024-03-05 LAB — TSH: TSH: 1.49 u[IU]/mL (ref 0.450–4.500)

## 2024-03-05 LAB — T4, FREE: Free T4: 1.5 ng/dL (ref 0.82–1.77)

## 2024-03-08 ENCOUNTER — Telehealth: Payer: Self-pay | Admitting: *Deleted

## 2024-03-08 LAB — URINE CULTURE

## 2024-03-08 NOTE — Progress Notes (Unsigned)
 Complex Care Management Note Care Guide Note  03/08/2024 Name: Natasha [name] MRN: [id number] DOB: [birth year]   Complex Care Management Outreach Attempts: An unsuccessful telephone outreach was attempted today to offer the patient information about available complex care management services.  Follow Up Plan:  Additional outreach attempts will be made to offer the patient complex care management information and services.   Encounter Outcome:  No Answer  Thedford Franks, CMA [place]  [facility], [facility] Guide Direct Dial: [phone]  Fax: [phone] Website: [email or web address]

## 2024-03-09 ENCOUNTER — Encounter: Payer: Self-pay | Admitting: Occupational Therapy

## 2024-03-09 ENCOUNTER — Ambulatory Visit: Admitting: Occupational Therapy

## 2024-03-09 DIAGNOSIS — M25561 Pain in right knee: Secondary | ICD-10-CM | POA: Diagnosis not present

## 2024-03-09 DIAGNOSIS — R260 Ataxic gait: Secondary | ICD-10-CM | POA: Diagnosis not present

## 2024-03-09 DIAGNOSIS — M25562 Pain in left knee: Secondary | ICD-10-CM | POA: Diagnosis not present

## 2024-03-09 DIAGNOSIS — E66813 Obesity, class 3: Secondary | ICD-10-CM | POA: Diagnosis not present

## 2024-03-09 DIAGNOSIS — R262 Difficulty in walking, not elsewhere classified: Secondary | ICD-10-CM | POA: Diagnosis not present

## 2024-03-09 DIAGNOSIS — I890 Lymphedema, not elsewhere classified: Secondary | ICD-10-CM | POA: Diagnosis not present

## 2024-03-09 DIAGNOSIS — M6281 Muscle weakness (generalized): Secondary | ICD-10-CM | POA: Diagnosis not present

## 2024-03-09 DIAGNOSIS — R2689 Other abnormalities of gait and mobility: Secondary | ICD-10-CM | POA: Diagnosis not present

## 2024-03-09 DIAGNOSIS — Z7409 Other reduced mobility: Secondary | ICD-10-CM | POA: Diagnosis not present

## 2024-03-09 DIAGNOSIS — G8929 Other chronic pain: Secondary | ICD-10-CM | POA: Diagnosis not present

## 2024-03-09 NOTE — Therapy (Unsigned)
 OUTPATIENT OCCUPATIONAL THERAPY TREATMENT NOTE  LOWER EXTREMITY LYMPHEDEMA  Patient Name: Natasha [name] MRN: [id number] DOB:[birth year], 73 y.o., female Today's Date: 03/09/2024  END OF SESSION:   .      Past Medical History:  Diagnosis Date   Abdominal spasms 12/14/2020   Acute pain of right shoulder 01/31/2023   Acute pulmonary embolism (HCC) 08/21/2019   Bacterial conjunctivitis of both eyes 12/14/2020   Bilateral leg edema 02/10/2020   Bradycardia    Breast cancer screening by mammogram 11/24/2020   Candida infection 10/11/2022   CARCINOMA, THYROID  GLAND, PAPILLARY 05/04/2008   Stage 2, 8/09: thyroidectomy for 2.7cm papillary adenocarcinoma (t2 n0 mo) 9/09: I-131 rx, 108 [facility]ci 05/10: tg is neg (ab neg) , total body scan is neg   Chronic right-sided low back pain with right-sided sciatica 11/24/2020   Colon cancer screening 11/24/2020   Colon polyps    Complication of anesthesia    trouble with airway   Cough 12/25/2007   Qualifier: Diagnosis of  By: Germaine LPN, Megan     RNCPI-80 08/19/2019   Decreased ROM of neck 01/31/2023   Diverticulosis    DVT (deep venous thrombosis) (HCC)    Edema 12/22/2008   Encounter to establish care 11/24/2020   Groin pain, chronic, right 11/24/2020   Health care maintenance 11/26/2022   Hip pain 12/22/2020   History of 2019 novel coronavirus disease (COVID-19) 11/09/2019   HYPERTENSION 12/25/2007   HYPOTHYROIDISM, POSTSURGICAL 05/04/2008   LEG PAIN, LEFT 12/09/2008   Low TSH level 12/10/2022   Lumbago with sciatica, right side 01/08/2021   Memory loss due to medical condition 11/09/2019   Muscle weakness    Nausea and vomiting 05/23/2008   Qualifier: Diagnosis of  By: Kassie MD, Alyce LABOR   Formatting of this note might be different from the original. Qualifier: Diagnosis of  By: Kassie MD, Sean A   Neck pain 01/31/2023   Non-recurrent acute suppurative otitis media of left ear without spontaneous rupture of tympanic membrane  02/17/2023   Numbness and tingling    Obesity, morbid, BMI 50 or higher (HCC) 08/24/2019   OSA (obstructive sleep apnea) 06/15/2013   CPAP   Paresthesia 01/04/2020   Peripheral edema 12/22/2008   Qualifier: Diagnosis of  By: Delford, MD, CODY Maude Dunnings    Pneumonia due to COVID-19 virus    Pulmonary embolism (HCC)    Pulmonary embolism (HCC) 08/23/2019   Right lower quadrant pain 11/24/2020   Sciatica of left side 12/04/2011   Skin sensation disturbance 12/05/2008   Qualifier: Diagnosis of  By: Inocencio MD, Berwyn LABOR Deal of this note might be different from the original. Qualifier: Diagnosis of  By: Inocencio MD, Valerie A   Snoring 12/10/2022   Spasm of muscle of lower back 12/14/2020   Upper back pain 01/31/2023   Vaginal candidiasis 03/16/2021   Weakness 01/04/2020   Past Surgical History:  Procedure Laterality Date   ABDOMINAL HYSTERECTOMY     due to bleeding, ovaries remain   ANKLE SURGERY Right    CERVICAL FUSION     x 2   COLONOSCOPY WITH PROPOFOL  N/A 08/08/2015   Procedure: COLONOSCOPY WITH PROPOFOL ;  Surgeon: Renaye Sous, MD;  Location: [facility];  Service: Endoscopy;  Laterality: N/A;   KNEE SURGERY Left    TOTAL THYROIDECTOMY     Patient Active Problem List   Diagnosis Date Noted   Anemia 03/04/2024   Chronic venous insufficiency 09/10/2023   Weakness of both lower extremities 09/10/2023  Atrophic vaginitis 05/14/2023   Pelvic floor dysfunction 05/14/2023   Pancreatic insufficiency 05/14/2023   Arthritis of knee 03/24/2023   At risk for fall due to comorbid condition 03/10/2023   Mood changes 02/17/2023   Recurrent urinary tract infection 01/31/2023   Walker as ambulation aid 01/31/2023   B12 deficiency 11/26/2022   Other fatigue 11/26/2022   Prediabetes 11/26/2022   Obesity, Class III, BMI 40-49.9 (morbid obesity) 11/26/2022   Dermatofibroma 10/11/2022   Fatty liver 04/20/2021   Incontinence of urine in female 04/20/2021   Vitamin D   deficiency 03/16/2021   Constipation 11/24/2020   Gastroesophageal reflux disease 11/24/2020   OAB (overactive bladder) 11/24/2020   Aortic atherosclerosis (HCC) 11/24/2020   Right-sided heart failure (HCC) 11/24/2020   History of pulmonary embolism 02/10/2020   Lymphedema 02/10/2020   Mobility impaired 01/04/2020   Muscle weakness (generalized) 11/09/2019   Pulmonary nodule 11/09/2019   OSA (obstructive sleep apnea) 06/15/2013   Allergic rhinitis 01/21/2009   History of papillary adenocarcinoma of thyroid  05/04/2008   Hypothyroidism, postsurgical 05/04/2008   Essential hypertension 12/25/2007   SOB (shortness of breath) on exertion 12/25/2007    PCP: Camie Doing, NP  REFERRING PROVIDER: Camie Doing, NP  REFERRING DIAG: I89.0  THERAPY DIAG:  Lymphedema, not elsewhere classified  Rationale for Evaluation and Treatment: Rehabilitation  ONSET DATE: >15 years  SUBJECTIVE:                                                                                                                                                                                          SUBJECTIVE STATEMENT: Ms. Lady presents to OT in manual transport wc for LE care. Pain is unchanged from initial eval. Pt is accompanied by her daughter and granddaughter. Pt has no new complaints. Pt performs wc transfers with extra time. Pt reports R leg swelling is much improved since last visit. Pt reports her adult daughter helped her wrap and it went well, but her daughter is unable to assist her daily. We discussed some local resources for adults needing assistance with some self care at home. Pt will research further through her local department on aging.  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 Right-sided heart failure (HCC) Hip pain Incontinence of urine in female Dermatofibroma Morbid obesity (HCC) BMI 50.0-59.9, adult (HCC) SOB (shortness of breath) on exertion Other fatigue Prediabetes Decreased ROM of neck Upper back pain Acute pain of right shoulder Recurrent urinary tract  infection Urine frequency Walker as ambulation aid Ambulatory dysfunction Mood changes At risk for fall due to comorbid condition Arthritis of knee Hypothyroidism Chronic venous insufficiency Mobility impaired Weakness of both lower extremities Gait instability Lymphedema BMI 45.0-49.9, adult (HCC)  PAIN:  Are you having pain? Yes, B knees: NPRS scale: 6/10 Pain location:  Pain description: creaky crackly, tight, heavy, sore, tender. With and without weight bearing Aggravating factors: standing, walking, weight bearing Relieving factors: walking  PRECAUTIONS: Other: LYMPHEDEMA PRECAUTIONS: prediabetic, hypothyroid,  RED FLAGS: Urinary incontinence; numbness and tingling in fingers, hands and toes   WEIGHT BEARING RESTRICTIONS: No  FALLS:  Has patient fallen in last 6 months? No  LIVING ENVIRONMENT: Lives with: lives with their daughter and  granddaughter Lives in: House/apartment Stairs: Yes; Internal: 14 steps; on right going up and External: garage 4 steps steps; can reach both Has following equipment at home: Walker - 4 wheeled, shower chair, and elevated toilet seat  OCCUPATION: retired Careers information officer professor  LEISURE: concerts, writing and producing plays  HAND DOMINANCE: right   PRIOR LEVEL OF FUNCTION: Independent  PATIENT GOALS: walk unassisted; lose weight   OBJECTIVE: Note: Objective measures were completed at Evaluation unless otherwise noted.  COGNITION:  Overall cognitive status: Within functional limits for tasks assessed   OBSERVATIONS / OTHER ASSESSMENTS:   POSTURE: head forward,  slight shoulder protraction  LE ROM: Limited at hips knees and ankles 2/2 body habitus and skin approximation  LE MMT: generalized weakness  LYMPHEDEMA ASSESSMENTS:   BLE COMPARATIVE LIMB VOLUMETRICS: Initial 02/17/24  LANDMARK RIGHT   R LEG (A-D) 6530.2 ml  R THIGH (E-G) ml  R FULL LIMB (A-G) ml  Limb Volume differential (LVD)  %  Volume change since initial %  Volume change overall V  (Blank rows = not tested)  LANDMARK LEFT  L LEG (A-D) 6601.0  L  THIGH (E-G) ml  L  FULL LIMB (A-G) ml  Limb Volume differential (LVD)  1.08% L>R  Volume change since initial %  Volume change overall %  (Blank rows = not tested)      SKIN/TISSUE INTEGRITY: : Moderate, Stage  II, Bilateral Lower Extremity Lymphedema 2/2 CVI,  Obesity, and Mm weakness  Skin  Description Hyper-Keratosis Peau d' Orange Shiny Tight Fibrotic/ Indurated Fatty Hard Spongy/ boggy   x   x R>L x x x   Skin dry Flaky WNL Macerated   mildly      Color Redness Varicosities Blanching Hemosiderin Stain Mottled        x   Odor Malodorous Yeast Fungal infection  WNL      x   Temperature Warm Cool wnl    x     Pitting Edema   1+ 2+ 3+ 4+ Non-pitting   x         Girth Symmetrical Asymmetrical                   Distribution    R>L toes to groin, bilaterally    Stemmer Sign Positive Negative   +    Lymphorrhea History Of:  Present Absent     x    Wounds History Of Present Absent Venous Arterial Pressure Sheer   denies  x        Signs of Infection Redness Warmth Erythema Acute Swelling Drainage Borders                    Sensation Light Touch Deep pressure Hypersensitivity   In tact  Impaired In tact Impaired Absent Impaired   x  x  x     Nails WNL   Fungus nail dystrophy   TBA     Hair Growth Symmetrical Asymmetrical   TB Ax    Skin Creases Base of toes  Ankles   Base of Fingers knees       Abdominal pannus Thigh Lobules  Face/neck   x x  x       GAIT: Pt transported to clinic  in transport wheelchair Distance walked: Pt able to transfer out of wc and walk 4-5 steps using 2 wheeled walker to Rx bed with extra time (modified independent).  Assistive device utilized: Pt able to lift legs onto Rx bed, one at a time, using hands to actively assist Level of assistance: Modified independence Comments: Transfers  and bed mobility take an inordinate amount of time  LYMPHEDEMA LIFE IMPACT SCALE (LLIS): Initial: 86.76% (The extent to which LE-related problems impacted your life over the past week.)                                                                                                                           TREATMENT DATE: 02/12/24 OT evaluation Pt/ CG edu   PATIENT EDUCATION:  Continued Pt/ CG edu for lymphedema self care home program throughout session. Topics include outcome of comparative limb volumetrics- starting limb volume differentials (LVDs), technology and gradient techniques used for short stretch, multilayer compression wrapping, simple self-MLD, therapeutic lymphatic pumping exercises, skin/nail care, LE precautions, compression garment recommendations and specifications, wear and care schedule and compression garment donning / doffing w assistive devices. Discussed progress towards all OT goals since commencing CDT. Discussed detrimental impact of obesity on lower and upper extremity lymphedema over time. Reviewed OT goals for lymphedema care with Pt and discussed progress to date.  All questions answered to the Pt's satisfaction. Good return. Person educated: Patient and family Education method: Explanation, Demonstration, and Handouts Education comprehension: verbalized understanding, returned demonstration, verbal cues required, and needs further education  LYMPHEDEMA SELF-CARE HOME PROGRAM: BLE lymphatic pumping there ex- 1 set of 10 each element, in order. Hold 5. 2 x daily 2. Daily, short stretch, thigh length, multilayer compression bandages  during Intensive Phase CDT 3. During self-Management Phase fit with appropriate compression garments and/ or devices 3. Daily skin care with low ph lotion matching skin ph 4. Daily simple self MLD   ASSESSMENT:  CLINICAL IMPRESSION:  Continued manual therapy today. Pt tolerated RLE/RLQ MLD without increased pain or sob. Pt will need daily caregiver assistance with compression and MLD in order to achieve volume reduction, and all OT goals for CDT.  Daughter is not available daily as planned, so Pt will research available services in [place]. Cont as per POC.  (02/12/24 Initial OT Eval : AMARYS SLIWINSKI is a 73 yo female presenting with moderate, stage  II, bilateral lower extremity lymphedema 2/2 CVI,  obesity, and mm weakness. She will benefit  from skilled Occupational Therapy to reduce limb swelling and associated pain, to limit progression and infection risk. BLE lymphedema limits functional performance in all occupational domains, including functional mobility and ambulation,  basic and instrumental ADLs, productive activities, leisure pursuits, social participation and quality of life. This patient will benefit from modified Intensive and Self Management Phase CDT . In addition to MLD, ther ex, skin care and compression bandaging below the knees, Pt will be fitted with custom knee length compression stockings paired with off the shelf , Capri length compression leggings. If insurance benefits allow, she may undergo a trial in the clinic with the advanced Flexitouch device in an effort to reduce discomfort and reduce infection risk. Without skilled OT Li-lymphedema will worsen over time and further functional decline is expected.    Ms Schraeder is unable to reach feet and distal legs to apply multilayer , gradient compression wraps during the Intensive Phase of CDT.  She understands this limitation and agrees to  arrange for a caregiver to assist her daily with compression wrapping between OT visits. With  a caregiver to assist her with Intensive Phase compression her prognosis is fair. Without CG assistance her prognosis is poor.)  OBJECTIVE IMPAIRMENTS: decreased activity tolerance, decreased balance, decreased endurance, decreased knowledge of condition, decreased knowledge of use of DME, decreased mobility, difficulty walking, decreased ROM, decreased strength, increased edema, impaired UE functional use, postural dysfunction, obesity, pain, and chronic leg swelling.   ACTIVITY LIMITATIONS: ACTIVITY LIMITATIONS: Mobility and functional ambulation limitations:  abnormal gait pattern, difficulty walking, carrying, lifting, bending, sitting, standing, squatting, stairs, transfers, and bed mobility Basic and instrumental ADLs (reaching feet and distal legs to groom nails, inspect skin, apply lotion, bathe lower body, difficulty with LB dressing, including fitting LB clothing, shoes and socks, impaired sleeping, meal prep, standing to cook, driving, shopping, yard work, house work Paediatric nurse activities: work related activities requiring extended standing, walking, sitting; caring for others Social participation in the community, socializing with others, LE affects body image  Leisure pursuits requiring extended standing, walking, sitting  PERSONAL FACTORS: Fitness, Time since onset of injury/illness/exacerbation, and 3+ comorbidities: Obesity, arthritis, OSA are also affecting patient's functional outcome.   REHAB POTENTIAL: Fair with daily caregiver assistance with gradient compression wrapping. Without assistance prognosis is poor as Pt is unable to apply wraps independently  EVALUATION COMPLEXITY: Moderate   GOALS: Goals reviewed with patient? Yes   SHORT TERM GOALS: Target date: 4th OT Rx visit  Pt will demonstrate understanding of lymphedema precautions and prevention strategies with modified independence using a printed reference to identify at least 5 precautions and discussing how s/he may  implement them into daily life to reduce risk of progression with modified assistance Baseline: max a Goal status: INITIAL   2.  With Max caregiver assistance Pt will be able to apply multilayer, knee length, compression wraps using gradient techniques to decrease limb volume, to limit infection risk, and to limit lymphedema progression.  Given this patient's Intake score of tbd % on the Lymphedema Life Impact Scale (LLIS), patient will experience a reduction of at least 5% in her perceived level of functional impairment resulting from lymphedema to improve functional performance and quality of life (QOL). Baseline: Dependent Goal status: INITIAL     LONG TERM GOALS: Target date: 05/13/24 Given this patient's Intake score of 86.76 % on the Lymphedema Life Impact Scale (LLIS), patient will experience a reduction of at least 10% in her perceived level of functional impairment resulting from lymphedema to improve  functional performance and quality of life (QOL).Baseline: tbd Baseline: max a Goal status: INITIAL     2.  With modified independence (extra time and assistive devices) Pt will be able to don and doff appropriate compression garments and/or devices to control BLE lymphedema and to limit progression.  Baseline: Dependent Goal status: INITIAL   3. Pt will achieve at least a 10% volume reductions bilaterally below the knees to return limb to more typical size and shape, to limit infection risk and LE progression, to decrease pain, to improve function, and to improve body image and QOL. Baseline: Dependent Goal status: INITIAL   4. Pt will achieve and sustain at least 85% compliance with all LE self-care home program components throughout CDT, including modified simple self-MLD, daily skin care and inspection, lymphatic pumping the ex and appropriate compression to limit lymphedema progression and to limit further functional decline. Baseline: Dependent. (Note: W/out daily assistance with  donning/ doffing compression  , prognosis is poor.) Goal status: INITIAL   PLAN:  OT FREQUENCY: 2x/week and PRN  OT DURATION: other: 18 weeks and PRN  PLANNED INTERVENTIONS:  Complete Decongestive Therapy (CDT): Manual Lymphatic Drainage (MLD) , Skin care, ther ex, gradient compression 97110-Therapeutic exercises, 97530- Therapeutic activity, 97535- Self Care, [id number]- Manual therapy, Patient/Family education, Manual lymph drainage, Compression bandaging, DME instructions, and trial with advanced, sequential, pneumatic, compression device (Tactile Medical Flexitouch) Pt will arrange for a caregiver to assist her daily with compression wrapping between OT visits.  Custom-made gradient compression garments and HOS devices are medically necessary because they are uniquely sized and shaped to fit the exact dimensions of the affected extremities, and to provide appropriate medical grade, graduated compression essential for optimally managing chronic, progressive lymphedema. Multiple custom compression garments are needed to ensure proper hygiene to limit infection risk. Custom compression garments should be replaced q 3-6 months When worn consistently for optimal lipo-lymphedema self-management over time. HOS devices, medically necessary to limit fibrosis buildup in tissue, should be replaced q 2 years and PRN when worn out.   PLAN FOR NEXT SESSION:  Pt/ caregiver edu Knee length, Multilayer compression wraps to one leg only-Pt's choice.  Zebedee Dec, MS, OTR/L, CLT-LANA 03/09/24 2:23 PM'

## 2024-03-09 NOTE — Progress Notes (Unsigned)
 Complex Care Management Note Care Guide Note  03/09/2024 Name: Natasha [name] MRN: [id number] DOB: 1951/02/07   Complex Care Management Outreach Attempts: A second unsuccessful outreach was attempted today to offer the patient with information about available complex care management services.  Follow Up Plan:  Additional outreach attempts will be made to offer the patient complex care management information and services.   Encounter Outcome:  No Answer  Thedford Franks, CMA [place]  [facility], [facility] Guide Direct Dial: [phone]  Fax: [phone] Website: [email or web address]

## 2024-03-09 NOTE — Therapy (Signed)
 OUTPATIENT OCCUPATIONAL THERAPY TREATMENT NOTE  LOWER EXTREMITY LYMPHEDEMA  Patient Name: Natasha [name] MRN: [id number] DOB:01-Mar-1951, 73 y.o., female Today's Date: 03/09/2024  END OF SESSION:  .      Past Medical History:  Diagnosis Date   Abdominal spasms 12/14/2020   Acute pain of right shoulder 01/31/2023   Acute pulmonary embolism (HCC) 08/21/2019   Bacterial conjunctivitis of both eyes 12/14/2020   Bilateral leg edema 02/10/2020   Bradycardia    Breast cancer screening by mammogram 11/24/2020   Candida infection 10/11/2022   CARCINOMA, THYROID  GLAND, PAPILLARY 05/04/2008   Stage 2, 8/09: thyroidectomy for 2.7cm papillary adenocarcinoma (t2 n0 mo) 9/09: I-131 rx, 108 [facility] 05/10: tg is neg (ab neg) , total body scan is neg   Chronic right-sided low back pain with right-sided sciatica 11/24/2020   Colon cancer screening 11/24/2020   Colon polyps    Complication of anesthesia    trouble with airway   Cough 12/25/2007   Qualifier: Diagnosis of  By: Germaine LPN, Megan     RNCPI-80 08/19/2019   Decreased ROM of neck 01/31/2023   Diverticulosis    DVT (deep venous thrombosis) (HCC)    Edema 12/22/2008   Encounter to establish care 11/24/2020   Groin pain, chronic, right 11/24/2020   Health care maintenance 11/26/2022   Hip pain 12/22/2020   History of 2019 novel coronavirus disease (COVID-19) 11/09/2019   HYPERTENSION 12/25/2007   HYPOTHYROIDISM, POSTSURGICAL 05/04/2008   LEG PAIN, LEFT 12/09/2008   Low TSH level 12/10/2022   Lumbago with sciatica, right side 01/08/2021   Memory loss due to medical condition 11/09/2019   Muscle weakness    Nausea and vomiting 05/23/2008   Qualifier: Diagnosis of  By: Kassie MD, Alyce LABOR   Formatting of this note might be different from the original. Qualifier: Diagnosis of  By: Kassie MD, Sean A   Neck pain 01/31/2023   Non-recurrent acute suppurative otitis media of left ear without spontaneous rupture of tympanic membrane  02/17/2023   Numbness and tingling    Obesity, morbid, BMI 50 or higher (HCC) 08/24/2019   OSA (obstructive sleep apnea) 06/15/2013   CPAP   Paresthesia 01/04/2020   Peripheral edema 12/22/2008   Qualifier: Diagnosis of  By: Delford, MD, CODY Maude Dunnings    Pneumonia due to COVID-19 virus    Pulmonary embolism (HCC)    Pulmonary embolism (HCC) 08/23/2019   Right lower quadrant pain 11/24/2020   Sciatica of left side 12/04/2011   Skin sensation disturbance 12/05/2008   Qualifier: Diagnosis of  By: Inocencio MD, Berwyn LABOR Deal of this note might be different from the original. Qualifier: Diagnosis of  By: Inocencio MD, Valerie A   Snoring 12/10/2022   Spasm of muscle of lower back 12/14/2020   Upper back pain 01/31/2023   Vaginal candidiasis 03/16/2021   Weakness 01/04/2020   Past Surgical History:  Procedure Laterality Date   ABDOMINAL HYSTERECTOMY     due to bleeding, ovaries remain   ANKLE SURGERY Right    CERVICAL FUSION     x 2   COLONOSCOPY WITH PROPOFOL  N/A 08/08/2015   Procedure: COLONOSCOPY WITH PROPOFOL ;  Surgeon: Renaye Sous, MD;  Location: [facility];  Service: Endoscopy;  Laterality: N/A;   KNEE SURGERY Left    TOTAL THYROIDECTOMY     Patient Active Problem List   Diagnosis Date Noted   Anemia 03/04/2024   Chronic venous insufficiency 09/10/2023   Weakness of both lower extremities 09/10/2023  Atrophic vaginitis 05/14/2023   Pelvic floor dysfunction 05/14/2023   Pancreatic insufficiency 05/14/2023   Arthritis of knee 03/24/2023   At risk for fall due to comorbid condition 03/10/2023   Mood changes 02/17/2023   Recurrent urinary tract infection 01/31/2023   Walker as ambulation aid 01/31/2023   B12 deficiency 11/26/2022   Other fatigue 11/26/2022   Prediabetes 11/26/2022   Obesity, Class III, BMI 40-49.9 (morbid obesity) 11/26/2022   Dermatofibroma 10/11/2022   Fatty liver 04/20/2021   Incontinence of urine in female 04/20/2021   Vitamin D   deficiency 03/16/2021   Constipation 11/24/2020   Gastroesophageal reflux disease 11/24/2020   OAB (overactive bladder) 11/24/2020   Aortic atherosclerosis (HCC) 11/24/2020   Right-sided heart failure (HCC) 11/24/2020   History of pulmonary embolism 02/10/2020   Lymphedema 02/10/2020   Mobility impaired 01/04/2020   Muscle weakness (generalized) 11/09/2019   Pulmonary nodule 11/09/2019   OSA (obstructive sleep apnea) 06/15/2013   Allergic rhinitis 01/21/2009   History of papillary adenocarcinoma of thyroid  05/04/2008   Hypothyroidism, postsurgical 05/04/2008   Essential hypertension 12/25/2007   SOB (shortness of breath) on exertion 12/25/2007    PCP: Camie Doing, NP  REFERRING PROVIDER: Camie Doing, NP  REFERRING DIAG: I89.0  THERAPY DIAG:  Lymphedema, not elsewhere classified  Rationale for Evaluation and Treatment: Rehabilitation  ONSET DATE: >15 years  SUBJECTIVE:                                                                                                                                                                                          SUBJECTIVE STATEMENT: Ms. Corliss presents to OT in manual transport wc for LE care. Pain is unchanged from initial eval. Pt is accompanied by her daughter and granddaughter. Pt performs wc transfers with extra time. Pt reports R leg swelling is much improved since last visit. Pt reports her adult daughter helped her wrap and it went well, but her daughter is unable to assist her daily. We discussed some local resources for adults needing assistance with some self care at home. Pt will research further through her doctor.  PERTINENT HISTORY:  Hypothyroidism, postsurgical Essential hypertension Dyspnea OSA (obstructive sleep apnea) Obesity, morbid, BMI 50 or higher (HCC) Muscle weakness (generalized) Gait abnormality History of pulmonary embolism Bilateral lower extremity edema Chronic right-sided low back pain with right-sided  sciatica Right-sided heart failure (HCC) Hip pain Incontinence of urine in female Dermatofibroma Morbid obesity (HCC) BMI 50.0-59.9, adult (HCC) SOB (shortness of breath) on exertion Other fatigue Prediabetes Decreased ROM of neck Upper back pain Acute pain of right shoulder Recurrent urinary tract infection Urine frequency Walker as ambulation aid Ambulatory  dysfunction Mood changes At risk for fall due to comorbid condition Arthritis of knee Hypothyroidism Chronic venous insufficiency Mobility impaired Weakness of both lower extremities Gait instability Lymphedema BMI 45.0-49.9, adult (HCC)  PAIN:  Are you having pain? Yes, B knees: NPRS scale: 6/10 Pain location:  Pain description: creaky crackly, tight, heavy, sore, tender. With and without weight bearing Aggravating factors: standing, walking, weight bearing Relieving factors: walking  PRECAUTIONS: Other: LYMPHEDEMA PRECAUTIONS: prediabetic, hypothyroid,  RED FLAGS: Urinary incontinence; numbness and tingling in fingers, hands and toes   WEIGHT BEARING RESTRICTIONS: No  FALLS:  Has patient fallen in last 6 months? No  LIVING ENVIRONMENT: Lives with: lives with their daughter and  granddaughter Lives in: House/apartment Stairs: Yes; Internal: 14 steps; on right going up and External: garage 4 steps steps; can reach both Has following equipment at home: Walker - 4 wheeled, shower chair, and elevated toilet seat  OCCUPATION: retired Careers information officer professor  LEISURE: concerts, writing and producing plays  HAND DOMINANCE: right   PRIOR LEVEL OF FUNCTION: Independent  PATIENT GOALS: walk unassisted; lose weight   OBJECTIVE: Note: Objective measures were completed at Evaluation unless otherwise noted.  COGNITION:  Overall cognitive status: Within functional limits for tasks assessed   OBSERVATIONS / OTHER ASSESSMENTS:   POSTURE: head forward, slight shoulder protraction  LE ROM: Limited at hips knees  and ankles 2/2 body habitus and skin approximation  LE MMT: generalized weakness  LYMPHEDEMA ASSESSMENTS:   BLE COMPARATIVE LIMB VOLUMETRICS: Initial 02/17/24  LANDMARK RIGHT   R LEG (A-D) 6530.2 ml  R THIGH (E-G) ml  R FULL LIMB (A-G) ml  Limb Volume differential (LVD)  %  Volume change since initial %  Volume change overall V  (Blank rows = not tested)  LANDMARK LEFT  L LEG (A-D) 6601.0  L  THIGH (E-G) ml  L  FULL LIMB (A-G) ml  Limb Volume differential (LVD)  1.08% L>R  Volume change since initial %  Volume change overall %  (Blank rows = not tested)      SKIN/TISSUE INTEGRITY: : Moderate, Stage  II, Bilateral Lower Extremity Lymphedema 2/2 CVI,  Obesity, and Mm weakness  Skin  Description Hyper-Keratosis Peau d' Orange Shiny Tight Fibrotic/ Indurated Fatty Hard Spongy/ boggy   x   x R>L x x x   Skin dry Flaky WNL Macerated   mildly      Color Redness Varicosities Blanching Hemosiderin Stain Mottled        x   Odor Malodorous Yeast Fungal infection  WNL      x   Temperature Warm Cool wnl    x     Pitting Edema   1+ 2+ 3+ 4+ Non-pitting   x         Girth Symmetrical Asymmetrical                   Distribution    R>L toes to groin, bilaterally    Stemmer Sign Positive Negative   +    Lymphorrhea History Of:  Present Absent     x    Wounds History Of Present Absent Venous Arterial Pressure Sheer   denies  x        Signs of Infection Redness Warmth Erythema Acute Swelling Drainage Borders                    Sensation Light Touch Deep pressure Hypersensitivity   In tact Impaired In tact Impaired Absent Impaired  x  x  x     Nails WNL   Fungus nail dystrophy   TBA     Hair Growth Symmetrical Asymmetrical   TB Ax    Skin Creases Base of toes  Ankles   Base of Fingers knees       Abdominal pannus Thigh Lobules  Face/neck   x x  x       GAIT: Pt transported to clinic in transport wheelchair Distance walked: Pt able to  transfer out of wc and walk 4-5 steps using 2 wheeled walker to Rx bed with extra time (modified independent).  Assistive device utilized: Pt able to lift legs onto Rx bed, one at a time, using hands to actively assist Level of assistance: Modified independence Comments: Transfers  and bed mobility take an inordinate amount of time  LYMPHEDEMA LIFE IMPACT SCALE (LLIS): Initial: 86.76% (The extent to which LE-related problems impacted your life over the past week.)                                                                                                                           TREATMENT DATE: 02/12/24 OT evaluation Pt/ CG edu   PATIENT EDUCATION:  Continued Pt/ CG edu for lymphedema self care home program throughout session. Topics include outcome of comparative limb volumetrics- starting limb volume differentials (LVDs), technology and gradient techniques used for short stretch, multilayer compression wrapping, simple self-MLD, therapeutic lymphatic pumping exercises, skin/nail care, LE precautions, compression garment recommendations and specifications, wear and care schedule and compression garment donning / doffing w assistive devices. Discussed progress towards all OT goals since commencing CDT. Discussed detrimental impact of obesity on lower and upper extremity lymphedema over time. Reviewed OT goals for lymphedema care with Pt and discussed progress to date.  All questions answered to the Pt's satisfaction. Good return. Person educated: Patient and family Education method: Explanation, Demonstration, and Handouts Education comprehension: verbalized understanding, returned demonstration, verbal cues required, and needs further education  LYMPHEDEMA SELF-CARE HOME PROGRAM: BLE lymphatic pumping there ex- 1 set of 10 each element, in order. Hold 5. 2 x daily 2. Daily, short stretch, thigh length, multilayer compression bandages during Intensive Phase CDT 3. During self-Management  Phase fit with appropriate compression garments and/ or devices 3. Daily skin care with low ph lotion matching skin ph 4. Daily simple self MLD   ASSESSMENT:  CLINICAL IMPRESSION:  Continued manual therapy today. Pt tolerated RLE/RLQ MLD without increased pain or sob. Pt will need daily caregiver assistance with compression and MLD in order to achieve volume reduction, and all OT goals for CDT.  Daughter is not available daily as planned, so Pt will  continue toresearch available services in [place]. Cont as per POC.  (02/12/24 Initial OT Eval : ANBERLYN FEIMSTER is a 73 yo female presenting with moderate, stage  II, bilateral lower extremity lymphedema 2/2 CVI,  obesity, and mm weakness. She will benefit from skilled Occupational Therapy to reduce  limb swelling and associated pain, to limit progression and infection risk. BLE lymphedema limits functional performance in all occupational domains, including functional mobility and ambulation,  basic and instrumental ADLs, productive activities, leisure pursuits, social participation and quality of life. This patient will benefit from modified Intensive and Self Management Phase CDT . In addition to MLD, ther ex, skin care and compression bandaging below the knees, Pt will be fitted with custom knee length compression stockings paired with off the shelf , Capri length compression leggings. If insurance benefits allow, she may undergo a trial in the clinic with the advanced Flexitouch device in an effort to reduce discomfort and reduce infection risk. Without skilled OT Li-lymphedema will worsen over time and further functional decline is expected.    Ms Janus is unable to reach feet and distal legs to apply multilayer , gradient compression wraps during the Intensive Phase of CDT.  She understands this limitation and agrees to  arrange for a caregiver to assist her daily with compression wrapping between OT visits. With a caregiver to assist her with Intensive  Phase compression her prognosis is fair. Without CG assistance her prognosis is poor.)  OBJECTIVE IMPAIRMENTS: decreased activity tolerance, decreased balance, decreased endurance, decreased knowledge of condition, decreased knowledge of use of DME, decreased mobility, difficulty walking, decreased ROM, decreased strength, increased edema, impaired UE functional use, postural dysfunction, obesity, pain, and chronic leg swelling.   ACTIVITY LIMITATIONS: ACTIVITY LIMITATIONS: Mobility and functional ambulation limitations:  abnormal gait pattern, difficulty walking, carrying, lifting, bending, sitting, standing, squatting, stairs, transfers, and bed mobility Basic and instrumental ADLs (reaching feet and distal legs to groom nails, inspect skin, apply lotion, bathe lower body, difficulty with LB dressing, including fitting LB clothing, shoes and socks, impaired sleeping, meal prep, standing to cook, driving, shopping, yard work, house work Paediatric nurse activities: work related activities requiring extended standing, walking, sitting; caring for others Social participation in the community, socializing with others, LE affects body image  Leisure pursuits requiring extended standing, walking, sitting  PERSONAL FACTORS: Fitness, Time since onset of injury/illness/exacerbation, and 3+ comorbidities: Obesity, arthritis, OSA are also affecting patient's functional outcome.   REHAB POTENTIAL: Fair with daily caregiver assistance with gradient compression wrapping. Without assistance prognosis is poor as Pt is unable to apply wraps independently  EVALUATION COMPLEXITY: Moderate   GOALS: Goals reviewed with patient? Yes   SHORT TERM GOALS: Target date: 4th OT Rx visit  Pt will demonstrate understanding of lymphedema precautions and prevention strategies with modified independence using a printed reference to identify at least 5 precautions and discussing how s/he may implement them into daily life to reduce  risk of progression with modified assistance Baseline: max a Goal status: INITIAL   2.  With Max caregiver assistance Pt will be able to apply multilayer, knee length, compression wraps using gradient techniques to decrease limb volume, to limit infection risk, and to limit lymphedema progression.  Given this patient's Intake score of tbd % on the Lymphedema Life Impact Scale (LLIS), patient will experience a reduction of at least 5% in her perceived level of functional impairment resulting from lymphedema to improve functional performance and quality of life (QOL). Baseline: Dependent Goal status: INITIAL     LONG TERM GOALS: Target date: 05/13/24 Given this patient's Intake score of 86.76 % on the Lymphedema Life Impact Scale (LLIS), patient will experience a reduction of at least 10% in her perceived level of functional impairment resulting from lymphedema to improve functional performance and quality of life (  QOL).Baseline: tbd Baseline: max a Goal status: INITIAL     2.  With modified independence (extra time and assistive devices) Pt will be able to don and doff appropriate compression garments and/or devices to control BLE lymphedema and to limit progression.  Baseline: Dependent Goal status: INITIAL   3. Pt will achieve at least a 10% volume reductions bilaterally below the knees to return limb to more typical size and shape, to limit infection risk and LE progression, to decrease pain, to improve function, and to improve body image and QOL. Baseline: Dependent Goal status: INITIAL   4. Pt will achieve and sustain at least 85% compliance with all LE self-care home program components throughout CDT, including modified simple self-MLD, daily skin care and inspection, lymphatic pumping the ex and appropriate compression to limit lymphedema progression and to limit further functional decline. Baseline: Dependent. (Note: W/out daily assistance with donning/ doffing compression  , prognosis  is poor.) Goal status: INITIAL   PLAN:  OT FREQUENCY: 2x/week and PRN  OT DURATION: other: 18 weeks and PRN  PLANNED INTERVENTIONS:  Complete Decongestive Therapy (CDT): Manual Lymphatic Drainage (MLD) , Skin care, ther ex, gradient compression 97110-Therapeutic exercises, 97530- Therapeutic activity, 97535- Self Care, [id number]- Manual therapy, Patient/Family education, Manual lymph drainage, Compression bandaging, DME instructions, and trial with advanced, sequential, pneumatic, compression device (Tactile Medical Flexitouch) Pt will arrange for a caregiver to assist her daily with compression wrapping between OT visits.  Custom-made gradient compression garments and HOS devices are medically necessary because they are uniquely sized and shaped to fit the exact dimensions of the affected extremities, and to provide appropriate medical grade, graduated compression essential for optimally managing chronic, progressive lymphedema. Multiple custom compression garments are needed to ensure proper hygiene to limit infection risk. Custom compression garments should be replaced q 3-6 months When worn consistently for optimal lipo-lymphedema self-management over time. HOS devices, medically necessary to limit fibrosis buildup in tissue, should be replaced q 2 years and PRN when worn out.   PLAN FOR NEXT SESSION:  Pt/ caregiver edu Knee length, Multilayer compression wraps to one leg only-Pt's choice.  Zebedee Dec, MS, OTR/L, CLT-LANA 03/09/24 2:28 PM'

## 2024-03-10 NOTE — Progress Notes (Signed)
 Complex Care Management Note Care Guide Note  03/10/2024 Name: Natasha [name] MRN: [id number] DOB: [birth year]   Complex Care Management Outreach Attempts: A third unsuccessful outreach was attempted today to offer the patient with information about available complex care management services.  Follow Up Plan:  No further outreach attempts will be made at this time. We have been unable to contact the patient to offer or enroll patient in complex care management services.  Encounter Outcome:  No Answer  Thedford Franks, CMA [place]  [facility], [facility] Guide Direct Dial: [phone]  Fax: [phone] Website: [email or web address]

## 2024-03-11 ENCOUNTER — Ambulatory Visit: Admitting: Physical Therapy

## 2024-03-11 ENCOUNTER — Ambulatory Visit: Admitting: Occupational Therapy

## 2024-03-16 ENCOUNTER — Encounter: Payer: Self-pay | Admitting: Nurse Practitioner

## 2024-03-17 ENCOUNTER — Ambulatory Visit: Admitting: Occupational Therapy

## 2024-03-17 ENCOUNTER — Encounter: Payer: Self-pay | Admitting: Physical Therapy

## 2024-03-17 ENCOUNTER — Ambulatory Visit: Admitting: Physical Therapy

## 2024-03-17 DIAGNOSIS — M6281 Muscle weakness (generalized): Secondary | ICD-10-CM

## 2024-03-17 DIAGNOSIS — R260 Ataxic gait: Secondary | ICD-10-CM | POA: Diagnosis not present

## 2024-03-17 DIAGNOSIS — I890 Lymphedema, not elsewhere classified: Secondary | ICD-10-CM

## 2024-03-17 DIAGNOSIS — M25561 Pain in right knee: Secondary | ICD-10-CM | POA: Diagnosis not present

## 2024-03-17 DIAGNOSIS — G8929 Other chronic pain: Secondary | ICD-10-CM | POA: Diagnosis not present

## 2024-03-17 DIAGNOSIS — Z7409 Other reduced mobility: Secondary | ICD-10-CM | POA: Diagnosis not present

## 2024-03-17 DIAGNOSIS — R2689 Other abnormalities of gait and mobility: Secondary | ICD-10-CM

## 2024-03-17 DIAGNOSIS — R262 Difficulty in walking, not elsewhere classified: Secondary | ICD-10-CM | POA: Diagnosis not present

## 2024-03-17 DIAGNOSIS — M25562 Pain in left knee: Secondary | ICD-10-CM | POA: Diagnosis not present

## 2024-03-17 NOTE — Therapy (Signed)
 OUTPATIENT OCCUPATIONAL THERAPY TREATMENT NOTE  BILATERAL LOWER EXTREMITY/ BLQ LYMPHEDEMA  Patient Name: Natasha [name] MRN: [id number] DOB:[birth year], 73 y.o., female Today's Date: 03/17/2024  END OF SESSION:  OT End of Session - 03/17/24 1526     Visit Number 7    Number of Visits 36    Date for OT Re-Evaluation 05/13/24    OT Start Time 0305    OT Stop Time 0409    OT Time Calculation (min) 64 min    Activity Tolerance Patient tolerated treatment well;No increased pain    Behavior During Therapy [facility] for tasks assessed/performed             Past Medical History:  Diagnosis Date   Abdominal spasms 12/14/2020   Acute pain of right shoulder 01/31/2023   Acute pulmonary embolism (HCC) 08/21/2019   Bacterial conjunctivitis of both eyes 12/14/2020   Bilateral leg edema 02/10/2020   Bradycardia    Breast cancer screening by mammogram 11/24/2020   Candida infection 10/11/2022   CARCINOMA, THYROID  GLAND, PAPILLARY 05/04/2008   Stage 2, 8/09: thyroidectomy for 2.7cm papillary adenocarcinoma (t2 n0 mo) 9/09: I-131 rx, 108 mci 05/10: tg is neg (ab neg) , total body scan is neg   Chronic right-sided low back pain with right-sided sciatica 11/24/2020   Colon cancer screening 11/24/2020   Colon polyps    Complication of anesthesia    trouble with airway   Cough 12/25/2007   Qualifier: Diagnosis of  By: Germaine LPN, Megan     RNCPI-80 08/19/2019   Decreased ROM of neck 01/31/2023   Diverticulosis    DVT (deep venous thrombosis) (HCC)    Edema 12/22/2008   Encounter to establish care 11/24/2020   Groin pain, chronic, right 11/24/2020   Health care maintenance 11/26/2022   Hip pain 12/22/2020   History of 2019 novel coronavirus disease (COVID-19) 11/09/2019   HYPERTENSION 12/25/2007   HYPOTHYROIDISM, POSTSURGICAL 05/04/2008   LEG PAIN, LEFT 12/09/2008   Low TSH level 12/10/2022   Lumbago with sciatica, right side 01/08/2021   Memory loss due to medical condition 11/09/2019    Muscle weakness    Nausea and vomiting 05/23/2008   Qualifier: Diagnosis of  By: Kassie MD, Alyce LABOR   Formatting of this note might be different from the original. Qualifier: Diagnosis of  By: Kassie MD, Sean A   Neck pain 01/31/2023   Non-recurrent acute suppurative otitis media of left ear without spontaneous rupture of tympanic membrane 02/17/2023   Numbness and tingling    Obesity, morbid, BMI 50 or higher (HCC) 08/24/2019   OSA (obstructive sleep apnea) 06/15/2013   CPAP   Paresthesia 01/04/2020   Peripheral edema 12/22/2008   Qualifier: Diagnosis of  By: Delford, MD, CODY Maude Dunnings    Pneumonia due to COVID-19 virus    Pulmonary embolism (HCC)    Pulmonary embolism (HCC) 08/23/2019   Right lower quadrant pain 11/24/2020   Sciatica of left side 12/04/2011   Skin sensation disturbance 12/05/2008   Qualifier: Diagnosis of  By: Inocencio MD, Berwyn LABOR Deal of this note might be different from the original. Qualifier: Diagnosis of  By: Inocencio MD, Valerie A   Snoring 12/10/2022   Spasm of muscle of lower back 12/14/2020   Upper back pain 01/31/2023   Vaginal candidiasis 03/16/2021   Weakness 01/04/2020   Past Surgical History:  Procedure Laterality Date   ABDOMINAL HYSTERECTOMY     due to bleeding, ovaries remain   ANKLE SURGERY Right  CERVICAL FUSION     x 2   COLONOSCOPY WITH PROPOFOL  N/A 08/08/2015   Procedure: COLONOSCOPY WITH PROPOFOL ;  Surgeon: Renaye Sous, MD;  Location: [facility];  Service: Endoscopy;  Laterality: N/A;   KNEE SURGERY Left    TOTAL THYROIDECTOMY     Patient Active Problem List   Diagnosis Date Noted   Anemia 03/04/2024   Chronic venous insufficiency 09/10/2023   Weakness of both lower extremities 09/10/2023   Atrophic vaginitis 05/14/2023   Pelvic floor dysfunction 05/14/2023   Pancreatic insufficiency 05/14/2023   Arthritis of knee 03/24/2023   At risk for fall due to comorbid condition 03/10/2023   Mood changes 02/17/2023    Recurrent urinary tract infection 01/31/2023   Walker as ambulation aid 01/31/2023   B12 deficiency 11/26/2022   Other fatigue 11/26/2022   Prediabetes 11/26/2022   Obesity, Class III, BMI 40-49.9 (morbid obesity) 11/26/2022   Dermatofibroma 10/11/2022   Fatty liver 04/20/2021   Incontinence of urine in female 04/20/2021   Vitamin D  deficiency 03/16/2021   Constipation 11/24/2020   Gastroesophageal reflux disease 11/24/2020   OAB (overactive bladder) 11/24/2020   Aortic atherosclerosis (HCC) 11/24/2020   Right-sided heart failure (HCC) 11/24/2020   History of pulmonary embolism 02/10/2020   Lymphedema 02/10/2020   Mobility impaired 01/04/2020   Muscle weakness (generalized) 11/09/2019   Pulmonary nodule 11/09/2019   OSA (obstructive sleep apnea) 06/15/2013   Allergic rhinitis 01/21/2009   History of papillary adenocarcinoma of thyroid  05/04/2008   Hypothyroidism, postsurgical 05/04/2008   Essential hypertension 12/25/2007   SOB (shortness of breath) on exertion 12/25/2007    PCP: Camie Doing, NP  REFERRING PROVIDER: Camie Doing, NP  REFERRING DIAG: I89.0  THERAPY DIAG:  Lymphedema, not elsewhere classified  Rationale for Evaluation and Treatment: Rehabilitation  ONSET DATE: >15 years  SUBJECTIVE:                                                                                                                                                                                          SUBJECTIVE STATEMENT: [name] presents to OT in manual transport wc for LE care.Pt was last seen  on 7/8 and missed a week of therapy due to illness. Pt tells me she has been able wrap daily with assistance from a family friend.  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 Right-sided heart failure (HCC) Hip pain Incontinence of urine in female Dermatofibroma Morbid obesity (HCC) BMI 50.0-59.9,  adult (HCC) SOB (shortness of breath) on exertion Other fatigue Prediabetes Decreased ROM of neck Upper back pain Acute pain of right shoulder Recurrent urinary tract infection Urine frequency Walker as ambulation aid Ambulatory dysfunction Mood changes At risk for fall due to comorbid condition Arthritis of knee Hypothyroidism Chronic venous insufficiency Mobility impaired Weakness of both lower extremities Gait instability Lymphedema BMI 45.0-49.9, adult (HCC)  PAIN:  Are you having pain? Yes, B knees: NPRS scale: 6/10 Pain location:  Pain description: creaky crackly, tight, heavy, sore, tender. With and without weight bearing Aggravating factors: standing, walking, weight bearing Relieving factors: walking  PRECAUTIONS: Other: LYMPHEDEMA PRECAUTIONS: prediabetic, hypothyroid,  RED FLAGS: Urinary incontinence; numbness and tingling in fingers, hands and toes   WEIGHT BEARING RESTRICTIONS: No  FALLS:  Has patient fallen in last 6 months? No  LIVING ENVIRONMENT: Lives with: lives with their daughter and  granddaughter Lives in: House/apartment Stairs: Yes; Internal: 14 steps; on right going up and External: garage 4 steps steps; can reach both Has following equipment at home: Walker - 4 wheeled, shower chair, and elevated toilet seat  OCCUPATION: retired Careers information officer professor  LEISURE: concerts, writing and producing plays  HAND DOMINANCE: right   PRIOR LEVEL OF FUNCTION: Independent  PATIENT GOALS: walk unassisted; lose weight   OBJECTIVE: Note: Objective measures were completed at Evaluation unless otherwise noted.  COGNITION:  Overall cognitive status: Within functional limits for tasks assessed   OBSERVATIONS / OTHER ASSESSMENTS:   POSTURE: head forward,  slight shoulder protraction  LE ROM: Limited at hips knees and ankles 2/2 body habitus and skin approximation  LE MMT: generalized weakness  LYMPHEDEMA ASSESSMENTS:   BLE COMPARATIVE LIMB VOLUMETRICS: Initial 02/17/24  LANDMARK RIGHT   R LEG (A-D) 6530.2 ml  R THIGH (E-G) ml  R FULL LIMB (A-G) ml  Limb Volume differential (LVD)  %  Volume change since initial %  Volume change overall V  (Blank rows = not tested)  LANDMARK LEFT  L LEG (A-D) 6601.0  L  THIGH (E-G) ml  L  FULL LIMB (A-G) ml  Limb Volume differential (LVD)  1.08% L>R  Volume change since initial %  Volume change overall %  (Blank rows = not tested)      SKIN/TISSUE INTEGRITY: : Moderate, Stage  II, Bilateral Lower Extremity Lymphedema 2/2 CVI,  Obesity, and Mm weakness  Skin  Description Hyper-Keratosis Peau d' Orange Shiny Tight Fibrotic/ Indurated Fatty Hard Spongy/ boggy   x   x R>L x x x   Skin dry Flaky WNL Macerated   mildly      Color Redness Varicosities Blanching Hemosiderin Stain Mottled        x   Odor Malodorous Yeast Fungal infection  WNL      x   Temperature Warm Cool wnl    x     Pitting Edema   1+ 2+ 3+ 4+ Non-pitting   x         Girth Symmetrical Asymmetrical                   Distribution    R>L toes to groin, bilaterally    Stemmer Sign Positive Negative   +    Lymphorrhea History Of:  Present Absent     x    Wounds History Of Present Absent Venous Arterial Pressure Sheer   denies  x        Signs of Infection Redness Warmth Erythema Acute Swelling Drainage Borders  Sensation Light Touch Deep pressure Hypersensitivity   In tact Impaired In tact Impaired Absent Impaired   x  x  x     Nails WNL   Fungus nail dystrophy   TBA     Hair Growth Symmetrical Asymmetrical   TB Ax    Skin Creases Base of toes  Ankles   Base of Fingers knees       Abdominal pannus Thigh Lobules  Face/neck   x x  x       GAIT: Pt transported to clinic  in transport wheelchair Distance walked: Pt able to transfer out of wc and walk 4-5 steps using 2 wheeled walker to Rx bed with extra time (modified independent).  Assistive device utilized: Pt able to lift legs onto Rx bed, one at a time, using hands to actively assist Level of assistance: Modified independence Comments: Transfers  and bed mobility take an inordinate amount of time  LYMPHEDEMA LIFE IMPACT SCALE (LLIS): Initial: 86.76% (The extent to which LE-related problems impacted your life over the past week.)                                                                                                                           TREATMENT DATE: 02/12/24 RLE/RLQ MLD Compression wrapping Pt/ CG edu   PATIENT EDUCATION:  Continued Pt/ CG edu for lymphedema self care home program throughout session. Topics include outcome of comparative limb volumetrics- starting limb volume differentials (LVDs), technology and gradient techniques used for short stretch, multilayer compression wrapping, simple self-MLD, therapeutic lymphatic pumping exercises, skin/nail care, LE precautions, compression garment recommendations and specifications, wear and care schedule and compression garment donning / doffing w assistive devices. Discussed progress towards all OT goals since commencing CDT. Discussed detrimental impact of obesity on lower and upper extremity lymphedema over time. Reviewed OT goals for lymphedema care with Pt and discussed progress to date.  All questions answered to the Pt's satisfaction. Good return. Person educated: Patient and family Education method: Explanation, Demonstration, and Handouts Education comprehension: verbalized understanding, returned demonstration, verbal cues required, and needs further education  LYMPHEDEMA SELF-CARE HOME PROGRAM: BLE lymphatic pumping there ex- 1 set of 10 each element, in order. Hold 5. 2 x daily 2. Daily, short stretch, thigh length, multilayer  compression bandages during Intensive Phase CDT 3. During self-Management Phase fit with appropriate compression garments and/ or devices 3. Daily skin care with low ph lotion matching skin ph 4. Daily simple self MLD   ASSESSMENT:  CLINICAL IMPRESSION:  RLE swelling is dramatically reduced since Pt last seen 8 days ago, even after missing 2 scheduled visit . Paid caregiver has done an excellent job w compression wrapping during interval and Pt demonstrates excellent progress. Skin is well hydrated. Ankle remains very swollen and fibrotic in area where hardware fixation is installed. Continued manual therapy today. Pt tolerated RLE/RLQ MLD without increased pain or sob. Demonstrated alternative compression leggings -adjustable Circaid s- since Pt is dependent  to  apply compression stockings. Pt interested in these alternatives. Applied multilayer compression wraps as established. All questions answered. Cont as per POC.  (02/12/24 Initial OT Eval : LYA HOLBEN is a 73 yo female presenting with moderate, stage  II, bilateral lower extremity lymphedema 2/2 CVI,  obesity, and mm weakness. She will benefit from skilled Occupational Therapy to reduce limb swelling and associated pain, to limit progression and infection risk. BLE lymphedema limits functional performance in all occupational domains, including functional mobility and ambulation,  basic and instrumental ADLs, productive activities, leisure pursuits, social participation and quality of life. This patient will benefit from modified Intensive and Self Management Phase CDT . In addition to MLD, ther ex, skin care and compression bandaging below the knees, Pt will be fitted with custom knee length compression stockings paired with off the shelf , Capri length compression leggings. If insurance benefits allow, she may undergo a trial in the clinic with the advanced Flexitouch device in an effort to reduce discomfort and reduce infection risk. Without  skilled OT Li-lymphedema will worsen over time and further functional decline is expected.    Ms Garay is unable to reach feet and distal legs to apply multilayer , gradient compression wraps during the Intensive Phase of CDT.  She understands this limitation and agrees to  arrange for a caregiver to assist her daily with compression wrapping between OT visits. With a caregiver to assist her with Intensive Phase compression her prognosis is fair. Without CG assistance her prognosis is poor.)  OBJECTIVE IMPAIRMENTS: decreased activity tolerance, decreased balance, decreased endurance, decreased knowledge of condition, decreased knowledge of use of DME, decreased mobility, difficulty walking, decreased ROM, decreased strength, increased edema, impaired UE functional use, postural dysfunction, obesity, pain, and chronic leg swelling.   ACTIVITY LIMITATIONS: ACTIVITY LIMITATIONS: Mobility and functional ambulation limitations:  abnormal gait pattern, difficulty walking, carrying, lifting, bending, sitting, standing, squatting, stairs, transfers, and bed mobility Basic and instrumental ADLs (reaching feet and distal legs to groom nails, inspect skin, apply lotion, bathe lower body, difficulty with LB dressing, including fitting LB clothing, shoes and socks, impaired sleeping, meal prep, standing to cook, driving, shopping, yard work, house work Paediatric nurse activities: work related activities requiring extended standing, walking, sitting; caring for others Social participation in the community, socializing with others, LE affects body image  Leisure pursuits requiring extended standing, walking, sitting  PERSONAL FACTORS: Fitness, Time since onset of injury/illness/exacerbation, and 3+ comorbidities: Obesity, arthritis, OSA are also affecting patient's functional outcome.   REHAB POTENTIAL: Fair with daily caregiver assistance with gradient compression wrapping. Without assistance prognosis is poor as Pt  is unable to apply wraps independently  EVALUATION COMPLEXITY: Moderate   GOALS: Goals reviewed with patient? Yes   SHORT TERM GOALS: Target date: 4th OT Rx visit  Pt will demonstrate understanding of lymphedema precautions and prevention strategies with modified independence using a printed reference to identify at least 5 precautions and discussing how s/he may implement them into daily life to reduce risk of progression with modified assistance Baseline: max a Goal status: INITIAL   2.  With Max caregiver assistance Pt will be able to apply multilayer, knee length, compression wraps using gradient techniques to decrease limb volume, to limit infection risk, and to limit lymphedema progression.  Given this patient's Intake score of tbd % on the Lymphedema Life Impact Scale (LLIS), patient will experience a reduction of at least 5% in her perceived level of functional impairment resulting from lymphedema to improve functional  performance and quality of life (QOL). Baseline: Dependent Goal status: INITIAL     LONG TERM GOALS: Target date: 05/13/24 Given this patient's Intake score of 86.76 % on the Lymphedema Life Impact Scale (LLIS), patient will experience a reduction of at least 10% in her perceived level of functional impairment resulting from lymphedema to improve functional performance and quality of life (QOL).Baseline: tbd Baseline: max a Goal status: INITIAL     2.  With modified independence (extra time and assistive devices) Pt will be able to don and doff appropriate compression garments and/or devices to control BLE lymphedema and to limit progression.  Baseline: Dependent Goal status: INITIAL   3. Pt will achieve at least a 10% volume reductions bilaterally below the knees to return limb to more typical size and shape, to limit infection risk and LE progression, to decrease pain, to improve function, and to improve body image and QOL. Baseline: Dependent Goal status:  INITIAL   4. Pt will achieve and sustain at least 85% compliance with all LE self-care home program components throughout CDT, including modified simple self-MLD, daily skin care and inspection, lymphatic pumping the ex and appropriate compression to limit lymphedema progression and to limit further functional decline. Baseline: Dependent. (Note: W/out daily assistance with donning/ doffing compression  , prognosis is poor.) Goal status: INITIAL   PLAN:  OT FREQUENCY: 2x/week and PRN  OT DURATION: other: 18 weeks and PRN  PLANNED INTERVENTIONS:  Complete Decongestive Therapy (CDT): Manual Lymphatic Drainage (MLD) , Skin care, ther ex, gradient compression [id number]-Therapeutic exercises, 97530- Therapeutic activity, 97535- Self Care, [id number]- Manual therapy, Patient/Family education, Manual lymph drainage, Compression bandaging, DME instructions, and trial with advanced, sequential, pneumatic, compression device (Tactile Medical Flexitouch) Pt will arrange for a caregiver to assist her daily with compression wrapping between OT visits.  Custom-made gradient compression garments and HOS devices are medically necessary because they are uniquely sized and shaped to fit the exact dimensions of the affected extremities, and to provide appropriate medical grade, graduated compression essential for optimally managing chronic, progressive lymphedema. Multiple custom compression garments are needed to ensure proper hygiene to limit infection risk. Custom compression garments should be replaced q 3-6 months When worn consistently for optimal lipo-lymphedema self-management over time. HOS devices, medically necessary to limit fibrosis buildup in tissue, should be replaced q 2 years and PRN when worn out.   PLAN FOR NEXT SESSION:  Pt/ caregiver edu Knee length, Multilayer compression wraps to one leg only-Pt's choice.  Zebedee Dec, MS, OTR/L, CLT-LANA 03/17/24 4:10 PM'
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
## 2024-03-18 ENCOUNTER — Telehealth: Payer: Self-pay

## 2024-03-18 NOTE — Telephone Encounter (Signed)
 Pt. Asking about Social worker and home health aid. I see two referrals in the system. They both tried to call her three times already. Can we reopen those referrals or can she call them back? On one referral I could see the persons contact information the other one I could not.

## 2024-03-18 NOTE — Telephone Encounter (Signed)
 Can you please help the pt. With these referrals in the system. Not sure if it can be reopened or if we need to put new ones in the system.   Copied from CRM [id number]. Topic: Clinical - Home Health Verbal Orders >> Mar 18, 2024  9:32 AM Silvana PARAS wrote: Pt calling to f/u on social worker to request [facility] services. Callback number is [phone]. It's ok to leave a voicemail due to robo phone block. Called clinic and they stated they tried calling pt 3x and then cancelled the referral, but will be resent to [facility] and coordinator will give pt a call.

## 2024-03-22 ENCOUNTER — Encounter: Payer: Self-pay | Admitting: Physical Therapy

## 2024-03-22 ENCOUNTER — Ambulatory Visit: Admitting: Physical Therapy

## 2024-03-22 DIAGNOSIS — R262 Difficulty in walking, not elsewhere classified: Secondary | ICD-10-CM | POA: Diagnosis not present

## 2024-03-22 DIAGNOSIS — M25561 Pain in right knee: Secondary | ICD-10-CM | POA: Diagnosis not present

## 2024-03-22 DIAGNOSIS — R2689 Other abnormalities of gait and mobility: Secondary | ICD-10-CM | POA: Diagnosis not present

## 2024-03-22 DIAGNOSIS — G8929 Other chronic pain: Secondary | ICD-10-CM

## 2024-03-22 DIAGNOSIS — I890 Lymphedema, not elsewhere classified: Secondary | ICD-10-CM | POA: Diagnosis not present

## 2024-03-22 DIAGNOSIS — M6281 Muscle weakness (generalized): Secondary | ICD-10-CM | POA: Diagnosis not present

## 2024-03-22 DIAGNOSIS — R260 Ataxic gait: Secondary | ICD-10-CM

## 2024-03-22 DIAGNOSIS — M25562 Pain in left knee: Secondary | ICD-10-CM | POA: Diagnosis not present

## 2024-03-22 DIAGNOSIS — Z7409 Other reduced mobility: Secondary | ICD-10-CM | POA: Diagnosis not present
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
## 2024-03-24 ENCOUNTER — Ambulatory Visit: Admitting: Occupational Therapy

## 2024-03-24 ENCOUNTER — Ambulatory Visit: Payer: Self-pay | Admitting: Nurse Practitioner

## 2024-03-24 ENCOUNTER — Ambulatory Visit

## 2024-03-24 DIAGNOSIS — M25562 Pain in left knee: Secondary | ICD-10-CM | POA: Diagnosis not present

## 2024-03-24 DIAGNOSIS — M6281 Muscle weakness (generalized): Secondary | ICD-10-CM | POA: Diagnosis not present

## 2024-03-24 DIAGNOSIS — Z7409 Other reduced mobility: Secondary | ICD-10-CM | POA: Diagnosis not present

## 2024-03-24 DIAGNOSIS — I890 Lymphedema, not elsewhere classified: Secondary | ICD-10-CM | POA: Diagnosis not present

## 2024-03-24 DIAGNOSIS — R262 Difficulty in walking, not elsewhere classified: Secondary | ICD-10-CM

## 2024-03-24 DIAGNOSIS — G8929 Other chronic pain: Secondary | ICD-10-CM

## 2024-03-24 DIAGNOSIS — R260 Ataxic gait: Secondary | ICD-10-CM

## 2024-03-24 DIAGNOSIS — R2689 Other abnormalities of gait and mobility: Secondary | ICD-10-CM | POA: Diagnosis not present

## 2024-03-24 DIAGNOSIS — M25561 Pain in right knee: Secondary | ICD-10-CM | POA: Diagnosis not present

## 2024-03-24 NOTE — Therapy (Signed)
 OUTPATIENT OCCUPATIONAL THERAPY TREATMENT NOTE  BILATERAL LOWER EXTREMITY/ BLQ LYMPHEDEMA  Patient Name: Natasha [name] MRN: [id number] DOB:[birth year], 73 y.o., female Today's Date: 03/24/2024  END OF SESSION:  OT End of Session - 03/24/24 1555     Visit Number 8    Number of Visits 36    Date for OT Re-Evaluation 05/13/24    OT Start Time 0315    OT Stop Time 0400    OT Time Calculation (min) 45 min    Activity Tolerance Patient tolerated treatment well;No increased pain    Behavior During Therapy [facility] for tasks assessed/performed             Past Medical History:  Diagnosis Date   Abdominal spasms 12/14/2020   Acute pain of right shoulder 01/31/2023   Acute pulmonary embolism (HCC) 08/21/2019   Bacterial conjunctivitis of both eyes 12/14/2020   Bilateral leg edema 02/10/2020   Bradycardia    Breast cancer screening by mammogram 11/24/2020   Candida infection 10/11/2022   CARCINOMA, THYROID  GLAND, PAPILLARY 05/04/2008   Stage 2, 8/09: thyroidectomy for 2.7cm papillary adenocarcinoma (t2 n0 mo) 9/09: I-131 rx, 108 mci 05/10: tg is neg (ab neg) , total body scan is neg   Chronic right-sided low back pain with right-sided sciatica 11/24/2020   Colon cancer screening 11/24/2020   Colon polyps    Complication of anesthesia    trouble with airway   Cough 12/25/2007   Qualifier: Diagnosis of  By: Germaine LPN, Megan     RNCPI-80 08/19/2019   Decreased ROM of neck 01/31/2023   Diverticulosis    DVT (deep venous thrombosis) (HCC)    Edema 12/22/2008   Encounter to establish care 11/24/2020   Groin pain, chronic, right 11/24/2020   Health care maintenance 11/26/2022   Hip pain 12/22/2020   History of 2019 novel coronavirus disease (COVID-19) 11/09/2019   HYPERTENSION 12/25/2007   HYPOTHYROIDISM, POSTSURGICAL 05/04/2008   LEG PAIN, LEFT 12/09/2008   Low TSH level 12/10/2022   Lumbago with sciatica, right side 01/08/2021   Memory loss due to medical condition 11/09/2019    Muscle weakness    Nausea and vomiting 05/23/2008   Qualifier: Diagnosis of  By: Kassie MD, Alyce LABOR   Formatting of this note might be different from the original. Qualifier: Diagnosis of  By: Kassie MD, Sean A   Neck pain 01/31/2023   Non-recurrent acute suppurative otitis media of left ear without spontaneous rupture of tympanic membrane 02/17/2023   Numbness and tingling    Obesity, morbid, BMI 50 or higher (HCC) 08/24/2019   OSA (obstructive sleep apnea) 06/15/2013   CPAP   Paresthesia 01/04/2020   Peripheral edema 12/22/2008   Qualifier: Diagnosis of  By: Delford, MD, CODY Maude Dunnings    Pneumonia due to COVID-19 virus    Pulmonary embolism (HCC)    Pulmonary embolism (HCC) 08/23/2019   Right lower quadrant pain 11/24/2020   Sciatica of left side 12/04/2011   Skin sensation disturbance 12/05/2008   Qualifier: Diagnosis of  By: Inocencio MD, Berwyn LABOR Deal of this note might be different from the original. Qualifier: Diagnosis of  By: Inocencio MD, Valerie A   Snoring 12/10/2022   Spasm of muscle of lower back 12/14/2020   Upper back pain 01/31/2023   Vaginal candidiasis 03/16/2021   Weakness 01/04/2020   Past Surgical History:  Procedure Laterality Date   ABDOMINAL HYSTERECTOMY     due to bleeding, ovaries remain   ANKLE SURGERY Right  CERVICAL FUSION     x 2   COLONOSCOPY WITH PROPOFOL  N/A 08/08/2015   Procedure: COLONOSCOPY WITH PROPOFOL ;  Surgeon: Renaye Sous, MD;  Location: [facility];  Service: Endoscopy;  Laterality: N/A;   KNEE SURGERY Left    TOTAL THYROIDECTOMY     Patient Active Problem List   Diagnosis Date Noted   Anemia 03/04/2024   Chronic venous insufficiency 09/10/2023   Weakness of both lower extremities 09/10/2023   Atrophic vaginitis 05/14/2023   Pelvic floor dysfunction 05/14/2023   Pancreatic insufficiency 05/14/2023   Arthritis of knee 03/24/2023   At risk for fall due to comorbid condition 03/10/2023   Mood changes 02/17/2023    Recurrent urinary tract infection 01/31/2023   Walker as ambulation aid 01/31/2023   B12 deficiency 11/26/2022   Other fatigue 11/26/2022   Prediabetes 11/26/2022   Obesity, Class III, BMI 40-49.9 (morbid obesity) 11/26/2022   Dermatofibroma 10/11/2022   Fatty liver 04/20/2021   Incontinence of urine in female 04/20/2021   Vitamin D  deficiency 03/16/2021   Constipation 11/24/2020   Gastroesophageal reflux disease 11/24/2020   OAB (overactive bladder) 11/24/2020   Aortic atherosclerosis (HCC) 11/24/2020   Right-sided heart failure (HCC) 11/24/2020   History of pulmonary embolism 02/10/2020   Lymphedema 02/10/2020   Mobility impaired 01/04/2020   Muscle weakness (generalized) 11/09/2019   Pulmonary nodule 11/09/2019   OSA (obstructive sleep apnea) 06/15/2013   Allergic rhinitis 01/21/2009   History of papillary adenocarcinoma of thyroid  05/04/2008   Hypothyroidism, postsurgical 05/04/2008   Essential hypertension 12/25/2007   SOB (shortness of breath) on exertion 12/25/2007    PCP: Camie Doing, NP  REFERRING PROVIDER: Camie Doing, NP  REFERRING DIAG: I89.0  THERAPY DIAG:  Lymphedema, not elsewhere classified  Rationale for Evaluation and Treatment: Rehabilitation  ONSET DATE: >15 years  SUBJECTIVE:                                                                                                                                                                                          SUBJECTIVE STATEMENT: Natasha [name] presents to OT in manual transport wc for LE care.Pt is accompanied by a family friend. She had a PT visit before this OT session.  Pt tells me she has been able wrap daily with assistance from a family friend. Session interrupted by 15 minute Pt bathroom break. Only had time for compression wrapping and some Pt edu today .  PERTINENT HISTORY:  Hypothyroidism, postsurgical Essential hypertension Dyspnea OSA (obstructive sleep apnea) Obesity, morbid, BMI 50 or  higher (HCC) Muscle weakness (generalized) Gait abnormality History of pulmonary embolism Bilateral lower extremity edema Chronic right-sided low back  pain with right-sided sciatica Right-sided heart failure (HCC) Hip pain Incontinence of urine in female Dermatofibroma Morbid obesity (HCC) BMI 50.0-59.9, adult (HCC) SOB (shortness of breath) on exertion Other fatigue Prediabetes Decreased ROM of neck Upper back pain Acute pain of right shoulder Recurrent urinary tract infection Urine frequency Walker as ambulation aid Ambulatory dysfunction Mood changes At risk for fall due to comorbid condition Arthritis of knee Hypothyroidism Chronic venous insufficiency Mobility impaired Weakness of both lower extremities Gait instability Lymphedema BMI 45.0-49.9, adult (HCC)  PAIN:  Are you having pain? Yes, B knees: NPRS scale: 6/10 Pain location:  Pain description: creaky crackly, tight, heavy, sore, tender. With and without weight bearing Aggravating factors: standing, walking, weight bearing Relieving factors: walking  PRECAUTIONS: Other: LYMPHEDEMA PRECAUTIONS: prediabetic, hypothyroid,  RED FLAGS: Urinary incontinence; numbness and tingling in fingers, hands and toes   WEIGHT BEARING RESTRICTIONS: No  FALLS:  Has patient fallen in last 6 months? No  LIVING ENVIRONMENT: Lives with: lives with their daughter and  granddaughter Lives in: House/apartment Stairs: Yes; Internal: 14 steps; on right going up and External: garage 4 steps steps; can reach both Has following equipment at home: Walker - 4 wheeled, shower chair, and elevated toilet seat  OCCUPATION: retired Careers information officer professor  LEISURE: concerts, writing and producing plays  HAND DOMINANCE: right   PRIOR LEVEL OF FUNCTION: Independent  PATIENT GOALS: walk unassisted; lose weight   OBJECTIVE: Note: Objective measures were completed at Evaluation unless otherwise noted.  COGNITION:  Overall cognitive  status: Within functional limits for tasks assessed   OBSERVATIONS / OTHER ASSESSMENTS:   POSTURE: head forward, slight shoulder protraction  LE ROM: Limited at hips knees and ankles 2/2 body habitus and skin approximation  LE MMT: generalized weakness  LYMPHEDEMA ASSESSMENTS:   BLE COMPARATIVE LIMB VOLUMETRICS: Initial 02/17/24  LANDMARK RIGHT   R LEG (A-D) 6530.2 ml  R THIGH (E-G) ml  R FULL LIMB (A-G) ml  Limb Volume differential (LVD)  %  Volume change since initial %  Volume change overall V  (Blank rows = not tested)  LANDMARK LEFT  L LEG (A-D) 6601.0  L  THIGH (E-G) ml  L  FULL LIMB (A-G) ml  Limb Volume differential (LVD)  1.08% L>R  Volume change since initial %  Volume change overall %  (Blank rows = not tested)      SKIN/TISSUE INTEGRITY: : Moderate, Stage  II, Bilateral Lower Extremity Lymphedema 2/2 CVI,  Obesity, and Mm weakness  Skin  Description Hyper-Keratosis Peau d' Orange Shiny Tight Fibrotic/ Indurated Fatty Hard Spongy/ boggy   x   x R>L x x x   Skin dry Flaky WNL Macerated   mildly      Color Redness Varicosities Blanching Hemosiderin Stain Mottled        x   Odor Malodorous Yeast Fungal infection  WNL      x   Temperature Warm Cool wnl    x     Pitting Edema   1+ 2+ 3+ 4+ Non-pitting   x         Girth Symmetrical Asymmetrical                   Distribution    R>L toes to groin, bilaterally    Stemmer Sign Positive Negative   +    Lymphorrhea History Of:  Present Absent     x    Wounds History Of Present Absent Venous Arterial Pressure Sheer   denies  x        Signs of Infection Redness Warmth Erythema Acute Swelling Drainage Borders                    Sensation Light Touch Deep pressure Hypersensitivity   In tact Impaired In tact Impaired Absent Impaired   x  x  x     Nails WNL   Fungus nail dystrophy   TBA     Hair Growth Symmetrical Asymmetrical   TB Ax    Skin Creases Base of toes  Ankles   Base  of Fingers knees       Abdominal pannus Thigh Lobules  Face/neck   x x  x       GAIT: Pt transported to clinic in transport wheelchair Distance walked: Pt able to transfer out of wc and walk 4-5 steps using 2 wheeled walker to Rx bed with extra time (modified independent).  Assistive device utilized: Pt able to lift legs onto Rx bed, one at a time, using hands to actively assist Level of assistance: Modified independence Comments: Transfers  and bed mobility take an inordinate amount of time  LYMPHEDEMA LIFE IMPACT SCALE (LLIS): Initial: 86.76% (The extent to which LE-related problems impacted your life over the past week.)                                                                                                                           TREATMENT DATE: 02/12/24 RLE/RLQ MLD Compression wrapping Pt/ CG edu   PATIENT EDUCATION:  Continued Pt/ CG edu for lymphedema self care home program throughout session. Topics include outcome of comparative limb volumetrics- starting limb volume differentials (LVDs), technology and gradient techniques used for short stretch, multilayer compression wrapping, simple self-MLD, therapeutic lymphatic pumping exercises, skin/nail care, LE precautions, compression garment recommendations and specifications, wear and care schedule and compression garment donning / doffing w assistive devices. Discussed progress towards all OT goals since commencing CDT. Discussed detrimental impact of obesity on lower and upper extremity lymphedema over time. Reviewed OT goals for lymphedema care with Pt and discussed progress to date.  All questions answered to the Pt's satisfaction. Good return. Person educated: Patient and family Education method: Explanation, Demonstration, and Handouts Education comprehension: verbalized understanding, returned demonstration, verbal cues required, and needs further education  LYMPHEDEMA SELF-CARE HOME PROGRAM: BLE lymphatic pumping  there ex- 1 set of 10 each element, in order. Hold 5. 2 x daily 2. Daily, short stretch, thigh length, multilayer compression bandages during Intensive Phase CDT 3. During self-Management Phase fit with appropriate compression garments and/ or devices 3. Daily skin care with low ph lotion matching skin ph 4. Daily simple self MLD   ASSESSMENT:  CLINICAL IMPRESSION: Time constraints today limited OT session duration. No time for MLD, so we applied compression wraps to RLE today. Pt brings clean wraps, but is not wrapped at beginning of session. Next visit check measurements for CircAids   for RLE.  Cont as per POC.  (02/12/24 Initial OT Eval : Natasha [name] is a 73 yo female presenting with moderate, stage  II, bilateral lower extremity lymphedema 2/2 CVI,  obesity, and mm weakness. She will benefit from skilled Occupational Therapy to reduce limb swelling and associated pain, to limit progression and infection risk. BLE lymphedema limits functional performance in all occupational domains, including functional mobility and ambulation,  basic and instrumental ADLs, productive activities, leisure pursuits, social participation and quality of life. This patient will benefit from modified Intensive and Self Management Phase CDT . In addition to MLD, ther ex, skin care and compression bandaging below the knees, Pt will be fitted with custom knee length compression stockings paired with off the shelf , Capri length compression leggings. If insurance benefits allow, she may undergo a trial in the clinic with the advanced Flexitouch device in an effort to reduce discomfort and reduce infection risk. Without skilled OT Li-lymphedema will worsen over time and further functional decline is expected.    Ms Flynt is unable to reach feet and distal legs to apply multilayer , gradient compression wraps during the Intensive Phase of CDT.  She understands this limitation and agrees to  arrange for a caregiver to assist  her daily with compression wrapping between OT visits. With a caregiver to assist her with Intensive Phase compression her prognosis is fair. Without CG assistance her prognosis is poor.)  OBJECTIVE IMPAIRMENTS: decreased activity tolerance, decreased balance, decreased endurance, decreased knowledge of condition, decreased knowledge of use of DME, decreased mobility, difficulty walking, decreased ROM, decreased strength, increased edema, impaired UE functional use, postural dysfunction, obesity, pain, and chronic leg swelling.   ACTIVITY LIMITATIONS: ACTIVITY LIMITATIONS: Mobility and functional ambulation limitations:  abnormal gait pattern, difficulty walking, carrying, lifting, bending, sitting, standing, squatting, stairs, transfers, and bed mobility Basic and instrumental ADLs (reaching feet and distal legs to groom nails, inspect skin, apply lotion, bathe lower body, difficulty with LB dressing, including fitting LB clothing, shoes and socks, impaired sleeping, meal prep, standing to cook, driving, shopping, yard work, house work Paediatric nurse activities: work related activities requiring extended standing, walking, sitting; caring for others Social participation in the community, socializing with others, LE affects body image  Leisure pursuits requiring extended standing, walking, sitting  PERSONAL FACTORS: Fitness, Time since onset of injury/illness/exacerbation, and 3+ comorbidities: Obesity, arthritis, OSA are also affecting patient's functional outcome.   REHAB POTENTIAL: Fair with daily caregiver assistance with gradient compression wrapping. Without assistance prognosis is poor as Pt is unable to apply wraps independently  EVALUATION COMPLEXITY: Moderate   GOALS: Goals reviewed with patient? Yes   SHORT TERM GOALS: Target date: 4th OT Rx visit  Pt will demonstrate understanding of lymphedema precautions and prevention strategies with modified independence using a printed reference to  identify at least 5 precautions and discussing how s/he may implement them into daily life to reduce risk of progression with modified assistance Baseline: max a Goal status: INITIAL   2.  With Max caregiver assistance Pt will be able to apply multilayer, knee length, compression wraps using gradient techniques to decrease limb volume, to limit infection risk, and to limit lymphedema progression.  Given this patient's Intake score of tbd % on the Lymphedema Life Impact Scale (LLIS), patient will experience a reduction of at least 5% in her perceived level of functional impairment resulting from lymphedema to improve functional performance and quality of life (QOL). Baseline: Dependent Goal status: INITIAL     LONG TERM GOALS: Target  date: 05/13/24 Given this patient's Intake score of 86.76 % on the Lymphedema Life Impact Scale (LLIS), patient will experience a reduction of at least 10% in her perceived level of functional impairment resulting from lymphedema to improve functional performance and quality of life (QOL).Baseline: tbd Baseline: max a Goal status: INITIAL     2.  With modified independence (extra time and assistive devices) Pt will be able to don and doff appropriate compression garments and/or devices to control BLE lymphedema and to limit progression.  Baseline: Dependent Goal status: INITIAL   3. Pt will achieve at least a 10% volume reductions bilaterally below the knees to return limb to more typical size and shape, to limit infection risk and LE progression, to decrease pain, to improve function, and to improve body image and QOL. Baseline: Dependent Goal status: INITIAL   4. Pt will achieve and sustain at least 85% compliance with all LE self-care home program components throughout CDT, including modified simple self-MLD, daily skin care and inspection, lymphatic pumping the ex and appropriate compression to limit lymphedema progression and to limit further functional  decline. Baseline: Dependent. (Note: W/out daily assistance with donning/ doffing compression  , prognosis is poor.) Goal status: INITIAL   PLAN:  OT FREQUENCY: 2x/week and PRN  OT DURATION: other: 18 weeks and PRN  PLANNED INTERVENTIONS:  Complete Decongestive Therapy (CDT): Manual Lymphatic Drainage (MLD) , Skin care, ther ex, gradient compression 97110-Therapeutic exercises, 97530- Therapeutic activity, 97535- Self Care, [id number]- Manual therapy, Patient/Family education, Manual lymph drainage, Compression bandaging, DME instructions, and trial with advanced, sequential, pneumatic, compression device (Tactile Medical Flexitouch) Pt will arrange for a caregiver to assist her daily with compression wrapping between OT visits.  Custom-made gradient compression garments and HOS devices are medically necessary because they are uniquely sized and shaped to fit the exact dimensions of the affected extremities, and to provide appropriate medical grade, graduated compression essential for optimally managing chronic, progressive lymphedema. Multiple custom compression garments are needed to ensure proper hygiene to limit infection risk. Custom compression garments should be replaced q 3-6 months When worn consistently for optimal lipo-lymphedema self-management over time. HOS devices, medically necessary to limit fibrosis buildup in tissue, should be replaced q 2 years and PRN when worn out.   PLAN FOR NEXT SESSION:  Pt/ caregiver edu Knee length, Multilayer compression wraps to one leg only-Pt's choice.  Zebedee Dec, MS, OTR/L, CLT-LANA 03/24/24 3:57 PM'
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
## 2024-03-25 ENCOUNTER — Other Ambulatory Visit: Payer: Self-pay

## 2024-03-25 DIAGNOSIS — E559 Vitamin D deficiency, unspecified: Secondary | ICD-10-CM

## 2024-03-25 MED ORDER — VITAMIN D (ERGOCALCIFEROL) 1.25 MG (50000 UNIT) PO CAPS: 50000.0000 [IU] | ORAL_CAPSULE | ORAL | 3 refills | Status: AC

## 2024-03-30 ENCOUNTER — Other Ambulatory Visit: Payer: Self-pay

## 2024-03-30 DIAGNOSIS — R0602 Shortness of breath: Secondary | ICD-10-CM

## 2024-03-30 DIAGNOSIS — I890 Lymphedema, not elsewhere classified: Secondary | ICD-10-CM

## 2024-03-30 DIAGNOSIS — R7303 Prediabetes: Secondary | ICD-10-CM

## 2024-03-30 DIAGNOSIS — Z9181 History of falling: Secondary | ICD-10-CM

## 2024-03-30 DIAGNOSIS — R32 Unspecified urinary incontinence: Secondary | ICD-10-CM

## 2024-03-30 DIAGNOSIS — I10 Essential (primary) hypertension: Secondary | ICD-10-CM

## 2024-03-30 DIAGNOSIS — G4733 Obstructive sleep apnea (adult) (pediatric): Secondary | ICD-10-CM

## 2024-03-30 DIAGNOSIS — M6281 Muscle weakness (generalized): Secondary | ICD-10-CM

## 2024-03-30 DIAGNOSIS — Z7409 Other reduced mobility: Secondary | ICD-10-CM

## 2024-03-30 DIAGNOSIS — N3281 Overactive bladder: Secondary | ICD-10-CM

## 2024-03-31 ENCOUNTER — Ambulatory Visit

## 2024-03-31 ENCOUNTER — Ambulatory Visit: Admitting: Occupational Therapy

## 2024-03-31 DIAGNOSIS — Z7409 Other reduced mobility: Secondary | ICD-10-CM | POA: Diagnosis not present

## 2024-03-31 DIAGNOSIS — M6281 Muscle weakness (generalized): Secondary | ICD-10-CM

## 2024-03-31 DIAGNOSIS — G8929 Other chronic pain: Secondary | ICD-10-CM | POA: Diagnosis not present

## 2024-03-31 DIAGNOSIS — R262 Difficulty in walking, not elsewhere classified: Secondary | ICD-10-CM

## 2024-03-31 DIAGNOSIS — I890 Lymphedema, not elsewhere classified: Secondary | ICD-10-CM | POA: Diagnosis not present

## 2024-03-31 DIAGNOSIS — M25561 Pain in right knee: Secondary | ICD-10-CM | POA: Diagnosis not present

## 2024-03-31 DIAGNOSIS — R2689 Other abnormalities of gait and mobility: Secondary | ICD-10-CM | POA: Diagnosis not present

## 2024-03-31 DIAGNOSIS — R260 Ataxic gait: Secondary | ICD-10-CM | POA: Diagnosis not present

## 2024-03-31 DIAGNOSIS — M25562 Pain in left knee: Secondary | ICD-10-CM | POA: Diagnosis not present

## 2024-03-31 NOTE — Telephone Encounter (Signed)
 Note in referral from Caryn: 03/30/24  Pt has been notified that they need to return missed calls/voicemails. She let us  know she has a robo call block on her phone which may have impacted her not receiving calls. She was given the phone number Direct Dial: [phone] from the previous docu
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
## 2024-03-31 NOTE — Therapy (Signed)
 OUTPATIENT OCCUPATIONAL THERAPY TREATMENT NOTE  BILATERAL LOWER EXTREMITY/ BLQ LYMPHEDEMA  Patient Name: Natasha [name] MRN: [id number] DOB:06-17-1951, 73 y.o., female Today's Date: 03/31/2024  END OF SESSION:  OT End of Session - 03/31/24 0921     Visit Number 9    Number of Visits 36    Date for OT Re-Evaluation 05/13/24    OT Start Time 0910    OT Stop Time 1010    OT Time Calculation (min) 60 min    Activity Tolerance Patient tolerated treatment well;No increased pain    Behavior During Therapy [facility] for tasks assessed/performed             Past Medical History:  Diagnosis Date   Abdominal spasms 12/14/2020   Acute pain of right shoulder 01/31/2023   Acute pulmonary embolism (HCC) 08/21/2019   Bacterial conjunctivitis of both eyes 12/14/2020   Bilateral leg edema 02/10/2020   Bradycardia    Breast cancer screening by mammogram 11/24/2020   Candida infection 10/11/2022   CARCINOMA, THYROID  GLAND, PAPILLARY 05/04/2008   Stage 2, 8/09: thyroidectomy for 2.7cm papillary adenocarcinoma (t2 n0 mo) 9/09: I-131 rx, 108 mci 05/10: tg is neg (ab neg) , total body scan is neg   Chronic right-sided low back pain with right-sided sciatica 11/24/2020   Colon cancer screening 11/24/2020   Colon polyps    Complication of anesthesia    trouble with airway   Cough 12/25/2007   Qualifier: Diagnosis of  By: Germaine LPN, Megan     RNCPI-80 08/19/2019   Decreased ROM of neck 01/31/2023   Diverticulosis    DVT (deep venous thrombosis) (HCC)    Edema 12/22/2008   Encounter to establish care 11/24/2020   Groin pain, chronic, right 11/24/2020   Health care maintenance 11/26/2022   Hip pain 12/22/2020   History of 2019 novel coronavirus disease (COVID-19) 11/09/2019   HYPERTENSION 12/25/2007   HYPOTHYROIDISM, POSTSURGICAL 05/04/2008   LEG PAIN, LEFT 12/09/2008   Low TSH level 12/10/2022   Lumbago with sciatica, right side 01/08/2021   Memory loss due to medical condition 11/09/2019    Muscle weakness    Nausea and vomiting 05/23/2008   Qualifier: Diagnosis of  By: Kassie MD, Alyce LABOR   Formatting of this note might be different from the original. Qualifier: Diagnosis of  By: Kassie MD, Sean A   Neck pain 01/31/2023   Non-recurrent acute suppurative otitis media of left ear without spontaneous rupture of tympanic membrane 02/17/2023   Numbness and tingling    Obesity, morbid, BMI 50 or higher (HCC) 08/24/2019   OSA (obstructive sleep apnea) 06/15/2013   CPAP   Paresthesia 01/04/2020   Peripheral edema 12/22/2008   Qualifier: Diagnosis of  By: Delford, MD, CODY Maude Dunnings    Pneumonia due to COVID-19 virus    Pulmonary embolism (HCC)    Pulmonary embolism (HCC) 08/23/2019   Right lower quadrant pain 11/24/2020   Sciatica of left side 12/04/2011   Skin sensation disturbance 12/05/2008   Qualifier: Diagnosis of  By: Inocencio MD, Berwyn LABOR Deal of this note might be different from the original. Qualifier: Diagnosis of  By: Inocencio MD, Valerie A   Snoring 12/10/2022   Spasm of muscle of lower back 12/14/2020   Upper back pain 01/31/2023   Vaginal candidiasis 03/16/2021   Weakness 01/04/2020   Past Surgical History:  Procedure Laterality Date   ABDOMINAL HYSTERECTOMY     due to bleeding, ovaries remain   ANKLE SURGERY Right  CERVICAL FUSION     x 2   COLONOSCOPY WITH PROPOFOL  N/A 08/08/2015   Procedure: COLONOSCOPY WITH PROPOFOL ;  Surgeon: Renaye Sous, MD;  Location: [facility];  Service: Endoscopy;  Laterality: N/A;   KNEE SURGERY Left    TOTAL THYROIDECTOMY     Patient Active Problem List   Diagnosis Date Noted   Anemia 03/04/2024   Chronic venous insufficiency 09/10/2023   Weakness of both lower extremities 09/10/2023   Atrophic vaginitis 05/14/2023   Pelvic floor dysfunction 05/14/2023   Pancreatic insufficiency 05/14/2023   Arthritis of knee 03/24/2023   At risk for fall due to comorbid condition 03/10/2023   Mood changes 02/17/2023    Recurrent urinary tract infection 01/31/2023   Walker as ambulation aid 01/31/2023   B12 deficiency 11/26/2022   Other fatigue 11/26/2022   Prediabetes 11/26/2022   Obesity, Class III, BMI 40-49.9 (morbid obesity) 11/26/2022   Dermatofibroma 10/11/2022   Fatty liver 04/20/2021   Incontinence of urine in female 04/20/2021   Vitamin D  deficiency 03/16/2021   Constipation 11/24/2020   Gastroesophageal reflux disease 11/24/2020   OAB (overactive bladder) 11/24/2020   Aortic atherosclerosis (HCC) 11/24/2020   Right-sided heart failure (HCC) 11/24/2020   History of pulmonary embolism 02/10/2020   Lymphedema 02/10/2020   Mobility impaired 01/04/2020   Muscle weakness (generalized) 11/09/2019   Pulmonary nodule 11/09/2019   OSA (obstructive sleep apnea) 06/15/2013   Allergic rhinitis 01/21/2009   History of papillary adenocarcinoma of thyroid  05/04/2008   Hypothyroidism, postsurgical 05/04/2008   Essential hypertension 12/25/2007   SOB (shortness of breath) on exertion 12/25/2007    PCP: Camie Doing, NP  REFERRING PROVIDER: Camie Doing, NP  REFERRING DIAG: I89.0  THERAPY DIAG:  Lymphedema, not elsewhere classified  Rationale for Evaluation and Treatment: Rehabilitation  ONSET DATE: >15 years  SUBJECTIVE:                                                                                                                                                                                          SUBJECTIVE STATEMENT: Natasha [name] presents to OT in manual transport wc for LE care.Pt is accompanied by her granddaughter. She has a PT session after this OT session.  Pt tells me she has been able wrap daily with assistance from a family friend. Pt states she has been using the compression pump on the LLE, the yet to be treated leg. Pt reporting 6/10 knee pain this morning, and increased swelling in the L Leg.  PERTINENT HISTORY:  Hypothyroidism, postsurgical Essential  hypertension Dyspnea OSA (obstructive sleep apnea) Obesity, morbid, BMI 50 or higher (HCC) Muscle weakness (generalized) Gait abnormality History  of pulmonary embolism Bilateral lower extremity edema Chronic right-sided low back pain with right-sided sciatica Right-sided heart failure (HCC) Hip pain Incontinence of urine in female Dermatofibroma Morbid obesity (HCC) BMI 50.0-59.9, adult (HCC) SOB (shortness of breath) on exertion Other fatigue Prediabetes Decreased ROM of neck Upper back pain Acute pain of right shoulder Recurrent urinary tract infection Urine frequency Walker as ambulation aid Ambulatory dysfunction Mood changes At risk for fall due to comorbid condition Arthritis of knee Hypothyroidism Chronic venous insufficiency Mobility impaired Weakness of both lower extremities Gait instability Lymphedema BMI 45.0-49.9, adult (HCC)  PAIN:  Are you having pain? Yes, B knees: NPRS scale: 6/10 Pain location:  Pain description: creaky crackly, tight, heavy, sore, tender. With and without weight bearing Aggravating factors: standing, walking, weight bearing Relieving factors: walking  PRECAUTIONS: Other: LYMPHEDEMA PRECAUTIONS: prediabetic, hypothyroid,  RED FLAGS: Urinary incontinence; numbness and tingling in fingers, hands and toes   WEIGHT BEARING RESTRICTIONS: No  FALLS:  Has patient fallen in last 6 months? No  LIVING ENVIRONMENT: Lives with: lives with their daughter and  granddaughter Lives in: House/apartment Stairs: Yes; Internal: 14 steps; on right going up and External: garage 4 steps steps; can reach both Has following equipment at home: Walker - 4 wheeled, shower chair, and elevated toilet seat  OCCUPATION: retired Careers information officer professor  LEISURE: concerts, writing and producing plays  HAND DOMINANCE: right   PRIOR LEVEL OF FUNCTION: Independent  PATIENT GOALS: walk unassisted; lose weight   OBJECTIVE: Note: Objective measures were  completed at Evaluation unless otherwise noted.  COGNITION:  Overall cognitive status: Within functional limits for tasks assessed   OBSERVATIONS / OTHER ASSESSMENTS:   POSTURE: head forward, slight shoulder protraction  LE ROM: Limited at hips knees and ankles 2/2 body habitus and skin approximation  LE MMT: generalized weakness  LYMPHEDEMA ASSESSMENTS:   BLE COMPARATIVE LIMB VOLUMETRICS: Initial 02/17/24  LANDMARK RIGHT   R LEG (A-D) 6530.2 ml  R THIGH (E-G) ml  R FULL LIMB (A-G) ml  Limb Volume differential (LVD)  %  Volume change since initial %  Volume change overall V  (Blank rows = not tested)  LANDMARK LEFT  L LEG (A-D) 6601.0  L  THIGH (E-G) ml  L  FULL LIMB (A-G) ml  Limb Volume differential (LVD)  1.08% L>R  Volume change since initial %  Volume change overall %  (Blank rows = not tested)    RLE COMPARATIVE LIMB VOLUMETRICS: 9th visit 03/31/24   LANDMARK RIGHT   R LEG (A-D) 4941.4 ml  R THIGH (E-G) ml  R FULL LIMB (A-G) ml  Limb Volume differential (LVD)  %  Volume change since initial R LEG volume DECREASED by 24.3% since commencing OT for CDT on 02/17/24   Volume change overall V  (Blank rows = not tested)    SKIN/TISSUE INTEGRITY: : Moderate, Stage  II, Bilateral Lower Extremity Lymphedema 2/2 CVI,  Obesity, and Mm weakness  Skin  Description Hyper-Keratosis Peau d' Orange Shiny Tight Fibrotic/ Indurated Fatty Hard Spongy/ boggy   x   x R>L x x x   Skin dry Flaky WNL Macerated   mildly      Color Redness Varicosities Blanching Hemosiderin Stain Mottled        x   Odor Malodorous Yeast Fungal infection  WNL      x   Temperature Warm Cool wnl    x     Pitting Edema   1+ 2+ 3+ 4+ Non-pitting   x  Girth Symmetrical Asymmetrical                   Distribution    R>L toes to groin, bilaterally    Stemmer Sign Positive Negative   +    Lymphorrhea History Of:  Present Absent     x    Wounds History Of Present Absent  Venous Arterial Pressure Sheer   denies  x        Signs of Infection Redness Warmth Erythema Acute Swelling Drainage Borders                    Sensation Light Touch Deep pressure Hypersensitivity   In tact Impaired In tact Impaired Absent Impaired   x  x  x     Nails WNL   Fungus nail dystrophy   TBA     Hair Growth Symmetrical Asymmetrical   TB Ax    Skin Creases Base of toes  Ankles   Base of Fingers knees       Abdominal pannus Thigh Lobules  Face/neck   x x  x       GAIT: Pt transported to clinic in transport wheelchair Distance walked: Pt able to transfer out of wc and walk 4-5 steps using 2 wheeled walker to Rx bed with extra time (modified independent).  Assistive device utilized: Pt able to lift legs onto Rx bed, one at a time, using hands to actively assist Level of assistance: Modified independence Comments: Transfers  and bed mobility take an inordinate amount of time  LYMPHEDEMA LIFE IMPACT SCALE (LLIS): Initial: 86.76% (The extent to which LE-related problems impacted your life over the past week.)                                                                                                                           TREATMENT DATE: 02/12/24 RLE comparative limb volumetrics Compression wrapping Pt/ CG edu   PATIENT EDUCATION:  Continued Pt/ CG edu for lymphedema self care home program throughout session. Topics include outcome of comparative limb volumetrics- starting limb volume differentials (LVDs), technology and gradient techniques used for short stretch, multilayer compression wrapping, simple self-MLD, therapeutic lymphatic pumping exercises, skin/nail care, LE precautions, compression garment recommendations and specifications, wear and care schedule and compression garment donning / doffing w assistive devices. Discussed progress towards all OT goals since commencing CDT. Discussed detrimental impact of obesity on lower and upper extremity  lymphedema over time. Reviewed OT goals for lymphedema care with Pt and discussed progress to date.  All questions answered to the Pt's satisfaction. Good return. Person educated: Patient and family Education method: Explanation, Demonstration, and Handouts Education comprehension: verbalized understanding, returned demonstration, verbal cues required, and needs further education  LYMPHEDEMA SELF-CARE HOME PROGRAM: BLE lymphatic pumping there ex- 1 set of 10 each element, in order. Hold 5. 2 x daily 2. Daily, short stretch, thigh length, multilayer compression bandages during Intensive Phase CDT 3. During self-Management  Phase fit with appropriate compression garments and/ or devices 3. Daily skin care with low ph lotion matching skin ph 4. Daily simple self MLD   ASSESSMENT:  CLINICAL IMPRESSION: Limb volumes completed today in prep for 10 th visit progress report. Volumetrics reveal R LEG volume DECREASED by 24.3% since commencing OT for CDT on 02/17/24. This value meets and exceeds the initial 10% volume reduction goal for the RLE. Goal elevated to 25% bilaterally. Applied R LE gradient compression wraps, and added doubled, knee length, Tubigrip to L LEG to reduce ankle pain and swelling. A shorter piece of Tubigrip tended to roll down. Next visit check measurements for CircAids   for RLE. Cont as per POC.  (02/12/24 Initial OT Eval : ELPIDIA KARN is a 73 yo female presenting with moderate, stage  II, bilateral lower extremity lymphedema 2/2 CVI,  obesity, and mm weakness. She will benefit from skilled Occupational Therapy to reduce limb swelling and associated pain, to limit progression and infection risk. BLE lymphedema limits functional performance in all occupational domains, including functional mobility and ambulation,  basic and instrumental ADLs, productive activities, leisure pursuits, social participation and quality of life. This patient will benefit from modified Intensive and Self  Management Phase CDT . In addition to MLD, ther ex, skin care and compression bandaging below the knees, Pt will be fitted with custom knee length compression stockings paired with off the shelf , Capri length compression leggings. If insurance benefits allow, she may undergo a trial in the clinic with the advanced Flexitouch device in an effort to reduce discomfort and reduce infection risk. Without skilled OT Li-lymphedema will worsen over time and further functional decline is expected.    Ms Moritz is unable to reach feet and distal legs to apply multilayer , gradient compression wraps during the Intensive Phase of CDT.  She understands this limitation and agrees to  arrange for a caregiver to assist her daily with compression wrapping between OT visits. With a caregiver to assist her with Intensive Phase compression her prognosis is fair. Without CG assistance her prognosis is poor.)  OBJECTIVE IMPAIRMENTS: decreased activity tolerance, decreased balance, decreased endurance, decreased knowledge of condition, decreased knowledge of use of DME, decreased mobility, difficulty walking, decreased ROM, decreased strength, increased edema, impaired UE functional use, postural dysfunction, obesity, pain, and chronic leg swelling.   ACTIVITY LIMITATIONS: ACTIVITY LIMITATIONS: Mobility and functional ambulation limitations:  abnormal gait pattern, difficulty walking, carrying, lifting, bending, sitting, standing, squatting, stairs, transfers, and bed mobility Basic and instrumental ADLs (reaching feet and distal legs to groom nails, inspect skin, apply lotion, bathe lower body, difficulty with LB dressing, including fitting LB clothing, shoes and socks, impaired sleeping, meal prep, standing to cook, driving, shopping, yard work, house work Paediatric nurse activities: work related activities requiring extended standing, walking, sitting; caring for others Social participation in the community, socializing with  others, LE affects body image  Leisure pursuits requiring extended standing, walking, sitting  PERSONAL FACTORS: Fitness, Time since onset of injury/illness/exacerbation, and 3+ comorbidities: Obesity, arthritis, OSA are also affecting patient's functional outcome.   REHAB POTENTIAL: Fair with daily caregiver assistance with gradient compression wrapping. Without assistance prognosis is poor as Pt is unable to apply wraps independently  EVALUATION COMPLEXITY: Moderate   GOALS: Goals reviewed with patient? Yes   SHORT TERM GOALS: Target date: 4th OT Rx visit  Pt will demonstrate understanding of lymphedema precautions and prevention strategies with modified independence using a printed reference to identify at least 5  precautions and discussing how s/he may implement them into daily life to reduce risk of progression with modified assistance Baseline: max a Goal status:PROGRESSING   2.  With Max caregiver assistance Pt will be able to apply multilayer, knee length, compression wraps using gradient techniques to decrease limb volume, to limit infection risk, and to limit lymphedema progression.  Given this patient's Intake score of tbd % on the Lymphedema Life Impact Scale (LLIS), patient will experience a reduction of at least 5% in her perceived level of functional impairment resulting from lymphedema to improve functional performance and quality of life (QOL). Baseline: Dependent Goal status: 03/31/24 GOAL MET     LONG TERM GOALS: Target date: 05/13/24 Given this patient's Intake score of 86.76 % on the Lymphedema Life Impact Scale (LLIS), patient will experience a reduction of at least 10% in her perceived level of functional impairment resulting from lymphedema to improve functional performance and quality of life (QOL).Baseline: tbd Baseline: max a Goal status: PROGRESSING     2.  With modified independence (extra time and assistive devices) Pt will be able to don and doff appropriate  compression garments and/or devices to control BLE lymphedema and to limit progression.  Baseline: Dependent Goal status: PROGRESSING   3. Pt will achieve at least a 10% volume reductions bilaterally below the knees to return limb to more typical size and shape, to limit infection risk and LE progression, to decrease pain, to improve function, and to improve body image and QOL. Baseline: Dependent Goal status:PROGRESSING. Volumetrics reveal R LEG volume DECREASED by 24.3% since commencing OT for CDT on 02/17/24. This value meets and exceeds the initial 10% volume reduction goal for the RLE. Goal elevated to 25% bilaterally.    4. Pt will achieve and sustain at least 85% compliance with all LE self-care home program components throughout CDT, including modified simple self-MLD, daily skin care and inspection, lymphatic pumping the ex and appropriate compression to limit lymphedema progression and to limit further functional decline. Baseline: Dependent. (Note: W/out daily assistance with donning/ doffing compression  , prognosis is poor.) Goal status:PROGRESSING. Pt hired paid caregiver who assist her with wrapping. Bandaging very well done.   PLAN:  OT FREQUENCY: 2x/week and PRN  OT DURATION: other: 18 weeks and PRN  PLANNED INTERVENTIONS:  Complete Decongestive Therapy (CDT): Manual Lymphatic Drainage (MLD) , Skin care, ther ex, gradient compression 97110-Therapeutic exercises, 97530- Therapeutic activity, 97535- Self Care, [id number]- Manual therapy, Patient/Family education, Manual lymph drainage, Compression bandaging, DME instructions, and trial with advanced, sequential, pneumatic, compression device (Tactile Medical Flexitouch) Pt will arrange for a caregiver to assist her daily with compression wrapping between OT visits.  Custom-made gradient compression garments and HOS devices are medically necessary because they are uniquely sized and shaped to fit the exact dimensions of the affected  extremities, and to provide appropriate medical grade, graduated compression essential for optimally managing chronic, progressive lymphedema. Multiple custom compression garments are needed to ensure proper hygiene to limit infection risk. Custom compression garments should be replaced q 3-6 months When worn consistently for optimal lipo-lymphedema self-management over time. HOS devices, medically necessary to limit fibrosis buildup in tissue, should be replaced q 2 years and PRN when worn out.   PLAN FOR NEXT SESSION:  Pt/ caregiver edu Knee length, Multilayer compression wraps to R leg only Complete anatomical measurements to determine OTS or ready-made CircAids  Zebedee Dec, MS, OTR/L, CLT-LANA 03/31/24 12:56 PM'

## 2024-04-01 ENCOUNTER — Telehealth: Payer: Self-pay

## 2024-04-01 DIAGNOSIS — F4323 Adjustment disorder with mixed anxiety and depressed mood: Secondary | ICD-10-CM | POA: Diagnosis not present

## 2024-04-01 NOTE — Progress Notes (Signed)
 Complex Care Management Note Care Guide Note  04/01/2024 Name: Natasha [name] MRN: [id number] DOB: 1950-12-30   Complex Care Management Outreach Attempts: An unsuccessful telephone outreach was attempted today to offer the patient information about available complex care management services.  Follow Up Plan:  Additional outreach attempts will be made to offer the patient complex care management information and services.   Encounter Outcome:  No Answer  Leotis Rase [facility], [facility] Guide  Direct Dial: [phone]  Fax [phone]

## 2024-04-02 ENCOUNTER — Ambulatory Visit: Admitting: Occupational Therapy

## 2024-04-02 NOTE — Progress Notes (Signed)
 Complex Care Management Note Care Guide Note  04/02/2024 Name: Natasha [name] MRN: [id number] DOB: 12-21-1950   Complex Care Management Outreach Attempts: A second unsuccessful outreach was attempted today to offer the patient with information about available complex care management services.  Follow Up Plan:  Additional outreach attempts will be made to offer the patient complex care management information and services.   Encounter Outcome:  No Answer  Leotis Rase [facility], [facility] Guide  Direct Dial: [phone]  Fax [phone]

## 2024-04-05 ENCOUNTER — Ambulatory Visit: Admitting: Occupational Therapy

## 2024-04-05 NOTE — Progress Notes (Signed)
 Complex Care Management Note  Care Guide Note 04/05/2024 Name: [name] MRN: [id number] DOB: 04/04/1951  Natasha [name] is a 73 y.o. year old female who sees Early, Camie BRAVO, NP for primary care. I reached out to Natasha [name] by phone today to offer complex care management services.  Natasha [name] was given information about Complex Care Management services today including:   The Complex Care Management services include support from the care team which includes your Nurse Care Manager, Clinical Social Worker, or Pharmacist.  The Complex Care Management team is here to help remove barriers to the health concerns and goals most important to you. Complex Care Management services are voluntary, and the patient may decline or stop services at any time by request to their care team member.   Complex Care Management Consent Status: Patient agreed to services and verbal consent obtained.   Follow up plan:  Telephone appointment with complex care management team member scheduled for:  05/07/24 @ 12 PM.   Encounter Outcome:  Patient Scheduled  Leotis Rase [facility], [facility] Guide  Direct Dial: [phone]  Fax [phone]

## 2024-04-07 ENCOUNTER — Ambulatory Visit: Attending: Nurse Practitioner | Admitting: Occupational Therapy

## 2024-04-07 ENCOUNTER — Ambulatory Visit: Admitting: Physical Therapy

## 2024-04-07 ENCOUNTER — Encounter: Payer: Self-pay | Admitting: Occupational Therapy

## 2024-04-07 DIAGNOSIS — R2689 Other abnormalities of gait and mobility: Secondary | ICD-10-CM

## 2024-04-07 DIAGNOSIS — G8929 Other chronic pain: Secondary | ICD-10-CM

## 2024-04-07 DIAGNOSIS — R260 Ataxic gait: Secondary | ICD-10-CM | POA: Diagnosis not present

## 2024-04-07 DIAGNOSIS — R262 Difficulty in walking, not elsewhere classified: Secondary | ICD-10-CM | POA: Insufficient documentation

## 2024-04-07 DIAGNOSIS — M25561 Pain in right knee: Secondary | ICD-10-CM | POA: Insufficient documentation

## 2024-04-07 DIAGNOSIS — M6281 Muscle weakness (generalized): Secondary | ICD-10-CM | POA: Insufficient documentation

## 2024-04-07 DIAGNOSIS — M25562 Pain in left knee: Secondary | ICD-10-CM | POA: Diagnosis not present

## 2024-04-07 DIAGNOSIS — I890 Lymphedema, not elsewhere classified: Secondary | ICD-10-CM | POA: Diagnosis not present
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
## 2024-04-07 NOTE — Therapy (Unsigned)
 OUTPATIENT OCCUPATIONAL THERAPY TREATMENT NOTE  BILATERAL LOWER EXTREMITY/ BLQ LYMPHEDEMA  Patient Name: Natasha [name] MRN: [id number] DOB:Oct 13, 1950, 73 y.o., female Today's Date: 04/07/2024  END OF SESSION:  OT End of Session - 04/07/24 1522     Visit Number 10    Number of Visits 36    Date for OT Re-Evaluation 05/13/24    OT Start Time 0315    Activity Tolerance Patient tolerated treatment well;No increased pain    Behavior During Therapy [facility] for tasks assessed/performed             Past Medical History:  Diagnosis Date   Abdominal spasms 12/14/2020   Acute pain of right shoulder 01/31/2023   Acute pulmonary embolism (HCC) 08/21/2019   Bacterial conjunctivitis of both eyes 12/14/2020   Bilateral leg edema 02/10/2020   Bradycardia    Breast cancer screening by mammogram 11/24/2020   Candida infection 10/11/2022   CARCINOMA, THYROID  GLAND, PAPILLARY 05/04/2008   Stage 2, 8/09: thyroidectomy for 2.7cm papillary adenocarcinoma (t2 n0 mo) 9/09: I-131 rx, 108 mci 05/10: tg is neg (ab neg) , total body scan is neg   Chronic right-sided low back pain with right-sided sciatica 11/24/2020   Colon cancer screening 11/24/2020   Colon polyps    Complication of anesthesia    trouble with airway   Cough 12/25/2007   Qualifier: Diagnosis of  By: Germaine LPN, Megan     RNCPI-80 08/19/2019   Decreased ROM of neck 01/31/2023   Diverticulosis    DVT (deep venous thrombosis) (HCC)    Edema 12/22/2008   Encounter to establish care 11/24/2020   Groin pain, chronic, right 11/24/2020   Health care maintenance 11/26/2022   Hip pain 12/22/2020   History of 2019 novel coronavirus disease (COVID-19) 11/09/2019   HYPERTENSION 12/25/2007   HYPOTHYROIDISM, POSTSURGICAL 05/04/2008   LEG PAIN, LEFT 12/09/2008   Low TSH level 12/10/2022   Lumbago with sciatica, right side 01/08/2021   Memory loss due to medical condition 11/09/2019   Muscle weakness    Nausea and vomiting 05/23/2008    Qualifier: Diagnosis of  By: Kassie MD, Alyce LABOR   Formatting of this note might be different from the original. Qualifier: Diagnosis of  By: Kassie MD, Sean A   Neck pain 01/31/2023   Non-recurrent acute suppurative otitis media of left ear without spontaneous rupture of tympanic membrane 02/17/2023   Numbness and tingling    Obesity, morbid, BMI 50 or higher (HCC) 08/24/2019   OSA (obstructive sleep apnea) 06/15/2013   CPAP   Paresthesia 01/04/2020   Peripheral edema 12/22/2008   Qualifier: Diagnosis of  By: Delford, MD, CODY Maude Dunnings    Pneumonia due to COVID-19 virus    Pulmonary embolism (HCC)    Pulmonary embolism (HCC) 08/23/2019   Right lower quadrant pain 11/24/2020   Sciatica of left side 12/04/2011   Skin sensation disturbance 12/05/2008   Qualifier: Diagnosis of  By: Inocencio MD, Berwyn LABOR Deal of this note might be different from the original. Qualifier: Diagnosis of  By: Inocencio MD, Valerie A   Snoring 12/10/2022   Spasm of muscle of lower back 12/14/2020   Upper back pain 01/31/2023   Vaginal candidiasis 03/16/2021   Weakness 01/04/2020   Past Surgical History:  Procedure Laterality Date   ABDOMINAL HYSTERECTOMY     due to bleeding, ovaries remain   ANKLE SURGERY Right    CERVICAL FUSION     x 2   COLONOSCOPY WITH PROPOFOL   N/A 08/08/2015   Procedure: COLONOSCOPY WITH PROPOFOL ;  Surgeon: Renaye Sous, MD;  Location: [facility];  Service: Endoscopy;  Laterality: N/A;   KNEE SURGERY Left    TOTAL THYROIDECTOMY     Patient Active Problem List   Diagnosis Date Noted   Anemia 03/04/2024   Chronic venous insufficiency 09/10/2023   Weakness of both lower extremities 09/10/2023   Atrophic vaginitis 05/14/2023   Pelvic floor dysfunction 05/14/2023   Pancreatic insufficiency 05/14/2023   Arthritis of knee 03/24/2023   At risk for fall due to comorbid condition 03/10/2023   Mood changes 02/17/2023   Recurrent urinary tract infection 01/31/2023   Walker  as ambulation aid 01/31/2023   B12 deficiency 11/26/2022   Other fatigue 11/26/2022   Prediabetes 11/26/2022   Obesity, Class III, BMI 40-49.9 (morbid obesity) 11/26/2022   Dermatofibroma 10/11/2022   Fatty liver 04/20/2021   Incontinence of urine in female 04/20/2021   Vitamin D  deficiency 03/16/2021   Constipation 11/24/2020   Gastroesophageal reflux disease 11/24/2020   OAB (overactive bladder) 11/24/2020   Aortic atherosclerosis (HCC) 11/24/2020   Right-sided heart failure (HCC) 11/24/2020   History of pulmonary embolism 02/10/2020   Lymphedema 02/10/2020   Mobility impaired 01/04/2020   Muscle weakness (generalized) 11/09/2019   Pulmonary nodule 11/09/2019   OSA (obstructive sleep apnea) 06/15/2013   Allergic rhinitis 01/21/2009   History of papillary adenocarcinoma of thyroid  05/04/2008   Hypothyroidism, postsurgical 05/04/2008   Essential hypertension 12/25/2007   SOB (shortness of breath) on exertion 12/25/2007    PCP: Camie Doing, NP  REFERRING PROVIDER: Camie Doing, NP  REFERRING DIAG: I89.0  THERAPY DIAG:  Lymphedema, not elsewhere classified  Rationale for Evaluation and Treatment: Rehabilitation  ONSET DATE: >15 years  SUBJECTIVE:                                                                                                                                                                                          SUBJECTIVE STATEMENT: [name] presents to OT in manual transport wc for LE care.Pt is accompanied by daughter. Pt is 15 minutes late for a 60 minutes session, so session was abbreviated today. Pt has a PT session after OT today.  Pt tells me she has been able wrap daily with assistance from her daughter. Pt states she has been using the compression pump on the LLE, the yet to be treated leg. Pt reporting 6/10 knee pain and increased swelling in the L Leg.  PERTINENT HISTORY:  Hypothyroidism, postsurgical Essential hypertension Dyspnea OSA  (obstructive sleep apnea) Obesity, morbid, BMI 50 or higher (HCC) Muscle weakness (generalized) Gait abnormality History of pulmonary embolism  Bilateral lower extremity edema Chronic right-sided low back pain with right-sided sciatica Right-sided heart failure (HCC) Hip pain Incontinence of urine in female Dermatofibroma Morbid obesity (HCC) BMI 50.0-59.9, adult (HCC) SOB (shortness of breath) on exertion Other fatigue Prediabetes Decreased ROM of neck Upper back pain Acute pain of right shoulder Recurrent urinary tract infection Urine frequency Walker as ambulation aid Ambulatory dysfunction Mood changes At risk for fall due to comorbid condition Arthritis of knee Hypothyroidism Chronic venous insufficiency Mobility impaired Weakness of both lower extremities Gait instability Lymphedema BMI 45.0-49.9, adult (HCC)  PAIN:  Are you having pain? Yes, B knees: NPRS scale: 6/10 Pain location:  Pain description: creaky crackly, tight, heavy, sore, tender. With and without weight bearing Aggravating factors: standing, walking, weight bearing Relieving factors: walking  PRECAUTIONS: Other: LYMPHEDEMA PRECAUTIONS: prediabetic, hypothyroid,  RED FLAGS: Urinary incontinence; numbness and tingling in fingers, hands and toes   WEIGHT BEARING RESTRICTIONS: No  FALLS:  Has patient fallen in last 6 months? No  LIVING ENVIRONMENT: Lives with: lives with their daughter and  granddaughter Lives in: House/apartment Stairs: Yes; Internal: 14 steps; on right going up and External: garage 4 steps steps; can reach both Has following equipment at home: Walker - 4 wheeled, shower chair, and elevated toilet seat  OCCUPATION: retired Careers information officer professor  LEISURE: concerts, writing and producing plays  HAND DOMINANCE: right   PRIOR LEVEL OF FUNCTION: Independent  PATIENT GOALS: walk unassisted; lose weight   OBJECTIVE: Note: Objective measures were completed at Evaluation  unless otherwise noted.  COGNITION:  Overall cognitive status: Within functional limits for tasks assessed   OBSERVATIONS / OTHER ASSESSMENTS:   POSTURE: head forward, slight shoulder protraction  LE ROM: Limited at hips knees and ankles 2/2 body habitus and skin approximation  LE MMT: generalized weakness  LYMPHEDEMA ASSESSMENTS:   BLE COMPARATIVE LIMB VOLUMETRICS: Initial 02/17/24  LANDMARK RIGHT   R LEG (A-D) 6530.2 ml  R THIGH (E-G) ml  R FULL LIMB (A-G) ml  Limb Volume differential (LVD)  %  Volume change since initial %  Volume change overall V  (Blank rows = not tested)  LANDMARK LEFT  L LEG (A-D) 6601.0  L  THIGH (E-G) ml  L  FULL LIMB (A-G) ml  Limb Volume differential (LVD)  1.08% L>R  Volume change since initial %  Volume change overall %  (Blank rows = not tested)    RLE COMPARATIVE LIMB VOLUMETRICS: 9th visit 03/31/24   LANDMARK RIGHT   R LEG (A-D) 4941.4 ml  R THIGH (E-G) ml  R FULL LIMB (A-G) ml  Limb Volume differential (LVD)  %  Volume change since initial R LEG volume DECREASED by 24.3% since commencing OT for CDT on 02/17/24   Volume change overall V  (Blank rows = not tested)    SKIN/TISSUE INTEGRITY: : Moderate, Stage  II, Bilateral Lower Extremity Lymphedema 2/2 CVI,  Obesity, and Mm weakness  Skin  Description Hyper-Keratosis Peau d' Orange Shiny Tight Fibrotic/ Indurated Fatty Hard Spongy/ boggy   x   x R>L x x x   Skin dry Flaky WNL Macerated   mildly      Color Redness Varicosities Blanching Hemosiderin Stain Mottled        x   Odor Malodorous Yeast Fungal infection  WNL      x   Temperature Warm Cool wnl    x     Pitting Edema   1+ 2+ 3+ 4+ Non-pitting   x  Girth Symmetrical Asymmetrical                   Distribution    R>L toes to groin, bilaterally    Stemmer Sign Positive Negative   +    Lymphorrhea History Of:  Present Absent     x    Wounds History Of Present Absent Venous Arterial Pressure  Sheer   denies  x        Signs of Infection Redness Warmth Erythema Acute Swelling Drainage Borders                    Sensation Light Touch Deep pressure Hypersensitivity   In tact Impaired In tact Impaired Absent Impaired   x  x  x     Nails WNL   Fungus nail dystrophy   TBA     Hair Growth Symmetrical Asymmetrical   TB Ax    Skin Creases Base of toes  Ankles   Base of Fingers knees       Abdominal pannus Thigh Lobules  Face/neck   x x  x       GAIT: Pt transported to clinic in transport wheelchair Distance walked: Pt able to transfer out of wc and walk 4-5 steps using 2 wheeled walker to Rx bed with extra time (modified independent).  Assistive device utilized: Pt able to lift legs onto Rx bed, one at a time, using hands to actively assist Level of assistance: Modified independence Comments: Transfers  and bed mobility take an inordinate amount of time  LYMPHEDEMA LIFE IMPACT SCALE (LLIS): Initial: 86.76% (The extent to which LE-related problems impacted your life over the past week.)                                                                                                                           TREATMENT DATE: 02/12/24 RLE MLD Compression wrapping Pt/ CG edu   PATIENT EDUCATION:  Continued Pt/ CG edu for lymphedema self care home program throughout session. Topics include outcome of comparative limb volumetrics- starting limb volume differentials (LVDs), technology and gradient techniques used for short stretch, multilayer compression wrapping, simple self-MLD, therapeutic lymphatic pumping exercises, skin/nail care, LE precautions, compression garment recommendations and specifications, wear and care schedule and compression garment donning / doffing w assistive devices. Discussed progress towards all OT goals since commencing CDT. Discussed detrimental impact of obesity on lower and upper extremity lymphedema over time. Reviewed OT goals for lymphedema care  with Pt and discussed progress to date.  All questions answered to the Pt's satisfaction. Good return. Person educated: Patient and family Education method: Explanation, Demonstration, and Handouts Education comprehension: verbalized understanding, returned demonstration, verbal cues required, and needs further education  LYMPHEDEMA SELF-CARE HOME PROGRAM: BLE lymphatic pumping there ex- 1 set of 10 each element, in order. Hold 5. 2 x daily 2. Daily, short stretch, thigh length, multilayer compression bandages during Intensive Phase CDT 3. During self-Management Phase fit  with appropriate compression garments and/ or devices 3. Daily skin care with low ph lotion matching skin ph 4. Daily simple self MLD   ASSESSMENT:  CLINICAL IMPRESSION: Limb volumes completed today in prep for 10 th visit progress report. Volumetrics reveal R LEG volume DECREASED by 24.3% since commencing OT for CDT on 02/17/24. This value meets and exceeds the initial 10% volume reduction goal for the RLE. Goal elevated to 25% bilaterally. Applied R LE gradient compression wraps, and added doubled, knee length, Tubigrip to L LEG to reduce ankle pain and swelling. A shorter piece of Tubigrip tended to roll down. Next visit check measurements for CircAids   for RLE. Cont as per POC.  (02/12/24 Initial OT Eval : Natasha [name] is a 73 yo female presenting with moderate, stage  II, bilateral lower extremity lymphedema 2/2 CVI,  obesity, and mm weakness. She will benefit from skilled Occupational Therapy to reduce limb swelling and associated pain, to limit progression and infection risk. BLE lymphedema limits functional performance in all occupational domains, including functional mobility and ambulation,  basic and instrumental ADLs, productive activities, leisure pursuits, social participation and quality of life. This patient will benefit from modified Intensive and Self Management Phase CDT . In addition to MLD, ther ex, skin care  and compression bandaging below the knees, Pt will be fitted with custom knee length compression stockings paired with off the shelf , Capri length compression leggings. If insurance benefits allow, she may undergo a trial in the clinic with the advanced Flexitouch device in an effort to reduce discomfort and reduce infection risk. Without skilled OT Li-lymphedema will worsen over time and further functional decline is expected.    Ms Hegel is unable to reach feet and distal legs to apply multilayer , gradient compression wraps during the Intensive Phase of CDT.  She understands this limitation and agrees to  arrange for a caregiver to assist her daily with compression wrapping between OT visits. With a caregiver to assist her with Intensive Phase compression her prognosis is fair. Without CG assistance her prognosis is poor.)  OBJECTIVE IMPAIRMENTS: decreased activity tolerance, decreased balance, decreased endurance, decreased knowledge of condition, decreased knowledge of use of DME, decreased mobility, difficulty walking, decreased ROM, decreased strength, increased edema, impaired UE functional use, postural dysfunction, obesity, pain, and chronic leg swelling.   ACTIVITY LIMITATIONS: ACTIVITY LIMITATIONS: Mobility and functional ambulation limitations:  abnormal gait pattern, difficulty walking, carrying, lifting, bending, sitting, standing, squatting, stairs, transfers, and bed mobility Basic and instrumental ADLs (reaching feet and distal legs to groom nails, inspect skin, apply lotion, bathe lower body, difficulty with LB dressing, including fitting LB clothing, shoes and socks, impaired sleeping, meal prep, standing to cook, driving, shopping, yard work, house work Paediatric nurse activities: work related activities requiring extended standing, walking, sitting; caring for others Social participation in the community, socializing with others, LE affects body image  Leisure pursuits requiring  extended standing, walking, sitting  PERSONAL FACTORS: Fitness, Time since onset of injury/illness/exacerbation, and 3+ comorbidities: Obesity, arthritis, OSA are also affecting patient's functional outcome.   REHAB POTENTIAL: Fair with daily caregiver assistance with gradient compression wrapping. Without assistance prognosis is poor as Pt is unable to apply wraps independently  EVALUATION COMPLEXITY: Moderate   GOALS: Goals reviewed with patient? Yes   SHORT TERM GOALS: Target date: 4th OT Rx visit  Pt will demonstrate understanding of lymphedema precautions and prevention strategies with modified independence using a printed reference to identify at least 5 precautions and  discussing how s/he may implement them into daily life to reduce risk of progression with modified assistance Baseline: max a Goal status:PROGRESSING   2.  With Max caregiver assistance Pt will be able to apply multilayer, knee length, compression wraps using gradient techniques to decrease limb volume, to limit infection risk, and to limit lymphedema progression.  Given this patient's Intake score of tbd % on the Lymphedema Life Impact Scale (LLIS), patient will experience a reduction of at least 5% in her perceived level of functional impairment resulting from lymphedema to improve functional performance and quality of life (QOL). Baseline: Dependent Goal status: 03/31/24 GOAL MET     LONG TERM GOALS: Target date: 05/13/24 Given this patient's Intake score of 86.76 % on the Lymphedema Life Impact Scale (LLIS), patient will experience a reduction of at least 10% in her perceived level of functional impairment resulting from lymphedema to improve functional performance and quality of life (QOL).Baseline: tbd Baseline: max a Goal status: PROGRESSING     2.  With modified independence (extra time and assistive devices) Pt will be able to don and doff appropriate compression garments and/or devices to control BLE  lymphedema and to limit progression.  Baseline: Dependent Goal status: PROGRESSING   3. Pt will achieve at least a 10% volume reductions bilaterally below the knees to return limb to more typical size and shape, to limit infection risk and LE progression, to decrease pain, to improve function, and to improve body image and QOL. Baseline: Dependent Goal status:PROGRESSING. Volumetrics reveal R LEG volume DECREASED by 24.3% since commencing OT for CDT on 02/17/24. This value meets and exceeds the initial 10% volume reduction goal for the RLE. Goal elevated to 25% bilaterally.    4. Pt will achieve and sustain at least 85% compliance with all LE self-care home program components throughout CDT, including modified simple self-MLD, daily skin care and inspection, lymphatic pumping the ex and appropriate compression to limit lymphedema progression and to limit further functional decline. Baseline: Dependent. (Note: W/out daily assistance with donning/ doffing compression  , prognosis is poor.) Goal status:PROGRESSING. Pt hired paid caregiver who assist her with wrapping. Bandaging very well done.   PLAN:  OT FREQUENCY: 2x/week and PRN  OT DURATION: other: 18 weeks and PRN  PLANNED INTERVENTIONS:  Complete Decongestive Therapy (CDT): Manual Lymphatic Drainage (MLD) , Skin care, ther ex, gradient compression 97110-Therapeutic exercises, 97530- Therapeutic activity, 97535- Self Care, [id number]- Manual therapy, Patient/Family education, Manual lymph drainage, Compression bandaging, DME instructions, and trial with advanced, sequential, pneumatic, compression device (Tactile Medical Flexitouch) Pt will arrange for a caregiver to assist her daily with compression wrapping between OT visits.  Custom-made gradient compression garments and HOS devices are medically necessary because they are uniquely sized and shaped to fit the exact dimensions of the affected extremities, and to provide appropriate medical grade,  graduated compression essential for optimally managing chronic, progressive lymphedema. Multiple custom compression garments are needed to ensure proper hygiene to limit infection risk. Custom compression garments should be replaced q 3-6 months When worn consistently for optimal lipo-lymphedema self-management over time. HOS devices, medically necessary to limit fibrosis buildup in tissue, should be replaced q 2 years and PRN when worn out.   PLAN FOR NEXT SESSION:  Pt/ caregiver edu Knee length, Multilayer compression wraps to R leg only Complete anatomical measurements to determine OTS or ready-made CircAids  Zebedee Dec, MS, OTR/L, CLT-LANA 04/07/24 3:24 PM'

## 2024-04-14 ENCOUNTER — Ambulatory Visit: Admitting: Occupational Therapy

## 2024-04-14 DIAGNOSIS — G8929 Other chronic pain: Secondary | ICD-10-CM | POA: Diagnosis not present

## 2024-04-14 DIAGNOSIS — M6281 Muscle weakness (generalized): Secondary | ICD-10-CM | POA: Diagnosis not present

## 2024-04-14 DIAGNOSIS — M25562 Pain in left knee: Secondary | ICD-10-CM | POA: Diagnosis not present

## 2024-04-14 DIAGNOSIS — R2689 Other abnormalities of gait and mobility: Secondary | ICD-10-CM | POA: Diagnosis not present

## 2024-04-14 DIAGNOSIS — R262 Difficulty in walking, not elsewhere classified: Secondary | ICD-10-CM | POA: Diagnosis not present

## 2024-04-14 DIAGNOSIS — R260 Ataxic gait: Secondary | ICD-10-CM | POA: Diagnosis not present

## 2024-04-14 DIAGNOSIS — M25561 Pain in right knee: Secondary | ICD-10-CM | POA: Diagnosis not present

## 2024-04-14 DIAGNOSIS — I890 Lymphedema, not elsewhere classified: Secondary | ICD-10-CM

## 2024-04-14 NOTE — Therapy (Signed)
 OUTPATIENT OCCUPATIONAL THERAPY TREATMENT NOTE   BILATERAL LOWER EXTREMITY/ BLQ LYMPHEDEMA  Patient Name: DENEENE TARVER MRN: [id number] DOB:[birth year], 73 y.o., female Today's Date: 04/15/2024  REPORTING PERIOD:  END OF SESSION:  OT End of Session - 04/14/24 1516     Visit Number 11    Number of Visits 36    Date for OT Re-Evaluation 05/13/24    OT Start Time 0315    OT Stop Time 0415    OT Time Calculation (min) 60 min    Activity Tolerance Patient tolerated treatment well;No increased pain    Behavior During Therapy [facility] for tasks assessed/performed             Past Medical History:  Diagnosis Date   Abdominal spasms 12/14/2020   Acute pain of right shoulder 01/31/2023   Acute pulmonary embolism (HCC) 08/21/2019   Bacterial conjunctivitis of both eyes 12/14/2020   Bilateral leg edema 02/10/2020   Bradycardia    Breast cancer screening by mammogram 11/24/2020   Candida infection 10/11/2022   CARCINOMA, THYROID  GLAND, PAPILLARY 05/04/2008   Stage 2, 8/09: thyroidectomy for 2.7cm papillary adenocarcinoma (t2 n0 mo) 9/09: I-131 rx, 108 mci 05/10: tg is neg (ab neg) , total body scan is neg   Chronic right-sided low back pain with right-sided sciatica 11/24/2020   Colon cancer screening 11/24/2020   Colon polyps    Complication of anesthesia    trouble with airway   Cough 12/25/2007   Qualifier: Diagnosis of  By: Germaine LPN, Megan     RNCPI-80 08/19/2019   Decreased ROM of neck 01/31/2023   Diverticulosis    DVT (deep venous thrombosis) (HCC)    Edema 12/22/2008   Encounter to establish care 11/24/2020   Groin pain, chronic, right 11/24/2020   Health care maintenance 11/26/2022   Hip pain 12/22/2020   History of 2019 novel coronavirus disease (COVID-19) 11/09/2019   HYPERTENSION 12/25/2007   HYPOTHYROIDISM, POSTSURGICAL 05/04/2008   LEG PAIN, LEFT 12/09/2008   Low TSH level 12/10/2022   Lumbago with sciatica, right side 01/08/2021   Memory loss due to  medical condition 11/09/2019   Muscle weakness    Nausea and vomiting 05/23/2008   Qualifier: Diagnosis of  By: Kassie MD, Alyce LABOR   Formatting of this note might be different from the original. Qualifier: Diagnosis of  By: Kassie MD, Sean A   Neck pain 01/31/2023   Non-recurrent acute suppurative otitis media of left ear without spontaneous rupture of tympanic membrane 02/17/2023   Numbness and tingling    Obesity, morbid, BMI 50 or higher (HCC) 08/24/2019   OSA (obstructive sleep apnea) 06/15/2013   CPAP   Paresthesia 01/04/2020   Peripheral edema 12/22/2008   Qualifier: Diagnosis of  By: Delford, MD, CODY Maude Dunnings    Pneumonia due to COVID-19 virus    Pulmonary embolism (HCC)    Pulmonary embolism (HCC) 08/23/2019   Right lower quadrant pain 11/24/2020   Sciatica of left side 12/04/2011   Skin sensation disturbance 12/05/2008   Qualifier: Diagnosis of  By: Inocencio MD, Ber[place] LABOR Deal of this note might be different from the original. Qualifier: Diagnosis of  By: Inocencio MD, Valerie A   Snoring 12/10/2022   Spasm of muscle of lower back 12/14/2020   Upper back pain 01/31/2023   Vaginal candidiasis 03/16/2021   Weakness 01/04/2020   Past Surgical History:  Procedure Laterality Date   ABDOMINAL HYSTERECTOMY     due to bleeding, ovaries remain  ANKLE SURGERY Right    CERVICAL FUSION     x 2   COLONOSCOPY WITH PROPOFOL  N/A 08/08/2015   Procedure: COLONOSCOPY WITH PROPOFOL ;  Surgeon: Renaye Sous, MD;  Location: [facility];  Service: Endoscopy;  Laterality: N/A;   KNEE SURGERY Left    TOTAL THYROIDECTOMY     Patient Active Problem List   Diagnosis Date Noted   Anemia 03/04/2024   Chronic venous insufficiency 09/10/2023   Weakness of both lower extremities 09/10/2023   Atrophic vaginitis 05/14/2023   Pelvic floor dysfunction 05/14/2023   Pancreatic insufficiency 05/14/2023   Arthritis of knee 03/24/2023   At risk for fall due to comorbid condition  03/10/2023   Mood changes 02/17/2023   Recurrent urinary tract infection 01/31/2023   Walker as ambulation aid 01/31/2023   B12 deficiency 11/26/2022   Other fatigue 11/26/2022   Prediabetes 11/26/2022   Obesity, Class III, BMI 40-49.9 (morbid obesity) 11/26/2022   Dermatofibroma 10/11/2022   Fatty liver 04/20/2021   Incontinence of urine in female 04/20/2021   Vitamin D  deficiency 03/16/2021   Constipation 11/24/2020   Gastroesophageal reflux disease 11/24/2020   OAB (overactive bladder) 11/24/2020   Aortic atherosclerosis (HCC) 11/24/2020   Right-sided heart failure (HCC) 11/24/2020   History of pulmonary embolism 02/10/2020   Lymphedema 02/10/2020   Mobility impaired 01/04/2020   Muscle weakness (generalized) 11/09/2019   Pulmonary nodule 11/09/2019   OSA (obstructive sleep apnea) 06/15/2013   Allergic rhinitis 01/21/2009   History of papillary adenocarcinoma of thyroid  05/04/2008   Hypothyroidism, postsurgical 05/04/2008   Essential hypertension 12/25/2007   SOB (shortness of breath) on exertion 12/25/2007    PCP: Camie Doing, NP  REFERRING PROVIDER: Camie Doing, NP  REFERRING DIAG: I89.0  THERAPY DIAG:  Lymphedema, not elsewhere classified  Rationale for Evaluation and Treatment: Rehabilitation  ONSET DATE: >15 years  SUBJECTIVE:                                                                                                                                                                                          SUBJECTIVE STATEMENT: Ms. Brickel presents to OT in manual transport wc for LE care.Pt is accompanied by her granddaughter. Pt states PT is going well. Pt tells me she has been able wrap daily with assistance from her daughter. Pt states she has been using the compression pump on the LLE, the yet to be treated leg. Pt reports 7/10 knee pain without weight bearing. Treated RLE seated in transport wc with leg on footstool to limit transfer time and  pain.  PERTINENT HISTORY:  Hypothyroidism, postsurgical Essential hypertension Dyspnea OSA (obstructive sleep apnea) Obesity, morbid,  BMI 50 or higher (HCC) Muscle weakness (generalized) Gait abnormality History of pulmonary embolism Bilateral lower extremity edema Chronic right-sided low back pain with right-sided sciatica Right-sided heart failure (HCC) Hip pain Incontinence of urine in female Dermatofibroma Morbid obesity (HCC) BMI 50.0-59.9, adult (HCC) SOB (shortness of breath) on exertion Other fatigue Prediabetes Decreased ROM of neck Upper back pain Acute pain of right shoulder Recurrent urinary tract infection Urine frequency Walker as ambulation aid Ambulatory dysfunction Mood changes At risk for fall due to comorbid condition Arthritis of knee Hypothyroidism Chronic venous insufficiency Mobility impaired Weakness of both lower extremities Gait instability Lymphedema BMI 45.0-49.9, adult (HCC)  PAIN:  Are you having pain? Yes, B knees: NPRS scale: 6/10 Pain location:  Pain description: creaky crackly, tight, heavy, sore, tender. With and without weight bearing Aggravating factors: standing, walking, weight bearing Relieving factors: walking  PRECAUTIONS: Other: LYMPHEDEMA PRECAUTIONS: prediabetic, hypothyroid,  RED FLAGS: Urinary incontinence; numbness and tingling in fingers, hands and toes   WEIGHT BEARING RESTRICTIONS: No  FALLS:  Has patient fallen in last 6 months? No  LIVING ENVIRONMENT: Lives with: lives with their daughter and  granddaughter Lives in: House/apartment Stairs: Yes; Internal: 14 steps; on right going up and External: garage 4 steps steps; can reach both Has following equipment at home: Walker - 4 wheeled, shower chair, and elevated toilet seat  OCCUPATION: retired Careers information officer professor  LEISURE: concerts, writing and producing plays  HAND DOMINANCE: right   PRIOR LEVEL OF FUNCTION: Independent  PATIENT GOALS: walk  unassisted; lose weight   OBJECTIVE: Note: Objective measures were completed at Evaluation unless otherwise noted.  COGNITION:  Overall cognitive status: Within functional limits for tasks assessed   OBSERVATIONS / OTHER ASSESSMENTS:   POSTURE: head forward, slight shoulder protraction  LE ROM: Limited at hips knees and ankles 2/2 body habitus and skin approximation  LE MMT: generalized weakness  LYMPHEDEMA ASSESSMENTS:   BLE COMPARATIVE LIMB VOLUMETRICS: Initial 02/17/24  LANDMARK RIGHT   R LEG (A-D) 6530.2 ml  R THIGH (E-G) ml  R FULL LIMB (A-G) ml  Limb Volume differential (LVD)  %  Volume change since initial %  Volume change overall V  (Blank rows = not tested)  LANDMARK LEFT  L LEG (A-D) 6601.0  L  THIGH (E-G) ml  L  FULL LIMB (A-G) ml  Limb Volume differential (LVD)  1.08% L>R  Volume change since initial %  Volume change overall %  (Blank rows = not tested)    RLE COMPARATIVE LIMB VOLUMETRICS: 9th visit 03/31/24   LANDMARK RIGHT   R LEG (A-D) 4941.4 ml  R THIGH (E-G) ml  R FULL LIMB (A-G) ml  Limb Volume differential (LVD)  %  Volume change since initial R LEG volume DECREASED by 24.3% since commencing OT for CDT on 02/17/24   Volume change overall V  (Blank rows = not tested)    SKIN/TISSUE INTEGRITY: : Moderate, Stage  II, Bilateral Lower Extremity Lymphedema 2/2 CVI,  Obesity, and Mm weakness  Skin  Description Hyper-Keratosis Peau d' Orange Shiny Tight Fibrotic/ Indurated Fatty Hard Spongy/ boggy   x   x R>L x x x   Skin dry Flaky WNL Macerated   mildly      Color Redness Varicosities Blanching Hemosiderin Stain Mottled        x   Odor Malodorous Yeast Fungal infection  WNL      x   Temperature Warm Cool wnl    x     Pitting  Edema   1+ 2+ 3+ 4+ Non-pitting   x         Girth Symmetrical Asymmetrical                   Distribution    R>L toes to groin, bilaterally    Stemmer Sign Positive Negative   +    Lymphorrhea History  Of:  Present Absent     x    Wounds History Of Present Absent Venous Arterial Pressure Sheer   denies  x        Signs of Infection Redness Warmth Erythema Acute Swelling Drainage Borders                    Sensation Light Touch Deep pressure Hypersensitivity   In tact Impaired In tact Impaired Absent Impaired   x  x  x     Nails WNL   Fungus nail dystrophy   TBA     Hair Growth Symmetrical Asymmetrical   TB Ax    Skin Creases Base of toes  Ankles   Base of Fingers knees       Abdominal pannus Thigh Lobules  Face/neck   x x  x       GAIT: Pt transported to clinic in transport wheelchair Distance walked: Pt able to transfer out of wc and walk 4-5 steps using 2 wheeled walker to Rx bed with extra time (modified independent).  Assistive device utilized: Pt able to lift legs onto Rx bed, one at a time, using hands to actively assist Level of assistance: Modified independence Comments: Transfers  and bed mobility take an inordinate amount of time  LYMPHEDEMA LIFE IMPACT SCALE (LLIS): Initial: 86.76% (The extent to which LE-related problems impacted your life over the past week.)                                                                                                                           TREATMENT THIS DATE: RLE MLD Compression wrapping Pt/ CG edu   PATIENT EDUCATION:  Continued Pt/ CG edu for lymphedema self care home program throughout session. Topics include outcome of comparative limb volumetrics- starting limb volume differentials (LVDs), technology and gradient techniques used for short stretch, multilayer compression wrapping, simple self-MLD, therapeutic lymphatic pumping exercises, skin/nail care, LE precautions, compression garment recommendations and specifications, wear and care schedule and compression garment donning / doffing w assistive devices. Discussed progress towards all OT goals since commencing CDT. Discussed detrimental impact of obesity  on lower and upper extremity lymphedema over time. Reviewed OT goals for lymphedema care with Pt and discussed progress to date.  All questions answered to the Pt's satisfaction. Good return. Person educated: Patient and family Education method: Explanation, Demonstration, and Handouts Education comprehension: verbalized understanding, returned demonstration, verbal cues required, and needs further education  LYMPHEDEMA SELF-CARE HOME PROGRAM: BLE lymphatic pumping there ex- 1 set of 10 each element, in order. Hold 5. 2 x  daily 2. Daily, short stretch, thigh length, multilayer compression bandages during Intensive Phase CDT 3. During self-Management Phase fit with appropriate compression garments and/ or devices 3. Daily skin care with low ph lotion matching skin ph 4. Daily simple self MLD   ASSESSMENT:  CLINICAL IMPRESSION:Provided MLD to RLE/RLQ from wheel chair level. Pt tolerated well without increased pain. CircAid garment order completed. Cont as per POC.Cont as per POC.  (02/12/24 Initial OT Eval : LORETTA DOUTT is a 73 yo female presenting with moderate, stage  II, bilateral lower extremity lymphedema 2/2 CVI,  obesity, and mm weakness. She will benefit from skilled Occupational Therapy to reduce limb swelling and associated pain, to limit progression and infection risk. BLE lymphedema limits functional performance in all occupational domains, including functional mobility and ambulation,  basic and instrumental ADLs, productive activities, leisure pursuits, social participation and quality of life. This patient will benefit from modified Intensive and Self Management Phase CDT . In addition to MLD, ther ex, skin care and compression bandaging below the knees, Pt will be fitted with custom knee length compression stockings paired with off the shelf , Capri length compression leggings. If insurance benefits allow, she may undergo a trial in the clinic with the advanced Flexitouch device in  an effort to reduce discomfort and reduce infection risk. Without skilled OT Li-lymphedema will worsen over time and further functional decline is expected.    Ms Hustead is unable to reach feet and distal legs to apply multilayer , gradient compression wraps during the Intensive Phase of CDT.  She understands this limitation and agrees to  arrange for a caregiver to assist her daily with compression wrapping between OT visits. With a caregiver to assist her with Intensive Phase compression her prognosis is fair. Without CG assistance her prognosis is poor.)  OBJECTIVE IMPAIRMENTS: decreased activity tolerance, decreased balance, decreased endurance, decreased knowledge of condition, decreased knowledge of use of DME, decreased mobility, difficulty walking, decreased ROM, decreased strength, increased edema, impaired UE functional use, postural dysfunction, obesity, pain, and chronic leg swelling.   ACTIVITY LIMITATIONS: ACTIVITY LIMITATIONS: Mobility and functional ambulation limitations:  abnormal gait pattern, difficulty walking, carrying, lifting, bending, sitting, standing, squatting, stairs, transfers, and bed mobility Basic and instrumental ADLs (reaching feet and distal legs to groom nails, inspect skin, apply lotion, bathe lower body, difficulty with LB dressing, including fitting LB clothing, shoes and socks, impaired sleeping, meal prep, standing to cook, driving, shopping, yard work, house work Paediatric nurse activities: work related activities requiring extended standing, walking, sitting; caring for others Social participation in the community, socializing with others, LE affects body image  Leisure pursuits requiring extended standing, walking, sitting  PERSONAL FACTORS: Fitness, Time since onset of injury/illness/exacerbation, and 3+ comorbidities: Obesity, arthritis, OSA are also affecting patient's functional outcome.   REHAB POTENTIAL: Fair with daily caregiver assistance with gradient  compression wrapping. Without assistance prognosis is poor as Pt is unable to apply wraps independently  EVALUATION COMPLEXITY: Moderate   GOALS: Goals reviewed with patient? Yes   SHORT TERM GOALS: Target date: 4th OT Rx visit  Pt will demonstrate understanding of lymphedema precautions and prevention strategies with modified independence using a printed reference to identify at least 5 precautions and discussing how s/he may implement them into daily life to reduce risk of progression with modified assistance Baseline: max a Goal status:PROGRESSING   2.  With Max caregiver assistance Pt will be able to apply multilayer, knee length, compression wraps using gradient techniques to decrease limb  volume, to limit infection risk, and to limit lymphedema progression.  Given this patient's Intake score of tbd % on the Lymphedema Life Impact Scale (LLIS), patient will experience a reduction of at least 5% in her perceived level of functional impairment resulting from lymphedema to improve functional performance and quality of life (QOL). Baseline: Dependent Goal status: 03/31/24 GOAL MET     LONG TERM GOALS: Target date: 05/13/24 Given this patient's Intake score of 86.76 % on the Lymphedema Life Impact Scale (LLIS), patient will experience a reduction of at least 10% in her perceived level of functional impairment resulting from lymphedema to improve functional performance and quality of life (QOL).Baseline: tbd Baseline: max a Goal status: PROGRESSING     2.  With modified independence (extra time and assistive devices) Pt will be able to don and doff appropriate compression garments and/or devices to control BLE lymphedema and to limit progression.  Baseline: Dependent Goal status: PROGRESSING   3. Pt will achieve at least a 10% volume reductions bilaterally below the knees to return limb to more typical size and shape, to limit infection risk and LE progression, to decrease pain, to improve  function, and to improve body image and QOL. Baseline: Dependent Goal status:PROGRESSING. Volumetrics reveal R LEG volume DECREASED by 24.3% since commencing OT for CDT on 02/17/24. This value meets and exceeds the initial 10% volume reduction goal for the RLE. Goal elevated to 25% bilaterally.    4. Pt will achieve and sustain at least 85% compliance with all LE self-care home program components throughout CDT, including modified simple self-MLD, daily skin care and inspection, lymphatic pumping the ex and appropriate compression to limit lymphedema progression and to limit further functional decline. Baseline: Dependent. (Note: W/out daily assistance with donning/ doffing compression  , prognosis is poor.) Goal status:PROGRESSING. Pt hired paid caregiver who assist her with wrapping. Bandaging very well done.   PLAN:  OT FREQUENCY: 2x/week and PRN  OT DURATION: other: 18 weeks and PRN  PLANNED INTERVENTIONS:  Complete Decongestive Therapy (CDT): Manual Lymphatic Drainage (MLD) , Skin care, ther ex, gradient compression 97110-Therapeutic exercises, 97530- Therapeutic activity, 97535- Self Care, [id number]- Manual therapy, Patient/Family education, Manual lymph drainage, Compression bandaging, DME instructions, and trial with advanced, sequential, pneumatic, compression device (Tactile Medical Flexitouch) Pt will arrange for a caregiver to assist her daily with compression wrapping between OT visits.   PLAN FOR NEXT SESSION:  Pt/ caregiver edu Knee length, Multilayer compression wraps to R leg only Complete anatomical measurements to determine OTS or ready-made CircAids  Zebedee Dec, MS, OTR/L, CLT-LANA 04/15/24 2:12 PM' OUTPATIENT OCCUPATIONAL THERAPY TREATMENT NOTE AND PROGRESS REPORT  BILATERAL LOWER EXTREMITY/ BLQ LYMPHEDEMA  Patient Name: NALAYAH HITT MRN: [id number] DOB:May 08, 1951, 73 y.o., female Today's Date: 04/15/2024  REPORTING PERIOD: 02/12/24 -  04/07/24  END OF SESSION:   OT End of Session - 04/14/24 1516     Visit Number 11    Number of Visits 36    Date for OT Re-Evaluation 05/13/24    OT Start Time 0315    OT Stop Time 0415    OT Time Calculation (min) 60 min    Activity Tolerance Patient tolerated treatment well;No increased pain    Behavior During Therapy [facility] for tasks assessed/performed             Past Medical History:  Diagnosis Date   Abdominal spasms 12/14/2020   Acute pain of right shoulder 01/31/2023   Acute pulmonary embolism (HCC) 08/21/2019   Bacterial  conjunctivitis of both eyes 12/14/2020   Bilateral leg edema 02/10/2020   Bradycardia    Breast cancer screening by mammogram 11/24/2020   Candida infection 10/11/2022   CARCINOMA, THYROID  GLAND, PAPILLARY 05/04/2008   Stage 2, 8/09: thyroidectomy for 2.7cm papillary adenocarcinoma (t2 n0 mo) 9/09: I-131 rx, 108 mci 05/10: tg is neg (ab neg) , total body scan is neg   Chronic right-sided low back pain with right-sided sciatica 11/24/2020   Colon cancer screening 11/24/2020   Colon polyps    Complication of anesthesia    trouble with airway   Cough 12/25/2007   Qualifier: Diagnosis of  By: Germaine LPN, Megan     RNCPI-80 08/19/2019   Decreased ROM of neck 01/31/2023   Diverticulosis    DVT (deep venous thrombosis) (HCC)    Edema 12/22/2008   Encounter to establish care 11/24/2020   Groin pain, chronic, right 11/24/2020   Health care maintenance 11/26/2022   Hip pain 12/22/2020   History of 2019 novel coronavirus disease (COVID-19) 11/09/2019   HYPERTENSION 12/25/2007   HYPOTHYROIDISM, POSTSURGICAL 05/04/2008   LEG PAIN, LEFT 12/09/2008   Low TSH level 12/10/2022   Lumbago with sciatica, right side 01/08/2021   Memory loss due to medical condition 11/09/2019   Muscle weakness    Nausea and vomiting 05/23/2008   Qualifier: Diagnosis of  By: Kassie MD, Alyce LABOR   Formatting of this note might be different from the original. Qualifier: Diagnosis of  By: Kassie MD, Sean A    Neck pain 01/31/2023   Non-recurrent acute suppurative otitis media of left ear without spontaneous rupture of tympanic membrane 02/17/2023   Numbness and tingling    Obesity, morbid, BMI 50 or higher (HCC) 08/24/2019   OSA (obstructive sleep apnea) 06/15/2013   CPAP   Paresthesia 01/04/2020   Peripheral edema 12/22/2008   Qualifier: Diagnosis of  By: Delford, MD, CODY Maude Dunnings    Pneumonia due to COVID-19 virus    Pulmonary embolism (HCC)    Pulmonary embolism (HCC) 08/23/2019   Right lower quadrant pain 11/24/2020   Sciatica of left side 12/04/2011   Skin sensation disturbance 12/05/2008   Qualifier: Diagnosis of  By: Inocencio MD, [place] LABOR Deal of this note might be different from the original. Qualifier: Diagnosis of  By: Inocencio MD, Valerie A   Snoring 12/10/2022   Spasm of muscle of lower back 12/14/2020   Upper back pain 01/31/2023   Vaginal candidiasis 03/16/2021   Weakness 01/04/2020   Past Surgical History:  Procedure Laterality Date   ABDOMINAL HYSTERECTOMY     due to bleeding, ovaries remain   ANKLE SURGERY Right    CERVICAL FUSION     x 2   COLONOSCOPY WITH PROPOFOL  N/A 08/08/2015   Procedure: COLONOSCOPY WITH PROPOFOL ;  Surgeon: Renaye Sous, MD;  Location: [facility];  Service: Endoscopy;  Laterality: N/A;   KNEE SURGERY Left    TOTAL THYROIDECTOMY     Patient Active Problem List   Diagnosis Date Noted   Anemia 03/04/2024   Chronic venous insufficiency 09/10/2023   Weakness of both lower extremities 09/10/2023   Atrophic vaginitis 05/14/2023   Pelvic floor dysfunction 05/14/2023   Pancreatic insufficiency 05/14/2023   Arthritis of knee 03/24/2023   At risk for fall due to comorbid condition 03/10/2023   Mood changes 02/17/2023   Recurrent urinary tract infection 01/31/2023   Walker as ambulation aid 01/31/2023   B12 deficiency 11/26/2022   Other fatigue 11/26/2022   Prediabetes  11/26/2022   Obesity, Class III, BMI 40-49.9 (morbid  obesity) 11/26/2022   Dermatofibroma 10/11/2022   Fatty liver 04/20/2021   Incontinence of urine in female 04/20/2021   Vitamin D  deficiency 03/16/2021   Constipation 11/24/2020   Gastroesophageal reflux disease 11/24/2020   OAB (overactive bladder) 11/24/2020   Aortic atherosclerosis (HCC) 11/24/2020   Right-sided heart failure (HCC) 11/24/2020   History of pulmonary embolism 02/10/2020   Lymphedema 02/10/2020   Mobility impaired 01/04/2020   Muscle weakness (generalized) 11/09/2019   Pulmonary nodule 11/09/2019   OSA (obstructive sleep apnea) 06/15/2013   Allergic rhinitis 01/21/2009   History of papillary adenocarcinoma of thyroid  05/04/2008   Hypothyroidism, postsurgical 05/04/2008   Essential hypertension 12/25/2007   SOB (shortness of breath) on exertion 12/25/2007    PCP: Camie Doing, NP  REFERRING PROVIDER: Camie Doing, NP  REFERRING DIAG: I89.0  THERAPY DIAG:  Lymphedema, not elsewhere classified  Rationale for Evaluation and Treatment: Rehabilitation  ONSET DATE: >15 years  SUBJECTIVE:                                                                                                                                                                                          SUBJECTIVE STATEMENT: Ms. Disbro presents to OT in manual transport wc for LE care.Pt is accompanied by daughter. Pt is 15 minutes late for a 60 minutes session. Pt has a PT session after OT today.  Pt tells me she has been able wrap daily with assistance from her daughter. Pt states she has been using the compression pump on the LLE, the yet to be treated leg. Pt reports 6/10 knee pain with transfers and increased swelling in the L Leg today.  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 Right-sided heart failure (HCC) Hip pain Incontinence of urine in female Dermatofibroma Morbid obesity (HCC) BMI 50.0-59.9, adult (HCC) SOB (shortness of breath) on exertion Other fatigue Prediabetes Decreased ROM of neck Upper back pain Acute pain of right shoulder Recurrent urinary tract infection Urine frequency Walker as ambulation aid Ambulatory dysfunction Mood changes At risk for fall due to comorbid condition Arthritis of knee Hypothyroidism Chronic venous insufficiency Mobility impaired Weakness of both lower extremities Gait instability Lymphedema BMI 45.0-49.9, adult (HCC)  PAIN:  Are you having pain? Yes, B knees: NPRS scale: 6/10 Pain location:  Pain description: creaky crackly, tight, heavy, sore, tender. With and without weight bearing Aggravating factors: standing, walking, weight bearing Relieving  factors: walking  PRECAUTIONS: Other: LYMPHEDEMA PRECAUTIONS: prediabetic, hypothyroid,  RED FLAGS: Urinary incontinence; numbness and tingling in fingers, hands and toes   WEIGHT BEARING RESTRICTIONS: No  FALLS:  Has patient fallen in last 6 months? No  LIVING ENVIRONMENT: Lives with: lives with their daughter and  granddaughter Lives in: House/apartment Stairs: Yes; Internal: 14 steps; on right going up and External: garage 4 steps steps; can reach both Has following equipment at home: Walker - 4 wheeled, shower chair, and elevated toilet seat  OCCUPATION: retired Careers information officer professor  LEISURE: concerts, writing and producing plays  HAND DOMINANCE: right   PRIOR LEVEL OF FUNCTION: Independent  PATIENT GOALS: walk unassisted; lose weight   OBJECTIVE: Note: Objective measures were completed at Evaluation unless otherwise noted.  COGNITION:  Overall cognitive status: Within functional limits for tasks assessed   OBSERVATIONS / OTHER ASSESSMENTS:   POSTURE: head forward,  slight shoulder protraction  LE ROM: Limited at hips knees and ankles 2/2 body habitus and skin approximation  LE MMT: generalized weakness  LYMPHEDEMA ASSESSMENTS:   BLE COMPARATIVE LIMB VOLUMETRICS: Initial 02/17/24  LANDMARK RIGHT   R LEG (A-D) 6530.2 ml  R THIGH (E-G) ml  R FULL LIMB (A-G) ml  Limb Volume differential (LVD)  %  Volume change since initial %  Volume change overall V  (Blank rows = not tested)  LANDMARK LEFT  L LEG (A-D) 6601.0  L  THIGH (E-G) ml  L  FULL LIMB (A-G) ml  Limb Volume differential (LVD)  1.08% L>R  Volume change since initial %  Volume change overall %  (Blank rows = not tested)    RLE COMPARATIVE LIMB VOLUMETRICS: 9th visit 03/31/24   LANDMARK RIGHT   R LEG (A-D) 4941.4 ml  R THIGH (E-G) ml  R FULL LIMB (A-G) ml  Limb Volume differential (LVD)  %  Volume change since initial R LEG volume DECREASED by 24.3% since commencing OT for CDT on 02/17/24   Volume change overall V  (Blank rows = not tested)    SKIN/TISSUE INTEGRITY: : Moderate, Stage  II, Bilateral Lower Extremity Lymphedema 2/2 CVI,  Obesity, and Mm weakness  Skin  Description Hyper-Keratosis Peau d' Orange Shiny Tight Fibrotic/ Indurated Fatty Hard Spongy/ boggy   x   x R>L x x x   Skin dry Flaky WNL Macerated   mildly      Color Redness Varicosities Blanching Hemosiderin Stain Mottled        x   Odor Malodorous Yeast Fungal infection  WNL      x   Temperature Warm Cool wnl    x     Pitting Edema   1+ 2+ 3+ 4+ Non-pitting   x         Girth Symmetrical Asymmetrical                   Distribution    R>L toes to groin, bilaterally    Stemmer Sign Positive Negative   +    Lymphorrhea History Of:  Present Absent     x    Wounds History Of Present Absent Venous Arterial Pressure Sheer   denies  x        Signs of Infection Redness Warmth Erythema Acute Swelling Drainage Borders                    Sensation Light Touch Deep pressure  Hypersensitivity   In tact Impaired In tact Impaired Absent Impaired  x  x  x     Nails WNL   Fungus nail dystrophy   TBA     Hair Growth Symmetrical Asymmetrical   TB Ax    Skin Creases Base of toes  Ankles   Base of Fingers knees       Abdominal pannus Thigh Lobules  Face/neck   x x  x       GAIT: Pt transported to clinic in transport wheelchair Distance walked: Pt able to transfer out of wc and walk 4-5 steps using 2 wheeled walker to Rx bed with extra time (modified independent).  Assistive device utilized: Pt able to lift legs onto Rx bed, one at a time, using hands to actively assist Level of assistance: Modified independence Comments: Transfers  and bed mobility take an inordinate amount of time  LYMPHEDEMA LIFE IMPACT SCALE (LLIS): Initial: 86.76% (The extent to which LE-related problems impacted your life over the past week.)                                                                                                                           TREATMENT THIS DATE: RLE MLD Compression wrapping Pt/ CG edu   PATIENT EDUCATION:  Continued Pt/ CG edu for lymphedema self care home program throughout session. Topics include outcome of comparative limb volumetrics- starting limb volume differentials (LVDs), technology and gradient techniques used for short stretch, multilayer compression wrapping, simple self-MLD, therapeutic lymphatic pumping exercises, skin/nail care, LE precautions, compression garment recommendations and specifications, wear and care schedule and compression garment donning / doffing w assistive devices. Discussed progress towards all OT goals since commencing CDT. Discussed detrimental impact of obesity on lower and upper extremity lymphedema over time. Reviewed OT goals for lymphedema care with Pt and discussed progress to date.  All questions answered to the Pt's satisfaction. Good return. Person educated: Patient and family Education method:  Explanation, Demonstration, and Handouts Education comprehension: verbalized understanding, returned demonstration, verbal cues required, and needs further education  LYMPHEDEMA SELF-CARE HOME PROGRAM: BLE lymphatic pumping there ex- 1 set of 10 each element, in order. Hold 5. 2 x daily 2. Daily, short stretch, thigh length, multilayer compression bandages during Intensive Phase CDT 3. During self-Management Phase fit with appropriate compression garments and/ or devices 3. Daily skin care with low ph lotion matching skin ph 4. Daily simple self MLD   ASSESSMENT:  CLINICAL IMPRESSION:Pt continues to make slow but steady progress towards OT goals for lymphedema care. Please refer to GOALS section below for detailed report on each goal. To date R LEG volume is DECREASED by 24.3% since commencing OT for CDT on 02/17/24. This value meets and exceeds the initial 10% volume reduction goal for the RLE. Goal elevated to 25% bilaterally. Applied R LE gradient compression wraps. We were unable to finalize measurements and specifications for CircAids today due to time constraints, but we were able to determine that Pt does fit into a ready to wear size.  We'll submit measurements to DME vendor after next session when we complete R leg measurements. We'll shift Rx to LLE after RLE garment is fitted. LLE remains very swollen, dense and hard to palpation.   Pt is ready to be fit with custom RLE , knee length, adjustable, Velcro wrap-style compression legging, or CircAid Juxtafit Essential. The LLE treatment and fitting will commence after RLE garment fitting is complete. The CircAid legging is needed to enable patient to don and doff the legging with the highest level of independence possible. It is also caregiver friendly. This garment is preferred because it is the only manufactured adjustable garment with a built in gauging system that enables Pts to see when they have applied correct amount of containment and  compression to control their lymphedema.   Custom-made gradient compression garments and HOS devices are medically necessary because they are uniquely sized and shaped to fit the exact dimensions of the affected extremities, and to provide appropriate medical grade, graduated compression essential for optimally managing chronic, progressive lymphedema. Multiple custom compression garments are needed to ensure proper hygiene to limit infection risk. Custom compression garments should be replaced q 3-6 months when worn consistently for optimal lymphedema self-management over time they will wear out and require replacement. HOS devices, medically necessary to limit fibrosis buildup in tissue, should be replaced q 2 years and PRN when worn out.   Transfers from transport wc require an inordinate amount of time for this patient, and increases pain in her knees substantially. Because transfers significantly cut into manual therapy time, we will attempt to treat patient in wheelchair at next visit with her leg on  an adjustable height footstool. Cont as per POC.  (02/12/24 Initial OT Eval : SUELLYN MEENAN is a 73 yo female presenting with moderate, stage  II, bilateral lower extremity lymphedema 2/2 CVI,  obesity, and mm weakness. She will benefit from skilled Occupational Therapy to reduce limb swelling and associated pain, to limit progression and infection risk. BLE lymphedema limits functional performance in all occupational domains, including functional mobility and ambulation,  basic and instrumental ADLs, productive activities, leisure pursuits, social participation and quality of life. This patient will benefit from modified Intensive and Self Management Phase CDT . In addition to MLD, ther ex, skin care and compression bandaging below the knees, Pt will be fitted with custom knee length compression stockings paired with off the shelf , Capri length compression leggings. If insurance benefits allow, she may  undergo a trial in the clinic with the advanced Flexitouch device in an effort to reduce discomfort and reduce infection risk. Without skilled OT Li-lymphedema will worsen over time and further functional decline is expected.    Ms Curran is unable to reach feet and distal legs to apply multilayer , gradient compression wraps during the Intensive Phase of CDT.  She understands this limitation and agrees to  arrange for a caregiver to assist her daily with compression wrapping between OT visits. With a caregiver to assist her with Intensive Phase compression her prognosis is fair. Without CG assistance her prognosis is poor.)  OBJECTIVE IMPAIRMENTS: decreased activity tolerance, decreased balance, decreased endurance, decreased knowledge of condition, decreased knowledge of use of DME, decreased mobility, difficulty walking, decreased ROM, decreased strength, increased edema, impaired UE functional use, postural dysfunction, obesity, pain, and chronic leg swelling.   ACTIVITY LIMITATIONS: ACTIVITY LIMITATIONS: Mobility and functional ambulation limitations:  abnormal gait pattern, difficulty walking, carrying, lifting, bending, sitting, standing, squatting, stairs, transfers, and bed mobility  Basic and instrumental ADLs (reaching feet and distal legs to groom nails, inspect skin, apply lotion, bathe lower body, difficulty with LB dressing, including fitting LB clothing, shoes and socks, impaired sleeping, meal prep, standing to cook, driving, shopping, yard work, house work Paediatric nurse activities: work related activities requiring extended standing, walking, sitting; caring for others Social participation in the community, socializing with others, LE affects body image  Leisure pursuits requiring extended standing, walking, sitting  PERSONAL FACTORS: Fitness, Time since onset of injury/illness/exacerbation, and 3+ comorbidities: Obesity, arthritis, OSA are also affecting patient's functional outcome.    REHAB POTENTIAL: Fair with daily caregiver assistance with gradient compression wrapping. Without assistance prognosis is poor as Pt is unable to apply wraps independently  EVALUATION COMPLEXITY: Moderate   GOALS: Goals reviewed with patient? Yes   SHORT TERM GOALS: Target date: 4th OT Rx visit  Pt will demonstrate understanding of lymphedema precautions and prevention strategies with modified independence using a printed reference to identify at least 5 precautions and discussing how s/he may implement them into daily life to reduce risk of progression with modified assistance Baseline: max a Goal status:PROGRESSING   2.  With Max caregiver assistance Pt will be able to apply multilayer, knee length, compression wraps using gradient techniques to decrease limb volume, to limit infection risk, and to limit lymphedema progression.  Given this patient's Intake score of tbd % on the Lymphedema Life Impact Scale (LLIS), patient will experience a reduction of at least 5% in her perceived level of functional impairment resulting from lymphedema to improve functional performance and quality of life (QOL). Baseline: Dependent Goal status: 03/31/24 GOAL MET     LONG TERM GOALS: Target date: 05/13/24 Given this patient's Intake score of 86.76 % on the Lymphedema Life Impact Scale (LLIS), patient will experience a reduction of at least 10% in her perceived level of functional impairment resulting from lymphedema to improve functional performance and quality of life (QOL).Baseline: tbd Baseline: max a Goal status: PROGRESSING     2.  With modified independence (extra time and assistive devices) Pt will be able to don and doff appropriate compression garments and/or devices to control BLE lymphedema and to limit progression.  Baseline: Dependent Goal status: PROGRESSING   3. Pt will achieve at least a 10% volume reductions bilaterally below the knees to return limb to more typical size and shape, to  limit infection risk and LE progression, to decrease pain, to improve function, and to improve body image and QOL. Baseline: Dependent Goal status:PROGRESSING. Volumetrics reveal R LEG volume DECREASED by 24.3% since commencing OT for CDT on 02/17/24. This value meets and exceeds the initial 10% volume reduction goal for the RLE. Goal elevated to 25% bilaterally.    4. Pt will achieve and sustain at least 85% compliance with all LE self-care home program components throughout CDT, including modified simple self-MLD, daily skin care and inspection, lymphatic pumping the ex and appropriate compression to limit lymphedema progression and to limit further functional decline. Baseline: Dependent. (Note: W/out daily assistance with donning/ doffing compression  , prognosis is poor.) Goal status:PROGRESSING. Pt hired paid caregiver who assist her with wrapping. Bandaging very well done.   PLAN:  OT FREQUENCY: 2x/week and PRN  OT DURATION: other: 18 weeks and PRN  PLANNED INTERVENTIONS:  Complete Decongestive Therapy (CDT): Manual Lymphatic Drainage (MLD) , Skin care, ther ex, gradient compression 97110-Therapeutic exercises, 97530- Therapeutic activity, 97535- Self Care, [id number]- Manual therapy, Patient/Family education, Manual lymph drainage, Compression bandaging, DME instructions, and  trial with advanced, sequential, pneumatic, compression device (Tactile Medical Flexitouch) Pt will arrange for a caregiver to assist her daily with compression wrapping between OT visits.   PLAN FOR NEXT SESSION:  Pt/ caregiver edu Knee length, Multilayer compression wraps to R leg only Complete anatomical measurements to determine OTS or ready-made CircAids  Zebedee Dec, MS, OTR/L, CLT-LANA 04/15/24 2:12 PM'

## 2024-04-15 DIAGNOSIS — F411 Generalized anxiety disorder: Secondary | ICD-10-CM | POA: Diagnosis not present

## 2024-04-16 ENCOUNTER — Encounter: Admitting: Occupational Therapy

## 2024-04-19 ENCOUNTER — Ambulatory Visit: Admitting: Occupational Therapy

## 2024-04-20 DIAGNOSIS — R262 Difficulty in walking, not elsewhere classified: Secondary | ICD-10-CM | POA: Diagnosis not present

## 2024-04-20 DIAGNOSIS — M170 Bilateral primary osteoarthritis of knee: Secondary | ICD-10-CM | POA: Diagnosis not present

## 2024-04-20 DIAGNOSIS — M25561 Pain in right knee: Secondary | ICD-10-CM | POA: Diagnosis not present

## 2024-04-20 DIAGNOSIS — M25562 Pain in left knee: Secondary | ICD-10-CM | POA: Diagnosis not present

## 2024-04-22 ENCOUNTER — Ambulatory Visit: Admitting: Occupational Therapy

## 2024-04-22 ENCOUNTER — Ambulatory Visit

## 2024-04-22 DIAGNOSIS — I890 Lymphedema, not elsewhere classified: Secondary | ICD-10-CM | POA: Diagnosis not present

## 2024-04-22 DIAGNOSIS — G8929 Other chronic pain: Secondary | ICD-10-CM

## 2024-04-22 DIAGNOSIS — R262 Difficulty in walking, not elsewhere classified: Secondary | ICD-10-CM

## 2024-04-22 DIAGNOSIS — R260 Ataxic gait: Secondary | ICD-10-CM

## 2024-04-22 DIAGNOSIS — R2689 Other abnormalities of gait and mobility: Secondary | ICD-10-CM

## 2024-04-22 DIAGNOSIS — M6281 Muscle weakness (generalized): Secondary | ICD-10-CM

## 2024-04-22 DIAGNOSIS — M25561 Pain in right knee: Secondary | ICD-10-CM | POA: Diagnosis not present

## 2024-04-22 DIAGNOSIS — M25562 Pain in left knee: Secondary | ICD-10-CM | POA: Diagnosis not present
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
## 2024-04-22 NOTE — Therapy (Unsigned)
 OUTPATIENT OCCUPATIONAL THERAPY TREATMENT NOTE   BILATERAL LOWER EXTREMITY/ BLQ LYMPHEDEMA  Patient Name: Natasha [name] MRN: [id number] DOB:1951/02/03, 73 y.o., female Today's Date: 04/23/2024  REPORTING PERIOD:  END OF SESSION:  OT End of Session - 04/22/24 1512     Visit Number 12    Number of Visits 36    Date for OT Re-Evaluation 05/13/24    OT Start Time 0314    OT Stop Time 0415    OT Time Calculation (min) 61 min    Activity Tolerance Patient tolerated treatment well;No increased pain    Behavior During Therapy [facility] for tasks assessed/performed             Past Medical History:  Diagnosis Date   Abdominal spasms 12/14/2020   Acute pain of right shoulder 01/31/2023   Acute pulmonary embolism (HCC) 08/21/2019   Bacterial conjunctivitis of both eyes 12/14/2020   Bilateral leg edema 02/10/2020   Bradycardia    Breast cancer screening by mammogram 11/24/2020   Candida infection 10/11/2022   CARCINOMA, THYROID  GLAND, PAPILLARY 05/04/2008   Stage 2, 8/09: thyroidectomy for 2.7cm papillary adenocarcinoma (t2 n0 mo) 9/09: I-131 rx, 108 mci 05/10: tg is neg (ab neg) , total body scan is neg   Chronic right-sided low back pain with right-sided sciatica 11/24/2020   Colon cancer screening 11/24/2020   Colon polyps    Complication of anesthesia    trouble with airway   Cough 12/25/2007   Qualifier: Diagnosis of  By: Germaine LPN, Megan     RNCPI-80 08/19/2019   Decreased ROM of neck 01/31/2023   Diverticulosis    DVT (deep venous thrombosis) (HCC)    Edema 12/22/2008   Encounter to establish care 11/24/2020   Groin pain, chronic, right 11/24/2020   Health care maintenance 11/26/2022   Hip pain 12/22/2020   History of 2019 novel coronavirus disease (COVID-19) 11/09/2019   HYPERTENSION 12/25/2007   HYPOTHYROIDISM, POSTSURGICAL 05/04/2008   LEG PAIN, LEFT 12/09/2008   Low TSH level 12/10/2022   Lumbago with sciatica, right side 01/08/2021   Memory loss due to  medical condition 11/09/2019   Muscle weakness    Nausea and vomiting 05/23/2008   Qualifier: Diagnosis of  By: Kassie MD, Alyce LABOR   Formatting of this note might be different from the original. Qualifier: Diagnosis of  By: Kassie MD, Sean A   Neck pain 01/31/2023   Non-recurrent acute suppurative otitis media of left ear without spontaneous rupture of tympanic membrane 02/17/2023   Numbness and tingling    Obesity, morbid, BMI 50 or higher (HCC) 08/24/2019   OSA (obstructive sleep apnea) 06/15/2013   CPAP   Paresthesia 01/04/2020   Peripheral edema 12/22/2008   Qualifier: Diagnosis of  By: Delford, MD, CODY Maude Dunnings    Pneumonia due to COVID-19 virus    Pulmonary embolism (HCC)    Pulmonary embolism (HCC) 08/23/2019   Right lower quadrant pain 11/24/2020   Sciatica of left side 12/04/2011   Skin sensation disturbance 12/05/2008   Qualifier: Diagnosis of  By: Inocencio MD, Berwyn LABOR Deal of this note might be different from the original. Qualifier: Diagnosis of  By: Inocencio MD, Valerie A   Snoring 12/10/2022   Spasm of muscle of lower back 12/14/2020   Upper back pain 01/31/2023   Vaginal candidiasis 03/16/2021   Weakness 01/04/2020   Past Surgical History:  Procedure Laterality Date   ABDOMINAL HYSTERECTOMY     due to bleeding, ovaries remain  ANKLE SURGERY Right    CERVICAL FUSION     x 2   COLONOSCOPY WITH PROPOFOL  N/A 08/08/2015   Procedure: COLONOSCOPY WITH PROPOFOL ;  Surgeon: Renaye Sous, MD;  Location: [facility];  Service: Endoscopy;  Laterality: N/A;   KNEE SURGERY Left    TOTAL THYROIDECTOMY     Patient Active Problem List   Diagnosis Date Noted   Anemia 03/04/2024   Chronic venous insufficiency 09/10/2023   Weakness of both lower extremities 09/10/2023   Atrophic vaginitis 05/14/2023   Pelvic floor dysfunction 05/14/2023   Pancreatic insufficiency 05/14/2023   Arthritis of knee 03/24/2023   At risk for fall due to comorbid condition  03/10/2023   Mood changes 02/17/2023   Recurrent urinary tract infection 01/31/2023   Walker as ambulation aid 01/31/2023   B12 deficiency 11/26/2022   Other fatigue 11/26/2022   Prediabetes 11/26/2022   Obesity, Class III, BMI 40-49.9 (morbid obesity) 11/26/2022   Dermatofibroma 10/11/2022   Fatty liver 04/20/2021   Incontinence of urine in female 04/20/2021   Vitamin D  deficiency 03/16/2021   Constipation 11/24/2020   Gastroesophageal reflux disease 11/24/2020   OAB (overactive bladder) 11/24/2020   Aortic atherosclerosis (HCC) 11/24/2020   Right-sided heart failure (HCC) 11/24/2020   History of pulmonary embolism 02/10/2020   Lymphedema 02/10/2020   Mobility impaired 01/04/2020   Muscle weakness (generalized) 11/09/2019   Pulmonary nodule 11/09/2019   OSA (obstructive sleep apnea) 06/15/2013   Allergic rhinitis 01/21/2009   History of papillary adenocarcinoma of thyroid  05/04/2008   Hypothyroidism, postsurgical 05/04/2008   Essential hypertension 12/25/2007   SOB (shortness of breath) on exertion 12/25/2007    PCP: Camie Doing, NP  REFERRING PROVIDER: Camie Doing, NP  REFERRING DIAG: I89.0  THERAPY DIAG:  Lymphedema, not elsewhere classified  Rationale for Evaluation and Treatment: Rehabilitation  ONSET DATE: >15 years  SUBJECTIVE:                                                                                                                                                                                          SUBJECTIVE STATEMENT: Ms. Culhane presents to OT in manual transport wc for LE care.Pt is accompanied by her granddaughter and family friend. Pt states PT is going well. Pt tells me she has been able wrap daily with assistance from her daughter. Pt reports she had shots in her knees and they are feeling a little better.  Pt reports 7/10 knee pain without weight bearing. Treated RLE seated in transport wc with leg on footstool to limit transfer time and  pain.  PERTINENT HISTORY:  Hypothyroidism, postsurgical Essential hypertension Dyspnea OSA (obstructive sleep apnea) Obesity,  morbid, BMI 50 or higher (HCC) Muscle weakness (generalized) Gait abnormality History of pulmonary embolism Bilateral lower extremity edema Chronic right-sided low back pain with right-sided sciatica Right-sided heart failure (HCC) Hip pain Incontinence of urine in female Dermatofibroma Morbid obesity (HCC) BMI 50.0-59.9, adult (HCC) SOB (shortness of breath) on exertion Other fatigue Prediabetes Decreased ROM of neck Upper back pain Acute pain of right shoulder Recurrent urinary tract infection Urine frequency Walker as ambulation aid Ambulatory dysfunction Mood changes At risk for fall due to comorbid condition Arthritis of knee Hypothyroidism Chronic venous insufficiency Mobility impaired Weakness of both lower extremities Gait instability Lymphedema BMI 45.0-49.9, adult (HCC)  PAIN:  Are you having pain? Yes, B knees: NPRS scale: 6/10 Pain location: seated and at rest Pain description: creaky crackly, tight, heavy, sore, tender. With and without weight bearing Aggravating factors: standing, walking, weight bearing Relieving factors: walking  PRECAUTIONS: Other: LYMPHEDEMA PRECAUTIONS: prediabetic, hypothyroid,  RED FLAGS: Urinary incontinence; numbness and tingling in fingers, hands and toes   WEIGHT BEARING RESTRICTIONS: No  FALLS:  Has patient fallen in last 6 months? No  LIVING ENVIRONMENT: Lives with: lives with their daughter and  granddaughter Lives in: House/apartment Stairs: Yes; Internal: 14 steps; on right going up and External: garage 4 steps steps; can reach both Has following equipment at home: Walker - 4 wheeled, shower chair, and elevated toilet seat  OCCUPATION: retired Careers information officer professor  LEISURE: concerts, writing and producing plays  HAND DOMINANCE: right   PRIOR LEVEL OF FUNCTION:  Independent  PATIENT GOALS: walk unassisted; lose weight   OBJECTIVE: Note: Objective measures were completed at Evaluation unless otherwise noted.  COGNITION:  Overall cognitive status: Within functional limits for tasks assessed   OBSERVATIONS / OTHER ASSESSMENTS:   POSTURE: head forward, slight shoulder protraction  LE ROM: Limited at hips knees and ankles 2/2 body habitus and skin approximation  LE MMT: generalized weakness  LYMPHEDEMA ASSESSMENTS:   BLE COMPARATIVE LIMB VOLUMETRICS: Initial 02/17/24  LANDMARK RIGHT   R LEG (A-D) 6530.2 ml  R THIGH (E-G) ml  R FULL LIMB (A-G) ml  Limb Volume differential (LVD)  %  Volume change since initial %  Volume change overall V  (Blank rows = not tested)  LANDMARK LEFT  L LEG (A-D) 6601.0  L  THIGH (E-G) ml  L  FULL LIMB (A-G) ml  Limb Volume differential (LVD)  1.08% L>R  Volume change since initial %  Volume change overall %  (Blank rows = not tested)    RLE COMPARATIVE LIMB VOLUMETRICS: 9th visit 03/31/24   LANDMARK RIGHT   R LEG (A-D) 4941.4 ml  R THIGH (E-G) ml  R FULL LIMB (A-G) ml  Limb Volume differential (LVD)  %  Volume change since initial R LEG volume DECREASED by 24.3% since commencing OT for CDT on 02/17/24   Volume change overall V  (Blank rows = not tested)    SKIN/TISSUE INTEGRITY: : Moderate, Stage  II, Bilateral Lower Extremity Lymphedema 2/2 CVI,  Obesity, and Mm weakness  Skin  Description Hyper-Keratosis Peau d' Orange Shiny Tight Fibrotic/ Indurated Fatty Hard Spongy/ boggy   x   x R>L x x x   Skin dry Flaky WNL Macerated   mildly      Color Redness Varicosities Blanching Hemosiderin Stain Mottled        x   Odor Malodorous Yeast Fungal infection  WNL      x   Temperature Warm Cool wnl    x  Pitting Edema   1+ 2+ 3+ 4+ Non-pitting   x         Girth Symmetrical Asymmetrical                   Distribution    R>L toes to groin, bilaterally    Stemmer Sign Positive  Negative   +    Lymphorrhea History Of:  Present Absent     x    Wounds History Of Present Absent Venous Arterial Pressure Sheer   denies  x        Signs of Infection Redness Warmth Erythema Acute Swelling Drainage Borders                    Sensation Light Touch Deep pressure Hypersensitivity   In tact Impaired In tact Impaired Absent Impaired   x  x  x     Nails WNL   Fungus nail dystrophy   TBA     Hair Growth Symmetrical Asymmetrical   TB Ax    Skin Creases Base of toes  Ankles   Base of Fingers knees       Abdominal pannus Thigh Lobules  Face/neck   x x  x       GAIT: Pt transported to clinic in transport wheelchair Distance walked: Pt able to transfer out of wc and walk 4-5 steps using 2 wheeled walker to Rx bed with extra time (modified independent).  Assistive device utilized: Pt able to lift legs onto Rx bed, one at a time, using hands to actively assist Level of assistance: Modified independence Comments: Transfers  and bed mobility take an inordinate amount of time  LYMPHEDEMA LIFE IMPACT SCALE (LLIS): Initial: 86.76% (The extent to which LE-related problems impacted your life over the past week.)                                                                                                                           TREATMENT THIS DATE: RLE MLD Compression wrapping Pt/ CG edu   PATIENT EDUCATION:  Continued Pt/ CG edu for lymphedema self care home program throughout session. Topics include outcome of comparative limb volumetrics- starting limb volume differentials (LVDs), technology and gradient techniques used for short stretch, multilayer compression wrapping, simple self-MLD, therapeutic lymphatic pumping exercises, skin/nail care, LE precautions, compression garment recommendations and specifications, wear and care schedule and compression garment donning / doffing w assistive devices. Discussed progress towards all OT goals since commencing CDT.  Discussed detrimental impact of obesity on lower and upper extremity lymphedema over time. Reviewed OT goals for lymphedema care with Pt and discussed progress to date.  All questions answered to the Pt's satisfaction. Good return. Person educated: Patient and family Education method: Explanation, Demonstration, and Handouts Education comprehension: verbalized understanding, returned demonstration, verbal cues required, and needs further education  LYMPHEDEMA SELF-CARE HOME PROGRAM: BLE lymphatic pumping there ex- 1 set of 10 each element, in order. Hold 5. 2  x daily 2. Daily, short stretch, thigh length, multilayer compression bandages during Intensive Phase CDT 3. During self-Management Phase fit with appropriate compression garments and/ or devices 3. Daily skin care with low ph lotion matching skin ph 4. Daily simple self MLD   ASSESSMENT:  CLINICAL IMPRESSION:Pt continues to have a difficult time regulating lower extremity swelling due to exacerbation by chronic arthritis inflammation in the knees. We're hopeful that recent knee injections will provide some symptom relief. Provided MLD to RLE/RLQ from wheel chair level. Pt tolerated well without increased pain.   CircAid garment order completed for LLE. Anatomical measurements reveal that Pt is unable to fit into an off-the-shelf, or ready made size. So we completed measurements for a custom Circaid, adjustable compression legging today. This custom compression garment alternative is medically necessary to control lymphedema below the knee being easier to don and doff than a standard elastic compression stocking. Pt's AROM limitations limit her ability to don and doff compression knee highs.    Cont as per POC.  (02/12/24 Initial OT Eval : IVYANA LOCEY is a 73 yo female presenting with moderate, stage  II, bilateral lower extremity lymphedema 2/2 CVI,  obesity, and mm weakness. She will benefit from skilled Occupational Therapy to reduce  limb swelling and associated pain, to limit progression and infection risk. BLE lymphedema limits functional performance in all occupational domains, including functional mobility and ambulation,  basic and instrumental ADLs, productive activities, leisure pursuits, social participation and quality of life. This patient will benefit from modified Intensive and Self Management Phase CDT . In addition to MLD, ther ex, skin care and compression bandaging below the knees, Pt will be fitted with custom knee length compression stockings paired with off the shelf , Capri length compression leggings. If insurance benefits allow, she may undergo a trial in the clinic with the advanced Flexitouch device in an effort to reduce discomfort and reduce infection risk. Without skilled OT Li-lymphedema will worsen over time and further functional decline is expected.    Ms Elderkin is unable to reach feet and distal legs to apply multilayer , gradient compression wraps during the Intensive Phase of CDT.  She understands this limitation and agrees to  arrange for a caregiver to assist her daily with compression wrapping between OT visits. With a caregiver to assist her with Intensive Phase compression her prognosis is fair. Without CG assistance her prognosis is poor.)  OBJECTIVE IMPAIRMENTS: decreased activity tolerance, decreased balance, decreased endurance, decreased knowledge of condition, decreased knowledge of use of DME, decreased mobility, difficulty walking, decreased ROM, decreased strength, increased edema, impaired UE functional use, postural dysfunction, obesity, pain, and chronic leg swelling.   ACTIVITY LIMITATIONS: ACTIVITY LIMITATIONS: Mobility and functional ambulation limitations:  abnormal gait pattern, difficulty walking, carrying, lifting, bending, sitting, standing, squatting, stairs, transfers, and bed mobility Basic and instrumental ADLs (reaching feet and distal legs to groom nails, inspect skin,  apply lotion, bathe lower body, difficulty with LB dressing, including fitting LB clothing, shoes and socks, impaired sleeping, meal prep, standing to cook, driving, shopping, yard work, house work Paediatric nurse activities: work related activities requiring extended standing, walking, sitting; caring for others Social participation in the community, socializing with others, LE affects body image  Leisure pursuits requiring extended standing, walking, sitting  PERSONAL FACTORS: Fitness, Time since onset of injury/illness/exacerbation, and 3+ comorbidities: Obesity, arthritis, OSA are also affecting patient's functional outcome.   REHAB POTENTIAL: Fair with daily caregiver assistance with gradient compression wrapping. Without assistance prognosis is  poor as Pt is unable to apply wraps independently  EVALUATION COMPLEXITY: Moderate   GOALS: Goals reviewed with patient? Yes   SHORT TERM GOALS: Target date: 4th OT Rx visit  Pt will demonstrate understanding of lymphedema precautions and prevention strategies with modified independence using a printed reference to identify at least 5 precautions and discussing how s/he may implement them into daily life to reduce risk of progression with modified assistance Baseline: max a Goal status:PROGRESSING   2.  With Max caregiver assistance Pt will be able to apply multilayer, knee length, compression wraps using gradient techniques to decrease limb volume, to limit infection risk, and to limit lymphedema progression.  Given this patient's Intake score of tbd % on the Lymphedema Life Impact Scale (LLIS), patient will experience a reduction of at least 5% in her perceived level of functional impairment resulting from lymphedema to improve functional performance and quality of life (QOL). Baseline: Dependent Goal status: 03/31/24 GOAL MET     LONG TERM GOALS: Target date: 05/13/24 Given this patient's Intake score of 86.76 % on the Lymphedema Life Impact Scale  (LLIS), patient will experience a reduction of at least 10% in her perceived level of functional impairment resulting from lymphedema to improve functional performance and quality of life (QOL).Baseline: tbd Baseline: max a Goal status: PROGRESSING     2.  With modified independence (extra time and assistive devices) Pt will be able to don and doff appropriate compression garments and/or devices to control BLE lymphedema and to limit progression.  Baseline: Dependent Goal status: PROGRESSING   3. Pt will achieve at least a 10% volume reductions bilaterally below the knees to return limb to more typical size and shape, to limit infection risk and LE progression, to decrease pain, to improve function, and to improve body image and QOL. Baseline: Dependent Goal status:PROGRESSING. Volumetrics reveal R LEG volume DECREASED by 24.3% since commencing OT for CDT on 02/17/24. This value meets and exceeds the initial 10% volume reduction goal for the RLE. Goal elevated to 25% bilaterally.    4. Pt will achieve and sustain at least 85% compliance with all LE self-care home program components throughout CDT, including modified simple self-MLD, daily skin care and inspection, lymphatic pumping the ex and appropriate compression to limit lymphedema progression and to limit further functional decline. Baseline: Dependent. (Note: W/out daily assistance with donning/ doffing compression  , prognosis is poor.) Goal status:PROGRESSING. Pt hired paid caregiver who assist her with wrapping. Bandaging very well done.   PLAN:  OT FREQUENCY: 2x/week and PRN  OT DURATION: other: 18 weeks and PRN  PLANNED INTERVENTIONS:  Complete Decongestive Therapy (CDT): Manual Lymphatic Drainage (MLD) , Skin care, ther ex, gradient compression 97110-Therapeutic exercises, 97530- Therapeutic activity, 97535- Self Care, [id number]- Manual therapy, Patient/Family education, Manual lymph drainage, Compression bandaging, DME instructions,  and trial with advanced, sequential, pneumatic, compression device (Tactile Medical Flexitouch) Pt will arrange for a caregiver to assist her daily with compression wrapping between OT visits.   PLAN FOR NEXT SESSION:  Pt/ caregiver edu Knee length, Multilayer compression wraps to R leg only Complete anatomical measurements to determine OTS or ready-made CircAids  Zebedee Dec, MS, OTR/L, CLT-LANA 04/23/24 8:42 AM' OUTPATIENT OCCUPATIONAL THERAPY TREATMENT NOTE AND PROGRESS REPORT  BILATERAL LOWER EXTREMITY/ BLQ LYMPHEDEMA  Patient Name: Natasha [name] MRN: [id number] DOB:1950-10-05, 73 y.o., female Today's Date: 04/23/2024  REPORTING PERIOD: 02/12/24 -  04/07/24  END OF SESSION:  OT End of Session - 04/22/24 1512  Visit Number 12    Number of Visits 36    Date for OT Re-Evaluation 05/13/24    OT Start Time 0314    OT Stop Time 0415    OT Time Calculation (min) 61 min    Activity Tolerance Patient tolerated treatment well;No increased pain    Behavior During Therapy [facility] for tasks assessed/performed             Past Medical History:  Diagnosis Date   Abdominal spasms 12/14/2020   Acute pain of right shoulder 01/31/2023   Acute pulmonary embolism (HCC) 08/21/2019   Bacterial conjunctivitis of both eyes 12/14/2020   Bilateral leg edema 02/10/2020   Bradycardia    Breast cancer screening by mammogram 11/24/2020   Candida infection 10/11/2022   CARCINOMA, THYROID  GLAND, PAPILLARY 05/04/2008   Stage 2, 8/09: thyroidectomy for 2.7cm papillary adenocarcinoma (t2 n0 mo) 9/09: I-131 rx, 108 mci 05/10: tg is neg (ab neg) , total body scan is neg   Chronic right-sided low back pain with right-sided sciatica 11/24/2020   Colon cancer screening 11/24/2020   Colon polyps    Complication of anesthesia    trouble with airway   Cough 12/25/2007   Qualifier: Diagnosis of  By: Germaine LPN, Megan     RNCPI-80 08/19/2019   Decreased ROM of neck 01/31/2023   Diverticulosis     DVT (deep venous thrombosis) (HCC)    Edema 12/22/2008   Encounter to establish care 11/24/2020   Groin pain, chronic, right 11/24/2020   Health care maintenance 11/26/2022   Hip pain 12/22/2020   History of 2019 novel coronavirus disease (COVID-19) 11/09/2019   HYPERTENSION 12/25/2007   HYPOTHYROIDISM, POSTSURGICAL 05/04/2008   LEG PAIN, LEFT 12/09/2008   Low TSH level 12/10/2022   Lumbago with sciatica, right side 01/08/2021   Memory loss due to medical condition 11/09/2019   Muscle weakness    Nausea and vomiting 05/23/2008   Qualifier: Diagnosis of  By: Kassie MD, Alyce LABOR   Formatting of this note might be different from the original. Qualifier: Diagnosis of  By: Kassie MD, Sean A   Neck pain 01/31/2023   Non-recurrent acute suppurative otitis media of left ear without spontaneous rupture of tympanic membrane 02/17/2023   Numbness and tingling    Obesity, morbid, BMI 50 or higher (HCC) 08/24/2019   OSA (obstructive sleep apnea) 06/15/2013   CPAP   Paresthesia 01/04/2020   Peripheral edema 12/22/2008   Qualifier: Diagnosis of  By: Delford, MD, CODY Maude Dunnings    Pneumonia due to COVID-19 virus    Pulmonary embolism (HCC)    Pulmonary embolism (HCC) 08/23/2019   Right lower quadrant pain 11/24/2020   Sciatica of left side 12/04/2011   Skin sensation disturbance 12/05/2008   Qualifier: Diagnosis of  By: Inocencio MD, Berwyn LABOR Deal of this note might be different from the original. Qualifier: Diagnosis of  By: Inocencio MD, Valerie A   Snoring 12/10/2022   Spasm of muscle of lower back 12/14/2020   Upper back pain 01/31/2023   Vaginal candidiasis 03/16/2021   Weakness 01/04/2020   Past Surgical History:  Procedure Laterality Date   ABDOMINAL HYSTERECTOMY     due to bleeding, ovaries remain   ANKLE SURGERY Right    CERVICAL FUSION     x 2   COLONOSCOPY WITH PROPOFOL  N/A 08/08/2015   Procedure: COLONOSCOPY WITH PROPOFOL ;  Surgeon: Renaye Sous, MD;  Location: [facility]  ENDOSCOPY;  Service: Endoscopy;  Laterality: N/A;  KNEE SURGERY Left    TOTAL THYROIDECTOMY     Patient Active Problem List   Diagnosis Date Noted   Anemia 03/04/2024   Chronic venous insufficiency 09/10/2023   Weakness of both lower extremities 09/10/2023   Atrophic vaginitis 05/14/2023   Pelvic floor dysfunction 05/14/2023   Pancreatic insufficiency 05/14/2023   Arthritis of knee 03/24/2023   At risk for fall due to comorbid condition 03/10/2023   Mood changes 02/17/2023   Recurrent urinary tract infection 01/31/2023   Walker as ambulation aid 01/31/2023   B12 deficiency 11/26/2022   Other fatigue 11/26/2022   Prediabetes 11/26/2022   Obesity, Class III, BMI 40-49.9 (morbid obesity) 11/26/2022   Dermatofibroma 10/11/2022   Fatty liver 04/20/2021   Incontinence of urine in female 04/20/2021   Vitamin D  deficiency 03/16/2021   Constipation 11/24/2020   Gastroesophageal reflux disease 11/24/2020   OAB (overactive bladder) 11/24/2020   Aortic atherosclerosis (HCC) 11/24/2020   Right-sided heart failure (HCC) 11/24/2020   History of pulmonary embolism 02/10/2020   Lymphedema 02/10/2020   Mobility impaired 01/04/2020   Muscle weakness (generalized) 11/09/2019   Pulmonary nodule 11/09/2019   OSA (obstructive sleep apnea) 06/15/2013   Allergic rhinitis 01/21/2009   History of papillary adenocarcinoma of thyroid  05/04/2008   Hypothyroidism, postsurgical 05/04/2008   Essential hypertension 12/25/2007   SOB (shortness of breath) on exertion 12/25/2007    PCP: Camie Doing, NP  REFERRING PROVIDER: Camie Doing, NP  REFERRING DIAG: I89.0  THERAPY DIAG:  Lymphedema, not elsewhere classified  Rationale for Evaluation and Treatment: Rehabilitation  ONSET DATE: >15 years  SUBJECTIVE:                                                                                                                                                                                          SUBJECTIVE  STATEMENT: Ms. Guinta presents to OT in manual transport wc for LE care.Pt is accompanied by daughter. Pt is 15 minutes late for a 60 minutes session. Pt has a PT session after OT today.  Pt tells me she has been able wrap daily with assistance from her daughter. Pt states she has been using the compression pump on the LLE, the yet to be treated leg. Pt reports 6/10 knee pain with transfers and increased swelling in the L Leg today.  PERTINENT HISTORY:  Hypothyroidism, postsurgical Essential hypertension Dyspnea OSA (obstructive sleep apnea) Obesity, morbid, BMI 50 or higher (HCC) Muscle weakness (generalized) Gait abnormality History of pulmonary embolism Bilateral lower extremity edema Chronic right-sided low back pain with right-sided sciatica Right-sided heart failure (HCC) Hip pain Incontinence of urine in female Dermatofibroma Morbid obesity (HCC) BMI  50.0-59.9, adult (HCC) SOB (shortness of breath) on exertion Other fatigue Prediabetes Decreased ROM of neck Upper back pain Acute pain of right shoulder Recurrent urinary tract infection Urine frequency Walker as ambulation aid Ambulatory dysfunction Mood changes At risk for fall due to comorbid condition Arthritis of knee Hypothyroidism Chronic venous insufficiency Mobility impaired Weakness of both lower extremities Gait instability Lymphedema BMI 45.0-49.9, adult (HCC)  PAIN:  Are you having pain? Yes, B knees: NPRS scale: 6/10 Pain location:  Pain description: creaky crackly, tight, heavy, sore, tender. With and without weight bearing Aggravating factors: standing, walking, weight bearing Relieving factors: walking  PRECAUTIONS: Other: LYMPHEDEMA PRECAUTIONS: prediabetic, hypothyroid,  RED FLAGS: Urinary incontinence; numbness and tingling in fingers, hands and toes   WEIGHT BEARING RESTRICTIONS: No  FALLS:  Has patient fallen in last 6 months? No  LIVING ENVIRONMENT: Lives with: lives with their  daughter and  granddaughter Lives in: House/apartment Stairs: Yes; Internal: 14 steps; on right going up and External: garage 4 steps steps; can reach both Has following equipment at home: Walker - 4 wheeled, shower chair, and elevated toilet seat  OCCUPATION: retired Careers information officer professor  LEISURE: concerts, writing and producing plays  HAND DOMINANCE: right   PRIOR LEVEL OF FUNCTION: Independent  PATIENT GOALS: walk unassisted; lose weight   OBJECTIVE: Note: Objective measures were completed at Evaluation unless otherwise noted.  COGNITION:  Overall cognitive status: Within functional limits for tasks assessed   OBSERVATIONS / OTHER ASSESSMENTS:   POSTURE: head forward, slight shoulder protraction  LE ROM: Limited at hips knees and ankles 2/2 body habitus and skin approximation  LE MMT: generalized weakness  LYMPHEDEMA ASSESSMENTS:   BLE COMPARATIVE LIMB VOLUMETRICS: Initial 02/17/24  LANDMARK RIGHT   R LEG (A-D) 6530.2 ml  R THIGH (E-G) ml  R FULL LIMB (A-G) ml  Limb Volume differential (LVD)  %  Volume change since initial %  Volume change overall V  (Blank rows = not tested)  LANDMARK LEFT  L LEG (A-D) 6601.0  L  THIGH (E-G) ml  L  FULL LIMB (A-G) ml  Limb Volume differential (LVD)  1.08% L>R  Volume change since initial %  Volume change overall %  (Blank rows = not tested)    RLE COMPARATIVE LIMB VOLUMETRICS: 9th visit 03/31/24   LANDMARK RIGHT   R LEG (A-D) 4941.4 ml  R THIGH (E-G) ml  R FULL LIMB (A-G) ml  Limb Volume differential (LVD)  %  Volume change since initial R LEG volume DECREASED by 24.3% since commencing OT for CDT on 02/17/24   Volume change overall V  (Blank rows = not tested)    SKIN/TISSUE INTEGRITY: : Moderate, Stage  II, Bilateral Lower Extremity Lymphedema 2/2 CVI,  Obesity, and Mm weakness  Skin  Description Hyper-Keratosis Peau d' Orange Shiny Tight Fibrotic/ Indurated Fatty Hard Spongy/ boggy   x   x R>L x x x   Skin dry  Flaky WNL Macerated   mildly      Color Redness Varicosities Blanching Hemosiderin Stain Mottled        x   Odor Malodorous Yeast Fungal infection  WNL      x   Temperature Warm Cool wnl    x     Pitting Edema   1+ 2+ 3+ 4+ Non-pitting   x         Girth Symmetrical Asymmetrical                   Distribution  R>L toes to groin, bilaterally    Stemmer Sign Positive Negative   +    Lymphorrhea History Of:  Present Absent     x    Wounds History Of Present Absent Venous Arterial Pressure Sheer   denies  x        Signs of Infection Redness Warmth Erythema Acute Swelling Drainage Borders                    Sensation Light Touch Deep pressure Hypersensitivity   In tact Impaired In tact Impaired Absent Impaired   x  x  x     Nails WNL   Fungus nail dystrophy   TBA     Hair Growth Symmetrical Asymmetrical   TB Ax    Skin Creases Base of toes  Ankles   Base of Fingers knees       Abdominal pannus Thigh Lobules  Face/neck   x x  x       GAIT: Pt transported to clinic in transport wheelchair Distance walked: Pt able to transfer out of wc and walk 4-5 steps using 2 wheeled walker to Rx bed with extra time (modified independent).  Assistive device utilized: Pt able to lift legs onto Rx bed, one at a time, using hands to actively assist Level of assistance: Modified independence Comments: Transfers  and bed mobility take an inordinate amount of time  LYMPHEDEMA LIFE IMPACT SCALE (LLIS): Initial: 86.76% (The extent to which LE-related problems impacted your life over the past week.)                                                                                                                           TREATMENT THIS DATE: RLE MLD Compression wrapping Pt/ CG edu   PATIENT EDUCATION:  Continued Pt/ CG edu for lymphedema self care home program throughout session. Topics include outcome of comparative limb volumetrics- starting limb volume differentials  (LVDs), technology and gradient techniques used for short stretch, multilayer compression wrapping, simple self-MLD, therapeutic lymphatic pumping exercises, skin/nail care, LE precautions, compression garment recommendations and specifications, wear and care schedule and compression garment donning / doffing w assistive devices. Discussed progress towards all OT goals since commencing CDT. Discussed detrimental impact of obesity on lower and upper extremity lymphedema over time. Reviewed OT goals for lymphedema care with Pt and discussed progress to date.  All questions answered to the Pt's satisfaction. Good return. Person educated: Patient and family Education method: Explanation, Demonstration, and Handouts Education comprehension: verbalized understanding, returned demonstration, verbal cues required, and needs further education  LYMPHEDEMA SELF-CARE HOME PROGRAM: BLE lymphatic pumping there ex- 1 set of 10 each element, in order. Hold 5. 2 x daily 2. Daily, short stretch, thigh length, multilayer compression bandages during Intensive Phase CDT 3. During self-Management Phase fit with appropriate compression garments and/ or devices 3. Daily skin care with low ph lotion matching skin ph 4. Daily simple self MLD   ASSESSMENT:  CLINICAL IMPRESSION:Pt continues to make slow but steady progress towards OT goals for lymphedema care. Please refer to GOALS section below for detailed report on each goal. To date R LEG volume is DECREASED by 24.3% since commencing OT for CDT on 02/17/24. This value meets and exceeds the initial 10% volume reduction goal for the RLE. Goal elevated to 25% bilaterally. Applied R LE gradient compression wraps. We were unable to finalize measurements and specifications for CircAids today due to time constraints, but we were able to determine that Pt does fit into a ready to wear size. We'll submit measurements to DME vendor after next session when we complete R leg  measurements. We'll shift Rx to LLE after RLE garment is fitted. LLE remains very swollen, dense and hard to palpation.   Pt is ready to be fit with custom RLE , knee length, adjustable, Velcro wrap-style compression legging, or CircAid Juxtafit Essential. The LLE treatment and fitting will commence after RLE garment fitting is complete. The CircAid legging is needed to enable patient to don and doff the legging with the highest level of independence possible. It is also caregiver friendly. This garment is preferred because it is the only manufactured adjustable garment with a built in gauging system that enables Pt's to see when they have applied correct amount of containment and compression to control their lymphedema.   Fit with: 4 total quantity- (2 RLE and @ LLE) Velcro wrap-style compression legging, CircAid Juxtafit Essential lower leg  ( one to wash and one to wear) 2 quantity (1 R and 1 Left)  Mediven CircAid comfort accessory kit, LARGE, containing compression anklets, leg liners (undersocks) , 1 cover up and Pac bands to control foot swelling  Custom-made gradient compression garments  are medically necessary because they are uniquely sized and shaped to fit the exact dimensions of the affected extremities, and to provide appropriate medical grade, graduated compression essential for optimally managing chronic, progressive lymphedema. Multiple custom compression garments are needed to ensure proper hygiene to limit infection risk. Custom compression garments should be replaced q 3-6 months when worn consistently for optimal lymphedema self-management over time they will wear out and require replacement. HOS devices, medically necessary to limit fibrosis buildup in tissue, should be replaced q 2 years and PRN when worn out.   Cont as per POC.  (02/12/24 Initial OT Eval : Natasha [name] is a 73 yo female presenting with moderate, stage  II, bilateral lower extremity lymphedema 2/2 CVI,  obesity,  and mm weakness. She will benefit from skilled Occupational Therapy to reduce limb swelling and associated pain, to limit progression and infection risk. BLE lymphedema limits functional performance in all occupational domains, including functional mobility and ambulation,  basic and instrumental ADLs, productive activities, leisure pursuits, social participation and quality of life. This patient will benefit from modified Intensive and Self Management Phase CDT . In addition to MLD, ther ex, skin care and compression bandaging below the knees, Pt will be fitted with custom knee length compression stockings paired with off the shelf , Capri length compression leggings. If insurance benefits allow, she may undergo a trial in the clinic with the advanced Flexitouch device in an effort to reduce discomfort and reduce infection risk. Without skilled OT Li-lymphedema will worsen over time and further functional decline is expected.    Ms Rottinghaus is unable to reach feet and distal legs to apply multilayer , gradient compression wraps during the Intensive Phase of CDT.  She understands this limitation  and agrees to  arrange for a caregiver to assist her daily with compression wrapping between OT visits. With a caregiver to assist her with Intensive Phase compression her prognosis is fair. Without CG assistance her prognosis is poor.)  OBJECTIVE IMPAIRMENTS: decreased activity tolerance, decreased balance, decreased endurance, decreased knowledge of condition, decreased knowledge of use of DME, decreased mobility, difficulty walking, decreased ROM, decreased strength, increased edema, impaired UE functional use, postural dysfunction, obesity, pain, and chronic leg swelling.   ACTIVITY LIMITATIONS: ACTIVITY LIMITATIONS: Mobility and functional ambulation limitations:  abnormal gait pattern, difficulty walking, carrying, lifting, bending, sitting, standing, squatting, stairs, transfers, and bed mobility Basic and  instrumental ADLs (reaching feet and distal legs to groom nails, inspect skin, apply lotion, bathe lower body, difficulty with LB dressing, including fitting LB clothing, shoes and socks, impaired sleeping, meal prep, standing to cook, driving, shopping, yard work, house work Paediatric nurse activities: work related activities requiring extended standing, walking, sitting; caring for others Social participation in the community, socializing with others, LE affects body image  Leisure pursuits requiring extended standing, walking, sitting  PERSONAL FACTORS: Fitness, Time since onset of injury/illness/exacerbation, and 3+ comorbidities: Obesity, arthritis, OSA are also affecting patient's functional outcome.   REHAB POTENTIAL: Fair with daily caregiver assistance with gradient compression wrapping. Without assistance prognosis is poor as Pt is unable to apply wraps independently  EVALUATION COMPLEXITY: Moderate   GOALS: Goals reviewed with patient? Yes   SHORT TERM GOALS: Target date: 4th OT Rx visit  Pt will demonstrate understanding of lymphedema precautions and prevention strategies with modified independence using a printed reference to identify at least 5 precautions and discussing how s/he may implement them into daily life to reduce risk of progression with modified assistance Baseline: max a Goal status:PROGRESSING   2.  With Max caregiver assistance Pt will be able to apply multilayer, knee length, compression wraps using gradient techniques to decrease limb volume, to limit infection risk, and to limit lymphedema progression.  Given this patient's Intake score of tbd % on the Lymphedema Life Impact Scale (LLIS), patient will experience a reduction of at least 5% in her perceived level of functional impairment resulting from lymphedema to improve functional performance and quality of life (QOL). Baseline: Dependent Goal status: 03/31/24 GOAL MET     LONG TERM GOALS: Target date:  05/13/24 Given this patient's Intake score of 86.76 % on the Lymphedema Life Impact Scale (LLIS), patient will experience a reduction of at least 10% in her perceived level of functional impairment resulting from lymphedema to improve functional performance and quality of life (QOL).Baseline: tbd Baseline: max a Goal status: PROGRESSING     2.  With modified independence (extra time and assistive devices) Pt will be able to don and doff appropriate compression garments and/or devices to control BLE lymphedema and to limit progression.  Baseline: Dependent Goal status: PROGRESSING   3. Pt will achieve at least a 10% volume reductions bilaterally below the knees to return limb to more typical size and shape, to limit infection risk and LE progression, to decrease pain, to improve function, and to improve body image and QOL. Baseline: Dependent Goal status:PROGRESSING. Volumetrics reveal R LEG volume DECREASED by 24.3% since commencing OT for CDT on 02/17/24. This value meets and exceeds the initial 10% volume reduction goal for the RLE. Goal elevated to 25% bilaterally.    4. Pt will achieve and sustain at least 85% compliance with all LE self-care home program components throughout CDT, including modified simple self-MLD,  daily skin care and inspection, lymphatic pumping the ex and appropriate compression to limit lymphedema progression and to limit further functional decline. Baseline: Dependent. (Note: W/out daily assistance with donning/ doffing compression  , prognosis is poor.) Goal status:PROGRESSING. Pt hired paid caregiver who assist her with wrapping. Bandaging very well done.   PLAN:  OT FREQUENCY: 2x/week and PRN  OT DURATION: other: 18 weeks and PRN  PLANNED INTERVENTIONS:  Complete Decongestive Therapy (CDT): Manual Lymphatic Drainage (MLD) , Skin care, ther ex, gradient compression 97110-Therapeutic exercises, 97530- Therapeutic activity, 97535- Self Care, [id number]- Manual therapy,  Patient/Family education, Manual lymph drainage, Compression bandaging, DME instructions, and trial with advanced, sequential, pneumatic, compression device (Tactile Medical Flexitouch) Pt will arrange for a caregiver to assist her daily with compression wrapping between OT visits.   PLAN FOR NEXT SESSION:  Pt/ caregiver edu Knee length, Multilayer compression wraps to R leg only Complete anatomical measurements to determine OTS or ready-made CircAids  Zebedee Dec, MS, OTR/L, CLT-LANA 04/23/24 8:42 AM'

## 2024-04-24 ENCOUNTER — Other Ambulatory Visit: Payer: Self-pay | Admitting: Nurse Practitioner

## 2024-04-24 DIAGNOSIS — I10 Essential (primary) hypertension: Secondary | ICD-10-CM

## 2024-04-27 DIAGNOSIS — R262 Difficulty in walking, not elsewhere classified: Secondary | ICD-10-CM | POA: Diagnosis not present

## 2024-04-27 DIAGNOSIS — M170 Bilateral primary osteoarthritis of knee: Secondary | ICD-10-CM | POA: Diagnosis not present

## 2024-04-27 DIAGNOSIS — M25562 Pain in left knee: Secondary | ICD-10-CM | POA: Diagnosis not present

## 2024-04-27 DIAGNOSIS — M25561 Pain in right knee: Secondary | ICD-10-CM | POA: Diagnosis not present

## 2024-04-28 ENCOUNTER — Ambulatory Visit: Admitting: Physical Therapy

## 2024-04-28 ENCOUNTER — Encounter: Payer: Self-pay | Admitting: Occupational Therapy

## 2024-04-28 ENCOUNTER — Ambulatory Visit: Admitting: Occupational Therapy

## 2024-04-28 DIAGNOSIS — I890 Lymphedema, not elsewhere classified: Secondary | ICD-10-CM

## 2024-04-28 DIAGNOSIS — M25561 Pain in right knee: Secondary | ICD-10-CM | POA: Diagnosis not present

## 2024-04-28 DIAGNOSIS — R2689 Other abnormalities of gait and mobility: Secondary | ICD-10-CM | POA: Diagnosis not present

## 2024-04-28 DIAGNOSIS — M6281 Muscle weakness (generalized): Secondary | ICD-10-CM | POA: Diagnosis not present

## 2024-04-28 DIAGNOSIS — G8929 Other chronic pain: Secondary | ICD-10-CM | POA: Diagnosis not present

## 2024-04-28 DIAGNOSIS — R260 Ataxic gait: Secondary | ICD-10-CM | POA: Diagnosis not present

## 2024-04-28 DIAGNOSIS — M25562 Pain in left knee: Secondary | ICD-10-CM | POA: Diagnosis not present

## 2024-04-28 DIAGNOSIS — R262 Difficulty in walking, not elsewhere classified: Secondary | ICD-10-CM | POA: Diagnosis not present

## 2024-04-29 DIAGNOSIS — F411 Generalized anxiety disorder: Secondary | ICD-10-CM | POA: Diagnosis not present

## 2024-04-29 NOTE — Therapy (Signed)
 OUTPATIENT OCCUPATIONAL THERAPY TREATMENT NOTE   BILATERAL LOWER EXTREMITY/ BLQ LYMPHEDEMA  Patient Name: Natasha [name] MRN: [id number] DOB:[birth year], 73 y.o., female Today's Date: 04/29/2024  REPORTING PERIOD:  END OF SESSION:  OT End of Session - 04/28/24 1512     Visit Number 13    Number of Visits 36    Date for OT Re-Evaluation 05/13/24    OT Start Time 0312    OT Stop Time 0401    OT Time Calculation (min) 49 min    Activity Tolerance Patient tolerated treatment well;No increased pain    Behavior During Therapy [facility] for tasks assessed/performed             Past Medical History:  Diagnosis Date   Abdominal spasms 12/14/2020   Acute pain of right shoulder 01/31/2023   Acute pulmonary embolism (HCC) 08/21/2019   Bacterial conjunctivitis of both eyes 12/14/2020   Bilateral leg edema 02/10/2020   Bradycardia    Breast cancer screening by mammogram 11/24/2020   Candida infection 10/11/2022   CARCINOMA, THYROID  GLAND, PAPILLARY 05/04/2008   Stage 2, 8/09: thyroidectomy for 2.7cm papillary adenocarcinoma (t2 n0 mo) 9/09: I-131 rx, 108 mci 05/10: tg is neg (ab neg) , total body scan is neg   Chronic right-sided low back pain with right-sided sciatica 11/24/2020   Colon cancer screening 11/24/2020   Colon polyps    Complication of anesthesia    trouble with airway   Cough 12/25/2007   Qualifier: Diagnosis of  By: Germaine LPN, Megan     RNCPI-80 08/19/2019   Decreased ROM of neck 01/31/2023   Diverticulosis    DVT (deep venous thrombosis) (HCC)    Edema 12/22/2008   Encounter to establish care 11/24/2020   Groin pain, chronic, right 11/24/2020   Health care maintenance 11/26/2022   Hip pain 12/22/2020   History of 2019 novel coronavirus disease (COVID-19) 11/09/2019   HYPERTENSION 12/25/2007   HYPOTHYROIDISM, POSTSURGICAL 05/04/2008   LEG PAIN, LEFT 12/09/2008   Low TSH level 12/10/2022   Lumbago with sciatica, right side 01/08/2021   Memory loss due to  medical condition 11/09/2019   Muscle weakness    Nausea and vomiting 05/23/2008   Qualifier: Diagnosis of  By: Kassie MD, Alyce LABOR   Formatting of this note might be different from the original. Qualifier: Diagnosis of  By: Kassie MD, Sean A   Neck pain 01/31/2023   Non-recurrent acute suppurative otitis media of left ear without spontaneous rupture of tympanic membrane 02/17/2023   Numbness and tingling    Obesity, morbid, BMI 50 or higher (HCC) 08/24/2019   OSA (obstructive sleep apnea) 06/15/2013   CPAP   Paresthesia 01/04/2020   Peripheral edema 12/22/2008   Qualifier: Diagnosis of  By: Delford, MD, CODY Maude Dunnings    Pneumonia due to COVID-19 virus    Pulmonary embolism (HCC)    Pulmonary embolism (HCC) 08/23/2019   Right lower quadrant pain 11/24/2020   Sciatica of left side 12/04/2011   Skin sensation disturbance 12/05/2008   Qualifier: Diagnosis of  By: Inocencio MD, Berwyn LABOR Deal of this note might be different from the original. Qualifier: Diagnosis of  By: Inocencio MD, Valerie A   Snoring 12/10/2022   Spasm of muscle of lower back 12/14/2020   Upper back pain 01/31/2023   Vaginal candidiasis 03/16/2021   Weakness 01/04/2020   Past Surgical History:  Procedure Laterality Date   ABDOMINAL HYSTERECTOMY     due to bleeding, ovaries remain  ANKLE SURGERY Right    CERVICAL FUSION     x 2   COLONOSCOPY WITH PROPOFOL  N/A 08/08/2015   Procedure: COLONOSCOPY WITH PROPOFOL ;  Surgeon: Renaye Sous, MD;  Location: [facility];  Service: Endoscopy;  Laterality: N/A;   KNEE SURGERY Left    TOTAL THYROIDECTOMY     Patient Active Problem List   Diagnosis Date Noted   Anemia 03/04/2024   Chronic venous insufficiency 09/10/2023   Weakness of both lower extremities 09/10/2023   Atrophic vaginitis 05/14/2023   Pelvic floor dysfunction 05/14/2023   Pancreatic insufficiency 05/14/2023   Arthritis of knee 03/24/2023   At risk for fall due to comorbid condition  03/10/2023   Mood changes 02/17/2023   Recurrent urinary tract infection 01/31/2023   Walker as ambulation aid 01/31/2023   B12 deficiency 11/26/2022   Other fatigue 11/26/2022   Prediabetes 11/26/2022   Obesity, Class III, BMI 40-49.9 (morbid obesity) 11/26/2022   Dermatofibroma 10/11/2022   Fatty liver 04/20/2021   Incontinence of urine in female 04/20/2021   Vitamin D  deficiency 03/16/2021   Constipation 11/24/2020   Gastroesophageal reflux disease 11/24/2020   OAB (overactive bladder) 11/24/2020   Aortic atherosclerosis (HCC) 11/24/2020   Right-sided heart failure (HCC) 11/24/2020   History of pulmonary embolism 02/10/2020   Lymphedema 02/10/2020   Mobility impaired 01/04/2020   Muscle weakness (generalized) 11/09/2019   Pulmonary nodule 11/09/2019   OSA (obstructive sleep apnea) 06/15/2013   Allergic rhinitis 01/21/2009   History of papillary adenocarcinoma of thyroid  05/04/2008   Hypothyroidism, postsurgical 05/04/2008   Essential hypertension 12/25/2007   SOB (shortness of breath) on exertion 12/25/2007    PCP: Natasha Doing, NP  REFERRING PROVIDER: Camie Doing, NP  REFERRING DIAG: I89.0  THERAPY DIAG:  Lymphedema, not elsewhere classified  Rationale for Evaluation and Treatment: Rehabilitation  ONSET DATE: >15 years  SUBJECTIVE:                                                                                                                                                                                          SUBJECTIVE STATEMENT: [name] presents to OT in manual transport wc for LE care.Pt is unaccompanied. Pt states she has had first of 4 shots in her knees during OT visit interval to reduce arthritis pain and inflammation, but so far has not had any relief.  Pt reports 7/10 knee pain without weight bearing. Treated RLE seated in transport wc with leg on footstool to limit transfer time and pain. RLE , knee length, CircAid compression leggings were ordered  after last session.  PERTINENT HISTORY:  Hypothyroidism, postsurgical Essential hypertension Dyspnea OSA (obstructive sleep apnea)  Obesity, morbid, BMI 50 or higher (HCC) Muscle weakness (generalized) Gait abnormality History of pulmonary embolism Bilateral lower extremity edema Chronic right-sided low back pain with right-sided sciatica Right-sided heart failure (HCC) Hip pain Incontinence of urine in female Dermatofibroma Morbid obesity (HCC) BMI 50.0-59.9, adult (HCC) SOB (shortness of breath) on exertion Other fatigue Prediabetes Decreased ROM of neck Upper back pain Acute pain of right shoulder Recurrent urinary tract infection Urine frequency Walker as ambulation aid Ambulatory dysfunction Mood changes At risk for fall due to comorbid condition Arthritis of knee Hypothyroidism Chronic venous insufficiency Mobility impaired Weakness of both lower extremities Gait instability Lymphedema BMI 45.0-49.9, adult (HCC)  PAIN:  Are you having pain? Yes, B knees: NPRS scale: 6/10 Pain location: seated and at rest Pain description: creaky crackly, tight, heavy, sore, tender. With and without weight bearing Aggravating factors: standing, walking, weight bearing Relieving factors: walking  PRECAUTIONS: Other: LYMPHEDEMA PRECAUTIONS: prediabetic, hypothyroid, Fall Risk  RED FLAGS: Urinary incontinence; numbness and tingling in fingers, hands and toes   WEIGHT BEARING RESTRICTIONS: No  FALLS:  Has patient fallen in last 6 months? No  LIVING ENVIRONMENT: Lives with: lives with their daughter and  granddaughter Lives in: House/apartment Stairs: Yes; Internal: 14 steps; on right going up and External: garage 4 steps steps; can reach both Has following equipment at home: Walker - 4 wheeled, shower chair, and elevated toilet seat  OCCUPATION: retired Careers information officer professor  LEISURE: concerts, writing and producing plays  HAND DOMINANCE: right   PRIOR LEVEL OF  FUNCTION: Independent  PATIENT GOALS: walk unassisted; lose weight   OBJECTIVE: Note: Objective measures were completed at Evaluation unless otherwise noted.  COGNITION:  Overall cognitive status: Within functional limits for tasks assessed   OBSERVATIONS / OTHER ASSESSMENTS:   POSTURE: head forward, slight shoulder protraction  LE ROM: Limited at hips knees and ankles 2/2 body habitus and skin approximation  LE MMT: generalized weakness  LYMPHEDEMA ASSESSMENTS:   BLE COMPARATIVE LIMB VOLUMETRICS: Initial 02/17/24  LANDMARK RIGHT   R LEG (A-D) 6530.2 ml  R THIGH (E-G) ml  R FULL LIMB (A-G) ml  Limb Volume differential (LVD)  %  Volume change since initial %  Volume change overall V  (Blank rows = not tested)  LANDMARK LEFT  L LEG (A-D) 6601.0  L  THIGH (E-G) ml  L  FULL LIMB (A-G) ml  Limb Volume differential (LVD)  1.08% L>R  Volume change since initial %  Volume change overall %  (Blank rows = not tested)    RLE COMPARATIVE LIMB VOLUMETRICS: 9th visit 03/31/24   LANDMARK RIGHT   R LEG (A-D) 4941.4 ml  R THIGH (E-G) ml  R FULL LIMB (A-G) ml  Limb Volume differential (LVD)  %  Volume change since initial R LEG volume DECREASED by 24.3% since commencing OT for CDT on 02/17/24   Volume change overall V  (Blank rows = not tested)    SKIN/TISSUE INTEGRITY: : Moderate, Stage  II, Bilateral Lower Extremity Lymphedema 2/2 CVI,  Obesity, and Mm weakness  Skin  Description Hyper-Keratosis Peau d' Orange Shiny Tight Fibrotic/ Indurated Fatty Hard Spongy/ boggy   x   x R>L x x x   Skin dry Flaky WNL Macerated   mildly      Color Redness Varicosities Blanching Hemosiderin Stain Mottled        x   Odor Malodorous Yeast Fungal infection  WNL      x   Temperature Warm Cool wnl  x     Pitting Edema   1+ 2+ 3+ 4+ Non-pitting   x         Girth Symmetrical Asymmetrical                   Distribution    R>L toes to groin, bilaterally    Stemmer Sign  Positive Negative   +    Lymphorrhea History Of:  Present Absent     x    Wounds History Of Present Absent Venous Arterial Pressure Sheer   denies  x        Signs of Infection Redness Warmth Erythema Acute Swelling Drainage Borders                    Sensation Light Touch Deep pressure Hypersensitivity   In tact Impaired In tact Impaired Absent Impaired   x  x  x     Nails WNL   Fungus nail dystrophy   TBA     Hair Growth Symmetrical Asymmetrical   TB Ax    Skin Creases Base of toes  Ankles   Base of Fingers knees       Abdominal pannus Thigh Lobules  Face/neck   x x  x       GAIT: Pt transported to clinic in transport wheelchair Distance walked: Pt able to transfer out of wc and walk 4-5 steps using 2 wheeled walker to Rx bed with extra time (modified independent).  Assistive device utilized: Pt able to lift legs onto Rx bed, one at a time, using hands to actively assist Level of assistance: Modified independence Comments: Transfers  and bed mobility take an inordinate amount of time  LYMPHEDEMA LIFE IMPACT SCALE (LLIS): Initial: 86.76% (The extent to which LE-related problems impacted your life over the past week.)                                                                                                                           TREATMENT THIS DATE: RLE MLD Compression wrapping Pt/ CG edu   PATIENT EDUCATION:  Continued Pt/ CG edu for lymphedema self care home program throughout session. Topics include outcome of comparative limb volumetrics- starting limb volume differentials (LVDs), technology and gradient techniques used for short stretch, multilayer compression wrapping, simple self-MLD, therapeutic lymphatic pumping exercises, skin/nail care, LE precautions, compression garment recommendations and specifications, wear and care schedule and compression garment donning / doffing w assistive devices. Discussed progress towards all OT goals since  commencing CDT. Discussed detrimental impact of obesity on lower and upper extremity lymphedema over time. Reviewed OT goals for lymphedema care with Pt and discussed progress to date.  All questions answered to the Pt's satisfaction. Good return. Person educated: Patient and family Education method: Explanation, Demonstration, and Handouts Education comprehension: verbalized understanding, returned demonstration, verbal cues required, and needs further education  LYMPHEDEMA SELF-CARE HOME PROGRAM: BLE lymphatic pumping there ex- 1 set of 10 each element,  in order. Hold 5. 2 x daily 2. Daily, short stretch, thigh length, multilayer compression bandages during Intensive Phase CDT 3. During self-Management Phase fit with appropriate compression garments and/ or devices 3. Daily skin care with low ph lotion matching skin ph 4. Daily simple self MLD   ASSESSMENT:  CLINICAL IMPRESSION:Pt continues to have a difficult time controlling lower extremity swelling due to chronic arthritis inflammation in bilateral knees. We're hopeful that recent knee injections will provide some symptom relief. RLE swelling is decreased significantly compared with LLE. Provided MLD and simultaneous skin care to RLE/RLQ from wheel chair level due to transfers taking an inordinate amount of time and causing significant increase in knee pain.  Reapplied multilayer, knee length compression wraps as established.  On 04/22/24 CircAid garment order completed for RLE. Anatomical measurements reveal that Pt is unable to fit into an off-the-shelf, or ready made size of CircAid Juxtafit Essential. We completed measurements for a R , knee length, custom Circaid, adjustable compression legging today and submitted to DME vendor after session.  This custom compression garment alternative is medically necessary to control lymphedema below the knee being easier to don and doff than a standard elastic compression stocking. Pt's AROM limitations  limit her ability to don and doff compression knee highs.    Cont as per POC.  (02/12/24 Initial OT Eval : TAVA PEERY is a 73 yo female presenting with moderate, stage  II, bilateral lower extremity lymphedema 2/2 CVI,  obesity, and mm weakness. She will benefit from skilled Occupational Therapy to reduce limb swelling and associated pain, to limit progression and infection risk. BLE lymphedema limits functional performance in all occupational domains, including functional mobility and ambulation,  basic and instrumental ADLs, productive activities, leisure pursuits, social participation and quality of life. This patient will benefit from modified Intensive and Self Management Phase CDT . In addition to MLD, ther ex, skin care and compression bandaging below the knees, Pt will be fitted with custom knee length compression stockings paired with off the shelf , Capri length compression leggings. If insurance benefits allow, she may undergo a trial in the clinic with the advanced Flexitouch device in an effort to reduce discomfort and reduce infection risk. Without skilled OT Li-lymphedema will worsen over time and further functional decline is expected.  Ms Brave is unable to reach feet and distal legs to apply multilayer , gradient compression wraps during the Intensive Phase of CDT.  She understands this limitation and agrees to  arrange for a caregiver to assist her daily with compression wrapping between OT visits. With a caregiver to assist her with Intensive Phase compression her prognosis is fair. Without CG assistance her prognosis is poor.)  OBJECTIVE IMPAIRMENTS: decreased activity tolerance, decreased balance, decreased endurance, decreased knowledge of condition, decreased knowledge of use of DME, decreased mobility, difficulty walking, decreased ROM, decreased strength, increased edema, impaired UE functional use, postural dysfunction, obesity, pain, and chronic leg swelling.   ACTIVITY  LIMITATIONS: ACTIVITY LIMITATIONS: Mobility and functional ambulation limitations:  abnormal gait pattern, difficulty walking, carrying, lifting, bending, sitting, standing, squatting, stairs, transfers, and bed mobility Basic and instrumental ADLs (reaching feet and distal legs to groom nails, inspect skin, apply lotion, bathe lower body, difficulty with LB dressing, including fitting LB clothing, shoes and socks, impaired sleeping, meal prep, standing to cook, driving, shopping, yard work, house work Paediatric nurse activities: work related activities requiring extended standing, walking, sitting; caring for others Social participation in the community, socializing with others, LE affects  body image  Leisure pursuits requiring extended standing, walking, sitting  PERSONAL FACTORS: Fitness, Time since onset of injury/illness/exacerbation, and 3+ comorbidities: Obesity, arthritis, OSA are also affecting patient's functional outcome.   REHAB POTENTIAL: Fair with daily caregiver assistance with gradient compression wrapping. Without assistance prognosis is poor as Pt is unable to apply wraps independently  EVALUATION COMPLEXITY: Moderate   GOALS: Goals reviewed with patient? Yes   SHORT TERM GOALS: Target date: 4th OT Rx visit  Pt will demonstrate understanding of lymphedema precautions and prevention strategies with modified independence using a printed reference to identify at least 5 precautions and discussing how s/he may implement them into daily life to reduce risk of progression with modified assistance Baseline: max a Goal status:PROGRESSING   2.  With Max caregiver assistance Pt will be able to apply multilayer, knee length, compression wraps using gradient techniques to decrease limb volume, to limit infection risk, and to limit lymphedema progression.  Given this patient's Intake score of tbd % on the Lymphedema Life Impact Scale (LLIS), patient will experience a reduction of at least 5% in  her perceived level of functional impairment resulting from lymphedema to improve functional performance and quality of life (QOL). Baseline: Dependent Goal status: 03/31/24 GOAL MET     LONG TERM GOALS: Target date: 05/13/24 Given this patient's Intake score of 86.76 % on the Lymphedema Life Impact Scale (LLIS), patient will experience a reduction of at least 10% in her perceived level of functional impairment resulting from lymphedema to improve functional performance and quality of life (QOL).Baseline: tbd Baseline: max a Goal status: PROGRESSING     2.  With modified independence (extra time and assistive devices) Pt will be able to don and doff appropriate compression garments and/or devices to control BLE lymphedema and to limit progression.  Baseline: Dependent Goal status: PROGRESSING   3. Pt will achieve at least a 10% volume reductions bilaterally below the knees to return limb to more typical size and shape, to limit infection risk and LE progression, to decrease pain, to improve function, and to improve body image and QOL. Baseline: Dependent Goal status:PROGRESSING. Volumetrics reveal R LEG volume DECREASED by 24.3% since commencing OT for CDT on 02/17/24. This value meets and exceeds the initial 10% volume reduction goal for the RLE. Goal elevated to 25% bilaterally.    4. Pt will achieve and sustain at least 85% compliance with all LE self-care home program components throughout CDT, including modified simple self-MLD, daily skin care and inspection, lymphatic pumping the ex and appropriate compression to limit lymphedema progression and to limit further functional decline. Baseline: Dependent. (Note: W/out daily assistance with donning/ doffing compression  , prognosis is poor.) Goal status:PROGRESSING. Pt hired paid caregiver who assist her with wrapping. Bandaging very well done.   PLAN:  OT FREQUENCY: 2x/week and PRN  OT DURATION: other: 18 weeks and PRN  PLANNED  INTERVENTIONS:  Complete Decongestive Therapy (CDT): Manual Lymphatic Drainage (MLD) , Skin care, ther ex, gradient compression 97110-Therapeutic exercises, 97530- Therapeutic activity, 97535- Self Care, [id number]- Manual therapy, Patient/Family education, Manual lymph drainage, Compression bandaging, DME instructions, and trial with advanced, sequential, pneumatic, compression device (Tactile Medical Flexitouch) Pt will arrange for a caregiver to assist her daily with compression wrapping between OT visits.   PLAN FOR NEXT SESSION:  Pt/ caregiver edu Knee length, Multilayer compression wraps to R leg only Complete anatomical measurements to determine OTS or ready-made CircAids  Zebedee Dec, MS, OTR/L, CLT-LANA 04/29/24 9:11 AM' OUTPATIENT OCCUPATIONAL THERAPY  TREATMENT NOTE AND PROGRESS REPORT  BILATERAL LOWER EXTREMITY/ BLQ LYMPHEDEMA  Patient Name: Natasha [name] MRN: [id number] DOB:1951-08-26, 73 y.o., female Today's Date: 04/29/2024  REPORTING PERIOD: 02/12/24 -  04/07/24  END OF SESSION:  OT End of Session - 04/28/24 1512     Visit Number 13    Number of Visits 36    Date for OT Re-Evaluation 05/13/24    OT Start Time 0312    OT Stop Time 0401    OT Time Calculation (min) 49 min    Activity Tolerance Patient tolerated treatment well;No increased pain    Behavior During Therapy [facility] for tasks assessed/performed             Past Medical History:  Diagnosis Date   Abdominal spasms 12/14/2020   Acute pain of right shoulder 01/31/2023   Acute pulmonary embolism (HCC) 08/21/2019   Bacterial conjunctivitis of both eyes 12/14/2020   Bilateral leg edema 02/10/2020   Bradycardia    Breast cancer screening by mammogram 11/24/2020   Candida infection 10/11/2022   CARCINOMA, THYROID  GLAND, PAPILLARY 05/04/2008   Stage 2, 8/09: thyroidectomy for 2.7cm papillary adenocarcinoma (t2 n0 mo) 9/09: I-131 rx, 108 mci 05/10: tg is neg (ab neg) , total body scan is neg   Chronic  right-sided low back pain with right-sided sciatica 11/24/2020   Colon cancer screening 11/24/2020   Colon polyps    Complication of anesthesia    trouble with airway   Cough 12/25/2007   Qualifier: Diagnosis of  By: Germaine LPN, Megan     RNCPI-80 08/19/2019   Decreased ROM of neck 01/31/2023   Diverticulosis    DVT (deep venous thrombosis) (HCC)    Edema 12/22/2008   Encounter to establish care 11/24/2020   Groin pain, chronic, right 11/24/2020   Health care maintenance 11/26/2022   Hip pain 12/22/2020   History of 2019 novel coronavirus disease (COVID-19) 11/09/2019   HYPERTENSION 12/25/2007   HYPOTHYROIDISM, POSTSURGICAL 05/04/2008   LEG PAIN, LEFT 12/09/2008   Low TSH level 12/10/2022   Lumbago with sciatica, right side 01/08/2021   Memory loss due to medical condition 11/09/2019   Muscle weakness    Nausea and vomiting 05/23/2008   Qualifier: Diagnosis of  By: Kassie MD, Alyce LABOR   Formatting of this note might be different from the original. Qualifier: Diagnosis of  By: Kassie MD, Sean A   Neck pain 01/31/2023   Non-recurrent acute suppurative otitis media of left ear without spontaneous rupture of tympanic membrane 02/17/2023   Numbness and tingling    Obesity, morbid, BMI 50 or higher (HCC) 08/24/2019   OSA (obstructive sleep apnea) 06/15/2013   CPAP   Paresthesia 01/04/2020   Peripheral edema 12/22/2008   Qualifier: Diagnosis of  By: Delford, MD, CODY Maude Dunnings    Pneumonia due to COVID-19 virus    Pulmonary embolism (HCC)    Pulmonary embolism (HCC) 08/23/2019   Right lower quadrant pain 11/24/2020   Sciatica of left side 12/04/2011   Skin sensation disturbance 12/05/2008   Qualifier: Diagnosis of  By: Inocencio MD, Berwyn LABOR Deal of this note might be different from the original. Qualifier: Diagnosis of  By: Inocencio MD, Valerie A   Snoring 12/10/2022   Spasm of muscle of lower back 12/14/2020   Upper back pain 01/31/2023   Vaginal candidiasis  03/16/2021   Weakness 01/04/2020   Past Surgical History:  Procedure Laterality Date   ABDOMINAL HYSTERECTOMY     due to  bleeding, ovaries remain   ANKLE SURGERY Right    CERVICAL FUSION     x 2   COLONOSCOPY WITH PROPOFOL  N/A 08/08/2015   Procedure: COLONOSCOPY WITH PROPOFOL ;  Surgeon: Renaye Sous, MD;  Location: [facility];  Service: Endoscopy;  Laterality: N/A;   KNEE SURGERY Left    TOTAL THYROIDECTOMY     Patient Active Problem List   Diagnosis Date Noted   Anemia 03/04/2024   Chronic venous insufficiency 09/10/2023   Weakness of both lower extremities 09/10/2023   Atrophic vaginitis 05/14/2023   Pelvic floor dysfunction 05/14/2023   Pancreatic insufficiency 05/14/2023   Arthritis of knee 03/24/2023   At risk for fall due to comorbid condition 03/10/2023   Mood changes 02/17/2023   Recurrent urinary tract infection 01/31/2023   Walker as ambulation aid 01/31/2023   B12 deficiency 11/26/2022   Other fatigue 11/26/2022   Prediabetes 11/26/2022   Obesity, Class III, BMI 40-49.9 (morbid obesity) 11/26/2022   Dermatofibroma 10/11/2022   Fatty liver 04/20/2021   Incontinence of urine in female 04/20/2021   Vitamin D  deficiency 03/16/2021   Constipation 11/24/2020   Gastroesophageal reflux disease 11/24/2020   OAB (overactive bladder) 11/24/2020   Aortic atherosclerosis (HCC) 11/24/2020   Right-sided heart failure (HCC) 11/24/2020   History of pulmonary embolism 02/10/2020   Lymphedema 02/10/2020   Mobility impaired 01/04/2020   Muscle weakness (generalized) 11/09/2019   Pulmonary nodule 11/09/2019   OSA (obstructive sleep apnea) 06/15/2013   Allergic rhinitis 01/21/2009   History of papillary adenocarcinoma of thyroid  05/04/2008   Hypothyroidism, postsurgical 05/04/2008   Essential hypertension 12/25/2007   SOB (shortness of breath) on exertion 12/25/2007    PCP: Natasha Doing, NP  REFERRING PROVIDER: Camie Doing, NP  REFERRING DIAG: I89.0  THERAPY DIAG:   Lymphedema, not elsewhere classified  Rationale for Evaluation and Treatment: Rehabilitation  ONSET DATE: >15 years  SUBJECTIVE:                                                                                                                                                                                          SUBJECTIVE STATEMENT: Ms. Rogness presents to OT in manual transport wc for LE care.Pt is accompanied by daughter. Pt is 15 minutes late for a 60 minutes session. Pt has a PT session after OT today.  Pt tells me she has been able wrap daily with assistance from her daughter. Pt states she has been using the compression pump on the LLE, the yet to be treated leg. Pt reports 6/10 knee pain with transfers and increased swelling in the L Leg today.  PERTINENT HISTORY:  Hypothyroidism, postsurgical Essential hypertension  Dyspnea OSA (obstructive sleep apnea) Obesity, morbid, BMI 50 or higher (HCC) Muscle weakness (generalized) Gait abnormality History of pulmonary embolism Bilateral lower extremity edema Chronic right-sided low back pain with right-sided sciatica Right-sided heart failure (HCC) Hip pain Incontinence of urine in female Dermatofibroma Morbid obesity (HCC) BMI 50.0-59.9, adult (HCC) SOB (shortness of breath) on exertion Other fatigue Prediabetes Decreased ROM of neck Upper back pain Acute pain of right shoulder Recurrent urinary tract infection Urine frequency Walker as ambulation aid Ambulatory dysfunction Mood changes At risk for fall due to comorbid condition Arthritis of knee Hypothyroidism Chronic venous insufficiency Mobility impaired Weakness of both lower extremities Gait instability Lymphedema BMI 45.0-49.9, adult (HCC)  PAIN:  Are you having pain? Yes, B knees: NPRS scale: 6/10 Pain location:  Pain description: creaky crackly, tight, heavy, sore, tender. With and without weight bearing Aggravating factors: standing, walking, weight  bearing Relieving factors: walking  PRECAUTIONS: Other: LYMPHEDEMA PRECAUTIONS: prediabetic, hypothyroid,  RED FLAGS: Urinary incontinence; numbness and tingling in fingers, hands and toes   WEIGHT BEARING RESTRICTIONS: No  FALLS:  Has patient fallen in last 6 months? No  LIVING ENVIRONMENT: Lives with: lives with their daughter and  granddaughter Lives in: House/apartment Stairs: Yes; Internal: 14 steps; on right going up and External: garage 4 steps steps; can reach both Has following equipment at home: Walker - 4 wheeled, shower chair, and elevated toilet seat  OCCUPATION: retired Careers information officer professor  LEISURE: concerts, writing and producing plays  HAND DOMINANCE: right   PRIOR LEVEL OF FUNCTION: Independent  PATIENT GOALS: walk unassisted; lose weight   OBJECTIVE: Note: Objective measures were completed at Evaluation unless otherwise noted.  COGNITION:  Overall cognitive status: Within functional limits for tasks assessed   OBSERVATIONS / OTHER ASSESSMENTS:   POSTURE: head forward, slight shoulder protraction  LE ROM: Limited at hips knees and ankles 2/2 body habitus and skin approximation  LE MMT: generalized weakness  LYMPHEDEMA ASSESSMENTS:   BLE COMPARATIVE LIMB VOLUMETRICS: Initial 02/17/24  LANDMARK RIGHT   R LEG (A-D) 6530.2 ml  R THIGH (E-G) ml  R FULL LIMB (A-G) ml  Limb Volume differential (LVD)  %  Volume change since initial %  Volume change overall V  (Blank rows = not tested)  LANDMARK LEFT  L LEG (A-D) 6601.0  L  THIGH (E-G) ml  L  FULL LIMB (A-G) ml  Limb Volume differential (LVD)  1.08% L>R  Volume change since initial %  Volume change overall %  (Blank rows = not tested)    RLE COMPARATIVE LIMB VOLUMETRICS: 9th visit 03/31/24   LANDMARK RIGHT   R LEG (A-D) 4941.4 ml  R THIGH (E-G) ml  R FULL LIMB (A-G) ml  Limb Volume differential (LVD)  %  Volume change since initial R LEG volume DECREASED by 24.3% since commencing OT for CDT  on 02/17/24   Volume change overall V  (Blank rows = not tested)    SKIN/TISSUE INTEGRITY: : Moderate, Stage  II, Bilateral Lower Extremity Lymphedema 2/2 CVI,  Obesity, and Mm weakness  Skin  Description Hyper-Keratosis Peau d' Orange Shiny Tight Fibrotic/ Indurated Fatty Hard Spongy/ boggy   x   x R>L x x x   Skin dry Flaky WNL Macerated   mildly      Color Redness Varicosities Blanching Hemosiderin Stain Mottled        x   Odor Malodorous Yeast Fungal infection  WNL      x   Temperature Warm Cool wnl  x     Pitting Edema   1+ 2+ 3+ 4+ Non-pitting   x         Girth Symmetrical Asymmetrical                   Distribution    R>L toes to groin, bilaterally    Stemmer Sign Positive Negative   +    Lymphorrhea History Of:  Present Absent     x    Wounds History Of Present Absent Venous Arterial Pressure Sheer   denies  x        Signs of Infection Redness Warmth Erythema Acute Swelling Drainage Borders                    Sensation Light Touch Deep pressure Hypersensitivity   In tact Impaired In tact Impaired Absent Impaired   x  x  x     Nails WNL   Fungus nail dystrophy   TBA     Hair Growth Symmetrical Asymmetrical   TB Ax    Skin Creases Base of toes  Ankles   Base of Fingers knees       Abdominal pannus Thigh Lobules  Face/neck   x x  x       GAIT: Pt transported to clinic in transport wheelchair Distance walked: Pt able to transfer out of wc and walk 4-5 steps using 2 wheeled walker to Rx bed with extra time (modified independent).  Assistive device utilized: Pt able to lift legs onto Rx bed, one at a time, using hands to actively assist Level of assistance: Modified independence Comments: Transfers  and bed mobility take an inordinate amount of time  LYMPHEDEMA LIFE IMPACT SCALE (LLIS): Initial: 86.76% (The extent to which LE-related problems impacted your life over the past week.)                                                                                                                            TREATMENT THIS DATE: RLE MLD Compression wrapping Pt/ CG edu   PATIENT EDUCATION:  Continued Pt/ CG edu for lymphedema self care home program throughout session. Topics include outcome of comparative limb volumetrics- starting limb volume differentials (LVDs), technology and gradient techniques used for short stretch, multilayer compression wrapping, simple self-MLD, therapeutic lymphatic pumping exercises, skin/nail care, LE precautions, compression garment recommendations and specifications, wear and care schedule and compression garment donning / doffing w assistive devices. Discussed progress towards all OT goals since commencing CDT. Discussed detrimental impact of obesity on lower and upper extremity lymphedema over time. Reviewed OT goals for lymphedema care with Pt and discussed progress to date.  All questions answered to the Pt's satisfaction. Good return. Person educated: Patient and family Education method: Explanation, Demonstration, and Handouts Education comprehension: verbalized understanding, returned demonstration, verbal cues required, and needs further education  LYMPHEDEMA SELF-CARE HOME PROGRAM: BLE lymphatic pumping there ex- 1 set of 10 each element, in  order. Hold 5. 2 x daily 2. Daily, short stretch, thigh length, multilayer compression bandages during Intensive Phase CDT 3. During self-Management Phase fit with appropriate compression garments and/ or devices 3. Daily skin care with low ph lotion matching skin ph 4. Daily simple self MLD   ASSESSMENT:  CLINICAL IMPRESSION:Pt continues to make slow but steady progress towards OT goals for lymphedema care. Please refer to GOALS section below for detailed report on each goal. To date R LEG volume is DECREASED by 24.3% since commencing OT for CDT on 02/17/24. This value meets and exceeds the initial 10% volume reduction goal for the RLE. Goal elevated  to 25% bilaterally. Applied R LE gradient compression wraps. We were unable to finalize measurements and specifications for CircAids today due to time constraints, but we were able to determine that Pt does fit into a ready to wear size. We'll submit measurements to DME vendor after next session when we complete R leg measurements. We'll shift Rx to LLE after RLE garment is fitted. LLE remains very swollen, dense and hard to palpation.   Pt is ready to be fit with custom RLE , knee length, adjustable, Velcro wrap-style compression legging, or CircAid Juxtafit Essential. The LLE treatment and fitting will commence after RLE garment fitting is complete. The CircAid legging is needed to enable patient to don and doff the legging with the highest level of independence possible. It is also caregiver friendly. This garment is preferred because it is the only manufactured adjustable garment with a built in gauging system that enables Pt's to see when they have applied correct amount of containment and compression to control their lymphedema.   Fit with: 4 total quantity- (2 RLE and @ LLE) Velcro wrap-style compression legging, CircAid Juxtafit Essential lower leg  ( one to wash and one to wear) 2 quantity (1 R and 1 Left)  Mediven CircAid comfort accessory kit, LARGE, containing compression anklets, leg liners (under socks) , 1 cover up and Pac bands to control foot swelling  Custom-made gradient compression garments  are medically necessary because they are uniquely sized and shaped to fit the exact dimensions of the affected extremities, and to provide appropriate medical grade, graduated compression essential for optimally managing chronic, progressive lymphedema. Multiple custom compression garments are needed to ensure proper hygiene to limit infection risk. Custom compression garments should be replaced q 3-6 months when worn consistently for optimal lymphedema self-management over time they will wear out and  require replacement. HOS devices, medically necessary to limit fibrosis buildup in tissue, should be replaced q 2 years and PRN when worn out.   Cont as per POC.  (02/12/24 Initial OT Eval : EBBIE SORENSON is a 73 yo female presenting with moderate, stage  II, bilateral lower extremity lymphedema 2/2 CVI,  obesity, and mm weakness. She will benefit from skilled Occupational Therapy to reduce limb swelling and associated pain, to limit progression and infection risk. BLE lymphedema limits functional performance in all occupational domains, including functional mobility and ambulation,  basic and instrumental ADLs, productive activities, leisure pursuits, social participation and quality of life. This patient will benefit from modified Intensive and Self Management Phase CDT . In addition to MLD, ther ex, skin care and compression bandaging below the knees, Pt will be fitted with custom knee length compression stockings paired with off the shelf , Capri length compression leggings. If insurance benefits allow, she may undergo a trial in the clinic with the advanced Flexitouch device in an effort  to reduce discomfort and reduce infection risk. Without skilled OT Li-lymphedema will worsen over time and further functional decline is expected.    Ms Kittleson is unable to reach feet and distal legs to apply multilayer , gradient compression wraps during the Intensive Phase of CDT.  She understands this limitation and agrees to  arrange for a caregiver to assist her daily with compression wrapping between OT visits. With a caregiver to assist her with Intensive Phase compression her prognosis is fair. Without CG assistance her prognosis is poor.)  OBJECTIVE IMPAIRMENTS: decreased activity tolerance, decreased balance, decreased endurance, decreased knowledge of condition, decreased knowledge of use of DME, decreased mobility, difficulty walking, decreased ROM, decreased strength, increased edema, impaired UE  functional use, postural dysfunction, obesity, pain, and chronic leg swelling.   ACTIVITY LIMITATIONS: ACTIVITY LIMITATIONS: Mobility and functional ambulation limitations:  abnormal gait pattern, difficulty walking, carrying, lifting, bending, sitting, standing, squatting, stairs, transfers, and bed mobility Basic and instrumental ADLs (reaching feet and distal legs to groom nails, inspect skin, apply lotion, bathe lower body, difficulty with LB dressing, including fitting LB clothing, shoes and socks, impaired sleeping, meal prep, standing to cook, driving, shopping, yard work, house work Paediatric nurse activities: work related activities requiring extended standing, walking, sitting; caring for others Social participation in the community, socializing with others, LE affects body image  Leisure pursuits requiring extended standing, walking, sitting  PERSONAL FACTORS: Fitness, Time since onset of injury/illness/exacerbation, and 3+ comorbidities: Obesity, arthritis, OSA are also affecting patient's functional outcome.   REHAB POTENTIAL: Fair with daily caregiver assistance with gradient compression wrapping. Without assistance prognosis is poor as Pt is unable to apply wraps independently  EVALUATION COMPLEXITY: Moderate   GOALS: Goals reviewed with patient? Yes   SHORT TERM GOALS: Target date: 4th OT Rx visit  Pt will demonstrate understanding of lymphedema precautions and prevention strategies with modified independence using a printed reference to identify at least 5 precautions and discussing how s/he may implement them into daily life to reduce risk of progression with modified assistance Baseline: max a Goal status:PROGRESSING   2.  With Max caregiver assistance Pt will be able to apply multilayer, knee length, compression wraps using gradient techniques to decrease limb volume, to limit infection risk, and to limit lymphedema progression.  Given this patient's Intake score of tbd % on  the Lymphedema Life Impact Scale (LLIS), patient will experience a reduction of at least 5% in her perceived level of functional impairment resulting from lymphedema to improve functional performance and quality of life (QOL). Baseline: Dependent Goal status: 03/31/24 GOAL MET     LONG TERM GOALS: Target date: 05/13/24 Given this patient's Intake score of 86.76 % on the Lymphedema Life Impact Scale (LLIS), patient will experience a reduction of at least 10% in her perceived level of functional impairment resulting from lymphedema to improve functional performance and quality of life (QOL).Baseline: tbd Baseline: max a Goal status: PROGRESSING     2.  With modified independence (extra time and assistive devices) Pt will be able to don and doff appropriate compression garments and/or devices to control BLE lymphedema and to limit progression.  Baseline: Dependent Goal status: PROGRESSING   3. Pt will achieve at least a 10% volume reductions bilaterally below the knees to return limb to more typical size and shape, to limit infection risk and LE progression, to decrease pain, to improve function, and to improve body image and QOL. Baseline: Dependent Goal status:PROGRESSING. Volumetrics reveal R LEG volume DECREASED by 24.3%  since commencing OT for CDT on 02/17/24. This value meets and exceeds the initial 10% volume reduction goal for the RLE. Goal elevated to 25% bilaterally.    4. Pt will achieve and sustain at least 85% compliance with all LE self-care home program components throughout CDT, including modified simple self-MLD, daily skin care and inspection, lymphatic pumping the ex and appropriate compression to limit lymphedema progression and to limit further functional decline. Baseline: Dependent. (Note: W/out daily assistance with donning/ doffing compression  , prognosis is poor.) Goal status:PROGRESSING. Pt hired paid caregiver who assist her with wrapping. Bandaging very well  done.   PLAN:  OT FREQUENCY: 2x/week and PRN  OT DURATION: other: 18 weeks and PRN  PLANNED INTERVENTIONS:  Complete Decongestive Therapy (CDT): Manual Lymphatic Drainage (MLD) , Skin care, ther ex, gradient compression 97110-Therapeutic exercises, 97530- Therapeutic activity, 97535- Self Care, [id number]- Manual therapy, Patient/Family education, Manual lymph drainage, Compression bandaging, DME instructions, and trial with advanced, sequential, pneumatic, compression device (Tactile Medical Flexitouch) Pt will arrange for a caregiver to assist her daily with compression wrapping between OT visits.   PLAN FOR NEXT SESSION:  RLE MLD w simultaneous skin care Pt/ caregiver edu Knee length, Multilayer compression wraps to R leg only   Zebedee Dec, MS, OTR/L, CLT-LANA 04/29/24 9:11 AM'

## 2024-04-30 ENCOUNTER — Ambulatory Visit

## 2024-04-30 ENCOUNTER — Other Ambulatory Visit: Payer: Self-pay | Admitting: Medical

## 2024-04-30 ENCOUNTER — Encounter: Admitting: Occupational Therapy

## 2024-04-30 DIAGNOSIS — I10 Essential (primary) hypertension: Secondary | ICD-10-CM

## 2024-05-04 DIAGNOSIS — M170 Bilateral primary osteoarthritis of knee: Secondary | ICD-10-CM | POA: Diagnosis not present

## 2024-05-04 DIAGNOSIS — M25562 Pain in left knee: Secondary | ICD-10-CM | POA: Diagnosis not present

## 2024-05-04 DIAGNOSIS — R262 Difficulty in walking, not elsewhere classified: Secondary | ICD-10-CM | POA: Diagnosis not present

## 2024-05-04 DIAGNOSIS — M25561 Pain in right knee: Secondary | ICD-10-CM | POA: Diagnosis not present

## 2024-05-04 NOTE — Telephone Encounter (Signed)
 I do not see this on her med list but I don't see where a note saying this was to be discontinued either unless I am overlooking it. Sending to you just to advise

## 2024-05-05 ENCOUNTER — Ambulatory Visit: Admitting: Physical Therapy

## 2024-05-05 ENCOUNTER — Ambulatory Visit: Attending: Nurse Practitioner | Admitting: Occupational Therapy

## 2024-05-05 DIAGNOSIS — R262 Difficulty in walking, not elsewhere classified: Secondary | ICD-10-CM | POA: Diagnosis not present

## 2024-05-05 DIAGNOSIS — M6281 Muscle weakness (generalized): Secondary | ICD-10-CM | POA: Diagnosis not present

## 2024-05-05 DIAGNOSIS — M25562 Pain in left knee: Secondary | ICD-10-CM | POA: Insufficient documentation

## 2024-05-05 DIAGNOSIS — I890 Lymphedema, not elsewhere classified: Secondary | ICD-10-CM | POA: Diagnosis not present

## 2024-05-05 DIAGNOSIS — R260 Ataxic gait: Secondary | ICD-10-CM | POA: Diagnosis not present

## 2024-05-05 DIAGNOSIS — G8929 Other chronic pain: Secondary | ICD-10-CM | POA: Insufficient documentation

## 2024-05-05 DIAGNOSIS — R2689 Other abnormalities of gait and mobility: Secondary | ICD-10-CM | POA: Insufficient documentation

## 2024-05-05 DIAGNOSIS — M25561 Pain in right knee: Secondary | ICD-10-CM | POA: Diagnosis not present

## 2024-05-05 NOTE — Therapy (Signed)
 OUTPATIENT OCCUPATIONAL THERAPY TREATMENT NOTE   BILATERAL LOWER EXTREMITY/ BLQ LYMPHEDEMA  Patient Name: Natasha [name] MRN: [id number] DOB:[birth year], 73 y.o., female Today's Date: 05/05/2024  REPORTING PERIOD:  END OF SESSION:  OT End of Session - 05/05/24 1527     Visit Number 14    Number of Visits 36    Date for OT Re-Evaluation 05/13/24    OT Start Time 0316    OT Stop Time 0346    OT Time Calculation (min) 30 min    Activity Tolerance Patient tolerated treatment well;No increased pain    Behavior During Therapy [facility] for tasks assessed/performed             Past Medical History:  Diagnosis Date   Abdominal spasms 12/14/2020   Acute pain of right shoulder 01/31/2023   Acute pulmonary embolism (HCC) 08/21/2019   Bacterial conjunctivitis of both eyes 12/14/2020   Bilateral leg edema 02/10/2020   Bradycardia    Breast cancer screening by mammogram 11/24/2020   Candida infection 10/11/2022   CARCINOMA, THYROID  GLAND, PAPILLARY 05/04/2008   Stage 2, 8/09: thyroidectomy for 2.7cm papillary adenocarcinoma (t2 n0 mo) 9/09: I-131 rx, 108 [facility]ci 05/10: tg is neg (ab neg) , total body scan is neg   Chronic right-sided low back pain with right-sided sciatica 11/24/2020   Colon cancer screening 11/24/2020   Colon polyps    Complication of anesthesia    trouble with airway   Cough 12/25/2007   Qualifier: Diagnosis of  By: Germaine LPN, Megan     RNCPI-80 08/19/2019   Decreased ROM of neck 01/31/2023   Diverticulosis    DVT (deep venous thrombosis) (HCC)    Edema 12/22/2008   Encounter to establish care 11/24/2020   Groin pain, chronic, right 11/24/2020   Health care maintenance 11/26/2022   Hip pain 12/22/2020   History of 2019 novel coronavirus disease (COVID-19) 11/09/2019   HYPERTENSION 12/25/2007   HYPOTHYROIDISM, POSTSURGICAL 05/04/2008   LEG PAIN, LEFT 12/09/2008   Low TSH level 12/10/2022   Lumbago with sciatica, right side 01/08/2021   Memory loss due to  medical condition 11/09/2019   Muscle weakness    Nausea and vomiting 05/23/2008   Qualifier: Diagnosis of  By: Kassie MD, Alyce LABOR   Formatting of this note might be different from the original. Qualifier: Diagnosis of  By: Kassie MD, Sean A   Neck pain 01/31/2023   Non-recurrent acute suppurative otitis media of left ear without spontaneous rupture of tympanic membrane 02/17/2023   Numbness and tingling    Obesity, morbid, BMI 50 or higher (HCC) 08/24/2019   OSA (obstructive sleep apnea) 06/15/2013   CPAP   Paresthesia 01/04/2020   Peripheral edema 12/22/2008   Qualifier: Diagnosis of  By: Delford, MD, CODY Maude Dunnings    Pneumonia due to COVID-19 virus    Pulmonary embolism (HCC)    Pulmonary embolism (HCC) 08/23/2019   Right lower quadrant pain 11/24/2020   Sciatica of left side 12/04/2011   Skin sensation disturbance 12/05/2008   Qualifier: Diagnosis of  By: Inocencio MD, Berwyn LABOR Deal of this note might be different from the original. Qualifier: Diagnosis of  By: Inocencio MD, Valerie A   Snoring 12/10/2022   Spasm of muscle of lower back 12/14/2020   Upper back pain 01/31/2023   Vaginal candidiasis 03/16/2021   Weakness 01/04/2020   Past Surgical History:  Procedure Laterality Date   ABDOMINAL HYSTERECTOMY     due to bleeding, ovaries remain  ANKLE SURGERY Right    CERVICAL FUSION     x 2   COLONOSCOPY WITH PROPOFOL  N/A 08/08/2015   Procedure: COLONOSCOPY WITH PROPOFOL ;  Surgeon: Renaye Sous, MD;  Location: [facility];  Service: Endoscopy;  Laterality: N/A;   KNEE SURGERY Left    TOTAL THYROIDECTOMY     Patient Active Problem List   Diagnosis Date Noted   Anemia 03/04/2024   Chronic venous insufficiency 09/10/2023   Weakness of both lower extremities 09/10/2023   Atrophic vaginitis 05/14/2023   Pelvic floor dysfunction 05/14/2023   Pancreatic insufficiency 05/14/2023   Arthritis of knee 03/24/2023   At risk for fall due to comorbid condition  03/10/2023   Mood changes 02/17/2023   Recurrent urinary tract infection 01/31/2023   Walker as ambulation aid 01/31/2023   B12 deficiency 11/26/2022   Other fatigue 11/26/2022   Prediabetes 11/26/2022   Obesity, Class III, BMI 40-49.9 (morbid obesity) 11/26/2022   Dermatofibroma 10/11/2022   Fatty liver 04/20/2021   Incontinence of urine in female 04/20/2021   Vitamin D  deficiency 03/16/2021   Constipation 11/24/2020   Gastroesophageal reflux disease 11/24/2020   OAB (overactive bladder) 11/24/2020   Aortic atherosclerosis (HCC) 11/24/2020   Right-sided heart failure (HCC) 11/24/2020   History of pulmonary embolism 02/10/2020   Lymphedema 02/10/2020   Mobility impaired 01/04/2020   Muscle weakness (generalized) 11/09/2019   Pulmonary nodule 11/09/2019   OSA (obstructive sleep apnea) 06/15/2013   Allergic rhinitis 01/21/2009   History of papillary adenocarcinoma of thyroid  05/04/2008   Hypothyroidism, postsurgical 05/04/2008   Essential hypertension 12/25/2007   SOB (shortness of breath) on exertion 12/25/2007    PCP: Camie Doing, NP  REFERRING PROVIDER: Camie Doing, NP  REFERRING DIAG: I89.0  THERAPY DIAG:  Lymphedema, not elsewhere classified  Rationale for Evaluation and Treatment: Rehabilitation  ONSET DATE: >15 years  SUBJECTIVE:                                                                                                                                                                                          SUBJECTIVE STATEMENT: Ms. Thinnes presents to OT in manual transport wc for LE care. Pt is accompanied by her granddaughter and her friend. Pt is 15 minutes late for a 60 minute session. We reviewed the Rehab attendance policy and discussed trying to arrive b  on time for therapy. Tardiness has become a pattern and Pt is not getting the benefits of a full hour long therapy session.  Pt agrees and will work on arriving earlier for appointments. Due to time  constraints session was abbreviated today.  Pt states so far has not had  any relief from shots in her knees.  Pt reports knee pain today but does not rate it numerically. Treated RLE seated in transport wc with leg on footstool to limit transfer time and pain. Pt had email from DME vendor received  her garment order and is working on submitting her paperwork. OT encouraged Pt to call referring provider's office and let them know it's on the way and to please sign and return to vendor ASAP so process is not delayed.  PERTINENT HISTORY:  Hypothyroidism, postsurgical Essential hypertension Dyspnea OSA (obstructive sleep apnea) Obesity, morbid, BMI 50 or higher (HCC) Muscle weakness (generalized) Gait abnormality History of pulmonary embolism Bilateral lower extremity edema Chronic right-sided low back pain with right-sided sciatica Right-sided heart failure (HCC) Hip pain Incontinence of urine in female Dermatofibroma Morbid obesity (HCC) BMI 50.0-59.9, adult (HCC) SOB (shortness of breath) on exertion Other fatigue Prediabetes Decreased ROM of neck Upper back pain Acute pain of right shoulder Recurrent urinary tract infection Urine frequency Walker as ambulation aid Ambulatory dysfunction Mood changes At risk for fall due to comorbid condition Arthritis of knee Hypothyroidism Chronic venous insufficiency Mobility impaired Weakness of both lower extremities Gait instability Lymphedema BMI 45.0-49.9, adult (HCC)  PAIN:  Are you having pain? Yes, B knees: NPRS scale: /10 Pain location: seated and at rest Pain description: creaky crackly, tight, heavy, sore, tender. With and without weight bearing Aggravating factors: standing, walking, weight bearing Relieving factors: walking  PRECAUTIONS: Other: LYMPHEDEMA PRECAUTIONS: prediabetic, hypothyroid, Fall Risk  RED FLAGS: Urinary incontinence; numbness and tingling in fingers, hands and toes   WEIGHT BEARING RESTRICTIONS:  No  FALLS:  Has patient fallen in last 6 months? No  LIVING ENVIRONMENT: Lives with: lives with their daughter and  granddaughter Lives in: House/apartment Stairs: Yes; Internal: 14 steps; on right going up and External: garage 4 steps steps; can reach both Has following equipment at home: Walker - 4 wheeled, shower chair, and elevated toilet seat  OCCUPATION: retired Careers information officer professor  LEISURE: concerts, writing and producing plays  HAND DOMINANCE: right   PRIOR LEVEL OF FUNCTION: Independent  PATIENT GOALS: walk unassisted; lose weight   OBJECTIVE: Note: Objective measures were completed at Evaluation unless otherwise noted.  COGNITION:  Overall cognitive status: Within functional limits for tasks assessed   OBSERVATIONS / OTHER ASSESSMENTS:   POSTURE: head forward, slight shoulder protraction  LE ROM: Limited at hips knees and ankles 2/2 body habitus and skin approximation  LE MMT: generalized weakness  LYMPHEDEMA ASSESSMENTS:   BLE COMPARATIVE LIMB VOLUMETRICS: Initial 02/17/24  LANDMARK RIGHT   R LEG (A-D) 6530.2 ml  R THIGH (E-G) ml  R FULL LIMB (A-G) ml  Limb Volume differential (LVD)  %  Volume change since initial %  Volume change overall V  (Blank rows = not tested)  LANDMARK LEFT  L LEG (A-D) 6601.0  L  THIGH (E-G) ml  L  FULL LIMB (A-G) ml  Limb Volume differential (LVD)  1.08% L>R  Volume change since initial %  Volume change overall %  (Blank rows = not tested)    RLE COMPARATIVE LIMB VOLUMETRICS: 9th visit 03/31/24   LANDMARK RIGHT   R LEG (A-D) 4941.4 ml  R THIGH (E-G) ml  R FULL LIMB (A-G) ml  Limb Volume differential (LVD)  %  Volume change since initial R LEG volume DECREASED by 24.3% since commencing OT for CDT on 02/17/24   Volume change overall V  (Blank rows = not tested)    SKIN/TISSUE  INTEGRITY: : Moderate, Stage  II, Bilateral Lower Extremity Lymphedema 2/2 CVI,  Obesity, and Mm weakness  Skin  Description  Hyper-Keratosis Peau d' Orange Shiny Tight Fibrotic/ Indurated Fatty Hard Spongy/ boggy   x   x R>L x x x   Skin dry Flaky WNL Macerated   mildly      Color Redness Varicosities Blanching Hemosiderin Stain Mottled        x   Odor Malodorous Yeast Fungal infection  WNL      x   Temperature Warm Cool wnl    x     Pitting Edema   1+ 2+ 3+ 4+ Non-pitting   x         Girth Symmetrical Asymmetrical                   Distribution    R>L toes to groin, bilaterally    Stemmer Sign Positive Negative   +    Lymphorrhea History Of:  Present Absent     x    Wounds History Of Present Absent Venous Arterial Pressure Sheer   denies  x        Signs of Infection Redness Warmth Erythema Acute Swelling Drainage Borders                    Sensation Light Touch Deep pressure Hypersensitivity   In tact Impaired In tact Impaired Absent Impaired   x  x  x     Nails WNL   Fungus nail dystrophy   TBA     Hair Growth Symmetrical Asymmetrical   TB Ax    Skin Creases Base of toes  Ankles   Base of Fingers knees       Abdominal pannus Thigh Lobules  Face/neck   x x  x       GAIT: Pt transported to clinic in transport wheelchair Distance walked: Pt able to transfer out of wc and walk 4-5 steps using 2 wheeled walker to Rx bed with extra time (modified independent).  Assistive device utilized: Pt able to lift legs onto Rx bed, one at a time, using hands to actively assist Level of assistance: Modified independence Comments: Transfers  and bed mobility take an inordinate amount of time  LYMPHEDEMA LIFE IMPACT SCALE (LLIS): Initial: 86.76% (The extent to which LE-related problems impacted your life over the past week.)                                                                                                                           TREATMENT THIS DATE: Compression wrapping Pt/ CG edu   PATIENT EDUCATION:  Continued Pt/ CG edu for lymphedema self care home program  throughout session. Topics include outcome of comparative limb volumetrics- starting limb volume differentials (LVDs), technology and gradient techniques used for short stretch, multilayer compression wrapping, simple self-MLD, therapeutic lymphatic pumping exercises, skin/nail care, LE precautions, compression garment recommendations and specifications, wear and care  schedule and compression garment donning / doffing w assistive devices. Discussed progress towards all OT goals since commencing CDT. Discussed detrimental impact of obesity on lower and upper extremity lymphedema over time. Reviewed OT goals for lymphedema care with Pt and discussed progress to date.  All questions answered to the Pt's satisfaction. Good return. Person educated: Patient and family Education method: Explanation, Demonstration, and Handouts Education comprehension: verbalized understanding, returned demonstration, verbal cues required, and needs further education  LYMPHEDEMA SELF-CARE HOME PROGRAM: BLE lymphatic pumping there ex- 1 set of 10 each element, in order. Hold 5. 2 x daily 2. Daily, short stretch, thigh length, multilayer compression bandages during Intensive Phase CDT 3. During self-Management Phase fit with appropriate compression garments and/ or devices 3. Daily skin care with low ph lotion matching skin ph 4. Daily simple self MLD   ASSESSMENT:  CLINICAL IMPRESSION:Pt continues to have a difficult time controlling lower extremity swelling due to chronic arthritis inflammation in bilateral knees. We're hopeful that recent knee injections will provide some symptom relief. RLE swelling is decreased significantly compared with LLE. No MLD today due to time constraints. Session devoted to Pt edu simultaneously with RLE multi layer compression bandaging. Pt education for modifying shoe laces to improve access to shoes when feet are swollen and when wrapped. Pt able to re lace 2nd shoe and able to don and doff  shoes with much less physical effort and much quicker.   On 04/22/24 CircAid compression garment alternative order completed for RLE. Anatomical measurements reveal that Pt is unable to fit into an off-the-shelf, or ready made size of CircAid Juxtafit Essential. We completed measurements for a R legging today.  This custom compression garment alternative is medically necessary to control lymphedema below the knee being easier to don and doff than a standard elastic compression stocking. Pt's AROM limitations limit her ability to don and doff compression knee highs.    Cont as per POC.  (02/12/24 Initial OT Eval : LUCILIA YANNI is a 73 yo female presenting with moderate, stage  II, bilateral lower extremity lymphedema 2/2 CVI,  obesity, and mm weakness. She will benefit from skilled Occupational Therapy to reduce limb swelling and associated pain, to limit progression and infection risk. BLE lymphedema limits functional performance in all occupational domains, including functional mobility and ambulation,  basic and instrumental ADLs, productive activities, leisure pursuits, social participation and quality of life. This patient will benefit from modified Intensive and Self Management Phase CDT . In addition to MLD, ther ex, skin care and compression bandaging below the knees, Pt will be fitted with custom knee length compression stockings paired with off the shelf , Capri length compression leggings. If insurance benefits allow, she may undergo a trial in the clinic with the advanced Flexitouch device in an effort to reduce discomfort and reduce infection risk. Without skilled OT Li-lymphedema will worsen over time and further functional decline is expected.  Ms Alewine is unable to reach feet and distal legs to apply multilayer , gradient compression wraps during the Intensive Phase of CDT.  She understands this limitation and agrees to  arrange for a caregiver to assist her daily with compression wrapping  between OT visits. With a caregiver to assist her with Intensive Phase compression her prognosis is fair. Without CG assistance her prognosis is poor.)  OBJECTIVE IMPAIRMENTS: decreased activity tolerance, decreased balance, decreased endurance, decreased knowledge of condition, decreased knowledge of use of DME, decreased mobility, difficulty walking, decreased ROM, decreased strength, increased edema,  impaired UE functional use, postural dysfunction, obesity, pain, and chronic leg swelling.   ACTIVITY LIMITATIONS: ACTIVITY LIMITATIONS: Mobility and functional ambulation limitations:  abnormal gait pattern, difficulty walking, carrying, lifting, bending, sitting, standing, squatting, stairs, transfers, and bed mobility Basic and instrumental ADLs (reaching feet and distal legs to groom nails, inspect skin, apply lotion, bathe lower body, difficulty with LB dressing, including fitting LB clothing, shoes and socks, impaired sleeping, meal prep, standing to cook, driving, shopping, yard work, house work Paediatric nurse activities: work related activities requiring extended standing, walking, sitting; caring for others Social participation in the community, socializing with others, LE affects body image  Leisure pursuits requiring extended standing, walking, sitting  PERSONAL FACTORS: Fitness, Time since onset of injury/illness/exacerbation, and 3+ comorbidities: Obesity, arthritis, OSA are also affecting patient's functional outcome.   REHAB POTENTIAL: Fair with daily caregiver assistance with gradient compression wrapping. Without assistance prognosis is poor as Pt is unable to apply wraps independently  EVALUATION COMPLEXITY: Moderate   GOALS: Goals reviewed with patient? Yes   SHORT TERM GOALS: Target date: 4th OT Rx visit  Pt will demonstrate understanding of lymphedema precautions and prevention strategies with modified independence using a printed reference to identify at least 5 precautions and  discussing how s/he may implement them into daily life to reduce risk of progression with modified assistance Baseline: max a Goal status:PROGRESSING   2.  With Max caregiver assistance Pt will be able to apply multilayer, knee length, compression wraps using gradient techniques to decrease limb volume, to limit infection risk, and to limit lymphedema progression.  Given this patient's Intake score of tbd % on the Lymphedema Life Impact Scale (LLIS), patient will experience a reduction of at least 5% in her perceived level of functional impairment resulting from lymphedema to improve functional performance and quality of life (QOL). Baseline: Dependent Goal status: 03/31/24 GOAL MET     LONG TERM GOALS: Target date: 05/13/24 Given this patient's Intake score of 86.76 % on the Lymphedema Life Impact Scale (LLIS), patient will experience a reduction of at least 10% in her perceived level of functional impairment resulting from lymphedema to improve functional performance and quality of life (QOL).Baseline: tbd Baseline: max a Goal status: PROGRESSING     2.  With modified independence (extra time and assistive devices) Pt will be able to don and doff appropriate compression garments and/or devices to control BLE lymphedema and to limit progression.  Baseline: Dependent Goal status: PROGRESSING   3. Pt will achieve at least a 10% volume reductions bilaterally below the knees to return limb to more typical size and shape, to limit infection risk and LE progression, to decrease pain, to improve function, and to improve body image and QOL. Baseline: Dependent Goal status:PROGRESSING. Volumetrics reveal R LEG volume DECREASED by 24.3% since commencing OT for CDT on 02/17/24. This value meets and exceeds the initial 10% volume reduction goal for the RLE. Goal elevated to 25% bilaterally.    4. Pt will achieve and sustain at least 85% compliance with all LE self-care home program components throughout  CDT, including modified simple self-MLD, daily skin care and inspection, lymphatic pumping the ex and appropriate compression to limit lymphedema progression and to limit further functional decline. Baseline: Dependent. (Note: W/out daily assistance with donning/ doffing compression  , prognosis is poor.) Goal status:PROGRESSING. Pt hired paid caregiver who assist her with wrapping. Bandaging very well done.   PLAN:  OT FREQUENCY: 2x/week and PRN  OT DURATION: other: 18 weeks and PRN  PLANNED INTERVENTIONS:  Complete Decongestive Therapy (CDT): Manual Lymphatic Drainage (MLD) , Skin care, ther ex, gradient compression 97110-Therapeutic exercises, 97530- Therapeutic activity, 97535- Self Care, [id number]- Manual therapy, Patient/Family education, Manual lymph drainage, Compression bandaging, DME instructions, and trial with advanced, sequential, pneumatic, compression device (Tactile Medical Flexitouch) Pt will arrange for a caregiver to assist her daily with compression wrapping between OT visits.   PLAN FOR NEXT SESSION:  Pt/ caregiver edu Knee length, Multilayer compression wraps to R leg only Complete anatomical measurements to determine OTS or ready-made CircAids  Zebedee Dec, MS, OTR/L, CLT-LANA 05/05/24 3:51 PM' OUTPATIENT OCCUPATIONAL THERAPY TREATMENT NOTE AND PROGRESS REPORT  BILATERAL LOWER EXTREMITY/ BLQ LYMPHEDEMA  Patient Name: Natasha [name] MRN: [id number] DOB:October 23, 1950, 73 y.o., female Today's Date: 05/05/2024  REPORTING PERIOD: 02/12/24 -  04/07/24  END OF SESSION:  OT End of Session - 05/05/24 1527     Visit Number 14    Number of Visits 36    Date for OT Re-Evaluation 05/13/24    OT Start Time 0316    OT Stop Time 0346    OT Time Calculation (min) 30 min    Activity Tolerance Patient tolerated treatment well;No increased pain    Behavior During Therapy [facility] for tasks assessed/performed             Past Medical History:  Diagnosis Date   Abdominal spasms  12/14/2020   Acute pain of right shoulder 01/31/2023   Acute pulmonary embolism (HCC) 08/21/2019   Bacterial conjunctivitis of both eyes 12/14/2020   Bilateral leg edema 02/10/2020   Bradycardia    Breast cancer screening by mammogram 11/24/2020   Candida infection 10/11/2022   CARCINOMA, THYROID  GLAND, PAPILLARY 05/04/2008   Stage 2, 8/09: thyroidectomy for 2.7cm papillary adenocarcinoma (t2 n0 mo) 9/09: I-131 rx, 108 mci 05/10: tg is neg (ab neg) , total body scan is neg   Chronic right-sided low back pain with right-sided sciatica 11/24/2020   Colon cancer screening 11/24/2020   Colon polyps    Complication of anesthesia    trouble with airway   Cough 12/25/2007   Qualifier: Diagnosis of  By: Germaine LPN, Megan     RNCPI-80 08/19/2019   Decreased ROM of neck 01/31/2023   Diverticulosis    DVT (deep venous thrombosis) (HCC)    Edema 12/22/2008   Encounter to establish care 11/24/2020   Groin pain, chronic, right 11/24/2020   Health care maintenance 11/26/2022   Hip pain 12/22/2020   History of 2019 novel coronavirus disease (COVID-19) 11/09/2019   HYPERTENSION 12/25/2007   HYPOTHYROIDISM, POSTSURGICAL 05/04/2008   LEG PAIN, LEFT 12/09/2008   Low TSH level 12/10/2022   Lumbago with sciatica, right side 01/08/2021   Memory loss due to medical condition 11/09/2019   Muscle weakness    Nausea and vomiting 05/23/2008   Qualifier: Diagnosis of  By: Kassie MD, Alyce LABOR   Formatting of this note might be different from the original. Qualifier: Diagnosis of  By: Kassie MD, Sean A   Neck pain 01/31/2023   Non-recurrent acute suppurative otitis media of left ear without spontaneous rupture of tympanic membrane 02/17/2023   Numbness and tingling    Obesity, morbid, BMI 50 or higher (HCC) 08/24/2019   OSA (obstructive sleep apnea) 06/15/2013   CPAP   Paresthesia 01/04/2020   Peripheral edema 12/22/2008   Qualifier: Diagnosis of  By: Delford, MD, CODY Maude Dunnings    Pneumonia due  to COVID-19 virus    Pulmonary embolism (HCC)  Pulmonary embolism (HCC) 08/23/2019   Right lower quadrant pain 11/24/2020   Sciatica of left side 12/04/2011   Skin sensation disturbance 12/05/2008   Qualifier: Diagnosis of  By: Inocencio MD, Berwyn DELENA Deal of this note might be different from the original. Qualifier: Diagnosis of  By: Inocencio MD, Valerie A   Snoring 12/10/2022   Spasm of muscle of lower back 12/14/2020   Upper back pain 01/31/2023   Vaginal candidiasis 03/16/2021   Weakness 01/04/2020   Past Surgical History:  Procedure Laterality Date   ABDOMINAL HYSTERECTOMY     due to bleeding, ovaries remain   ANKLE SURGERY Right    CERVICAL FUSION     x 2   COLONOSCOPY WITH PROPOFOL  N/A 08/08/2015   Procedure: COLONOSCOPY WITH PROPOFOL ;  Surgeon: Renaye Sous, MD;  Location: [facility];  Service: Endoscopy;  Laterality: N/A;   KNEE SURGERY Left    TOTAL THYROIDECTOMY     Patient Active Problem List   Diagnosis Date Noted   Anemia 03/04/2024   Chronic venous insufficiency 09/10/2023   Weakness of both lower extremities 09/10/2023   Atrophic vaginitis 05/14/2023   Pelvic floor dysfunction 05/14/2023   Pancreatic insufficiency 05/14/2023   Arthritis of knee 03/24/2023   At risk for fall due to comorbid condition 03/10/2023   Mood changes 02/17/2023   Recurrent urinary tract infection 01/31/2023   Walker as ambulation aid 01/31/2023   B12 deficiency 11/26/2022   Other fatigue 11/26/2022   Prediabetes 11/26/2022   Obesity, Class III, BMI 40-49.9 (morbid obesity) 11/26/2022   Dermatofibroma 10/11/2022   Fatty liver 04/20/2021   Incontinence of urine in female 04/20/2021   Vitamin D  deficiency 03/16/2021   Constipation 11/24/2020   Gastroesophageal reflux disease 11/24/2020   OAB (overactive bladder) 11/24/2020   Aortic atherosclerosis (HCC) 11/24/2020   Right-sided heart failure (HCC) 11/24/2020   History of pulmonary embolism 02/10/2020   Lymphedema  02/10/2020   Mobility impaired 01/04/2020   Muscle weakness (generalized) 11/09/2019   Pulmonary nodule 11/09/2019   OSA (obstructive sleep apnea) 06/15/2013   Allergic rhinitis 01/21/2009   History of papillary adenocarcinoma of thyroid  05/04/2008   Hypothyroidism, postsurgical 05/04/2008   Essential hypertension 12/25/2007   SOB (shortness of breath) on exertion 12/25/2007    PCP: Camie Doing, NP  REFERRING PROVIDER: Camie Doing, NP  REFERRING DIAG: I89.0  THERAPY DIAG:  Lymphedema, not elsewhere classified  Rationale for Evaluation and Treatment: Rehabilitation  ONSET DATE: >15 years  SUBJECTIVE:  SUBJECTIVE STATEMENT: Ms. Specht presents to OT in manual transport wc for LE care.Pt is accompanied by daughter. Pt is 15 minutes late for a 60 minutes session. Pt has a PT session after OT today.  Pt tells me she has been able wrap daily with assistance from her daughter. Pt states she has been using the compression pump on the LLE, the yet to be treated leg. Pt reports 6/10 knee pain with transfers and increased swelling in the L Leg today.  PERTINENT HISTORY:  Hypothyroidism, postsurgical Essential hypertension Dyspnea OSA (obstructive sleep apnea) Obesity, morbid, BMI 50 or higher (HCC) Muscle weakness (generalized) Gait abnormality History of pulmonary embolism Bilateral lower extremity edema Chronic right-sided low back pain with right-sided sciatica Right-sided heart failure (HCC) Hip pain Incontinence of urine in female Dermatofibroma Morbid obesity (HCC) BMI 50.0-59.9, adult (HCC) SOB (shortness of breath) on exertion Other fatigue Prediabetes Decreased ROM of neck Upper back pain Acute pain of right shoulder Recurrent urinary tract infection Urine frequency Walker as  ambulation aid Ambulatory dysfunction Mood changes At risk for fall due to comorbid condition Arthritis of knee Hypothyroidism Chronic venous insufficiency Mobility impaired Weakness of both lower extremities Gait instability Lymphedema BMI 45.0-49.9, adult (HCC)  PAIN:  Are you having pain? Yes, B knees: NPRS scale: 6/10 Pain location:  Pain description: creaky crackly, tight, heavy, sore, tender. With and without weight bearing Aggravating factors: standing, walking, weight bearing Relieving factors: walking  PRECAUTIONS: Other: LYMPHEDEMA PRECAUTIONS: prediabetic, hypothyroid,  RED FLAGS: Urinary incontinence; numbness and tingling in fingers, hands and toes   WEIGHT BEARING RESTRICTIONS: No  FALLS:  Has patient fallen in last 6 months? No  LIVING ENVIRONMENT: Lives with: lives with their daughter and  granddaughter Lives in: House/apartment Stairs: Yes; Internal: 14 steps; on right going up and External: garage 4 steps steps; can reach both Has following equipment at home: Walker - 4 wheeled, shower chair, and elevated toilet seat  OCCUPATION: retired Careers information officer professor  LEISURE: concerts, writing and producing plays  HAND DOMINANCE: right   PRIOR LEVEL OF FUNCTION: Independent  PATIENT GOALS: walk unassisted; lose weight   OBJECTIVE: Note: Objective measures were completed at Evaluation unless otherwise noted.  COGNITION:  Overall cognitive status: Within functional limits for tasks assessed   OBSERVATIONS / OTHER ASSESSMENTS:   POSTURE: head forward, slight shoulder protraction  LE ROM: Limited at hips knees and ankles 2/2 body habitus and skin approximation  LE MMT: generalized weakness  LYMPHEDEMA ASSESSMENTS:   BLE COMPARATIVE LIMB VOLUMETRICS: Initial 02/17/24  LANDMARK RIGHT   R LEG (A-D) 6530.2 ml  R THIGH (E-G) ml  R FULL LIMB (A-G) ml  Limb Volume differential (LVD)  %  Volume change since initial %  Volume change overall V  (Blank  rows = not tested)  LANDMARK LEFT  L LEG (A-D) 6601.0  L  THIGH (E-G) ml  L  FULL LIMB (A-G) ml  Limb Volume differential (LVD)  1.08% L>R  Volume change since initial %  Volume change overall %  (Blank rows = not tested)    RLE COMPARATIVE LIMB VOLUMETRICS: 9th visit 03/31/24   LANDMARK RIGHT   R LEG (A-D) 4941.4 ml  R THIGH (E-G) ml  R FULL LIMB (A-G) ml  Limb Volume differential (LVD)  %  Volume change since initial R LEG volume DECREASED by 24.3% since commencing OT for CDT on 02/17/24   Volume change overall V  (Blank rows = not tested)    SKIN/TISSUE INTEGRITY: : Moderate,  Stage  II, Bilateral Lower Extremity Lymphedema 2/2 CVI,  Obesity, and Mm weakness  Skin  Description Hyper-Keratosis Peau d' Orange Shiny Tight Fibrotic/ Indurated Fatty Hard Spongy/ boggy   x   x R>L x x x   Skin dry Flaky WNL Macerated   mildly      Color Redness Varicosities Blanching Hemosiderin Stain Mottled        x   Odor Malodorous Yeast Fungal infection  WNL      x   Temperature Warm Cool wnl    x     Pitting Edema   1+ 2+ 3+ 4+ Non-pitting   x         Girth Symmetrical Asymmetrical                   Distribution    R>L toes to groin, bilaterally    Stemmer Sign Positive Negative   +    Lymphorrhea History Of:  Present Absent     x    Wounds History Of Present Absent Venous Arterial Pressure Sheer   denies  x        Signs of Infection Redness Warmth Erythema Acute Swelling Drainage Borders                    Sensation Light Touch Deep pressure Hypersensitivity   In tact Impaired In tact Impaired Absent Impaired   x  x  x     Nails WNL   Fungus nail dystrophy   TBA     Hair Growth Symmetrical Asymmetrical   TB Ax    Skin Creases Base of toes  Ankles   Base of Fingers knees       Abdominal pannus Thigh Lobules  Face/neck   x x  x       GAIT: Pt transported to clinic in transport wheelchair Distance walked: Pt able to transfer out of wc and  walk 4-5 steps using 2 wheeled walker to Rx bed with extra time (modified independent).  Assistive device utilized: Pt able to lift legs onto Rx bed, one at a time, using hands to actively assist Level of assistance: Modified independence Comments: Transfers  and bed mobility take an inordinate amount of time  LYMPHEDEMA LIFE IMPACT SCALE (LLIS): Initial: 86.76% (The extent to which LE-related problems impacted your life over the past week.)                                                                                                                           TREATMENT THIS DATE: RLE MLD Compression wrapping Pt/ CG edu   PATIENT EDUCATION:  Continued Pt/ CG edu for lymphedema self care home program throughout session. Topics include outcome of comparative limb volumetrics- starting limb volume differentials (LVDs), technology and gradient techniques used for short stretch, multilayer compression wrapping, simple self-MLD, therapeutic lymphatic pumping exercises, skin/nail care, LE precautions, compression garment recommendations and specifications, wear and care schedule and  compression garment donning / doffing w assistive devices. Discussed progress towards all OT goals since commencing CDT. Discussed detrimental impact of obesity on lower and upper extremity lymphedema over time. Reviewed OT goals for lymphedema care with Pt and discussed progress to date.  All questions answered to the Pt's satisfaction. Good return. Person educated: Patient and family Education method: Explanation, Demonstration, and Handouts Education comprehension: verbalized understanding, returned demonstration, verbal cues required, and needs further education  LYMPHEDEMA SELF-CARE HOME PROGRAM: BLE lymphatic pumping there ex- 1 set of 10 each element, in order. Hold 5. 2 x daily 2. Daily, short stretch, thigh length, multilayer compression bandages during Intensive Phase CDT 3. During self-Management Phase fit  with appropriate compression garments and/ or devices 3. Daily skin care with low ph lotion matching skin ph 4. Daily simple self MLD   ASSESSMENT:  CLINICAL IMPRESSION:Pt continues to make slow but steady progress towards OT goals for lymphedema care. Please refer to GOALS section below for detailed report on each goal. To date R LEG volume is DECREASED by 24.3% since commencing OT for CDT on 02/17/24. This value meets and exceeds the initial 10% volume reduction goal for the RLE. Goal elevated to 25% bilaterally. Applied R LE gradient compression wraps. We were unable to finalize measurements and specifications for CircAids today due to time constraints, but we were able to determine that Pt does fit into a ready to wear size. We'll submit measurements to DME vendor after next session when we complete R leg measurements. We'll shift Rx to LLE after RLE garment is fitted. LLE remains very swollen, dense and hard to palpation.   Pt is ready to be fit with custom RLE , knee length, adjustable, Velcro wrap-style compression legging, or CircAid Juxtafit Essential. The LLE treatment and fitting will commence after RLE garment fitting is complete. The CircAid legging is needed to enable patient to don and doff the legging with the highest level of independence possible. It is also caregiver friendly. This garment is preferred because it is the only manufactured adjustable garment with a built in gauging system that enables Pt's to see when they have applied correct amount of containment and compression to control their lymphedema.   Fit with: 4 total quantity- (2 RLE and @ LLE) Velcro wrap-style compression legging, CircAid Juxtafit Essential lower leg  ( one to wash and one to wear) 2 quantity (1 R and 1 Left)  Mediven CircAid comfort accessory kit, LARGE, containing compression anklets, leg liners (under socks) , 1 cover up and Pac bands to control foot swelling  Custom-made gradient compression garments   are medically necessary because they are uniquely sized and shaped to fit the exact dimensions of the affected extremities, and to provide appropriate medical grade, graduated compression essential for optimally managing chronic, progressive lymphedema. Multiple custom compression garments are needed to ensure proper hygiene to limit infection risk. Custom compression garments should be replaced q 3-6 months when worn consistently for optimal lymphedema self-management over time they will wear out and require replacement. HOS devices, medically necessary to limit fibrosis buildup in tissue, should be replaced q 2 years and PRN when worn out.   Cont as per POC.  (02/12/24 Initial OT Eval : OCTIVIA CANION is a 73 yo female presenting with moderate, stage  II, bilateral lower extremity lymphedema 2/2 CVI,  obesity, and mm weakness. She will benefit from skilled Occupational Therapy to reduce limb swelling and associated pain, to limit progression and infection risk. BLE lymphedema  limits functional performance in all occupational domains, including functional mobility and ambulation,  basic and instrumental ADLs, productive activities, leisure pursuits, social participation and quality of life. This patient will benefit from modified Intensive and Self Management Phase CDT . In addition to MLD, ther ex, skin care and compression bandaging below the knees, Pt will be fitted with custom knee length compression stockings paired with off the shelf , Capri length compression leggings. If insurance benefits allow, she may undergo a trial in the clinic with the advanced Flexitouch device in an effort to reduce discomfort and reduce infection risk. Without skilled OT Li-lymphedema will worsen over time and further functional decline is expected.    Ms Bissette is unable to reach feet and distal legs to apply multilayer , gradient compression wraps during the Intensive Phase of CDT.  She understands this limitation and  agrees to  arrange for a caregiver to assist her daily with compression wrapping between OT visits. With a caregiver to assist her with Intensive Phase compression her prognosis is fair. Without CG assistance her prognosis is poor.)  OBJECTIVE IMPAIRMENTS: decreased activity tolerance, decreased balance, decreased endurance, decreased knowledge of condition, decreased knowledge of use of DME, decreased mobility, difficulty walking, decreased ROM, decreased strength, increased edema, impaired UE functional use, postural dysfunction, obesity, pain, and chronic leg swelling.   ACTIVITY LIMITATIONS: ACTIVITY LIMITATIONS: Mobility and functional ambulation limitations:  abnormal gait pattern, difficulty walking, carrying, lifting, bending, sitting, standing, squatting, stairs, transfers, and bed mobility Basic and instrumental ADLs (reaching feet and distal legs to groom nails, inspect skin, apply lotion, bathe lower body, difficulty with LB dressing, including fitting LB clothing, shoes and socks, impaired sleeping, meal prep, standing to cook, driving, shopping, yard work, house work Paediatric nurse activities: work related activities requiring extended standing, walking, sitting; caring for others Social participation in the community, socializing with others, LE affects body image  Leisure pursuits requiring extended standing, walking, sitting  PERSONAL FACTORS: Fitness, Time since onset of injury/illness/exacerbation, and 3+ comorbidities: Obesity, arthritis, OSA are also affecting patient's functional outcome.   REHAB POTENTIAL: Fair with daily caregiver assistance with gradient compression wrapping. Without assistance prognosis is poor as Pt is unable to apply wraps independently  EVALUATION COMPLEXITY: Moderate   GOALS: Goals reviewed with patient? Yes   SHORT TERM GOALS: Target date: 4th OT Rx visit  Pt will demonstrate understanding of lymphedema precautions and prevention strategies with  modified independence using a printed reference to identify at least 5 precautions and discussing how s/he may implement them into daily life to reduce risk of progression with modified assistance Baseline: max a Goal status:PROGRESSING   2.  With Max caregiver assistance Pt will be able to apply multilayer, knee length, compression wraps using gradient techniques to decrease limb volume, to limit infection risk, and to limit lymphedema progression.  Given this patient's Intake score of tbd % on the Lymphedema Life Impact Scale (LLIS), patient will experience a reduction of at least 5% in her perceived level of functional impairment resulting from lymphedema to improve functional performance and quality of life (QOL). Baseline: Dependent Goal status: 03/31/24 GOAL MET     LONG TERM GOALS: Target date: 05/13/24 Given this patient's Intake score of 86.76 % on the Lymphedema Life Impact Scale (LLIS), patient will experience a reduction of at least 10% in her perceived level of functional impairment resulting from lymphedema to improve functional performance and quality of life (QOL).Baseline: tbd Baseline: max a Goal status: PROGRESSING  2.  With modified independence (extra time and assistive devices) Pt will be able to don and doff appropriate compression garments and/or devices to control BLE lymphedema and to limit progression.  Baseline: Dependent Goal status: PROGRESSING   3. Pt will achieve at least a 10% volume reductions bilaterally below the knees to return limb to more typical size and shape, to limit infection risk and LE progression, to decrease pain, to improve function, and to improve body image and QOL. Baseline: Dependent Goal status:PROGRESSING. Volumetrics reveal R LEG volume DECREASED by 24.3% since commencing OT for CDT on 02/17/24. This value meets and exceeds the initial 10% volume reduction goal for the RLE. Goal elevated to 25% bilaterally.    4. Pt will achieve and  sustain at least 85% compliance with all LE self-care home program components throughout CDT, including modified simple self-MLD, daily skin care and inspection, lymphatic pumping the ex and appropriate compression to limit lymphedema progression and to limit further functional decline. Baseline: Dependent. (Note: W/out daily assistance with donning/ doffing compression  , prognosis is poor.) Goal status:PROGRESSING. Pt hired paid caregiver who assist her with wrapping. Bandaging very well done.   PLAN:  OT FREQUENCY: 2x/week and PRN  OT DURATION: other: 18 weeks and PRN  PLANNED INTERVENTIONS:  Complete Decongestive Therapy (CDT): Manual Lymphatic Drainage (MLD) , Skin care, ther ex, gradient compression 97110-Therapeutic exercises, 97530- Therapeutic activity, 97535- Self Care, [id number]- Manual therapy, Patient/Family education, Manual lymph drainage, Compression bandaging, DME instructions, and trial with advanced, sequential, pneumatic, compression device (Tactile Medical Flexitouch) Pt will arrange for a caregiver to assist her daily with compression wrapping between OT visits.   PLAN FOR NEXT SESSION:  RLE MLD w simultaneous skin care Pt/ caregiver edu Knee length, Multilayer compression wraps to R leg only   Zebedee Dec, MS, OTR/L, CLT-LANA 05/05/24 3:51 PM'

## 2024-05-07 ENCOUNTER — Other Ambulatory Visit: Payer: Self-pay

## 2024-05-07 ENCOUNTER — Telehealth: Payer: Self-pay

## 2024-05-07 DIAGNOSIS — I872 Venous insufficiency (chronic) (peripheral): Secondary | ICD-10-CM

## 2024-05-07 DIAGNOSIS — I890 Lymphedema, not elsewhere classified: Secondary | ICD-10-CM

## 2024-05-07 DIAGNOSIS — I50812 Chronic right heart failure: Secondary | ICD-10-CM

## 2024-05-07 DIAGNOSIS — E559 Vitamin D deficiency, unspecified: Secondary | ICD-10-CM

## 2024-05-07 NOTE — Telephone Encounter (Signed)
 Can you guys keep an eye out for this and put in Sara's folder. Thanks.   Copied from CRM [id number]. Topic: Clinical - Medical Advice >> May 07, 2024 11:48 AM Amy B wrote: Reason for CRM: [employer] is sending paperwork for physician signature to supply patient with custom made compression garment.

## 2024-05-07 NOTE — Patient Outreach (Signed)
 Complex Care Management   Visit Note  05/07/2024  Name:  Natasha [name] MRN: [id number] DOB: 05-01-1951  Situation: Referral received for Complex Care Management related to Right-Sided Hearted Failure, Chronic Venous Insufficiency, Pelvic Floor dysfunction, Overactive Bladder, recurrent UTI, Obesity, Class III, BMI 40-49.9 (morbid obesity), history of Pulmonary Embolism, Bilateral lymphedema to LE, OSA, Weakness of both lower extremities, Arthritis to both knees. I obtained verbal consent from Patient.  Visit completed with Patient  on the phone.  Background:   Past Medical History:  Diagnosis Date   Abdominal spasms 12/14/2020   Acute pain of right shoulder 01/31/2023   Acute pulmonary embolism (HCC) 08/21/2019   Bacterial conjunctivitis of both eyes 12/14/2020   Bilateral leg edema 02/10/2020   Bradycardia    Breast cancer screening by mammogram 11/24/2020   Candida infection 10/11/2022   CARCINOMA, THYROID  GLAND, PAPILLARY 05/04/2008   Stage 2, 8/09: thyroidectomy for 2.7cm papillary adenocarcinoma (t2 n0 mo) 9/09: I-131 rx, 108 [facility] 05/10: tg is neg (ab neg) , total body scan is neg   Chronic right-sided low back pain with right-sided sciatica 11/24/2020   Colon cancer screening 11/24/2020   Colon polyps    Complication of anesthesia    trouble with airway   Cough 12/25/2007   Qualifier: Diagnosis of  By: Germaine LPN, Megan     RNCPI-80 08/19/2019   Decreased ROM of neck 01/31/2023   Diverticulosis    DVT (deep venous thrombosis) (HCC)    Edema 12/22/2008   Encounter to establish care 11/24/2020   Groin pain, chronic, right 11/24/2020   Health care maintenance 11/26/2022   Hip pain 12/22/2020   History of 2019 novel coronavirus disease (COVID-19) 11/09/2019   HYPERTENSION 12/25/2007   HYPOTHYROIDISM, POSTSURGICAL 05/04/2008   LEG PAIN, LEFT 12/09/2008   Low TSH level 12/10/2022   Lumbago with sciatica, right side 01/08/2021   Memory loss due to medical condition  11/09/2019   Muscle weakness    Nausea and vomiting 05/23/2008   Qualifier: Diagnosis of  By: Kassie MD, Alyce LABOR   Formatting of this note might be different from the original. Qualifier: Diagnosis of  By: Kassie MD, Sean A   Neck pain 01/31/2023   Non-recurrent acute suppurative otitis media of left ear without spontaneous rupture of tympanic membrane 02/17/2023   Numbness and tingling    Obesity, morbid, BMI 50 or higher (HCC) 08/24/2019   OSA (obstructive sleep apnea) 06/15/2013   CPAP   Paresthesia 01/04/2020   Peripheral edema 12/22/2008   Qualifier: Diagnosis of  By: Delford, MD, CODY Maude Dunnings    Pneumonia due to COVID-19 virus    Pulmonary embolism (HCC)    Pulmonary embolism (HCC) 08/23/2019   Right lower quadrant pain 11/24/2020   Sciatica of left side 12/04/2011   Skin sensation disturbance 12/05/2008   Qualifier: Diagnosis of  By: Inocencio MD, [place] LABOR Deal of this note might be different from the original. Qualifier: Diagnosis of  By: Inocencio MD, Berwyn LABOR   Snoring 12/10/2022   Spasm of muscle of lower back 12/14/2020   Upper back pain 01/31/2023   Vaginal candidiasis 03/16/2021   Weakness 01/04/2020    Assessment: Patient Reported Symptoms:  Cognitive Cognitive Status: Normal speech and language skills, Alert and oriented to person, place, and time Cognitive/Intellectual Conditions Management [RPT]: None reported or documented in medical history or problem list   Health Maintenance Behaviors: Annual physical exam, Healthy diet, Immunizations Healing Pattern: Slow Health Facilitated by: Pain control,  Rest, Healthy diet  Neurological Neurological Review of Symptoms: Numbness Neurological Management Strategies: Routine screening Neurological Self-Management Outcome: 3 (uncertain) Neurological Comment: numbness to both feet/toes  HEENT HEENT Symptoms Reported: Sudden change or loss of vision HEENT Management Strategies: Routine screening HEENT  Self-Management Outcome: 3 (uncertain) HEENT Comment: dx: with macular degeneration; Cataracts    Cardiovascular Cardiovascular Symptoms Reported: Swelling in legs or feet Does patient have uncontrolled Hypertension?: No Cardiovascular Management Strategies: Adequate rest, Medication therapy, Routine screening Cardiovascular Self-Management Outcome: 4 (good)  Respiratory Respiratory Symptoms Reported: Shortness of breath Respiratory Management Strategies: Routine screening, CPAP Respiratory Self-Management Outcome: 3 (uncertain)  Endocrine Endocrine Symptoms Reported: No symptoms reported Is patient diabetic?: No    Gastrointestinal Gastrointestinal Symptoms Reported: No symptoms reported      Genitourinary Genitourinary Symptoms Reported: Frequency, Incontinence Genitourinary Management Strategies: Incontinence garment/pad, Fluid modification Genitourinary Self-Management Outcome: 2 (bad)  Integumentary Integumentary Symptoms Reported: Other, Rash, Itching Other Integumentary Symptoms: lymphedema to bilateral legs; rash under breast Skin Management Strategies: Routine screening Skin Self-Management Outcome: 3 (uncertain)  Musculoskeletal Musculoskelatal Symptoms Reviewed: Limited mobility, Difficulty walking, Unsteady gait, Muscle pain, Weakness Additional Musculoskeletal Details: using a walker for mobility Musculoskeletal Management Strategies: Medical device, Routine screening, Adequate rest Musculoskeletal Self-Management Outcome: 3 (uncertain) Falls in the past year?: No Number of falls in past year: 1 or less Was there an injury with Fall?: No Fall Risk Category Calculator: 0 Patient Fall Risk Level: Low Fall Risk Patient at Risk for Falls Due to: Impaired balance/gait, Impaired mobility Fall risk Follow up: Falls evaluation completed  Psychosocial Psychosocial Symptoms Reported: No symptoms reported   Major Change/Loss/Stressor/Fears (CP): Medical condition,  self Techniques to Cope with Loss/Stress/Change: None (referral placed with BSW for resources and support) Quality of Family Relationships: supportive Do you feel physically threatened by others?: No    05/07/2024    PHQ2-9 Depression Screening   Ginelle Bays interest or pleasure in doing things    Feeling down, depressed, or hopeless    PHQ-2 - Total Score    Trouble falling or staying asleep, or sleeping too much    Feeling tired or having Waunita Sandstrom energy    Poor appetite or overeating     Feeling bad about yourself - or that you are a failure or have let yourself or your family down    Trouble concentrating on things, such as reading the newspaper or watching television    Moving or speaking so slowly that other people could have noticed.  Or the opposite - being so fidgety or restless that you have been moving around a lot more than usual    Thoughts that you would be better off dead, or hurting yourself in some way    PHQ2-9 Total Score    If you checked off any problems, how difficult have these problems made it for you to do your work, take care of things at home, or get along with other people    Depression Interventions/Treatment      There were no vitals filed for this visit.  Medications Reviewed Today     Reviewed by Morgan Clayborne CROME, RN (Registered Nurse) on 05/07/24 at 1306  Med List Status: <None>   Medication Order Taking? Sig Documenting Provider Last Dose Status Informant  acetaminophen  (TYLENOL ) 500 MG tablet [id number]  Take 1 tablet (500 mg total) by mouth every 6 (six) hours as needed.  Patient not taking: Reported on 05/07/2024   Charlyn Sora, MD  Active  Med Note DELILA, ADAM D   Thu Mar 13, 2023  3:16 PM) Prn   acetaminophen  (TYLENOL ) 650 MG CR tablet [id number] Yes Take 650 mg by mouth every 8 (eight) hours as needed for pain.  Patient taking differently: Take 650 mg by mouth every 8 (eight) hours as needed for pain. Patient is taking 1 tablet twice daily    [provider]  Active   AMBULATORY NON FORMULARY MEDICATION [id number]  Motorized Scooter based on insurance coverage. Early, Sara E, NP  Active   cephALEXin  (KEFLEX ) 500 MG capsule [id number]  Take 1 capsule (500 mg total) by mouth 3 (three) times daily.  Patient not taking: Reported on 05/07/2024   EarlyCamie BRAVO, NP  Active   Elastic Bandages & Supports (MEDICAL COMPRESSION STOCKINGS) MISC [id number]  Thigh high compression stockings for venous insufficiency and lymphedema. Please measure ankle, calf, and thigh circumference and leg length for fit. Brand per insurance. Requesting one pair. Early, Sara E, NP  Active   Elastic Bandages & Supports (MEDICAL COMPRESSION STOCKINGS) MISC [id number]  30-40 mmHg knee length compression stockings for venous insufficiency and lymphedema. Please measure ankle and calf circumference and leg length for fit. Brand per insurance coverage. Requesting 3 pair. Early, Sara E, NP  Active   furosemide  (LASIX ) 20 MG tablet [id number] Yes Take 1 tablet (20 mg total) by mouth 2 (two) times daily.  Patient taking differently: Take 20 mg by mouth 2 (two) times daily. Patient is taking as needed   Tysinger, David S, PA-C  Active   levothyroxine  (SYNTHROID ) 125 MCG tablet [id number] Yes Take 1 tablet (125 mcg total) by mouth daily. Early, Sara E, NP  Active   Multiple Vitamin (MULTIVITAMIN WITH MINERALS) TABS tablet [id number] Yes Take 1 tablet by mouth daily. [provider]  Active Self  potassium chloride  (KLOR-CON ) 10 MEQ tablet [id number]  Take 1 tablet (10 mEq total) by mouth 2 (two) times daily.  Patient not taking: Reported on 05/07/2024   Bulah Alm RAMAN, PA-C  Active   rivaroxaban  (XARELTO ) 20 MG TABS tablet [id number] Yes Take 1 tablet (20 mg total) by mouth daily with supper. Early, Sara E, NP  Active   valsartan  (DIOVAN ) 160 MG tablet [id number] Yes TAKE 1 TABLET BY MOUTH EVERY DAY Early, Sara E, NP  Active   Vitamin D , Ergocalciferol , (DRISDOL ) 1.25 MG  (50000 UNIT) CAPS capsule [id number]  Take 1 capsule (50,000 Units total) by mouth every 7 (seven) days.  Patient not taking: Reported on 05/07/2024   Oris Camie BRAVO, NP  Active             Recommendation:   Referral to: [facility]  Continue Current Plan of Care  Follow Up Plan:   Telephone follow up appointment date/time:  Tuesday, September 9 at 11:00 AM Referral to [facility] Rechel Delosreyes RN BSN CCM [place]  [facility], [facility] Nurse Care Coordinator  Direct Dial: [phone] Website: [email or web address]

## 2024-05-10 ENCOUNTER — Telehealth: Payer: Self-pay

## 2024-05-10 ENCOUNTER — Ambulatory Visit: Admitting: Occupational Therapy

## 2024-05-10 ENCOUNTER — Ambulatory Visit: Admitting: Physical Therapy

## 2024-05-10 NOTE — Progress Notes (Signed)
 Care Guide Pharmacy Note  05/10/2024 Name: Natasha [name] MRN: [id number] DOB: 1951-06-29  Referred By: Oris Camie BRAVO, NP Reason for referral: Complex Care Management and Call Attempt #1 (Successful initial outreach scheduled with [facility] D- Jon )   Natasha [name] is a 73 y.o. year old female who is a primary care patient of Early, Camie BRAVO, NP.  Natasha [name] was referred to the pharmacist for assistance related to: Polypharmacy  Successful contact was made with the patient to discuss pharmacy services including being ready for the pharmacist to call at least 5 minutes before the scheduled appointment time and to have medication bottles and any blood pressure readings ready for review. The patient agreed to meet with the pharmacist via telephone visit on (date/time). 06/03/24 @ 11 AM  Leotis Cloria Davene Salome Romell Ralph Valrie, [facility] Guide  Direct Dial: [phone]  Fax [phone]

## 2024-05-10 NOTE — Progress Notes (Signed)
 Complex Care Management Note Care Guide Note  05/10/2024 Name: Natasha [name] MRN: [id number] DOB: August 23, 1951   Complex Care Management Outreach Attempts: An unsuccessful telephone outreach was attempted today to offer the patient information about available complex care management services.  Follow Up Plan:  Additional outreach attempts will be made to offer the patient complex care management information and services.   Encounter Outcome:  No Answer  Ahmani Daoud [facility]  [facility] Guide Direct Dial: [phone]  Fax: [phone] Website: [email or web address]

## 2024-05-11 ENCOUNTER — Other Ambulatory Visit: Payer: Self-pay | Admitting: Licensed Clinical Social Worker

## 2024-05-11 DIAGNOSIS — M170 Bilateral primary osteoarthritis of knee: Secondary | ICD-10-CM | POA: Diagnosis not present

## 2024-05-11 DIAGNOSIS — R262 Difficulty in walking, not elsewhere classified: Secondary | ICD-10-CM | POA: Diagnosis not present

## 2024-05-11 DIAGNOSIS — M25561 Pain in right knee: Secondary | ICD-10-CM | POA: Diagnosis not present

## 2024-05-11 DIAGNOSIS — M25562 Pain in left knee: Secondary | ICD-10-CM | POA: Diagnosis not present

## 2024-05-12 ENCOUNTER — Ambulatory Visit: Admitting: Occupational Therapy

## 2024-05-12 ENCOUNTER — Ambulatory Visit: Admitting: Physical Therapy

## 2024-05-12 DIAGNOSIS — M25561 Pain in right knee: Secondary | ICD-10-CM | POA: Diagnosis not present

## 2024-05-12 DIAGNOSIS — M25562 Pain in left knee: Secondary | ICD-10-CM | POA: Diagnosis not present

## 2024-05-12 DIAGNOSIS — R2689 Other abnormalities of gait and mobility: Secondary | ICD-10-CM | POA: Diagnosis not present

## 2024-05-12 DIAGNOSIS — G8929 Other chronic pain: Secondary | ICD-10-CM | POA: Diagnosis not present

## 2024-05-12 DIAGNOSIS — R262 Difficulty in walking, not elsewhere classified: Secondary | ICD-10-CM | POA: Diagnosis not present

## 2024-05-12 DIAGNOSIS — I890 Lymphedema, not elsewhere classified: Secondary | ICD-10-CM | POA: Diagnosis not present

## 2024-05-12 DIAGNOSIS — R260 Ataxic gait: Secondary | ICD-10-CM | POA: Diagnosis not present

## 2024-05-12 DIAGNOSIS — M6281 Muscle weakness (generalized): Secondary | ICD-10-CM | POA: Diagnosis not present

## 2024-05-13 ENCOUNTER — Telehealth: Payer: Self-pay

## 2024-05-13 ENCOUNTER — Encounter: Payer: Self-pay | Admitting: Occupational Therapy

## 2024-05-13 DIAGNOSIS — F411 Generalized anxiety disorder: Secondary | ICD-10-CM | POA: Diagnosis not present

## 2024-05-13 NOTE — Progress Notes (Signed)
 Complex Care Management Note Care Guide Note  05/13/2024 Name: Natasha [name] MRN: [id number] DOB: 1951-04-24   Complex Care Management Outreach Attempts: A second unsuccessful outreach was attempted today to offer the patient with information about available complex care management services.  Follow Up Plan:  Additional outreach attempts will be made to offer the patient complex care management information and services.   Encounter Outcome:  No Answer  Neesha Langton [facility]  [facility] Guide Direct Dial: [phone]  Fax: [phone] Website: [email or web address]

## 2024-05-13 NOTE — Therapy (Signed)
 OUTPATIENT OCCUPATIONAL THERAPY TREATMENT NOTE   BILATERAL LOWER EXTREMITY/ BLQ LYMPHEDEMA  Patient Name: Natasha [name] MRN: [id number] DOB:02/16/1951, 73 y.o., female Today's Date: 05/13/2024  REPORTING PERIOD:  END OF SESSION:  OT End of Session - 05/12/24 1509     Visit Number 15    Number of Visits 36    Date for OT Re-Evaluation 05/13/24    OT Start Time 0305    OT Stop Time 0400    OT Time Calculation (min) 55 min    Activity Tolerance Patient tolerated treatment well;No increased pain    Behavior During Therapy [facility] for tasks assessed/performed             Past Medical History:  Diagnosis Date   Abdominal spasms 12/14/2020   Acute pain of right shoulder 01/31/2023   Acute pulmonary embolism (HCC) 08/21/2019   Bacterial conjunctivitis of both eyes 12/14/2020   Bilateral leg edema 02/10/2020   Bradycardia    Breast cancer screening by mammogram 11/24/2020   Candida infection 10/11/2022   CARCINOMA, THYROID  GLAND, PAPILLARY 05/04/2008   Stage 2, 8/09: thyroidectomy for 2.7cm papillary adenocarcinoma (t2 n0 mo) 9/09: I-131 rx, 108 mci 05/10: tg is neg (ab neg) , total body scan is neg   Chronic right-sided low back pain with right-sided sciatica 11/24/2020   Colon cancer screening 11/24/2020   Colon polyps    Complication of anesthesia    trouble with airway   Cough 12/25/2007   Qualifier: Diagnosis of  By: Germaine LPN, Megan     RNCPI-80 08/19/2019   Decreased ROM of neck 01/31/2023   Diverticulosis    DVT (deep venous thrombosis) (HCC)    Edema 12/22/2008   Encounter to establish care 11/24/2020   Groin pain, chronic, right 11/24/2020   Health care maintenance 11/26/2022   Hip pain 12/22/2020   History of 2019 novel coronavirus disease (COVID-19) 11/09/2019   HYPERTENSION 12/25/2007   HYPOTHYROIDISM, POSTSURGICAL 05/04/2008   LEG PAIN, LEFT 12/09/2008   Low TSH level 12/10/2022   Lumbago with sciatica, right side 01/08/2021   Memory loss due to  medical condition 11/09/2019   Muscle weakness    Nausea and vomiting 05/23/2008   Qualifier: Diagnosis of  By: Kassie MD, Alyce LABOR   Formatting of this note might be different from the original. Qualifier: Diagnosis of  By: Kassie MD, Sean A   Neck pain 01/31/2023   Non-recurrent acute suppurative otitis media of left ear without spontaneous rupture of tympanic membrane 02/17/2023   Numbness and tingling    Obesity, morbid, BMI 50 or higher (HCC) 08/24/2019   OSA (obstructive sleep apnea) 06/15/2013   CPAP   Paresthesia 01/04/2020   Peripheral edema 12/22/2008   Qualifier: Diagnosis of  By: Delford, MD, CODY Maude Dunnings    Pneumonia due to COVID-19 virus    Pulmonary embolism (HCC)    Pulmonary embolism (HCC) 08/23/2019   Right lower quadrant pain 11/24/2020   Sciatica of left side 12/04/2011   Skin sensation disturbance 12/05/2008   Qualifier: Diagnosis of  By: Inocencio MD, Berwyn LABOR Deal of this note might be different from the original. Qualifier: Diagnosis of  By: Inocencio MD, Valerie A   Snoring 12/10/2022   Spasm of muscle of lower back 12/14/2020   Upper back pain 01/31/2023   Vaginal candidiasis 03/16/2021   Weakness 01/04/2020   Past Surgical History:  Procedure Laterality Date   ABDOMINAL HYSTERECTOMY     due to bleeding, ovaries remain  ANKLE SURGERY Right    CERVICAL FUSION     x 2   COLONOSCOPY WITH PROPOFOL  N/A 08/08/2015   Procedure: COLONOSCOPY WITH PROPOFOL ;  Surgeon: Renaye Sous, MD;  Location: [facility];  Service: Endoscopy;  Laterality: N/A;   KNEE SURGERY Left    TOTAL THYROIDECTOMY     Patient Active Problem List   Diagnosis Date Noted   Anemia 03/04/2024   Chronic venous insufficiency 09/10/2023   Weakness of both lower extremities 09/10/2023   Atrophic vaginitis 05/14/2023   Pelvic floor dysfunction 05/14/2023   Pancreatic insufficiency 05/14/2023   Arthritis of knee 03/24/2023   At risk for fall due to comorbid condition  03/10/2023   Mood changes 02/17/2023   Recurrent urinary tract infection 01/31/2023   Walker as ambulation aid 01/31/2023   B12 deficiency 11/26/2022   Other fatigue 11/26/2022   Prediabetes 11/26/2022   Obesity, Class III, BMI 40-49.9 (morbid obesity) 11/26/2022   Dermatofibroma 10/11/2022   Fatty liver 04/20/2021   Incontinence of urine in female 04/20/2021   Vitamin D  deficiency 03/16/2021   Constipation 11/24/2020   Gastroesophageal reflux disease 11/24/2020   OAB (overactive bladder) 11/24/2020   Aortic atherosclerosis (HCC) 11/24/2020   Right-sided heart failure (HCC) 11/24/2020   History of pulmonary embolism 02/10/2020   Lymphedema 02/10/2020   Mobility impaired 01/04/2020   Muscle weakness (generalized) 11/09/2019   Pulmonary nodule 11/09/2019   OSA (obstructive sleep apnea) 06/15/2013   Allergic rhinitis 01/21/2009   History of papillary adenocarcinoma of thyroid  05/04/2008   Hypothyroidism, postsurgical 05/04/2008   Essential hypertension 12/25/2007   SOB (shortness of breath) on exertion 12/25/2007    PCP: Camie Doing, NP  REFERRING PROVIDER: Camie Doing, NP  REFERRING DIAG: I89.0  THERAPY DIAG:  Lymphedema, not elsewhere classified  Rationale for Evaluation and Treatment: Rehabilitation  ONSET DATE: >15 years  SUBJECTIVE:                                                                                                                                                                                          SUBJECTIVE STATEMENT: Natasha [name] presents to OT in manual transport wc for LE care. Pt is accompanied by her granddaughter and her friend. Pt is 1on time for our 60 minute session. Pt reports she had 4th of 4 knee injections, but has had limited relief  from knee pain and swelling to date. Pt reports knee pain today but does not rate it numerically. She does appear more mobile today as evidenced by more frequent repositioning in her wheelchair without wincing  or grimacing as usual.  Again we are providing manual therapy with Pt seated  in transport wc and leg supported on footstool to limit transfer time and pain. Pt had email from DME vendor received  her garment order and is working on submitting her paperwork. OT encouraged Pt to call referring provider's office and let them know it's on the way and to please sign and return to vendor ASAP so process is not delayed.  PERTINENT HISTORY:  Hypothyroidism, postsurgical Essential hypertension Dyspnea OSA (obstructive sleep apnea) Obesity, morbid, BMI 50 or higher (HCC) Muscle weakness (generalized) Gait abnormality History of pulmonary embolism Bilateral lower extremity edema Chronic right-sided low back pain with right-sided sciatica Right-sided heart failure (HCC) Hip pain Incontinence of urine in female Dermatofibroma Morbid obesity (HCC) BMI 50.0-59.9, adult (HCC) SOB (shortness of breath) on exertion Other fatigue Prediabetes Decreased ROM of neck Upper back pain Acute pain of right shoulder Recurrent urinary tract infection Urine frequency Walker as ambulation aid Ambulatory dysfunction Mood changes At risk for fall due to comorbid condition Arthritis of knee Hypothyroidism Chronic venous insufficiency Mobility impaired Weakness of both lower extremities Gait instability Lymphedema BMI 45.0-49.9, adult (HCC)  PAIN:  Are you having pain? Yes, B knees: NPRS scale: /10 Pain location: seated and at rest Pain description: creaky crackly, tight, heavy, sore, tender. With and without weight bearing Aggravating factors: standing, walking, weight bearing Relieving factors: walking  PRECAUTIONS: Other: LYMPHEDEMA PRECAUTIONS: prediabetic, hypothyroid, Fall Risk  RED FLAGS: Urinary incontinence; numbness and tingling in fingers, hands and toes   WEIGHT BEARING RESTRICTIONS: No  FALLS:  Has patient fallen in last 6 months? No  LIVING ENVIRONMENT: Lives with: lives with  their daughter and  granddaughter Lives in: House/apartment Stairs: Yes; Internal: 14 steps; on right going up and External: garage 4 steps steps; can reach both Has following equipment at home: Walker - 4 wheeled, shower chair, and elevated toilet seat  OCCUPATION: retired Careers information officer professor  LEISURE: concerts, writing and producing plays  HAND DOMINANCE: right   PRIOR LEVEL OF FUNCTION: Independent  PATIENT GOALS: walk unassisted; lose weight   OBJECTIVE: Note: Objective measures were completed at Evaluation unless otherwise noted.  COGNITION:  Overall cognitive status: Within functional limits for tasks assessed   OBSERVATIONS / OTHER ASSESSMENTS:   POSTURE: head forward, slight shoulder protraction  LE ROM: Limited at hips knees and ankles 2/2 body habitus and skin approximation  LE MMT: generalized weakness  LYMPHEDEMA ASSESSMENTS:   BLE COMPARATIVE LIMB VOLUMETRICS: Initial 02/17/24  LANDMARK RIGHT   R LEG (A-D) 6530.2 ml  R THIGH (E-G) ml  R FULL LIMB (A-G) ml  Limb Volume differential (LVD)  %  Volume change since initial %  Volume change overall V  (Blank rows = not tested)  LANDMARK LEFT  L LEG (A-D) 6601.0  L  THIGH (E-G) ml  L  FULL LIMB (A-G) ml  Limb Volume differential (LVD)  1.08% L>R  Volume change since initial %  Volume change overall %  (Blank rows = not tested)    RLE COMPARATIVE LIMB VOLUMETRICS: 9th visit 03/31/24   LANDMARK RIGHT   R LEG (A-D) 4941.4 ml  R THIGH (E-G) ml  R FULL LIMB (A-G) ml  Limb Volume differential (LVD)  %  Volume change since initial R LEG volume DECREASED by 24.3% since commencing OT for CDT on 02/17/24   Volume change overall V  (Blank rows = not tested)    SKIN/TISSUE INTEGRITY: : Moderate, Stage  II, Bilateral Lower Extremity Lymphedema 2/2 CVI,  Obesity, and Mm weakness  Skin  Description  Hyper-Keratosis Peau d' Orange Shiny Tight Fibrotic/ Indurated Fatty Hard Spongy/ boggy   x   x R>L x x x   Skin  dry Flaky WNL Macerated   mildly      Color Redness Varicosities Blanching Hemosiderin Stain Mottled        x   Odor Malodorous Yeast Fungal infection  WNL      x   Temperature Warm Cool wnl    x     Pitting Edema   1+ 2+ 3+ 4+ Non-pitting   x         Girth Symmetrical Asymmetrical                   Distribution    R>L toes to groin, bilaterally    Stemmer Sign Positive Negative   +    Lymphorrhea History Of:  Present Absent     x    Wounds History Of Present Absent Venous Arterial Pressure Sheer   denies  x        Signs of Infection Redness Warmth Erythema Acute Swelling Drainage Borders                    Sensation Light Touch Deep pressure Hypersensitivity   In tact Impaired In tact Impaired Absent Impaired   x  x  x     Nails WNL   Fungus nail dystrophy   TBA     Hair Growth Symmetrical Asymmetrical   TB Ax    Skin Creases Base of toes  Ankles   Base of Fingers knees       Abdominal pannus Thigh Lobules  Face/neck   x x  x       GAIT: Pt transported to clinic in transport wheelchair Distance walked: Pt able to transfer out of wc and walk 4-5 steps using 2 wheeled walker to Rx bed with extra time (modified independent).  Assistive device utilized: Pt able to lift legs onto Rx bed, one at a time, using hands to actively assist Level of assistance: Modified independence Comments: Transfers  and bed mobility take an inordinate amount of time  LYMPHEDEMA LIFE IMPACT SCALE (LLIS): Initial: 86.76% (The extent to which LE-related problems impacted your life over the past week.)                                                                                                                           TREATMENT THIS DATE: RLE/RLQ MLD Knee length, multilayer Compression wrapping using gradient techniques Pt/ CG edu   PATIENT EDUCATION:  Continued Pt/ CG edu for lymphedema self care home program throughout session. Topics include outcome of  comparative limb volumetrics- starting limb volume differentials (LVDs), technology and gradient techniques used for short stretch, multilayer compression wrapping, simple self-MLD, therapeutic lymphatic pumping exercises, skin/nail care, LE precautions, compression garment recommendations and specifications, wear and care schedule and compression garment donning / doffing w assistive devices. Discussed progress towards  all OT goals since commencing CDT. Discussed detrimental impact of obesity on lower and upper extremity lymphedema over time. Reviewed OT goals for lymphedema care with Pt and discussed progress to date.  All questions answered to the Pt's satisfaction. Good return. Person educated: Patient and family Education method: Explanation, Demonstration, and Handouts Education comprehension: verbalized understanding, returned demonstration, verbal cues required, and needs further education  LYMPHEDEMA SELF-CARE HOME PROGRAM: BLE lymphatic pumping there ex- 1 set of 10 each element, in order. Hold 5. 2 x daily 2. Daily, short stretch, thigh length, multilayer compression bandages during Intensive Phase CDT 3. During self-Management Phase fit with appropriate compression garments and/ or devices 3. Daily skin care with low ph lotion matching skin ph 4. Daily simple self MLD   ASSESSMENT:  CLINICAL IMPRESSION:Pt continues to have a difficult time controlling lower extremity swelling due to chronic arthritis inflammation in bilateral knees. We're hopeful that recent knee injections will provide some symptom relief. RLE swelling is decreased significantly compared with LLE. Continued RLE/ RLQ MLD as established without increased pain. Re applied multilayer wraps from base of toes to popliteal fossa as established. Pt tolerated all treatment modalities without increased pain. Fit RLE compression garment alternative ASAP. Pt states she has to reduce  Rx frequency to 1 x weekly due to transportation  difficulties. Reduced frequency lowered prognosis expectations significantly.  On 04/22/24 CircAid compression garment alternative order completed for RLE. Anatomical measurements reveal that Pt is unable to fit into an off-the-shelf, or ready made size of CircAid Juxtafit Essential. We completed measurements for a R legging today.  This custom compression garment alternative is medically necessary to control lymphedema below the knee being easier to don and doff than a standard elastic compression stocking. Pt's AROM limitations limit her ability to don and doff compression knee highs.    Cont as per POC.  (02/12/24 Initial OT Eval : Natasha [name] is a 73 yo female presenting with moderate, stage  II, bilateral lower extremity lymphedema 2/2 CVI,  obesity, and mm weakness. She will benefit from skilled Occupational Therapy to reduce limb swelling and associated pain, to limit progression and infection risk. BLE lymphedema limits functional performance in all occupational domains, including functional mobility and ambulation,  basic and instrumental ADLs, productive activities, leisure pursuits, social participation and quality of life. This patient will benefit from modified Intensive and Self Management Phase CDT . In addition to MLD, ther ex, skin care and compression bandaging below the knees, Pt will be fitted with custom knee length compression stockings paired with off the shelf , Capri length compression leggings. If insurance benefits allow, she may undergo a trial in the clinic with the advanced Flexitouch device in an effort to reduce discomfort and reduce infection risk. Without skilled OT Li-lymphedema will worsen over time and further functional decline is expected.  Natasha [name] is unable to reach feet and distal legs to apply multilayer , gradient compression wraps during the Intensive Phase of CDT.  She understands this limitation and agrees to  arrange for a caregiver to assist her daily with  compression wrapping between OT visits. With a caregiver to assist her with Intensive Phase compression her prognosis is fair. Without CG assistance her prognosis is poor.)  OBJECTIVE IMPAIRMENTS: decreased activity tolerance, decreased balance, decreased endurance, decreased knowledge of condition, decreased knowledge of use of DME, decreased mobility, difficulty walking, decreased ROM, decreased strength, increased edema, impaired UE functional use, postural dysfunction, obesity, pain, and chronic leg swelling.  ACTIVITY LIMITATIONS: ACTIVITY LIMITATIONS: Mobility and functional ambulation limitations:  abnormal gait pattern, difficulty walking, carrying, lifting, bending, sitting, standing, squatting, stairs, transfers, and bed mobility Basic and instrumental ADLs (reaching feet and distal legs to groom nails, inspect skin, apply lotion, bathe lower body, difficulty with LB dressing, including fitting LB clothing, shoes and socks, impaired sleeping, meal prep, standing to cook, driving, shopping, yard work, house work Paediatric nurse activities: work related activities requiring extended standing, walking, sitting; caring for others Social participation in the community, socializing with others, LE affects body image  Leisure pursuits requiring extended standing, walking, sitting  PERSONAL FACTORS: Fitness, Time since onset of injury/illness/exacerbation, and 3+ comorbidities: Obesity, arthritis, OSA are also affecting patient's functional outcome.   REHAB POTENTIAL: Fair with daily caregiver assistance with gradient compression wrapping. Without assistance prognosis is poor as Pt is unable to apply wraps independently  EVALUATION COMPLEXITY: Moderate   GOALS: Goals reviewed with patient? Yes   SHORT TERM GOALS: Target date: 4th OT Rx visit  Pt will demonstrate understanding of lymphedema precautions and prevention strategies with modified independence using a printed reference to identify at  least 5 precautions and discussing how s/he may implement them into daily life to reduce risk of progression with modified assistance Baseline: max a Goal status:GOAL MET   2.  With Max caregiver assistance Pt will be able to apply multilayer, knee length, compression wraps using gradient techniques to decrease limb volume, to limit infection risk, and to limit lymphedema progression.  Given this patient's Intake score of tbd % on the Lymphedema Life Impact Scale (LLIS), patient will experience a reduction of at least 5% in her perceived level of functional impairment resulting from lymphedema to improve functional performance and quality of life (QOL). Baseline: Dependent Goal status: 03/31/24 GOAL MET, but caregiver has limited availability to assist     LONG TERM GOALS: Target date: 05/13/24 Given this patient's Intake score of 86.76 % on the Lymphedema Life Impact Scale (LLIS), patient will experience a reduction of at least 10% in her perceived level of functional impairment resulting from lymphedema to improve functional performance and quality of life (QOL).Baseline: tbd Baseline: max a Goal status: PROGRESSING     2.  With modified independence (extra time and assistive devices) Pt will be able to don and doff appropriate compression garments and/or devices to control BLE lymphedema and to limit progression.  Baseline: Dependent Goal status: PROGRESSING   3. Pt will achieve at least a 10% volume reductions bilaterally below the knees to return limb to more typical size and shape, to limit infection risk and LE progression, to decrease pain, to improve function, and to improve body image and QOL. Baseline: Dependent Goal status:PROGRESSING. Volumetrics reveal R LEG volume DECREASED by 24.3% since commencing OT for CDT on 02/17/24. This value meets and exceeds the initial 10% volume reduction goal for the RLE. Goal elevated to 25% bilaterally.    4. Pt will achieve and sustain at least 85%  compliance with all LE self-care home program components throughout CDT, including modified simple self-MLD, daily skin care and inspection, lymphatic pumping the ex and appropriate compression to limit lymphedema progression and to limit further functional decline. Baseline: Dependent. (Note: W/out daily assistance with donning/ doffing compression  , prognosis is poor.) Goal status:PROGRESSING.Unfortunately there is limited caregiver follow thru , so compliance is limited for compression and attendance.    PLAN:  OT FREQUENCY: 2x/week and PRN Reduce OT too 1 x weekly per Pt request due to  transportation  OT DURATION: other: 18 weeks and PRN  PLANNED INTERVENTIONS:  Complete Decongestive Therapy (CDT): Manual Lymphatic Drainage (MLD) , Skin care, ther ex, gradient compression 97110-Therapeutic exercises, 97530- Therapeutic activity, 97535- Self Care, [id number]- Manual therapy, Patient/Family education, Manual lymph drainage, Compression bandaging, DME instructions, and trial with advanced, sequential, pneumatic, compression device (Tactile Medical Flexitouch) Pt will arrange for a caregiver to assist her daily with compression wrapping between OT visits.   PLAN FOR NEXT SESSION:  Pt/ caregiver edu Knee length, Multilayer compression wraps to R leg only MLD Fit RLE CircAid asap.  Zebedee Dec, Natasha, OTR/L, CLT-LANA  '

## 2024-05-13 NOTE — Patient Instructions (Signed)
 Visit Information  Thank you for taking time to visit with me today. Please don't hesitate to contact me if I can be of assistance to you before our next scheduled appointment.  Our next appointment is by telephone on 05/26/24 at 09:30 AM Please call the care guide team at [phone] if you need to cancel or reschedule your appointment.   Following is a copy of your care plan:   Goals Addressed             This Visit's Progress    VBCI RN Care Plan related to lymphedema to bilaterl legs; Impaired Physical Mobility; Falls       Problems:  Chronic Disease Management support and education needs related to lymphedema to lower extremities; Impaired Physical Mobility; Falls  Goal: Over the next 4 months the Patient will continue to work with RN Care Manager and/or Social Worker to address care management and care coordination needs related to lymphedema to bilateral legs; Impaired Physical Mobility; Falls  as evidenced by adherence to care management team scheduled appointments      Interventions:   Evaluation of current treatment plan related to lymphedema to bilateral lower extremities; Impaired Physical Mobility; Falls , self-management and patient's adherence to plan as established by provider Reviewed and discussed with patient she is currently unable to wrap her legs bilaterally for lymphedema management, she is having difficulty getting to her wound care clinic visits Determined patient is suffering from Impaired Physical Mobility as a result of her lymphedema, she has experienced several near falls  Educated patient about [facility] and discussed this home based primary care provider may be a good option for her to receive the care needed while she is dealing with the lymphedema, patient is agreeable to having a referral sent to [facility]  Secure email sent to Katheryn Favors RN requesting outreach to patient for initiation of services to [facility]  Assessed for DME needs    Falls Interventions:   Musculoskelatal Symptoms Reviewed: Limited mobility, Difficulty walking, Unsteady gait, Muscle pain, Weakness Additional Musculoskeletal Details: using a walker for mobility Provided written and verbal education re: potential causes of falls and Fall prevention strategies Reviewed medications and discussed potential side effects of medications such as dizziness and frequent urination Advised patient of importance of notifying provider of falls Assessed for signs and symptoms of orthostatic hypotension Assessed for SDOH barriers - SW referral sent  Patient at Risk for Falls Due to: Impaired balance/gait, Impaired mobility Fall risk Follow up: Falls evaluation completed Discussed plans with patient for ongoing care management follow up and provided patient with direct contact information for care management team   Patient Self-Care Activities:  Attend all scheduled provider appointments Call pharmacy for medication refills 3-7 days in advance of running out of medications Call provider office for new concerns or questions  Take medications as prescribed   Work with the nurse care manager to address care coordination needs and will continue to work with the clinical team to address health care and disease management related needs  Plan:  Telephone follow up appointment with care management team member scheduled for:  Wednesday, September 24 at 09:30 AM Sent SW referral  Sent referral to [facility]              Please call 1-800-273-TALK (toll free, 24 hour hotline) if you are experiencing a Mental Health or Behavioral Health Crisis or need someone to talk to.  Patient verbalizes understanding of instructions and care plan provided  today and agrees to view in MyChart. Active MyChart status and patient understanding of how to access instructions and care plan via MyChart confirmed with patient.     Clayborne Ly RN BSN CCM [place]  [facility], [facility] Nurse Care Coordinator  Direct Dial: [phone] Website: [email or web address]

## 2024-05-14 ENCOUNTER — Telehealth: Payer: Self-pay

## 2024-05-14 NOTE — Progress Notes (Signed)
 Complex Care Management Note Care Guide Note  05/14/2024 Name: Natasha [name] MRN: [id number] DOB: 1950/11/28   Complex Care Management Outreach Attempts: A third unsuccessful outreach was attempted today to offer the patient with information about available complex care management services.  Follow Up Plan:  No further outreach attempts will be made at this time. We have been unable to contact the patient to offer or enroll patient in complex care management services.  Encounter Outcome:  No Answer  Gedalia Mcmillon [facility]  [facility] Guide Direct Dial: [phone]  Fax: [phone] Website: [email or web address]

## 2024-05-14 NOTE — Progress Notes (Signed)
 Complex Care Management Note Care Guide Note  05/14/2024 Name: Natasha [name] MRN: [id number] DOB: Nov 10, 1950   Complex Care Management Outreach Attempts: A third unsuccessful outreach was attempted today to offer the patient with information about available complex care management services.  Follow Up Plan:  Additional outreach attempts will be made to offer the patient complex care management information and services.   Encounter Outcome:  No Answer  Jonella Redditt [facility]  [facility] Guide Direct Dial: [phone]  Fax: [phone] Website: [email or web address]

## 2024-05-17 ENCOUNTER — Ambulatory Visit: Admitting: Occupational Therapy

## 2024-05-17 ENCOUNTER — Ambulatory Visit: Admitting: Physical Therapy

## 2024-05-19 ENCOUNTER — Ambulatory Visit: Admitting: Physical Therapy

## 2024-05-19 ENCOUNTER — Ambulatory Visit: Admitting: Occupational Therapy

## 2024-05-20 DIAGNOSIS — F411 Generalized anxiety disorder: Secondary | ICD-10-CM | POA: Diagnosis not present

## 2024-05-24 ENCOUNTER — Ambulatory Visit: Admitting: Occupational Therapy

## 2024-05-24 ENCOUNTER — Ambulatory Visit: Admitting: Physical Therapy

## 2024-05-26 ENCOUNTER — Ambulatory Visit: Admitting: Occupational Therapy

## 2024-05-26 ENCOUNTER — Ambulatory Visit: Admitting: Physical Therapy

## 2024-05-26 ENCOUNTER — Other Ambulatory Visit: Payer: Self-pay

## 2024-05-26 DIAGNOSIS — I890 Lymphedema, not elsewhere classified: Secondary | ICD-10-CM | POA: Diagnosis not present

## 2024-05-26 DIAGNOSIS — R262 Difficulty in walking, not elsewhere classified: Secondary | ICD-10-CM | POA: Diagnosis not present

## 2024-05-26 DIAGNOSIS — G8929 Other chronic pain: Secondary | ICD-10-CM | POA: Diagnosis not present

## 2024-05-26 DIAGNOSIS — M6281 Muscle weakness (generalized): Secondary | ICD-10-CM | POA: Diagnosis not present

## 2024-05-26 DIAGNOSIS — R260 Ataxic gait: Secondary | ICD-10-CM | POA: Diagnosis not present

## 2024-05-26 DIAGNOSIS — R2689 Other abnormalities of gait and mobility: Secondary | ICD-10-CM | POA: Diagnosis not present

## 2024-05-26 DIAGNOSIS — M25562 Pain in left knee: Secondary | ICD-10-CM | POA: Diagnosis not present

## 2024-05-26 DIAGNOSIS — M25561 Pain in right knee: Secondary | ICD-10-CM | POA: Diagnosis not present

## 2024-05-26 NOTE — Therapy (Unsigned)
 OUTPATIENT OCCUPATIONAL THERAPY TREATMENT NOTE   BILATERAL LOWER EXTREMITY/ BLQ LYMPHEDEMA  Patient Name: Natasha [name] MRN: [id number] DOB:[birth year], 73 y.o., female Today's Date: 05/28/2024  REPORTING PERIOD:  END OF SESSION:  OT End of Session - 05/28/24 1409     Visit Number 16    Number of Visits 36    Date for Recertification  08/24/24    OT Start Time 0305    OT Stop Time 0410    OT Time Calculation (min) 65 min    Activity Tolerance Patient tolerated treatment well;No increased pain    Behavior During Therapy [facility] for tasks assessed/performed             Past Medical History:  Diagnosis Date   Abdominal spasms 12/14/2020   Acute pain of right shoulder 01/31/2023   Acute pulmonary embolism (HCC) 08/21/2019   Bacterial conjunctivitis of both eyes 12/14/2020   Bilateral leg edema 02/10/2020   Bradycardia    Breast cancer screening by mammogram 11/24/2020   Candida infection 10/11/2022   CARCINOMA, THYROID  GLAND, PAPILLARY 05/04/2008   Stage 2, 8/09: thyroidectomy for 2.7cm papillary adenocarcinoma (t2 n0 mo) 9/09: I-131 rx, 108 mci 05/10: tg is neg (ab neg) , total body scan is neg   Chronic right-sided low back pain with right-sided sciatica 11/24/2020   Colon cancer screening 11/24/2020   Colon polyps    Complication of anesthesia    trouble with airway   Cough 12/25/2007   Qualifier: Diagnosis of  By: Germaine LPN, Megan     RNCPI-80 08/19/2019   Decreased ROM of neck 01/31/2023   Diverticulosis    DVT (deep venous thrombosis) (HCC)    Edema 12/22/2008   Encounter to establish care 11/24/2020   Groin pain, chronic, right 11/24/2020   Health care maintenance 11/26/2022   Hip pain 12/22/2020   History of 2019 novel coronavirus disease (COVID-19) 11/09/2019   HYPERTENSION 12/25/2007   HYPOTHYROIDISM, POSTSURGICAL 05/04/2008   LEG PAIN, LEFT 12/09/2008   Low TSH level 12/10/2022   Lumbago with sciatica, right side 01/08/2021   Memory loss due to  medical condition 11/09/2019   Muscle weakness    Nausea and vomiting 05/23/2008   Qualifier: Diagnosis of  By: Kassie MD, Alyce LABOR   Formatting of this note might be different from the original. Qualifier: Diagnosis of  By: Kassie MD, Sean A   Neck pain 01/31/2023   Non-recurrent acute suppurative otitis media of left ear without spontaneous rupture of tympanic membrane 02/17/2023   Numbness and tingling    Obesity, morbid, BMI 50 or higher (HCC) 08/24/2019   OSA (obstructive sleep apnea) 06/15/2013   CPAP   Paresthesia 01/04/2020   Peripheral edema 12/22/2008   Qualifier: Diagnosis of  By: Delford, MD, CODY Maude Dunnings    Pneumonia due to COVID-19 virus    Pulmonary embolism (HCC)    Pulmonary embolism (HCC) 08/23/2019   Right lower quadrant pain 11/24/2020   Sciatica of left side 12/04/2011   Skin sensation disturbance 12/05/2008   Qualifier: Diagnosis of  By: Inocencio MD, Berwyn LABOR Deal of this note might be different from the original. Qualifier: Diagnosis of  By: Inocencio MD, Valerie A   Snoring 12/10/2022   Spasm of muscle of lower back 12/14/2020   Upper back pain 01/31/2023   Vaginal candidiasis 03/16/2021   Weakness 01/04/2020   Past Surgical History:  Procedure Laterality Date   ABDOMINAL HYSTERECTOMY     due to bleeding, ovaries remain  ANKLE SURGERY Right    CERVICAL FUSION     x 2   COLONOSCOPY WITH PROPOFOL  N/A 08/08/2015   Procedure: COLONOSCOPY WITH PROPOFOL ;  Surgeon: Renaye Sous, MD;  Location: [facility];  Service: Endoscopy;  Laterality: N/A;   KNEE SURGERY Left    TOTAL THYROIDECTOMY     Patient Active Problem List   Diagnosis Date Noted   Anemia 03/04/2024   Chronic venous insufficiency 09/10/2023   Weakness of both lower extremities 09/10/2023   Atrophic vaginitis 05/14/2023   Pelvic floor dysfunction 05/14/2023   Pancreatic insufficiency 05/14/2023   Arthritis of knee 03/24/2023   At risk for fall due to comorbid condition  03/10/2023   Mood changes 02/17/2023   Recurrent urinary tract infection 01/31/2023   Walker as ambulation aid 01/31/2023   B12 deficiency 11/26/2022   Other fatigue 11/26/2022   Prediabetes 11/26/2022   Obesity, Class III, BMI 40-49.9 (morbid obesity) 11/26/2022   Dermatofibroma 10/11/2022   Fatty liver 04/20/2021   Incontinence of urine in female 04/20/2021   Vitamin D  deficiency 03/16/2021   Constipation 11/24/2020   Gastroesophageal reflux disease 11/24/2020   OAB (overactive bladder) 11/24/2020   Aortic atherosclerosis 11/24/2020   Right-sided heart failure (HCC) 11/24/2020   History of pulmonary embolism 02/10/2020   Lymphedema 02/10/2020   Mobility impaired 01/04/2020   Muscle weakness (generalized) 11/09/2019   Pulmonary nodule 11/09/2019   OSA (obstructive sleep apnea) 06/15/2013   Allergic rhinitis 01/21/2009   History of papillary adenocarcinoma of thyroid  05/04/2008   Hypothyroidism, postsurgical 05/04/2008   Essential hypertension 12/25/2007   SOB (shortness of breath) on exertion 12/25/2007    PCP: Camie Doing, NP  REFERRING PROVIDER: Camie Doing, NP  REFERRING DIAG: I89.0  THERAPY DIAG:  Lymphedema, not elsewhere classified  Rationale for Evaluation and Treatment: Rehabilitation  ONSET DATE: >15 years  SUBJECTIVE:                                                                                                                                                                                          SUBJECTIVE STATEMENT: Ms. Whittier presents to OT in manual transport wc for LE care. Pt is accompanied by her granddaughter and her friend. Pt is on time for our 60 minute session. Pt reports leg pain at 3-4/10. Pt reports her legs are not really hurting today. Pt was able to participate in sit to be fit exercise classes. Pt has gotten some relief from knee injections.    PERTINENT HISTORY:  Hypothyroidism, postsurgical Essential hypertension Dyspnea OSA  (obstructive sleep apnea) Obesity, morbid, BMI 50 or higher (HCC) Muscle weakness (generalized) Gait abnormality History of pulmonary  embolism Bilateral lower extremity edema Chronic right-sided low back pain with right-sided sciatica Right-sided heart failure (HCC) Hip pain Incontinence of urine in female Dermatofibroma Morbid obesity (HCC) BMI 50.0-59.9, adult (HCC) SOB (shortness of breath) on exertion Other fatigue Prediabetes Decreased ROM of neck Upper back pain Acute pain of right shoulder Recurrent urinary tract infection Urine frequency Walker as ambulation aid Ambulatory dysfunction Mood changes At risk for fall due to comorbid condition Arthritis of knee Hypothyroidism Chronic venous insufficiency Mobility impaired Weakness of both lower extremities Gait instability Lymphedema BMI 45.0-49.9, adult (HCC)  PAIN:  Are you having pain? Yes, B knees: NPRS scale: /10 Pain location: seated and at rest Pain description: creaky crackly, tight, heavy, sore, tender. With and without weight bearing Aggravating factors: standing, walking, weight bearing Relieving factors: walking  PRECAUTIONS: Other: LYMPHEDEMA PRECAUTIONS: prediabetic, hypothyroid, Fall Risk  RED FLAGS: Urinary incontinence; numbness and tingling in fingers, hands and toes   WEIGHT BEARING RESTRICTIONS: No  FALLS:  Has patient fallen in last 6 months? No  LIVING ENVIRONMENT: Lives with: lives with their daughter and  granddaughter Lives in: House/apartment Stairs: Yes; Internal: 14 steps; on right going up and External: garage 4 steps steps; can reach both Has following equipment at home: Walker - 4 wheeled, shower chair, and elevated toilet seat  OCCUPATION: retired Careers information officer professor  LEISURE: concerts, writing and producing plays  HAND DOMINANCE: right   PRIOR LEVEL OF FUNCTION: Independent  PATIENT GOALS: walk unassisted; lose weight   OBJECTIVE: Note: Objective measures were  completed at Evaluation unless otherwise noted.  COGNITION:  Overall cognitive status: Within functional limits for tasks assessed   OBSERVATIONS / OTHER ASSESSMENTS:   POSTURE: head forward, slight shoulder protraction  LE ROM: Limited at hips knees and ankles 2/2 body habitus and skin approximation  LE MMT: generalized weakness  LYMPHEDEMA ASSESSMENTS:   BLE COMPARATIVE LIMB VOLUMETRICS: Initial 02/17/24  LANDMARK RIGHT   R LEG (A-D) 6530.2 ml  R THIGH (E-G) ml  R FULL LIMB (A-G) ml  Limb Volume differential (LVD)  %  Volume change since initial %  Volume change overall V  (Blank rows = not tested)  LANDMARK LEFT  L LEG (A-D) 6601.0  L  THIGH (E-G) ml  L  FULL LIMB (A-G) ml  Limb Volume differential (LVD)  1.08% L>R  Volume change since initial %  Volume change overall %  (Blank rows = not tested)    RLE COMPARATIVE LIMB VOLUMETRICS: 9th visit 03/31/24   LANDMARK RIGHT   R LEG (A-D) 4941.4 ml  R THIGH (E-G) ml  R FULL LIMB (A-G) ml  Limb Volume differential (LVD)  %  Volume change since initial R LEG volume DECREASED by 24.3% since commencing OT for CDT on 02/17/24   Volume change overall V  (Blank rows = not tested)    SKIN/TISSUE INTEGRITY: : Moderate, Stage  II, Bilateral Lower Extremity Lymphedema 2/2 CVI,  Obesity, and Mm weakness  Skin  Description Hyper-Keratosis Peau d' Orange Shiny Tight Fibrotic/ Indurated Fatty Hard Spongy/ boggy   x   x R>L x x x   Skin dry Flaky WNL Macerated   mildly      Color Redness Varicosities Blanching Hemosiderin Stain Mottled        x   Odor Malodorous Yeast Fungal infection  WNL      x   Temperature Warm Cool wnl    x     Pitting Edema   1+ 2+ 3+ 4+ Non-pitting  x         Girth Symmetrical Asymmetrical                   Distribution    R>L toes to groin, bilaterally    Stemmer Sign Positive Negative   +    Lymphorrhea History Of:  Present Absent     x    Wounds History Of Present Absent  Venous Arterial Pressure Sheer   denies  x        Signs of Infection Redness Warmth Erythema Acute Swelling Drainage Borders                    Sensation Light Touch Deep pressure Hypersensitivity   In tact Impaired In tact Impaired Absent Impaired   x  x  x     Nails WNL   Fungus nail dystrophy   TBA     Hair Growth Symmetrical Asymmetrical   TB Ax    Skin Creases Base of toes  Ankles   Base of Fingers knees       Abdominal pannus Thigh Lobules  Face/neck   x x  x       GAIT: Pt transported to clinic in transport wheelchair Distance walked: Pt able to transfer out of wc and walk 4-5 steps using 2 wheeled walker to Rx bed with extra time (modified independent).  Assistive device utilized: Pt able to lift legs onto Rx bed, one at a time, using hands to actively assist Level of assistance: Modified independence Comments: Transfers  and bed mobility take an inordinate amount of time  LYMPHEDEMA LIFE IMPACT SCALE (LLIS): Initial: 86.76% (The extent to which LE-related problems impacted your life over the past week.)                                                                                                                           TREATMENT THIS DATE: RLE/RLQ MLD Knee length, multilayer Compression wrapping using gradient techniques Pt/ CG edu   PATIENT EDUCATION:  Continued Pt/ CG edu for lymphedema self care home program throughout session. Topics include outcome of comparative limb volumetrics- starting limb volume differentials (LVDs), technology and gradient techniques used for short stretch, multilayer compression wrapping, simple self-MLD, therapeutic lymphatic pumping exercises, skin/nail care, LE precautions, compression garment recommendations and specifications, wear and care schedule and compression garment donning / doffing w assistive devices. Discussed progress towards all OT goals since commencing CDT. Discussed detrimental impact of obesity on lower and  upper extremity lymphedema over time. Reviewed OT goals for lymphedema care with Pt and discussed progress to date.  All questions answered to the Pt's satisfaction. Good return. Person educated: Patient and family Education method: Explanation, Demonstration, and Handouts Education comprehension: verbalized understanding, returned demonstration, verbal cues required, and needs further education  LYMPHEDEMA SELF-CARE HOME PROGRAM: BLE lymphatic pumping there ex- 1 set of 10 each element, in order. Hold 5. 2 x daily 2. Daily, short  stretch, thigh length, multilayer compression bandages during Intensive Phase CDT 3. During self-Management Phase fit with appropriate compression garments and/ or devices 3. Daily skin care with low ph lotion matching skin ph 4. Daily simple self MLD   ASSESSMENT:  CLINICAL IMPRESSION:Pt continues to have a difficult time controlling lower extremity swelling due to chronic arthritis inflammation in bilateral knees. We're hopeful that recent knee injections will provide some symptom relief. RLE swelling is decreased significantly compared with LLE. Continued RLE/ RLQ MLD as established without increased pain. Re applied multilayer wraps from base of toes to popliteal fossa as established. Pt tolerated all treatment modalities without increased pain. Fit RLE compression garment alternative ASAP. Pt states she has to reduce  Rx frequency to 1 x weekly due to transportation difficulties. Reduced frequency lowered prognosis expectations significantly.  On 04/22/24 CircAid compression garment alternative order completed for RLE. Anatomical measurements reveal that Pt is unable to fit into an off-the-shelf, or ready made size of CircAid Juxtafit Essential. We completed measurements for a R legging today.  This custom compression garment alternative is medically necessary to control lymphedema below the knee being easier to don and doff than a standard elastic compression  stocking. Pt's AROM limitations limit her ability to don and doff compression knee highs.    Cont as per POC.  (02/12/24 Initial OT Eval : Natasha [name] is a 73 yo female presenting with moderate, stage  II, bilateral lower extremity lymphedema 2/2 CVI,  obesity, and mm weakness. She will benefit from skilled Occupational Therapy to reduce limb swelling and associated pain, to limit progression and infection risk. BLE lymphedema limits functional performance in all occupational domains, including functional mobility and ambulation,  basic and instrumental ADLs, productive activities, leisure pursuits, social participation and quality of life. This patient will benefit from modified Intensive and Self Management Phase CDT . In addition to MLD, ther ex, skin care and compression bandaging below the knees, Pt will be fitted with custom knee length compression stockings paired with off the shelf , Capri length compression leggings. If insurance benefits allow, she may undergo a trial in the clinic with the advanced Flexitouch device in an effort to reduce discomfort and reduce infection risk. Without skilled OT Li-lymphedema will worsen over time and further functional decline is expected.  Ms Want is unable to reach feet and distal legs to apply multilayer , gradient compression wraps during the Intensive Phase of CDT.  She understands this limitation and agrees to  arrange for a caregiver to assist her daily with compression wrapping between OT visits. With a caregiver to assist her with Intensive Phase compression her prognosis is fair. Without CG assistance her prognosis is poor.)  OBJECTIVE IMPAIRMENTS: decreased activity tolerance, decreased balance, decreased endurance, decreased knowledge of condition, decreased knowledge of use of DME, decreased mobility, difficulty walking, decreased ROM, decreased strength, increased edema, impaired UE functional use, postural dysfunction, obesity, pain, and chronic  leg swelling.   ACTIVITY LIMITATIONS: ACTIVITY LIMITATIONS: Mobility and functional ambulation limitations:  abnormal gait pattern, difficulty walking, carrying, lifting, bending, sitting, standing, squatting, stairs, transfers, and bed mobility Basic and instrumental ADLs (reaching feet and distal legs to groom nails, inspect skin, apply lotion, bathe lower body, difficulty with LB dressing, including fitting LB clothing, shoes and socks, impaired sleeping, meal prep, standing to cook, driving, shopping, yard work, house work Paediatric nurse activities: work related activities requiring extended standing, walking, sitting; caring for others Social participation in the community, socializing with others, LE affects  body image  Leisure pursuits requiring extended standing, walking, sitting  PERSONAL FACTORS: Fitness, Time since onset of injury/illness/exacerbation, and 3+ comorbidities: Obesity, arthritis, OSA are also affecting patient's functional outcome.   REHAB POTENTIAL: Fair with daily caregiver assistance with gradient compression wrapping. Without assistance prognosis is poor as Pt is unable to apply wraps independently  EVALUATION COMPLEXITY: Moderate   GOALS: Goals reviewed with patient? Yes   SHORT TERM GOALS: Target date: 4th OT Rx visit  Pt will demonstrate understanding of lymphedema precautions and prevention strategies with modified independence using a printed reference to identify at least 5 precautions and discussing how s/he may implement them into daily life to reduce risk of progression with modified assistance Baseline: max a Goal status:GOAL MET   2.  With Max caregiver assistance Pt will be able to apply multilayer, knee length, compression wraps using gradient techniques to decrease limb volume, to limit infection risk, and to limit lymphedema progression.  Given this patient's Intake score of tbd % on the Lymphedema Life Impact Scale (LLIS), patient will experience a  reduction of at least 5% in her perceived level of functional impairment resulting from lymphedema to improve functional performance and quality of life (QOL). Baseline: Dependent Goal status: 03/31/24 GOAL MET, but caregiver has limited availability to assist     LONG TERM GOALS: Target date: 05/13/24 Given this patient's Intake score of 86.76 % on the Lymphedema Life Impact Scale (LLIS), patient will experience a reduction of at least 10% in her perceived level of functional impairment resulting from lymphedema to improve functional performance and quality of life (QOL).Baseline: tbd Baseline: max a Goal status: PROGRESSING     2.  With modified independence (extra time and assistive devices) Pt will be able to don and doff appropriate compression garments and/or devices to control BLE lymphedema and to limit progression.  Baseline: Dependent Goal status: PROGRESSING   3. Pt will achieve at least a 10% volume reductions bilaterally below the knees to return limb to more typical size and shape, to limit infection risk and LE progression, to decrease pain, to improve function, and to improve body image and QOL. Baseline: Dependent Goal status:PROGRESSING. Volumetrics reveal R LEG volume DECREASED by 24.3% since commencing OT for CDT on 02/17/24. This value meets and exceeds the initial 10% volume reduction goal for the RLE. Goal elevated to 25% bilaterally.    4. Pt will achieve and sustain at least 85% compliance with all LE self-care home program components throughout CDT, including modified simple self-MLD, daily skin care and inspection, lymphatic pumping the ex and appropriate compression to limit lymphedema progression and to limit further functional decline. Baseline: Dependent. (Note: W/out daily assistance with donning/ doffing compression  , prognosis is poor.) Goal status:PROGRESSING.Unfortunately there is limited caregiver follow thru , so compliance is limited for compression and  attendance.    PLAN:  OT FREQUENCY: 2x/week and PRN Reduce OT too 1 x weekly per Pt request due to transportation  OT DURATION: other: 18 weeks and PRN  PLANNED INTERVENTIONS:  Complete Decongestive Therapy (CDT): Manual Lymphatic Drainage (MLD) , Skin care, ther ex, gradient compression 97110-Therapeutic exercises, 97530- Therapeutic activity, 97535- Self Care, [id number]- Manual therapy, Patient/Family education, Manual lymph drainage, Compression bandaging, DME instructions, and trial with advanced, sequential, pneumatic, compression device (Tactile Medical Flexitouch) Pt will arrange for a caregiver to assist her daily with compression wrapping between OT visits.   PLAN FOR NEXT SESSION:  Pt/ caregiver edu Knee length, Multilayer compression wraps to R  leg only MLD Fit RLE CircAid asap.  Zebedee Dec, MS, OTR/L, CLT-LANA  '
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
## 2024-05-26 NOTE — Patient Outreach (Signed)
 Complex Care Management   Visit Note  05/26/2024  Name:  Natasha [name] MRN: [id number] DOB: 1951/01/12  Situation: Referral received for Complex Care Management related to  Right-Sided Hearted Failure, Chronic Venous Insufficiency, Pelvic Floor dysfunction, Overactive Bladder, recurrent UTI, Obesity, Class III, BMI 40-49.9 (morbid obesity), history of Pulmonary Embolism, Bilateral lymphedema to LE, OSA, Weakness of both lower extremities, Arthritis to both knees. I obtained verbal consent from Patient.  Visit completed with Patient on the phone.  Background:   Past Medical History:  Diagnosis Date   Abdominal spasms 12/14/2020   Acute pain of right shoulder 01/31/2023   Acute pulmonary embolism (HCC) 08/21/2019   Bacterial conjunctivitis of both eyes 12/14/2020   Bilateral leg edema 02/10/2020   Bradycardia    Breast cancer screening by mammogram 11/24/2020   Candida infection 10/11/2022   CARCINOMA, THYROID  GLAND, PAPILLARY 05/04/2008   Stage 2, 8/09: thyroidectomy for 2.7cm papillary adenocarcinoma (t2 n0 mo) 9/09: I-131 rx, 108 mci 05/10: tg is neg (ab neg) , total body scan is neg   Chronic right-sided low back pain with right-sided sciatica 11/24/2020   Colon cancer screening 11/24/2020   Colon polyps    Complication of anesthesia    trouble with airway   Cough 12/25/2007   Qualifier: Diagnosis of  By: Germaine LPN, Megan     RNCPI-80 08/19/2019   Decreased ROM of neck 01/31/2023   Diverticulosis    DVT (deep venous thrombosis) (HCC)    Edema 12/22/2008   Encounter to establish care 11/24/2020   Groin pain, chronic, right 11/24/2020   Health care maintenance 11/26/2022   Hip pain 12/22/2020   History of 2019 novel coronavirus disease (COVID-19) 11/09/2019   HYPERTENSION 12/25/2007   HYPOTHYROIDISM, POSTSURGICAL 05/04/2008   LEG PAIN, LEFT 12/09/2008   Low TSH level 12/10/2022   Lumbago with sciatica, right side 01/08/2021   Memory loss due to medical condition  11/09/2019   Muscle weakness    Nausea and vomiting 05/23/2008   Qualifier: Diagnosis of  By: Kassie MD, Alyce LABOR   Formatting of this note might be different from the original. Qualifier: Diagnosis of  By: Kassie MD, Sean A   Neck pain 01/31/2023   Non-recurrent acute suppurative otitis media of left ear without spontaneous rupture of tympanic membrane 02/17/2023   Numbness and tingling    Obesity, morbid, BMI 50 or higher (HCC) 08/24/2019   OSA (obstructive sleep apnea) 06/15/2013   CPAP   Paresthesia 01/04/2020   Peripheral edema 12/22/2008   Qualifier: Diagnosis of  By: Delford, MD, CODY Maude Dunnings    Pneumonia due to COVID-19 virus    Pulmonary embolism (HCC)    Pulmonary embolism (HCC) 08/23/2019   Right lower quadrant pain 11/24/2020   Sciatica of left side 12/04/2011   Skin sensation disturbance 12/05/2008   Qualifier: Diagnosis of  By: Inocencio MD, [place] LABOR Deal of this note might be different from the original. Qualifier: Diagnosis of  By: Inocencio MD, [place] LABOR   Snoring 12/10/2022   Spasm of muscle of lower back 12/14/2020   Upper back pain 01/31/2023   Vaginal candidiasis 03/16/2021   Weakness 01/04/2020    Assessment: Patient Reported Symptoms:  Cognitive Cognitive Status: Alert and oriented to person, place, and time, Normal speech and language skills Cognitive/Intellectual Conditions Management [RPT]: None reported or documented in medical history or problem list   Health Maintenance Behaviors: Annual physical exam, Immunizations, Healthy diet Health Facilitated by: Pain control, Rest, Healthy diet  Neurological Neurological Review of Symptoms: Not assessed    HEENT HEENT Symptoms Reported: Not assessed      Cardiovascular Cardiovascular Symptoms Reported: Swelling in legs or feet Does patient have uncontrolled Hypertension?: No Cardiovascular Management Strategies: Routine screening, Medication therapy, Adequate rest Cardiovascular  Self-Management Outcome: 4 (good)  Respiratory Respiratory Symptoms Reported: Not assesed    Endocrine Endocrine Symptoms Reported: Not assessed    Gastrointestinal Gastrointestinal Symptoms Reported: Not assessed      Genitourinary Genitourinary Symptoms Reported: Not assessed    Integumentary Integumentary Symptoms Reported: Skin changes Other Integumentary Symptoms: lymphedema to bilateral legs, worse on the right Skin Management Strategies: Dressing changes, Adequate rest, Routine screening Skin Self-Management Outcome: 4 (good) Skin Comment: elevates extremities while resting  Musculoskeletal Musculoskelatal Symptoms Reviewed: Difficulty walking, Weakness, Unsteady gait, Limited mobility Additional Musculoskeletal Details: continues to use a walker for mobility; wearing lymphedema pump on right leg to thigh Musculoskeletal Management Strategies: Medication therapy, Routine screening, Exercise, Medical device Musculoskeletal Self-Management Outcome: 3 (uncertain) Musculoskeletal Comment: Educated patient about the PREP program, message sent to PCP requesting a referral Falls in the past year?: No Number of falls in past year: 1 or less Was there an injury with Fall?: No Fall Risk Category Calculator: 0 Patient Fall Risk Level: Low Fall Risk Patient at Risk for Falls Due to: Impaired mobility, Impaired balance/gait Fall risk Follow up: Falls evaluation completed, Education provided, Falls prevention discussed  Psychosocial Psychosocial Symptoms Reported: No symptoms reported     Quality of Family Relationships: supportive    05/26/2024    PHQ2-9 Depression Screening   Natasha [name] interest or pleasure in doing things    Feeling down, depressed, or hopeless    PHQ-2 - Total Score    Trouble falling or staying asleep, or sleeping too much    Feeling tired or having Natasha [name] energy    Poor appetite or overeating     Feeling bad about yourself - or that you are a failure or have let  yourself or your family down    Trouble concentrating on things, such as reading the newspaper or watching television    Moving or speaking so slowly that other people could have noticed.  Or the opposite - being so fidgety or restless that you have been moving around a lot more than usual    Thoughts that you would be better off dead, or hurting yourself in some way    PHQ2-9 Total Score    If you checked off any problems, how difficult have these problems made it for you to do your work, take care of things at home, or get along with other people    Depression Interventions/Treatment      There were no vitals filed for this visit.  Medications Reviewed Today     Reviewed by Morgan Clayborne CROME, RN (Registered Nurse) on 05/26/24 at [phone]  Med List Status: <None>   Medication Order Taking? Sig Documenting Provider Last Dose Status Informant  acetaminophen  (TYLENOL ) 500 MG tablet [id number]  Take 1 tablet (500 mg total) by mouth every 6 (six) hours as needed.  Patient not taking: Reported on 05/07/2024   Charlyn Sora, MD  Active            Med Note DELILA, ADAM D   Thu Mar 13, 2023  3:16 PM) Prn   acetaminophen  (TYLENOL ) 650 MG CR tablet [id number]  Take 650 mg by mouth every 8 (eight) hours as needed for pain.  Patient taking differently: Take 650  mg by mouth every 8 (eight) hours as needed for pain. Patient is taking 1 tablet twice daily   [provider]  Active   AMBULATORY NON FORMULARY MEDICATION [id number]  Motorized Scooter based on insurance coverage. Early, Sara E, NP  Active   cephALEXin  (KEFLEX ) 500 MG capsule [id number]  Take 1 capsule (500 mg total) by mouth 3 (three) times daily.  Patient not taking: Reported on 05/07/2024   EarlyCamie BRAVO, NP  Active   Elastic Bandages & Supports (MEDICAL COMPRESSION STOCKINGS) MISC [id number]  Thigh high compression stockings for venous insufficiency and lymphedema. Please measure ankle, calf, and thigh circumference and leg length for fit.  Brand per insurance. Requesting one pair. Early, Sara E, NP  Active   Elastic Bandages & Supports (MEDICAL COMPRESSION STOCKINGS) MISC [id number]  30-40 mmHg knee length compression stockings for venous insufficiency and lymphedema. Please measure ankle and calf circumference and leg length for fit. Brand per insurance coverage. Requesting 3 pair. Early, Sara E, NP  Active   furosemide  (LASIX ) 20 MG tablet [id number]  Take 1 tablet (20 mg total) by mouth 2 (two) times daily.  Patient taking differently: Take 20 mg by mouth 2 (two) times daily. Patient is taking as needed   Tysinger, David S, PA-C  Active   levothyroxine  (SYNTHROID ) 125 MCG tablet [id number]  Take 1 tablet (125 mcg total) by mouth daily. Early, Sara E, NP  Active   Multiple Vitamin (MULTIVITAMIN WITH MINERALS) TABS tablet [id number]  Take 1 tablet by mouth daily. [provider]  Active Self  OVER THE COUNTER MEDICATION [id number]  Take by mouth daily. Omega XL  Patient is taking 2 gel caps daily [provider]  Active   OVER THE COUNTER MEDICATION [id number]  Take by mouth daily. Osteo Bi Flex patient is taking 2 tablets daily [provider]  Active   potassium chloride  (KLOR-CON ) 10 MEQ tablet [id number]  Take 1 tablet (10 mEq total) by mouth 2 (two) times daily.  Patient not taking: Reported on 05/07/2024   Bulah Alm RAMAN, PA-C  Active   rivaroxaban  (XARELTO ) 20 MG TABS tablet [id number]  Take 1 tablet (20 mg total) by mouth daily with supper. Early, Sara E, NP  Active   valsartan  (DIOVAN ) 160 MG tablet [id number]  TAKE 1 TABLET BY MOUTH EVERY DAY Early, Camie BRAVO, NP  Active   Vitamin D , Ergocalciferol , (DRISDOL ) 1.25 MG (50000 UNIT) CAPS capsule [id number]  Take 1 capsule (50,000 Units total) by mouth every 7 (seven) days.  Patient not taking: Reported on 05/07/2024   Early, Camie BRAVO, NP  Active             Recommendation:   Specialty provider follow-up with Myriam Zebedee CROME, OT Department:[facility]  SERVICES as directed for lymphedema therapy  Participate in the PREP (provider referral exercise program)   Follow Up Plan:   Telephone follow up appointment date/time:  Thursday, October 16 at 9:30 AM  Clayborne Ly RN BSN CCM [place]  [facility], [facility] Nurse Care Coordinator  Direct Dial: [phone] Website: [email or web address]

## 2024-05-26 NOTE — Patient Instructions (Signed)
 Visit Information  Thank you for taking time to visit with me today. Please don't hesitate to contact me if I can be of assistance to you before our next scheduled appointment.  Your next care management appointment is by telephone on Thursday, October 16 at 09:30 AM  Please call the care guide team at [phone] if you need to cancel, schedule, or reschedule an appointment.   Please call [phone]-TALK (toll free, 24 hour hotline) if you are experiencing a Mental Health or Behavioral Health Crisis or need someone to talk to.  Clayborne Ly RN BSN CCM [place]  [facility], [facility] Nurse Care Coordinator  Direct Dial: [phone] Website: [email or web address]

## 2024-05-27 ENCOUNTER — Other Ambulatory Visit: Payer: Self-pay | Admitting: Medical

## 2024-05-27 DIAGNOSIS — H353132 Nonexudative age-related macular degeneration, bilateral, intermediate dry stage: Secondary | ICD-10-CM | POA: Diagnosis not present

## 2024-05-27 DIAGNOSIS — H43813 Vitreous degeneration, bilateral: Secondary | ICD-10-CM | POA: Diagnosis not present

## 2024-05-27 DIAGNOSIS — I10 Essential (primary) hypertension: Secondary | ICD-10-CM

## 2024-05-27 DIAGNOSIS — H35373 Puckering of macula, bilateral: Secondary | ICD-10-CM | POA: Diagnosis not present

## 2024-05-27 NOTE — Telephone Encounter (Signed)
   This was discontinued by Catheline

## 2024-05-28 ENCOUNTER — Encounter: Payer: Self-pay | Admitting: Occupational Therapy

## 2024-05-31 ENCOUNTER — Ambulatory Visit: Admitting: Physical Therapy

## 2024-05-31 ENCOUNTER — Ambulatory Visit: Admitting: Occupational Therapy

## 2024-06-02 ENCOUNTER — Ambulatory Visit: Admitting: Occupational Therapy

## 2024-06-02 ENCOUNTER — Ambulatory Visit: Admitting: Physical Therapy

## 2024-06-03 ENCOUNTER — Other Ambulatory Visit (INDEPENDENT_AMBULATORY_CARE_PROVIDER_SITE_OTHER)

## 2024-06-03 DIAGNOSIS — I10 Essential (primary) hypertension: Secondary | ICD-10-CM

## 2024-06-03 NOTE — Progress Notes (Signed)
 06/03/2024 Name: Natasha [name] MRN: [id number] DOB: 06-19-1951  Chief Complaint  Patient presents with   Medication Management    Natasha [name] is a 73 y.o. year old female who presented for a telephone visit.   They were referred to the pharmacist by the [facility] Complex Case Management program for assistance in managing complex medication management.    Subjective:  Care Team: Primary Care Provider: Oris Camie BRAVO, NP ; Next Scheduled Visit: 06/07/24  Medication Access/Adherence  Current Pharmacy:  [facility] [phone] [place] [place], [place] - [address] [place] [address] Phone: [phone] Fax: [phone]  [id number] [place] - [address] [place] [phone] Phone: [phone] Fax: [phone]   Patient reports affordability concerns with their medications: No  Patient reports access/transportation concerns to their pharmacy: Yes  Patient reports adherence concerns with their medications:  Yes  admits she does not take all meds as prescribed and sometimes skips days, has been out of Vit D rx   Hypertension:  Current medications: Valsartan  160mg  daily Medications previously tried: Amlodipine  (pt had still been taking until she ran out)  Patient does not have a validated, automated, upper arm home BP cuff Current blood pressure readings readings: Not able to check  Patient denies hypotensive s/sx including dizziness, lightheadedness.  Patient denies hypertensive symptoms including headache, chest pain, shortness of breath   Medication Management:  Current adherence strategy: None  Patient reports Poor adherence to medications  Patient reports the following barriers to [facility]: [facility], not wanting to take so many pills  Patient would like to use a pharmacy that offers free delivery. She also is not sure if she still needs potassium or rx vit D (has been taking OTC 2000 units  daily after running out of rx - reports she did not realize [facility])  Reports she is taking OTC meds that she believes are not on her med list, wants to bring in to office on Monday so they can be added    Objective:  Lab Results  Component Value Date   HGBA1C 5.5 03/04/2024    Lab Results  Component Value Date   CREATININE 0.96 03/04/2024   BUN 13 03/04/2024   NA 144 03/04/2024   K 4.4 03/04/2024   CL 106 03/04/2024   CO2 18 (L) 03/04/2024    Lab Results  Component Value Date   CHOL 161 11/26/2022   HDL 83 11/26/2022   LDLCALC 67 11/26/2022   TRIG 52 11/26/2022   CHOLHDL 2.1 10/11/2022    Medications Reviewed Today     Reviewed by Lionell Jon DEL, RPH (Pharmacist) on 06/03/24 at 1243  Med List Status: <None>   Medication Order Taking? Sig Documenting Provider Last Dose Status Informant  acetaminophen  (TYLENOL ) 500 MG tablet [id number]  Take 1 tablet (500 mg total) by mouth every 6 (six) hours as needed.  Patient not taking: Reported on 05/07/2024   Charlyn Sora, MD  Active            Med Note DELILA, ADAM D   Thu Mar 13, 2023  3:16 PM) Prn   acetaminophen  (TYLENOL ) 650 MG CR tablet [id number]  Take 650 mg by mouth every 8 (eight) hours as needed for pain.  Patient taking differently: Take 650 mg by mouth every 8 (eight) hours as needed for pain. Patient is taking 1 tablet twice daily  [provider]  Active   AMBULATORY JESSE SCHLOSSMAN MEDICATION [id number]  Motorized Scooter based on insurance coverage. Early, Sara E, NP  Active    Patient not taking:   Discontinued 06/03/24 1106 (Completed Course)   Elastic Bandages & Supports (MEDICAL COMPRESSION STOCKINGS) MISC [id number]  Thigh high compression stockings for venous insufficiency and lymphedema. Please measure ankle, calf, and thigh circumference and leg length for fit. Brand per insurance. Requesting one pair. Early, Sara E, NP  Active   Elastic Bandages & Supports (MEDICAL  COMPRESSION STOCKINGS) MISC [id number]  30-40 mmHg knee length compression stockings for venous insufficiency and lymphedema. Please measure ankle and calf circumference and leg length for fit. Brand per insurance coverage. Requesting 3 pair. Early, Sara E, NP  Active   furosemide  (LASIX ) 20 MG tablet [id number] Yes Take 1 tablet (20 mg total) by mouth 2 (two) times daily. Tysinger, Alm RAMAN, PA-C  Active   levothyroxine  (SYNTHROID ) 125 MCG tablet [id number] Yes Take 1 tablet (125 mcg total) by mouth daily. Early, Sara E, NP  Active   Multiple Vitamin (MULTIVITAMIN WITH MINERALS) TABS tablet [id number] Yes Take 1 tablet by mouth daily. [provider]  Active Self  OVER THE COUNTER MEDICATION [id number]  Take by mouth daily. Omega XL  Patient is taking 2 gel caps daily [provider]  Active   OVER THE COUNTER MEDICATION [id number]  Take by mouth daily. Osteo Bi Flex patient is taking 2 tablets daily [provider]  Active   potassium chloride  (KLOR-CON ) 10 MEQ tablet [id number] Yes Take 1 tablet (10 mEq total) by mouth 2 (two) times daily. Tysinger, Alm RAMAN, PA-C  Active   rivaroxaban  (XARELTO ) 20 MG TABS tablet [id number] Yes Take 1 tablet (20 mg total) by mouth daily with supper. Early, Sara E, NP  Active   valsartan  (DIOVAN ) 160 MG tablet [id number] Yes TAKE 1 TABLET BY MOUTH EVERY DAY Early, Sara E, NP  Active   Vitamin D , Ergocalciferol , (DRISDOL ) 1.25 MG (50000 UNIT) CAPS capsule [id number]  Take 1 capsule (50,000 Units total) by mouth every 7 (seven) days.  Patient not taking: Reported on 05/07/2024   Oris Camie BRAVO, NP  Active               Assessment/Plan:   Hypertension: - Currently uncontrolled - Reviewed long term cardiovascular and renal outcomes of uncontrolled blood pressure - Reviewed appropriate blood pressure monitoring technique and reviewed goal blood pressure. Recommended to check home blood pressure and heart rate daily. -The patients has a diagnosis  of hypertension and it is medically necessary for them to have access to a home device to monitor blood pressure.  The patient does not have readily available insurance access to a device and cannot afford to purchase a device at this time.  The patient has been counseled that they do not need to continue to receive services from [facility] to receive a device.  The patient will be given a device free of charge. - Recommend to continue current medication therapy. May need to consider adding amlodipine  back if BP elevated at office (pt thinks her swelling was due to her lymphedema and not the med)      Medication Management: - Currently strategy insufficient to maintain appropriate adherence to prescribed medication regimen - Suggested use of weekly pill box to organize medications -Discussed [facility] services including free delivery. Patient would like to switch there for free delivery. Agree to hold off on sending any rx there today as  patient meets with PCP Monday and may have med changes. Will update preferred pharmacy list. -Made note on upcoming PCP visit of desired lab work. Patient wants Vit D and potassium rechecked to determine what rx/strength she should be on . -Patient also plans to discuss prescribing of incontinence supplies with PCP, forewarned that those usually are not covered and if prescribed, would need to go to a DME store.     Follow Up Plan: 4 weeks  Jon VEAR Lindau, PharmD Clinical Pharmacist [phone]

## 2024-06-04 ENCOUNTER — Telehealth: Payer: Self-pay

## 2024-06-04 NOTE — Patient Outreach (Signed)
 Complex Care Management Care Guide Note  06/04/2024 Name: Natasha [name] MRN: [id number] DOB: August 29, 1951  Natasha [name] is a 73 y.o. year old female who is a primary care patient of Early, Camie BRAVO, NP and is actively engaged with the care management team. I reached out to Natasha [name] by phone today to assist with re-scheduling  with the BSW.  Follow up plan: Unsuccessful telephone outreach attempt made. A HIPAA compliant phone message was left for the patient providing contact information and requesting a return call.  Shereen Gin [facility]  Population Health VBCI Assistant Direct Dial: [phone]  Fax: [phone] Website: [email or web address]

## 2024-06-07 ENCOUNTER — Ambulatory Visit: Admitting: Occupational Therapy

## 2024-06-07 ENCOUNTER — Ambulatory Visit: Admitting: Physical Therapy

## 2024-06-07 ENCOUNTER — Encounter: Payer: Self-pay | Admitting: Nurse Practitioner

## 2024-06-07 ENCOUNTER — Ambulatory Visit: Admitting: Nurse Practitioner

## 2024-06-07 VITALS — BP 130/80 | HR 62 | Wt 320.0 lb

## 2024-06-07 DIAGNOSIS — W19XXXA Unspecified fall, initial encounter: Secondary | ICD-10-CM

## 2024-06-07 DIAGNOSIS — R29898 Other symptoms and signs involving the musculoskeletal system: Secondary | ICD-10-CM

## 2024-06-07 DIAGNOSIS — I890 Lymphedema, not elsewhere classified: Secondary | ICD-10-CM

## 2024-06-07 DIAGNOSIS — N3281 Overactive bladder: Secondary | ICD-10-CM

## 2024-06-07 DIAGNOSIS — Z23 Encounter for immunization: Secondary | ICD-10-CM

## 2024-06-07 DIAGNOSIS — E538 Deficiency of other specified B group vitamins: Secondary | ICD-10-CM | POA: Diagnosis not present

## 2024-06-07 DIAGNOSIS — E890 Postprocedural hypothyroidism: Secondary | ICD-10-CM

## 2024-06-07 DIAGNOSIS — M25561 Pain in right knee: Secondary | ICD-10-CM

## 2024-06-07 DIAGNOSIS — I10 Essential (primary) hypertension: Secondary | ICD-10-CM | POA: Diagnosis not present

## 2024-06-07 DIAGNOSIS — G4733 Obstructive sleep apnea (adult) (pediatric): Secondary | ICD-10-CM

## 2024-06-07 DIAGNOSIS — R32 Unspecified urinary incontinence: Secondary | ICD-10-CM | POA: Diagnosis not present

## 2024-06-07 DIAGNOSIS — E039 Hypothyroidism, unspecified: Secondary | ICD-10-CM

## 2024-06-07 DIAGNOSIS — R7303 Prediabetes: Secondary | ICD-10-CM

## 2024-06-07 DIAGNOSIS — M79604 Pain in right leg: Secondary | ICD-10-CM | POA: Diagnosis not present

## 2024-06-07 DIAGNOSIS — R238 Other skin changes: Secondary | ICD-10-CM

## 2024-06-07 DIAGNOSIS — E66813 Obesity, class 3: Secondary | ICD-10-CM | POA: Diagnosis not present

## 2024-06-07 DIAGNOSIS — R350 Frequency of micturition: Secondary | ICD-10-CM

## 2024-06-07 DIAGNOSIS — M1710 Unilateral primary osteoarthritis, unspecified knee: Secondary | ICD-10-CM

## 2024-06-07 DIAGNOSIS — D229 Melanocytic nevi, unspecified: Secondary | ICD-10-CM | POA: Insufficient documentation

## 2024-06-07 DIAGNOSIS — E559 Vitamin D deficiency, unspecified: Secondary | ICD-10-CM

## 2024-06-07 DIAGNOSIS — N390 Urinary tract infection, site not specified: Secondary | ICD-10-CM

## 2024-06-07 LAB — POCT URINALYSIS DIP (CLINITEK)
Bilirubin, UA: NEGATIVE
Blood, UA: NEGATIVE
Glucose, UA: NEGATIVE mg/dL
Ketones, POC UA: NEGATIVE mg/dL
Leukocytes, UA: NEGATIVE
Nitrite, UA: NEGATIVE
Spec Grav, UA: 1.01 (ref 1.010–1.025)
Urobilinogen, UA: 0.2 U/dL
pH, UA: 7 (ref 5.0–8.0)

## 2024-06-07 LAB — CBC WITH DIFFERENTIAL/PLATELET

## 2024-06-07 MED ORDER — ZEPBOUND 2.5 MG/0.5ML ~~LOC~~ SOAJ: 2.5000 mg | SUBCUTANEOUS | 0 refills | Status: DC

## 2024-06-07 MED ORDER — VALSARTAN 160 MG PO TABS: 160.0000 mg | ORAL_TABLET | Freq: Every day | ORAL | 3 refills | Status: DC

## 2024-06-07 MED ORDER — DICLOFENAC SODIUM 1 % EX GEL: 2.0000 g | Freq: Four times a day (QID) | CUTANEOUS | 1 refills | Status: DC

## 2024-06-07 NOTE — Progress Notes (Addendum)
 " Natasha Doing, DNP, AGNP-c [facility]  [address] [place], [place] [address] [phone]  ESTABLISHED PATIENT- Chronic Health and/or Follow-Up Visit on 06/07/2024  Blood pressure 130/80, pulse 62, weight (!) 320 lb (145.2 kg), SpO2 95%.   HPI: History of Present Illness Natasha [name] is a 73 year old female with macular degeneration and arthritis who presents with knee pain and a recent fall.  She experienced a fall on September 25th, 2025, after her eyes were dilated during an eye exam for macular degeneration. The fall occurred in her garage when her cane slipped on a piece of paper, causing her to fall and have boxes fall on her. She was unable to get up for 40 minutes until a neighbor assisted her. Since the fall, she has experienced increased knee pain, making it difficult to stand up from a chair and causing her knees to wake her up at night due to pain. The pain is in both knees equally, and they 'pop and click'.  She has a history of arthritis and has been receiving treatment at an arthritis center, including a four-week regimen of gel injections. She has been using over-the-counter arthritis medication, specifically a [facility] brand with 650 mg per tablet, but finds it only helps 'a bit'. She is concerned about interactions with her other medications, including Xarelto , which she takes for blood thinning. She also uses a cane and has been attending physical therapy, which she finds beneficial for her lymphedema, noting a 'marked difference' in her legs since starting therapy.  She is concerned about several skin lesions that have changed over the years. She describes having moles that were initially red and have turned darker over the past five years. She has multiple lesions on her body, including her back and thigh, and is worried about the possibility of skin cancer, especially after a conversation with a friend who had skin cancer.  She experiences urinary  incontinence and uses Tena extra strength pads and diapers, which are costly. She is trying pelvic floor exercises to manage her incontinence.  She has a history of OSA with a fairly recent study. She has not started using CPAP for this, but reports ongoing daytime fatigue, brain fog, and weakness. She is open to starting CPAP to help manage her symptoms.   Her past medical history includes a thyroidectomy and parathyroidectomy, which she believes affects her metabolism and weight management. She struggles with weight loss despite dietary efforts, including eating fruits and vegetables and limiting bread intake. She is concerned about her vitamin D  levels, which she believes may be contributing to her joint pain.  All ROS negative with exception of what is listed above.    PHYSICAL EXAM Physical Exam Vitals and nursing note reviewed.  Constitutional:      General: She is not in acute distress.    Appearance: She is obese. She is ill-appearing.  Eyes:     Pupils: Pupils are equal, round, and reactive to light.  Cardiovascular:     Rate and Rhythm: Normal rate and regular rhythm.     Pulses: Normal pulses.     Heart sounds: Normal heart sounds.  Pulmonary:     Effort: Pulmonary effort is normal.     Breath sounds: Wheezing present.  Abdominal:     Palpations: Abdomen is soft.  Musculoskeletal:     Right lower leg: Edema present.     Left lower leg: Edema present.  Skin:    General: Skin is warm and  dry.     Capillary Refill: Capillary refill takes less than 2 seconds.      Neurological:     Mental Status: She is alert and oriented to person, place, and time.     Motor: Weakness present.     Coordination: Coordination abnormal.     Gait: Gait abnormal.     Comments: Significant mobility limitations and deconditioning noted today. Natasha [name] had a very difficult time getting from the chair to a standing position.      PLAN Problem List Items Addressed This Visit     OSA  (obstructive sleep apnea)   Chronic. Not currently managed with CPAP. History of snoring, witnessed apnea, sleep disruption and daytime sleepiness. She has history of refractory hypertension.  She has BMI > 35.  Sleep study recently was positive.   We discussed how sleep apnea can affect various health problems including risks for hypertension, cardiovascular disease, and diabetes.  We also discussed how sleep disruption can increase risks for accident, such as while driving.  Weight loss as a means of improving sleep apnea was also reviewed.  We will plan to send order for CPAP and start Zepbound  for sleep apnea management to help with this chronic problem.       Relevant Medications   tirzepatide  (ZEPBOUND ) 2.5 MG/0.5ML Pen   Hypothyroidism, postsurgical   Thyroid  and parathyroid  removal affecting metabolism and weight management. Current thyroid  hormone replacement therapy in place. Discussed importance of monitoring calcium and vitamin D  levels. - Monitor thyroid  hormone levels regularly - Ensure adequate calcium and vitamin D  intake      Relevant Orders   TSH (Completed)   Parathyroid  hormone, intact (no Ca) (Completed)   T4, free (Completed)   OAB (overactive bladder)   Chronic urinary incontinence requiring use of diapers. Financial burden due to cost of supplies. Interest in pelvic floor physical therapy. Discussed use of washable pads to reduce costs and potential benefits of pelvic floor exercises. - Submit prescription for incontinence supplies to insurance - Discuss potential benefits of pelvic floor physical therapy - Encourage use of washable pads to reduce costs      Incontinence of urine in female   Chronic urinary incontinence requiring use of diapers. Financial burden due to cost of supplies. Interest in pelvic floor physical therapy. Discussed use of washable pads to reduce costs and potential benefits of pelvic floor exercises. - Submit prescription for incontinence  supplies to insurance - Discuss potential benefits of pelvic floor physical therapy - Encourage use of washable pads to reduce costs      Arthritis of knee   Chronic bilateral knee pain exacerbated by recent fall, severe, affecting mobility and sleep. Previous treatments, including Tylenol  Arthritis and gel injections, were ineffective. Concerns about medication interactions due to blood thinner use. Discussed alternative pain relief methods, including anecdotal support and topical medications for pain relief. - Prescribe Voltaren  gel for topical pain relief - Encourage continued use of Tylenol  Arthritis as needed - Discuss alternative pain relief methods, including avocado pit in alcohol solution, bio freeze, and or/icy hot.       Lymphedema   Chronic lymphedema in both lower extremities, with significant improvement in one leg due to wrapping and physical therapy. Financial constraints limit frequency of professional wrapping sessions. Daughter assists with wrapping at home. - Continue weekly professional wrapping sessions - Encourage daughter to assist with wrapping at home       Obesity, Class III, BMI 40-49.9 (morbid obesity) (HCC)   Obesity  contributing to knee pain and mobility issues. Difficulty losing weight due to thyroidectomy and parathyroidectomy. Interest in weight loss medication Zepbound , which may be covered by insurance. Discussed potential side effects, including constipation, and the importance of hydration. - Submit prior authorization for Zepbound  - Discuss dietary habits and encourage healthy eating - Monitor weight and adjust treatment plan as needed      Relevant Medications   tirzepatide  (ZEPBOUND ) 2.5 MG/0.5ML Pen   Other Relevant Orders   Hemoglobin A1c (Completed)   CBC with Differential/Platelet (Completed)   Comprehensive metabolic panel with GFR (Completed)   VITAMIN D  25 Hydroxy (Vit-D Deficiency, Fractures) (Completed)   TSH (Completed)    Parathyroid  hormone, intact (no Ca) (Completed)   T4, free (Completed)   Vitamin B12 (Completed)   Fall - Primary   Recent fall resulting in right elbow contusion. Mobility and gait are significantly impaired. I am concerned for her ability to continue at this pace and strongly recommend therapy services to help with strengthening and gait training. Given the level of pain she is experiencing, we may need to consider water therapy to help reduce the weight on her joints.       Pain in both lower extremities   Related to arthritis and significant swelling with lymphedema. Severe deconditioning is present with limitations in mobility present. Strongly encourage physical therapy for management. Consider use of wheelchair when leaving the home to aid in mobility and reduce pain.       Other skin changes   Multiple melanocytic nevi and seborrheic keratoses. Concern about potential skin cancer. - Refer to dermatology for further evaluation      Vitamin D  deficiency   Relevant Orders   VITAMIN D  25 Hydroxy (Vit-D Deficiency, Fractures) (Completed)   B12 deficiency   Relevant Orders   Vitamin B12 (Completed)   Prediabetes   Relevant Medications   tirzepatide  (ZEPBOUND ) 2.5 MG/0.5ML Pen   Other Relevant Orders   Hemoglobin A1c (Completed)   CBC with Differential/Platelet (Completed)   Comprehensive metabolic panel with GFR (Completed)   Recurrent urinary tract infection   Other Visit Diagnoses       Acute pain of both knees       Relevant Medications   diclofenac  Sodium (VOLTAREN  ARTHRITIS PAIN) 1 % GEL     Frequent urination       Relevant Orders   POCT URINALYSIS DIP (CLINITEK) (Completed)     Need for influenza vaccination       Relevant Orders   Flu vaccine HIGH DOSE PF(Fluzone Trivalent) (Completed)     Weakness of both lower extremities       Relevant Medications   tirzepatide  (ZEPBOUND ) 2.5 MG/0.5ML Pen     Primary hypertension       Relevant Medications   valsartan   (DIOVAN ) 160 MG tablet     Acquired hypothyroidism           Because diapers Tena pads (x-l)    Return in about 4 months (around 10/08/2024) for Med Management 45.  Natasha Bleu Minerd, DNP, AGNP-c Time: 61 minutes, >50% spent counseling, care coordination, chart review, and documentation.  .time  "

## 2024-06-07 NOTE — Patient Instructions (Addendum)
 Soak avocado seed into rubbing alcohol for 2 days then take the solution and apply it to any areas that hurt. Ice your knees twice a day for 20 minutes at a time to help with inflammation. Apply voltaren  gel once a day to the knees to help with inflammation and pain.  Continue to take the tylenol  arthritis for pain, as well.   I have sent in for the Zepbound to see if we can get coverage for this. This will need a prior authorization. Our team will complete this as soon as possible and let you know if you have coverage.   I would like you to see dermatology about the changes in your moles.   I will send in the orders for the briefs and pads and we will see if we can get this covered.   Managing Overactive Bladder (OAB) What is OAB? Overactive bladder means you feel a sudden, strong urge to urinate that is hard to control. You might: ? Go to the bathroom often ? Feel urgency (need to rush to the bathroom) ? Sometimes leak urine (urge incontinence) ? Wake up at night to urinate  Lifestyle and Behavior Tips ? Bladder Training Try to wait a little longer between bathroom trips, even if you feel the urge Slowly increase the time between voids to train your bladder ? Timed Voiding Go to the bathroom on a schedule (for example, every 2-3 hours) even if you don't feel the urge Helps prevent accidents ? Pelvic Floor Exercises (Kegels) Strengthen the muscles that help hold urine Squeeze the pelvic floor muscles for 5 seconds, then relax for 5 seconds -- repeat 10 times, 3 times per day A physical therapist can help teach you ? Watch Your Fluids Drink enough fluid, but don't overdo it Limit drinking 2 hours before bedtime Avoid caffeine, alcohol, and carbonated drinks if they worsen symptoms ? Maintain a Healthy Weight Extra weight can put pressure on the bladder ? Quit Smoking Smoking irritates the bladder and makes coughing worse, which can lead to leaking ? Prevent Constipation Eat  fiber and drink water to keep regular, since constipation can make bladder symptoms worse  Foods and Drinks That May Irritate the Bladder Try to limit: ?? Coffee and caffeinated tea ?? Alcohol ?? Citrus juices (orange, grapefruit) ?? Spicy foods ?? Artificial sweeteners ?? Tomato products ?? Chocolate  When to See Your Doctor Call your doctor if you have: ?? Pain or burning when you urinate ?? Blood in your urine ?? Difficulty starting your urine stream ?? Unexplained weight loss ?? Symptoms getting worse despite lifestyle changes  Overactive bladder is very common and can be managed! ? Talk to your doctor about medications or bladder therapies if needed ? Don't be embarrassed -- help is available!

## 2024-06-08 ENCOUNTER — Telehealth: Payer: Self-pay

## 2024-06-08 ENCOUNTER — Other Ambulatory Visit (HOSPITAL_COMMUNITY): Payer: Self-pay

## 2024-06-08 LAB — CBC WITH DIFFERENTIAL/PLATELET
Basos: 1 %
EOS (ABSOLUTE): 0.1 x10E3/uL (ref 0.0–0.2)
Eos: 2 %
Hematocrit: 39.8 % (ref 34.0–46.6)
Hemoglobin: 12.5 g/dL (ref 11.1–15.9)
Immature Granulocytes: 0 %
Immature Granulocytes: 0 x10E3/uL (ref 0.0–0.1)
Lymphs: 31 %
MCH: 29 pg (ref 26.6–33.0)
MCHC: 31.4 g/dL — AB (ref 31.5–35.7)
MCV: 92 fL (ref 79–97)
Monocytes Absolute: 0.1 x10E3/uL (ref 0.0–0.4)
Monocytes Absolute: 0.6 x10E3/uL (ref 0.1–0.9)
Monocytes: 10 %
Neutrophils Absolute: 1.8 x10E3/uL (ref 0.7–3.1)
Neutrophils Absolute: 3.2 x10E3/uL (ref 1.4–7.0)
Neutrophils: 56 %
Platelets: 258 x10E3/uL (ref 150–450)
RBC: 4.31 x10E6/uL (ref 3.77–5.28)
RDW: 13.8 % (ref 11.7–15.4)
WBC: 5.7 x10E3/uL (ref 3.4–10.8)

## 2024-06-08 LAB — COMPREHENSIVE METABOLIC PANEL WITH GFR
ALT: 11 IU/L (ref 0–32)
AST: 12 IU/L (ref 0–40)
Albumin: 4.2 g/dL (ref 3.8–4.8)
Alkaline Phosphatase: 114 IU/L (ref 49–135)
BUN/Creatinine Ratio: 15 (ref 12–28)
BUN: 11 mg/dL (ref 8–27)
Bilirubin Total: 0.6 mg/dL (ref 0.0–1.2)
CO2: 21 mmol/L (ref 20–29)
Calcium: 9.5 mg/dL (ref 8.7–10.3)
Chloride: 103 mmol/L (ref 96–106)
Creatinine, Ser: 0.71 mg/dL (ref 0.57–1.00)
Globulin, Total: 3 g/dL (ref 1.5–4.5)
Glucose: 76 mg/dL (ref 70–99)
Potassium: 4.2 mmol/L (ref 3.5–5.2)
Sodium: 140 mmol/L (ref 134–144)
Total Protein: 7.2 g/dL (ref 6.0–8.5)
eGFR: 90 mL/min/1.73 (ref >59)

## 2024-06-08 LAB — VITAMIN D 25 HYDROXY (VIT D DEFICIENCY, FRACTURES): Vit D, 25-Hydroxy: 29.8 ng/mL — AB (ref 30.0–100.0)

## 2024-06-08 LAB — TSH: TSH: 1 u[IU]/mL (ref 0.450–4.500)

## 2024-06-08 LAB — T4, FREE: Free T4: 1.72 ng/dL (ref 0.82–1.77)

## 2024-06-08 LAB — HEMOGLOBIN A1C
Est. average glucose Bld gHb Est-mCnc: 117 mg/dL
Hgb A1c MFr Bld: 5.7 % — AB (ref 4.8–5.6)

## 2024-06-08 LAB — PARATHYROID HORMONE, INTACT (NO CA): PTH: 50 pg/mL (ref 15–65)

## 2024-06-08 LAB — VITAMIN B12: Vitamin B-12: 511 pg/mL (ref 232–1245)

## 2024-06-08 NOTE — Telephone Encounter (Signed)
 Pharmacy Patient Advocate Encounter   Received notification from Onbase that prior authorization for Zepbound 2.5MG /0.5ML pen-injectors  is required/requested.   Insurance verification completed.   The patient is insured through [employer].   Per test claim: PA required; PA submitted to above mentioned insurance via Latent Key/confirmation #/[id number]  Status is pending

## 2024-06-09 ENCOUNTER — Ambulatory Visit

## 2024-06-09 ENCOUNTER — Ambulatory Visit: Payer: Self-pay | Admitting: Nurse Practitioner

## 2024-06-09 ENCOUNTER — Ambulatory Visit: Attending: Nurse Practitioner | Admitting: Occupational Therapy

## 2024-06-09 ENCOUNTER — Other Ambulatory Visit (HOSPITAL_COMMUNITY): Payer: Self-pay

## 2024-06-09 DIAGNOSIS — R2689 Other abnormalities of gait and mobility: Secondary | ICD-10-CM | POA: Diagnosis not present

## 2024-06-09 DIAGNOSIS — M25561 Pain in right knee: Secondary | ICD-10-CM | POA: Diagnosis not present

## 2024-06-09 DIAGNOSIS — M6281 Muscle weakness (generalized): Secondary | ICD-10-CM

## 2024-06-09 DIAGNOSIS — Z9989 Dependence on other enabling machines and devices: Secondary | ICD-10-CM

## 2024-06-09 DIAGNOSIS — G8929 Other chronic pain: Secondary | ICD-10-CM | POA: Insufficient documentation

## 2024-06-09 DIAGNOSIS — W19XXXA Unspecified fall, initial encounter: Secondary | ICD-10-CM

## 2024-06-09 DIAGNOSIS — R260 Ataxic gait: Secondary | ICD-10-CM | POA: Diagnosis not present

## 2024-06-09 DIAGNOSIS — R262 Difficulty in walking, not elsewhere classified: Secondary | ICD-10-CM

## 2024-06-09 DIAGNOSIS — E66813 Obesity, class 3: Secondary | ICD-10-CM

## 2024-06-09 DIAGNOSIS — R0602 Shortness of breath: Secondary | ICD-10-CM

## 2024-06-09 DIAGNOSIS — R29898 Other symptoms and signs involving the musculoskeletal system: Secondary | ICD-10-CM

## 2024-06-09 DIAGNOSIS — M79604 Pain in right leg: Secondary | ICD-10-CM

## 2024-06-09 DIAGNOSIS — M1710 Unilateral primary osteoarthritis, unspecified knee: Secondary | ICD-10-CM

## 2024-06-09 DIAGNOSIS — M25562 Pain in left knee: Secondary | ICD-10-CM | POA: Diagnosis not present

## 2024-06-09 DIAGNOSIS — I890 Lymphedema, not elsewhere classified: Secondary | ICD-10-CM | POA: Insufficient documentation

## 2024-06-09 DIAGNOSIS — Z7409 Other reduced mobility: Secondary | ICD-10-CM

## 2024-06-09 DIAGNOSIS — Z9181 History of falling: Secondary | ICD-10-CM
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
## 2024-06-09 NOTE — Telephone Encounter (Signed)
 Pharmacy Patient Advocate Encounter  Received notification from [employer] that Prior Authorization for Zepbound 2.5MG /0.5ML pen-injectors  has been APPROVED from 1.1.25 to 12.31.26. Ran test claim, Copay is $64.00. This test claim was processed through [facility]- copay amounts may vary at other pharmacies due to pharmacy/plan contracts, or as the patient moves through the different stages of their insurance plan.   PA #/Case ID/Reference #: [id number]

## 2024-06-09 NOTE — Therapy (Signed)
 OUTPATIENT OCCUPATIONAL THERAPY TREATMENT NOTE   BILATERAL LOWER EXTREMITY/ BLQ LYMPHEDEMA  Patient Name: Natasha [name] MRN: [id number] DOB:09-08-1950, 73 y.o., female Today's Date: 06/09/2024  REPORTING PERIOD:  END OF SESSION:  OT End of Session - 06/09/24 1313     Visit Number 17    Number of Visits 36    Date for Recertification  08/24/24    OT Start Time 0114    OT Stop Time 0208    OT Time Calculation (min) 54 min    Activity Tolerance Patient tolerated treatment well;No increased pain    Behavior During Therapy [facility] for tasks assessed/performed             Past Medical History:  Diagnosis Date   Abdominal spasms 12/14/2020   Acute pain of right shoulder 01/31/2023   Acute pulmonary embolism (HCC) 08/21/2019   Bacterial conjunctivitis of both eyes 12/14/2020   Bilateral leg edema 02/10/2020   Bradycardia    Breast cancer screening by mammogram 11/24/2020   Candida infection 10/11/2022   CARCINOMA, THYROID  GLAND, PAPILLARY 05/04/2008   Stage 2, 8/09: thyroidectomy for 2.7cm papillary adenocarcinoma (t2 n0 mo) 9/09: I-131 rx, 108 mci 05/10: tg is neg (ab neg) , total body scan is neg   Chronic right-sided low back pain with right-sided sciatica 11/24/2020   Colon cancer screening 11/24/2020   Colon polyps    Complication of anesthesia    trouble with airway   Cough 12/25/2007   Qualifier: Diagnosis of  By: Germaine LPN, Megan     RNCPI-80 08/19/2019   Decreased ROM of neck 01/31/2023   Diverticulosis    DVT (deep venous thrombosis) (HCC)    Edema 12/22/2008   Encounter to establish care 11/24/2020   Groin pain, chronic, right 11/24/2020   Health care maintenance 11/26/2022   Hip pain 12/22/2020   History of 2019 novel coronavirus disease (COVID-19) 11/09/2019   HYPERTENSION 12/25/2007   HYPOTHYROIDISM, POSTSURGICAL 05/04/2008   LEG PAIN, LEFT 12/09/2008   Low TSH level 12/10/2022   Lumbago with sciatica, right side 01/08/2021   Memory loss due to  medical condition 11/09/2019   Muscle weakness    Nausea and vomiting 05/23/2008   Qualifier: Diagnosis of  By: Kassie MD, Alyce LABOR   Formatting of this note might be different from the original. Qualifier: Diagnosis of  By: Kassie MD, Sean A   Neck pain 01/31/2023   Non-recurrent acute suppurative otitis media of left ear without spontaneous rupture of tympanic membrane 02/17/2023   Numbness and tingling    Obesity, morbid, BMI 50 or higher (HCC) 08/24/2019   OSA (obstructive sleep apnea) 06/15/2013   CPAP   Paresthesia 01/04/2020   Peripheral edema 12/22/2008   Qualifier: Diagnosis of  By: Delford, MD, CODY Maude Dunnings    Pneumonia due to COVID-19 virus    Pulmonary embolism (HCC)    Pulmonary embolism (HCC) 08/23/2019   Right lower quadrant pain 11/24/2020   Sciatica of left side 12/04/2011   Skin sensation disturbance 12/05/2008   Qualifier: Diagnosis of  By: Inocencio MD, Berwyn LABOR Deal of this note might be different from the original. Qualifier: Diagnosis of  By: Inocencio MD, Valerie A   Snoring 12/10/2022   Spasm of muscle of lower back 12/14/2020   Upper back pain 01/31/2023   Vaginal candidiasis 03/16/2021   Weakness 01/04/2020   Past Surgical History:  Procedure Laterality Date   ABDOMINAL HYSTERECTOMY     due to bleeding, ovaries remain  ANKLE SURGERY Right    CERVICAL FUSION     x 2   COLONOSCOPY WITH PROPOFOL  N/A 08/08/2015   Procedure: COLONOSCOPY WITH PROPOFOL ;  Surgeon: Renaye Sous, MD;  Location: [facility];  Service: Endoscopy;  Laterality: N/A;   KNEE SURGERY Left    TOTAL THYROIDECTOMY     Patient Active Problem List   Diagnosis Date Noted   Fall 06/07/2024   Numerous skin moles 06/07/2024   Pain in both lower extremities 06/07/2024   Anemia 03/04/2024   Chronic venous insufficiency 09/10/2023   Weakness of both lower extremities 09/10/2023   Atrophic vaginitis 05/14/2023   Pelvic floor dysfunction 05/14/2023   Pancreatic insufficiency  05/14/2023   Arthritis of knee 03/24/2023   At risk for fall due to comorbid condition 03/10/2023   Mood changes 02/17/2023   Recurrent urinary tract infection 01/31/2023   Walker as ambulation aid 01/31/2023   B12 deficiency 11/26/2022   Other fatigue 11/26/2022   Prediabetes 11/26/2022   Obesity, Class III, BMI 40-49.9 (morbid obesity) (HCC) 11/26/2022   Dermatofibroma 10/11/2022   Fatty liver 04/20/2021   Incontinence of urine in female 04/20/2021   Vitamin D  deficiency 03/16/2021   Constipation 11/24/2020   Gastroesophageal reflux disease 11/24/2020   OAB (overactive bladder) 11/24/2020   Aortic atherosclerosis 11/24/2020   Right-sided heart failure (HCC) 11/24/2020   History of pulmonary embolism 02/10/2020   Lymphedema 02/10/2020   Mobility impaired 01/04/2020   Muscle weakness (generalized) 11/09/2019   Pulmonary nodule 11/09/2019   OSA (obstructive sleep apnea) 06/15/2013   Allergic rhinitis 01/21/2009   History of papillary adenocarcinoma of thyroid  05/04/2008   Hypothyroidism, postsurgical 05/04/2008   Essential hypertension 12/25/2007   SOB (shortness of breath) on exertion 12/25/2007    PCP: Camie Doing, NP  REFERRING PROVIDER: Camie Doing, NP  REFERRING DIAG: I89.0  THERAPY DIAG:  Lymphedema, not elsewhere classified  Rationale for Evaluation and Treatment: Rehabilitation  ONSET DATE: >15 years  SUBJECTIVE:                                                                                                                                                                                          SUBJECTIVE STATEMENT: Ms. Treloar presents to OT in manual transport wc for LE care. Pt missed last session due to a fall in her garage. She reports her knees are very sore again and effect of injections has disipated.  PERTINENT HISTORY:  Hypothyroidism, postsurgical Essential hypertension Dyspnea OSA (obstructive sleep apnea) Obesity, morbid, BMI 50 or higher  (HCC) Muscle weakness (generalized) Gait abnormality History of pulmonary embolism Bilateral lower extremity edema Chronic right-sided low back pain with  right-sided sciatica Right-sided heart failure (HCC) Hip pain Incontinence of urine in female Dermatofibroma Morbid obesity (HCC) BMI 50.0-59.9, adult (HCC) SOB (shortness of breath) on exertion Other fatigue Prediabetes Decreased ROM of neck Upper back pain Acute pain of right shoulder Recurrent urinary tract infection Urine frequency Walker as ambulation aid Ambulatory dysfunction Mood changes At risk for fall due to comorbid condition Arthritis of knee Hypothyroidism Chronic venous insufficiency Mobility impaired Weakness of both lower extremities Gait instability Lymphedema BMI 45.0-49.9, adult (HCC)  PAIN:  Are you having pain? Yes, B knees: NPRS scale: /10 Pain location: seated and at rest Pain description: creaky crackly, tight, heavy, sore, tender. With and without weight bearing Aggravating factors: standing, walking, weight bearing Relieving factors: walking  PRECAUTIONS: Other: LYMPHEDEMA PRECAUTIONS: prediabetic, hypothyroid, Fall Risk  RED FLAGS: Urinary incontinence; numbness and tingling in fingers, hands and toes   WEIGHT BEARING RESTRICTIONS: No  FALLS:  Has patient fallen in last 6 months? No  LIVING ENVIRONMENT: Lives with: lives with their daughter and  granddaughter Lives in: House/apartment Stairs: Yes; Internal: 14 steps; on right going up and External: garage 4 steps steps; can reach both Has following equipment at home: Walker - 4 wheeled, shower chair, and elevated toilet seat  OCCUPATION: retired Careers information officer professor  LEISURE: concerts, writing and producing plays  HAND DOMINANCE: right   PRIOR LEVEL OF FUNCTION: Independent  PATIENT GOALS: walk unassisted; lose weight   OBJECTIVE: Note: Objective measures were completed at Evaluation unless otherwise  noted.  COGNITION:  Overall cognitive status: Within functional limits for tasks assessed   OBSERVATIONS / OTHER ASSESSMENTS:   POSTURE: head forward, slight shoulder protraction  LE ROM: Limited at hips knees and ankles 2/2 body habitus and skin approximation  LE MMT: generalized weakness  LYMPHEDEMA ASSESSMENTS:   BLE COMPARATIVE LIMB VOLUMETRICS: Initial 02/17/24  LANDMARK RIGHT   R LEG (A-D) 6530.2 ml  R THIGH (E-G) ml  R FULL LIMB (A-G) ml  Limb Volume differential (LVD)  %  Volume change since initial %  Volume change overall V  (Blank rows = not tested)  LANDMARK LEFT  L LEG (A-D) 6601.0  L  THIGH (E-G) ml  L  FULL LIMB (A-G) ml  Limb Volume differential (LVD)  1.08% L>R  Volume change since initial %  Volume change overall %  (Blank rows = not tested)    RLE COMPARATIVE LIMB VOLUMETRICS: 9th visit 03/31/24   LANDMARK RIGHT   R LEG (A-D) 4941.4 ml  R THIGH (E-G) ml  R FULL LIMB (A-G) ml  Limb Volume differential (LVD)  %  Volume change since initial R LEG volume DECREASED by 24.3% since commencing OT for CDT on 02/17/24   Volume change overall V  (Blank rows = not tested)    SKIN/TISSUE INTEGRITY: : Moderate, Stage  II, Bilateral Lower Extremity Lymphedema 2/2 CVI,  Obesity, and Mm weakness  Skin  Description Hyper-Keratosis Peau d' Orange Shiny Tight Fibrotic/ Indurated Fatty Hard Spongy/ boggy   x   x R>L x x x   Skin dry Flaky WNL Macerated   mildly      Color Redness Varicosities Blanching Hemosiderin Stain Mottled        x   Odor Malodorous Yeast Fungal infection  WNL      x   Temperature Warm Cool wnl    x     Pitting Edema   1+ 2+ 3+ 4+ Non-pitting   x  Girth Symmetrical Asymmetrical                   Distribution    R>L toes to groin, bilaterally    Stemmer Sign Positive Negative   +    Lymphorrhea History Of:  Present Absent     x    Wounds History Of Present Absent Venous Arterial Pressure Sheer   denies  x         Signs of Infection Redness Warmth Erythema Acute Swelling Drainage Borders                    Sensation Light Touch Deep pressure Hypersensitivity   In tact Impaired In tact Impaired Absent Impaired   x  x  x     Nails WNL   Fungus nail dystrophy   TBA     Hair Growth Symmetrical Asymmetrical   TB Ax    Skin Creases Base of toes  Ankles   Base of Fingers knees       Abdominal pannus Thigh Lobules  Face/neck   x x  x       GAIT: Pt transported to clinic in transport wheelchair Distance walked: Pt able to transfer out of wc and walk 4-5 steps using 2 wheeled walker to Rx bed with extra time (modified independent).  Assistive device utilized: Pt able to lift legs onto Rx bed, one at a time, using hands to actively assist Level of assistance: Modified independence Comments: Transfers  and bed mobility take an inordinate amount of time  LYMPHEDEMA LIFE IMPACT SCALE (LLIS): Initial: 86.76% (The extent to which LE-related problems impacted your life over the past week.)                                                                                                                           TREATMENT THIS DATE: RLE/RLQ MLD Knee length, multilayer Compression wrapping using gradient techniques Pt/ CG edu   PATIENT EDUCATION:  Continued Pt/ CG edu for lymphedema self care home program throughout session. Topics include outcome of comparative limb volumetrics- starting limb volume differentials (LVDs), technology and gradient techniques used for short stretch, multilayer compression wrapping, simple self-MLD, therapeutic lymphatic pumping exercises, skin/nail care, LE precautions, compression garment recommendations and specifications, wear and care schedule and compression garment donning / doffing w assistive devices. Discussed progress towards all OT goals since commencing CDT. Discussed detrimental impact of obesity on lower and upper extremity lymphedema over time.  Reviewed OT goals for lymphedema care with Pt and discussed progress to date.  All questions answered to the Pt's satisfaction. Good return. Person educated: Patient and family Education method: Explanation, Demonstration, and Handouts Education comprehension: verbalized understanding, returned demonstration, verbal cues required, and needs further education  LYMPHEDEMA SELF-CARE HOME PROGRAM: BLE lymphatic pumping there ex- 1 set of 10 each element, in order. Hold 5. 2 x daily 2. Daily, short stretch, thigh length, multilayer compression bandages during Intensive Phase  CDT 3. During self-Management Phase fit with appropriate compression garments and/ or devices 3. Daily skin care with low ph lotion matching skin ph 4. Daily simple self MLD   ASSESSMENT:  CLINICAL IMPRESSION:Pt continues to have a difficult time controlling lower extremity swelling due to chronic arthritis inflammation in bilateral knees. We're hopeful that recent knee injections will provide some symptom relief. RLE swelling is decreased significantly compared with LLE. Continued RLE/ RLQ MLD as established without increased pain. Re applied multilayer wraps from base of toes to popliteal fossa as established. Pt tolerated all treatment modalities without increased pain. Fit RLE compression garment alternative ASAP. Pt states she has to reduce  Rx frequency to 1 x weekly due to transportation difficulties.  Cont as per POC.  (02/12/24 Initial OT Eval : NASTASIA KAGE is a 73 yo female presenting with moderate, stage  II, bilateral lower extremity lymphedema 2/2 CVI,  obesity, and mm weakness. She will benefit from skilled Occupational Therapy to reduce limb swelling and associated pain, to limit progression and infection risk. BLE lymphedema limits functional performance in all occupational domains, including functional mobility and ambulation,  basic and instrumental ADLs, productive activities, leisure pursuits, social  participation and quality of life. This patient will benefit from modified Intensive and Self Management Phase CDT . In addition to MLD, ther ex, skin care and compression bandaging below the knees, Pt will be fitted with custom knee length compression stockings paired with off the shelf , Capri length compression leggings. If insurance benefits allow, she may undergo a trial in the clinic with the advanced Flexitouch device in an effort to reduce discomfort and reduce infection risk. Without skilled OT Li-lymphedema will worsen over time and further functional decline is expected.  Ms Urda is unable to reach feet and distal legs to apply multilayer , gradient compression wraps during the Intensive Phase of CDT.  She understands this limitation and agrees to  arrange for a caregiver to assist her daily with compression wrapping between OT visits. With a caregiver to assist her with Intensive Phase compression her prognosis is fair. Without CG assistance her prognosis is poor.)  OBJECTIVE IMPAIRMENTS: decreased activity tolerance, decreased balance, decreased endurance, decreased knowledge of condition, decreased knowledge of use of DME, decreased mobility, difficulty walking, decreased ROM, decreased strength, increased edema, impaired UE functional use, postural dysfunction, obesity, pain, and chronic leg swelling.   ACTIVITY LIMITATIONS: ACTIVITY LIMITATIONS: Mobility and functional ambulation limitations:  abnormal gait pattern, difficulty walking, carrying, lifting, bending, sitting, standing, squatting, stairs, transfers, and bed mobility Basic and instrumental ADLs (reaching feet and distal legs to groom nails, inspect skin, apply lotion, bathe lower body, difficulty with LB dressing, including fitting LB clothing, shoes and socks, impaired sleeping, meal prep, standing to cook, driving, shopping, yard work, house work Paediatric nurse activities: work related activities requiring extended standing,  walking, sitting; caring for others Social participation in the community, socializing with others, LE affects body image  Leisure pursuits requiring extended standing, walking, sitting  PERSONAL FACTORS: Fitness, Time since onset of injury/illness/exacerbation, and 3+ comorbidities: Obesity, arthritis, OSA are also affecting patient's functional outcome.   REHAB POTENTIAL: Fair with daily caregiver assistance with gradient compression wrapping. Without assistance prognosis is poor as Pt is unable to apply wraps independently  EVALUATION COMPLEXITY: Moderate   GOALS: Goals reviewed with patient? Yes   SHORT TERM GOALS: Target date: 4th OT Rx visit  Pt will demonstrate understanding of lymphedema precautions and prevention strategies with modified independence using a  printed reference to identify at least 5 precautions and discussing how s/he may implement them into daily life to reduce risk of progression with modified assistance Baseline: max a Goal status:GOAL MET   2.  With Max caregiver assistance Pt will be able to apply multilayer, knee length, compression wraps using gradient techniques to decrease limb volume, to limit infection risk, and to limit lymphedema progression.  Given this patient's Intake score of tbd % on the Lymphedema Life Impact Scale (LLIS), patient will experience a reduction of at least 5% in her perceived level of functional impairment resulting from lymphedema to improve functional performance and quality of life (QOL). Baseline: Dependent Goal status: 03/31/24 GOAL MET, but caregiver has limited availability to assist     LONG TERM GOALS: Target date: 05/13/24 Given this patient's Intake score of 86.76 % on the Lymphedema Life Impact Scale (LLIS), patient will experience a reduction of at least 10% in her perceived level of functional impairment resulting from lymphedema to improve functional performance and quality of life (QOL).Baseline: tbd Baseline: max  a Goal status: PROGRESSING     2.  With modified independence (extra time and assistive devices) Pt will be able to don and doff appropriate compression garments and/or devices to control BLE lymphedema and to limit progression.  Baseline: Dependent Goal status: PROGRESSING   3. Pt will achieve at least a 10% volume reductions bilaterally below the knees to return limb to more typical size and shape, to limit infection risk and LE progression, to decrease pain, to improve function, and to improve body image and QOL. Baseline: Dependent Goal status:PROGRESSING. Volumetrics reveal R LEG volume DECREASED by 24.3% since commencing OT for CDT on 02/17/24. This value meets and exceeds the initial 10% volume reduction goal for the RLE. Goal elevated to 25% bilaterally.    4. Pt will achieve and sustain at least 85% compliance with all LE self-care home program components throughout CDT, including modified simple self-MLD, daily skin care and inspection, lymphatic pumping the ex and appropriate compression to limit lymphedema progression and to limit further functional decline. Baseline: Dependent. (Note: W/out daily assistance with donning/ doffing compression  , prognosis is poor.) Goal status:PROGRESSING.Unfortunately there is limited caregiver follow thru , so compliance is limited for compression and attendance.    PLAN:  OT FREQUENCY: 2x/week and PRN Reduce OT too 1 x weekly per Pt request due to transportation  OT DURATION: other: 18 weeks and PRN  PLANNED INTERVENTIONS:  Complete Decongestive Therapy (CDT): Manual Lymphatic Drainage (MLD) , Skin care, ther ex, gradient compression 97110-Therapeutic exercises, 97530- Therapeutic activity, 97535- Self Care, [id number]- Manual therapy, Patient/Family education, Manual lymph drainage, Compression bandaging, DME instructions, and trial with advanced, sequential, pneumatic, compression device (Tactile Medical Flexitouch) Pt will arrange for a caregiver  to assist her daily with compression wrapping between OT visits.   PLAN FOR NEXT SESSION:  Pt/ caregiver edu Knee length, Multilayer compression wraps to R leg only MLD Fit RLE CircAid asap.  Zebedee Dec, MS, OTR/L, CLT-LANA  '
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
## 2024-06-10 DIAGNOSIS — F411 Generalized anxiety disorder: Secondary | ICD-10-CM | POA: Diagnosis not present

## 2024-06-11 ENCOUNTER — Telehealth: Payer: Self-pay

## 2024-06-11 DIAGNOSIS — I890 Lymphedema, not elsewhere classified: Secondary | ICD-10-CM | POA: Diagnosis not present

## 2024-06-11 NOTE — Telephone Encounter (Signed)
 Left voicemail for patient to call back.

## 2024-06-11 NOTE — Telephone Encounter (Signed)
 Pt. Stated she is worried about taking Zepbound because one of the first warnings on the medication stats if you have had thyroid  cancer or thyroid  issues not to take the medication. She stats she had some kind of thyroid  cancer back in 2010 she thought and has thyroid  issues as well.    Copied from CRM [id number]. Topic: Clinical - Medication Question >> Jun 11, 2024  9:39 AM Everette C wrote: Reason for CRM: The patient would like to speak with a member of clinical staff regarding their previous history of thyroid  concerns and their recent prescription of tirzepatide (ZEPBOUND) prior to picking up their prescription. Please contact further if/when possible

## 2024-06-11 NOTE — Telephone Encounter (Signed)
 Please let Natasha [name] know that the medication is contraindicated for patients who have had or their family has had Medullary Thyroid  Cancer or Multiple Endocrine Neoplasia Syndrome type 2 (MEN2).   I have that her thyroid  cancer was papillary, which is not contraindicated for use.  In some studies the medication caused tumor growth of the thyroid  tissue in rodents tested. There is no correlation with use in humans at this time, but they still recommend monitoring.   They recommend close monitoring for all patients for possible thyroid  lumps or changes. We monitor at each visit to make sure no changes are present.   If she does not feel comfortable taking the medication we can certainly discontinue. All GLP-1 medications have the same warning.

## 2024-06-14 ENCOUNTER — Ambulatory Visit: Admitting: Physical Therapy

## 2024-06-14 ENCOUNTER — Ambulatory Visit: Admitting: Occupational Therapy

## 2024-06-14 ENCOUNTER — Telehealth: Payer: Self-pay

## 2024-06-14 NOTE — Telephone Encounter (Signed)
 Called pt. Had to LM.    Copied from CRM #[id number]. Topic: General - Other >> Jun 11, 2024  4:00 PM Amy B wrote: Reason for CRM: Patient returned call.  Note from Camie Doing was read to patient.  She still has question and requests a call back to discuss.

## 2024-06-16 ENCOUNTER — Encounter: Payer: Self-pay | Admitting: Nurse Practitioner

## 2024-06-16 ENCOUNTER — Ambulatory Visit: Admitting: Physical Therapy

## 2024-06-16 ENCOUNTER — Encounter: Payer: Self-pay | Admitting: Occupational Therapy

## 2024-06-16 ENCOUNTER — Ambulatory Visit: Admitting: Occupational Therapy

## 2024-06-16 DIAGNOSIS — R2689 Other abnormalities of gait and mobility: Secondary | ICD-10-CM

## 2024-06-16 DIAGNOSIS — I890 Lymphedema, not elsewhere classified: Secondary | ICD-10-CM | POA: Diagnosis not present

## 2024-06-16 DIAGNOSIS — R262 Difficulty in walking, not elsewhere classified: Secondary | ICD-10-CM | POA: Diagnosis not present

## 2024-06-16 DIAGNOSIS — R238 Other skin changes: Secondary | ICD-10-CM

## 2024-06-16 DIAGNOSIS — M6281 Muscle weakness (generalized): Secondary | ICD-10-CM

## 2024-06-16 DIAGNOSIS — M25562 Pain in left knee: Secondary | ICD-10-CM | POA: Diagnosis not present

## 2024-06-16 DIAGNOSIS — G8929 Other chronic pain: Secondary | ICD-10-CM

## 2024-06-16 DIAGNOSIS — R260 Ataxic gait: Secondary | ICD-10-CM | POA: Diagnosis not present

## 2024-06-16 DIAGNOSIS — M25561 Pain in right knee: Secondary | ICD-10-CM | POA: Diagnosis not present

## 2024-06-16 HISTORY — DX: Other skin changes: R23.8

## 2024-06-16 NOTE — Assessment & Plan Note (Signed)
 Chronic bilateral knee pain exacerbated by recent fall, severe, affecting mobility and sleep. Previous treatments, including Tylenol  Arthritis and gel injections, were ineffective. Concerns about medication interactions due to blood thinner use. Discussed alternative pain relief methods, including anecdotal support and topical medications for pain relief. - Prescribe Voltaren  gel for topical pain relief - Encourage continued use of Tylenol  Arthritis as needed - Discuss alternative pain relief methods, including avocado pit in alcohol solution, bio freeze, and or/icy hot.

## 2024-06-16 NOTE — Assessment & Plan Note (Signed)
 Related to arthritis and significant swelling with lymphedema. Severe deconditioning is present with limitations in mobility present. Strongly encourage physical therapy for management. Consider use of wheelchair when leaving the home to aid in mobility and reduce pain.

## 2024-06-16 NOTE — Assessment & Plan Note (Signed)
 Chronic urinary incontinence requiring use of diapers. Financial burden due to cost of supplies. Interest in pelvic floor physical therapy. Discussed use of washable pads to reduce costs and potential benefits of pelvic floor exercises. - Submit prescription for incontinence supplies to insurance - Discuss potential benefits of pelvic floor physical therapy - Encourage use of washable pads to reduce costs

## 2024-06-16 NOTE — Assessment & Plan Note (Signed)
 Thyroid  and parathyroid removal affecting metabolism and weight management. Current thyroid  hormone replacement therapy in place. Discussed importance of monitoring calcium and vitamin D  levels. - Monitor thyroid  hormone levels regularly - Ensure adequate calcium and vitamin D  intake

## 2024-06-16 NOTE — Assessment & Plan Note (Signed)
 Recent fall resulting in right elbow contusion. Mobility and gait are significantly impaired. I am concerned for her ability to continue at this pace and strongly recommend therapy services to help with strengthening and gait training. Given the level of pain she is experiencing, we may need to consider water therapy to help reduce the weight on her joints.

## 2024-06-16 NOTE — Assessment & Plan Note (Addendum)
>>  ASSESSMENT AND PLAN FOR INCONTINENCE OF URINE IN FEMALE WRITTEN ON 06/16/2024  7:54 AM BY Rayven Hendrickson E, NP  Chronic urinary incontinence requiring use of diapers. Financial burden due to cost of supplies. Interest in pelvic floor physical therapy. Discussed use of washable pads to reduce costs and potential benefits of pelvic floor exercises. - Submit prescription for incontinence supplies to insurance - Discuss potential benefits of pelvic floor physical therapy - Encourage use of washable pads to reduce costs   >>ASSESSMENT AND PLAN FOR OAB (OVERACTIVE BLADDER) WRITTEN ON 06/16/2024  7:54 AM BY Annina Piotrowski E, NP  Chronic urinary incontinence requiring use of diapers. Financial burden due to cost of supplies. Interest in pelvic floor physical therapy. Discussed use of washable pads to reduce costs and potential benefits of pelvic floor exercises. - Submit prescription for incontinence supplies to insurance - Discuss potential benefits of pelvic floor physical therapy - Encourage use of washable pads to reduce costs

## 2024-06-16 NOTE — Assessment & Plan Note (Signed)
 Obesity contributing to knee pain and mobility issues. Difficulty losing weight due to thyroidectomy and parathyroidectomy. Interest in weight loss medication Zepbound, which may be covered by insurance. Discussed potential side effects, including constipation, and the importance of hydration. - Submit prior authorization for Zepbound - Discuss dietary habits and encourage healthy eating - Monitor weight and adjust treatment plan as needed

## 2024-06-16 NOTE — Assessment & Plan Note (Signed)
 Multiple melanocytic nevi and seborrheic keratoses. Concern about potential skin cancer. - Refer to dermatology for further evaluation

## 2024-06-16 NOTE — Therapy (Signed)
 OUTPATIENT OCCUPATIONAL THERAPY TREATMENT NOTE   BILATERAL LOWER EXTREMITY/ BLQ LYMPHEDEMA  Patient Name: Natasha [name] MRN: [id number] DOB:Aug 31, 1951, 73 y.o., female Today's Date: 06/16/2024  REPORTING PERIOD:  END OF SESSION:  OT End of Session - 06/16/24 1318     Visit Number 18    Number of Visits 36    Date for Recertification  08/24/24    OT Start Time 0118    OT Stop Time 0208    OT Time Calculation (min) 50 min    Activity Tolerance Patient tolerated treatment well;No increased pain    Behavior During Therapy [facility] for tasks assessed/performed             Past Medical History:  Diagnosis Date   Abdominal spasms 12/14/2020   Acute pain of right shoulder 01/31/2023   Acute pulmonary embolism (HCC) 08/21/2019   Bacterial conjunctivitis of both eyes 12/14/2020   Bilateral leg edema 02/10/2020   Bradycardia    Breast cancer screening by mammogram 11/24/2020   Candida infection 10/11/2022   CARCINOMA, THYROID  GLAND, PAPILLARY 05/04/2008   Stage 2, 8/09: thyroidectomy for 2.7cm papillary adenocarcinoma (t2 n0 mo) 9/09: I-131 rx, 108 mci 05/10: tg is neg (ab neg) , total body scan is neg   Chronic right-sided low back pain with right-sided sciatica 11/24/2020   Colon cancer screening 11/24/2020   Colon polyps    Complication of anesthesia    trouble with airway   Cough 12/25/2007   Qualifier: Diagnosis of  By: Germaine LPN, Megan     RNCPI-80 08/19/2019   Decreased ROM of neck 01/31/2023   Diverticulosis    DVT (deep venous thrombosis) (HCC)    Edema 12/22/2008   Encounter to establish care 11/24/2020   Groin pain, chronic, right 11/24/2020   Health care maintenance 11/26/2022   Hip pain 12/22/2020   History of 2019 novel coronavirus disease (COVID-19) 11/09/2019   HYPERTENSION 12/25/2007   HYPOTHYROIDISM, POSTSURGICAL 05/04/2008   LEG PAIN, LEFT 12/09/2008   Low TSH level 12/10/2022   Lumbago with sciatica, right side 01/08/2021   Memory loss due to  medical condition 11/09/2019   Muscle weakness    Nausea and vomiting 05/23/2008   Qualifier: Diagnosis of  By: Kassie MD, Alyce LABOR   Formatting of this note might be different from the original. Qualifier: Diagnosis of  By: Kassie MD, Sean A   Neck pain 01/31/2023   Non-recurrent acute suppurative otitis media of left ear without spontaneous rupture of tympanic membrane 02/17/2023   Numbness and tingling    Obesity, morbid, BMI 50 or higher (HCC) 08/24/2019   OSA (obstructive sleep apnea) 06/15/2013   CPAP   Other fatigue 11/26/2022   Paresthesia 01/04/2020   Peripheral edema 12/22/2008   Qualifier: Diagnosis of  By: Delford, MD, CODY Maude Dunnings    Pneumonia due to COVID-19 virus    Pulmonary embolism (HCC)    Pulmonary embolism (HCC) 08/23/2019   Right lower quadrant pain 11/24/2020   Sciatica of left side 12/04/2011   Skin sensation disturbance 12/05/2008   Qualifier: Diagnosis of  By: Inocencio MD, Berwyn LABOR Deal of this note might be different from the original. Qualifier: Diagnosis of  By: Inocencio MD, Valerie A   Snoring 12/10/2022   Spasm of muscle of lower back 12/14/2020   Upper back pain 01/31/2023   Vaginal candidiasis 03/16/2021   Weakness 01/04/2020   Past Surgical History:  Procedure Laterality Date   ABDOMINAL HYSTERECTOMY     due  to bleeding, ovaries remain   ANKLE SURGERY Right    CERVICAL FUSION     x 2   COLONOSCOPY WITH PROPOFOL  N/A 08/08/2015   Procedure: COLONOSCOPY WITH PROPOFOL ;  Surgeon: Renaye Sous, MD;  Location: [facility];  Service: Endoscopy;  Laterality: N/A;   KNEE SURGERY Left    TOTAL THYROIDECTOMY     Patient Active Problem List   Diagnosis Date Noted   Other skin changes 06/16/2024   Fall 06/07/2024   Numerous skin moles 06/07/2024   Pain in both lower extremities 06/07/2024   Anemia 03/04/2024   Chronic venous insufficiency 09/10/2023   Atrophic vaginitis 05/14/2023   Pelvic floor dysfunction 05/14/2023   Pancreatic  insufficiency 05/14/2023   Arthritis of knee 03/24/2023   At risk for fall due to comorbid condition 03/10/2023   Mood changes 02/17/2023   Recurrent urinary tract infection 01/31/2023   Walker as ambulation aid 01/31/2023   B12 deficiency 11/26/2022   Prediabetes 11/26/2022   Obesity, Class III, BMI 40-49.9 (morbid obesity) (HCC) 11/26/2022   Dermatofibroma 10/11/2022   Fatty liver 04/20/2021   Incontinence of urine in female 04/20/2021   Vitamin D  deficiency 03/16/2021   Constipation 11/24/2020   Gastroesophageal reflux disease 11/24/2020   OAB (overactive bladder) 11/24/2020   Aortic atherosclerosis 11/24/2020   Right-sided heart failure (HCC) 11/24/2020   History of pulmonary embolism 02/10/2020   Lymphedema 02/10/2020   Mobility impaired 01/04/2020   Muscle weakness (generalized) 11/09/2019   Pulmonary nodule 11/09/2019   OSA (obstructive sleep apnea) 06/15/2013   Allergic rhinitis 01/21/2009   History of papillary adenocarcinoma of thyroid  05/04/2008   Hypothyroidism, postsurgical 05/04/2008   Essential hypertension 12/25/2007   SOB (shortness of breath) on exertion 12/25/2007    PCP: Camie Doing, NP  REFERRING PROVIDER: Camie Doing, NP  REFERRING DIAG: I89.0  THERAPY DIAG:  Lymphedema, not elsewhere classified  Rationale for Evaluation and Treatment: Rehabilitation  ONSET DATE: >15 years  SUBJECTIVE:                                                                                                                                                                                          SUBJECTIVE STATEMENT: Natasha [name] presents to OT in manual transport wc for LE care. Pt arrives 14 minutes late for a 60 minute session. Pt c/o R hand thumb are hurting,; she rates pain 7/10. My knees always hurt. Pt brings recommended knee high compression leggings to clinic for fitting.  PERTINENT HISTORY:  Hypothyroidism, postsurgical Essential hypertension Dyspnea OSA  (obstructive sleep apnea) Obesity, morbid, BMI 50 or higher (HCC) Muscle weakness (generalized) Gait abnormality History of pulmonary embolism  Bilateral lower extremity edema Chronic right-sided low back pain with right-sided sciatica Right-sided heart failure (HCC) Hip pain Incontinence of urine in female Dermatofibroma Morbid obesity (HCC) BMI 50.0-59.9, adult (HCC) SOB (shortness of breath) on exertion Other fatigue Prediabetes Decreased ROM of neck Upper back pain Acute pain of right shoulder Recurrent urinary tract infection Urine frequency Walker as ambulation aid Ambulatory dysfunction Mood changes At risk for fall due to comorbid condition Arthritis of knee Hypothyroidism Chronic venous insufficiency Mobility impaired Weakness of both lower extremities Gait instability Lymphedema BMI 45.0-49.9, adult (HCC)  PAIN:  Are you having pain? Yes, B knees: NPRS scale: not rated numerically/10 Pain location: seated and at rest Pain description: creaky crackly, tight, heavy, sore, tender. With and without weight bearing Aggravating factors: standing, walking, weight bearing Relieving factors: walking  PRECAUTIONS: Other: LYMPHEDEMA PRECAUTIONS: prediabetic, hypothyroid, Fall Risk  RED FLAGS: Urinary incontinence; numbness and tingling in fingers, hands and toes   WEIGHT BEARING RESTRICTIONS: No  FALLS:  Has patient fallen in last 6 months? No  LIVING ENVIRONMENT: Lives with: lives with their daughter and  granddaughter Lives in: House/apartment Stairs: Yes; Internal: 14 steps; on right going up and External: garage 4 steps steps; can reach both Has following equipment at home: Walker - 4 wheeled, shower chair, and elevated toilet seat  OCCUPATION: retired Careers information officer professor  LEISURE: concerts, writing and producing plays  HAND DOMINANCE: right   PRIOR LEVEL OF FUNCTION: Independent  PATIENT GOALS: walk unassisted; lose weight   OBJECTIVE: Note:  Objective measures were completed at Evaluation unless otherwise noted.  COGNITION:  Overall cognitive status: Within functional limits for tasks assessed   OBSERVATIONS / OTHER ASSESSMENTS:   POSTURE: head forward, slight shoulder protraction  LE ROM: Limited at hips knees and ankles 2/2 body habitus and skin approximation  LE MMT: generalized weakness  LYMPHEDEMA ASSESSMENTS:   BLE COMPARATIVE LIMB VOLUMETRICS: Initial 02/17/24  LANDMARK RIGHT   R LEG (A-D) 6530.2 ml  R THIGH (E-G) ml  R FULL LIMB (A-G) ml  Limb Volume differential (LVD)  %  Volume change since initial %  Volume change overall V  (Blank rows = not tested)  LANDMARK LEFT  L LEG (A-D) 6601.0  L  THIGH (E-G) ml  L  FULL LIMB (A-G) ml  Limb Volume differential (LVD)  1.08% L>R  Volume change since initial %  Volume change overall %  (Blank rows = not tested)    RLE COMPARATIVE LIMB VOLUMETRICS: 9th visit 03/31/24   LANDMARK RIGHT   R LEG (A-D) 4941.4 ml  R THIGH (E-G) ml  R FULL LIMB (A-G) ml  Limb Volume differential (LVD)  %  Volume change since initial R LEG volume DECREASED by 24.3% since commencing OT for CDT on 02/17/24   Volume change overall V  (Blank rows = not tested)    SKIN/TISSUE INTEGRITY: : Moderate, Stage  II, Bilateral Lower Extremity Lymphedema 2/2 CVI,  Obesity, and Mm weakness  Skin  Description Hyper-Keratosis Peau d' Orange Shiny Tight Fibrotic/ Indurated Fatty Hard Spongy/ boggy   x   x R>L x x x   Skin dry Flaky WNL Macerated   mildly      Color Redness Varicosities Blanching Hemosiderin Stain Mottled        x   Odor Malodorous Yeast Fungal infection  WNL      x   Temperature Warm Cool wnl    x     Pitting Edema   1+ 2+ 3+ 4+  Non-pitting   x         Girth Symmetrical Asymmetrical                   Distribution    R>L toes to groin, bilaterally    Stemmer Sign Positive Negative   +    Lymphorrhea History Of:  Present Absent     x    Wounds  History Of Present Absent Venous Arterial Pressure Sheer   denies  x        Signs of Infection Redness Warmth Erythema Acute Swelling Drainage Borders                    Sensation Light Touch Deep pressure Hypersensitivity   In tact Impaired In tact Impaired Absent Impaired   x  x  x     Nails WNL   Fungus nail dystrophy   TBA     Hair Growth Symmetrical Asymmetrical   TB Ax    Skin Creases Base of toes  Ankles   Base of Fingers knees       Abdominal pannus Thigh Lobules  Face/neck   x x  x       GAIT: Pt transported to clinic in transport wheelchair Distance walked: Pt able to transfer out of wc and walk 4-5 steps using 2 wheeled walker to Rx bed with extra time (modified independent).  Assistive device utilized: Pt able to lift legs onto Rx bed, one at a time, using hands to actively assist Level of assistance: Modified independence Comments: Transfers  and bed mobility take an inordinate amount of time  LYMPHEDEMA LIFE IMPACT SCALE (LLIS): Initial: 86.76% (The extent to which LE-related problems impacted your life over the past week.)                                                                                                                           TREATMENT THIS DATE: CircAid fitting to R LEG Pt/ CG edu  PATIENT EDUCATION:  Continued Pt/ CG edu for lymphedema self care home program throughout session. Topics include outcome of comparative limb volumetrics- starting limb volume differentials (LVDs), technology and gradient techniques used for short stretch, multilayer compression wrapping, simple self-MLD, therapeutic lymphatic pumping exercises, skin/nail care, LE precautions, compression garment recommendations and specifications, wear and care schedule and compression garment donning / doffing w assistive devices. Discussed progress towards all OT goals since commencing CDT. Discussed detrimental impact of obesity on lower and upper extremity lymphedema over  time. Reviewed OT goals for lymphedema care with Pt and discussed progress to date.  All questions answered to the Pt's satisfaction. Good return. Person educated: Patient and family Education method: Explanation, Demonstration, and Handouts Education comprehension: verbalized understanding, returned demonstration, verbal cues required, and needs further education  LYMPHEDEMA SELF-CARE HOME PROGRAM: BLE lymphatic pumping there ex- 1 set of 10 each element, in order. Hold 5. 2 x daily 2. Daily, short stretch, thigh length,  multilayer compression bandages during Intensive Phase CDT 3. During self-Management Phase fit with appropriate compression garments and/ or devices (Custom CircAid Juxtafit Essential A-D) 3. Daily skin care with low ph lotion matching skin ph 4. Daily simple self MLD   ASSESSMENT:  CLINICAL IMPRESSION: R LEG swelling is dramatically increased from popliteal fossa to toes today. It is doubtful that compression wraps were used over the last few days. Limb volume reduction in the R leg is not well maintained  with waning CG consistency and decreased Rx frequency. We completed fitting and Pt edu for RLE custom CircAid adjustable compression legging today. Pt unable to don and doff these seated in wc, but she tells me she was able to put the leggin on at home with extra time. She is not able to bend to reach the lower leg to gauge the compression, but she is able to gauge accurately the top half. Pt taught to use the gauge card to apply 30-40 mmHg / ccl 2 compression to the leg. Unfortunately the anklets to not fit today as ankle and foot are too swollen. Foot pieces fit well, but Pt requires max assistance to don these and doff. The R foot does fit inside Pt's shoe after laces are loosened.  We are Pt education provided for wear and care and replacement schedule. Next session we'll commence CDT to the L LEG, including multilayer compression wraps. Cont as per POC.   (02/12/24 Initial  OT Eval : KORAL THADEN is a 73 yo female presenting with moderate, stage  II, bilateral lower extremity lymphedema 2/2 CVI,  obesity, and mm weakness. She will benefit from skilled Occupational Therapy to reduce limb swelling and associated pain, to limit progression and infection risk. BLE lymphedema limits functional performance in all occupational domains, including functional mobility and ambulation,  basic and instrumental ADLs, productive activities, leisure pursuits, social participation and quality of life. This patient will benefit from modified Intensive and Self Management Phase CDT . In addition to MLD, ther ex, skin care and compression bandaging below the knees, Pt will be fitted with custom knee length compression stockings paired with off the shelf , Capri length compression leggings. If insurance benefits allow, she may undergo a trial in the clinic with the advanced Flexitouch device in an effort to reduce discomfort and reduce infection risk. Without skilled OT Li-lymphedema will worsen over time and further functional decline is expected.  Ms Jeancharles is unable to reach feet and distal legs to apply multilayer , gradient compression wraps during the Intensive Phase of CDT.  She understands this limitation and agrees to  arrange for a caregiver to assist her daily with compression wrapping between OT visits. With a caregiver to assist her with Intensive Phase compression her prognosis is fair. Without CG assistance her prognosis is poor.)  OBJECTIVE IMPAIRMENTS: decreased activity tolerance, decreased balance, decreased endurance, decreased knowledge of condition, decreased knowledge of use of DME, decreased mobility, difficulty walking, decreased ROM, decreased strength, increased edema, impaired UE functional use, postural dysfunction, obesity, pain, and chronic leg swelling.   ACTIVITY LIMITATIONS: ACTIVITY LIMITATIONS: Mobility and functional ambulation limitations:  abnormal gait  pattern, difficulty walking, carrying, lifting, bending, sitting, standing, squatting, stairs, transfers, and bed mobility Basic and instrumental ADLs (reaching feet and distal legs to groom nails, inspect skin, apply lotion, bathe lower body, difficulty with LB dressing, including fitting LB clothing, shoes and socks, impaired sleeping, meal prep, standing to cook, driving, shopping, yard work, house work Paediatric nurse activities: work  related activities requiring extended standing, walking, sitting; caring for others Social participation in the community, socializing with others, LE affects body image  Leisure pursuits requiring extended standing, walking, sitting  PERSONAL FACTORS: Fitness, Time since onset of injury/illness/exacerbation, and 3+ comorbidities: Obesity, arthritis, OSA are also affecting patient's functional outcome.   REHAB POTENTIAL: Fair with daily caregiver assistance with gradient compression wrapping. Without assistance prognosis is poor as Pt is unable to apply wraps independently  EVALUATION COMPLEXITY: Moderate   GOALS: Goals reviewed with patient? Yes   SHORT TERM GOALS: Target date: 4th OT Rx visit  Pt will demonstrate understanding of lymphedema precautions and prevention strategies with modified independence using a printed reference to identify at least 5 precautions and discussing how s/he may implement them into daily life to reduce risk of progression with modified assistance Baseline: max a Goal status:GOAL MET   2.  With Max caregiver assistance Pt will be able to apply multilayer, knee length, compression wraps using gradient techniques to decrease limb volume, to limit infection risk, and to limit lymphedema progression.  Given this patient's Intake score of tbd % on the Lymphedema Life Impact Scale (LLIS), patient will experience a reduction of at least 5% in her perceived level of functional impairment resulting from lymphedema to improve functional  performance and quality of life (QOL). Baseline: Dependent Goal status: 03/31/24 GOAL MET, but caregiver has limited availability to assist     LONG TERM GOALS: Target date: 05/13/24 Given this patient's Intake score of 86.76 % on the Lymphedema Life Impact Scale (LLIS), patient will experience a reduction of at least 10% in her perceived level of functional impairment resulting from lymphedema to improve functional performance and quality of life (QOL).Baseline: tbd Baseline: max a Goal status: PROGRESSING     2.  With modified independence (extra time and assistive devices) Pt will be able to don and doff appropriate compression garments and/or devices to control BLE lymphedema and to limit progression.  Baseline: Dependent Goal status: PROGRESSING   3. Pt will achieve at least a 10% volume reductions bilaterally below the knees to return limb to more typical size and shape, to limit infection risk and LE progression, to decrease pain, to improve function, and to improve body image and QOL. Baseline: Dependent Goal status:PROGRESSING. Volumetrics reveal R LEG volume DECREASED by 24.3% since commencing OT for CDT on 02/17/24. This value meets and exceeds the initial 10% volume reduction goal for the RLE. Goal elevated to 25% bilaterally.    4. Pt will achieve and sustain at least 85% compliance with all LE self-care home program components throughout CDT, including modified simple self-MLD, daily skin care and inspection, lymphatic pumping the ex and appropriate compression to limit lymphedema progression and to limit further functional decline. Baseline: Dependent. (Note: W/out daily assistance with donning/ doffing compression  , prognosis is poor.) Goal status:PROGRESSING.Unfortunately there is limited caregiver follow thru , so compliance is limited for compression and attendance.    PLAN:  OT FREQUENCY: 2x/week and PRN Reduce OT too 1 x weekly per Pt request due to transportation  OT  DURATION: other: 18 weeks and PRN  PLANNED INTERVENTIONS:  Complete Decongestive Therapy (CDT): Manual Lymphatic Drainage (MLD) , Skin care, ther ex, gradient compression 97110-Therapeutic exercises, 97530- Therapeutic activity, 97535- Self Care, [id number]- Manual therapy, Patient/Family education, Manual lymph drainage, Compression bandaging, DME instructions, and trial with advanced, sequential, pneumatic, compression device (Tactile Medical Flexitouch) Pt will arrange for a caregiver to assist her daily with compression wrapping  between OT visits.   PLAN FOR NEXT SESSION:  Pt/ caregiver edu Knee length, Multilayer compression wraps to R leg only MLD Fit RLE CircAid ASAP.  Zebedee Dec, MS, OTR/L, CLT-LANA  '

## 2024-06-16 NOTE — Assessment & Plan Note (Signed)
 Chronic lymphedema in both lower extremities, with significant improvement in one leg due to wrapping and physical therapy. Financial constraints limit frequency of professional wrapping sessions. Daughter assists with wrapping at home. - Continue weekly professional wrapping sessions - Encourage daughter to assist with wrapping at home
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
## 2024-06-17 ENCOUNTER — Other Ambulatory Visit: Payer: Self-pay

## 2024-06-17 ENCOUNTER — Telehealth: Payer: Self-pay | Admitting: Cardiology

## 2024-06-17 DIAGNOSIS — F411 Generalized anxiety disorder: Secondary | ICD-10-CM | POA: Diagnosis not present

## 2024-06-17 NOTE — Telephone Encounter (Signed)
 Left the pt a message to call the office back to further discuss concerns.

## 2024-06-17 NOTE — Telephone Encounter (Signed)
 Pt stated she was advised to get a cpap machine and that someone would f/u with her and she never heard anything. She has questions and is requesting a callback. Please advise.

## 2024-06-17 NOTE — Telephone Encounter (Signed)
 Looks like your office 10/2023 tried to call her back with sleep study results and recommendation for CPAP but not sure if she ever got back to y'all... she called us  today with questions.   When she calls us  back we can get her overdue cardiology follow up scheduled and ask her to call your team about CPAP.  Thanks,  Reche GORMAN Finder, NP

## 2024-06-17 NOTE — Patient Instructions (Addendum)
 Visit Information  Thank you for taking time to visit with me today. Please don't hesitate to contact me if I can be of assistance to you before our next scheduled appointment.  Your next care management appointment is by telephone on Thursday, November 6 at 10:30 AM   Please call the care guide team at [phone] if you need to cancel, schedule, or reschedule an appointment.   Please call [phone]-TALK (toll free, 24 hour hotline) if you are experiencing a Mental Health or Behavioral Health Crisis or need someone to talk to.  Clayborne Ly RN BSN CCM [place]  [facility], [facility] Nurse Care Coordinator  Direct Dial: [phone] Website: [email or web address]

## 2024-06-17 NOTE — Patient Outreach (Addendum)
 Complex Care Management   Visit Note  06/17/2024  Name:  Natasha [name] MRN: [id number] DOB: May 28, 1951  Situation: Referral received for Complex Care Management related to Right-Sided Hearted Failure, Chronic Venous Insufficiency, Pelvic Floor dysfunction, Overactive Bladder, recurrent UTI, Obesity, Class III, BMI 40-49.9 (morbid obesity), history of Pulmonary Embolism, Bilateral lymphedema to LE, OSA, Weakness of both lower extremities, Arthritis to both knees. I obtained verbal consent from Patient.  Visit completed with Patient on the phone.  Background:   Past Medical History:  Diagnosis Date   Abdominal spasms 12/14/2020   Acute pain of right shoulder 01/31/2023   Acute pulmonary embolism (HCC) 08/21/2019   Bacterial conjunctivitis of both eyes 12/14/2020   Bilateral leg edema 02/10/2020   Bradycardia    Breast cancer screening by mammogram 11/24/2020   Candida infection 10/11/2022   CARCINOMA, THYROID  GLAND, PAPILLARY 05/04/2008   Stage 2, 8/09: thyroidectomy for 2.7cm papillary adenocarcinoma (t2 n0 mo) 9/09: I-131 rx, 108 mci 05/10: tg is neg (ab neg) , total body scan is neg   Chronic right-sided low back pain with right-sided sciatica 11/24/2020   Colon cancer screening 11/24/2020   Colon polyps    Complication of anesthesia    trouble with airway   Cough 12/25/2007   Qualifier: Diagnosis of  By: Germaine LPN, Megan     RNCPI-80 08/19/2019   Decreased ROM of neck 01/31/2023   Diverticulosis    DVT (deep venous thrombosis) (HCC)    Edema 12/22/2008   Encounter to establish care 11/24/2020   Groin pain, chronic, right 11/24/2020   Health care maintenance 11/26/2022   Hip pain 12/22/2020   History of 2019 novel coronavirus disease (COVID-19) 11/09/2019   HYPERTENSION 12/25/2007   HYPOTHYROIDISM, POSTSURGICAL 05/04/2008   LEG PAIN, LEFT 12/09/2008   Low TSH level 12/10/2022   Lumbago with sciatica, right side 01/08/2021   Memory loss due to medical condition  11/09/2019   Muscle weakness    Nausea and vomiting 05/23/2008   Qualifier: Diagnosis of  By: Kassie MD, Alyce LABOR   Formatting of this note might be different from the original. Qualifier: Diagnosis of  By: Kassie MD, Sean A   Neck pain 01/31/2023   Non-recurrent acute suppurative otitis media of left ear without spontaneous rupture of tympanic membrane 02/17/2023   Numbness and tingling    Obesity, morbid, BMI 50 or higher (HCC) 08/24/2019   OSA (obstructive sleep apnea) 06/15/2013   CPAP   Other fatigue 11/26/2022   Paresthesia 01/04/2020   Peripheral edema 12/22/2008   Qualifier: Diagnosis of  By: Delford, MD, CODY Maude Dunnings    Pneumonia due to COVID-19 virus    Pulmonary embolism (HCC)    Pulmonary embolism (HCC) 08/23/2019   Right lower quadrant pain 11/24/2020   Sciatica of left side 12/04/2011   Skin sensation disturbance 12/05/2008   Qualifier: Diagnosis of  By: Inocencio MD, Berwyn LABOR Deal of this note might be different from the original. Qualifier: Diagnosis of  By: Inocencio MD, [place] LABOR   Snoring 12/10/2022   Spasm of muscle of lower back 12/14/2020   Upper back pain 01/31/2023   Vaginal candidiasis 03/16/2021   Weakness 01/04/2020    Assessment: Patient Reported Symptoms:  Cognitive Cognitive Status: Alert and oriented to person, place, and time, Normal speech and language skills Cognitive/Intellectual Conditions Management [RPT]: None reported or documented in medical history or problem list   Health Maintenance Behaviors: Annual physical exam Health Facilitated by: Rest  Neurological Neurological  Review of Symptoms: No symptoms reported    HEENT HEENT Symptoms Reported: No symptoms reported      Cardiovascular Cardiovascular Symptoms Reported: Swelling in legs or feet Does patient have uncontrolled Hypertension?: No Cardiovascular Management Strategies: Adequate rest, Routine screening, Medical device, Medication therapy Cardiovascular  Self-Management Outcome: 4 (good)  Respiratory Respiratory Symptoms Reported: Alteration in Sleep Pattern; dx: of OSA, not using CPAP Respiratory Management Strategies: Adequate rest, CPAP (needs new CPAP) Respiratory Self-Management Outcome: 3 (uncertain)     Endocrine Endocrine Symptoms Reported: No symptoms reported Is patient diabetic?: Yes Is patient checking blood sugars at home?: No Endocrine Self-Management Outcome: 3 (uncertain)  Gastrointestinal Gastrointestinal Symptoms Reported: No symptoms reported      Genitourinary Genitourinary Symptoms Reported: Incontinence Genitourinary Management Strategies: Incontinence garment/pad Genitourinary Self-Management Outcome: 2 (bad)  Integumentary Integumentary Symptoms Reported: Wound, Skin changes Additional Integumentary Details: bilateral lymphedema Skin Management Strategies: Dressing changes, Routine screening, Adequate rest, Exercise, Medical device Skin Self-Management Outcome: 4 (good)  Musculoskeletal Musculoskelatal Symptoms Reviewed: Difficulty walking, Limited mobility, Unsteady gait, Joint pain, Weakness Musculoskeletal Management Strategies: Medical device, Routine screening, Adequate rest Musculoskeletal Self-Management Outcome: 3 (uncertain) Falls in the past year?: No Number of falls in past year: 1 or less Was there an injury with Fall?: No Fall Risk Category Calculator: 0 Patient Fall Risk Level: Low Fall Risk Patient at Risk for Falls Due to: Impaired balance/gait, Impaired mobility Fall risk Follow up: Education provided, Falls evaluation completed, Falls prevention discussed  Psychosocial Psychosocial Symptoms Reported: Alteration is Sleep Pattern  Determined patient has a dx of OSA and is not using her CPAP machine  Major Change/Loss/Stressor/Fears (CP): Medical condition, self Techniques to Cope with Loss/Stress/Change: Diversional activities Quality of Family Relationships: supportive Do you feel physically  threatened by others?: No    06/17/2024    PHQ2-9 Depression Screening   Lance Galas interest or pleasure in doing things    Feeling down, depressed, or hopeless    PHQ-2 - Total Score    Trouble falling or staying asleep, or sleeping too much    Feeling tired or having Kato Wieczorek energy    Poor appetite or overeating     Feeling bad about yourself - or that you are a failure or have let yourself or your family down    Trouble concentrating on things, such as reading the newspaper or watching television    Moving or speaking so slowly that other people could have noticed.  Or the opposite - being so fidgety or restless that you have been moving around a lot more than usual    Thoughts that you would be better off dead, or hurting yourself in some way    PHQ2-9 Total Score    If you checked off any problems, how difficult have these problems made it for you to do your work, take care of things at home, or get along with other people    Depression Interventions/Treatment      There were no vitals filed for this visit.  Medications Reviewed Today     Reviewed by Morgan Clayborne CROME, RN (Registered Nurse) on 06/17/24 at 1121  Med List Status: <None>   Medication Order Taking? Sig Documenting Provider Last Dose Status Informant  acetaminophen  (TYLENOL ) 650 MG CR tablet [id number]  Take 650 mg by mouth every 8 (eight) hours as needed for pain. [provider]  Active   AMBULATORY JESSE SCHLOSSMAN MEDICATION [id number]  Motorized Scooter based on insurance coverage. Early, Sara E, NP  Active  diclofenac  Sodium (VOLTAREN  ARTHRITIS PAIN) 1 % GEL [id number]  Apply 2 g topically 4 (four) times daily. Early, Sara E, NP  Active   Elastic Bandages & Supports (MEDICAL COMPRESSION STOCKINGS) MISC [id number]  Thigh high compression stockings for venous insufficiency and lymphedema. Please measure ankle, calf, and thigh circumference and leg length for fit. Brand per insurance. Requesting one pair. Early, Sara E,  NP  Active   Elastic Bandages & Supports (MEDICAL COMPRESSION STOCKINGS) MISC [id number]  30-40 mmHg knee length compression stockings for venous insufficiency and lymphedema. Please measure ankle and calf circumference and leg length for fit. Brand per insurance coverage. Requesting 3 pair. Early, Sara E, NP  Active   furosemide  (LASIX ) 20 MG tablet [id number]  Take 1 tablet (20 mg total) by mouth 2 (two) times daily. Tysinger, Alm RAMAN, PA-C  Active   levothyroxine  (SYNTHROID ) 125 MCG tablet [id number]  Take 1 tablet (125 mcg total) by mouth daily. Early, Sara E, NP  Active   magnesium (MAGTAB) 84 MG ( ) TBCR SR tablet [id number]  Take 72 mg by mouth. [provider]  Active   Multiple Vitamin (MULTIVITAMIN WITH MINERALS) TABS tablet [id number]  Take 1 tablet by mouth daily. [provider]  Active Self  OVER THE COUNTER MEDICATION [id number]  Take by mouth daily. Omega XL  Patient is taking 2 gel caps daily [provider]  Active            Med Note DELILA, ADAM D   Mon Jun 07, 2024 11:55 AM) Urolithin a 2000 mg  OVER THE COUNTER MEDICATION [id number]  Take by mouth daily. Osteo Bi Flex patient is taking 2 tablets daily [provider]  Active   OVER THE COUNTER MEDICATION [id number]   [provider]  Active   OVER THE COUNTER MEDICATION [id number]   [provider]  Active   potassium chloride  (KLOR-CON ) 10 MEQ tablet [id number]  Take 1 tablet (10 mEq total) by mouth 2 (two) times daily. Tysinger, Alm RAMAN, PA-C  Active   rivaroxaban  (XARELTO ) 20 MG TABS tablet [id number]  Take 1 tablet (20 mg total) by mouth daily with supper. Early, Sara E, NP  Active   tirzepatide [facility]) 2.5 MG/0.5ML Pen [id number]  Inject 2.5 mg into the skin once a week. After 4 weeks, increase to 5mg . Early, Sara E, NP  Active   valsartan  (DIOVAN ) 160 MG tablet [id number]  Take 1 tablet (160 mg total) by mouth daily. Early, Sara E, NP  Active   Vitamin D , Ergocalciferol ,  (DRISDOL ) 1.25 MG (50000 UNIT) CAPS capsule [id number]  Take 1 capsule (50,000 Units total) by mouth every 7 (seven) days. Early, Sara E, NP  Active             Recommendation:   PCP Follow-up Specialty provider follow-up with Urogynecologist, Cardiologist and Lymphedema PT as directed    Follow Up Plan:   Telephone follow up appointment date/time:  Thursday, November 6 at 10:30 AM Referral to [facility] Marina Boerner RN BSN CCM [place]  [facility], [facility] Nurse Care Coordinator  Direct Dial: [phone] Website: [email or web address]

## 2024-06-18 ENCOUNTER — Other Ambulatory Visit: Payer: Self-pay

## 2024-06-18 DIAGNOSIS — R0602 Shortness of breath: Secondary | ICD-10-CM

## 2024-06-18 DIAGNOSIS — E66813 Obesity, class 3: Secondary | ICD-10-CM

## 2024-06-18 DIAGNOSIS — G4733 Obstructive sleep apnea (adult) (pediatric): Secondary | ICD-10-CM

## 2024-06-18 NOTE — Telephone Encounter (Signed)
 Sent patient a MyChart message to follow up with her PCP regarding the CPAP and that we would call her to get her scheduled for her overdue follow up.

## 2024-06-18 NOTE — Patient Outreach (Signed)
 Complex Care Management Care Guide Note  06/18/2024 Name: LUCRETIA PENDLEY MRN: [id number] DOB: 03-06-1951  [name] is a 73 y.o. year old female who is a primary care patient of Early, Camie BRAVO, NP and is actively engaged with the care management team. I reached out to [name] by phone today to assist with re-scheduling  with the BSW.  Follow up plan: Telephone appointment with complex care management team member scheduled for:  06/25/24  Shereen Gin [facility]  Population Health VBCI Assistant Direct Dial: [phone]  Fax: [phone] Website: [email or web address]

## 2024-06-21 ENCOUNTER — Ambulatory Visit: Admitting: Physical Therapy

## 2024-06-21 ENCOUNTER — Ambulatory Visit: Admitting: Occupational Therapy

## 2024-06-23 ENCOUNTER — Ambulatory Visit: Admitting: Occupational Therapy

## 2024-06-23 ENCOUNTER — Encounter: Payer: Self-pay | Admitting: Occupational Therapy

## 2024-06-23 ENCOUNTER — Ambulatory Visit: Admitting: Physical Therapy

## 2024-06-23 DIAGNOSIS — M6281 Muscle weakness (generalized): Secondary | ICD-10-CM

## 2024-06-23 DIAGNOSIS — I890 Lymphedema, not elsewhere classified: Secondary | ICD-10-CM | POA: Diagnosis not present

## 2024-06-23 DIAGNOSIS — G8929 Other chronic pain: Secondary | ICD-10-CM

## 2024-06-23 DIAGNOSIS — M25562 Pain in left knee: Secondary | ICD-10-CM | POA: Diagnosis not present

## 2024-06-23 DIAGNOSIS — R2689 Other abnormalities of gait and mobility: Secondary | ICD-10-CM

## 2024-06-23 DIAGNOSIS — M25561 Pain in right knee: Secondary | ICD-10-CM | POA: Diagnosis not present

## 2024-06-23 DIAGNOSIS — R260 Ataxic gait: Secondary | ICD-10-CM | POA: Diagnosis not present

## 2024-06-23 DIAGNOSIS — R262 Difficulty in walking, not elsewhere classified: Secondary | ICD-10-CM

## 2024-06-23 NOTE — Therapy (Signed)
 OUTPATIENT OCCUPATIONAL THERAPY TREATMENT NOTE   BILATERAL LOWER EXTREMITY/ BLQ LYMPHEDEMA  Patient Name: Natasha [name] MRN: [id number] DOB:1951/03/30, 73 y.o., female Today's Date: 06/23/2024  REPORTING PERIOD:  END OF SESSION:  OT End of Session - 06/23/24 1307     Visit Number 19    Number of Visits 36    Date for Recertification  08/24/24    OT Start Time 0104    OT Stop Time 0204    OT Time Calculation (min) 60 min    Activity Tolerance Patient tolerated treatment well;No increased pain    Behavior During Therapy [facility] for tasks assessed/performed             Past Medical History:  Diagnosis Date   Abdominal spasms 12/14/2020   Acute pain of right shoulder 01/31/2023   Acute pulmonary embolism (HCC) 08/21/2019   Bacterial conjunctivitis of both eyes 12/14/2020   Bilateral leg edema 02/10/2020   Bradycardia    Breast cancer screening by mammogram 11/24/2020   Candida infection 10/11/2022   CARCINOMA, THYROID  GLAND, PAPILLARY 05/04/2008   Stage 2, 8/09: thyroidectomy for 2.7cm papillary adenocarcinoma (t2 n0 mo) 9/09: I-131 rx, 108 mci 05/10: tg is neg (ab neg) , total body scan is neg   Chronic right-sided low back pain with right-sided sciatica 11/24/2020   Colon cancer screening 11/24/2020   Colon polyps    Complication of anesthesia    trouble with airway   Cough 12/25/2007   Qualifier: Diagnosis of  By: Germaine LPN, Megan     RNCPI-80 08/19/2019   Decreased ROM of neck 01/31/2023   Diverticulosis    DVT (deep venous thrombosis) (HCC)    Edema 12/22/2008   Encounter to establish care 11/24/2020   Groin pain, chronic, right 11/24/2020   Health care maintenance 11/26/2022   Hip pain 12/22/2020   History of 2019 novel coronavirus disease (COVID-19) 11/09/2019   HYPERTENSION 12/25/2007   HYPOTHYROIDISM, POSTSURGICAL 05/04/2008   LEG PAIN, LEFT 12/09/2008   Low TSH level 12/10/2022   Lumbago with sciatica, right side 01/08/2021   Memory loss due to  medical condition 11/09/2019   Muscle weakness    Nausea and vomiting 05/23/2008   Qualifier: Diagnosis of  By: Kassie MD, Alyce LABOR   Formatting of this note might be different from the original. Qualifier: Diagnosis of  By: Kassie MD, Sean A   Neck pain 01/31/2023   Non-recurrent acute suppurative otitis media of left ear without spontaneous rupture of tympanic membrane 02/17/2023   Numbness and tingling    Obesity, morbid, BMI 50 or higher (HCC) 08/24/2019   OSA (obstructive sleep apnea) 06/15/2013   CPAP   Other fatigue 11/26/2022   Paresthesia 01/04/2020   Peripheral edema 12/22/2008   Qualifier: Diagnosis of  By: Delford, MD, CODY Maude Dunnings    Pneumonia due to COVID-19 virus    Pulmonary embolism (HCC)    Pulmonary embolism (HCC) 08/23/2019   Right lower quadrant pain 11/24/2020   Sciatica of left side 12/04/2011   Skin sensation disturbance 12/05/2008   Qualifier: Diagnosis of  By: Inocencio MD, Berwyn LABOR Deal of this note might be different from the original. Qualifier: Diagnosis of  By: Inocencio MD, Valerie A   Snoring 12/10/2022   Spasm of muscle of lower back 12/14/2020   Upper back pain 01/31/2023   Vaginal candidiasis 03/16/2021   Weakness 01/04/2020   Past Surgical History:  Procedure Laterality Date   ABDOMINAL HYSTERECTOMY     due  to bleeding, ovaries remain   ANKLE SURGERY Right    CERVICAL FUSION     x 2   COLONOSCOPY WITH PROPOFOL  N/A 08/08/2015   Procedure: COLONOSCOPY WITH PROPOFOL ;  Surgeon: Renaye Sous, MD;  Location: [facility];  Service: Endoscopy;  Laterality: N/A;   KNEE SURGERY Left    TOTAL THYROIDECTOMY     Patient Active Problem List   Diagnosis Date Noted   Other skin changes 06/16/2024   Fall 06/07/2024   Numerous skin moles 06/07/2024   Pain in both lower extremities 06/07/2024   Anemia 03/04/2024   Chronic venous insufficiency 09/10/2023   Atrophic vaginitis 05/14/2023   Pelvic floor dysfunction 05/14/2023   Pancreatic  insufficiency 05/14/2023   Arthritis of knee 03/24/2023   At risk for fall due to comorbid condition 03/10/2023   Mood changes 02/17/2023   Recurrent urinary tract infection 01/31/2023   Walker as ambulation aid 01/31/2023   B12 deficiency 11/26/2022   Prediabetes 11/26/2022   Obesity, Class III, BMI 40-49.9 (morbid obesity) (HCC) 11/26/2022   Dermatofibroma 10/11/2022   Fatty liver 04/20/2021   Incontinence of urine in female 04/20/2021   Vitamin D  deficiency 03/16/2021   Constipation 11/24/2020   Gastroesophageal reflux disease 11/24/2020   OAB (overactive bladder) 11/24/2020   Aortic atherosclerosis 11/24/2020   Right-sided heart failure (HCC) 11/24/2020   History of pulmonary embolism 02/10/2020   Lymphedema 02/10/2020   Mobility impaired 01/04/2020   Muscle weakness (generalized) 11/09/2019   Pulmonary nodule 11/09/2019   OSA (obstructive sleep apnea) 06/15/2013   Allergic rhinitis 01/21/2009   History of papillary adenocarcinoma of thyroid  05/04/2008   Hypothyroidism, postsurgical 05/04/2008   Essential hypertension 12/25/2007   SOB (shortness of breath) on exertion 12/25/2007    PCP: Camie Doing, NP  REFERRING PROVIDER: Camie Doing, NP  REFERRING DIAG: I89.0  THERAPY DIAG:  Lymphedema, not elsewhere classified  Rationale for Evaluation and Treatment: Rehabilitation  ONSET DATE: >15 years  SUBJECTIVE:                                                                                                                                                                                          SUBJECTIVE STATEMENT: Ms. Carmon presents to OT in manual transport wc for LE care. Pt reports knee pain is 5/10. Pt states she is doing well  with the new compression leggings. They are such a breeze to put on. Pt states she is anxious to reduce swelling in L Leg because it is very heavy and interferes with walking and mobility.   PERTINENT HISTORY:  Hypothyroidism,  postsurgical Essential hypertension Dyspnea OSA (obstructive sleep apnea) Obesity, morbid, BMI  50 or higher (HCC) Muscle weakness (generalized) Gait abnormality History of pulmonary embolism Bilateral lower extremity edema Chronic right-sided low back pain with right-sided sciatica Right-sided heart failure (HCC) Hip pain Incontinence of urine in female Dermatofibroma Morbid obesity (HCC) BMI 50.0-59.9, adult (HCC) SOB (shortness of breath) on exertion Other fatigue Prediabetes Decreased ROM of neck Upper back pain Acute pain of right shoulder Recurrent urinary tract infection Urine frequency Walker as ambulation aid Ambulatory dysfunction Mood changes At risk for fall due to comorbid condition Arthritis of knee Hypothyroidism Chronic venous insufficiency Mobility impaired Weakness of both lower extremities Gait instability Lymphedema BMI 45.0-49.9, adult (HCC)  PAIN:  Are you having pain? Yes, B knees: NPRS scale: 5/10 Pain location: seated and at rest Pain description: creaky crackly, tight, heavy, sore, tender. With and without weight bearing Aggravating factors: standing, walking, weight bearing Relieving factors: walking  PRECAUTIONS: Other: LYMPHEDEMA PRECAUTIONS: prediabetic, hypothyroid, Fall Risk  RED FLAGS: Urinary incontinence; numbness and tingling in fingers, hands and toes   WEIGHT BEARING RESTRICTIONS: No  FALLS:  Has patient fallen in last 6 months? No  LIVING ENVIRONMENT: Lives with: lives with their daughter and  granddaughter Lives in: House/apartment Stairs: Yes; Internal: 14 steps; on right going up and External: garage 4 steps steps; can reach both Has following equipment at home: Walker - 4 wheeled, shower chair, and elevated toilet seat  OCCUPATION: retired Careers information officer professor  LEISURE: concerts, writing and producing plays  HAND DOMINANCE: right   PRIOR LEVEL OF FUNCTION: Independent  PATIENT GOALS: walk unassisted; lose  weight   OBJECTIVE: Note: Objective measures were completed at Evaluation unless otherwise noted.  COGNITION:  Overall cognitive status: Within functional limits for tasks assessed   OBSERVATIONS / OTHER ASSESSMENTS:   POSTURE: head forward, slight shoulder protraction  LE ROM: Limited at hips knees and ankles 2/2 body habitus and skin approximation  LE MMT: generalized weakness  LYMPHEDEMA ASSESSMENTS:   BLE COMPARATIVE LIMB VOLUMETRICS: Initial 02/17/24  LANDMARK RIGHT   R LEG (A-D) 6530.2 ml  R THIGH (E-G) ml  R FULL LIMB (A-G) ml  Limb Volume differential (LVD)  %  Volume change since initial %  Volume change overall V  (Blank rows = not tested)  LANDMARK LEFT  L LEG (A-D) 6601.0  L  THIGH (E-G) ml  L  FULL LIMB (A-G) ml  Limb Volume differential (LVD)  1.08% L>R  Volume change since initial %  Volume change overall %  (Blank rows = not tested)    RLE COMPARATIVE LIMB VOLUMETRICS: 9th visit 03/31/24   LANDMARK RIGHT   R LEG (A-D) 4941.4 ml  R THIGH (E-G) ml  R FULL LIMB (A-G) ml  Limb Volume differential (LVD)  %  Volume change since initial R LEG volume DECREASED by 24.3% since commencing OT for CDT on 02/17/24   Volume change overall V  (Blank rows = not tested)  BLE COMPARATIVE LIMB VOLUMETRICS: 20 th visit- TBA next visit  LANDMARK RIGHT   R LEG (A-D)  ml  R THIGH (E-G) ml  R FULL LIMB (A-G) ml  Limb Volume differential (LVD)  %  Volume change since initial %  Volume change overall V  (Blank rows = not tested)  LANDMARK LEFT  L LEG (A-D)   L  THIGH (E-G) ml  L  FULL LIMB (A-G) ml  Limb Volume differential (LVD)  %  Volume change since initial %  Volume change overall %  (Blank rows = not tested)  SKIN/TISSUE INTEGRITY: : Moderate, Stage  II, Bilateral Lower Extremity Lymphedema 2/2 CVI,  Obesity, and Mm weakness  Skin  Description Hyper-Keratosis Peau d' Orange Shiny Tight Fibrotic/ Indurated Fatty Hard Spongy/ boggy   x   x R>L x  x x   Skin dry Flaky WNL Macerated   mildly      Color Redness Varicosities Blanching Hemosiderin Stain Mottled        x   Odor Malodorous Yeast Fungal infection  WNL      x   Temperature Warm Cool wnl    x     Pitting Edema   1+ 2+ 3+ 4+ Non-pitting   x         Girth Symmetrical Asymmetrical                   Distribution    R>L toes to groin, bilaterally    Stemmer Sign Positive Negative   +    Lymphorrhea History Of:  Present Absent     x    Wounds History Of Present Absent Venous Arterial Pressure Sheer   denies  x        Signs of Infection Redness Warmth Erythema Acute Swelling Drainage Borders                    Sensation Light Touch Deep pressure Hypersensitivity   In tact Impaired In tact Impaired Absent Impaired   x  x  x     Nails WNL   Fungus nail dystrophy   TBA     Hair Growth Symmetrical Asymmetrical   TB Ax    Skin Creases Base of toes  Ankles   Base of Fingers knees       Abdominal pannus Thigh Lobules  Face/neck   x x  x       GAIT: Pt transported to clinic in transport wheelchair Distance walked: Pt able to transfer out of wc and walk 4-5 steps using 2 wheeled walker to Rx bed with extra time (modified independent).  Assistive device utilized: Pt able to lift legs onto Rx bed, one at a time, using hands to actively assist Level of assistance: Modified independence Comments: Transfers  and bed mobility take an inordinate amount of time  LYMPHEDEMA LIFE IMPACT SCALE (LLIS): Initial: 86.76% (The extent to which LE-related problems impacted your life over the past week.)                                                                                                                           TREATMENT THIS DATE: CircAid fitting to R LEG Pt/ CG edu  PATIENT EDUCATION:  Continued Pt/ CG edu for lymphedema self care home program throughout session. Topics include outcome of comparative limb volumetrics- starting limb volume  differentials (LVDs), technology and gradient techniques used for short stretch, multilayer compression wrapping, simple self-MLD, therapeutic lymphatic pumping exercises, skin/nail care, LE precautions, compression garment recommendations and specifications,  wear and care schedule and compression garment donning / doffing w assistive devices. Discussed progress towards all OT goals since commencing CDT. Discussed detrimental impact of obesity on lower and upper extremity lymphedema over time. Reviewed OT goals for lymphedema care with Pt and discussed progress to date.  All questions answered to the Pt's satisfaction. Good return. Person educated: Patient and family Education method: Explanation, Demonstration, and Handouts Education comprehension: verbalized understanding, returned demonstration, verbal cues required, and needs further education  LYMPHEDEMA SELF-CARE HOME PROGRAM: BLE lymphatic pumping there ex- 1 set of 10 each element, in order. Hold 5. 2 x daily 2. Daily, short stretch, thigh length, multilayer compression bandages during Intensive Phase CDT 3. During self-Management Phase fit with appropriate compression garments and/ or devices (Custom CircAid Juxtafit Essential A-D) 3. Daily skin care with low ph lotion matching skin ph 4. Daily simple self MLD   ASSESSMENT:  CLINICAL IMPRESSION: Pt tells me she was able to put the RLE CircAid legging on at home with extra time. Commenced CDT to the L LEG today, including MLD and multilayer compression wraps. Pt has increased tenderness to compression and light touch at distal leg and dorsal L foot. Pt instructed to remove all wraps and report symptoms to her doctor if she experiences heart racing, or atypical SOB.Pt encouraged to keep L Leg wraps in place until this time tomorrow , then re wrapping if she is abl;e to tolerate  If not tolerable Pt encouraged to remove only one layer of wrap at a time until wrap is tolerable. Cont as per  POC.   (02/12/24 Initial OT Eval : LAURICE IGLESIA is a 73 yo female presenting with moderate, stage  II, bilateral lower extremity lymphedema 2/2 CVI,  obesity, and mm weakness. She will benefit from skilled Occupational Therapy to reduce limb swelling and associated pain, to limit progression and infection risk. BLE lymphedema limits functional performance in all occupational domains, including functional mobility and ambulation,  basic and instrumental ADLs, productive activities, leisure pursuits, social participation and quality of life. This patient will benefit from modified Intensive and Self Management Phase CDT . In addition to MLD, ther ex, skin care and compression bandaging below the knees, Pt will be fitted with custom knee length compression stockings paired with off the shelf , Capri length compression leggings. If insurance benefits allow, she may undergo a trial in the clinic with the advanced Flexitouch device in an effort to reduce discomfort and reduce infection risk. Without skilled OT Li-lymphedema will worsen over time and further functional decline is expected.  Ms Clute is unable to reach feet and distal legs to apply multilayer , gradient compression wraps during the Intensive Phase of CDT.  She understands this limitation and agrees to  arrange for a caregiver to assist her daily with compression wrapping between OT visits. With a caregiver to assist her with Intensive Phase compression her prognosis is fair. Without CG assistance her prognosis is poor.)  OBJECTIVE IMPAIRMENTS: decreased activity tolerance, decreased balance, decreased endurance, decreased knowledge of condition, decreased knowledge of use of DME, decreased mobility, difficulty walking, decreased ROM, decreased strength, increased edema, impaired UE functional use, postural dysfunction, obesity, pain, and chronic leg swelling.   ACTIVITY LIMITATIONS: ACTIVITY LIMITATIONS: Mobility and functional ambulation  limitations:  abnormal gait pattern, difficulty walking, carrying, lifting, bending, sitting, standing, squatting, stairs, transfers, and bed mobility Basic and instrumental ADLs (reaching feet and distal legs to groom nails, inspect skin, apply lotion, bathe lower body, difficulty with  LB dressing, including fitting LB clothing, shoes and socks, impaired sleeping, meal prep, standing to cook, driving, shopping, yard work, house work Paediatric nurse activities: work related activities requiring extended standing, walking, sitting; caring for others Social participation in the community, socializing with others, LE affects body image  Leisure pursuits requiring extended standing, walking, sitting  PERSONAL FACTORS: Fitness, Time since onset of injury/illness/exacerbation, and 3+ comorbidities: Obesity, arthritis, OSA are also affecting patient's functional outcome.   REHAB POTENTIAL: Fair with daily caregiver assistance with gradient compression wrapping. Without assistance prognosis is poor as Pt is unable to apply wraps independently  EVALUATION COMPLEXITY: Moderate   GOALS: Goals reviewed with patient? Yes   SHORT TERM GOALS: Target date: 4th OT Rx visit  Pt will demonstrate understanding of lymphedema precautions and prevention strategies with modified independence using a printed reference to identify at least 5 precautions and discussing how s/he may implement them into daily life to reduce risk of progression with modified assistance Baseline: max a Goal status:GOAL MET   2.  With Max caregiver assistance Pt will be able to apply multilayer, knee length, compression wraps using gradient techniques to decrease limb volume, to limit infection risk, and to limit lymphedema progression.  Given this patient's Intake score of tbd % on the Lymphedema Life Impact Scale (LLIS), patient will experience a reduction of at least 5% in her perceived level of functional impairment resulting from lymphedema to  improve functional performance and quality of life (QOL). Baseline: Dependent Goal status: 03/31/24 GOAL MET, but caregiver has limited availability to assist     LONG TERM GOALS: Target date: 05/13/24 Given this patient's Intake score of 86.76 % on the Lymphedema Life Impact Scale (LLIS), patient will experience a reduction of at least 10% in her perceived level of functional impairment resulting from lymphedema to improve functional performance and quality of life (QOL).Baseline: tbd Baseline: max a Goal status: PROGRESSING     2.  With modified independence (extra time and assistive devices) Pt will be able to don and doff appropriate compression garments and/or devices to control BLE lymphedema and to limit progression.  Baseline: Dependent Goal status: PROGRESSING   3. Pt will achieve at least a 10% volume reductions bilaterally below the knees to return limb to more typical size and shape, to limit infection risk and LE progression, to decrease pain, to improve function, and to improve body image and QOL. Baseline: Dependent Goal status:PROGRESSING. Volumetrics reveal R LEG volume DECREASED by 24.3% since commencing OT for CDT on 02/17/24. This value meets and exceeds the initial 10% volume reduction goal for the RLE. Goal elevated to 25% bilaterally.    4. Pt will achieve and sustain at least 85% compliance with all LE self-care home program components throughout CDT, including modified simple self-MLD, daily skin care and inspection, lymphatic pumping the ex and appropriate compression to limit lymphedema progression and to limit further functional decline. Baseline: Dependent. (Note: W/out daily assistance with donning/ doffing compression  , prognosis is poor.) Goal status:PROGRESSING.Unfortunately there is limited caregiver follow thru , so compliance is limited for compression and attendance.    PLAN:  OT FREQUENCY: 2x/week and PRN Reduce OT too 1 x weekly per Pt request due to  transportation  OT DURATION: other: 18 weeks and PRN  PLANNED INTERVENTIONS:  Complete Decongestive Therapy (CDT): Manual Lymphatic Drainage (MLD) , Skin care, ther ex, gradient compression 97110-Therapeutic exercises, 97530- Therapeutic activity, 97535- Self Care, [id number]- Manual therapy, Patient/Family education, Manual lymph drainage, Compression bandaging, DME  instructions, and trial with advanced, sequential, pneumatic, compression device (T[employer] Medical Flexitouch) Pt will arrange for a caregiver to assist her daily with compression wrapping between OT visits.   PLAN FOR NEXT SESSION:  Pt/ caregiver edu Knee length, Multilayer compression wraps to R leg only MLD Fit RLE CircAid ASAP.  Zebedee Dec, MS, OTR/L, CLT-LANA  '
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
## 2024-06-25 ENCOUNTER — Other Ambulatory Visit: Payer: Self-pay | Admitting: Licensed Clinical Social Worker

## 2024-06-25 ENCOUNTER — Other Ambulatory Visit: Payer: Medicare PPO

## 2024-06-25 NOTE — Patient Instructions (Signed)
 Visit Information  Thank you for taking time to visit with me today. Please don't hesitate to contact me if I can be of assistance to you before our next scheduled appointment.  Our next appointment is by telephone on 07/12/2024 at 11:00 am Please call the care guide team at [phone] if you need to cancel or reschedule your appointment.   Following is a copy of your care plan:   Goals Addressed             This Visit's Progress    BSW VBCI Social Work Care Plan       Problems:   Food Insecurity  and emergency alert and referral for bars in the bathroom   CSW Clinical Goal(s):   Over the next 2 weeks the Patient will will follow up with [facility] and Aging gracefully as directed by Social Work.  Interventions:  SW will mail resources for food pantry, Aging Gracefully  and the emergency alert system  Patient Goals/Self-Care Activities:  Follow up with food pantry  for community food options.  Plan:   Telephone follow up appointment with care management team member scheduled for:  07/12/2024 at 11:00 am        Please call the Suicide and Crisis Lifeline: 988 go to [facility] Urgent [facility] [address], [place] [phone]) call 911 if you are experiencing a Mental Health or Behavioral Health Crisis or need someone to talk to.  Patient verbalized understanding of Care plan and visit instructions communicated this visit  Tobias CHARM Maranda HEDWIG, PhD [facility], [facility] Social Worker Direct Dial: [phone]  Fax: [phone]

## 2024-06-25 NOTE — Patient Outreach (Signed)
 Complex Care Management   Visit Note  06/25/2024  Name:  Natasha [name] MRN: [id number] DOB: 1950/10/18  Situation: Referral received for Complex Care Management related to SDOH Barriers:  Food insecurity Emergncy alert system, PCS services and Medicaid, and SCAT information  I obtained verbal consent from Patient.  Visit completed with Patient  on the phone  Background:   Past Medical History:  Diagnosis Date   Abdominal spasms 12/14/2020   Acute pain of right shoulder 01/31/2023   Acute pulmonary embolism (HCC) 08/21/2019   Bacterial conjunctivitis of both eyes 12/14/2020   Bilateral leg edema 02/10/2020   Bradycardia    Breast cancer screening by mammogram 11/24/2020   Candida infection 10/11/2022   CARCINOMA, THYROID  GLAND, PAPILLARY 05/04/2008   Stage 2, 8/09: thyroidectomy for 2.7cm papillary adenocarcinoma (t2 n0 mo) 9/09: I-131 rx, 108 mci 05/10: tg is neg (ab neg) , total body scan is neg   Chronic right-sided low back pain with right-sided sciatica 11/24/2020   Colon cancer screening 11/24/2020   Colon polyps    Complication of anesthesia    trouble with airway   Cough 12/25/2007   Qualifier: Diagnosis of  By: Germaine LPN, Megan     RNCPI-80 08/19/2019   Decreased ROM of neck 01/31/2023   Diverticulosis    DVT (deep venous thrombosis) (HCC)    Edema 12/22/2008   Encounter to establish care 11/24/2020   Groin pain, chronic, right 11/24/2020   Health care maintenance 11/26/2022   Hip pain 12/22/2020   History of 2019 novel coronavirus disease (COVID-19) 11/09/2019   HYPERTENSION 12/25/2007   HYPOTHYROIDISM, POSTSURGICAL 05/04/2008   LEG PAIN, LEFT 12/09/2008   Low TSH level 12/10/2022   Lumbago with sciatica, right side 01/08/2021   Memory loss due to medical condition 11/09/2019   Muscle weakness    Nausea and vomiting 05/23/2008   Qualifier: Diagnosis of  By: Kassie MD, Alyce LABOR   Formatting of this note might be different from the original. Qualifier:  Diagnosis of  By: Kassie MD, Sean A   Neck pain 01/31/2023   Non-recurrent acute suppurative otitis media of left ear without spontaneous rupture of tympanic membrane 02/17/2023   Numbness and tingling    Obesity, morbid, BMI 50 or higher (HCC) 08/24/2019   OSA (obstructive sleep apnea) 06/15/2013   CPAP   Other fatigue 11/26/2022   Paresthesia 01/04/2020   Peripheral edema 12/22/2008   Qualifier: Diagnosis of  By: Delford, MD, CODY Maude Dunnings    Pneumonia due to COVID-19 virus    Pulmonary embolism (HCC)    Pulmonary embolism (HCC) 08/23/2019   Right lower quadrant pain 11/24/2020   Sciatica of left side 12/04/2011   Skin sensation disturbance 12/05/2008   Qualifier: Diagnosis of  By: Inocencio MD, Berwyn LABOR Deal of this note might be different from the original. Qualifier: Diagnosis of  By: Inocencio MD, Berwyn LABOR   Snoring 12/10/2022   Spasm of muscle of lower back 12/14/2020   Upper back pain 01/31/2023   Vaginal candidiasis 03/16/2021   Weakness 01/04/2020    Assessment: Patient stated that she has some transportation issues and in interested in SCAT but nor sure that SCAT will come down her long driveway about 100 ft. SW will contact SCAT to ask. Patient also would like some light housekeeping [name] , [name], SW will contact the walk in clinic to call patient she has questions about the income level. [place] does not receive  food stamps and would like the food pantry list mailed, SW will mail and will also mail information on the emergency alert system.    SDOH Interventions    Flowsheet Row Patient Outreach Telephone from 06/25/2024 in [place] POPULATION HEALTH DEPARTMENT Patient Outreach Telephone from 05/26/2024 in [place] POPULATION HEALTH DEPARTMENT Patient Outreach Telephone from 05/07/2024 in  POPULATION HEALTH DEPARTMENT Clinical Support from 03/02/2024 in [facility]  SDOH Interventions      Food  Insecurity Interventions [employer] Resources Referral, [employer] Resources Provided  [SW will mail the food bank list] [employer] Provided, [employer] Referral  [Educated patient and walker her through how to access community resources/finghelp via her mychart portal] AMB Referral Intervention Not Indicated  Housing Interventions Intervention Not Indicated -- Intervention Not Indicated Intervention Not Indicated  Transportation Interventions Community Resources Provided  [SW will contact SCAT to see if the drive way is too long and will mail Part A to patient] [employer] Provided, [employer] Referral  [Educated patient and walked through how to [employer],  Regulatory affairs officer via mychart] AMB Referral AMB Referral  Utilities Interventions Intervention Not Indicated -- Intervention Not Indicated Intervention Not Indicated  Alcohol Usage Interventions -- -- -- Intervention Not Indicated (Score <7)  Financial Strain Interventions -- -- -- Intervention Not Indicated  Physical Activity Interventions -- -- -- Patient Declined  Stress Interventions -- -- -- Patient Declined  Social Connections Interventions -- -- -- Intervention Not Indicated  Health Literacy Interventions -- -- -- Intervention Not Indicated      Recommendation:   none  Follow Up Plan:   Telephone follow up appointment date/time:  07/12/2024 at 11:00 am  Tobias CHARM Maranda HEDWIG, PhD [facility], [facility] Social Worker Direct Dial: [phone]  Fax: [phone]

## 2024-06-28 ENCOUNTER — Encounter: Admitting: Occupational Therapy

## 2024-06-28 ENCOUNTER — Ambulatory Visit: Admitting: Physical Therapy

## 2024-06-30 ENCOUNTER — Ambulatory Visit: Admitting: Occupational Therapy

## 2024-06-30 ENCOUNTER — Ambulatory Visit: Admitting: Physical Therapy

## 2024-06-30 DIAGNOSIS — M25561 Pain in right knee: Secondary | ICD-10-CM | POA: Diagnosis not present

## 2024-06-30 DIAGNOSIS — G8929 Other chronic pain: Secondary | ICD-10-CM | POA: Diagnosis not present

## 2024-06-30 DIAGNOSIS — R262 Difficulty in walking, not elsewhere classified: Secondary | ICD-10-CM

## 2024-06-30 DIAGNOSIS — M6281 Muscle weakness (generalized): Secondary | ICD-10-CM | POA: Diagnosis not present

## 2024-06-30 DIAGNOSIS — I890 Lymphedema, not elsewhere classified: Secondary | ICD-10-CM

## 2024-06-30 DIAGNOSIS — R2689 Other abnormalities of gait and mobility: Secondary | ICD-10-CM

## 2024-06-30 DIAGNOSIS — R260 Ataxic gait: Secondary | ICD-10-CM | POA: Diagnosis not present

## 2024-06-30 DIAGNOSIS — M25562 Pain in left knee: Secondary | ICD-10-CM | POA: Diagnosis not present
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
## 2024-07-01 ENCOUNTER — Other Ambulatory Visit

## 2024-07-01 DIAGNOSIS — Z79899 Other long term (current) drug therapy: Secondary | ICD-10-CM

## 2024-07-01 NOTE — Assessment & Plan Note (Signed)
 Chronic. Not currently managed with CPAP. History of snoring, witnessed apnea, sleep disruption and daytime sleepiness. She has history of refractory hypertension.  She has BMI > 35.  Sleep study recently was positive.   We discussed how sleep apnea can affect various health problems including risks for hypertension, cardiovascular disease, and diabetes.  We also discussed how sleep disruption can increase risks for accident, such as while driving.  Weight loss as a means of improving sleep apnea was also reviewed.  We will plan to send order for CPAP and start Zepbound for sleep apnea management to help with this chronic problem.

## 2024-07-01 NOTE — Therapy (Signed)
 OUTPATIENT OCCUPATIONAL THERAPY TREATMENT NOTE AND PROGRESS REPORT  BILATERAL LOWER EXTREMITY/ BLQ LYMPHEDEMA  Patient Name: Natasha [name] MRN: [id number] DOB:1950-10-09, 73 y.o., female Today's Date: 07/01/2024  REPORTING PERIOD: 04/14/24 - 06/30/24  END OF SESSION:  OT End of Session - 07/01/24 1253     Visit Number 20    Number of Visits 36    Date for Recertification  08/24/24    OT Start Time 0305    OT Stop Time 0359    OT Time Calculation (min) 54 min    Activity Tolerance Patient tolerated treatment well;No increased pain    Behavior During Therapy [facility] for tasks assessed/performed             Past Medical History:  Diagnosis Date   Abdominal spasms 12/14/2020   Acute pain of right shoulder 01/31/2023   Acute pulmonary embolism (HCC) 08/21/2019   Bacterial conjunctivitis of both eyes 12/14/2020   Bilateral leg edema 02/10/2020   Bradycardia    Breast cancer screening by mammogram 11/24/2020   Candida infection 10/11/2022   CARCINOMA, THYROID  GLAND, PAPILLARY 05/04/2008   Stage 2, 8/09: thyroidectomy for 2.7cm papillary adenocarcinoma (t2 n0 mo) 9/09: I-131 rx, 108 mci 05/10: tg is neg (ab neg) , total body scan is neg   Chronic right-sided low back pain with right-sided sciatica 11/24/2020   Colon cancer screening 11/24/2020   Colon polyps    Complication of anesthesia    trouble with airway   Cough 12/25/2007   Qualifier: Diagnosis of  By: Germaine LPN, Megan     RNCPI-80 08/19/2019   Decreased ROM of neck 01/31/2023   Diverticulosis    DVT (deep venous thrombosis) (HCC)    Edema 12/22/2008   Encounter to establish care 11/24/2020   Groin pain, chronic, right 11/24/2020   Health care maintenance 11/26/2022   Hip pain 12/22/2020   History of 2019 novel coronavirus disease (COVID-19) 11/09/2019   HYPERTENSION 12/25/2007   HYPOTHYROIDISM, POSTSURGICAL 05/04/2008   LEG PAIN, LEFT 12/09/2008   Low TSH level 12/10/2022   Lumbago with sciatica, right side  01/08/2021   Memory loss due to medical condition 11/09/2019   Muscle weakness    Nausea and vomiting 05/23/2008   Qualifier: Diagnosis of  By: Kassie MD, Alyce LABOR   Formatting of this note might be different from the original. Qualifier: Diagnosis of  By: Kassie MD, Sean A   Neck pain 01/31/2023   Non-recurrent acute suppurative otitis media of left ear without spontaneous rupture of tympanic membrane 02/17/2023   Numbness and tingling    Obesity, morbid, BMI 50 or higher (HCC) 08/24/2019   OSA (obstructive sleep apnea) 06/15/2013   CPAP   Other fatigue 11/26/2022   Paresthesia 01/04/2020   Peripheral edema 12/22/2008   Qualifier: Diagnosis of  By: Delford, MD, CODY Maude Dunnings    Pneumonia due to COVID-19 virus    Pulmonary embolism (HCC)    Pulmonary embolism (HCC) 08/23/2019   Right lower quadrant pain 11/24/2020   Sciatica of left side 12/04/2011   Skin sensation disturbance 12/05/2008   Qualifier: Diagnosis of  By: Inocencio MD, Berwyn LABOR Deal of this note might be different from the original. Qualifier: Diagnosis of  By: Inocencio MD, Valerie A   Snoring 12/10/2022   Spasm of muscle of lower back 12/14/2020   Upper back pain 01/31/2023   Vaginal candidiasis 03/16/2021   Weakness 01/04/2020   Past Surgical History:  Procedure Laterality Date   ABDOMINAL HYSTERECTOMY  due to bleeding, ovaries remain   ANKLE SURGERY Right    CERVICAL FUSION     x 2   COLONOSCOPY WITH PROPOFOL  N/A 08/08/2015   Procedure: COLONOSCOPY WITH PROPOFOL ;  Surgeon: Renaye Sous, MD;  Location: [facility];  Service: Endoscopy;  Laterality: N/A;   KNEE SURGERY Left    TOTAL THYROIDECTOMY     Patient Active Problem List   Diagnosis Date Noted   Other skin changes 06/16/2024   Fall 06/07/2024   Numerous skin moles 06/07/2024   Pain in both lower extremities 06/07/2024   Anemia 03/04/2024   Chronic venous insufficiency 09/10/2023   Atrophic vaginitis 05/14/2023   Pelvic floor  dysfunction 05/14/2023   Pancreatic insufficiency 05/14/2023   Arthritis of knee 03/24/2023   At risk for fall due to comorbid condition 03/10/2023   Mood changes 02/17/2023   Recurrent urinary tract infection 01/31/2023   Walker as ambulation aid 01/31/2023   B12 deficiency 11/26/2022   Prediabetes 11/26/2022   Obesity, Class III, BMI 40-49.9 (morbid obesity) (HCC) 11/26/2022   Dermatofibroma 10/11/2022   Fatty liver 04/20/2021   Incontinence of urine in female 04/20/2021   Vitamin D  deficiency 03/16/2021   Constipation 11/24/2020   Gastroesophageal reflux disease 11/24/2020   OAB (overactive bladder) 11/24/2020   Aortic atherosclerosis 11/24/2020   Right-sided heart failure (HCC) 11/24/2020   History of pulmonary embolism 02/10/2020   Lymphedema 02/10/2020   Mobility impaired 01/04/2020   Muscle weakness (generalized) 11/09/2019   Pulmonary nodule 11/09/2019   OSA (obstructive sleep apnea) 06/15/2013   Allergic rhinitis 01/21/2009   History of papillary adenocarcinoma of thyroid  05/04/2008   Hypothyroidism, postsurgical 05/04/2008   Essential hypertension 12/25/2007   SOB (shortness of breath) on exertion 12/25/2007    PCP: Camie Doing, NP  REFERRING PROVIDER: Camie Doing, NP  REFERRING DIAG: I89.0  THERAPY DIAG:  Lymphedema, not elsewhere classified  Rationale for Evaluation and Treatment: Rehabilitation  ONSET DATE: >15 years  SUBJECTIVE:                                                                                                                                                                                          SUBJECTIVE STATEMENT: Natasha [name] presents to OT in manual transport wc for LE care. Pt reports knee pain is 5/10. Pt states she is doing well  with the new compression legging on the right leg. She tells me she is able to don and doff it herself when positioned on her bed. Pt does not bring compression wraps for the LLE because she forgot them. She  tells me that her L leg went way down during visit  interval with compression wraps, but it is more swollen today. Pt states she would like to measure for CircAid for R Leg next visit if it is reduced dramatically again. Pt encouraged to stay wrapped before her next session to optimize volume reduction before measuring.   PERTINENT HISTORY:  Hypothyroidism, postsurgical Essential hypertension Dyspnea OSA (obstructive sleep apnea) Obesity, morbid, BMI 50 or higher (HCC) Muscle weakness (generalized) Gait abnormality History of pulmonary embolism Bilateral lower extremity edema Chronic right-sided low back pain with right-sided sciatica Right-sided heart failure (HCC) Hip pain Incontinence of urine in female Dermatofibroma Morbid obesity (HCC) BMI 50.0-59.9, adult (HCC) SOB (shortness of breath) on exertion Other fatigue Prediabetes Decreased ROM of neck Upper back pain Acute pain of right shoulder Recurrent urinary tract infection Urine frequency Walker as ambulation aid Ambulatory dysfunction Mood changes At risk for fall due to comorbid condition Arthritis of knee Hypothyroidism Chronic venous insufficiency Mobility impaired Weakness of both lower extremities Gait instability Lymphedema BMI 45.0-49.9, adult (HCC)  PAIN:  Are you having pain? Yes, B knees: NPRS scale: 5/10 Pain location: seated and at rest Pain description: creaky crackly, tight, heavy, sore, tender. With and without weight bearing Aggravating factors: standing, walking, weight bearing Relieving factors: walking  PRECAUTIONS: Other: LYMPHEDEMA PRECAUTIONS: prediabetic, hypothyroid, Fall Risk  RED FLAGS: Urinary incontinence; numbness and tingling in fingers, hands and toes   WEIGHT BEARING RESTRICTIONS: No  FALLS:  Has patient fallen in last 6 months? No  LIVING ENVIRONMENT: Lives with: lives with their daughter and  granddaughter Lives in: House/apartment Stairs: Yes; Internal: 14 steps;  on right going up and External: garage 4 steps steps; can reach both Has following equipment at home: Walker - 4 wheeled, shower chair, and elevated toilet seat  OCCUPATION: retired careers information officer professor  LEISURE: concerts, writing and producing plays  HAND DOMINANCE: right   PRIOR LEVEL OF FUNCTION: Independent  PATIENT GOALS: walk unassisted; lose weight   OBJECTIVE: Note: Objective measures were completed at Evaluation unless otherwise noted.  COGNITION:  Overall cognitive status: Within functional limits for tasks assessed   OBSERVATIONS / OTHER ASSESSMENTS:   POSTURE: head forward, slight shoulder protraction  LE ROM: Limited at hips knees and ankles 2/2 body habitus and skin approximation  LE MMT: generalized weakness  LYMPHEDEMA ASSESSMENTS:   BLE COMPARATIVE LIMB VOLUMETRICS: Initial 02/17/24  LANDMARK RIGHT   R LEG (A-D) 6530.2 ml  R THIGH (E-G) ml  R FULL LIMB (A-G) ml  Limb Volume differential (LVD)  %  Volume change since initial %  Volume change overall V  (Blank rows = not tested)  LANDMARK LEFT  L LEG (A-D) 6601.0  L  THIGH (E-G) ml  L  FULL LIMB (A-G) ml  Limb Volume differential (LVD)  1.08% L>R  Volume change since initial %  Volume change overall %  (Blank rows = not tested)    RLE COMPARATIVE LIMB VOLUMETRICS: 9th visit 03/31/24   LANDMARK RIGHT   R LEG (A-D) 4941.4 ml  R THIGH (E-G) ml  R FULL LIMB (A-G) ml  Limb Volume differential (LVD)  %  Volume change since initial R LEG volume DECREASED by 24.3% since commencing OT for CDT on 02/17/24   Volume change overall V  (Blank rows = not tested)  BLE COMPARATIVE LIMB VOLUMETRICS: 20 th visit progress note- TBA next visit  LANDMARK RIGHT   R LEG (A-D)  ml  R THIGH (E-G) ml  R FULL LIMB (A-G) ml  Limb Volume differential (LVD)  %  Volume change since initial %  Volume change overall V  (Blank rows = not tested)  LANDMARK LEFT  L LEG (A-D)   L  THIGH (E-G) ml  L  FULL LIMB (A-G) ml   Limb Volume differential (LVD)  %  Volume change since initial %  Volume change overall %  (Blank rows = not tested)      SKIN/TISSUE INTEGRITY: : Moderate, Stage  II, Bilateral Lower Extremity Lymphedema 2/2 CVI,  Obesity, and Mm weakness  Skin  Description Hyper-Keratosis Peau d' Orange Shiny Tight Fibrotic/ Indurated Fatty Hard Spongy/ boggy   x   x R>L x x x   Skin dry Flaky WNL Macerated   mildly      Color Redness Varicosities Blanching Hemosiderin Stain Mottled        x   Odor Malodorous Yeast Fungal infection  WNL      x   Temperature Warm Cool wnl    x     Pitting Edema   1+ 2+ 3+ 4+ Non-pitting   x         Girth Symmetrical Asymmetrical                   Distribution    R>L toes to groin, bilaterally    Stemmer Sign Positive Negative   +    Lymphorrhea History Of:  Present Absent     x    Wounds History Of Present Absent Venous Arterial Pressure Sheer   denies  x        Signs of Infection Redness Warmth Erythema Acute Swelling Drainage Borders                    Sensation Light Touch Deep pressure Hypersensitivity   In tact Impaired In tact Impaired Absent Impaired   x  x  x     Nails WNL   Fungus nail dystrophy   TBA     Hair Growth Symmetrical Asymmetrical   TB Ax    Skin Creases Base of toes  Ankles   Base of Fingers knees       Abdominal pannus Thigh Lobules  Face/neck   x x  x       GAIT: Pt transported to clinic in transport wheelchair Distance walked: Pt able to transfer out of wc and walk 4-5 steps using 2 wheeled walker to Rx bed with extra time (modified independent).  Assistive device utilized: Pt able to lift legs onto Rx bed, one at a time, using hands to actively assist Level of assistance: Modified independence Comments: Transfers  and bed mobility take an inordinate amount of time  LYMPHEDEMA LIFE IMPACT SCALE (LLIS): Initial: 86.76% (The extent to which LE-related problems impacted your life over the past  week.)  TREATMENT THIS DATE: RLE MLD Pt/ CG edu R LEG multilayer compression rapping.  PATIENT EDUCATION:  Continued Pt/ CG edu for lymphedema self care home program throughout session. Topics include outcome of comparative limb volumetrics- starting limb volume differentials (LVDs), technology and gradient techniques used for short stretch, multilayer compression wrapping, simple self-MLD, therapeutic lymphatic pumping exercises, skin/nail care, LE precautions, compression garment recommendations and specifications, wear and care schedule and compression garment donning / doffing w assistive devices. Discussed progress towards all OT goals since commencing CDT. Discussed detrimental impact of obesity on lower and upper extremity lymphedema over time. Reviewed OT goals for lymphedema care with Pt and discussed progress to date.  All questions answered to the Pt's satisfaction. Good return. Person educated: Patient and family Education method: Explanation, Demonstration, and Handouts Education comprehension: verbalized understanding, returned demonstration, verbal cues required, and needs further education  LYMPHEDEMA SELF-CARE HOME PROGRAM: BLE lymphatic pumping there ex- 1 set of 10 each element, in order. Hold 5. 2 x daily 2. Daily, short stretch, thigh length, multilayer compression bandages during Intensive Phase CDT 3. During self-Management Phase fit with appropriate compression garments and/ or devices (Custom CircAid Juxtafit Essential A-D) 3. Daily skin care with low ph lotion matching skin ph 4. Daily simple self MLD   ASSESSMENT:  CLINICAL IMPRESSION: Continued CDT to the L LEG today, including MLD and multilayer compression wraps. Loaned compression wraps since Pt forgot to bring hers to clinic today. Some skin wrinkling is visible after some wrapping between  visits. Pt has increased tenderness to compression and light touch at distal leg and dorsal L foot. Cont as per POC.   (02/12/24 Initial OT Eval : Natasha [name] is a 73 yo female presenting with moderate, stage  II, bilateral lower extremity lymphedema 2/2 CVI,  obesity, and mm weakness. She will benefit from skilled Occupational Therapy to reduce limb swelling and associated pain, to limit progression and infection risk. BLE lymphedema limits functional performance in all occupational domains, including functional mobility and ambulation,  basic and instrumental ADLs, productive activities, leisure pursuits, social participation and quality of life. This patient will benefit from modified Intensive and Self Management Phase CDT . In addition to MLD, ther ex, skin care and compression bandaging below the knees, Pt will be fitted with custom knee length compression stockings paired with off the shelf , Capri length compression leggings. If insurance benefits allow, she may undergo a trial in the clinic with the advanced Flexitouch device in an effort to reduce discomfort and reduce infection risk. Without skilled OT Li-lymphedema will worsen over time and further functional decline is expected.  Natasha [name] is unable to reach feet and distal legs to apply multilayer , gradient compression wraps during the Intensive Phase of CDT.  She understands this limitation and agrees to  arrange for a caregiver to assist her daily with compression wrapping between OT visits. With a caregiver to assist her with Intensive Phase compression her prognosis is fair. Without CG assistance her prognosis is poor.)  OBJECTIVE IMPAIRMENTS: decreased activity tolerance, decreased balance, decreased endurance, decreased knowledge of condition, decreased knowledge of use of DME, decreased mobility, difficulty walking, decreased ROM, decreased strength, increased edema, impaired UE functional use, postural dysfunction, obesity, pain, and  chronic leg swelling.   ACTIVITY LIMITATIONS: ACTIVITY LIMITATIONS: Mobility and functional ambulation limitations:  abnormal gait pattern, difficulty walking, carrying, lifting, bending, sitting, standing, squatting, stairs, transfers, and bed mobility Basic and instrumental ADLs (reaching feet and distal legs to groom nails,  inspect skin, apply lotion, bathe lower body, difficulty with LB dressing, including fitting LB clothing, shoes and socks, impaired sleeping, meal prep, standing to cook, driving, shopping, yard work, house work Paediatric Nurse activities: work related activities requiring extended standing, walking, sitting; caring for others Social participation in the community, socializing with others, LE affects body image  Leisure pursuits requiring extended standing, walking, sitting  PERSONAL FACTORS: Fitness, Time since onset of injury/illness/exacerbation, and 3+ comorbidities: Obesity, arthritis, OSA are also affecting patient's functional outcome.   REHAB POTENTIAL: Fair with daily caregiver assistance with gradient compression wrapping. Without assistance prognosis is poor as Pt is unable to apply wraps independently  EVALUATION COMPLEXITY: Moderate   GOALS: Goals reviewed with patient? Yes   SHORT TERM GOALS: Target date: 4th OT Rx visit  Pt will demonstrate understanding of lymphedema precautions and prevention strategies with modified independence using a printed reference to identify at least 5 precautions and discussing how s/he may implement them into daily life to reduce risk of progression with modified assistance Baseline: max a Goal status:GOAL MET   2.  With Max caregiver assistance Pt will be able to apply multilayer, knee length, compression wraps using gradient techniques to decrease limb volume, to limit infection risk, and to limit lymphedema progression.  Given this patient's Intake score of tbd % on the Lymphedema Life Impact Scale (LLIS), patient will  experience a reduction of at least 5% in her perceived level of functional impairment resulting from lymphedema to improve functional performance and quality of life (QOL). Baseline: Dependent Goal status: 03/31/24 GOAL MET, but caregiver has limited availability to assist     LONG TERM GOALS: Target date: 05/13/24 Given this patient's Intake score of 86.76 % on the Lymphedema Life Impact Scale (LLIS), patient will experience a reduction of at least 10% in her perceived level of functional impairment resulting from lymphedema to improve functional performance and quality of life (QOL).Baseline: tbd Baseline: max a Goal status: PROGRESSING     2.  With modified independence (extra time and assistive devices) Pt will be able to don and doff appropriate compression garments and/or devices to control BLE lymphedema and to limit progression.  Baseline: Dependent Goal status: PROGRESSING   3. Pt will achieve at least a 10% volume reductions bilaterally below the knees to return limb to more typical size and shape, to limit infection risk and LE progression, to decrease pain, to improve function, and to improve body image and QOL. Baseline: Dependent Goal status:PROGRESSING. Volumetrics reveal R LEG volume DECREASED by 24.3% since commencing OT for CDT on 02/17/24. This value meets and exceeds the initial 10% volume reduction goal for the RLE. Goal elevated to 25% bilaterally.    4. Pt will achieve and sustain at least 85% compliance with all LE self-care home program components throughout CDT, including modified simple self-MLD, daily skin care and inspection, lymphatic pumping the ex and appropriate compression to limit lymphedema progression and to limit further functional decline. Baseline: Dependent. (Note: W/out daily assistance with donning/ doffing compression  , prognosis is poor.) Goal status:PROGRESSING.Unfortunately there is limited caregiver follow thru , so compliance is limited for  compression and attendance.    PLAN:  OT FREQUENCY: 2x/week and PRN Reduce OT too 1 x weekly per Pt request due to transportation  OT DURATION: other: 18 weeks and PRN  PLANNED INTERVENTIONS:  Complete Decongestive Therapy (CDT): Manual Lymphatic Drainage (MLD) , Skin care, ther ex, gradient compression 97110-Therapeutic exercises, 97530- Therapeutic activity, 97535- Self Care, [id number]- Manual  therapy, Patient/Family education, Manual lymph drainage, Compression bandaging, DME instructions, and trial with advanced, sequential, pneumatic, compression device (Tactile Medical Flexitouch) Pt will arrange for a caregiver to assist her daily with compression wrapping between OT visits.   PLAN FOR NEXT SESSION:  Pt/ caregiver edu Knee length, Multilayer compression wraps to R leg only MLD Fit RLE CircAid ASAP.  Zebedee Dec, Natasha, OTR/L, CLT-LANA  '

## 2024-07-01 NOTE — Progress Notes (Signed)
 07/01/2024 Name: Natasha [name] MRN: [id number] DOB: [birth year]  Chief Complaint  Patient presents with   Medication Management    Natasha [name] is a 73 y.o. year old female who presented for a telephone visit.   They were referred to the pharmacist by the [facility] Complex Case Management program for assistance in managing complex medication management.    Subjective:  Care Team: Primary Care Provider: Oris Camie BRAVO, NP ; Next Scheduled Visit: 06/07/24  Medication Access/Adherence  Current Pharmacy:  [facility] [phone] [place], [place] - [address] [place] [address] Phone: [phone] Fax: [phone]  [id number] [place] - [address] [place] [phone] Phone: [phone] Fax: [phone]  [facility] [address] [place] [address] Phone: [phone] Fax: [phone]   Patient reports affordability concerns with their medications: No  Patient reports access/transportation concerns to their pharmacy: Yes  Patient reports adherence concerns with their medications:  Yes  admits she does not take all meds as prescribed and sometimes skips days   Hypertension:  Current medications: Valsartan  160mg  daily Medications previously tried: Amlodipine   Patient does not have a validated, automated, upper arm home BP cuff Current blood pressure readings readings: Not able to check, has a machine available for pick up at office but forgot to come by  Patient denies hypotensive s/sx including dizziness, lightheadedness.  Patient denies hypertensive symptoms including headache, chest pain, shortness of breath   Medication Management:  Current adherence strategy: None  Patient reports Poor adherence to medications  Patient reports the following barriers to [facility]: [facility], not wanting to take so many pills  Patient would like to use a  pharmacy that offers free delivery. She also is not sure if she still needs potassium or rx vit D (has been taking OTC 2000 units daily after running out of rx - reports she did not realize [facility])  -Never started Zepbound, wanted to discuss potential side effects more  Objective:  Lab Results  Component Value Date   HGBA1C 5.7 (H) 06/07/2024    Lab Results  Component Value Date   CREATININE 0.71 06/07/2024   BUN 11 06/07/2024   NA 140 06/07/2024   K 4.2 06/07/2024   CL 103 06/07/2024   CO2 21 06/07/2024    Lab Results  Component Value Date   CHOL 161 11/26/2022   HDL 83 11/26/2022   LDLCALC 67 11/26/2022   TRIG 52 11/26/2022   CHOLHDL 2.1 10/11/2022    Medications Reviewed Today     Reviewed by Lionell Jon DEL, RPH (Pharmacist) on 07/01/24 at 1400  Med List Status: <None>   Medication Order Taking? Sig Documenting Provider Last Dose Status Informant  acetaminophen  (TYLENOL ) 650 MG CR tablet [id number]  Take 650 mg by mouth every 8 (eight) hours as needed for pain. [provider]  Active   AMBULATORY JESSE SCHLOSSMAN MEDICATION [id number]  Motorized Scooter based on insurance coverage. Early, Sara E, NP  Active   diclofenac  Sodium (VOLTAREN  ARTHRITIS PAIN) 1 % GEL [id number]  Apply 2 g topically 4 (four) times daily. Early, Sara E, NP  Active   Elastic Bandages & Supports (MEDICAL COMPRESSION STOCKINGS) MISC [id number]  Thigh high compression stockings for venous insufficiency and lymphedema. Please measure ankle, calf, and thigh circumference and leg length for fit. Brand per insurance. Requesting one pair. Early,  Camie BRAVO, NP  Active   Elastic Bandages & Supports (MEDICAL COMPRESSION STOCKINGS) MISC [id number]  30-40 mmHg knee length compression stockings for venous insufficiency and lymphedema. Please measure ankle and calf circumference and leg length for fit. Brand per insurance coverage. Requesting 3 pair. Early, Sara E, NP  Active   furosemide   (LASIX ) 20 MG tablet [id number]  Take 1 tablet (20 mg total) by mouth 2 (two) times daily. Tysinger, Alm RAMAN, PA-C  Active   levothyroxine  (SYNTHROID ) 125 MCG tablet [id number]  Take 1 tablet (125 mcg total) by mouth daily. Early, Sara E, NP  Active   magnesium (MAGTAB) 84 MG ( ) TBCR SR tablet [id number]  Take 72 mg by mouth. [provider]  Active   Multiple Vitamin (MULTIVITAMIN WITH MINERALS) TABS tablet [id number]  Take 1 tablet by mouth daily. [provider]  Active Self  OVER THE COUNTER MEDICATION [id number]  Take by mouth daily. Omega XL  Patient is taking 2 gel caps daily [provider]  Active            Med Note DELILA, ADAM D   Mon Jun 07, 2024 11:55 AM) Urolithin a 2000 mg  OVER THE COUNTER MEDICATION [id number]  Take by mouth daily. Osteo Bi Flex patient is taking 2 tablets daily [provider]  Active   OVER THE COUNTER MEDICATION [id number]   [provider]  Active   OVER THE COUNTER MEDICATION [id number]   [provider]  Active   potassium chloride  (KLOR-CON ) 10 MEQ tablet [id number]  Take 1 tablet (10 mEq total) by mouth 2 (two) times daily. Tysinger, Alm RAMAN, PA-C  Active   rivaroxaban  (XARELTO ) 20 MG TABS tablet [id number]  Take 1 tablet (20 mg total) by mouth daily with supper. Early, Sara E, NP  Active   tirzepatide [facility]) 2.5 MG/0.5ML Pen [id number]  Inject 2.5 mg into the skin once a week. After 4 weeks, increase to 5mg . Early, Sara E, NP  Active   valsartan  (DIOVAN ) 160 MG tablet [id number]  Take 1 tablet (160 mg total) by mouth daily. Early, Sara E, NP  Active   Vitamin D , Ergocalciferol , (DRISDOL ) 1.25 MG (50000 UNIT) CAPS capsule [id number]  Take 1 capsule (50,000 Units total) by mouth every 7 (seven) days. Oris Camie BRAVO, NP  Active               Assessment/Plan:   Hypertension: - Currently controlled - Reviewed long term cardiovascular and renal outcomes of uncontrolled blood pressure - Reviewed  appropriate blood pressure monitoring technique and reviewed goal blood pressure. Recommended to check home blood pressure and heart rate daily. -The patients has a diagnosis of hypertension and it is medically necessary for them to have access to a home device to monitor blood pressure.  The patient does not have readily available insurance access to a device and cannot afford to purchase a device at this time.  The patient has been counseled that they do not need to continue to receive services from [facility] to receive a device.  The patient will be given a device free of charge. - Recommend to continue current medication therapy. Reminded to pick up BP cuff that is at office.     Medication Management: - Currently strategy insufficient to maintain appropriate adherence to prescribed medication regimen - Suggested use of weekly pill box to organize medications -Patient in [place] currently, will follow up on discussion of medication delivery at next visit -Discussed zepbound use, patient plans  to start once she returns home next week -Will send message to PCP/CMA to notify patient requesting update on CPAP machine, incontinence supplies, and reports she needs an extension for handicap parking   Follow Up Plan: 4 weeks  Jon VEAR Lindau, PharmD Clinical Pharmacist [phone]

## 2024-07-05 ENCOUNTER — Encounter: Admitting: Occupational Therapy

## 2024-07-05 ENCOUNTER — Ambulatory Visit: Admitting: Physical Therapy

## 2024-07-07 ENCOUNTER — Telehealth: Admitting: Medical

## 2024-07-07 ENCOUNTER — Telehealth: Payer: Self-pay

## 2024-07-07 ENCOUNTER — Ambulatory Visit: Admitting: Physical Therapy

## 2024-07-07 ENCOUNTER — Ambulatory Visit: Admitting: Occupational Therapy

## 2024-07-07 ENCOUNTER — Other Ambulatory Visit: Payer: Self-pay | Admitting: Nurse Practitioner

## 2024-07-07 VITALS — Wt 315.0 lb

## 2024-07-07 DIAGNOSIS — E039 Hypothyroidism, unspecified: Secondary | ICD-10-CM

## 2024-07-07 DIAGNOSIS — J988 Other specified respiratory disorders: Secondary | ICD-10-CM | POA: Diagnosis not present

## 2024-07-07 DIAGNOSIS — H9201 Otalgia, right ear: Secondary | ICD-10-CM | POA: Diagnosis not present

## 2024-07-07 DIAGNOSIS — R509 Fever, unspecified: Secondary | ICD-10-CM

## 2024-07-07 MED ORDER — AMOXICILLIN 875 MG PO TABS: 875.0000 mg | ORAL_TABLET | Freq: Two times a day (BID) | ORAL | 0 refills | Status: AC

## 2024-07-07 MED ORDER — LEVOTHYROXINE SODIUM 125 MCG PO TABS: 125.0000 ug | ORAL_TABLET | Freq: Every day | ORAL | 1 refills | Status: AC

## 2024-07-07 NOTE — Telephone Encounter (Signed)
 Copied from CRM [id number]. Topic: Clinical - Medication Refill >> Jul 07, 2024  9:44 AM Myrick T wrote: Medication:  levothyroxine  (SYNTHROID ) 125 MCG tablet   Has the patient contacted their pharmacy? No  This is the patient's preferred pharmacy:  [phone] [place], [place] - [address] [place] [address] Phone: [phone] Fax: [phone]  Is this the correct pharmacy for this prescription? Yes  Has the prescription been filled recently? Yes  Is the patient out of the medication? Yes  Has the patient been seen for an appointment in the last year OR does the patient have an upcoming appointment? Yes  Can we respond through MyChart? Yes  Agent: Please be advised that Rx refills may take up to 3 business days. We ask that you follow-up with your pharmacy.

## 2024-07-07 NOTE — Telephone Encounter (Signed)
 Pt. Will need an appt. For evaluation virtual is fine.     Copied from CRM [id number]. Topic: Clinical - Medication Question >> Jul 07, 2024  9:47 AM Myrick T wrote: Reason for CRM: patient called stated she is having cold chills, body aches and right ear pain. She wants to see if provider can prescribe her a medication for her symptoms or get a telehealth appt. Please f/u with patient

## 2024-07-07 NOTE — Progress Notes (Signed)
 Subjective:     Patient ID: Natasha [name], female   DOB: 1951-08-26, 74 y.o.   MRN: [id number]  This visit type was conducted due to national recommendations for restrictions regarding the COVID-19 Pandemic (e.g. social distancing) in an effort to limit this patient's exposure and mitigate transmission in our community.  Due to their co-morbid illnesses, this patient is at least at moderate risk for complications without adequate follow up.  This format is felt to be most appropriate for this patient at this time.    Documentation for virtual audio and video telecommunications through [facility] encounter:  The patient was located at home. The provider was located in the office. The patient did consent to this visit and is aware of possible charges through their insurance for this visit.  The other persons participating in this telemedicine service were none. Time spent on call was 20 minutes and in review of previous records 20 minutes total.  This virtual service is not related to other E/M service within previous 7 days.   HPI Chief Complaint  Patient presents with   Acute Visit    Right ear pain on outside and inside of ear. Body aches and chills x 2-3 days   Virtual consult for illness.  She has been dealing with flulike symptoms for about 5 days since last week.  She has had subjective fever, chills but no bodyaches.  She has had some slight to moderate sore throat.  Her main concern is ongoing right ear pain, right facial pressure.  Has some cough.  No abdominal pain.  No nausea vomiting or diarrhea.  No shortness of breath or wheezing.  She is using Coricidin and Tylenol .  She has been around her daughter who teaches kindergarten and there have been several sick contacts there but nobody specifically with flu or COVID or hand-foot-and-mouth disease which has been in the community of late.   Natasha [name] also just got back from a trip to [place].  She has not done any flu or COVID  testing.  No other aggravating or relieving factors. No other complaint.  Past Medical History:  Diagnosis Date   Abdominal spasms 12/14/2020   Acute pain of right shoulder 01/31/2023   Acute pulmonary embolism (HCC) 08/21/2019   Bacterial conjunctivitis of both eyes 12/14/2020   Bilateral leg edema 02/10/2020   Bradycardia    Breast cancer screening by mammogram 11/24/2020   Candida infection 10/11/2022   CARCINOMA, THYROID  GLAND, PAPILLARY 05/04/2008   Stage 2, 8/09: thyroidectomy for 2.7cm papillary adenocarcinoma (t2 n0 mo) 9/09: I-131 rx, 108 [facility] 05/10: tg is neg (ab neg) , total body scan is neg   Chronic right-sided low back pain with right-sided sciatica 11/24/2020   Colon cancer screening 11/24/2020   Colon polyps    Complication of anesthesia    trouble with airway   Cough 12/25/2007   Qualifier: Diagnosis of  By: Germaine LPN, Megan     RNCPI-80 08/19/2019   Decreased ROM of neck 01/31/2023   Diverticulosis    DVT (deep venous thrombosis) (HCC)    Edema 12/22/2008   Encounter to establish care 11/24/2020   Groin pain, chronic, right 11/24/2020   Health care maintenance 11/26/2022   Hip pain 12/22/2020   History of 2019 novel coronavirus disease (COVID-19) 11/09/2019   HYPERTENSION 12/25/2007   HYPOTHYROIDISM, POSTSURGICAL 05/04/2008   LEG PAIN, LEFT 12/09/2008   Low TSH level 12/10/2022   Lumbago with sciatica, right side 01/08/2021   Memory loss due  to medical condition 11/09/2019   Muscle weakness    Nausea and vomiting 05/23/2008   Qualifier: Diagnosis of  By: Kassie MD, Alyce DELENA Deal of this note might be different from the original. Qualifier: Diagnosis of  By: Kassie MD, Sean A   Neck pain 01/31/2023   Non-recurrent acute suppurative otitis media of left ear without spontaneous rupture of tympanic membrane 02/17/2023   Numbness and tingling    Obesity, morbid, BMI 50 or higher (HCC) 08/24/2019   OSA (obstructive sleep apnea) 06/15/2013   CPAP    Other fatigue 11/26/2022   Paresthesia 01/04/2020   Peripheral edema 12/22/2008   Qualifier: Diagnosis of  By: Delford, MD, CODY Maude Dunnings    Pneumonia due to COVID-19 virus    Pulmonary embolism (HCC)    Pulmonary embolism (HCC) 08/23/2019   Right lower quadrant pain 11/24/2020   Sciatica of left side 12/04/2011   Skin sensation disturbance 12/05/2008   Qualifier: Diagnosis of  By: Inocencio MD, Berwyn DELENA Deal of this note might be different from the original. Qualifier: Diagnosis of  By: Inocencio MD, Valerie A   Snoring 12/10/2022   Spasm of muscle of lower back 12/14/2020   Upper back pain 01/31/2023   Vaginal candidiasis 03/16/2021   Weakness 01/04/2020   Current Outpatient Medications on File Prior to Visit  Medication Sig Dispense Refill   diclofenac  Sodium (VOLTAREN  ARTHRITIS PAIN) 1 % GEL Apply 2 g topically 4 (four) times daily. 350 g 1   furosemide  (LASIX ) 20 MG tablet Take 1 tablet (20 mg total) by mouth 2 (two) times daily. 180 tablet 0   magnesium (MAGTAB) 84 MG ( ) TBCR SR tablet Take 72 mg by mouth.     Multiple Vitamin (MULTIVITAMIN WITH MINERALS) TABS tablet Take 1 tablet by mouth daily.     OVER THE COUNTER MEDICATION Take by mouth daily. Omega XL  Patient is taking 2 gel caps daily     OVER THE COUNTER MEDICATION Take by mouth daily. Osteo Bi Flex patient is taking 2 tablets daily     potassium chloride  (KLOR-CON ) 10 MEQ tablet Take 1 tablet (10 mEq total) by mouth 2 (two) times daily. 60 tablet 0   rivaroxaban  (XARELTO ) 20 MG TABS tablet Take 1 tablet (20 mg total) by mouth daily with supper. 90 tablet 3   tirzepatide (ZEPBOUND) 2.5 MG/0.5ML Pen Inject 2.5 mg into the skin once a week. After 4 weeks, increase to 5mg . 2 mL 0   valsartan  (DIOVAN ) 160 MG tablet Take 1 tablet (160 mg total) by mouth daily. 90 tablet 3   Vitamin D , Ergocalciferol , (DRISDOL ) 1.25 MG (50000 UNIT) CAPS capsule Take 1 capsule (50,000 Units total) by mouth every 7 (seven) days.  12 capsule 3   acetaminophen  (TYLENOL ) 650 MG CR tablet Take 650 mg by mouth every 8 (eight) hours as needed for pain.     AMBULATORY NON FORMULARY MEDICATION Motorized Scooter based on insurance coverage. 1 each 0   Elastic Bandages & Supports (MEDICAL COMPRESSION STOCKINGS) MISC Thigh high compression stockings for venous insufficiency and lymphedema. Please measure ankle, calf, and thigh circumference and leg length for fit. Brand per insurance. Requesting one pair. 2 each 2   Elastic Bandages & Supports (MEDICAL COMPRESSION STOCKINGS) MISC 30-40 mmHg knee length compression stockings for venous insufficiency and lymphedema. Please measure ankle and calf circumference and leg length for fit. Brand per insurance coverage. Requesting 3 pair. 6 each 2   levothyroxine  (SYNTHROID ) 125  MCG tablet Take 1 tablet (125 mcg total) by mouth daily. 90 tablet 1   No current facility-administered medications on file prior to visit.    Review of Systems As in subjective    Objective:   Physical Exam Due to coronavirus pandemic stay at home measures, patient visit was virtual and they were not examined in person.   Wt (!) 315 lb (142.9 kg)   BMI 49.34 kg/m   Gen: wd, wn ,nad Seems to be having quite a bit of right ear pain and right sinus facial pressure No labored breathing or wheezing Answers questions appropriately No rash of hands or feet      Assessment:     Encounter Diagnoses  Name Primary?   Right ear pain Yes   Respiratory tract infection    Fever, unspecified fever cause        Plan:     We discussed symptoms and concerns.  We discussed limitation of virtual consult.  She may have residual ear infection that stemmed from a recent viral illness.  It is also difficult for her to travel and get in here for in person visit.  Advised to continue good water intake and rest, consider adding nasal saline flush.  Continue Coricidin and Tylenol  for the next few days.  Begin amoxicillin   antibiotic.  If not much improved within the next 5 days or if worse in the short-term call or recheck in person   Takiyah was seen today for acute visit.  Diagnoses and all orders for this visit:  Right ear pain  Respiratory tract infection  Fever, unspecified fever cause  Other orders -     amoxicillin  (AMOXIL ) 875 MG tablet; Take 1 tablet (875 mg total) by mouth 2 (two) times daily for 10 days.  F/u prn

## 2024-07-08 ENCOUNTER — Other Ambulatory Visit: Payer: Self-pay

## 2024-07-08 NOTE — Patient Instructions (Signed)
 Visit Information  Thank you for taking time to visit with me today. Please don't hesitate to contact me if I can be of assistance to you before our next scheduled appointment.  Your next care management appointment is by telephone on Monday, December 1 at 10:30 AM  Please call the care guide team at [phone] if you need to cancel, schedule, or reschedule an appointment.   Please call [phone]-TALK (toll free, 24 hour hotline) if you are experiencing a Mental Health or Behavioral Health Crisis or need someone to talk to.  Clayborne Ly RN BSN CCM [place]  [facility], [facility] Health Nurse Care Coordinator  Direct Dial: [phone] Website: [email or web address]

## 2024-07-08 NOTE — Patient Outreach (Signed)
 Complex Care Management   Visit Note  07/08/2024  Name:  Natasha [name] MRN: [id number] DOB: Sep 24, 1950  Situation: Referral received for Complex Care Management related to  Right-Sided Hearted Failure, Chronic Venous Insufficiency, Pelvic Floor dysfunction, Overactive Bladder, recurrent UTI, Obesity, Class III, BMI 40-49.9 (morbid obesity), history of Pulmonary Embolism, Bilateral lymphedema to LE, OSA, Weakness of both lower extremities, Arthritis to both knees, acute pain to right ear. I obtained verbal consent from Patient.  Visit completed with Patient on the phone.  Background:   Past Medical History:  Diagnosis Date   Abdominal spasms 12/14/2020   Acute pain of right shoulder 01/31/2023   Acute pulmonary embolism (HCC) 08/21/2019   Bacterial conjunctivitis of both eyes 12/14/2020   Bilateral leg edema 02/10/2020   Bradycardia    Breast cancer screening by mammogram 11/24/2020   Candida infection 10/11/2022   CARCINOMA, THYROID  GLAND, PAPILLARY 05/04/2008   Stage 2, 8/09: thyroidectomy for 2.7cm papillary adenocarcinoma (t2 n0 mo) 9/09: I-131 rx, 108 [facility] 05/10: tg is neg (ab neg) , total body scan is neg   Chronic right-sided low back pain with right-sided sciatica 11/24/2020   Colon cancer screening 11/24/2020   Colon polyps    Complication of anesthesia    trouble with airway   Cough 12/25/2007   Qualifier: Diagnosis of  By: Germaine LPN, Megan     RNCPI-80 08/19/2019   Decreased ROM of neck 01/31/2023   Diverticulosis    DVT (deep venous thrombosis) (HCC)    Edema 12/22/2008   Encounter to establish care 11/24/2020   Groin pain, chronic, right 11/24/2020   Health care maintenance 11/26/2022   Hip pain 12/22/2020   History of 2019 novel coronavirus disease (COVID-19) 11/09/2019   HYPERTENSION 12/25/2007   HYPOTHYROIDISM, POSTSURGICAL 05/04/2008   LEG PAIN, LEFT 12/09/2008   Low TSH level 12/10/2022   Lumbago with sciatica, right side 01/08/2021   Memory loss due to  medical condition 11/09/2019   Muscle weakness    Nausea and vomiting 05/23/2008   Qualifier: Diagnosis of  By: Kassie MD, Alyce LABOR   Formatting of this note might be different from the original. Qualifier: Diagnosis of  By: Kassie MD, Sean A   Neck pain 01/31/2023   Non-recurrent acute suppurative otitis media of left ear without spontaneous rupture of tympanic membrane 02/17/2023   Numbness and tingling    Obesity, morbid, BMI 50 or higher (HCC) 08/24/2019   OSA (obstructive sleep apnea) 06/15/2013   CPAP   Other fatigue 11/26/2022   Paresthesia 01/04/2020   Peripheral edema 12/22/2008   Qualifier: Diagnosis of  By: Delford, MD, CODY Maude Dunnings    Pneumonia due to COVID-19 virus    Pulmonary embolism (HCC)    Pulmonary embolism (HCC) 08/23/2019   Right lower quadrant pain 11/24/2020   Sciatica of left side 12/04/2011   Skin sensation disturbance 12/05/2008   Qualifier: Diagnosis of  By: Inocencio MD, Berwyn LABOR Deal of this note might be different from the original. Qualifier: Diagnosis of  By: Inocencio MD, Berwyn LABOR   Snoring 12/10/2022   Spasm of muscle of lower back 12/14/2020   Upper back pain 01/31/2023   Vaginal candidiasis 03/16/2021   Weakness 01/04/2020    Assessment: Patient Reported Symptoms:  Cognitive Cognitive Status: Alert and oriented to person, place, and time, Normal speech and language skills Cognitive/Intellectual Conditions Management [RPT]: None reported or documented in medical history or problem list   Health Maintenance Behaviors: Annual physical exam Health  Facilitated by: Rest  Neurological Neurological Review of Symptoms: Headaches Neurological Management Strategies: Medication therapy, Routine screening Neurological Self-Management Outcome: 3 (uncertain)  HEENT HEENT Symptoms Reported: Ear pain HEENT Management Strategies: Medication therapy, Routine screening HEENT Self-Management Outcome: 2 (bad) HEENT Comment: right ear pain     Cardiovascular Cardiovascular Symptoms Reported: Not assessed    Respiratory Respiratory Symptoms Reported: Not assesed    Endocrine Endocrine Symptoms Reported: Not assessed    Gastrointestinal Gastrointestinal Symptoms Reported: Not assessed      Genitourinary Genitourinary Symptoms Reported: Incontinence Genitourinary Management Strategies: Incontinence garment/pad Genitourinary Self-Management Outcome: 3 (uncertain)  Integumentary Integumentary Symptoms Reported: Skin changes, Wound Skin Management Strategies: Dressing changes, Routine screening, Adequate rest, Medical device Skin Self-Management Outcome: 4 (good) Skin Comment: bilateral lymphedema, wound care per lymphedema clinic and home care with lymphedema pumps and leg wraps  Musculoskeletal Musculoskelatal Symptoms Reviewed: Joint pain, Difficulty walking, Limited mobility, Unsteady gait, Weakness Musculoskeletal Management Strategies: Medical device, Routine screening, Adequate rest Musculoskeletal Self-Management Outcome: 3 (uncertain)      Psychosocial Psychosocial Symptoms Reported: Not assessed   Major Change/Loss/Stressor/Fears (CP): Medical condition, self Techniques to Cope with Loss/Stress/Change: Diversional activities Quality of Family Relationships: supportive Do you feel physically threatened by others?: No    07/08/2024    PHQ2-9 Depression Screening   Deanne Bedgood interest or pleasure in doing things    Feeling down, depressed, or hopeless    PHQ-2 - Total Score    Trouble falling or staying asleep, or sleeping too much    Feeling tired or having Munira Polson energy    Poor appetite or overeating     Feeling bad about yourself - or that you are a failure or have let yourself or your family down    Trouble concentrating on things, such as reading the newspaper or watching television    Moving or speaking so slowly that other people could have noticed.  Or the opposite - being so fidgety or restless that you have  been moving around a lot more than usual    Thoughts that you would be better off dead, or hurting yourself in some way    PHQ2-9 Total Score    If you checked off any problems, how difficult have these problems made it for you to do your work, take care of things at home, or get along with other people    Depression Interventions/Treatment      There were no vitals filed for this visit.  Medications Reviewed Today     Reviewed by Morgan Clayborne CROME, RN (Registered Nurse) on 07/08/24 at 1050  Med List Status: <None>   Medication Order Taking? Sig Documenting Provider Last Dose Status Informant  acetaminophen  (TYLENOL ) 650 MG CR tablet [id number]  Take 650 mg by mouth every 8 (eight) hours as needed for pain. [provider]  Active   AMBULATORY JESSE SCHLOSSMAN MEDICATION [id number]  Motorized Scooter based on insurance coverage. Early, Sara E, NP  Active   amoxicillin  (AMOXIL ) 875 MG tablet [id number]  Take 1 tablet (875 mg total) by mouth 2 (two) times daily for 10 days. Tysinger, Alm RAMAN, PA-C  Active   diclofenac  Sodium (VOLTAREN  ARTHRITIS PAIN) 1 % GEL [id number]  Apply 2 g topically 4 (four) times daily. Early, Sara E, NP  Active   Elastic Bandages & Supports (MEDICAL COMPRESSION STOCKINGS) MISC [id number]  Thigh high compression stockings for venous insufficiency and lymphedema. Please measure ankle, calf, and thigh circumference and leg length for fit. Brand per insurance. Requesting  one pair. Early, Sara E, NP  Active   Elastic Bandages & Supports (MEDICAL COMPRESSION STOCKINGS) MISC [id number]  30-40 mmHg knee length compression stockings for venous insufficiency and lymphedema. Please measure ankle and calf circumference and leg length for fit. Brand per insurance coverage. Requesting 3 pair. Early, Sara E, NP  Active   furosemide  (LASIX ) 20 MG tablet [id number]  Take 1 tablet (20 mg total) by mouth 2 (two) times daily. Tysinger, Alm RAMAN, PA-C  Active   levothyroxine  (SYNTHROID ) 125 MCG  tablet [id number]  Take 1 tablet (125 mcg total) by mouth daily. Early, Sara E, NP  Active   magnesium (MAGTAB) 84 MG ( ) TBCR SR tablet [id number]  Take 72 mg by mouth. [provider]  Active   Multiple Vitamin (MULTIVITAMIN WITH MINERALS) TABS tablet [id number]  Take 1 tablet by mouth daily. [provider]  Active Self  OVER THE COUNTER MEDICATION [id number]  Take by mouth daily. Omega XL  Patient is taking 2 gel caps daily [provider]  Active            Med Note DELILA, ADAM D   Mon Jun 07, 2024 11:55 AM) Urolithin a 2000 mg  OVER THE COUNTER MEDICATION [id number]  Take by mouth daily. Osteo Bi Flex patient is taking 2 tablets daily [provider]  Active   potassium chloride  (KLOR-CON ) 10 MEQ tablet [id number]  Take 1 tablet (10 mEq total) by mouth 2 (two) times daily. Tysinger, Alm RAMAN, PA-C  Active   rivaroxaban  (XARELTO ) 20 MG TABS tablet [id number]  Take 1 tablet (20 mg total) by mouth daily with supper. Early, Sara E, NP  Active   tirzepatide [facility]) 2.5 MG/0.5ML Pen [id number]  Inject 2.5 mg into the skin once a week. After 4 weeks, increase to 5mg . Early, Sara E, NP  Active   valsartan  (DIOVAN ) 160 MG tablet [id number]  Take 1 tablet (160 mg total) by mouth daily. Early, Sara E, NP  Active   Vitamin D , Ergocalciferol , (DRISDOL ) 1.25 MG (50000 UNIT) CAPS capsule [id number]  Take 1 capsule (50,000 Units total) by mouth every 7 (seven) days. Early, Sara E, NP  Active             Recommendation:   Specialty provider follow-up   07/14/2024 Status: Sch   Time: 1:00 PM Length: 60  Visit Type: TREATMENT LYMPH [1204] Copay: $0.00  Provider: Myriam Zebedee CROME, OT Department: [facility] SERVICES    Follow Up Plan:   Telephone follow up appointment with social worker Tobias Moose BSW on Monday, 07/12/24 at 11:00 AM Telephone follow up appointment with nurse care manager Clayborne Ly RN date/time:  Monday, December 1 at 10:30 AM  Clayborne Ly  RN BSN CCM [place]  [facility], [facility] Nurse Care Coordinator  Direct Dial: [phone] Website: [email or web address]

## 2024-07-12 ENCOUNTER — Encounter: Admitting: Occupational Therapy

## 2024-07-12 ENCOUNTER — Other Ambulatory Visit: Payer: Self-pay | Admitting: Licensed Clinical Social Worker

## 2024-07-12 ENCOUNTER — Ambulatory Visit: Admitting: Physical Therapy

## 2024-07-12 NOTE — Patient Outreach (Addendum)
 Social Drivers of Health  Community Resource and Care Coordination Visit Note   07/12/2024  Name: Natasha [name] MRN: [id number] DOB:[birth year]  Situation: Referral received for [facility] needs assessment and assistance related to Transportation Medicaid for [facility] services . I obtained verbal consent from Patient.  Visit completed with Patient on the phone.   Background:   SDOH Interventions Today    Flowsheet Row Most Recent Value  SDOH Interventions   Food Insecurity Interventions Community Resources Provided, [employer] Referral  [SW had information resent]  Housing Interventions Intervention Not Indicated  Transportation Interventions SCAT (Specialized Community Area Transporation), Statistician Referral, [employer] Resources Provided  [employer] information by mail and email]  Utilities Interventions Intervention Not Indicated     Assessment:   Goals Addressed             This Visit's Progress    BSW VBCI Social Work Care Plan       Problems:   Food Insecurity  and emergency alert and referral for bars in the bathroom   CSW Clinical Goal(s):   Over the next 2 weeks the Patient will will follow up with [facility] and Aging gracefully as directed by Social Work.  Interventions:  SW will mail resources for food pantry, Aging Gracefully  and the emergency alert system  Patient Goals/Self-Care Activities:  Follow up with food pantry  for community food options.and referred to the [facility] Walk-in clinic for them to call patient and referred to Find Help  Plan:   Telephone follow up appointment with care management team member scheduled for:  07/27/2024 at 10:30 am        Recommendation:   attend all scheduled provider appointments ask for help if you don't understand your health insurance benefits Made a referral to the walk in clinic for someone to call the patient and made a referral to find help. Patient also stated that she was in pain in her  leg and back and SW sent the [facility] and the PCP an in box message .   Follow Up Plan:   Telephone follow up appointment date/time:  07/27/2024 at 10:30 am  Tobias CHARM Maranda HEDWIG, PhD [facility], [facility] Social Worker Direct Dial: [phone]  Fax: [phone]

## 2024-07-12 NOTE — Patient Instructions (Signed)
 Visit Information  Thank you for taking time to visit with me today. Please don't hesitate to contact me if I can be of assistance to you before our next scheduled appointment.  Your next care management appointment is by telephone on 07/27/2024 at 10:30 am   Please call the care guide team at [phone] if you need to cancel, schedule, or reschedule an appointment.   Please call the Suicide and Crisis Lifeline: 988 go to [facility] Urgent [facility] [address], [place] [phone]) call 911 if you are experiencing a Mental Health or Behavioral Health Crisis or need someone to talk to.  Tobias CHARM Maranda HEDWIG, PhD [facility], [facility] Social Worker Direct Dial: [phone]  Fax: [phone]

## 2024-07-14 ENCOUNTER — Ambulatory Visit: Admitting: Occupational Therapy

## 2024-07-14 ENCOUNTER — Ambulatory Visit

## 2024-07-14 ENCOUNTER — Telehealth: Payer: Self-pay | Admitting: Nurse Practitioner

## 2024-07-14 DIAGNOSIS — I10 Essential (primary) hypertension: Secondary | ICD-10-CM | POA: Diagnosis not present

## 2024-07-14 DIAGNOSIS — H9201 Otalgia, right ear: Secondary | ICD-10-CM | POA: Diagnosis not present

## 2024-07-14 DIAGNOSIS — Z981 Arthrodesis status: Secondary | ICD-10-CM | POA: Diagnosis not present

## 2024-07-14 DIAGNOSIS — M542 Cervicalgia: Secondary | ICD-10-CM | POA: Diagnosis not present

## 2024-07-14 NOTE — Telephone Encounter (Signed)
 Copied from CRM [id number]. Topic: General - Other >> Jul 14, 2024 12:56 PM Natasha [name] wrote: Reason for CRM: Pt is wanting to know if high blood pressure machine and cuffs are available for pick up. Please advise [phone]  Advised rep from call center that BP machine is here for pt

## 2024-07-19 ENCOUNTER — Ambulatory Visit: Admitting: Physical Therapy

## 2024-07-19 ENCOUNTER — Ambulatory Visit: Admitting: Occupational Therapy

## 2024-07-21 ENCOUNTER — Ambulatory Visit: Attending: Nurse Practitioner | Admitting: Occupational Therapy

## 2024-07-21 ENCOUNTER — Encounter: Payer: Self-pay | Admitting: Occupational Therapy

## 2024-07-21 ENCOUNTER — Ambulatory Visit: Admitting: Physical Therapy

## 2024-07-21 DIAGNOSIS — M6281 Muscle weakness (generalized): Secondary | ICD-10-CM | POA: Insufficient documentation

## 2024-07-21 DIAGNOSIS — R262 Difficulty in walking, not elsewhere classified: Secondary | ICD-10-CM | POA: Diagnosis not present

## 2024-07-21 DIAGNOSIS — R2689 Other abnormalities of gait and mobility: Secondary | ICD-10-CM | POA: Insufficient documentation

## 2024-07-21 DIAGNOSIS — I890 Lymphedema, not elsewhere classified: Secondary | ICD-10-CM | POA: Insufficient documentation

## 2024-07-21 NOTE — Therapy (Signed)
 OUTPATIENT OCCUPATIONAL THERAPY TREATMENT NOTE AND PROGRESS REPORT  BILATERAL LOWER EXTREMITY/ BLQ LYMPHEDEMA  Patient Name: Natasha [name] MRN: [id number] DOB:1951/01/23, 73 y.o., female Today's Date: 07/21/2024  REPORTING PERIOD: 04/14/24 - 06/30/24  END OF SESSION:  OT End of Session - 07/21/24 1307     Visit Number 21    Number of Visits 36    Date for Recertification  08/24/24    OT Start Time 0105    OT Stop Time 0210    OT Time Calculation (min) 65 min    Activity Tolerance Patient tolerated treatment well;No increased pain    Behavior During Therapy [facility] for tasks assessed/performed             Past Medical History:  Diagnosis Date   Abdominal spasms 12/14/2020   Acute pain of right shoulder 01/31/2023   Acute pulmonary embolism (HCC) 08/21/2019   Bacterial conjunctivitis of both eyes 12/14/2020   Bilateral leg edema 02/10/2020   Bradycardia    Breast cancer screening by mammogram 11/24/2020   Candida infection 10/11/2022   CARCINOMA, THYROID  GLAND, PAPILLARY 05/04/2008   Stage 2, 8/09: thyroidectomy for 2.7cm papillary adenocarcinoma (t2 n0 mo) 9/09: I-131 rx, 108 mci 05/10: tg is neg (ab neg) , total body scan is neg   Chronic right-sided low back pain with right-sided sciatica 11/24/2020   Colon cancer screening 11/24/2020   Colon polyps    Complication of anesthesia    trouble with airway   Cough 12/25/2007   Qualifier: Diagnosis of  By: Germaine LPN, Megan     RNCPI-80 08/19/2019   Decreased ROM of neck 01/31/2023   Diverticulosis    DVT (deep venous thrombosis) (HCC)    Edema 12/22/2008   Encounter to establish care 11/24/2020   Groin pain, chronic, right 11/24/2020   Health care maintenance 11/26/2022   Hip pain 12/22/2020   History of 2019 novel coronavirus disease (COVID-19) 11/09/2019   HYPERTENSION 12/25/2007   HYPOTHYROIDISM, POSTSURGICAL 05/04/2008   LEG PAIN, LEFT 12/09/2008   Low TSH level 12/10/2022   Lumbago with sciatica, right side  01/08/2021   Memory loss due to medical condition 11/09/2019   Muscle weakness    Nausea and vomiting 05/23/2008   Qualifier: Diagnosis of  By: Kassie MD, Alyce LABOR   Formatting of this note might be different from the original. Qualifier: Diagnosis of  By: Kassie MD, Sean A   Neck pain 01/31/2023   Non-recurrent acute suppurative otitis media of left ear without spontaneous rupture of tympanic membrane 02/17/2023   Numbness and tingling    Obesity, morbid, BMI 50 or higher (HCC) 08/24/2019   OSA (obstructive sleep apnea) 06/15/2013   CPAP   Other fatigue 11/26/2022   Paresthesia 01/04/2020   Peripheral edema 12/22/2008   Qualifier: Diagnosis of  By: Delford, MD, CODY Maude Dunnings    Pneumonia due to COVID-19 virus    Pulmonary embolism (HCC)    Pulmonary embolism (HCC) 08/23/2019   Right lower quadrant pain 11/24/2020   Sciatica of left side 12/04/2011   Skin sensation disturbance 12/05/2008   Qualifier: Diagnosis of  By: Inocencio MD, Berwyn LABOR Deal of this note might be different from the original. Qualifier: Diagnosis of  By: Inocencio MD, Valerie A   Snoring 12/10/2022   Spasm of muscle of lower back 12/14/2020   Upper back pain 01/31/2023   Vaginal candidiasis 03/16/2021   Weakness 01/04/2020   Past Surgical History:  Procedure Laterality Date   ABDOMINAL HYSTERECTOMY  due to bleeding, ovaries remain   ANKLE SURGERY Right    CERVICAL FUSION     x 2   COLONOSCOPY WITH PROPOFOL  N/A 08/08/2015   Procedure: COLONOSCOPY WITH PROPOFOL ;  Surgeon: Renaye Sous, MD;  Location: [facility];  Service: Endoscopy;  Laterality: N/A;   KNEE SURGERY Left    TOTAL THYROIDECTOMY     Patient Active Problem List   Diagnosis Date Noted   Other skin changes 06/16/2024   Fall 06/07/2024   Numerous skin moles 06/07/2024   Pain in both lower extremities 06/07/2024   Anemia 03/04/2024   Chronic venous insufficiency 09/10/2023   Atrophic vaginitis 05/14/2023   Pelvic floor  dysfunction 05/14/2023   Pancreatic insufficiency 05/14/2023   Arthritis of knee 03/24/2023   At risk for fall due to comorbid condition 03/10/2023   Mood changes 02/17/2023   Recurrent urinary tract infection 01/31/2023   Walker as ambulation aid 01/31/2023   B12 deficiency 11/26/2022   Prediabetes 11/26/2022   Obesity, Class III, BMI 40-49.9 (morbid obesity) (HCC) 11/26/2022   Dermatofibroma 10/11/2022   Fatty liver 04/20/2021   Incontinence of urine in female 04/20/2021   Vitamin D  deficiency 03/16/2021   Constipation 11/24/2020   Gastroesophageal reflux disease 11/24/2020   OAB (overactive bladder) 11/24/2020   Aortic atherosclerosis 11/24/2020   Right-sided heart failure (HCC) 11/24/2020   History of pulmonary embolism 02/10/2020   Lymphedema 02/10/2020   Mobility impaired 01/04/2020   Muscle weakness (generalized) 11/09/2019   Pulmonary nodule 11/09/2019   OSA (obstructive sleep apnea) 06/15/2013   Allergic rhinitis 01/21/2009   History of papillary adenocarcinoma of thyroid  05/04/2008   Hypothyroidism, postsurgical 05/04/2008   Essential hypertension 12/25/2007   SOB (shortness of breath) on exertion 12/25/2007    PCP: Camie Doing, NP  REFERRING PROVIDER: Camie Doing, NP  REFERRING DIAG: I89.0  THERAPY DIAG:  Lymphedema, not elsewhere classified  Rationale for Evaluation and Treatment: Rehabilitation  ONSET DATE: >15 years  SUBJECTIVE:                                                                                                                                                                                          SUBJECTIVE STATEMENT: [name] presents to OT in manual transport wc for LE care. Pt is not wearing compression leggings on the R leg and she is without compression wraps on the left.  Pt states she is doing well with the new compression legging on the right leg. She was last here for treatment on 10/29. Pt states she has been sick with a very sore  area of the neck behind her ear. She does not rate  pain numerically.   PERTINENT HISTORY:  Hypothyroidism, postsurgical Essential hypertension Dyspnea OSA (obstructive sleep apnea) Obesity, morbid, BMI 50 or higher (HCC) Muscle weakness (generalized) Gait abnormality History of pulmonary embolism Bilateral lower extremity edema Chronic right-sided low back pain with right-sided sciatica Right-sided heart failure (HCC) Hip pain Incontinence of urine in female Dermatofibroma Morbid obesity (HCC) BMI 50.0-59.9, adult (HCC) SOB (shortness of breath) on exertion Other fatigue Prediabetes Decreased ROM of neck Upper back pain Acute pain of right shoulder Recurrent urinary tract infection Urine frequency Walker as ambulation aid Ambulatory dysfunction Mood changes At risk for fall due to comorbid condition Arthritis of knee Hypothyroidism Chronic venous insufficiency Mobility impaired Weakness of both lower extremities Gait instability Lymphedema BMI 45.0-49.9, adult (HCC)  PAIN:  Are you having pain? Yes, B knees 5/10, post auricular area: not rated Pain description: creaky crackly, tight, heavy, sore, tender, burning. With and without weight bearing Aggravating factors: standing, walking, weight bearing Relieving factors: walking  PRECAUTIONS: Other: LYMPHEDEMA PRECAUTIONS: prediabetic, hypothyroid, Fall Risk  RED FLAGS: Urinary incontinence; numbness and tingling in fingers, hands and toes   WEIGHT BEARING RESTRICTIONS: No  FALLS:  Has patient fallen in last 6 months? No  LIVING ENVIRONMENT: Lives with: lives with their daughter and  granddaughter Lives in: House/apartment Stairs: Yes; Internal: 14 steps; on right going up and External: garage 4 steps steps; can reach both Has following equipment at home: Walker - 4 wheeled, shower chair, and elevated toilet seat  OCCUPATION: retired careers information officer professor  LEISURE: concerts, writing and producing  plays  HAND DOMINANCE: right   PRIOR LEVEL OF FUNCTION: Independent  PATIENT GOALS: walk unassisted; lose weight   OBJECTIVE: Note: Objective measures were completed at Evaluation unless otherwise noted.  COGNITION:  Overall cognitive status: Within functional limits for tasks assessed   OBSERVATIONS / OTHER ASSESSMENTS:   POSTURE: head forward, slight shoulder protraction  LE ROM: Limited at hips knees and ankles 2/2 body habitus and skin approximation  LE MMT: generalized weakness  LYMPHEDEMA ASSESSMENTS:   BLE COMPARATIVE LIMB VOLUMETRICS: Initial 02/17/24  LANDMARK RIGHT   R LEG (A-D) 6530.2 ml  R THIGH (E-G) ml  R FULL LIMB (A-G) ml  Limb Volume differential (LVD)  %  Volume change since initial %  Volume change overall V  (Blank rows = not tested)  LANDMARK LEFT  L LEG (A-D) 6601.0  L  THIGH (E-G) ml  L  FULL LIMB (A-G) ml  Limb Volume differential (LVD)  1.08% L>R  Volume change since initial %  Volume change overall %  (Blank rows = not tested)    RLE COMPARATIVE LIMB VOLUMETRICS: 9th visit 03/31/24   LANDMARK RIGHT   R LEG (A-D) 4941.4 ml  R THIGH (E-G) ml  R FULL LIMB (A-G) ml  Limb Volume differential (LVD)  %  Volume change since initial R LEG volume DECREASED by 24.3% since commencing OT for CDT on 02/17/24   Volume change overall V  (Blank rows = not tested)  BLE COMPARATIVE LIMB VOLUMETRICS: 20 th visit progress note- TBA next visit  LANDMARK RIGHT   R LEG (A-D)  ml  R THIGH (E-G) ml  R FULL LIMB (A-G) ml  Limb Volume differential (LVD)  %  Volume change since initial %  Volume change overall V  (Blank rows = not tested)  LANDMARK LEFT  L LEG (A-D)   L  THIGH (E-G) ml  L  FULL LIMB (A-G) ml  Limb Volume differential (LVD)  %  Volume change since initial %  Volume change overall %  (Blank rows = not tested)      SKIN/TISSUE INTEGRITY: : Moderate, Stage  II, Bilateral Lower Extremity Lymphedema 2/2 CVI,  Obesity, and Mm  weakness  Skin  Description Hyper-Keratosis Peau d' Orange Shiny Tight Fibrotic/ Indurated Fatty Hard Spongy/ boggy   x   x R>L x x x   Skin dry Flaky WNL Macerated   mildly      Color Redness Varicosities Blanching Hemosiderin Stain Mottled        x   Odor Malodorous Yeast Fungal infection  WNL      x   Temperature Warm Cool wnl    x     Pitting Edema   1+ 2+ 3+ 4+ Non-pitting   x         Girth Symmetrical Asymmetrical                   Distribution    R>L toes to groin, bilaterally    Stemmer Sign Positive Negative   +    Lymphorrhea History Of:  Present Absent     x    Wounds History Of Present Absent Venous Arterial Pressure Sheer   denies  x        Signs of Infection Redness Warmth Erythema Acute Swelling Drainage Borders                    Sensation Light Touch Deep pressure Hypersensitivity   In tact Impaired In tact Impaired Absent Impaired   x  x  x     Nails WNL   Fungus nail dystrophy   TBA     Hair Growth Symmetrical Asymmetrical   TB Ax    Skin Creases Base of toes  Ankles   Base of Fingers knees       Abdominal pannus Thigh Lobules  Face/neck   x x  x       GAIT: Pt transported to clinic in transport wheelchair Distance walked: Pt able to transfer out of wc and walk 4-5 steps using 2 wheeled walker to Rx bed with extra time (modified independent).  Assistive device utilized: Pt able to lift legs onto Rx bed, one at a time, using hands to actively assist Level of assistance: Modified independence Comments: Transfers  and bed mobility take an inordinate amount of time  LYMPHEDEMA LIFE IMPACT SCALE (LLIS): Initial: 86.76% (The extent to which LE-related problems impacted your life over the past week.)                                                                                                                           TREATMENT THIS DATE: RLE MLD Pt/ CG edu R LEG multilayer compression rapping.  PATIENT EDUCATION:   Continued Pt/ CG edu for lymphedema self care home program throughout session. Topics include outcome of comparative limb volumetrics- starting limb volume differentials (LVDs), technology and gradient  techniques used for short stretch, multilayer compression wrapping, simple self-MLD, therapeutic lymphatic pumping exercises, skin/nail care, LE precautions, compression garment recommendations and specifications, wear and care schedule and compression garment donning / doffing w assistive devices. Discussed progress towards all OT goals since commencing CDT. Discussed detrimental impact of obesity on lower and upper extremity lymphedema over time. Reviewed OT goals for lymphedema care with Pt and discussed progress to date.  All questions answered to the Pt's satisfaction. Good return. Person educated: Patient and family Education method: Explanation, Demonstration, and Handouts Education comprehension: verbalized understanding, returned demonstration, verbal cues required, and needs further education  LYMPHEDEMA SELF-CARE HOME PROGRAM: BLE lymphatic pumping there ex- 1 set of 10 each element, in order. Hold 5. 2 x daily 2. Daily, short stretch, thigh length, multilayer compression bandages during Intensive Phase CDT 3. During self-Management Phase fit with appropriate compression garments and/ or devices (Custom CircAid Juxtafit Essential A-D) 3. Daily skin care with low ph lotion matching skin ph 4. Daily simple self MLD   ASSESSMENT:  CLINICAL IMPRESSION: Pt non compliant with compression wraps. She has minimal assistance with applying compression wraps. She has made minimal progress with volume and sensory symptom reduction on the LLE. RLE is marginally managed. Pt does have somewhat inconsistent assistance w the garments. Completed anatomical measurements for the LLE CircAids and we're unable to fit Pt in off the shelf size on this limb at present. She tells me she is going to wrap the leg as  much as possible daily    between now and her next session when we can re measure in an effort to fit with OTS. Applied compression wraps as established to the LLE. Cont as per POC.   (02/12/24 Initial OT Eval : COLLINS DIMARIA is a 73 yo female presenting with moderate, stage  II, bilateral lower extremity lymphedema 2/2 CVI,  obesity, and mm weakness. She will benefit from skilled Occupational Therapy to reduce limb swelling and associated pain, to limit progression and infection risk. BLE lymphedema limits functional performance in all occupational domains, including functional mobility and ambulation,  basic and instrumental ADLs, productive activities, leisure pursuits, social participation and quality of life. This patient will benefit from modified Intensive and Self Management Phase CDT . In addition to MLD, ther ex, skin care and compression bandaging below the knees, Pt will be fitted with custom knee length compression stockings paired with off the shelf , Capri length compression leggings. If insurance benefits allow, she may undergo a trial in the clinic with the advanced Flexitouch device in an effort to reduce discomfort and reduce infection risk. Without skilled OT Li-lymphedema will worsen over time and further functional decline is expected.  Ms Drummonds is unable to reach feet and distal legs to apply multilayer , gradient compression wraps during the Intensive Phase of CDT.  She understands this limitation and agrees to  arrange for a caregiver to assist her daily with compression wrapping between OT visits. With a caregiver to assist her with Intensive Phase compression her prognosis is fair. Without CG assistance her prognosis is poor.)  OBJECTIVE IMPAIRMENTS: decreased activity tolerance, decreased balance, decreased endurance, decreased knowledge of condition, decreased knowledge of use of DME, decreased mobility, difficulty walking, decreased ROM, decreased strength, increased edema,  impaired UE functional use, postural dysfunction, obesity, pain, and chronic leg swelling.   ACTIVITY LIMITATIONS: ACTIVITY LIMITATIONS: Mobility and functional ambulation limitations:  abnormal gait pattern, difficulty walking, carrying, lifting, bending, sitting, standing, squatting, stairs, transfers, and bed  mobility Basic and instrumental ADLs (reaching feet and distal legs to groom nails, inspect skin, apply lotion, bathe lower body, difficulty with LB dressing, including fitting LB clothing, shoes and socks, impaired sleeping, meal prep, standing to cook, driving, shopping, yard work, house work Paediatric Nurse activities: work related activities requiring extended standing, walking, sitting; caring for others Social participation in the community, socializing with others, LE affects body image  Leisure pursuits requiring extended standing, walking, sitting  PERSONAL FACTORS: Fitness, Time since onset of injury/illness/exacerbation, and 3+ comorbidities: Obesity, arthritis, OSA are also affecting patient's functional outcome.   REHAB POTENTIAL: Fair with daily caregiver assistance with gradient compression wrapping. Without assistance prognosis is poor as Pt is unable to apply wraps independently  EVALUATION COMPLEXITY: Moderate   GOALS: Goals reviewed with patient? Yes   SHORT TERM GOALS: Target date: 4th OT Rx visit  Pt will demonstrate understanding of lymphedema precautions and prevention strategies with modified independence using a printed reference to identify at least 5 precautions and discussing how s/he may implement them into daily life to reduce risk of progression with modified assistance Baseline: max a Goal status:GOAL MET   2.  With Max caregiver assistance Pt will be able to apply multilayer, knee length, compression wraps using gradient techniques to decrease limb volume, to limit infection risk, and to limit lymphedema progression.  Given this patient's Intake score of  tbd % on the Lymphedema Life Impact Scale (LLIS), patient will experience a reduction of at least 5% in her perceived level of functional impairment resulting from lymphedema to improve functional performance and quality of life (QOL). Baseline: Dependent Goal status: 03/31/24 GOAL MET, but caregiver has limited availability to assist     LONG TERM GOALS: Target date: 05/13/24 Given this patient's Intake score of 86.76 % on the Lymphedema Life Impact Scale (LLIS), patient will experience a reduction of at least 10% in her perceived level of functional impairment resulting from lymphedema to improve functional performance and quality of life (QOL).Baseline: tbd Baseline: max a Goal status: PROGRESSING     2.  With modified independence (extra time and assistive devices) Pt will be able to don and doff appropriate compression garments and/or devices to control BLE lymphedema and to limit progression.  Baseline: Dependent Goal status: PROGRESSING   3. Pt will achieve at least a 10% volume reductions bilaterally below the knees to return limb to more typical size and shape, to limit infection risk and LE progression, to decrease pain, to improve function, and to improve body image and QOL. Baseline: Dependent Goal status:PROGRESSING. Volumetrics reveal R LEG volume DECREASED by 24.3% since commencing OT for CDT on 02/17/24. This value meets and exceeds the initial 10% volume reduction goal for the RLE. Goal elevated to 25% bilaterally.    4. Pt will achieve and sustain at least 85% compliance with all LE self-care home program components throughout CDT, including modified simple self-MLD, daily skin care and inspection, lymphatic pumping the ex and appropriate compression to limit lymphedema progression and to limit further functional decline. Baseline: Dependent. (Note: W/out daily assistance with donning/ doffing compression  , prognosis is poor.) Goal status:PROGRESSING.Unfortunately there is  limited caregiver follow thru , so compliance is limited for compression and attendance.    PLAN:  OT FREQUENCY: 2x/week and PRN Reduce OT too 1 x weekly per Pt request due to transportation  OT DURATION: other: 18 weeks and PRN  PLANNED INTERVENTIONS:  Complete Decongestive Therapy (CDT): Manual Lymphatic Drainage (MLD) , Skin care, ther  ex, gradient compression 97110-Therapeutic exercises, 97530- Therapeutic activity, 97535- Self Care, [id number]- Manual therapy, Patient/Family education, Manual lymph drainage, Compression bandaging, DME instructions, and trial with advanced, sequential, pneumatic, compression device (Tactile Medical Flexitouch) Pt will arrange for a caregiver to assist her daily with compression wrapping between OT visits.   PLAN FOR NEXT SESSION:  Pt/ caregiver edu Knee length, Multilayer compression wraps to R leg only MLD Fit RLE CircAid ASAP.  Zebedee Dec, MS, OTR/L, CLT-LANA  '
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
## 2024-07-22 DIAGNOSIS — F411 Generalized anxiety disorder: Secondary | ICD-10-CM | POA: Diagnosis not present

## 2024-07-23 ENCOUNTER — Ambulatory Visit: Payer: Self-pay

## 2024-07-23 NOTE — Telephone Encounter (Signed)
 FYI Only or Action Required?: FYI only for provider: appointment scheduled on 11/24.  Patient was last seen in primary care on 07/07/2024 by Bulah Alm RAMAN, PA-C.  Called Nurse Triage reporting Ear Pain.  Symptoms began several weeks ago.  Interventions attempted: Other: Has been evaluated and finished a course of antibiotics.  Symptoms are: gradually improving.  Triage Disposition: See Physician Within 24 Hours  Patient/caregiver understands and will follow disposition?: Yes      Copied from CRM #[id number]. Topic: Clinical - Red Word Triage >> Jul 23, 2024  3:10 PM Teressa P wrote: [facility] that prompted transfer to Nurse Triage: Severe right ear, behind ear and neck pain.       Reason for Disposition  Earache  (Exceptions: Brief ear pain of lasting less than 60 minutes, or earache occurring during air travel.)  Answer Assessment - Initial Assessment Questions Patient has been seen twice since this began and has finished a course of antibiotics. Appointment scheduled for 11/24 and urgent care and ED precautions have been discussed for the weekend.     1. LOCATION: Which ear is involved?     Right ear  2. ONSET: When did the ear pain start?      2-3 weeks ago  3. SEVERITY: How bad is the pain?  (Scale 1-10; mild, moderate or severe)     Moderate 4. URI SYMPTOMS: Do you have a runny nose or cough?     No 5. FEVER: Do you have a fever? If Yes, ask: What is your temperature, how was it measured, and when did it start?     No 6. CAUSE: Have you been swimming recently?, How often do you use Q-TIPS?, Have you had any recent air travel or scuba diving?     Unsure  7. OTHER SYMPTOMS: Do you have any other symptoms? (e.g., decreased hearing, dizziness, headache, stiff neck, vomiting)     Left sided ear pain, right sided neck pain, right sided facial pain  Protocols used: Earache-A-AH

## 2024-07-23 NOTE — Telephone Encounter (Signed)
 Called Patient scheduled with her provider for 07/26/24

## 2024-07-26 ENCOUNTER — Ambulatory Visit: Admitting: Nurse Practitioner

## 2024-07-26 ENCOUNTER — Other Ambulatory Visit: Payer: Self-pay | Admitting: Nurse Practitioner

## 2024-07-26 ENCOUNTER — Ambulatory Visit: Admitting: Family Medicine

## 2024-07-26 ENCOUNTER — Ambulatory Visit: Admitting: Physical Therapy

## 2024-07-26 ENCOUNTER — Encounter: Payer: Self-pay | Admitting: Nurse Practitioner

## 2024-07-26 ENCOUNTER — Ambulatory Visit: Admitting: Occupational Therapy

## 2024-07-26 VITALS — BP 142/80 | HR 62 | Wt 322.6 lb

## 2024-07-26 DIAGNOSIS — Z7409 Other reduced mobility: Secondary | ICD-10-CM | POA: Diagnosis not present

## 2024-07-26 DIAGNOSIS — M79604 Pain in right leg: Secondary | ICD-10-CM

## 2024-07-26 DIAGNOSIS — I50812 Chronic right heart failure: Secondary | ICD-10-CM

## 2024-07-26 DIAGNOSIS — H66004 Acute suppurative otitis media without spontaneous rupture of ear drum, recurrent, right ear: Secondary | ICD-10-CM

## 2024-07-26 DIAGNOSIS — M79605 Pain in left leg: Secondary | ICD-10-CM

## 2024-07-26 DIAGNOSIS — R0602 Shortness of breath: Secondary | ICD-10-CM

## 2024-07-26 DIAGNOSIS — E66813 Obesity, class 3: Secondary | ICD-10-CM | POA: Diagnosis not present

## 2024-07-26 DIAGNOSIS — I10 Essential (primary) hypertension: Secondary | ICD-10-CM | POA: Diagnosis not present

## 2024-07-26 DIAGNOSIS — R300 Dysuria: Secondary | ICD-10-CM

## 2024-07-26 DIAGNOSIS — Z9181 History of falling: Secondary | ICD-10-CM

## 2024-07-26 DIAGNOSIS — M1710 Unilateral primary osteoarthritis, unspecified knee: Secondary | ICD-10-CM

## 2024-07-26 DIAGNOSIS — I890 Lymphedema, not elsewhere classified: Secondary | ICD-10-CM

## 2024-07-26 LAB — POCT URINALYSIS DIP (CLINITEK)
Bilirubin, UA: NEGATIVE
Blood, UA: NEGATIVE
Glucose, UA: NEGATIVE mg/dL
Ketones, POC UA: NEGATIVE mg/dL
Leukocytes, UA: NEGATIVE
Nitrite, UA: NEGATIVE
POC PROTEIN,UA: NEGATIVE
Spec Grav, UA: 1.01 (ref 1.010–1.025)
Urobilinogen, UA: 0.2 U/dL
pH, UA: 7.5 (ref 5.0–8.0)

## 2024-07-26 MED ORDER — FLUCONAZOLE 150 MG PO TABS: ORAL_TABLET | ORAL | 0 refills | Status: AC

## 2024-07-26 MED ORDER — AZITHROMYCIN 250 MG PO TABS: ORAL_TABLET | ORAL | 0 refills | Status: AC

## 2024-07-26 NOTE — Progress Notes (Signed)
 Natasha [name] Doing, DNP, AGNP-c [facility] [address] [place], [place] [address] [phone]   ACUTE VISIT on 07/26/2024  Blood pressure (!) 142/80, pulse 62, weight (!) 322 lb 9.6 oz (146.3 kg), SpO2 96%.  Subjective:  HPI  History of Present Illness Natasha [name] is a 73 year old female who presents with ear pain and associated symptoms. She is also experiencing significant leg pain and weakness.   She has been experiencing significant pain in her right ear since November 1st, which originally radiated to the back of the ear, up to her face, and down her neck. The pain is described as feeling like 'somebody hit me with a brick.' She has been taking Tylenol  for arthritis, which provides some relief but does not completely alleviate the pain.   She completed two rounds of antibiotics, initially amoxicillin  for about six or seven days, followed by a cephalosporin prescribed by urgent care. Despite these treatments, the pain persists. An x-ray of her head showed no abnormalities, and there was no visible infection in the ear that she is aware of.   About a week ago, the left ear began hurting, and she has been experiencing chills and fever. She also receives lymph massages, which she wonders might be related to her symptoms. The pain is primarily in the ears and down the neck along the area of the eustachian tube.   She reports pain with urination and lower back pain.  She mentions an elevated blood pressure reading of 180/80 at urgent care.   She is dissatisfied with her current physical therapy and is considering home health services due to difficulty attending sessions. Her legs feel heavy and stiff, and she is unable to ambulate safely, even with a walker. She reports significant difficulty getting out of a chair and a history of frequent falls while ambulating or attempting to get up.  She has been using a brace and pumps to manage the symptoms of lymphedema in the  legs along with wrapping of the left leg. The right leg is much better than the left, which is the leg she has a brace for. She is hoping to be measured for a brace for the left leg next week. She is concerned with her ability to continue to get to PT due to her weakness and falls. She must have someone transport her for these appointments and this is quite difficult. She requests home health PT evaluation.   She has a history of multiple neck and spine surgeries, and there is concern about a device implanted in her neck during the last surgery. She is also dealing with arthritis pain in her legs, for which she takes arthritis Tylenol . She has tried various treatments for her arthritis, including knee shots and topical Voltaren , with limited success.   ROS negative except for what is listed in HPI. History, Medications, Surgery, SDOH, and Family History reviewed and updated as appropriate.  Objective:  Physical Exam Vitals and nursing note reviewed.  Constitutional:      General: She is not in acute distress.    Appearance: She is obese. She is not ill-appearing or toxic-appearing.  HENT:     Head: Normocephalic and atraumatic.     Right Ear: There is impacted cerumen.     Left Ear: There is impacted cerumen.     Ears:     Comments: Impacted cerumen cleared bilaterally. Right TM erythema, bulging, and cloudyeffusion. Left TM bulging with clear effusion present.  Nose: Nose normal.     Mouth/Throat:     Mouth: Mucous membranes are moist.     Pharynx: Oropharynx is clear.  Eyes:     Pupils: Pupils are equal, round, and reactive to light.  Cardiovascular:     Rate and Rhythm: Normal rate and regular rhythm.     Pulses: Normal pulses.  Pulmonary:     Effort: Pulmonary effort is normal.  Musculoskeletal:     Cervical back: No rigidity or tenderness.     Right lower leg: Edema present.     Left lower leg: Edema present.  Lymphadenopathy:     Cervical: No cervical adenopathy.  Skin:     General: Skin is warm and dry.     Capillary Refill: Capillary refill takes less than 2 seconds.  Neurological:     Mental Status: She is alert and oriented to person, place, and time.     Cranial Nerves: No cranial nerve deficit.     Sensory: No sensory deficit.     Motor: Weakness present.     Coordination: Coordination abnormal.     Gait: Gait abnormal.     Deep Tendon Reflexes: Reflexes normal.  Psychiatric:        Mood and Affect: Mood normal.         Assessment & Plan:   Problem List Items Addressed This Visit     Essential hypertension   Blood pressure elevated at 142/80, possibly due to pain and stress. No home monitoring currently in place, but she did recently get a BP cuff. Recommend monitoring BP at home daily for 2 weeks and report findings via MyChart for review. If BP at home consistently >130/85, will increase valsartan .  - Encouraged home blood pressure monitoring.      Right-sided heart failure (HCC)   Edema present in the LE's, but unclear if this is related to lymphedema or fluid overload. No significant overnight weight gain reported, which is reassuring. Patient is ShOB with exertion, but she is also severely deconditioned and is using a great deal of effort to ambulate.       Relevant Orders   Ambulatory referral to Home Health   Recurrent acute suppurative otitis media of right ear without spontaneous rupture of tympanic membrane - Primary   Persistent right ear pain with tenderness and soreness, now present in the left ear, as well. Previous treatments with amoxicillin  and ceftriaxone  were ineffective. Examination revealed cloudy effusion and erythema of the right eardrum and after removal of significant cerumen impaction. Left ear also had significant cerumen impaction that required removal. TM shows clear effusion without erythema. Differential includes atypical or possibly fungal infection due to lack of response to standard antibiotics. - Prescribed  azithromycin  for atypical infection. - Prescribed fluconazole  for potential fungal component. - Performed ear irrigation to remove cerumen impaction.      Relevant Medications   azithromycin  (ZITHROMAX ) 250 MG tablet   fluconazole  (DIFLUCAN ) 150 MG tablet   At risk for fall due to comorbid condition   Chronic lower extremity weakness and heaviness, described as feeling like spider webs, in the setting of lymphedema and severe deconditioning. Physical therapy has been ineffective thus far with little to no change reported by the patient. Getting to the appointments is quite difficult, requiring someone to drive her and pick her up, as well as assist her in and out of the house. She uses a brace and compression pumps, which provide some relief. She has had numerous falls, with  the most recent within the last week, while out of the home. She is unable to rise from a chair without assistance. Ambulation takes a significant amount of time with shuffling gait slow movement. Home health evaluation requested due to difficulty traveling to PT sessions. - Ordered home health evaluation for physical therapy. - Continue use of compression pumps and brace.      Arthritis of knee   Chronic osteoarthritis with knee pain. Previous knee injections were ineffective and possibly worsened symptoms. Tylenol  and Voltaren  topical provide some relief when used consistently. - Continue Tylenol  and Voltaren  topical for pain management.      Mobility impaired   Chronic mobility concerns with significant decline noted in the last 1-2 years. Patient currently ambulating with the aid of a front wheel walker. Gait is very slow taking 5 minutes to ambulate from the exam room to the lobby. She has clear difficulty lifting her feet, with noted shuffling observed. Difficulty also present with getting out of the chair without significant time, effort, and assistance. Concern is present for the high risk of falls related to weakness  and deconditioning in her legs associated with lymphedema and chronic swelling. To reduce the risk of falls, I strongly recommend PT to help with strength and gait training, as well as evaluation for assistive devices that may help to make ambulation safer. Leaving the home is quite difficult and has becoming increasingly so over the last few visits. I do feel that in home therapy is the most appropriate option to ensure consistent therapy and reduce the risk of missed appointments. PT order placed.       Lymphedema   Chronic lymphedema with left leg worse than the right. She does have a brace for the right leg, which I suspect has improved those symptoms. She is unable to wrap the leg independently and lives alone, which makes it difficult for care of her condition. She is hoping to get measured for a brace for the left leg next week with PT. She would like to see about home health PT to aid in management of the condition to reduce her risks of falls and injury due to severe mobility concerns. I agree that her level of mobility is severely reduced and she is at a significant risk of falls due to pain, leg heaviness, deconditioning, and weakness. Referral placed.      Obesity, Class III, BMI 40-49.9 (morbid obesity) (HCC)   Obesity contributing to knee pain and mobility issues. Difficulty losing weight due to limited ability for ambulation and exercise. Recommend ongoing dietary monitoring.       Relevant Orders   Ambulatory referral to Home Health   Pain in both lower extremities   Chronic pain and weakness contributing to ambulation difficulties and falls. PT ordered.       Relevant Orders   Ambulatory referral to Home Health   SOB (shortness of breath) on exertion   Relevant Orders   Ambulatory referral to Home Health   Other Visit Diagnoses       Dysuria       Relevant Orders   POCT URINALYSIS DIP (CLINITEK) (Completed)     UA normal. Increase water intake recommended.    Natasha [name] Doing, DNP, AGNP-c Time: 61 minutes, >50% spent counseling, care coordination, chart review, and documentation.

## 2024-07-26 NOTE — Assessment & Plan Note (Signed)
 Blood pressure elevated at 142/80, possibly due to pain and stress. No home monitoring currently in place, but she did recently get a BP cuff. Recommend monitoring BP at home daily for 2 weeks and report findings via MyChart for review. If BP at home consistently >130/85, will increase valsartan .  - Encouraged home blood pressure monitoring.

## 2024-07-26 NOTE — Assessment & Plan Note (Signed)
 Chronic pain and weakness contributing to ambulation difficulties and falls. PT ordered.

## 2024-07-26 NOTE — Assessment & Plan Note (Addendum)
 Chronic mobility concerns with significant decline noted in the last 1-2 years. Patient currently ambulating with the aid of a front wheel walker. Gait is very slow taking 5 minutes to ambulate from the exam room to the lobby. She has clear difficulty lifting her feet, with noted shuffling observed. Difficulty also present with getting out of the chair without significant time, effort, and assistance. Concern is present for the high risk of falls related to weakness and deconditioning in her legs associated with lymphedema and chronic swelling. To reduce the risk of falls, I strongly recommend PT to help with strength and gait training, as well as evaluation for assistive devices that may help to make ambulation safer. Leaving the home is quite difficult and has becoming increasingly so over the last few visits. I do feel that in home therapy is the most appropriate option to ensure consistent therapy and reduce the risk of missed appointments. PT order placed.

## 2024-07-26 NOTE — Assessment & Plan Note (Signed)
 Obesity contributing to knee pain and mobility issues. Difficulty losing weight due to limited ability for ambulation and exercise. Recommend ongoing dietary monitoring.

## 2024-07-26 NOTE — Assessment & Plan Note (Signed)
 Edema present in the LE's, but unclear if this is related to lymphedema or fluid overload. No significant overnight weight gain reported, which is reassuring. Patient is ShOB with exertion, but she is also severely deconditioned and is using a great deal of effort to ambulate.

## 2024-07-26 NOTE — Assessment & Plan Note (Signed)
 Chronic lymphedema with left leg worse than the right. She does have a brace for the right leg, which I suspect has improved those symptoms. She is unable to wrap the leg independently and lives alone, which makes it difficult for care of her condition. She is hoping to get measured for a brace for the left leg next week with PT. She would like to see about home health PT to aid in management of the condition to reduce her risks of falls and injury due to severe mobility concerns. I agree that her level of mobility is severely reduced and she is at a significant risk of falls due to pain, leg heaviness, deconditioning, and weakness. Referral placed.

## 2024-07-26 NOTE — Assessment & Plan Note (Signed)
 Persistent right ear pain with tenderness and soreness, now present in the left ear, as well. Previous treatments with amoxicillin  and ceftriaxone  were ineffective. Examination revealed cloudy effusion and erythema of the right eardrum and after removal of significant cerumen impaction. Left ear also had significant cerumen impaction that required removal. TM shows clear effusion without erythema. Differential includes atypical or possibly fungal infection due to lack of response to standard antibiotics. - Prescribed azithromycin  for atypical infection. - Prescribed fluconazole  for potential fungal component. - Performed ear irrigation to remove cerumen impaction.

## 2024-07-26 NOTE — Assessment & Plan Note (Signed)
 Chronic lower extremity weakness and heaviness, described as feeling like spider webs, in the setting of lymphedema and severe deconditioning. Physical therapy has been ineffective thus far with little to no change reported by the patient. Getting to the appointments is quite difficult, requiring someone to drive her and pick her up, as well as assist her in and out of the house. She uses a brace and compression pumps, which provide some relief. She has had numerous falls, with the most recent within the last week, while out of the home. She is unable to rise from a chair without assistance. Ambulation takes a significant amount of time with shuffling gait slow movement. Home health evaluation requested due to difficulty traveling to PT sessions. - Ordered home health evaluation for physical therapy. - Continue use of compression pumps and brace.

## 2024-07-26 NOTE — Patient Instructions (Addendum)
 I would like you to monitor your blood pressure at home for the next 2 weeks and let me know what the readings are so we can see where they are at home. If your blood pressures are staying high, we can increase your medication.  I will send the order for home health evaluation.

## 2024-07-26 NOTE — Assessment & Plan Note (Signed)
 Chronic osteoarthritis with knee pain. Previous knee injections were ineffective and possibly worsened symptoms. Tylenol  and Voltaren  topical provide some relief when used consistently. - Continue Tylenol  and Voltaren  topical for pain management.

## 2024-07-27 ENCOUNTER — Encounter: Payer: Self-pay | Admitting: Licensed Clinical Social Worker

## 2024-07-27 ENCOUNTER — Other Ambulatory Visit: Payer: Self-pay | Admitting: Licensed Clinical Social Worker

## 2024-07-27 NOTE — Patient Instructions (Signed)
 [name] - I am sorry I was unable to reach you today for our scheduled appointment. I work with Early, Camie BRAVO, NP and am calling to support your healthcare needs. Please contact me at [phone] at your earliest convenience. I look forward to speaking with you soon.   Thank you,  Tobias CHARM Maranda HEDWIG, PhD [facility], [facility] Social Worker Direct Dial: [phone]  Fax: [phone]

## 2024-07-28 ENCOUNTER — Ambulatory Visit: Admitting: Occupational Therapy

## 2024-07-28 ENCOUNTER — Ambulatory Visit: Admitting: Physical Therapy

## 2024-08-02 ENCOUNTER — Ambulatory Visit: Admitting: Physical Therapy

## 2024-08-02 ENCOUNTER — Ambulatory Visit: Admitting: Occupational Therapy

## 2024-08-02 ENCOUNTER — Other Ambulatory Visit: Payer: Self-pay

## 2024-08-02 NOTE — Patient Instructions (Signed)
 Visit Information  Thank you for taking time to visit with me today. Please don't hesitate to contact me if I can be of assistance to you before our next scheduled appointment.  Your next care management appointment is by telephone on Friday, January 9 at 10:30 AM  Please call the care guide team at [phone] if you need to cancel, schedule, or reschedule an appointment.   Please call [phone]-TALK (toll free, 24 hour hotline) if you are experiencing a Mental Health or Behavioral Health Crisis or need someone to talk to.  Clayborne Ly RN BSN CCM [place]  [facility], [facility] Health Nurse Care Coordinator  Direct Dial: [phone] Website: [email or web address]

## 2024-08-02 NOTE — Patient Outreach (Signed)
 Complex Care Management   Visit Note  08/02/2024  Name:  Natasha [name] MRN: [id number] DOB: [birth year]  Situation: Referral received for Complex Care Management related to   Right-Sided Hearted Failure, Chronic Venous Insufficiency, Pelvic Floor dysfunction, Overactive Bladder, recurrent UTI, Obesity, Class III, BMI 40-49.9 (morbid obesity), history of Pulmonary Embolism, Bilateral lymphedema to LE, OSA, Weakness of both lower extremities, Arthritis to both knees. I obtained verbal consent from Patient.  Visit completed with Patient on the phone.  Background:   Past Medical History:  Diagnosis Date   Abdominal spasms 12/14/2020   Acute pain of right shoulder 01/31/2023   Acute pulmonary embolism (HCC) 08/21/2019   Bacterial conjunctivitis of both eyes 12/14/2020   Bilateral leg edema 02/10/2020   Bradycardia    Breast cancer screening by mammogram 11/24/2020   Candida infection 10/11/2022   CARCINOMA, THYROID  GLAND, PAPILLARY 05/04/2008   Stage 2, 8/09: thyroidectomy for 2.7cm papillary adenocarcinoma (t2 n0 mo) 9/09: I-131 rx, 108 [facility] 05/10: tg is neg (ab neg) , total body scan is neg   Chronic right-sided low back pain with right-sided sciatica 11/24/2020   Colon cancer screening 11/24/2020   Colon polyps    Complication of anesthesia    trouble with airway   Cough 12/25/2007   Qualifier: Diagnosis of  By: Germaine LPN, Megan     RNCPI-80 08/19/2019   Decreased ROM of neck 01/31/2023   Diverticulosis    DVT (deep venous thrombosis) (HCC)    Edema 12/22/2008   Encounter to establish care 11/24/2020   Groin pain, chronic, right 11/24/2020   Health care maintenance 11/26/2022   Hip pain 12/22/2020   History of 2019 novel coronavirus disease (COVID-19) 11/09/2019   HYPERTENSION 12/25/2007   HYPOTHYROIDISM, POSTSURGICAL 05/04/2008   LEG PAIN, LEFT 12/09/2008   Low TSH level 12/10/2022   Lumbago with sciatica, right side 01/08/2021   Memory loss due to medical condition  11/09/2019   Muscle weakness    Nausea and vomiting 05/23/2008   Qualifier: Diagnosis of  By: Kassie MD, Alyce LABOR   Formatting of this note might be different from the original. Qualifier: Diagnosis of  By: Kassie MD, Sean A   Neck pain 01/31/2023   Non-recurrent acute suppurative otitis media of left ear without spontaneous rupture of tympanic membrane 02/17/2023   Numbness and tingling    Obesity, morbid, BMI 50 or higher (HCC) 08/24/2019   OSA (obstructive sleep apnea) 06/15/2013   CPAP   Other fatigue 11/26/2022   Other skin changes 06/16/2024   Paresthesia 01/04/2020   Peripheral edema 12/22/2008   Qualifier: Diagnosis of  By: Delford, MD, CODY Maude Dunnings    Pneumonia due to COVID-19 virus    Pulmonary embolism (HCC)    Pulmonary embolism (HCC) 08/23/2019   Recurrent urinary tract infection 01/31/2023   03/04/2024- consider prophylactic treatment if more than 6 per year.      Right lower quadrant pain 11/24/2020   Sciatica of left side 12/04/2011   Skin sensation disturbance 12/05/2008   Qualifier: Diagnosis of  By: Inocencio MD, Berwyn LABOR Deal of this note might be different from the original. Qualifier: Diagnosis of  By: Inocencio MD, Berwyn LABOR   Snoring 12/10/2022   Spasm of muscle of lower back 12/14/2020   Upper back pain 01/31/2023   Vaginal candidiasis 03/16/2021   Weakness 01/04/2020    Assessment: Patient Reported Symptoms:  Cognitive Cognitive Status: Alert and oriented to person, place, and time, Normal speech and language  skills Cognitive/Intellectual Conditions Management [RPT]: None reported or documented in medical history or problem list   Health Maintenance Behaviors: Annual physical exam Health Facilitated by: Rest  Neurological Neurological Review of Symptoms: Not assessed    HEENT HEENT Symptoms Reported: No symptoms reported HEENT Comment: right ear infection resolved    Cardiovascular Cardiovascular Symptoms Reported: Swelling in legs or  feet Does patient have uncontrolled Hypertension?: No Cardiovascular Management Strategies: Routine screening, Adequate rest Cardiovascular Self-Management Outcome: 4 (good) Cardiovascular Comment: bilateral lymphedema to both legs  Respiratory Respiratory Symptoms Reported: Other: Other Respiratory Symptoms: Alteration in sleep pattern due to OSA, patient will contact the DME company to f/u on her CPAP machine Respiratory Management Strategies: CPAP, Routine screening Respiratory Self-Management Outcome: 3 (uncertain)  Endocrine Endocrine Symptoms Reported: Not assessed    Gastrointestinal Gastrointestinal Symptoms Reported: Not assessed      Genitourinary Genitourinary Symptoms Reported: Frequency, Incontinence, Urgency Additional Genitourinary Details: patient missed her f/u appointment to work on pelvic floor exercises Genitourinary Management Strategies: Incontinence garment/pad Genitourinary Self-Management Outcome: 3 (uncertain)  Integumentary Integumentary Symptoms Reported: Skin changes, Wound Other Integumentary Symptoms: lymphedema to bilateral legs, leg wraps, lymphedema pumps Skin Management Strategies: Routine screening, Dressing changes Skin Self-Management Outcome: 4 (good)  Musculoskeletal Musculoskelatal Symptoms Reviewed: Not assessed        Psychosocial Psychosocial Symptoms Reported: No symptoms reported   Major Change/Loss/Stressor/Fears (CP): Medical condition, self Techniques to Cope with Loss/Stress/Change: Diversional activities Quality of Family Relationships: helpful, supportive, involved Do you feel physically threatened by others?: No    08/02/2024    PHQ2-9 Depression Screening   Natasha [name] interest or pleasure in doing things    Feeling down, depressed, or hopeless    PHQ-2 - Total Score    Trouble falling or staying asleep, or sleeping too much    Feeling tired or having Natasha [name] energy    Poor appetite or overeating     Feeling bad about yourself -  or that you are a failure or have let yourself or your family down    Trouble concentrating on things, such as reading the newspaper or watching television    Moving or speaking so slowly that other people could have noticed.  Or the opposite - being so fidgety or restless that you have been moving around a lot more than usual    Thoughts that you would be better off dead, or hurting yourself in some way    PHQ2-9 Total Score    If you checked off any problems, how difficult have these problems made it for you to do your work, take care of things at home, or get along with other people    Depression Interventions/Treatment      There were no vitals filed for this visit. Pain Scale: Not given for pain  Medications Reviewed Today     Reviewed by Morgan Clayborne CROME, RN (Registered Nurse) on 08/02/24 at 1043  Med List Status: <None>   Medication Order Taking? Sig Documenting Provider Last Dose Status Informant  acetaminophen  (TYLENOL ) 650 MG CR tablet [id number]  Take 650 mg by mouth every 8 (eight) hours as needed for pain. [provider]  Active   AMBULATORY JESSE SCHLOSSMAN MEDICATION [id number]  Motorized Scooter based on insurance coverage. Early, Sara E, NP  Active   diclofenac  Sodium (VOLTAREN  ARTHRITIS PAIN) 1 % GEL [id number]  Apply 2 g topically 4 (four) times daily. Early, Sara E, NP  Active   Elastic Bandages & Supports (MEDICAL COMPRESSION STOCKINGS) MISC [id number]  Thigh high compression stockings for venous insufficiency and lymphedema. Please measure ankle, calf, and thigh circumference and leg length for fit. Brand per insurance. Requesting one pair. Early, Sara E, NP  Active   Elastic Bandages & Supports (MEDICAL COMPRESSION STOCKINGS) MISC [id number]  30-40 mmHg knee length compression stockings for venous insufficiency and lymphedema. Please measure ankle and calf circumference and leg length for fit. Brand per insurance coverage. Requesting 3 pair. Early, Sara E, NP  Active    fluconazole  (DIFLUCAN ) 150 MG tablet [id number]  Take 1 tablet by mouth today, 1 tablet in 3 days and the last tablet in 6 days. Early, Sara E, NP  Active   furosemide  (LASIX ) 20 MG tablet [id number]  Take 1 tablet (20 mg total) by mouth 2 (two) times daily.  Patient taking differently: Take 20 mg by mouth 2 (two) times daily. Prn as needed   Bulah Alm GORMAN DEVONNA  Active   levothyroxine  (SYNTHROID ) 125 MCG tablet [id number]  Take 1 tablet (125 mcg total) by mouth daily. Early, Sara E, NP  Active   magnesium (MAGTAB) 84 MG ( ) TBCR SR tablet [id number]  Take 72 mg by mouth. [provider]  Active   Multiple Vitamin (MULTIVITAMIN WITH MINERALS) TABS tablet [id number]  Take 1 tablet by mouth daily. [provider]  Active Self  OVER THE COUNTER MEDICATION [id number]  Take by mouth daily. Omega XL  Patient is taking 2 gel caps daily [provider]  Active            Med Note DELILA, ADAM D   Mon Jun 07, 2024 11:55 AM) Urolithin a 2000 mg  OVER THE COUNTER MEDICATION [id number]  Take by mouth daily. Osteo Bi Flex patient is taking 2 tablets daily [provider]  Active   potassium chloride  (KLOR-CON ) 10 MEQ tablet [id number]  Take 1 tablet (10 mEq total) by mouth 2 (two) times daily. Tysinger, Alm GORMAN, PA-C  Active   rivaroxaban  (XARELTO ) 20 MG TABS tablet [id number]  Take 1 tablet (20 mg total) by mouth daily with supper. Early, Sara E, NP  Active   tirzepatide  (ZEPBOUND ) 2.5 MG/0.5ML Pen [id number]  Inject 2.5 mg into the skin once a week. After 4 weeks, increase to 5mg .  Patient not taking: Reported on 07/26/2024   Oris Camie BRAVO, NP  Active   valsartan  (DIOVAN ) 160 MG tablet [id number]  Take 1 tablet (160 mg total) by mouth daily. Early, Sara E, NP  Active   Vitamin D , Ergocalciferol , (DRISDOL ) 1.25 MG (50000 UNIT) CAPS capsule [id number]  Take 1 capsule (50,000 Units total) by mouth every 7 (seven) days. Early, Sara E, NP  Active             Recommendation:    Specialty provider follow-up    08/04/2024 Status: Sch   Time: 1:00 PM Length: 60  Visit Type: TREATMENT LYMPH [1204] Copay: $0.00  Provider: Myriam Zebedee CROME, OT Department: [facility] SERVICES    08/04/2024 Status: Sch   Time: 2:00 PM Length: 45  Visit Type: NEURO PT TREATMENT [1191] Copay: $40.00  Provider: Viktoria Massie BRAVO, PT Department: [facility] SERVICES    08/11/2024 Status: Sch   Time: 1:00 PM Length: 60  Visit Type: TREATMENT LYMPH [1204] Copay: $0.00  Provider: Myriam Zebedee CROME, OT Department: [facility] SERVICES    09/07/2024 Status: Sch   Time: 2:00 PM Length: 20  Visit Type: OFFICE VISIT [1004] Copay: $40.00  Provider: Lonni Slain, MD Department: [facility]  Follow Up Plan:   Telephone follow up appointment date/time:    08/09/2024 Status: Sch   Time: 2:00 PM Length: 30  Visit Type: PATIENT OUTREACH 30 [3016] Copay: $0.00  Provider: Maranda Lister D Department: [facility]    08/12/2024 Status: Sch   Time: 10:00 AM Length: 30  Visit Type: PATIENT OUTREACH 30 [3016] Copay: $0.00  Provider: PFM-PHARMACIST Department: [facility]    09/10/2024 Status: Sch   Time: 10:30 AM Length: 30  Visit Type: VBCI TELEPHONE CALL 30 [2502] Copay: $0.00  Provider: Morgan Clayborne CROME, RN Department: [facility]   Clayborne Morgan RN BSN CCM [place]  [facility], [facility] Nurse Care Coordinator  Direct Dial: [phone] Website: [email or web address]

## 2024-08-04 ENCOUNTER — Ambulatory Visit: Attending: Nurse Practitioner | Admitting: Occupational Therapy

## 2024-08-04 ENCOUNTER — Ambulatory Visit: Admitting: Physical Therapy

## 2024-08-04 ENCOUNTER — Encounter: Payer: Self-pay | Admitting: Physical Therapy

## 2024-08-04 ENCOUNTER — Encounter: Payer: Self-pay | Admitting: Occupational Therapy

## 2024-08-04 DIAGNOSIS — M25561 Pain in right knee: Secondary | ICD-10-CM | POA: Insufficient documentation

## 2024-08-04 DIAGNOSIS — R2689 Other abnormalities of gait and mobility: Secondary | ICD-10-CM

## 2024-08-04 DIAGNOSIS — M6281 Muscle weakness (generalized): Secondary | ICD-10-CM | POA: Insufficient documentation

## 2024-08-04 DIAGNOSIS — R260 Ataxic gait: Secondary | ICD-10-CM | POA: Diagnosis present

## 2024-08-04 DIAGNOSIS — M25562 Pain in left knee: Secondary | ICD-10-CM | POA: Diagnosis present

## 2024-08-04 DIAGNOSIS — G8929 Other chronic pain: Secondary | ICD-10-CM | POA: Diagnosis present

## 2024-08-04 DIAGNOSIS — R262 Difficulty in walking, not elsewhere classified: Secondary | ICD-10-CM | POA: Insufficient documentation

## 2024-08-04 DIAGNOSIS — I890 Lymphedema, not elsewhere classified: Secondary | ICD-10-CM | POA: Diagnosis present
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
## 2024-08-04 NOTE — Therapy (Signed)
 OUTPATIENT OCCUPATIONAL THERAPY TREATMENT NOTE AND DISCHARGE SUMMARY  BILATERAL LOWER EXTREMITY/ BLQ LYMPHEDEMA  Patient Name: Natasha [name] MRN: [id number] DOB:[birth year], 73 y.o., female Today's Date: 08/04/2024  REPORTING PERIOD:   END OF SESSION:  OT End of Session - 08/04/24 1318     Visit Number 22    Number of Visits 36    Date for Recertification  08/24/24    OT Start Time 0108    OT Stop Time 0210    OT Time Calculation (min) 62 min    Activity Tolerance Patient tolerated treatment well;No increased pain    Behavior During Therapy [name] for tasks assessed/performed             Past Medical History:  Diagnosis Date   Abdominal spasms 12/14/2020   Acute pain of right shoulder 01/31/2023   Acute pulmonary embolism (HCC) 08/21/2019   Bacterial conjunctivitis of both eyes 12/14/2020   Bilateral leg edema 02/10/2020   Bradycardia    Breast cancer screening by mammogram 11/24/2020   Candida infection 10/11/2022   CARCINOMA, THYROID  GLAND, PAPILLARY 05/04/2008   Stage 2, 8/09: thyroidectomy for 2.7cm papillary adenocarcinoma (t2 n0 mo) 9/09: I-131 rx, 108 [facility] 05/10: tg is neg (ab neg) , total body scan is neg   Chronic right-sided low back pain with right-sided sciatica 11/24/2020   Colon cancer screening 11/24/2020   Colon polyps    Complication of anesthesia    trouble with airway   Cough 12/25/2007   Qualifier: Diagnosis of  By: Germaine LPN, Megan     RNCPI-80 08/19/2019   Decreased ROM of neck 01/31/2023   Diverticulosis    DVT (deep venous thrombosis) (HCC)    Edema 12/22/2008   Encounter to establish care 11/24/2020   Groin pain, chronic, right 11/24/2020   Health care maintenance 11/26/2022   Hip pain 12/22/2020   History of 2019 novel coronavirus disease (COVID-19) 11/09/2019   HYPERTENSION 12/25/2007   HYPOTHYROIDISM, POSTSURGICAL 05/04/2008   LEG PAIN, LEFT 12/09/2008   Low TSH level 12/10/2022   Lumbago with sciatica, right side 01/08/2021    Memory loss due to medical condition 11/09/2019   Muscle weakness    Nausea and vomiting 05/23/2008   Qualifier: Diagnosis of  By: Kassie MD, Alyce LABOR   Formatting of this note might be different from the original. Qualifier: Diagnosis of  By: Kassie MD, Sean A   Neck pain 01/31/2023   Non-recurrent acute suppurative otitis media of left ear without spontaneous rupture of tympanic membrane 02/17/2023   Numbness and tingling    Obesity, morbid, BMI 50 or higher (HCC) 08/24/2019   OSA (obstructive sleep apnea) 06/15/2013   CPAP   Other fatigue 11/26/2022   Other skin changes 06/16/2024   Paresthesia 01/04/2020   Peripheral edema 12/22/2008   Qualifier: Diagnosis of  By: Delford, MD, CODY Maude Dunnings    Pneumonia due to COVID-19 virus    Pulmonary embolism (HCC)    Pulmonary embolism (HCC) 08/23/2019   Recurrent urinary tract infection 01/31/2023   03/04/2024- consider prophylactic treatment if more than 6 per year.      Right lower quadrant pain 11/24/2020   Sciatica of left side 12/04/2011   Skin sensation disturbance 12/05/2008   Qualifier: Diagnosis of  By: Inocencio MD, Berwyn LABOR Deal of this note might be different from the original. Qualifier: Diagnosis of  By: Inocencio MD, Berwyn LABOR   Snoring 12/10/2022   Spasm of muscle of lower back 12/14/2020  Upper back pain 01/31/2023   Vaginal candidiasis 03/16/2021   Weakness 01/04/2020   Past Surgical History:  Procedure Laterality Date   ABDOMINAL HYSTERECTOMY     due to bleeding, ovaries remain   ANKLE SURGERY Right    CERVICAL FUSION     x 2   COLONOSCOPY WITH PROPOFOL  N/A 08/08/2015   Procedure: COLONOSCOPY WITH PROPOFOL ;  Surgeon: Renaye Sous, MD;  Location: [facility];  Service: Endoscopy;  Laterality: N/A;   KNEE SURGERY Left    TOTAL THYROIDECTOMY     Patient Active Problem List   Diagnosis Date Noted   Fall 06/07/2024   Numerous skin moles 06/07/2024   Pain in both lower extremities 06/07/2024   Anemia  03/04/2024   Chronic venous insufficiency 09/10/2023   Atrophic vaginitis 05/14/2023   Pancreatic insufficiency 05/14/2023   Arthritis of knee 03/24/2023   At risk for fall due to comorbid condition 03/10/2023   Recurrent acute suppurative otitis media of right ear without spontaneous rupture of tympanic membrane 02/17/2023   Mood changes 02/17/2023   B12 deficiency 11/26/2022   Prediabetes 11/26/2022   Obesity, Class III, BMI 40-49.9 (morbid obesity) (HCC) 11/26/2022   Dermatofibroma 10/11/2022   Fatty liver 04/20/2021   Vitamin D  deficiency 03/16/2021   Constipation 11/24/2020   Gastroesophageal reflux disease 11/24/2020   Aortic atherosclerosis 11/24/2020   Right-sided heart failure (HCC) 11/24/2020   Incontinence of urine in female 11/24/2020   History of pulmonary embolism 02/10/2020   Lymphedema 02/10/2020   Pulmonary nodule 11/09/2019   Mobility impaired 11/09/2019   OSA (obstructive sleep apnea) 06/15/2013   Allergic rhinitis 01/21/2009   History of papillary adenocarcinoma of thyroid  05/04/2008   Hypothyroidism, postsurgical 05/04/2008   Essential hypertension 12/25/2007   SOB (shortness of breath) on exertion 12/25/2007    PCP: Camie Doing, NP  REFERRING PROVIDER: Camie Doing, NP  REFERRING DIAG: I89.0  THERAPY DIAG:  Lymphedema, not elsewhere classified  Rationale for Evaluation and Treatment: Rehabilitation  ONSET DATE: >15 years  SUBJECTIVE:                                                                                                                                                                                          SUBJECTIVE STATEMENT: [name] presents to OT in manual transport wc for LE care. Pt is wearing compression leggings on the R leg and she is without compression on the left.  Pt states she is doing well with the new compression legging on the right leg and is ready yo order the 2nd garment for the L LEG. Pt reports transportation to the  clinic has become very difficult,  and her daughter is unable to assist her most days .    PERTINENT HISTORY:  Hypothyroidism, postsurgical Essential hypertension Dyspnea OSA (obstructive sleep apnea) Obesity, morbid, BMI 50 or higher (HCC) Muscle weakness (generalized) Gait abnormality History of pulmonary embolism Bilateral lower extremity edema Chronic right-sided low back pain with right-sided sciatica Right-sided heart failure (HCC) Hip pain Incontinence of urine in female Dermatofibroma Morbid obesity (HCC) BMI 50.0-59.9, adult (HCC) SOB (shortness of breath) on exertion Other fatigue Prediabetes Decreased ROM of neck Upper back pain Acute pain of right shoulder Recurrent urinary tract infection Urine frequency Walker as ambulation aid Ambulatory dysfunction Mood changes At risk for fall due to comorbid condition Arthritis of knee Hypothyroidism Chronic venous insufficiency Mobility impaired Weakness of both lower extremities Gait instability Lymphedema BMI 45.0-49.9, adult (HCC)  PAIN:  Are you having pain? Yes, B knees , not rated Pain description: creaky crackly, tight, heavy, sore, tender, burning. With and without weight bearing Aggravating factors: standing, walking, weight bearing Relieving factors: walking  PRECAUTIONS: Other: LYMPHEDEMA PRECAUTIONS: prediabetic, hypothyroid, Fall Risk  RED FLAGS: Urinary incontinence; numbness and tingling in fingers, hands and toes   WEIGHT BEARING RESTRICTIONS: No  FALLS:  Has patient fallen in last 6 months? No  LIVING ENVIRONMENT: Lives with: lives with their daughter and  granddaughter Lives in: House/apartment Stairs: Yes; Internal: 14 steps; on right going up and External: garage 4 steps steps; can reach both Has following equipment at home: Walker - 4 wheeled, shower chair, and elevated toilet seat  OCCUPATION: retired careers information officer professor  LEISURE: concerts, writing and producing plays  HAND  DOMINANCE: right   PRIOR LEVEL OF FUNCTION: Independent  PATIENT GOALS: walk unassisted; lose weight   OBJECTIVE: Note: Objective measures were completed at Evaluation unless otherwise noted.  COGNITION:  Overall cognitive status: Within functional limits for tasks assessed   OBSERVATIONS / OTHER ASSESSMENTS:   POSTURE: head forward, slight shoulder protraction  LE ROM: Limited at hips knees and ankles 2/2 body habitus and skin approximation  LE MMT: generalized weakness  LYMPHEDEMA ASSESSMENTS:   BLE COMPARATIVE LIMB VOLUMETRICS: Initial 02/17/24  LANDMARK RIGHT   R LEG (A-D) 6530.2 ml  R THIGH (E-G) ml  R FULL LIMB (A-G) ml  Limb Volume differential (LVD)  %  Volume change since initial %  Volume change overall V  (Blank rows = not tested)  LANDMARK LEFT  L LEG (A-D) 6601.0  L  THIGH (E-G) ml  L  FULL LIMB (A-G) ml  Limb Volume differential (LVD)  1.08% L>R  Volume change since initial %  Volume change overall %  (Blank rows = not tested)    RLE COMPARATIVE LIMB VOLUMETRICS: 9th visit 03/31/24   LANDMARK RIGHT   R LEG (A-D) 4941.4 ml  R THIGH (E-G) ml  R FULL LIMB (A-G) ml  Limb Volume differential (LVD)  %  Volume change since initial R LEG volume DECREASED by 24.3% since commencing OT for CDT on 02/17/24   Volume change overall V  (Blank rows = not tested)  BLE COMPARATIVE LIMB VOLUMETRICS: 20 th visit progress note- TBA next visit  LANDMARK RIGHT   R LEG (A-D)  ml  R THIGH (E-G) ml  R FULL LIMB (A-G) ml  Limb Volume differential (LVD)  %  Volume change since initial %  Volume change overall V  (Blank rows = not tested)  LANDMARK LEFT  L LEG (A-D)   L  THIGH (E-G) ml  L  FULL LIMB (A-G) ml  Limb Volume differential (LVD)  %  Volume change since initial %  Volume change overall %  (Blank rows = not tested)      SKIN/TISSUE INTEGRITY: : Moderate, Stage  II, Bilateral Lower Extremity Lymphedema 2/2 CVI,  Obesity, and Mm weakness  Skin   Description Hyper-Keratosis Peau d' Orange Shiny Tight Fibrotic/ Indurated Fatty Hard Spongy/ boggy   x   x R>L x x x   Skin dry Flaky WNL Macerated   mildly      Color Redness Varicosities Blanching Hemosiderin Stain Mottled        x   Odor Malodorous Yeast Fungal infection  WNL      x   Temperature Warm Cool wnl    x     Pitting Edema   1+ 2+ 3+ 4+ Non-pitting   x         Girth Symmetrical Asymmetrical                   Distribution    R>L toes to groin, bilaterally    Stemmer Sign Positive Negative   +    Lymphorrhea History Of:  Present Absent     x    Wounds History Of Present Absent Venous Arterial Pressure Sheer   denies  x        Signs of Infection Redness Warmth Erythema Acute Swelling Drainage Borders                    Sensation Light Touch Deep pressure Hypersensitivity   In tact Impaired In tact Impaired Absent Impaired   x  x  x     Nails WNL   Fungus nail dystrophy   TBA     Hair Growth Symmetrical Asymmetrical   TB Ax    Skin Creases Base of toes  Ankles   Base of Fingers knees       Abdominal pannus Thigh Lobules  Face/neck   x x  x       GAIT: Pt transported to clinic in transport wheelchair Distance walked: Pt able to transfer out of wc and walk 4-5 steps using 2 wheeled walker to Rx bed with extra time (modified independent).  Assistive device utilized: Pt able to lift legs onto Rx bed, one at a time, using hands to actively assist Level of assistance: Modified independence Comments: Transfers  and bed mobility take an inordinate amount of time  LYMPHEDEMA LIFE IMPACT SCALE (LLIS): Initial: 86.76% (The extent to which LE-related problems impacted your life over the past week.)                                                                                                                           TREATMENT THIS DATE: Anatomical measurements for LLE custom CircAid compression garment alternative. Pt/ CG edu   PATIENT  EDUCATION:  Continued Pt/ CG edu for lymphedema self care home program throughout session. Topics include outcome of comparative  limb volumetrics- starting limb volume differentials (LVDs), technology and gradient techniques used for short stretch, multilayer compression wrapping, simple self-MLD, therapeutic lymphatic pumping exercises, skin/nail care, LE precautions, compression garment recommendations and specifications, wear and care schedule and compression garment donning / doffing w assistive devices. Discussed progress towards all OT goals since commencing CDT. Discussed detrimental impact of obesity on lower and upper extremity lymphedema over time. Reviewed OT goals for lymphedema care with Pt and discussed progress to date.  All questions answered to the Pt's satisfaction. Good return. Person educated: Patient and family Education method: Explanation, Demonstration, and Handouts Education comprehension: verbalized understanding, returned demonstration, verbal cues required, and needs further education  LYMPHEDEMA SELF-CARE HOME PROGRAM: BLE lymphatic pumping there ex- 1 set of 10 each element, in order. Hold 5. 2 x daily 2. Daily, short stretch, thigh length, multilayer compression bandages during Intensive Phase CDT 3. During self-Management Phase fit with appropriate compression garments and/ or devices (Custom CircAid Juxtafit Essential A-D) 3. Daily skin care with low ph lotion matching skin ph 4. Daily simple self MLD   ASSESSMENT:  CLINICAL IMPRESSION: Pt non compliant with compression wraps and LE self care between sessions. She has minimal assistance from family caregiver with compression wrapping and garments. She has made minimal progress with volume and sensory symptom reduction on the LLE. RLE is marginally managed. Transportation to the clinic is difficult to arrange and Pt is hoping to commence home health OT and PT.   Completed anatomical measurements for the LLE CircAids as  we're unable to fit Pt in off the shelf size on this limb at present. Completed measurements for custom, LLE compression garment alternative and will submit to  DME vendor after session. Pt is DC from OT for lymphedema care. I wish her the best.    (02/12/24 Initial OT Eval : Natasha [name] is a 73 yo female presenting with moderate, stage  II, bilateral lower extremity lymphedema 2/2 CVI,  obesity, and mm weakness. She will benefit from skilled Occupational Therapy to reduce limb swelling and associated pain, to limit progression and infection risk. BLE lymphedema limits functional performance in all occupational domains, including functional mobility and ambulation,  basic and instrumental ADLs, productive activities, leisure pursuits, social participation and quality of life. This patient will benefit from modified Intensive and Self Management Phase CDT . In addition to MLD, ther ex, skin care and compression bandaging below the knees, Pt will be fitted with custom knee length compression stockings paired with off the shelf , Capri length compression leggings. If insurance benefits allow, she may undergo a trial in the clinic with the advanced Flexitouch device in an effort to reduce discomfort and reduce infection risk. Without skilled OT Li-lymphedema will worsen over time and further functional decline is expected.  Ms Silos is unable to reach feet and distal legs to apply multilayer , gradient compression wraps during the Intensive Phase of CDT.  She understands this limitation and agrees to  arrange for a caregiver to assist her daily with compression wrapping between OT visits. With a caregiver to assist her with Intensive Phase compression her prognosis is fair. Without CG assistance her prognosis is poor.)  OBJECTIVE IMPAIRMENTS: decreased activity tolerance, decreased balance, decreased endurance, decreased knowledge of condition, decreased knowledge of use of DME, decreased mobility, difficulty  walking, decreased ROM, decreased strength, increased edema, impaired UE functional use, postural dysfunction, obesity, pain, and chronic leg swelling.   ACTIVITY LIMITATIONS: ACTIVITY LIMITATIONS: Mobility and functional ambulation limitations:  abnormal gait pattern, difficulty walking, carrying, lifting, bending, sitting, standing, squatting, stairs, transfers, and bed mobility Basic and instrumental ADLs (reaching feet and distal legs to groom nails, inspect skin, apply lotion, bathe lower body, difficulty with LB dressing, including fitting LB clothing, shoes and socks, impaired sleeping, meal prep, standing to cook, driving, shopping, yard work, house work Paediatric Nurse activities: work related activities requiring extended standing, walking, sitting; caring for others Social participation in the community, socializing with others, LE affects body image  Leisure pursuits requiring extended standing, walking, sitting  PERSONAL FACTORS: Fitness, Time since onset of injury/illness/exacerbation, and 3+ comorbidities: Obesity, arthritis, OSA are also affecting patient's functional outcome.   REHAB POTENTIAL: Fair with daily caregiver assistance with gradient compression wrapping. Without assistance prognosis is poor as Pt is unable to apply wraps independently  EVALUATION COMPLEXITY: Moderate   GOALS: Goals reviewed with patient? Yes   SHORT TERM GOALS: Target date: 4th OT Rx visit  Pt will demonstrate understanding of lymphedema precautions and prevention strategies with modified independence using a printed reference to identify at least 5 precautions and discussing how s/he may implement them into daily life to reduce risk of progression with modified assistance Baseline: max a Goal status:GOAL MET   2.  With Max caregiver assistance Pt will be able to apply multilayer, knee length, compression wraps using gradient techniques to decrease limb volume, to limit infection risk, and to limit  lymphedema progression.  Given this patient's Intake score of tbd % on the Lymphedema Life Impact Scale (LLIS), patient will experience a reduction of at least 5% in her perceived level of functional impairment resulting from lymphedema to improve functional performance and quality of life (QOL). Baseline: Dependent Goal status: 03/31/24 GOAL MET, but caregiver assistance is intermittent at best     LONG TERM GOALS: Target date: 05/13/24 Given this patient's Intake score of 86.76 % on the Lymphedema Life Impact Scale (LLIS), patient will experience a reduction of at least 10% in her perceived level of functional impairment resulting from lymphedema to improve functional performance and quality of life (QOL).Baseline: tbd Baseline: max a Goal status: Ending Score = 70.59%. GOAL MET with 18.64% reduction     2.  With modified independence (extra time and assistive devices) Pt will be able to don and doff appropriate compression garments and/or devices to control BLE lymphedema and to limit progression.  Baseline: Dependent Goal status: GOAL PARTIALLY MET.    3. Pt will achieve at least a 10% volume reductions bilaterally below the knees to return limb to more typical size and shape, to limit infection risk and LE progression, to decrease pain, to improve function, and to improve body image and QOL. Baseline: Dependent Goal status:PROGRESSING. Volumetrics reveal R LEG volume DECREASED by 24.3% since commencing OT for CDT on 02/17/24. This value meets and exceeds the initial 10% volume reduction goal for the RLE. Goal elevated to 25% bilaterally.    4. Pt will achieve and sustain at least 85% compliance with all LE self-care home program components throughout CDT, including modified simple self-MLD, daily skin care and inspection, lymphatic pumping the ex and appropriate compression to limit lymphedema progression and to limit further functional decline. Baseline: Dependent. (Note: W/out daily  assistance with donning/ doffing compression  , prognosis is poor.) Goal status:GOAL NOT MET. Unfortunately caregiver assistance is inconsistent and Pt not using compression between sessions.  PLAN:  OT FREQUENCY: 2x/week and PRN Reduce OT too 1 x weekly per Pt request due to transportation  OT DURATION: other: 18 weeks and PRN  PLANNED INTERVENTIONS:  Complete Decongestive Therapy (CDT): Manual Lymphatic Drainage (MLD) , Skin care, ther ex, gradient compression 97110-Therapeutic exercises, 97530- Therapeutic activity, 97535- Self Care, [id number]- Manual therapy, Patient/Family education, Manual lymph drainage, Compression bandaging, DME instructions, and trial with advanced, sequential, pneumatic, compression device (Tactile Medical Flexitouch) Pt will arrange for a caregiver to assist her daily with compression wrapping between OT visits.   PLAN FOR NEXT SESSION:  Pt will follow up with DME vendor [employer] for obtaining LLE compression garment alternative. This garment is like te one she's been trained to use on the other leg. Pt is DC from OT for LE care. Please refer to GOALS section for details of progress to date.  Zebedee Dec, MS, OTR/L, CLT-LANA  '

## 2024-08-09 ENCOUNTER — Other Ambulatory Visit: Payer: Self-pay | Admitting: Licensed Clinical Social Worker

## 2024-08-09 ENCOUNTER — Ambulatory Visit: Admitting: Physical Therapy

## 2024-08-09 ENCOUNTER — Telehealth: Payer: Self-pay

## 2024-08-09 ENCOUNTER — Ambulatory Visit: Admitting: Occupational Therapy

## 2024-08-09 NOTE — Patient Outreach (Signed)
 Social Drivers of Health  Community Resource and Care Coordination Visit Note   08/09/2024  Name: AMI MALLY MRN: [id number] DOB:March 21, 1951  Situation: Referral received for [facility] needs assessment and assistance related to Transportation. I obtained verbal consent from Patient.  Visit completed with Patient on the phone.   Background:      Assessment:   Goals Addressed             This Visit's Progress    COMPLETED: BSW VBCI Social Work Care Plan       Problems:   Food Insecurity  and emergency alert and referral for bars in the bathroom   CSW Clinical Goal(s):   Over the next 2 weeks the Patient will will follow [facility] with [facility] A[facility] gracefully as directed by Social Work.  Interventions:  SW will mail resources for food pantry, Aging Gracefully  and the emergency alert system  Patient Goals/Self-Care Activities:  Follow up with food pantry  for community food options.and referred to the [facility] Walk-in clinic for them to call patient and referred to Find Help  Plan:   Telephone follow up appointment with care management team member scheduled for:  07/27/2024 at 10:30 am        Recommendation:   attend all scheduled provider appointments Patient now has Home care coming into the home to do her PT, they came out today to evaluate and the patient will call about her c-pap machine.   Follow Up Plan:   Patient has achieved all patient stated goals. [employer] will be closed. Patient has been provided contact information should new needs arise.   Tobias CHARM Maranda HEDWIG, PhD [facility], [facility] Social Worker Direct Dial: [phone]  Fax: [phone]

## 2024-08-09 NOTE — Telephone Encounter (Signed)
 Verbal orders given per Camie Doing NP.    Copied from CRM [id number]. Topic: Clinical - Home Health Verbal Orders >> Aug 09, 2024  9:05 AM [name] wrote: Natasha [name] PT calling from [facility] is calling to request order for PT  Frequency- 1 W 1, 2 W 2, 1 W 1, 2 W 5 CB- [phone] Verbal ok on VM

## 2024-08-09 NOTE — Patient Instructions (Signed)

## 2024-08-10 ENCOUNTER — Other Ambulatory Visit: Admitting: Licensed Clinical Social Worker

## 2024-08-10 ENCOUNTER — Telehealth: Payer: Self-pay

## 2024-08-10 NOTE — Telephone Encounter (Signed)
 Copied from CRM #[id number]. Topic: General - Other >> Aug 10, 2024  1:50 PM Joesph B wrote: Reason for CRM: [facility] is reporting patients BP, 146/94.

## 2024-08-11 ENCOUNTER — Ambulatory Visit: Admitting: Occupational Therapy

## 2024-08-11 ENCOUNTER — Ambulatory Visit: Admitting: Physical Therapy

## 2024-08-12 ENCOUNTER — Other Ambulatory Visit

## 2024-08-12 DIAGNOSIS — I10 Essential (primary) hypertension: Secondary | ICD-10-CM

## 2024-08-12 NOTE — Progress Notes (Signed)
° °  08/12/2024 Name: Natasha [name] MRN: [id number] DOB: 1950/09/21  Chief Complaint  Patient presents with   Medication Management   Hypertension    Natasha [name] is a 73 y.o. year old female who presented for a telephone visit.   They were referred to the pharmacist by the [facility] Complex Case Management program for assistance in managing complex medication management.    Subjective:  Care Team: Primary Care Provider: Oris Camie BRAVO, NP ; Next Scheduled Visit: 06/07/24  Medication Access/Adherence  Current Pharmacy:  [phone], [place] - [address] [place] [address] Phone: [phone] Fax: [phone]  [id number] [place] - [address] [place] [phone] Phone: [phone] Fax: [phone]  [place] [facility] [address] [place] [address] Phone: [phone] Fax: [phone]   Patient reports affordability concerns with their medications: No  Patient reports access/transportation concerns to their pharmacy: Yes  Patient reports adherence concerns with their medications:  Yes  admits she does not take all meds as prescribed and sometimes skips days   Hypertension:  Current medications: Valsartan  160mg  daily Medications previously tried: Amlodipine   Patient does have a validated, automated, upper arm home BP cuff Current blood pressure readings readings: Has not started using her BP device yet for home monitoring  Patient denies hypotensive s/sx including dizziness, lightheadedness.  Patient denies hypertensive symptoms including headache, chest pain, shortness of breath      Objective:  Lab Results  Component Value Date   HGBA1C 5.7 (H) 06/07/2024    Lab Results  Component Value Date   CREATININE 0.71 06/07/2024   BUN 11 06/07/2024   NA 140 06/07/2024   K 4.2 06/07/2024   CL 103 06/07/2024   CO2 21 06/07/2024     Lab Results  Component Value Date   CHOL 161 11/26/2022   HDL 83 11/26/2022   LDLCALC 67 11/26/2022   TRIG 52 11/26/2022   CHOLHDL 2.1 10/11/2022    Medications Reviewed Today   Medications were not reviewed in this encounter       Assessment/Plan:   Hypertension: - Currently uncontrolled - Reviewed long term cardiovascular and renal outcomes of uncontrolled blood pressure - Reviewed appropriate blood pressure monitoring technique and reviewed goal blood pressure. Recommended to check home blood pressure and heart rate daily and keep a log - Recommend to continue current medication therapy for now. Need to consider increasing valsartan  if BP readings remain elevated at next f/u call in 2 weeks     Follow Up Plan: 2 weeks  Jon VEAR Lindau, PharmD Clinical Pharmacist [phone]

## 2024-08-16 ENCOUNTER — Ambulatory Visit: Admitting: Physical Therapy

## 2024-08-16 ENCOUNTER — Ambulatory Visit: Admitting: Occupational Therapy

## 2024-08-18 ENCOUNTER — Ambulatory Visit: Admitting: Occupational Therapy

## 2024-08-18 ENCOUNTER — Ambulatory Visit: Admitting: Physical Therapy

## 2024-08-19 ENCOUNTER — Ambulatory Visit: Payer: Self-pay

## 2024-08-19 NOTE — Telephone Encounter (Signed)
 FYI Only or Action Required?: FYI only for provider: appointment scheduled on 09/13/24.  Patient was last seen in primary care on 07/26/2024 by Early, Camie BRAVO, NP.  Called Nurse Triage reporting Hypertension.  Symptoms began today.  Interventions attempted: Prescription medications: valsartan ; lasix .  Symptoms are: unchanged.  Triage Disposition: See PCP Within 2 Weeks  Patient/caregiver understands and will follow disposition?: Yes   Copied from CRM #[id number]. Topic: Clinical - Red Word Triage >> Aug 19, 2024  3:19 PM Kevelyn M wrote: Red Word that prompted transfer to Nurse Triage: Isaiah with [facility] is a PA Assistant and is calling to report a high BP reading for today of 158/98. She wanted to speak a triage nurse.    Reason for Disposition  [1] Systolic BP >= 130 OR Diastolic >= 80 AND [2] taking BP medications  Answer Assessment - Initial Assessment Questions Isaiah, physical therapy assistant, contacted clinic with pt on speaker phone. She stated upon arrival, BP was 158/98 and pt reported taking her medication 1h prior to arrival; pt did report h/a but denies dizziness or new changes in vision. Discussed medication regimen; valsartan  daily, lasix  BID. Pt reports only taking lasix  20mg  once daily; will start BID. Pt going out of town next week and discussed importance of keeping a journal with daily logs of BP and seeking medical attention if symptomatic. Discussed f/u appt with PCP but no appts until 10/2024 and pt already scheduled for then. Recommended sooner appt, pt willing to see alternative provider. Appointment scheduled for evaluation. Patient agrees with plan of care, and will call back if anything changes, or if symptoms worsen.      1. BLOOD PRESSURE: What is your blood pressure? Did you take at least two measurements 5 minutes apart?     Currently 158/98 via automatic home monitor   2. ONSET: When did you take your blood pressure?      Usually in am  3. HOW: How did you take your blood pressure? (e.g., automatic home BP monitor, visiting nurse)     Automatic home BP monitor   4. HISTORY: Do you have a history of high blood pressure?     Yes  5. MEDICINES: Are you taking any medicines for blood pressure? Have you missed any doses recently?     Valsartan  daily; took 1h prior to BP reading of 158/98      Pt also takes lasix  20mg ; when discussing dose and frequency, pt reported she is only taking 1 pill daily. Discussed instructions of 20mg  twice daily. Pt took lasix  while on the phone and stated she will start BID dose today.   6. OTHER SYMPTOMS: Do you have any symptoms? (e.g., blurred vision, chest pain, difficulty breathing, headache, weakness)     Mild h/a for the past couple days; reports having macular degneration so blurred vision is baseline. Denies chest pain or SOB.  Protocols used: Blood Pressure - High-A-AH

## 2024-08-20 NOTE — Telephone Encounter (Signed)
 If this is a one time elevation, then we can see if it comes back down. But if the blood pressure is staying higher than 140/85 on average, I recommend increasing the dose of valsartan .   She can take 2 tablets, which would be the next dose up, and continue to monitor. If she is in agreement, then ok to send a new script.

## 2024-08-23 ENCOUNTER — Ambulatory Visit: Admitting: Occupational Therapy

## 2024-08-23 ENCOUNTER — Ambulatory Visit: Admitting: Physical Therapy

## 2024-08-24 ENCOUNTER — Other Ambulatory Visit

## 2024-08-24 DIAGNOSIS — I10 Essential (primary) hypertension: Secondary | ICD-10-CM

## 2024-08-24 NOTE — Progress Notes (Signed)
 "  08/24/2024 Name: Natasha [name] MRN: [id number] DOB: [birth year]  Chief Complaint  Patient presents with   Medication Management   Hypertension    Natasha [name] is a 73 y.o. year old female who presented for a telephone visit.   They were referred to the pharmacist by the [facility] Complex Case Management program for assistance in managing complex medication management.    Subjective:  Care Team: Primary Care Provider: Oris Camie BRAVO, NP  Medication Access/Adherence  Current Pharmacy[facility]  [phone] [name], [place] - [address] [place] Natasha [name] [address] Phone: [phone] Fax: [phone]  [id number] [place] - [address] [facility] [address] [place] [phone] Phone: [phone] Fax: [phone]  [place] [facility] [address] [place] [name] [address] Phone: [phone] Fax: [phone]   Patient reports affordability concerns with their medications: No  Patient reports access/transportation concerns to their pharmacy: Yes  Patient reports adherence concerns with their medications:  No  Hypertension:  Current medications: Valsartan  160mg  daily Medications previously tried: Amlodipine   Patient does have a validated, automated, upper arm home BP cuff Current blood pressure readings readings: staying elevated above 145/90, got as high as 210/102 per patient but has come down to 140s-150s/90s  Patient denies hypotensive s/sx including dizziness, lightheadedness.  Patient denies hypertensive symptoms including headache, chest pain, shortness of breath      Objective:  Lab Results  Component Value Date   HGBA1C 5.7 (H) 06/07/2024    Lab Results  Component Value Date   CREATININE 0.71 06/07/2024   BUN 11 06/07/2024   NA 140 06/07/2024   K 4.2 06/07/2024   CL 103 06/07/2024   CO2 21 06/07/2024    Lab Results  Component Value Date   CHOL 161 11/26/2022   HDL 83  11/26/2022   LDLCALC 67 11/26/2022   TRIG 52 11/26/2022   CHOLHDL 2.1 10/11/2022    Medications Reviewed Today     Reviewed by Natasha [name], RPH (Pharmacist) on 08/24/24 at 1018  Med List Status: <None>   Medication Order Taking? Sig Documenting Provider Last Dose Status Informant  acetaminophen  (TYLENOL ) 650 MG CR tablet [id number]  Take 650 mg by mouth every 8 (eight) hours as needed for pain. [provider]  Active   AMBULATORY Natasha [name] MEDICATION [id number]  Motorized Scooter based on insurance coverage. Early, Sara E, NP  Active   diclofenac  Sodium (VOLTAREN  ARTHRITIS PAIN) 1 % GEL [id number]  Apply 2 g topically 4 (four) times daily. Early, Sara E, NP  Active   Elastic Bandages & Supports (MEDICAL COMPRESSION STOCKINGS) MISC [id number]  Thigh high compression stockings for venous insufficiency and lymphedema. Please measure ankle, calf, and thigh circumference and leg length for fit. Brand per insurance. Requesting one pair. Early, Sara E, NP  Active   Elastic Bandages & Supports (MEDICAL COMPRESSION STOCKINGS) MISC [id number]  30-40 mmHg knee length compression stockings for venous insufficiency and lymphedema. Please measure ankle and calf circumference and leg length for fit. Brand per insurance coverage. Requesting 3 pair. Early, Sara E, NP  Active   fluconazole  (DIFLUCAN ) 150 MG tablet [id number]  Take 1 tablet by mouth today, 1 tablet in 3 days and the last tablet in 6 days. Early, Sara E, NP  Active   furosemide  (LASIX ) 20 MG tablet [id number]  Take 1 tablet (20 mg total) by mouth 2 (two) times daily.  Patient taking differently:  Take 20 mg by mouth 2 (two) times daily. Prn as needed   Tysinger, David S, PA-C  Active   levothyroxine  (SYNTHROID ) 125 MCG tablet [id number]  Take 1 tablet (125 mcg total) by mouth daily. Early, Sara E, NP  Active   magnesium (MAGTAB) 84 MG ( ) TBCR SR tablet [id number]  Take 72 mg by mouth. [provider]  Active   Multiple  Vitamin (MULTIVITAMIN WITH MINERALS) TABS tablet [id number]  Take 1 tablet by mouth daily. [provider]  Active Self  OVER THE COUNTER MEDICATION [id number]  Take by mouth daily. Omega XL  Patient is taking 2 gel caps daily [provider]  Active            Med Note Natasha [name]   Mon Jun 07, 2024 11:55 AM) Urolithin a 2000 mg  OVER THE COUNTER MEDICATION [id number]  Take by mouth daily. Osteo Bi Flex patient is taking 2 tablets daily [provider]  Active   potassium chloride  (KLOR-CON ) 10 MEQ tablet [id number]  Take 1 tablet (10 mEq total) by mouth 2 (two) times daily. Tysinger, Alm RAMAN, PA-C  Active   rivaroxaban  (XARELTO ) 20 MG TABS tablet [id number]  Take 1 tablet (20 mg total) by mouth daily with supper. Early, Sara E, NP  Active   tirzepatide  (ZEPBOUND ) 2.5 MG/0.5ML Pen [id number]  Inject 2.5 mg into the skin once a week. After 4 weeks, increase to 5mg .  Patient not taking: Reported on 08/12/2024   Oris Camie BRAVO, NP  Active   valsartan  (DIOVAN ) 160 MG tablet [id number]  Take 1 tablet (160 mg total) by mouth daily. Early, Sara E, NP  Active   Vitamin [name] , Ergocalciferol , (DRISDOL ) 1.25 MG (50000 UNIT) CAPS capsule [id number]  Take 1 capsule (50,000 Units total) by mouth every 7 (seven) days. Oris Camie BRAVO, NP  Active               Assessment/Plan:   Hypertension: - Currently uncontrolled - Reviewed long term cardiovascular and renal outcomes of uncontrolled blood pressure - Reviewed appropriate blood pressure monitoring technique and reviewed goal blood pressure. Recommended to check home blood pressure and heart rate daily and keep a log -INCREASE valsartan  to 2 tabs daily starting today, as previously recommended by PCP     Follow Up Plan: 1 week  Jon VEAR Lindau, PharmD Clinical Pharmacist [phone]  "

## 2024-08-25 ENCOUNTER — Ambulatory Visit: Admitting: Occupational Therapy

## 2024-08-25 ENCOUNTER — Ambulatory Visit: Admitting: Physical Therapy

## 2024-08-27 ENCOUNTER — Telehealth: Payer: Self-pay | Admitting: Nurse Practitioner

## 2024-08-27 NOTE — Telephone Encounter (Signed)
 Recommend that she take the increased dose of valsartan  (320mg ) as prescribed and monitor closely.

## 2024-08-27 NOTE — Telephone Encounter (Signed)
 Copied from CRM #[id number]. Topic: Clinical - Medical Advice >> Aug 27, 2024  2:02 PM [name] wrote: Reason for CRM: Isaiah calling from [facility] is calling to report an elevated blood pressure of 148/100 with no symptoms. Patient states that she was to start two doses of the valsartan  (DIOVAN ) 160 MG tablet [[id number]] today. But has not started the 2 doses. With only 1 dose   CB- [phone]

## 2024-08-29 ENCOUNTER — Other Ambulatory Visit: Payer: Self-pay | Admitting: Nurse Practitioner

## 2024-08-29 DIAGNOSIS — M25561 Pain in right knee: Secondary | ICD-10-CM

## 2024-08-30 ENCOUNTER — Ambulatory Visit: Admitting: Occupational Therapy

## 2024-08-30 ENCOUNTER — Telehealth: Payer: Self-pay | Admitting: Internal Medicine

## 2024-08-30 ENCOUNTER — Other Ambulatory Visit: Payer: Self-pay | Admitting: Nurse Practitioner

## 2024-08-30 ENCOUNTER — Ambulatory Visit: Admitting: Physical Therapy

## 2024-08-30 DIAGNOSIS — R600 Localized edema: Secondary | ICD-10-CM

## 2024-08-30 DIAGNOSIS — I10 Essential (primary) hypertension: Secondary | ICD-10-CM

## 2024-08-30 MED ORDER — VALSARTAN 160 MG PO TABS: 160.0000 mg | ORAL_TABLET | Freq: Every day | ORAL | 1 refills | Status: DC

## 2024-08-30 MED ORDER — FUROSEMIDE 20 MG PO TABS: 20.0000 mg | ORAL_TABLET | Freq: Two times a day (BID) | ORAL | 0 refills | Status: AC

## 2024-08-30 NOTE — Telephone Encounter (Signed)
 Copied from CRM #[id number]. Topic: Clinical - Medication Refill >> Aug 30, 2024  1:27 PM Joesph B wrote: Medication:  furosemide  (LASIX ) 20 MG tablet    valsartan  (DIOVAN ) 160 MG tablet (instructed to take twice daily)   Has the patient contacted their pharmacy? Yes (Agent: If no, request that the patient contact the pharmacy for the refill. If patient does not wish to contact the pharmacy document the reason why and proceed with request.) (Agent: If yes, when and what did the pharmacy advise?)  This is the patient's preferred pharmacy:  [phone],  - [address] [place] [address] Phone: [phone] Fax: [phone]    Is this the correct pharmacy for this prescription? Yes If no, delete pharmacy and type the correct one.   Has the prescription been filled recently? Yes  Is the patient out of the medication? Yes  Has the patient been seen for an appointment in the last year OR does the patient have an upcoming appointment? Yes  Can we respond through MyChart? Yes  Agent: Please be advised that Rx refills may take up to 3 business days. We ask that you follow-up with your pharmacy.

## 2024-08-30 NOTE — Telephone Encounter (Signed)
 Copied from CRM #[id number]. Topic: General - Other >> Aug 30, 2024  1:30 PM Joesph NOVAK wrote: Reason for CRM: PT assistant from [facility] is calling to report patients bp today- 138/98.

## 2024-08-30 NOTE — Telephone Encounter (Signed)
 Last appt. 07/26/24.

## 2024-08-31 ENCOUNTER — Other Ambulatory Visit (INDEPENDENT_AMBULATORY_CARE_PROVIDER_SITE_OTHER): Payer: Self-pay

## 2024-08-31 DIAGNOSIS — I10 Essential (primary) hypertension: Secondary | ICD-10-CM

## 2024-08-31 NOTE — Progress Notes (Cosign Needed)
 "  09/01/2024 Name: Natasha [name] MRN: [id number] DOB: 1951/04/03  Chief Complaint  Patient presents with   Hypertension   Medication Management    Natasha [name] is a 73 y.o. year old female who presented for a telephone visit.   They were referred to the pharmacist by the [facility] Complex Case Management program for assistance in managing complex medication management.    Subjective:  Care Team: Primary Care Provider: Oris Camie BRAVO, NP  Medication Access/Adherence  Current [facility]  [phone], [place] - [address] [place] [address] Phone: [phone] Fax: [phone]  [id number] [place] - [address] [place] [phone] Phone: [phone] Fax: [phone]  [place] [facility] [address] [place] [address] Phone: [phone] Fax: [phone]   Patient reports affordability concerns with their medications: No  Patient reports access/transportation concerns to their pharmacy: Yes  Patient reports adherence concerns with their medications:  No  Hypertension:  Current medications: Valsartan  160mg  daily (been taking 2 tablets for last week) Medications previously tried: Amlodipine   Patient does have a validated, automated, upper arm home BP cuff Current blood pressure readings readings: reports staying elevated but some improvement. Last available reading is from PT 138/98  Patient denies hypotensive s/sx including dizziness, lightheadedness.  Patient denies hypertensive symptoms including headache, chest pain, shortness of breath      Objective:  Lab Results  Component Value Date   HGBA1C 5.7 (H) 06/07/2024    Lab Results  Component Value Date   CREATININE 0.71 06/07/2024   BUN 11 06/07/2024   NA 140 06/07/2024   K 4.2 06/07/2024   CL 103 06/07/2024   CO2 21 06/07/2024    Lab Results  Component Value Date   CHOL 161  11/26/2022   HDL 83 11/26/2022   LDLCALC 67 11/26/2022   TRIG 52 11/26/2022   CHOLHDL 2.1 10/11/2022    Medications Reviewed Today     Reviewed by Lionell Jon DEL, RPH (Pharmacist) on 08/31/24 at 1016  Med List Status: <None>   Medication Order Taking? Sig Documenting Provider Last Dose Status Informant  acetaminophen  (TYLENOL ) 650 MG CR tablet [id number]  Take 650 mg by mouth every 8 (eight) hours as needed for pain. [provider]  Active   AMBULATORY Natasha [name] MEDICATION [id number]  Motorized Scooter based on insurance coverage. Early, Sara E, NP  Active   diclofenac  Sodium (VOLTAREN ) 1 % GEL [id number]  APPLY 2 GRAMS TO AFFECTED AREA 4 TIMES A DAY Early, Camie BRAVO, NP  Active   Elastic Bandages & Supports (MEDICAL COMPRESSION STOCKINGS) MISC [id number]  Thigh high compression stockings for venous insufficiency and lymphedema. Please measure ankle, calf, and thigh circumference and leg length for fit. Brand per insurance. Requesting one pair. Early, Sara E, NP  Active   Elastic Bandages & Supports (MEDICAL COMPRESSION STOCKINGS) MISC [id number]  30-40 mmHg knee length compression stockings for venous insufficiency and lymphedema. Please measure ankle and calf circumference and leg length for fit. Brand per insurance coverage. Requesting 3 pair. Early, Sara E, NP  Active   fluconazole  (DIFLUCAN ) 150 MG tablet [id number]  Take 1 tablet by mouth today, 1 tablet in 3 days and the last tablet in 6 days. Early, Sara E, NP  Active   furosemide  (LASIX ) 20 MG tablet [id number]  Take 1 tablet (20 mg total) by mouth 2 (two) times daily. Early,  Sara E, NP  Active   levothyroxine  (SYNTHROID ) 125 MCG tablet [id number]  Take 1 tablet (125 mcg total) by mouth daily. Early, Sara E, NP  Active   magnesium (MAGTAB) 84 MG ( ) TBCR SR tablet [id number]  Take 72 mg by mouth. [provider]  Active   Multiple Vitamin (MULTIVITAMIN WITH MINERALS) TABS tablet [id number]  Take 1 tablet by mouth daily.  [provider]  Active Self  OVER THE COUNTER MEDICATION [id number]  Take by mouth daily. Omega XL  Patient is taking 2 gel caps daily [provider]  Active            Med Note Natasha [name]   Mon Jun 07, 2024 11:55 AM) Urolithin a 2000 mg  OVER THE COUNTER MEDICATION [id number]  Take by mouth daily. Osteo Bi Flex patient is taking 2 tablets daily [provider]  Active   potassium chloride  (KLOR-CON ) 10 MEQ tablet [id number]  Take 1 tablet (10 mEq total) by mouth 2 (two) times daily. Tysinger, Alm RAMAN, PA-C  Active   rivaroxaban  (XARELTO ) 20 MG TABS tablet [id number]  Take 1 tablet (20 mg total) by mouth daily with supper. Early, Sara E, NP  Active   tirzepatide  (ZEPBOUND ) 2.5 MG/0.5ML Pen [id number]  Inject 2.5 mg into the skin once a week. After 4 weeks, increase to 5mg .  Patient not taking: Reported on 08/12/2024   Oris Camie BRAVO, NP  Active   valsartan  (DIOVAN ) 160 MG tablet [id number]  Take 1 tablet (160 mg total) by mouth daily. Early, Sara E, NP  Active   Vitamin [name] , Ergocalciferol , (DRISDOL ) 1.25 MG (50000 UNIT) CAPS capsule [id number]  Take 1 capsule (50,000 Units total) by mouth every 7 (seven) days. Oris Camie BRAVO, NP  Active               Assessment/Plan:   Hypertension: - Currently uncontrolled - Reviewed long term cardiovascular and renal outcomes of uncontrolled blood pressure - Reviewed appropriate blood pressure monitoring technique and reviewed goal blood pressure. Recommended to check home blood pressure and heart rate daily and keep a log -Continue valsartan  320mg  (has been taking 2 of the 160mg )     Follow Up Plan: 4 weeks (has in office visit in 2 weeks)  Jon VEAR Lindau, PharmD Clinical Pharmacist [phone]  "

## 2024-09-01 ENCOUNTER — Ambulatory Visit: Admitting: Occupational Therapy

## 2024-09-01 ENCOUNTER — Ambulatory Visit: Admitting: Physical Therapy

## 2024-09-01 MED ORDER — VALSARTAN 320 MG PO TABS: 320.0000 mg | ORAL_TABLET | Freq: Every day | ORAL | 0 refills | Status: AC

## 2024-09-01 NOTE — Addendum Note (Signed)
 Addended by: LIONELL JON DEL on: 09/01/2024 01:46 PM   Modules accepted: Orders

## 2024-09-06 ENCOUNTER — Ambulatory Visit: Admitting: Physical Therapy

## 2024-09-06 ENCOUNTER — Ambulatory Visit: Admitting: Occupational Therapy

## 2024-09-07 ENCOUNTER — Ambulatory Visit (HOSPITAL_BASED_OUTPATIENT_CLINIC_OR_DEPARTMENT_OTHER): Admitting: Cardiology

## 2024-09-07 ENCOUNTER — Encounter (HOSPITAL_BASED_OUTPATIENT_CLINIC_OR_DEPARTMENT_OTHER): Payer: Self-pay | Admitting: Cardiology

## 2024-09-07 VITALS — BP 138/86 | HR 70 | Ht 67.0 in | Wt 313.0 lb

## 2024-09-07 DIAGNOSIS — R600 Localized edema: Secondary | ICD-10-CM | POA: Diagnosis not present

## 2024-09-07 DIAGNOSIS — R0609 Other forms of dyspnea: Secondary | ICD-10-CM

## 2024-09-07 DIAGNOSIS — R0789 Other chest pain: Secondary | ICD-10-CM | POA: Diagnosis not present

## 2024-09-07 DIAGNOSIS — I10 Essential (primary) hypertension: Secondary | ICD-10-CM

## 2024-09-07 MED ORDER — SPIRONOLACTONE 25 MG PO TABS: 25.0000 mg | ORAL_TABLET | Freq: Every day | ORAL | 3 refills | Status: AC

## 2024-09-07 NOTE — Patient Instructions (Addendum)
 STOP potassium supplement START spironolactone  25 mg daily  We will recheck kidney function and potassium in 2 weeks  .Medication Instructions:  Your physician has recommended you make the following change in your medication:  1.) stop potassium 2.) start spironolactone  25 mg - one tablet daily  *If you need a refill on your cardiac medications before your next appointment, please call your pharmacy*  Lab Work: In 2 weeks - return for blood work --basic metabolic panel  Testing/Procedures: none  Follow-Up: At [facility], you and your health needs are our priority.  As part of our continuing mission to provide you with exceptional heart care, our providers are all part of one team.  This team includes your primary Cardiologist (physician) and Advanced Practice Providers or APPs (Physician Assistants and Nurse Practitioners) who all work together to provide you with the care you need, when you need it.  Your next appointment:   2 month(s)  Provider:   Shelda Bruckner, MD, Rosaline Bane, NP, or Reche Finder, NP

## 2024-09-07 NOTE — Progress Notes (Signed)
 " Cardiology Office Note:  .   Date:  09/07/2024  ID:  Natasha [name], DOB 09-Nov-1950, MRN [id number] PCP: Oris Camie BRAVO, NP  [facility] Providers Cardiologist:  Shelda Bruckner, MD {  History of Present Illness: Natasha [name] is a 74 y.o. female with a hx of Covid-19 infection in 08/2019, pulmonary embolism, hypertension, hyperthyroidism who is seen for follow up. I initially saw her 11/17/19 as a new consult at the request of Benjamine Aland, MD for the evaluation and management of post-Covid/post-PE management and bradycardia.   History: She required hospitalization for Covid pneumonia from 08/21/19-09/06/19. She received steroids and remdesivir . During her admission, she was diagnosed with submassive PE and started on rivaroxaban . She was recommended for cardiology post discharge follow up for bradycardia and monitoring of RV function.  Today: Has been struggling with lymphedema, had Unna boots for 6 mos, now able to wear compression stockings. Has struggled with knee pain, injections did not help.   Blood pressure has been fluctuating. Running high most of the time. Takes furosemide  BID, valsartan  160 mg BID.   Has been very fatigued in the last month, has felt pins and needles in her face intermittently. Had focal chest pain last night. Reviewed her reassuring stress test from a year ago. Felt like someone was pushing on her chest while she was sitting on the side of the bed. Lasted about 10-15 minutes. Improved with standing up and moving around, which helped. She did eat late last night.  Feels tired all the time. Awaiting new CPAP machine.   Studies Reviewed: SABRA    EKG:  EKG Interpretation Date/Time:  Tuesday September 07 2024 14:18:46 EST Ventricular Rate:  70 PR Interval:  210 QRS Duration:  104 QT Interval:  410 QTC Calculation: 442 R Axis:   -29  Text Interpretation: Sinus rhythm with 1st degree A-V block Moderate voltage criteria for LVH, may be normal variant  Cannot rule out Anterior infarct , age undetermined When compared with ECG of 18-Jun-2023 10:33, No significant change was found Confirmed by Bruckner Shelda [phone]) on 09/07/2024 2:30:56 PM    Physical Exam:   VS:  BP 138/86   Pulse 70   Ht 5' 7 (1.702 m)   Wt (!) 313 lb (142 kg)   SpO2 91%   BMI 49.02 kg/m    Wt Readings from Last 3 Encounters:  09/07/24 (!) 313 lb (142 kg)  07/26/24 (!) 322 lb 9.6 oz (146.3 kg)  07/07/24 (!) 315 lb (142.9 kg)    GEN: Well nourished, well developed in no acute distress HEENT: Normal, moist mucous membranes NECK: No JVD CARDIAC: regular rhythm, normal S1 and S2, no rubs or gallops. No murmur. VASCULAR: Radial and DP pulses 2+ bilaterally. No carotid bruits RESPIRATORY:  Clear to auscultation without rales, wheezing or rhonchi  ABDOMEN: Soft, non-tender, non-distended MUSCULOSKELETAL:  Ambulates independently with rollator SKIN: Warm and dry, trivial firm bilateral LE edema under compression stockings NEUROLOGIC:  Alert and oriented x 3. No focal neuro deficits noted. PSYCHIATRIC:  Normal affect    ASSESSMENT AND PLAN: .    LE edema, chronic History of Covid pneumonia and pulmonary embolism -echo: no evidence of right or left sided heart failure -counseled on need to continue using CPAP for OSA -counseled on daily weights, salt avoidance -counseled on compression stockings and leg elevation. With normal right atrial pressure, supports venous insufficiency/lymphedema -remains on rivaroxaban , likely lifelong   Hypertension: -continue furosemide , currently taking BID -  amlodipine  stopped since last visit (was on 5 mg dose) -continue valsartan  160 mg BID -will add spironolactone  25 mg daily, stop potassium supplement, recheck BMET in 2 weeks  Chest pressure, brief, nonexertional Dyspnea on exertion, chronic -cardiac PET reassuring 09/2023 -reviewed red flag warning signs that need immediate medical attention   Morbid obesity, BMI  49 -on zepbound   CV risk counseling and prevention -recommend heart healthy/Mediterranean diet, with whole grains, fruits, vegetable, fish, lean meats, nuts, and olive oil. Limit salt. -recommend moderate walking, 3-5 times/week for 30-50 minutes each session. Aim for at least 150 minutes.week. Goal should be pace of 3 miles/hours, or walking 1.5 miles in 30 minutes -recommend avoidance of tobacco products. Avoid excess alcohol. -ASCVD risk score: The 10-year ASCVD risk score (Arnett DK, et al., 2019) is: 14.4%   Values used to calculate the score:     Age: 70 years     Clinically relevant sex: Female     Is Non-Hispanic African American: Yes     Diabetic: No     Tobacco smoker: No     Systolic Blood Pressure: 138 mmHg     Is BP treated: Yes     HDL Cholesterol: 83 mg/dL     Total Cholesterol: 161 mg/dL    Dispo: 2 mos   Signed, Shelda Bruckner, MD   Shelda Bruckner, MD, PhD, [facility]  [facility]  [place]  Heart & Vascular at [facility] at [facility] [address] [place], [place] [address] [phone]   "

## 2024-09-08 ENCOUNTER — Ambulatory Visit: Admitting: Physical Therapy

## 2024-09-08 ENCOUNTER — Ambulatory Visit: Admitting: Occupational Therapy

## 2024-09-10 ENCOUNTER — Other Ambulatory Visit: Payer: Self-pay

## 2024-09-10 DIAGNOSIS — E039 Hypothyroidism, unspecified: Secondary | ICD-10-CM | POA: Diagnosis not present

## 2024-09-10 DIAGNOSIS — G4733 Obstructive sleep apnea (adult) (pediatric): Secondary | ICD-10-CM | POA: Diagnosis not present

## 2024-09-10 DIAGNOSIS — I50810 Right heart failure, unspecified: Secondary | ICD-10-CM | POA: Diagnosis not present

## 2024-09-10 DIAGNOSIS — I890 Lymphedema, not elsewhere classified: Secondary | ICD-10-CM | POA: Diagnosis not present

## 2024-09-10 DIAGNOSIS — D519 Vitamin B12 deficiency anemia, unspecified: Secondary | ICD-10-CM | POA: Diagnosis not present

## 2024-09-10 DIAGNOSIS — M1712 Unilateral primary osteoarthritis, left knee: Secondary | ICD-10-CM | POA: Diagnosis not present

## 2024-09-10 DIAGNOSIS — E119 Type 2 diabetes mellitus without complications: Secondary | ICD-10-CM | POA: Diagnosis not present

## 2024-09-10 DIAGNOSIS — K219 Gastro-esophageal reflux disease without esophagitis: Secondary | ICD-10-CM | POA: Diagnosis not present

## 2024-09-10 DIAGNOSIS — I110 Hypertensive heart disease with heart failure: Secondary | ICD-10-CM | POA: Diagnosis not present

## 2024-09-10 DIAGNOSIS — E785 Hyperlipidemia, unspecified: Secondary | ICD-10-CM | POA: Diagnosis not present

## 2024-09-10 DIAGNOSIS — H6641 Suppurative otitis media, unspecified, right ear: Secondary | ICD-10-CM | POA: Diagnosis not present

## 2024-09-10 DIAGNOSIS — M1711 Unilateral primary osteoarthritis, right knee: Secondary | ICD-10-CM | POA: Diagnosis not present

## 2024-09-10 NOTE — Patient Instructions (Signed)
 Visit Information  Thank you for taking time to visit with me today. Please don't hesitate to contact me if I can be of assistance to you before our next scheduled appointment.  Your next care management appointment is by telephone on Thursday, January 15 at 12:00 PM  Please call the care guide team at [phone] if you need to cancel, schedule, or reschedule an appointment.   Please call [phone]-TALK (toll free, 24 hour hotline) if you are experiencing a Mental Health or Behavioral Health Crisis or need someone to talk to.  Clayborne Ly RN BSN CCM [place]  [facility], [facility] Nurse Care Coordinator  Direct Dial: [phone] Website: [email or web address]

## 2024-09-13 ENCOUNTER — Ambulatory Visit: Admitting: Medical

## 2024-09-13 ENCOUNTER — Encounter: Payer: Self-pay | Admitting: Medical

## 2024-09-13 ENCOUNTER — Ambulatory Visit: Admitting: Physical Therapy

## 2024-09-13 ENCOUNTER — Ambulatory Visit: Admitting: Occupational Therapy

## 2024-09-13 VITALS — BP 136/84 | HR 60 | Wt 309.8 lb

## 2024-09-13 DIAGNOSIS — R32 Unspecified urinary incontinence: Secondary | ICD-10-CM | POA: Diagnosis not present

## 2024-09-13 DIAGNOSIS — I890 Lymphedema, not elsewhere classified: Secondary | ICD-10-CM

## 2024-09-13 DIAGNOSIS — R7303 Prediabetes: Secondary | ICD-10-CM | POA: Diagnosis not present

## 2024-09-13 DIAGNOSIS — I10 Essential (primary) hypertension: Secondary | ICD-10-CM | POA: Diagnosis not present

## 2024-09-13 DIAGNOSIS — N390 Urinary tract infection, site not specified: Secondary | ICD-10-CM | POA: Diagnosis not present

## 2024-09-13 DIAGNOSIS — Z7409 Other reduced mobility: Secondary | ICD-10-CM

## 2024-09-13 DIAGNOSIS — G4733 Obstructive sleep apnea (adult) (pediatric): Secondary | ICD-10-CM

## 2024-09-13 DIAGNOSIS — H9193 Unspecified hearing loss, bilateral: Secondary | ICD-10-CM

## 2024-09-13 DIAGNOSIS — I872 Venous insufficiency (chronic) (peripheral): Secondary | ICD-10-CM

## 2024-09-13 DIAGNOSIS — E890 Postprocedural hypothyroidism: Secondary | ICD-10-CM

## 2024-09-13 DIAGNOSIS — R300 Dysuria: Secondary | ICD-10-CM

## 2024-09-13 LAB — POCT URINALYSIS DIP (CLINITEK)
Bilirubin, UA: NEGATIVE
Blood, UA: NEGATIVE
Glucose, UA: NEGATIVE mg/dL
Ketones, POC UA: NEGATIVE mg/dL
Leukocytes, UA: NEGATIVE
Nitrite, UA: POSITIVE — AB
POC PROTEIN,UA: NEGATIVE
Spec Grav, UA: 1.01
Urobilinogen, UA: 0.2 U/dL
pH, UA: 6

## 2024-09-13 NOTE — Progress Notes (Signed)
 "  Name: Natasha [name]   Date of Visit: 09/13/2024   Date of last visit with me: 05/27/2024   CHIEF COMPLAINT:  Chief Complaint  Patient presents with   other    BP has been elevated checking about 5-6 times daily, been all over the place up and down, discuss lymphedema, scheduled for a hearing test and cognitive test, disability tag, bladder issues, 8 diapers a day medicine not helping is there a procedure that can be done and frequent UTI, pin and needles feeling in face,        HPI:  Discussed the use of AI scribe software for clinical note transcription with the patient, who gave verbal consent to proceed.  History of Present Illness   Patient Care Team: Early, Camie BRAVO, NP as PCP - General (Nurse Practitioner) Lonni Slain, MD as PCP - Cardiology (Cardiology) Pa, [facility], Clayborne CROME, RN as [facility], Jon DEL, [facility] (Pharmacist)    Natasha [name] is a 74 year old female with hypertension and lymphedema who presents with concerns about elevated blood pressure, lymphedema, and bladder issues and other issues.   Accompanied by her daughter Marley from [place].  She has been experiencing elevated blood pressure readings at home, with values reaching as high as 176/93, which differ from the office reading of 136/84. Her cardiologist recently adjusted her medication regimen, continuing furosemide  twice daily and valsartan  160 mg twice daily, while discontinuing amlodipine . Spironolactone  25 mg daily was added, and she stopped potassium supplements. Her physical therapist has also noted elevated readings and has advised against exercise when her blood pressure is high.  She has a history of lymphedema and uses compression stockings and braces. She underwent six months of wraps, which initially reduced swelling but eventually plateaued. She has pumps at home but is unsure about the optimal frequency of use. Her daughter assists with  wrapping her legs, and she has braces for both legs.  She has a history of frequent urinary tract infections and bladder issues, using eight to nine heavy-duty diapers daily due to incontinence. She has been on tropsium for overactive bladder but is concerned about its interaction with her diuretics. A recent urine test showed positive nitrates, and she is awaiting culture results. She has not seen a urologist recently but recalls a past visit to a gynecologist/urologist in [place].  She reports a history of congestive heart failure and recalls being told about heart enlargement in the past. She remains on Xarelto  lifelong and is suppose to be using CPAP, although she is currently experiencing issues with obtaining the machine from [employer].  She is considering weight loss medication, Zepbound , but is hesitant.   She has hx/o treated non medullary thyroid  cancer.  No other aggravating or relieving factors. No other complaint.   Past Medical History:  Diagnosis Date   Abdominal spasms 12/14/2020   Acute pain of right shoulder 01/31/2023   Acute pulmonary embolism (HCC) 08/21/2019   Bacterial conjunctivitis of both eyes 12/14/2020   Bilateral leg edema 02/10/2020   Bradycardia    Breast cancer screening by mammogram 11/24/2020   Candida infection 10/11/2022   CARCINOMA, THYROID  GLAND, PAPILLARY 05/04/2008   Stage 2, 8/09: thyroidectomy for 2.7cm papillary adenocarcinoma (t2 n0 mo) 9/09: I-131 rx, 108 mci 05/10: tg is neg (ab neg) , total body scan is neg   Chronic right-sided low back pain with right-sided sciatica 11/24/2020   Colon cancer screening 11/24/2020   Colon polyps  Complication of anesthesia    trouble with airway   Cough 12/25/2007   Qualifier: Diagnosis of  By: Germaine LPN, Megan     RNCPI-80 08/19/2019   Decreased ROM of neck 01/31/2023   Diverticulosis    DVT (deep venous thrombosis) (HCC)    Edema 12/22/2008   Encounter to establish care 11/24/2020   Groin  pain, chronic, right 11/24/2020   Health care maintenance 11/26/2022   Hip pain 12/22/2020   History of 2019 novel coronavirus disease (COVID-19) 11/09/2019   HYPERTENSION 12/25/2007   HYPOTHYROIDISM, POSTSURGICAL 05/04/2008   LEG PAIN, LEFT 12/09/2008   Low TSH level 12/10/2022   Lumbago with sciatica, right side 01/08/2021   Memory loss due to medical condition 11/09/2019   Muscle weakness    Nausea and vomiting 05/23/2008   Qualifier: Diagnosis of  By: Kassie MD, Alyce DELENA Deal of this note might be different from the original. Qualifier: Diagnosis of  By: Kassie MD, Sean A   Neck pain 01/31/2023   Non-recurrent acute suppurative otitis media of left ear without spontaneous rupture of tympanic membrane 02/17/2023   Numbness and tingling    Obesity, morbid, BMI 50 or higher (HCC) 08/24/2019   OSA (obstructive sleep apnea) 06/15/2013   CPAP   Other fatigue 11/26/2022   Other skin changes 06/16/2024   Paresthesia 01/04/2020   Peripheral edema 12/22/2008   Qualifier: Diagnosis of  By: Delford, MD, CODY Maude Dunnings    Pneumonia due to COVID-19 virus    Pulmonary embolism (HCC)    Pulmonary embolism (HCC) 08/23/2019   Recurrent urinary tract infection 01/31/2023   03/04/2024- consider prophylactic treatment if more than 6 per year.      Right lower quadrant pain 11/24/2020   Sciatica of left side 12/04/2011   Skin sensation disturbance 12/05/2008   Qualifier: Diagnosis of  By: Inocencio MD, Berwyn DELENA Deal of this note might be different from the original. Qualifier: Diagnosis of  By: Inocencio MD, Berwyn DELENA   Snoring 12/10/2022   Spasm of muscle of lower back 12/14/2020   Upper back pain 01/31/2023   Vaginal candidiasis 03/16/2021   Weakness 01/04/2020   Medications Ordered Prior to Encounter[1]  ROS as in subjective   Objective: BP 136/84 Comment: BP on machine 176/93  Pulse 60   Wt (!) 309 lb 12.8 oz (140.5 kg)   BMI 48.52 kg/m   General appearence:  alert, no distress, WD/WN,  Psych: pleasant, answers questions appropriately She has compression hose on both legs    Assessment: Encounter Diagnoses  Name Primary?   Essential hypertension Yes   Incontinence of urine in female    Prediabetes    OSA (obstructive sleep apnea)    Lymphedema    Mobility impaired    Chronic venous insufficiency    Hypothyroidism, postsurgical    Dysuria    Recurrent UTI    Decreased hearing of both ears      Plan  Essential hypertension Blood pressure fluctuates; home readings higher than office. Recent medication adjustments include spironolactone  addition last week by cardiology and potassium supplement discontinuation. Current regimen: valsartan , furosemide , spironolactone . - will email cardiologist to clarify valsartan  dosing (160 mg twice daily vs. 320 mg once daily). - Recheck electrolytes in two weeks. - Monitor blood pressure at home and compare with office readings. - Encouraged exercise if blood pressure is under 140/90. -home cuff likely not accurate as we had a large discrepancy today with her cuff reading much higher  Lymphedema Chronic condition managed with compression and exercise. Previous treatments include compression stockings, braces, pneumatic pumps, and wraps with no significant improvement. - Use pneumatic pumps once or twice daily, preferably in the afternoon or evening. - Continue use of compression stockings and braces. - Consider vascular surgery consultation if interested in exploring other management  Urinary incontinence with recurrent urinary tract infections Recurrent UTIs with positive nitrates. Frequent use of adult diapers. Previous urology consultation borderline for procedural intervention. Current management includes trospium . - Sent urine for culture. - she will call to reschedule with gynecology/urology for further evaluation and potential procedural intervention.  Obstructive sleep apnea Difficulty  obtaining CPAP machine due to [employer] issues. CPAP is crucial for management. - we will contact [employer] to resolve CPAP machine delivery issues. -apparently she isn't answering her phone with [employer] calls  Chronic venous insufficiency Managed with compression stockings and lifestyle modifications. - Continue use of compression stockings. - Consider vascular surgery consultation if interested in exploring new treatments.  Obesity Weight loss could benefit joint pain, bladder issues, and heart health. Concerns about long-term weight loss medication use due to thyroid  cancer history. Discussed side effects and insurance coverage. - Encouraged trial of Zepbound  for weight loss, starting with low dose and gradually increasing. - Instructed to monitor for side effects such as nausea, indigestion, and constipation. - Discussed potential for long-term use and lifestyle changes to maintain weight loss. -she already has the starter dose of Zepbound , just hasn't initiated it  Mobility impairment Exercise recommended for management. - Encouraged regular exercise as tolerated.  General health maintenance Baseline hearing screen needed. - baseline hearing screen abnormal, offered referral to audiology  Completed handicap placard application again, suspect she is losing the former ones   Maryssa was seen today for other.  Diagnoses and all orders for this visit:  Essential hypertension  Incontinence of urine in female  Prediabetes  OSA (obstructive sleep apnea)  Lymphedema  Mobility impaired  Chronic venous insufficiency  Hypothyroidism, postsurgical  Dysuria -     Urine Culture  Recurrent UTI -     POCT URINALYSIS DIP (CLINITEK)  Decreased hearing of both ears    F/u pending call back          [1]  Current Outpatient Medications on File Prior to Visit  Medication Sig Dispense Refill   acetaminophen  (TYLENOL ) 650 MG CR tablet Take 650 mg by mouth every 8 (eight)  hours as needed for pain.     AMBULATORY NON FORMULARY MEDICATION Motorized Scooter based on insurance coverage. 1 each 0   Cholecalciferol 50 MCG (2000 UT) CAPS Take by mouth once a week. 2400 units     diclofenac  Sodium (VOLTAREN ) 1 % GEL APPLY 2 GRAMS TO AFFECTED AREA 4 TIMES A DAY 300 g 1   Elastic Bandages & Supports (MEDICAL COMPRESSION STOCKINGS) MISC Thigh high compression stockings for venous insufficiency and lymphedema. Please measure ankle, calf, and thigh circumference and leg length for fit. Brand per insurance. Requesting one pair. 2 each 2   Elastic Bandages & Supports (MEDICAL COMPRESSION STOCKINGS) MISC 30-40 mmHg knee length compression stockings for venous insufficiency and lymphedema. Please measure ankle and calf circumference and leg length for fit. Brand per insurance coverage. Requesting 3 pair. 6 each 2   furosemide  (LASIX ) 20 MG tablet Take 1 tablet (20 mg total) by mouth 2 (two) times daily. 180 tablet 0   levothyroxine  (SYNTHROID ) 125 MCG tablet Take 1 tablet (125 mcg total) by mouth daily. 90 tablet 1  magnesium (MAGTAB) 84 MG ( ) TBCR SR tablet Take 72 mg by mouth.     Multiple Vitamin (MULTIVITAMIN WITH MINERALS) TABS tablet Take 1 tablet by mouth daily.     NONFORMULARY OR COMPOUNDED ITEM Take 2 drops by mouth at bedtime. Soursop, moringa, seamoss, oregano, black seed oil     OVER THE COUNTER MEDICATION Take by mouth daily. Omega XL  Patient is taking 2 gel caps daily     OVER THE COUNTER MEDICATION Take by mouth daily. Osteo Bi Flex patient is taking 2 tablets daily     OVER THE COUNTER MEDICATION Take by mouth every other day. Lymph MD     OVER THE COUNTER MEDICATION Take by mouth once a week. Thyro Support     OVER THE COUNTER MEDICATION Take by mouth once a week. Ultra Collagen Complex     rivaroxaban  (XARELTO ) 20 MG TABS tablet Take 1 tablet (20 mg total) by mouth daily with supper. 90 tablet 3   spironolactone  (ALDACTONE ) 25 MG tablet Take 1 tablet (25 mg  total) by mouth daily. 90 tablet 3   valsartan  (DIOVAN ) 320 MG tablet Take 1 tablet (320 mg total) by mouth daily. 90 tablet 0   Vitamin D , Ergocalciferol , (DRISDOL ) 1.25 MG (50000 UNIT) CAPS capsule Take 1 capsule (50,000 Units total) by mouth every 7 (seven) days. 12 capsule 3   Vitamin D -Vitamin K (VITAMIN K2-VITAMIN D3 PO) Take by mouth daily. 10000 Units and 200mcg     fluconazole  (DIFLUCAN ) 150 MG tablet Take 1 tablet by mouth today, 1 tablet in 3 days and the last tablet in 6 days. 2 tablet 0   tirzepatide  (ZEPBOUND ) 2.5 MG/0.5ML Pen Inject 2.5 mg into the skin once a week. After 4 weeks, increase to 5mg . (Patient not taking: Reported on 09/13/2024) 2 mL 0   No current facility-administered medications on file prior to visit.   "

## 2024-09-14 NOTE — Patient Outreach (Signed)
 Complex Care Management   Visit Note  09/14/2024  Name:  Natasha [name] MRN: [id number] DOB: Nov 28, 1950  Situation: Referral received for Complex Care Management related to Right-Sided Hearted Failure, Chronic Venous Insufficiency, Pelvic Floor dysfunction, Overactive Bladder, recurrent UTI, Obesity, Class III, BMI 40-49.9 (morbid obesity), history of Pulmonary Embolism, Bilateral lymphedema to LE, OSA, Weakness of both lower extremities, Arthritis to both knees. I obtained verbal consent from Patient.  Visit completed with Patient on the phone.  Background:   Past Medical History:  Diagnosis Date   Abdominal spasms 12/14/2020   Acute pain of right shoulder 01/31/2023   Acute pulmonary embolism (HCC) 08/21/2019   Bacterial conjunctivitis of both eyes 12/14/2020   Bilateral leg edema 02/10/2020   Bradycardia    Breast cancer screening by mammogram 11/24/2020   Candida infection 10/11/2022   CARCINOMA, THYROID  GLAND, PAPILLARY 05/04/2008   Stage 2, 8/09: thyroidectomy for 2.7cm papillary adenocarcinoma (t2 n0 mo) 9/09: I-131 rx, 108 [facility]ci 05/10: tg is neg (ab neg) , total body scan is neg   Chronic right-sided low back pain with right-sided sciatica 11/24/2020   Colon cancer screening 11/24/2020   Colon polyps    Complication of anesthesia    trouble with airway   Cough 12/25/2007   Qualifier: Diagnosis of  By: Germaine LPN, Megan     RNCPI-80 08/19/2019   Decreased ROM of neck 01/31/2023   Diverticulosis    DVT (deep venous thrombosis) (HCC)    Edema 12/22/2008   Encounter to establish care 11/24/2020   Groin pain, chronic, right 11/24/2020   Health care maintenance 11/26/2022   Hip pain 12/22/2020   History of 2019 novel coronavirus disease (COVID-19) 11/09/2019   HYPERTENSION 12/25/2007   HYPOTHYROIDISM, POSTSURGICAL 05/04/2008   LEG PAIN, LEFT 12/09/2008   Low TSH level 12/10/2022   Lumbago with sciatica, right side 01/08/2021   Memory loss due to medical condition  11/09/2019   Muscle weakness    Nausea and vomiting 05/23/2008   Qualifier: Diagnosis of  By: Kassie MD, Alyce LABOR   Formatting of this note might be different from the original. Qualifier: Diagnosis of  By: Kassie MD, Sean A   Neck pain 01/31/2023   Non-recurrent acute suppurative otitis media of left ear without spontaneous rupture of tympanic membrane 02/17/2023   Numbness and tingling    Obesity, morbid, BMI 50 or higher (HCC) 08/24/2019   OSA (obstructive sleep apnea) 06/15/2013   CPAP   Other fatigue 11/26/2022   Other skin changes 06/16/2024   Paresthesia 01/04/2020   Peripheral edema 12/22/2008   Qualifier: Diagnosis of  By: Delford, MD, CODY Maude Dunnings    Pneumonia due to COVID-19 virus    Pulmonary embolism (HCC)    Pulmonary embolism (HCC) 08/23/2019   Recurrent urinary tract infection 01/31/2023   03/04/2024- consider prophylactic treatment if more than 6 per year.      Right lower quadrant pain 11/24/2020   Sciatica of left side 12/04/2011   Skin sensation disturbance 12/05/2008   Qualifier: Diagnosis of  By: Inocencio MD, Berwyn LABOR Deal of this note might be different from the original. Qualifier: Diagnosis of  By: Inocencio MD, Berwyn LABOR   Snoring 12/10/2022   Spasm of muscle of lower back 12/14/2020   Upper back pain 01/31/2023   Vaginal candidiasis 03/16/2021   Weakness 01/04/2020    Assessment: Patient Reported Symptoms:  Cognitive Cognitive Status: Alert and oriented to person, place, and time, Normal speech and language skills Cognitive/Intellectual  Conditions Management [RPT]: None reported or documented in medical history or problem list   Health Maintenance Behaviors: Annual physical exam Health Facilitated by: Rest  Neurological Neurological Review of Symptoms: Numbness Neurological Management Strategies: Adequate rest, Medication therapy, Routine screening Neurological Self-Management Outcome: 3 (uncertain) Neurological Comment: numbness to  both feet/toes  HEENT HEENT Symptoms Reported: Sudden change or loss of vision HEENT Management Strategies: Routine screening HEENT Self-Management Outcome: 3 (uncertain) HEENT Comment: patient reports she needs an eye exam    Cardiovascular Cardiovascular Symptoms Reported: Swelling in legs or feet Does patient have uncontrolled Hypertension?: Yes Is patient checking Blood Pressure at home?: Yes Patient's Recent BP reading at home: BP 170's/80-90's Cardiovascular Management Strategies: Routine screening, Adequate rest, Medication therapy Cardiovascular Self-Management Outcome: 2 (bad) Cardiovascular Comment: bilateral lymphedema to both legs, elevated BP  Respiratory Respiratory Symptoms Reported: Other: Other Respiratory Symptoms: Alteration in sleep pattern; Educated patient re: CPAP supplies, discussed patient has the contact number for DME supplier and will call today Respiratory Management Strategies: CPAP, Routine screening Respiratory Self-Management Outcome: 3 (uncertain)  Endocrine Endocrine Symptoms Reported: Blurry vision Is patient diabetic?: Yes Is patient checking blood sugars at home?: No Endocrine Self-Management Outcome: 3 (uncertain)  Gastrointestinal Gastrointestinal Symptoms Reported: Constipation Gastrointestinal Management Strategies: Medication therapy, Diet modification Gastrointestinal Self-Management Outcome: 4 (good)    Genitourinary Genitourinary Symptoms Reported: Frequency, Incontinence, Urgency Genitourinary Management Strategies: Incontinence garment/pad, Fluid modification Genitourinary Self-Management Outcome: 2 (bad)  Integumentary Integumentary Symptoms Reported: Wound, Skin changes Other Integumentary Symptoms: lymphedema to bilateral legs, keeping legs wrapped, lymphedema pumps Skin Management Strategies: Routine screening, Dressing changes Skin Self-Management Outcome: 4 (good)  Musculoskeletal Musculoskelatal Symptoms Reviewed: Weakness,  Difficulty walking, Unsteady gait, Limited mobility Musculoskeletal Management Strategies: Adequate rest, Routine screening, Medical device Musculoskeletal Self-Management Outcome: 3 (uncertain) Falls in the past year?: No Number of falls in past year: 1 or less Was there an injury with Fall?: No Fall Risk Category Calculator: 0 Patient Fall Risk Level: Low Fall Risk Patient at Risk for Falls Due to: Impaired balance/gait, Impaired mobility Fall risk Follow up: Falls evaluation completed, Education provided, Falls prevention discussed  Psychosocial Psychosocial Symptoms Reported: Alteration in sleep habits Behavioral Management Strategies: Adequate rest (medical device; routine screening) Behavioral Health Self-Management Outcome: 3 (uncertain) Major Change/Loss/Stressor/Fears (CP): Medical condition, self Techniques to Cope with Loss/Stress/Change: Diversional activities Quality of Family Relationships: helpful, involved, supportive Do you feel physically threatened by others?: No    09/14/2024    PHQ2-9 Depression Screening   Aretta Stetzel interest or pleasure in doing things    Feeling down, depressed, or hopeless    PHQ-2 - Total Score    Trouble falling or staying asleep, or sleeping too much    Feeling tired or having Shalonda Sachse energy    Poor appetite or overeating     Feeling bad about yourself - or that you are a failure or have let yourself or your family down    Trouble concentrating on things, such as reading the newspaper or watching television    Moving or speaking so slowly that other people could have noticed.  Or the opposite - being so fidgety or restless that you have been moving around a lot more than usual    Thoughts that you would be better off dead, or hurting yourself in some way    PHQ2-9 Total Score    If you checked off any problems, how difficult have these problems made it for you to do your work, take care of things at home,  or get along with other people     Depression Interventions/Treatment      There were no vitals filed for this visit. Pain Scale: Not given for pain  Medications Reviewed Today     Reviewed by Morgan Clayborne CROME, RN (Registered Nurse) on 09/10/24 at 1051  Med List Status: <None>   Medication Order Taking? Sig Documenting Provider Last Dose Status Informant  acetaminophen  (TYLENOL ) 650 MG CR tablet [id number] Yes Take 650 mg by mouth every 8 (eight) hours as needed for pain. [provider]  Active   SONJIA JESSE SCHLOSSMAN MEDICATION [id number] Yes Motorized Scooter based on insurance coverage. Early, Sara E, NP  Active   Cholecalciferol 50 MCG (2000 UT) CAPS [id number]  Take by mouth once a week. 2400 units [provider]  Active   diclofenac  Sodium (VOLTAREN ) 1 % GEL [id number] Yes APPLY 2 GRAMS TO AFFECTED AREA 4 TIMES A DAY Early, Camie BRAVO, NP  Active   Elastic Bandages & Supports (MEDICAL COMPRESSION STOCKINGS) MISC [id number] Yes Thigh high compression stockings for venous insufficiency and lymphedema. Please measure ankle, calf, and thigh circumference and leg length for fit. Brand per insurance. Requesting one pair. Early, Sara E, NP  Active   Elastic Bandages & Supports (MEDICAL COMPRESSION STOCKINGS) MISC [id number] Yes 30-40 mmHg knee length compression stockings for venous insufficiency and lymphedema. Please measure ankle and calf circumference and leg length for fit. Brand per insurance coverage. Requesting 3 pair. Early, Sara E, NP  Active   fluconazole  (DIFLUCAN ) 150 MG tablet [id number] Yes Take 1 tablet by mouth today, 1 tablet in 3 days and the last tablet in 6 days. Early, Sara E, NP  Active   furosemide  (LASIX ) 20 MG tablet [id number] Yes Take 1 tablet (20 mg total) by mouth 2 (two) times daily. Early, Sara E, NP  Active   levothyroxine  (SYNTHROID ) 125 MCG tablet [id number] Yes Take 1 tablet (125 mcg total) by mouth daily. Early, Sara E, NP  Active   magnesium (MAGTAB) 84 MG ( ) TBCR SR tablet  [id number] Yes Take 72 mg by mouth. [provider]  Active   Multiple Vitamin (MULTIVITAMIN WITH MINERALS) TABS tablet [id number] Yes Take 1 tablet by mouth daily. [provider]  Active Self  NONFORMULARY OR COMPOUNDED ITEM [id number]  Take 2 drops by mouth at bedtime. Soursop, moringa, seamoss, oregano, black seed oil [provider]  Active   OVER THE COUNTER MEDICATION [id number] Yes Take by mouth daily. Omega XL  Patient is taking 2 gel caps daily [provider]  Active            Med Note DELILA, ADAM D   Mon Jun 07, 2024 11:55 AM) Urolithin a 2000 mg  OVER THE COUNTER MEDICATION [id number] Yes Take by mouth daily. Osteo Bi Flex patient is taking 2 tablets daily [provider]  Active   OVER THE COUNTER MEDICATION [id number]  Take by mouth every other day. Lymph MD [provider]  Active   OVER THE COUNTER MEDICATION [id number]  Take by mouth once a week. Thyro Support [provider]  Active   OVER THE COUNTER MEDICATION [id number]  Take by mouth once a week. Ultra Collagen Complex [provider]  Active   rivaroxaban  (XARELTO ) 20 MG TABS tablet [id number] Yes Take 1 tablet (20 mg total) by mouth daily with supper. Early, Sara E, NP  Active   spironolactone  (ALDACTONE ) 25 MG tablet [id number]  Take 1 tablet (25 mg total) by mouth  daily. Lonni Slain, MD  Active   tirzepatide  (ZEPBOUND ) 2.5 MG/0.5ML Pen [id number] Yes Inject 2.5 mg into the skin once a week. After 4 weeks, increase to 5mg . Early, Sara E, NP  Active   valsartan  (DIOVAN ) 320 MG tablet [id number] Yes Take 1 tablet (320 mg total) by mouth daily. Early, Sara E, NP  Active   Vitamin D , Ergocalciferol , (DRISDOL ) 1.25 MG (50000 UNIT) CAPS capsule [id number] Yes Take 1 capsule (50,000 Units total) by mouth every 7 (seven) days. Early, Sara E, NP  Active   Vitamin D -Vitamin K (VITAMIN K2-VITAMIN D3 PO) [id number]  Take by mouth daily. 10000 Units and [provider]  Active             Recommendation:   09/30/2024 Status: Sch   Time: 10:00 AM Length: 30  Visit Type: PATIENT OUTREACH 30 [3016] Copay: $0.00  Provider: PFM-PHARMACIST Department: [facility]   PCP Follow-up  10/12/2024 Status: Sch   Time: 1:30 PM Length: 45  Visit Type: OFFICE VISIT [8002] Copay: $0.00  Provider: Oris Camie BRAVO, NP Department: [facility]   Specialty provider follow-up   11/26/2024 Status: Sch   Time: 4:00 PM Length: 20  Visit Type: OFFICE VISIT [1004] Copay: $40.00  Provider: Lonni Slain, MD Department: [facility]   Follow Up Plan:   Telephone follow up appointment date/time 09/16/2024 Status: Sch   Time: 12:00 PM Length: 30  Visit Type: VBCI TELEPHONE CALL 30 [2502] Copay: $0.00  Provider: Morgan Clayborne CROME, RN Department: [facility]    Clayborne Morgan RN BSN CCM [place]  [facility], [facility] Nurse Care Coordinator  Direct Dial: [phone] Website: [email or web address]

## 2024-09-15 ENCOUNTER — Ambulatory Visit: Admitting: Physical Therapy

## 2024-09-15 ENCOUNTER — Ambulatory Visit: Admitting: Occupational Therapy

## 2024-09-15 NOTE — Progress Notes (Signed)
 Pt was notified of results

## 2024-09-16 ENCOUNTER — Telehealth: Payer: Self-pay

## 2024-09-16 NOTE — Patient Instructions (Signed)
 [name] - I am sorry we were unable to complete our scheduled appointment today.   Your next care management appointment is by telephone on Wednesday, January 28 at 10:30 AM  Please call the care guide team at [phone] if you need to cancel, schedule, or reschedule an appointment.   Please call 1-800-273-TALK (toll free, 24 hour hotline) if you are experiencing a Mental Health or Behavioral Health Crisis or need someone to talk to.  Clayborne Ly RN BSN CCM [place]  [facility], [facility] Nurse Care Coordinator  Direct Dial: [phone] Website: [email or web address]

## 2024-09-17 ENCOUNTER — Other Ambulatory Visit: Payer: Self-pay | Admitting: Medical

## 2024-09-17 ENCOUNTER — Ambulatory Visit: Payer: Self-pay | Admitting: Medical

## 2024-09-17 LAB — URINE CULTURE

## 2024-09-17 MED ORDER — SULFAMETHOXAZOLE-TRIMETHOPRIM 800-160 MG PO TABS: 1.0000 | ORAL_TABLET | Freq: Two times a day (BID) | ORAL | 0 refills | Status: DC

## 2024-09-17 NOTE — Progress Notes (Signed)
 Urine culture abnormal.  I sent Bactrim  antibiotic to begin for urinary tract infection

## 2024-09-19 ENCOUNTER — Encounter (HOSPITAL_BASED_OUTPATIENT_CLINIC_OR_DEPARTMENT_OTHER): Payer: Self-pay | Admitting: Cardiology

## 2024-09-19 ENCOUNTER — Other Ambulatory Visit: Payer: Self-pay | Admitting: Medical

## 2024-09-19 MED ORDER — NITROFURANTOIN MONOHYD MACRO 100 MG PO CAPS: 100.0000 mg | ORAL_CAPSULE | Freq: Two times a day (BID) | ORAL | 0 refills | Status: AC

## 2024-09-19 NOTE — Progress Notes (Signed)
 Results through MyChart

## 2024-09-21 ENCOUNTER — Ambulatory Visit: Payer: Self-pay

## 2024-09-21 NOTE — Telephone Encounter (Signed)
" ° ° ° °  FYI Only or Action Required?: Action required by provider: update on patient condition.  Patient was last seen in primary care on 09/13/2024 by Bulah Alm RAMAN, PA-C.  Called Nurse Triage reporting Hypertension.  Symptoms began today.  Interventions attempted: Prescription medications: took BP meds .  Symptoms are: completely resolved.  Triage Disposition: See PCP Within 2 Weeks  Patient/caregiver understands and will follow disposition?: No, refuses dispositionMessage from Rover F sent at 09/21/2024  3:50 PM EST  Reason for Triage: [facility] is calling in because patient was having chest pains this morning and blood pressure reading 152/98. The chest pains are gone, but patient has a headache.   Reason for Disposition  [1] Systolic BP >= 130 OR Diastolic >= 80 AND [2] taking BP medications  Answer Assessment - Initial Assessment Questions 1. BLOOD PRESSURE: What is your blood pressure? Did you take at least two measurements 5 minutes apart?     152/98  118/84 2. ONSET: When did you take your blood pressure?     1545 1558 3. HOW: How did you take your blood pressure? (e.g., automatic home BP monitor, visiting nurse)     Visiting PT  4. HISTORY: Do you have a history of high blood pressure?     Yes  5. MEDICINES: Are you taking any medicines for blood pressure? Have you missed any doses recently?     Yes- missed am meds and took at 1430 6. OTHER SYMPTOMS: Do you have any symptoms? (e.g., blurred vision, chest pain, difficulty breathing, headache, weakness)     Transient  center chest pain this am 5 minutes went away on its had mild headache but has resolved  Protocols used: Blood Pressure - High-A-AH  "

## 2024-09-22 ENCOUNTER — Other Ambulatory Visit: Payer: Self-pay

## 2024-09-22 ENCOUNTER — Ambulatory Visit

## 2024-09-22 DIAGNOSIS — G4733 Obstructive sleep apnea (adult) (pediatric): Secondary | ICD-10-CM

## 2024-09-22 DIAGNOSIS — R7303 Prediabetes: Secondary | ICD-10-CM

## 2024-09-22 DIAGNOSIS — R29898 Other symptoms and signs involving the musculoskeletal system: Secondary | ICD-10-CM

## 2024-09-22 DIAGNOSIS — E66813 Obesity, class 3: Secondary | ICD-10-CM

## 2024-09-22 MED ORDER — ZEPBOUND 2.5 MG/0.5ML ~~LOC~~ SOAJ: 2.5000 mg | SUBCUTANEOUS | 0 refills | Status: AC

## 2024-09-29 ENCOUNTER — Telehealth: Payer: Self-pay

## 2024-09-29 ENCOUNTER — Ambulatory Visit

## 2024-09-29 ENCOUNTER — Telehealth: Payer: Self-pay | Admitting: Nurse Practitioner

## 2024-09-29 NOTE — Telephone Encounter (Signed)
 Copied from CRM [id number]. Topic: General - Other >> Sep 29, 2024  4:18 PM Drema MATSU wrote: Reason for CRM: [facility] has cancelled her physical therapy because someone advised that she didn't need the services and she does. Pt has a form for the appeal and needs doctor to help her fill it out. She is needing documentation from the doctor stating that she needs. Please call patient she doesn't know what to do.

## 2024-09-29 NOTE — Patient Instructions (Signed)
 Natasha [name] - I am sorry I was unable to reach you today for our scheduled appointment.   Your next care management appointment is by telephone on Thursday, February 12 at 11:00 AM  Please call the care guide team at [phone] if you need to cancel, schedule, or reschedule an appointment.   Please call 1-800-273-TALK (toll free, 24 hour hotline) if you are experiencing a Mental Health or Behavioral Health Crisis or need someone to talk to.  Clayborne Ly RN BSN CCM [place]  [facility], [facility] Nurse Care Coordinator  Direct Dial: [phone] Website: [email or web address]

## 2024-09-30 ENCOUNTER — Other Ambulatory Visit (INDEPENDENT_AMBULATORY_CARE_PROVIDER_SITE_OTHER)

## 2024-09-30 ENCOUNTER — Other Ambulatory Visit (HOSPITAL_BASED_OUTPATIENT_CLINIC_OR_DEPARTMENT_OTHER): Payer: Self-pay | Admitting: Nurse Practitioner

## 2024-09-30 DIAGNOSIS — Z86711 Personal history of pulmonary embolism: Secondary | ICD-10-CM

## 2024-09-30 DIAGNOSIS — I10 Essential (primary) hypertension: Secondary | ICD-10-CM

## 2024-09-30 NOTE — Telephone Encounter (Signed)
 I am not sure who cancelled the PT. We need to look into this. I do feel that she needs physical therapy in the home. She is unable to get out of the home without assistance and the swelling in her legs limits her ability to walk and function. At her last several visits she has been unable to ambulate or get up from the chair without significant assistance. She lives alone and needs this therapy to help regain strength and allow her to maintain her independence.   Adam, can you check on why PT was cancelled and place new orders for in home PT for weakness, lymphedema, and fall risk?

## 2024-09-30 NOTE — Progress Notes (Signed)
 "  09/30/2024 Name: NYLAH BUTKUS MRN: [id number] DOB: Mar 16, 1951  Chief Complaint  Patient presents with   Medication Management   Hypertension    RASHEIDA BRODEN is a 74 y.o. year old female who presented for a telephone visit.   They were referred to the pharmacist by the [facility] Complex Case Management program for assistance in managing complex medication management.    Subjective:  Care Team: Primary Care Provider: Oris Camie BRAVO, NP  Medication Access/Adherence  Current Pharmacy:  [facility] [phone] [address], [place] - [address] [place] [address] Phone: [phone] Fax: [phone]  [id number] - [place] - [address] [place] [phone] Phone: [phone] Fax: [phone]  [facility] [facility] [address] [place] [address] Phone: [phone] Fax: [phone]   Patient reports affordability concerns with their medications: No  Patient reports access/transportation concerns to their pharmacy: Yes  Patient reports adherence concerns with their medications:  No  Hypertension:  Current medications: Valsartan  320mg  daily, Spironolactone  25mg  daily Medications previously tried: Amlodipine   Patient does have a validated, automated, upper arm home BP cuff Current blood pressure readings readings: Reports seeing up and down readings when PT checks. Lost her BP machine to monitor at home, plans to purchase a new [employer] device this week for home monitoring Patient reports dizziness at times since starting spironolactone , particularly with movement Patient denies hypertensive symptoms including headache, chest pain, shortness of breath  Supplement Review -Patient reports taking the following: Glycothrive (active ingredient - cinnamon) Lymphatic Drainage (active ingredient - dandelion extract) Nutraceuticals Joint Defend (active ingredients - glucosamine  chondroitin, MSM, turmeric) Bioma probiotic - various probiotic bacteria Urolithin A       Objective:  Lab Results  Component Value Date   HGBA1C 5.7 (H) 06/07/2024    Lab Results  Component Value Date   CREATININE 0.71 06/07/2024   BUN 11 06/07/2024   NA 140 06/07/2024   K 4.2 06/07/2024   CL 103 06/07/2024   CO2 21 06/07/2024    Lab Results  Component Value Date   CHOL 161 11/26/2022   HDL 83 11/26/2022   LDLCALC 67 11/26/2022   TRIG 52 11/26/2022   CHOLHDL 2.1 10/11/2022    Medications Reviewed Today     Reviewed by Lionell Jon DEL, RPH (Pharmacist) on 09/30/24 at 1049  Med List Status: <None>   Medication Order Taking? Sig Documenting Provider Last Dose Status Informant  acetaminophen  (TYLENOL ) 650 MG CR tablet [id number]  Take 650 mg by mouth every 8 (eight) hours as needed for pain. [provider]  Active   AMBULATORY [facility] MEDICATION [id number]  Motorized Scooter based on insurance coverage. Early, Sara E, NP  Active   Cholecalciferol 50 MCG (2000 UT) CAPS [id number]  Take by mouth once a week. 2400 units [provider]  Active   diclofenac  Sodium (VOLTAREN ) 1 % GEL [id number]  APPLY 2 GRAMS TO AFFECTED AREA 4 TIMES A DAY Early, Camie BRAVO, NP  Active   Elastic Bandages & Supports (MEDICAL COMPRESSION STOCKINGS) MISC [id number]  Thigh high compression stockings for venous insufficiency and lymphedema. Please measure ankle, calf, and thigh circumference and leg length for fit. Brand per insurance. Requesting one pair. Early, Sara E, NP  Active   Elastic Bandages & Supports (MEDICAL COMPRESSION STOCKINGS) MISC [id number]  30-40 mmHg knee length compression stockings for venous insufficiency and lymphedema. Please measure ankle  and calf circumference and leg length for fit. Brand per insurance coverage. Requesting 3 pair. Early, Sara E, NP  Active   fluconazole  (DIFLUCAN ) 150 MG tablet [id number]  Take 1 tablet by mouth today, 1 tablet in 3 days  and the last tablet in 6 days. Early, Sara E, NP  Active   furosemide  (LASIX ) 20 MG tablet [id number] Yes Take 1 tablet (20 mg total) by mouth 2 (two) times daily. Early, Sara E, NP  Active   levothyroxine  (SYNTHROID ) 125 MCG tablet [id number] Yes Take 1 tablet (125 mcg total) by mouth daily. Early, Sara E, NP  Active   magnesium (MAGTAB) 84 MG ( ) TBCR SR tablet [id number] Yes Take 72 mg by mouth. [provider]  Active   Multiple Vitamin (MULTIVITAMIN WITH MINERALS) TABS tablet [id number] Yes Take 1 tablet by mouth daily. [provider]  Active Self  NONFORMULARY OR COMPOUNDED ITEM [id number]  Take 2 drops by mouth at bedtime. Soursop, moringa, seamoss, oregano, black seed oil [provider]  Active   OVER THE COUNTER MEDICATION [id number] Yes Take by mouth daily. Omega XL  Patient is taking 2 gel caps daily [provider]  Active            Med Note DELILA, ADAM D   Mon Jun 07, 2024 11:55 AM) Urolithin a 2000 mg  OVER THE COUNTER MEDICATION [id number]  Take by mouth daily. Osteo Bi Flex patient is taking 2 tablets daily [provider]  Active   OVER THE COUNTER MEDICATION [id number]  Take by mouth every other day. Lymph MD [provider]  Active   OVER THE COUNTER MEDICATION [id number]  Take by mouth once a week. Thyro Support [provider]  Active   OVER THE COUNTER MEDICATION [id number]  Take by mouth once a week. Ultra Collagen Complex [provider]  Active   rivaroxaban  (XARELTO ) 20 MG TABS tablet [id number]  Take 1 tablet (20 mg total) by mouth daily with supper. Early, Sara E, NP  Active   spironolactone  (ALDACTONE ) 25 MG tablet [id number]  Take 1 tablet (25 mg total) by mouth daily. Lonni Slain, MD  Active   tirzepatide  (ZEPBOUND ) 2.5 MG/0.5ML Pen [id number]  Inject 2.5 mg into the skin once a week. After 4 weeks, increase to 5mg . Early, Sara E, NP  Active   valsartan  (DIOVAN ) 320 MG tablet [id number]  Take 1  tablet (320 mg total) by mouth daily. Early, Sara E, NP  Active   Vitamin D , Ergocalciferol , (DRISDOL ) 1.25 MG (50000 UNIT) CAPS capsule [id number]  Take 1 capsule (50,000 Units total) by mouth every 7 (seven) days. Early, Sara E, NP  Active   Vitamin D -Vitamin K (VITAMIN K2-VITAMIN D3 PO) [id number]  Take by mouth daily. 10000 Units and [provider]  Active               Assessment/Plan:   Hypertension: - Currently uncontrolled - Reviewed long term cardiovascular and renal outcomes of uncontrolled blood pressure - Reviewed appropriate blood pressure monitoring technique and reviewed goal blood pressure. Recommended to check home blood pressure and heart rate daily and keep a log -Continue current med therapy, cautioned to change position slowly and stay hydrated to help with dizziness since starting spironolactone . Discussed that body may be adjusting to low BP -Reminded of f/u with PCP in 2 weeks  Supplements -Can continue the following: Glycothrive (active ingredient - cinnamon) Bioma probiotic - various probiotic bacteria -Advise against the following: Lymphatic Drainage (  active ingredient - dandelion extract) - can increase risk of bleed with xarelto  Nutraceuticals Joint Defend (active ingredients - glucosamine chondroitin, MSM, turmeric) - can increase risk of bleed with xarelto  - Recommend plain osteo bi flex WITHOUT TURMERIC Urolithin A -  not a lot of data or info on potential med interactions     Follow Up Plan: 4 weeks (has in office visit in 2 weeks)  Jon VEAR [place], PharmD Clinical Pharmacist [phone]  "

## 2024-10-06 ENCOUNTER — Ambulatory Visit

## 2024-10-12 ENCOUNTER — Ambulatory Visit: Payer: Self-pay | Admitting: Nurse Practitioner

## 2024-10-13 ENCOUNTER — Ambulatory Visit

## 2024-10-14 ENCOUNTER — Telehealth

## 2024-10-20 ENCOUNTER — Ambulatory Visit

## 2024-10-27 ENCOUNTER — Ambulatory Visit

## 2024-10-27 ENCOUNTER — Ambulatory Visit: Admitting: Occupational Therapy

## 2024-11-11 ENCOUNTER — Other Ambulatory Visit

## 2024-11-26 ENCOUNTER — Ambulatory Visit (HOSPITAL_BASED_OUTPATIENT_CLINIC_OR_DEPARTMENT_OTHER): Admitting: Cardiology

## 2025-03-08 ENCOUNTER — Ambulatory Visit: Payer: Self-pay
